# Patient Record
Sex: Male | Born: 1955
Health system: Southern US, Community
[De-identification: ages and names within clinical notes are randomized; demographics above are authoritative.]

## PROBLEM LIST (undated history)

## (undated) DIAGNOSIS — M1990 Unspecified osteoarthritis, unspecified site: Secondary | ICD-10-CM

## (undated) DIAGNOSIS — G709 Myoneural disorder, unspecified: Secondary | ICD-10-CM

## (undated) DIAGNOSIS — J449 Chronic obstructive pulmonary disease, unspecified: Secondary | ICD-10-CM

## (undated) DIAGNOSIS — K5792 Diverticulitis of intestine, part unspecified, without perforation or abscess without bleeding: Secondary | ICD-10-CM

## (undated) DIAGNOSIS — C801 Malignant (primary) neoplasm, unspecified: Secondary | ICD-10-CM

## (undated) DIAGNOSIS — Z87442 Personal history of urinary calculi: Secondary | ICD-10-CM

## (undated) DIAGNOSIS — G629 Polyneuropathy, unspecified: Secondary | ICD-10-CM

## (undated) DIAGNOSIS — J189 Pneumonia, unspecified organism: Secondary | ICD-10-CM

## (undated) DIAGNOSIS — N189 Chronic kidney disease, unspecified: Secondary | ICD-10-CM

## (undated) DIAGNOSIS — Z86010 Personal history of colonic polyps: Secondary | ICD-10-CM

## (undated) DIAGNOSIS — E785 Hyperlipidemia, unspecified: Secondary | ICD-10-CM

## (undated) DIAGNOSIS — I10 Essential (primary) hypertension: Secondary | ICD-10-CM

## (undated) HISTORY — DX: Myoneural disorder, unspecified: G70.9

## (undated) HISTORY — PX: PARTIAL NEPHRECTOMY: SHX414

## (undated) HISTORY — DX: Hyperlipidemia, unspecified: E78.5

## (undated) HISTORY — PX: COLON SURGERY: SHX602

## (undated) HISTORY — PX: COLONOSCOPY: SHX174

## (undated) HISTORY — PX: MOUTH SURGERY: SHX715

## (undated) HISTORY — DX: Personal history of colonic polyps: Z86.010

---

## 2009-11-29 DIAGNOSIS — J189 Pneumonia, unspecified organism: Secondary | ICD-10-CM

## 2009-11-29 HISTORY — DX: Pneumonia, unspecified organism: J18.9

## 2009-11-29 HISTORY — PX: TOOTH EXTRACTION: SUR596

## 2010-07-31 ENCOUNTER — Ambulatory Visit: Payer: Self-pay | Admitting: Internal Medicine

## 2010-07-31 ENCOUNTER — Inpatient Hospital Stay (HOSPITAL_COMMUNITY): Admission: EM | Admit: 2010-07-31 | Discharge: 2010-08-06 | Payer: Self-pay | Admitting: Emergency Medicine

## 2010-08-02 ENCOUNTER — Encounter (INDEPENDENT_AMBULATORY_CARE_PROVIDER_SITE_OTHER): Payer: Self-pay | Admitting: Internal Medicine

## 2010-08-05 ENCOUNTER — Ambulatory Visit: Payer: Self-pay | Admitting: Dentistry

## 2010-08-06 ENCOUNTER — Encounter (INDEPENDENT_AMBULATORY_CARE_PROVIDER_SITE_OTHER): Payer: Self-pay | Admitting: Nurse Practitioner

## 2010-08-10 ENCOUNTER — Telehealth (INDEPENDENT_AMBULATORY_CARE_PROVIDER_SITE_OTHER): Payer: Self-pay | Admitting: Nurse Practitioner

## 2010-08-18 ENCOUNTER — Encounter: Admission: AD | Admit: 2010-08-18 | Discharge: 2010-08-18 | Payer: Self-pay | Admitting: Dentistry

## 2010-08-19 DIAGNOSIS — M1990 Unspecified osteoarthritis, unspecified site: Secondary | ICD-10-CM | POA: Insufficient documentation

## 2010-08-19 DIAGNOSIS — D649 Anemia, unspecified: Secondary | ICD-10-CM | POA: Insufficient documentation

## 2010-08-19 DIAGNOSIS — J852 Abscess of lung without pneumonia: Secondary | ICD-10-CM | POA: Insufficient documentation

## 2010-08-19 DIAGNOSIS — F17200 Nicotine dependence, unspecified, uncomplicated: Secondary | ICD-10-CM | POA: Insufficient documentation

## 2010-08-19 DIAGNOSIS — E8809 Other disorders of plasma-protein metabolism, not elsewhere classified: Secondary | ICD-10-CM | POA: Insufficient documentation

## 2010-08-20 ENCOUNTER — Ambulatory Visit: Payer: Self-pay | Admitting: Internal Medicine

## 2010-08-20 DIAGNOSIS — Z9189 Other specified personal risk factors, not elsewhere classified: Secondary | ICD-10-CM | POA: Insufficient documentation

## 2010-08-20 DIAGNOSIS — R609 Edema, unspecified: Secondary | ICD-10-CM | POA: Insufficient documentation

## 2010-08-21 LAB — CONVERTED CEMR LAB
BUN: 11 mg/dL (ref 6–23)
Basophils Absolute: 0.1 10*3/uL (ref 0.0–0.1)
Basophils Relative: 1.1 % (ref 0.0–3.0)
CO2: 28 meq/L (ref 19–32)
Calcium: 9.2 mg/dL (ref 8.4–10.5)
Chloride: 102 meq/L (ref 96–112)
Creatinine, Ser: 1 mg/dL (ref 0.4–1.5)
Eosinophils Absolute: 0.4 10*3/uL (ref 0.0–0.7)
Eosinophils Relative: 3.5 % (ref 0.0–5.0)
GFR calc non Af Amer: 82.08 mL/min (ref >60)
Glucose, Bld: 84 mg/dL (ref 70–99)
HCT: 37.6 % — ABNORMAL LOW (ref 39.0–52.0)
Hemoglobin: 13.1 g/dL (ref 13.0–17.0)
Lymphocytes Relative: 40.6 % (ref 12.0–46.0)
Lymphs Abs: 4.9 10*3/uL — ABNORMAL HIGH (ref 0.7–4.0)
MCHC: 34.8 g/dL (ref 30.0–36.0)
MCV: 90.3 fL (ref 78.0–100.0)
Monocytes Absolute: 0.9 10*3/uL (ref 0.1–1.0)
Monocytes Relative: 7.2 % (ref 3.0–12.0)
Neutro Abs: 5.8 10*3/uL (ref 1.4–7.7)
Neutrophils Relative %: 47.6 % (ref 43.0–77.0)
Platelets: 547 10*3/uL — ABNORMAL HIGH (ref 150.0–400.0)
Potassium: 5.4 meq/L — ABNORMAL HIGH (ref 3.5–5.1)
RBC: 4.16 M/uL — ABNORMAL LOW (ref 4.22–5.81)
RDW: 16.4 % — ABNORMAL HIGH (ref 11.5–14.6)
Sodium: 139 meq/L (ref 135–145)
WBC: 12.2 10*3/uL — ABNORMAL HIGH (ref 4.5–10.5)

## 2010-08-28 ENCOUNTER — Ambulatory Visit (HOSPITAL_COMMUNITY): Admission: RE | Admit: 2010-08-28 | Discharge: 2010-08-28 | Payer: Self-pay | Admitting: Dentistry

## 2010-09-07 ENCOUNTER — Ambulatory Visit: Payer: Self-pay | Admitting: Internal Medicine

## 2010-10-07 ENCOUNTER — Ambulatory Visit: Payer: Self-pay | Admitting: Dentistry

## 2010-10-20 ENCOUNTER — Ambulatory Visit: Payer: Self-pay | Admitting: Internal Medicine

## 2010-10-20 DIAGNOSIS — I10 Essential (primary) hypertension: Secondary | ICD-10-CM | POA: Insufficient documentation

## 2010-12-29 NOTE — Assessment & Plan Note (Signed)
Summary: Pulmonary/ f/u R lung pna, placed in tickle for repeat at 3 mo   Primary Provider/Referring Provider:  n/a  CC:  Edema- resolved.  History of Present Illness: 54 yowm active smoker admit 07/31/10 with pna ? necrotizing.   Admit 9/2 - 9/8 for  pna sup segment RLL and pos basal  attributed to asp with bad teeth rx with 14 days of augmentin with w/u for edema neg for dvt, nl tsh and bnp but low albumin  August 20, 2010  1st pulmonary office eval Pt c/o bilateral leg edema with redness, minimallyproductive cough now white mucus. Stop aleve Elevate leg higher than heart while sleeping and if not better call for repeat venous doppler on left (call [facility] at [phone])  September 07, 2010 2 wk follow up.  Pt states he is 75-80% better and swelling has resolved. Occ nonprod cough but relates this it recent dental sugery.    October 20, 2010 ov still smoking but no cough or sob or leg swelling. has been told has borderline hbp, uses lots of salt in diet. no cp/ ha  Preventive Screening-Counseling & Management  Alcohol-Tobacco     Smoking Status: current     Smoking Cessation Counseling: yes  Current Medications (verified): 1)  Mucinex 600 Mg Xr12h-Tab (Guaifenesin) .Marland Kitchen.. 1-2 Every 12 Hours As Needed For Cough  Allergies (verified): No Known Drug Allergies  Past History:  Past Medical History: OSTEOARTHRITIS (ICD-715.90) HYPOALBUMINEMIA (ICD-273.8) ANEMIA (ICD-285.9) NICOTINE ADDICTION (ICD-305.1) NECROTIZING PNEUMONIA (ICD-513.0)      - Admit 07/31/10 rx augmentin x 14 days  >> better September 07, 2010       >> October 20, 2010 better but still abn < recheck in 3 months (tickle file) Hospitalized with Encephalitis in third grade Leg swelling     - neg bilateral venous dopplers 08/02/10      Dr. Jolee Ewing, DDS  Vital Signs:  Patient profile:   55 year old male Weight:      296 pounds O2 Sat:      98 % on Room air Temp:     98.1 degrees F oral Pulse rate:   89  / minute BP sitting:   148 / 96  (left arm)  Vitals Entered By: Vernie Murders (October 20, 2010 9:32 AM)  O2 Flow:  Room air  Physical Exam  Additional Exam:  amb wm nad wt  297   August 20, 2010 > 296 October 20, 2010  HEENT mild turbinate edema.  Oropharynx now edentulous,  no thrush or excess pnd or cobblestoning.  No JVD or cervical adenopathy. Mild accessory muscle hypertrophy. Trachea midline, nl thryroid. Chest was hyperinflated by percussion with diminished breath sounds and moderate increased exp time without wheeze. Hoover sign positive at mid inspiration. Regular rate and rhythm without murmur gallop or rub or increase P2 , no edema.   Abd: no hsm, nl excursion. Ext warm without cyanosis or clubbing.     CXR  Procedure date:  10/20/2010  Findings:      Improved right basilar airspace disease.  Impression & Recommendations:  Problem # 1:  NECROTIZING PNEUMONIA (ICD-513.0)  Marked serial improvement, needs f/u in 3 months (placed in tickle file)  Problem # 2:  HYPERTENSION, BENIGN (ICD-401.1)  borderline, admits salt excess.  discussed  Orders: Est. Patient Level II (16109)  Other Orders: T-2 View CXR (71020TC)  Patient Instructions: 1)  Avoid all salt and cigarettes 2)  We will you if further follow  up here is needed 3)  Consider medical followup at [facility]

## 2010-12-29 NOTE — Letter (Signed)
Summary: Discharge Summary  Discharge Summary   Imported By: Arta Bruce 10/19/2010 14:48:04  _____________________________________________________________________  External Attachment:    Type:   Image     Comment:   External Document

## 2010-12-29 NOTE — Assessment & Plan Note (Signed)
Summary: Pulmonary/ post hosp f/u   Visit Type:  Hospital Follow-up Primary Provider/Referring Provider:  n/a  CC:  Pt c/o bilateral leg edema with redness, productive cough, and .  History of Present Illness: 55 yowm active smoker admit 07/31/10 with pna ? necrotizing.   Admit 9/2 - 9/8 for  pna sup segment RLL and pos basal  attributed to asp with bad teeth rx with 14 days of augmentin with w/u for edema neg for dvt, nl tsh and bnp but low albumin  August 20, 2010  1st pulmonary office eval Pt c/o bilateral leg edema with redness, minimallyproductive cough now white mucus.  Pt denies any significant sore throat, dysphagia, itching, sneezing,  nasal congestion or excess secretions,  fever, chills, sweats, unintended wt loss, pleuritic or exertional cp, hempoptysis, change in activity tolerance  orthopnea pnd or leg swelling    Preventive Screening-Counseling & Management  Alcohol-Tobacco     Alcohol drinks/day: 0     Smoking Status: current     Packs/Day: <0.25     Year Started: 1978  Current Medications (verified): 1)  Nicoderm Cq 21 Mg/24hr Pt24 (Nicotine) .... As Directed 2)  Ferrous Sulfate 325 (65 Fe) Mg Tabs (Ferrous Sulfate) .... Take 1 Tablet By Mouth Once A Day 3)  Mucinex 600 Mg Xr12h-Tab (Guaifenesin) .... ****not Taking******take 2 Tabs By Mouth Two Times A Day 4)  Hydrocodone-Acetaminophen 5-325 Mg Tabs (Hydrocodone-Acetaminophen) .... ******not Taking********1 Tab By Mouth Every 4 To 6 Hours As Needed For Pain 5)  Aleve 220 Mg Tabs (Naproxen Sodium) .... As Directed 6)  Tussionex Pennkinetic Er 10-8 Mg/47ml Lqcr (Hydrocod Polst-Chlorphen Polst) .... *********not Taking********5 Cc Every 12 Hours As Needed For Cough 7)  Ventolin Hfa 108 (90 Base) Mcg/act  Aers (Albuterol Sulfate) .... ********not Taking******1-2 Puffs Every 4-6 Hours As Needed  Allergies (verified): No Known Drug Allergies  Past History:  Past Medical History: OSTEOARTHRITIS  (ICD-715.90) HYPOALBUMINEMIA (ICD-273.8) ANEMIA (ICD-285.9) NICOTINE ADDICTION (ICD-305.1) NECROTIZING PNEUMONIA (ICD-513.0)      - Admit 07/31/10 rx augmentin x 14 days  Hospitalized with Encephalitis in third grade Leg swelling     - neg bilateral venous dopplers 08/02/10      Dr. Jolee Ewing, DDS  Past Surgical History: Oral (dental) surgery WISDOM TEETH EXTRACTION, HX OF (ICD-V15.9)  Social History: Smoking Status:  current Packs/Day:  <0.25 Alcohol drinks/day:  0  Vital Signs:  Patient profile:   55 year old male Height:      80.5 inches Weight:      297.25 pounds BMI:     32.37 O2 Sat:      98 % on Room air Pulse rate:   97 / minute BP sitting:   128 / 80  (left arm) Cuff size:   large  Vitals Entered By: Zackery Barefoot CMA (August 20, 2010 2:44 PM)  O2 Flow:  Room air CC: Pt c/o bilateral leg edema with redness, productive cough,  Comments Medications reviewed with patient Verified contact number and pharmacy with patient Zackery Barefoot [facility]  August 20, 2010 2:44 PM    Physical Exam  Additional Exam:  amb wm nad wt  297   August 20, 2010 HEENT mild turbinate edema.  Oropharynx no thrush or excess pnd or cobblestoning.  No JVD or cervical adenopathy. Mild accessory muscle hypertrophy. Trachea midline, nl thryroid. Chest was hyperinflated by percussion with diminished breath sounds and moderate increased exp time without wheeze. Hoover sign positive at mid inspiration. Regular rate and rhythm without  murmur gallop or rub or increase P2  trace edema right, 1-2+ on left.   Abd: no hsm, nl excursion. Ext warm without cyanosis or clubbing.        Sodium                    139 mEq/L                   135-145   Potassium            [H]  5.4 mEq/L                   3.5-5.1   Chloride                  102 mEq/L                   96-112   Carbon Dioxide            28 mEq/L                    19-32   Glucose                   84 mg/dL                     19-14   BUN                       11 mg/dL                    7-82   Creatinine                1.0 mg/dL                   9.5-6.2   Calcium                   9.2 mg/dL                   1.3-08.6   GFR                       82.08 mL/min                >60  Tests: (2) CBC Platelet w/Diff (CBCD)   White Cell Count     [H]  12.2 K/uL                   4.5-10.5   Red Cell Count       [L]  4.16 Mil/uL                 4.22-5.81   Hemoglobin                13.1 g/dL                   57.8-46.9   Hematocrit           [L]  37.6 %                      39.0-52.0   MCV                       90.3 fl  78.0-100.0   MCHC                      34.8 g/dL                   16.1-09.6   RDW                  [H]  16.4 %                      11.5-14.6   Platelet Count       [H]  547.0 K/uL                  150.0-400.0   Neutrophil %              47.6 %                      43.0-77.0   Lymphocyte %              40.6 %                      12.0-46.0   Monocyte %                7.2 %                       3.0-12.0   Eosinophils%              3.5 %                       0.0-5.0   Basophils %               1.1 %                       0.0-3.0   Neutrophill Absolute      5.8 K/uL                    1.4-7.7   Lymphocyte Absolute  [H]  4.9 K/uL                    0.7-4.0   Monocyte Absolute         0.9 K/uL                    0.1-1.0  Eosinophils, Absolute                             0.4 K/uL                    0.0-0.7   Basophils Absolute        0.1 K/uL                    0.0-0.1  CXR  Procedure date:  08/04/2010  Findings:        Improving right lower lobe pulmonary opacities compatible with   previously diagnosed pneumonia, and new right pleural effusion.   Please note radiographic resolution of pneumonia may take 4-6   weeks.   Impression & Recommendations:  Problem # 1:  NECROTIZING PNEUMONIA (ICD-513.0) Gradually improving, needs f/u cxr   Problem # 2:  NICOTINE ADDICTION  (ICD-305.1)  His updated medication list for this problem includes:  Nicoderm Cq 21 Mg/24hr Pt24 (Nicotine) .Marland Kitchen... As directed   I emphasized that although we never turn away smokers from the pulmonary clinic, we do ask that they understand that the recommendations that were made won't work nearly as well in the presence of continued cigarette exposure and we may reach a point where we can't help the patient if he/she can't quit smoking.    Orders: Est. Patient Level III (16010)  Problem # 3:  EDEMA (ICD-782.3) w/u reviewed, prob related to low alb and use of nsaids.  no evidence dvt or renal failure or chf.  See instructions for specific recommendations   Medications Added to Medication List This Visit: 1)  Nicoderm Cq 21 Mg/24hr Pt24 (Nicotine) .... As directed 2)  Ferrous Sulfate 325 (65 Fe) Mg Tabs (Ferrous sulfate) .... Take 1 tablet by mouth once a day 3)  Mucinex 600 Mg Xr12h-tab (Guaifenesin) .Marland Kitchen.. 1-2 every 12 hours as needed for cough 4)  Mucinex 600 Mg Xr12h-tab (Guaifenesin) .... ****not taking******take 2 tabs by mouth two times a day 5)  Hydrocodone-acetaminophen 5-325 Mg Tabs (Hydrocodone-acetaminophen) .... One every 4 hours as needed 6)  Hydrocodone-acetaminophen 5-325 Mg Tabs (Hydrocodone-acetaminophen) .... ******not taking********1 tab by mouth every 4 to 6 hours as needed for pain 7)  Aleve 220 Mg Tabs (Naproxen sodium) .... As directed 8)  Tussionex Pennkinetic Er 10-8 Mg/84ml Lqcr (Hydrocod polst-chlorphen polst) .... *********not taking********5 cc every 12 hours as needed for cough 9)  Ventolin Hfa 108 (90 Base) Mcg/act Aers (Albuterol sulfate) .... ********not taking******1-2 puffs every 4-6 hours as needed  Other Orders: Misc. Referral (Misc. Ref) TLB-BMP (Basic Metabolic Panel-BMET) (80048-METABOL) TLB-CBC Platelet - w/Differential (85025-CBCD)  Patient Instructions: 1)  Stop aleve 2)  Elevate leg higher than heart while sleeping and if not better call for  repeat venous doppler on left (call [facility] at [phone]) 3)  Please schedule a follow-up appointment in 2 weeks, sooner if needed  4)  YOU ARE CLEARED FOR ORAL SURGERY Prescriptions: HYDROCODONE-ACETAMINOPHEN 5-325 MG TABS (HYDROCODONE-ACETAMINOPHEN) one every 4 hours as needed  #30 x 0   Entered and Authorized by:   Nyoka Cowden MD   Signed by:   Nyoka Cowden MD on 08/20/2010   Method used:   Print then Give to Patient   RxID:   [id number]    Not Administered:    Influenza Vaccine not given due to: declined
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
## 2010-12-29 NOTE — Assessment & Plan Note (Signed)
Summary: Pulmonary/ f/u necrotizing pna, improved, in tickle file for 6 w   Primary Provider/Referring Provider:  n/a  CC:  2 wk follow up.  Pt states he is 75-80% better and swelling has resolved. Occ nonprod cough but relates this it recent dental sugery.  Denies SOB, wheezing, and chst tightness.  Marland Kitchen  History of Present Illness: 24 yowm active smoker admit 07/31/10 with pna ? necrotizing.   Admit 9/2 - 9/8 for  pna sup segment RLL and pos basal  attributed to asp with bad teeth rx with 14 days of augmentin with w/u for edema neg for dvt, nl tsh and bnp but low albumin  August 20, 2010  1st pulmonary office eval Pt c/o bilateral leg edema with redness, minimallyproductive cough now white mucus. Stop aleve Elevate leg higher than heart while sleeping and if not better call for repeat venous doppler on left (call Libby at [phone])  September 07, 2010 2 wk follow up.  Pt states he is 75-80% better and swelling has resolved. Occ nonprod cough but relates this it recent dental sugery.  Denies SOB, wheezing, chst tightness.   Not limited by breathing.   Preventive Screening-Counseling & Management  Alcohol-Tobacco     Smoking Status: current     Smoking Cessation Counseling: yes  Current Medications (verified): 1)  Nicoderm Cq 21 Mg/24hr Pt24 (Nicotine) .... As Directed 2)  Ferrous Sulfate 325 (65 Fe) Mg Tabs (Ferrous Sulfate) .... Take 1 Tablet By Mouth Once A Day 3)  Mucinex 600 Mg Xr12h-Tab (Guaifenesin) .Marland Kitchen.. 1-2 Every 12 Hours As Needed For Cough  Allergies (verified): No Known Drug Allergies  Past History:  Past Medical History: OSTEOARTHRITIS (ICD-715.90) HYPOALBUMINEMIA (ICD-273.8) ANEMIA (ICD-285.9) NICOTINE ADDICTION (ICD-305.1) NECROTIZING PNEUMONIA (ICD-513.0)      - Admit 07/31/10 rx augmentin x 14 days  >> better September 07, 2010 but needs 6 week f/u (tickle) Hospitalized with Encephalitis in third grade Leg swelling     - neg bilateral venous dopplers 08/02/10       Dr. Jolee Ewing, DDS  Vital Signs:  Patient profile:   55 year old male Height:      80.5 inches Weight:      292.38 pounds BMI:     31.84 O2 Sat:      99 % on Room air Temp:     98.1 degrees F oral Pulse rate:   72 / minute BP sitting:   112 / 84  (left arm) Cuff size:   large  Vitals Entered By: Gweneth Dimitri RN (September 07, 2010 11:29 AM)  O2 Flow:  Room air CC: 2 wk follow up.  Pt states he is 75-80% better and swelling has resolved. Occ nonprod cough but relates this it recent dental sugery.  Denies SOB, wheezing, chst tightness.   Comments Medications reviewed with patient Daytime contact number verified with patient. Gweneth Dimitri RN  September 07, 2010 11:29 AM    Physical Exam  Additional Exam:  amb wm nad wt  297   August 20, 2010 > 292 HEENT mild turbinate edema.  Oropharynx now edentulous,  no thrush or excess pnd or cobblestoning.  No JVD or cervical adenopathy. Mild accessory muscle hypertrophy. Trachea midline, nl thryroid. Chest was hyperinflated by percussion with diminished breath sounds and moderate increased exp time without wheeze. Hoover sign positive at mid inspiration. Regular rate and rhythm without murmur gallop or rub or increase P2  trace edema right, 1-2+ on left.   Abd: no  hsm, nl excursion. Ext warm without cyanosis or clubbing.     CXR  Procedure date:  09/07/2010  Findings:      Comparison: 08/04/2010 and earlier   Findings: Some interval improvement, but incomplete resolution of right lower lobe airspace densities. Continued follow-up warranted. Left lung remains clear.  No significant pleural fluid.   Heart and vascularity remain normal.   IMPRESSION: Right lower lobe opacities shows some interval improvement, incomplete resolution.  Continued follow-up needed.  Impression & Recommendations:  Problem # 1:  NECROTIZING PNEUMONIA (ICD-513.0) Marked serial improvement, needs f/u in 6 weeks (placed in tickle  file)  Other Orders: T-2 View CXR (71020TC) Est. Patient Level II (16109)  Patient Instructions: 1)  we'll call you the results of your chest xray

## 2011-01-19 ENCOUNTER — Telehealth (INDEPENDENT_AMBULATORY_CARE_PROVIDER_SITE_OTHER): Payer: Self-pay | Admitting: *Deleted

## 2011-01-21 ENCOUNTER — Ambulatory Visit: Payer: Self-pay | Admitting: Internal Medicine

## 2011-01-22 ENCOUNTER — Telehealth: Payer: Self-pay | Admitting: Internal Medicine

## 2011-01-26 NOTE — Progress Notes (Signed)
Summary: 3 month rov with cxr sched  ---- Converted from flag ---- ---- 10/20/2010 6:57 PM, Nyoka Cowden MD wrote: needs f/u ov with cxr ------------------------------  Spoke with pt and notified needs appt with cxr.  Pt okay with this so sched appt for 01/21/11 at 1:45 pm [name]  January 19, 2011 10:53 AM

## 2011-01-26 NOTE — Progress Notes (Signed)
Summary: nos appt  Phone Note Call from Patient   Caller: [email or web address]  Call For: wert Summary of Call: Rsc nos from 2/23 to 3/14. Initial call taken by: Darletta Moll,  January 22, 2011 9:03 AM

## 2011-02-10 ENCOUNTER — Ambulatory Visit: Payer: Self-pay | Admitting: Internal Medicine

## 2011-02-11 ENCOUNTER — Telehealth: Payer: Self-pay | Admitting: Internal Medicine

## 2011-02-11 LAB — CBC
HCT: 28.1 % — ABNORMAL LOW (ref 39.0–52.0)
HCT: 29.9 % — ABNORMAL LOW (ref 39.0–52.0)
HCT: 30.3 % — ABNORMAL LOW (ref 39.0–52.0)
HCT: 31.8 % — ABNORMAL LOW (ref 39.0–52.0)
HCT: 31.9 % — ABNORMAL LOW (ref 39.0–52.0)
HCT: 32.1 % — ABNORMAL LOW (ref 39.0–52.0)
HCT: 33.2 % — ABNORMAL LOW (ref 39.0–52.0)
Hemoglobin: 10.2 g/dL — ABNORMAL LOW (ref 13.0–17.0)
Hemoglobin: 10.5 g/dL — ABNORMAL LOW (ref 13.0–17.0)
Hemoglobin: 10.8 g/dL — ABNORMAL LOW (ref 13.0–17.0)
Hemoglobin: 10.9 g/dL — ABNORMAL LOW (ref 13.0–17.0)
Hemoglobin: 10.9 g/dL — ABNORMAL LOW (ref 13.0–17.0)
Hemoglobin: 11.6 g/dL — ABNORMAL LOW (ref 13.0–17.0)
Hemoglobin: 9.6 g/dL — ABNORMAL LOW (ref 13.0–17.0)
MCH: 30.1 pg (ref 26.0–34.0)
MCH: 30.3 pg (ref 26.0–34.0)
MCH: 30.3 pg (ref 26.0–34.0)
MCH: 30.5 pg (ref 26.0–34.0)
MCH: 30.5 pg (ref 26.0–34.0)
MCH: 30.6 pg (ref 26.0–34.0)
MCH: 30.7 pg (ref 26.0–34.0)
MCHC: 33.8 g/dL (ref 30.0–36.0)
MCHC: 33.9 g/dL (ref 30.0–36.0)
MCHC: 34 g/dL (ref 30.0–36.0)
MCHC: 34.2 g/dL (ref 30.0–36.0)
MCHC: 34.2 g/dL (ref 30.0–36.0)
MCHC: 34.6 g/dL (ref 30.0–36.0)
MCHC: 34.9 g/dL (ref 30.0–36.0)
MCV: 87.6 fL (ref 78.0–100.0)
MCV: 88 fL (ref 78.0–100.0)
MCV: 88.9 fL (ref 78.0–100.0)
MCV: 89 fL (ref 78.0–100.0)
MCV: 89.3 fL (ref 78.0–100.0)
MCV: 89.4 fL (ref 78.0–100.0)
MCV: 89.7 fL (ref 78.0–100.0)
Platelets: 531 10*3/uL — ABNORMAL HIGH (ref 150–400)
Platelets: 549 10*3/uL — ABNORMAL HIGH (ref 150–400)
Platelets: 580 10*3/uL — ABNORMAL HIGH (ref 150–400)
Platelets: 593 10*3/uL — ABNORMAL HIGH (ref 150–400)
Platelets: 607 10*3/uL — ABNORMAL HIGH (ref 150–400)
Platelets: 642 10*3/uL — ABNORMAL HIGH (ref 150–400)
Platelets: 648 10*3/uL — ABNORMAL HIGH (ref 150–400)
RBC: 3.15 MIL/uL — ABNORMAL LOW (ref 4.22–5.81)
RBC: 3.33 MIL/uL — ABNORMAL LOW (ref 4.22–5.81)
RBC: 3.45 MIL/uL — ABNORMAL LOW (ref 4.22–5.81)
RBC: 3.57 MIL/uL — ABNORMAL LOW (ref 4.22–5.81)
RBC: 3.57 MIL/uL — ABNORMAL LOW (ref 4.22–5.81)
RBC: 3.59 MIL/uL — ABNORMAL LOW (ref 4.22–5.81)
RBC: 3.77 MIL/uL — ABNORMAL LOW (ref 4.22–5.81)
RDW: 14 % (ref 11.5–15.5)
RDW: 14.1 % (ref 11.5–15.5)
RDW: 14.3 % (ref 11.5–15.5)
RDW: 14.3 % (ref 11.5–15.5)
RDW: 14.5 % (ref 11.5–15.5)
RDW: 14.5 % (ref 11.5–15.5)
RDW: 14.7 % (ref 11.5–15.5)
WBC: 10.1 10*3/uL (ref 4.0–10.5)
WBC: 12.5 10*3/uL — ABNORMAL HIGH (ref 4.0–10.5)
WBC: 12.8 10*3/uL — ABNORMAL HIGH (ref 4.0–10.5)
WBC: 14.3 10*3/uL — ABNORMAL HIGH (ref 4.0–10.5)
WBC: 15.9 10*3/uL — ABNORMAL HIGH (ref 4.0–10.5)
WBC: 17 10*3/uL — ABNORMAL HIGH (ref 4.0–10.5)
WBC: 19.7 10*3/uL — ABNORMAL HIGH (ref 4.0–10.5)

## 2011-02-11 LAB — BASIC METABOLIC PANEL
BUN: 4 mg/dL — ABNORMAL LOW (ref 6–23)
BUN: 6 mg/dL (ref 6–23)
BUN: 6 mg/dL (ref 6–23)
BUN: 7 mg/dL (ref 6–23)
BUN: 8 mg/dL (ref 6–23)
CO2: 29 mEq/L (ref 19–32)
CO2: 29 mEq/L (ref 19–32)
CO2: 30 mEq/L (ref 19–32)
CO2: 30 mEq/L (ref 19–32)
CO2: 30 mEq/L (ref 19–32)
Calcium: 7.8 mg/dL — ABNORMAL LOW (ref 8.4–10.5)
Calcium: 7.9 mg/dL — ABNORMAL LOW (ref 8.4–10.5)
Calcium: 8.1 mg/dL — ABNORMAL LOW (ref 8.4–10.5)
Calcium: 8.2 mg/dL — ABNORMAL LOW (ref 8.4–10.5)
Calcium: 8.2 mg/dL — ABNORMAL LOW (ref 8.4–10.5)
Chloride: 100 mEq/L (ref 96–112)
Chloride: 103 mEq/L (ref 96–112)
Chloride: 103 mEq/L (ref 96–112)
Chloride: 91 mEq/L — ABNORMAL LOW (ref 96–112)
Chloride: 94 mEq/L — ABNORMAL LOW (ref 96–112)
Creatinine, Ser: 0.73 mg/dL (ref 0.4–1.5)
Creatinine, Ser: 0.76 mg/dL (ref 0.4–1.5)
Creatinine, Ser: 0.79 mg/dL (ref 0.4–1.5)
Creatinine, Ser: 0.8 mg/dL (ref 0.4–1.5)
Creatinine, Ser: 0.81 mg/dL (ref 0.4–1.5)
GFR calc Af Amer: >60 mL/min (ref >60)
GFR calc Af Amer: >60 mL/min (ref >60)
GFR calc Af Amer: >60 mL/min (ref >60)
GFR calc Af Amer: >60 mL/min (ref >60)
GFR calc Af Amer: >60 mL/min (ref >60)
GFR calc non Af Amer: >60 mL/min (ref >60)
GFR calc non Af Amer: >60 mL/min (ref >60)
GFR calc non Af Amer: >60 mL/min (ref >60)
GFR calc non Af Amer: >60 mL/min (ref >60)
GFR calc non Af Amer: >60 mL/min (ref >60)
Glucose, Bld: 107 mg/dL — ABNORMAL HIGH (ref 70–99)
Glucose, Bld: 110 mg/dL — ABNORMAL HIGH (ref 70–99)
Glucose, Bld: 114 mg/dL — ABNORMAL HIGH (ref 70–99)
Glucose, Bld: 117 mg/dL — ABNORMAL HIGH (ref 70–99)
Glucose, Bld: 96 mg/dL (ref 70–99)
Potassium: 3.2 mEq/L — ABNORMAL LOW (ref 3.5–5.1)
Potassium: 3.3 mEq/L — ABNORMAL LOW (ref 3.5–5.1)
Potassium: 3.5 mEq/L (ref 3.5–5.1)
Potassium: 3.6 mEq/L (ref 3.5–5.1)
Potassium: 3.6 mEq/L (ref 3.5–5.1)
Sodium: 130 mEq/L — ABNORMAL LOW (ref 135–145)
Sodium: 132 mEq/L — ABNORMAL LOW (ref 135–145)
Sodium: 137 mEq/L (ref 135–145)
Sodium: 137 mEq/L (ref 135–145)
Sodium: 140 mEq/L (ref 135–145)

## 2011-02-11 LAB — DIFFERENTIAL
Basophils Absolute: 0.1 10*3/uL (ref 0.0–0.1)
Basophils Absolute: 0.1 10*3/uL (ref 0.0–0.1)
Basophils Absolute: 0.2 10*3/uL — ABNORMAL HIGH (ref 0.0–0.1)
Basophils Absolute: 0.2 10*3/uL — ABNORMAL HIGH (ref 0.0–0.1)
Basophils Relative: 1 % (ref 0–1)
Basophils Relative: 1 % (ref 0–1)
Basophils Relative: 1 % (ref 0–1)
Basophils Relative: 2 % — ABNORMAL HIGH (ref 0–1)
Eosinophils Absolute: 0.1 10*3/uL (ref 0.0–0.7)
Eosinophils Absolute: 0.2 10*3/uL (ref 0.0–0.7)
Eosinophils Absolute: 0.3 10*3/uL (ref 0.0–0.7)
Eosinophils Absolute: 0.4 10*3/uL (ref 0.0–0.7)
Eosinophils Relative: 0 % (ref 0–5)
Eosinophils Relative: 1 % (ref 0–5)
Eosinophils Relative: 2 % (ref 0–5)
Eosinophils Relative: 3 % (ref 0–5)
Lymphocytes Relative: 17 % (ref 12–46)
Lymphocytes Relative: 17 % (ref 12–46)
Lymphocytes Relative: 20 % (ref 12–46)
Lymphocytes Relative: 23 % (ref 12–46)
Lymphs Abs: 2.2 10*3/uL (ref 0.7–4.0)
Lymphs Abs: 2.4 10*3/uL (ref 0.7–4.0)
Lymphs Abs: 3.2 10*3/uL (ref 0.7–4.0)
Lymphs Abs: 4.6 10*3/uL — ABNORMAL HIGH (ref 0.7–4.0)
Monocytes Absolute: 0.6 10*3/uL (ref 0.1–1.0)
Monocytes Absolute: 0.7 10*3/uL (ref 0.1–1.0)
Monocytes Absolute: 0.9 10*3/uL (ref 0.1–1.0)
Monocytes Absolute: 1.2 10*3/uL — ABNORMAL HIGH (ref 0.1–1.0)
Monocytes Relative: 5 % (ref 3–12)
Monocytes Relative: 5 % (ref 3–12)
Monocytes Relative: 6 % (ref 3–12)
Monocytes Relative: 6 % (ref 3–12)
Neutro Abs: 10.6 10*3/uL — ABNORMAL HIGH (ref 1.7–7.7)
Neutro Abs: 11.3 10*3/uL — ABNORMAL HIGH (ref 1.7–7.7)
Neutro Abs: 13.7 10*3/uL — ABNORMAL HIGH (ref 1.7–7.7)
Neutro Abs: 9.6 10*3/uL — ABNORMAL HIGH (ref 1.7–7.7)
Neutrophils Relative %: 70 % (ref 43–77)
Neutrophils Relative %: 71 % (ref 43–77)
Neutrophils Relative %: 74 % (ref 43–77)
Neutrophils Relative %: 75 % (ref 43–77)

## 2011-02-11 LAB — CULTURE, RESPIRATORY: Culture: NORMAL

## 2011-02-11 LAB — COMPREHENSIVE METABOLIC PANEL
ALT: 32 U/L (ref 0–53)
AST: 40 U/L — ABNORMAL HIGH (ref 0–37)
Albumin: 1.7 g/dL — ABNORMAL LOW (ref 3.5–5.2)
Alkaline Phosphatase: 169 U/L — ABNORMAL HIGH (ref 39–117)
BUN: 6 mg/dL (ref 6–23)
CO2: 30 mEq/L (ref 19–32)
Calcium: 8 mg/dL — ABNORMAL LOW (ref 8.4–10.5)
Chloride: 100 mEq/L (ref 96–112)
Creatinine, Ser: 0.62 mg/dL (ref 0.4–1.5)
GFR calc Af Amer: >60 mL/min (ref >60)
GFR calc non Af Amer: >60 mL/min (ref >60)
Glucose, Bld: 110 mg/dL — ABNORMAL HIGH (ref 70–99)
Potassium: 3.7 mEq/L (ref 3.5–5.1)
Sodium: 136 mEq/L (ref 135–145)
Total Bilirubin: 0.6 mg/dL (ref 0.3–1.2)
Total Protein: 6.8 g/dL (ref 6.0–8.3)

## 2011-02-11 LAB — IRON AND TIBC
Iron: 25 ug/dL — ABNORMAL LOW (ref 42–135)
Saturation Ratios: 21 % (ref 20–55)
TIBC: 120 ug/dL — ABNORMAL LOW (ref 215–435)
UIBC: 95 ug/dL

## 2011-02-11 LAB — EXPECTORATED SPUTUM ASSESSMENT W REFEX TO RESP CULTURE

## 2011-02-11 LAB — AFB CULTURE WITH SMEAR (NOT AT ARMC)
Acid Fast Smear: NONE SEEN
Acid Fast Smear: NONE SEEN

## 2011-02-11 LAB — LEGIONELLA ANTIGEN, URINE: Legionella Antigen, Urine: NEGATIVE

## 2011-02-11 LAB — BRAIN NATRIURETIC PEPTIDE: Pro B Natriuretic peptide (BNP): 44.5 pg/mL (ref 0.0–100.0)

## 2011-02-11 LAB — RETICULOCYTES
RBC.: 3.52 MIL/uL — ABNORMAL LOW (ref 4.22–5.81)
Retic Count, Absolute: 45.8 10*3/uL (ref 19.0–186.0)
Retic Ct Pct: 1.3 % (ref 0.4–3.1)

## 2011-02-11 LAB — D-DIMER, QUANTITATIVE: D-Dimer, Quant: 2.54 ug/mL-FEU — ABNORMAL HIGH (ref 0.00–0.48)

## 2011-02-11 LAB — URINALYSIS, ROUTINE W REFLEX MICROSCOPIC
Glucose, UA: NEGATIVE mg/dL
Hgb urine dipstick: NEGATIVE
Ketones, ur: NEGATIVE mg/dL
Nitrite: NEGATIVE
Protein, ur: NEGATIVE mg/dL
Specific Gravity, Urine: 1.019 (ref 1.005–1.030)
Urobilinogen, UA: 1 mg/dL (ref 0.0–1.0)
pH: 5.5 (ref 5.0–8.0)

## 2011-02-11 LAB — URINE CULTURE
Colony Count: NO GROWTH
Culture  Setup Time: 201109030123
Culture: NO GROWTH

## 2011-02-11 LAB — EXPECTORATED SPUTUM ASSESSMENT W GRAM STAIN, RFLX TO RESP C

## 2011-02-11 LAB — CULTURE, RESPIRATORY W GRAM STAIN
Culture: NORMAL
Culture: NORMAL

## 2011-02-11 LAB — POCT CARDIAC MARKERS
CKMB, poc: <1 ng/mL — ABNORMAL LOW (ref 1.0–8.0)
Myoglobin, poc: 111 ng/mL (ref 12–200)
Troponin i, poc: <0.05 ng/mL (ref 0.00–0.09)

## 2011-02-11 LAB — VANCOMYCIN, TROUGH: Vancomycin Tr: 24.8 ug/mL — ABNORMAL HIGH (ref 10.0–20.0)

## 2011-02-11 LAB — TSH: TSH: 1.161 u[IU]/mL (ref 0.350–4.500)

## 2011-02-11 LAB — FOLATE: Folate: 3.2 ng/mL — ABNORMAL LOW

## 2011-02-11 LAB — HIV ANTIBODY (ROUTINE TESTING W REFLEX): HIV: NONREACTIVE

## 2011-02-11 LAB — FERRITIN: Ferritin: 870 ng/mL — ABNORMAL HIGH (ref 22–322)

## 2011-02-11 LAB — PREALBUMIN: Prealbumin: 4.3 mg/dL — ABNORMAL LOW (ref 18.0–45.0)

## 2011-02-11 LAB — VITAMIN B12: Vitamin B-12: 518 pg/mL (ref 211–911)

## 2011-02-11 LAB — MAGNESIUM: Magnesium: 2.2 mg/dL (ref 1.5–2.5)

## 2011-02-11 LAB — LACTIC ACID, PLASMA: Lactic Acid, Venous: 2 mmol/L (ref 0.5–2.2)

## 2011-02-11 LAB — HEMOCCULT GUIAC POC 1CARD (OFFICE): Fecal Occult Bld: NEGATIVE

## 2011-02-11 LAB — STREP PNEUMONIAE URINARY ANTIGEN: Strep Pneumo Urinary Antigen: NEGATIVE

## 2011-02-16 NOTE — Progress Notes (Signed)
Summary: nos appt  Phone Note Call from Patient   Caller: [email or web address]  Call For: wert Summary of Call: ATC pt to rsc nos from 3/14 no vm. Initial call taken by: Darletta Moll,  February 11, 2011 10:34 AM

## 2012-07-04 ENCOUNTER — Inpatient Hospital Stay (HOSPITAL_COMMUNITY)
Admission: EM | Admit: 2012-07-04 | Discharge: 2012-07-09 | DRG: 603 | Disposition: A | Discharge status: Home / Self Care | Payer: MEDICAID | Attending: Internal Medicine | Admitting: Internal Medicine

## 2012-07-04 ENCOUNTER — Inpatient Hospital Stay (HOSPITAL_COMMUNITY): Payer: Self-pay

## 2012-07-04 ENCOUNTER — Encounter (HOSPITAL_COMMUNITY): Payer: Self-pay | Admitting: *Deleted

## 2012-07-04 DIAGNOSIS — F17200 Nicotine dependence, unspecified, uncomplicated: Secondary | ICD-10-CM | POA: Diagnosis present

## 2012-07-04 DIAGNOSIS — L0390 Cellulitis, unspecified: Secondary | ICD-10-CM | POA: Diagnosis present

## 2012-07-04 DIAGNOSIS — L02419 Cutaneous abscess of limb, unspecified: Principal | ICD-10-CM | POA: Diagnosis present

## 2012-07-04 DIAGNOSIS — R739 Hyperglycemia, unspecified: Secondary | ICD-10-CM

## 2012-07-04 DIAGNOSIS — L0291 Cutaneous abscess, unspecified: Secondary | ICD-10-CM

## 2012-07-04 DIAGNOSIS — G589 Mononeuropathy, unspecified: Secondary | ICD-10-CM

## 2012-07-04 DIAGNOSIS — G629 Polyneuropathy, unspecified: Secondary | ICD-10-CM | POA: Diagnosis present

## 2012-07-04 DIAGNOSIS — L97509 Non-pressure chronic ulcer of other part of unspecified foot with unspecified severity: Secondary | ICD-10-CM | POA: Diagnosis present

## 2012-07-04 DIAGNOSIS — I10 Essential (primary) hypertension: Secondary | ICD-10-CM | POA: Diagnosis present

## 2012-07-04 DIAGNOSIS — R7309 Other abnormal glucose: Secondary | ICD-10-CM | POA: Diagnosis present

## 2012-07-04 DIAGNOSIS — J852 Abscess of lung without pneumonia: Secondary | ICD-10-CM

## 2012-07-04 DIAGNOSIS — Z9189 Other specified personal risk factors, not elsewhere classified: Secondary | ICD-10-CM

## 2012-07-04 DIAGNOSIS — E8809 Other disorders of plasma-protein metabolism, not elsewhere classified: Secondary | ICD-10-CM

## 2012-07-04 DIAGNOSIS — B351 Tinea unguium: Secondary | ICD-10-CM | POA: Diagnosis present

## 2012-07-04 DIAGNOSIS — G5790 Unspecified mononeuropathy of unspecified lower limb: Secondary | ICD-10-CM | POA: Diagnosis present

## 2012-07-04 DIAGNOSIS — M1990 Unspecified osteoarthritis, unspecified site: Secondary | ICD-10-CM

## 2012-07-04 DIAGNOSIS — D649 Anemia, unspecified: Secondary | ICD-10-CM

## 2012-07-04 DIAGNOSIS — B070 Plantar wart: Secondary | ICD-10-CM | POA: Diagnosis present

## 2012-07-04 DIAGNOSIS — Z23 Encounter for immunization: Secondary | ICD-10-CM

## 2012-07-04 DIAGNOSIS — L539 Erythematous condition, unspecified: Secondary | ICD-10-CM | POA: Diagnosis present

## 2012-07-04 DIAGNOSIS — R609 Edema, unspecified: Secondary | ICD-10-CM | POA: Diagnosis present

## 2012-07-04 HISTORY — DX: Polyneuropathy, unspecified: G62.9

## 2012-07-04 HISTORY — DX: Essential (primary) hypertension: I10

## 2012-07-04 HISTORY — DX: Pneumonia, unspecified organism: J18.9

## 2012-07-04 LAB — CBC WITH DIFFERENTIAL/PLATELET
Basophils Absolute: 0 10*3/uL (ref 0.0–0.1)
Basophils Relative: 0 % (ref 0–1)
Eosinophils Absolute: 0 10*3/uL (ref 0.0–0.7)
Eosinophils Relative: 0 % (ref 0–5)
HCT: 47.8 % (ref 39.0–52.0)
Hemoglobin: 16.6 g/dL (ref 13.0–17.0)
Lymphocytes Relative: 21 % (ref 12–46)
Lymphs Abs: 1.9 10*3/uL (ref 0.7–4.0)
MCH: 34.5 pg — ABNORMAL HIGH (ref 26.0–34.0)
MCHC: 34.7 g/dL (ref 30.0–36.0)
MCV: 99.4 fL (ref 78.0–100.0)
Monocytes Absolute: 1.1 10*3/uL — ABNORMAL HIGH (ref 0.1–1.0)
Monocytes Relative: 12 % (ref 3–12)
Neutro Abs: 5.8 10*3/uL (ref 1.7–7.7)
Neutrophils Relative %: 66 % (ref 43–77)
Platelets: 257 10*3/uL (ref 150–400)
RBC: 4.81 MIL/uL (ref 4.22–5.81)
RDW: 13.6 % (ref 11.5–15.5)
WBC: 8.7 10*3/uL (ref 4.0–10.5)

## 2012-07-04 LAB — HEPATIC FUNCTION PANEL
ALT: 24 U/L (ref 0–53)
AST: 28 U/L (ref 0–37)
Albumin: 3 g/dL — ABNORMAL LOW (ref 3.5–5.2)
Alkaline Phosphatase: 122 U/L — ABNORMAL HIGH (ref 39–117)
Bilirubin, Direct: 0.1 mg/dL (ref 0.0–0.3)
Indirect Bilirubin: 0.2 mg/dL — ABNORMAL LOW (ref 0.3–0.9)
Total Bilirubin: 0.3 mg/dL (ref 0.3–1.2)
Total Protein: 7.6 g/dL (ref 6.0–8.3)

## 2012-07-04 LAB — BASIC METABOLIC PANEL
BUN: 9 mg/dL (ref 6–23)
CO2: 26 mEq/L (ref 19–32)
Calcium: 9.2 mg/dL (ref 8.4–10.5)
Chloride: 98 mEq/L (ref 96–112)
Creatinine, Ser: 0.93 mg/dL (ref 0.50–1.35)
GFR calc Af Amer: >90 mL/min (ref >90)
GFR calc non Af Amer: >90 mL/min (ref >90)
Glucose, Bld: 110 mg/dL — ABNORMAL HIGH (ref 70–99)
Potassium: 3.6 mEq/L (ref 3.5–5.1)
Sodium: 134 mEq/L — ABNORMAL LOW (ref 135–145)

## 2012-07-04 LAB — PHOSPHORUS: Phosphorus: 3.6 mg/dL (ref 2.3–4.6)

## 2012-07-04 LAB — SEDIMENTATION RATE: Sed Rate: 80 mm/hr — ABNORMAL HIGH (ref 0–16)

## 2012-07-04 LAB — MAGNESIUM: Magnesium: 2.4 mg/dL (ref 1.5–2.5)

## 2012-07-04 MED ORDER — NICOTINE 14 MG/24HR TD PT24
14.0000 mg | MEDICATED_PATCH | Freq: Every day | TRANSDERMAL | Status: DC
  Administered 2012-07-04 – 2012-07-09 (×6): 14 mg via TRANSDERMAL
  Filled 2012-07-04 (×6): qty 1

## 2012-07-04 MED ORDER — SODIUM CHLORIDE 0.9 % IV SOLN: INTRAVENOUS | Status: DC

## 2012-07-04 MED ORDER — ACETAMINOPHEN 650 MG RE SUPP: 650.0000 mg | Freq: Four times a day (QID) | RECTAL | Status: DC | PRN

## 2012-07-04 MED ORDER — ONDANSETRON HCL 4 MG PO TABS: 4.0000 mg | ORAL_TABLET | Freq: Four times a day (QID) | ORAL | Status: DC | PRN

## 2012-07-04 MED ORDER — ACETAMINOPHEN 325 MG PO TABS: 650.0000 mg | ORAL_TABLET | Freq: Four times a day (QID) | ORAL | Status: DC | PRN

## 2012-07-04 MED ORDER — ZOLPIDEM TARTRATE 5 MG PO TABS
5.0000 mg | ORAL_TABLET | Freq: Every evening | ORAL | Status: AC | PRN
  Administered 2012-07-04 – 2012-07-05 (×2): 5 mg via ORAL
  Filled 2012-07-04 (×3): qty 1

## 2012-07-04 MED ORDER — ENOXAPARIN SODIUM 40 MG/0.4ML ~~LOC~~ SOLN
40.0000 mg | SUBCUTANEOUS | Status: DC
  Administered 2012-07-04 – 2012-07-07 (×4): 40 mg via SUBCUTANEOUS
  Filled 2012-07-04 (×5): qty 0.4

## 2012-07-04 MED ORDER — HYDROMORPHONE HCL PF 1 MG/ML IJ SOLN
1.0000 mg | INTRAMUSCULAR | Status: DC | PRN
  Administered 2012-07-04 – 2012-07-09 (×20): 1 mg via INTRAVENOUS
  Filled 2012-07-04 (×20): qty 1

## 2012-07-04 MED ORDER — PNEUMOCOCCAL VAC POLYVALENT 25 MCG/0.5ML IJ INJ
0.5000 mL | INJECTION | INTRAMUSCULAR | Status: AC
  Administered 2012-07-05: 0.5 mL via INTRAMUSCULAR
  Filled 2012-07-04: qty 0.5

## 2012-07-04 MED ORDER — HYDROMORPHONE HCL PF 1 MG/ML IJ SOLN
1.0000 mg | INTRAMUSCULAR | Status: DC | PRN
  Administered 2012-07-04: 1 mg via INTRAVENOUS
  Filled 2012-07-04: qty 1

## 2012-07-04 MED ORDER — SODIUM CHLORIDE 0.9 % IV SOLN
INTRAVENOUS | Status: DC
  Administered 2012-07-04: 12:00:00 via INTRAVENOUS

## 2012-07-04 MED ORDER — VANCOMYCIN HCL IN DEXTROSE 1-5 GM/200ML-% IV SOLN
1000.0000 mg | Freq: Once | INTRAVENOUS | Status: AC
  Administered 2012-07-04: 1000 mg via INTRAVENOUS
  Filled 2012-07-04: qty 200

## 2012-07-04 MED ORDER — OXYCODONE HCL 5 MG PO TABS
5.0000 mg | ORAL_TABLET | ORAL | Status: DC | PRN
  Administered 2012-07-05 – 2012-07-07 (×5): 5 mg via ORAL
  Filled 2012-07-04 (×5): qty 1

## 2012-07-04 MED ORDER — SODIUM CHLORIDE 0.9 % IV SOLN
2500.0000 mg | Freq: Once | INTRAVENOUS | Status: DC
  Filled 2012-07-04: qty 2500

## 2012-07-04 MED ORDER — ZOLPIDEM TARTRATE 5 MG PO TABS: 5.0000 mg | ORAL_TABLET | Freq: Every evening | ORAL | Status: DC | PRN

## 2012-07-04 MED ORDER — MORPHINE SULFATE 4 MG/ML IJ SOLN
4.0000 mg | Freq: Once | INTRAMUSCULAR | Status: AC
  Administered 2012-07-04: 4 mg via INTRAVENOUS
  Filled 2012-07-04: qty 1

## 2012-07-04 MED ORDER — VANCOMYCIN HCL 1000 MG IV SOLR
1250.0000 mg | Freq: Two times a day (BID) | INTRAVENOUS | Status: DC
  Administered 2012-07-05 – 2012-07-09 (×10): 1250 mg via INTRAVENOUS
  Filled 2012-07-04 (×10): qty 1250

## 2012-07-04 MED ORDER — SODIUM CHLORIDE 0.9 % IV SOLN
INTRAVENOUS | Status: AC
  Administered 2012-07-04: 18:00:00 via INTRAVENOUS

## 2012-07-04 MED ORDER — VANCOMYCIN HCL 1000 MG IV SOLR
1500.0000 mg | Freq: Once | INTRAVENOUS | Status: AC
  Administered 2012-07-04: 1500 mg via INTRAVENOUS
  Filled 2012-07-04: qty 1500

## 2012-07-04 MED ORDER — ONDANSETRON HCL 4 MG/2ML IJ SOLN: 4.0000 mg | Freq: Four times a day (QID) | INTRAMUSCULAR | Status: DC | PRN

## 2012-07-04 NOTE — ED Notes (Signed)
B/L side of large toes, right with black, discolored growth-left toe bandaged, patient states "worse compared to right toe"-patient states they bleed with ambulation-has seen podiatrists in past but has not followed up with their recomendations

## 2012-07-04 NOTE — Progress Notes (Addendum)
ANTIBIOTIC CONSULT NOTE - INITIAL  Pharmacy Consult for Vancomycin Indication: Cellulitis  No Known Allergies  Patient Measurements: Height: 6\' 8"  (203.2 cm) Weight: 280 lb (127.007 kg) IBW/kg (Calculated) : 96    Vital Signs: Temp: 98.6 F (37 C) (08/06 1508) Temp src: Oral (08/06 1508) BP: 120/90 mmHg (08/06 1508) Pulse Rate: 98  (08/06 1508) Intake/Output from previous day:   Intake/Output from this shift:    Labs:  Basename 07/04/12 1200  WBC 8.7  HGB 16.6  PLT 257  LABCREA --  CREATININE 0.93   Estimated Creatinine Clearance: 137.6 ml/min (by C-G formula based on Cr of 0.93). No results found for this basename: VANCOTROUGH:2,VANCOPEAK:2,VANCORANDOM:2,GENTTROUGH:2,GENTPEAK:2,GENTRANDOM:2,TOBRATROUGH:2,TOBRAPEAK:2,TOBRARND:2,AMIKACINPEAK:2,AMIKACINTROU:2,AMIKACIN:2, in the last 72 hours   Microbiology: No results found for this or any previous visit (from the past 720 hour(s)).  Medical History: Past Medical History  Diagnosis Date  . Hypertension   . Pneumonia 2011  . Neuropathy     in both feet    Medications:  Scheduled:    . enoxaparin (LOVENOX) injection  40 mg Subcutaneous Q24H  .  morphine injection  4 mg Intravenous Once  . nicotine  14 mg Transdermal Daily  . pneumococcal 23 valent vaccine  0.5 mL Intramuscular Tomorrow-1000  . vancomycin  1,000 mg Intravenous Once  . DISCONTD: sodium chloride   Intravenous STAT   Infusions:    . sodium chloride    . DISCONTD: sodium chloride 75 mL/hr at 07/04/12 1356   Assessment:  56 year old male lower extremity redness, swelling and pain  Received Vancomycin 1000mg  IV x 1 in ED @ 12:44 today  IV Vancomycin to continue for cellulitis  Goal of Therapy:  Vancomycin trough level 10-15 mcg/ml  Plan:   Vancomycin 1500 mg x 1 for total initial dose of 2500mg  (15 mg/kg) then continue with Vancomycin 1250mg  IV q12h  Follow renal function, culture data  Check vancomycin trough levels as  appropriate  Keerthana Vanrossum, Joselyn Glassman, PharmD 07/04/2012,3:54 PM

## 2012-07-04 NOTE — ED Notes (Signed)
hospitalist at bedside

## 2012-07-04 NOTE — ED Notes (Signed)
Report to [place]-transfer to 1321

## 2012-07-04 NOTE — Progress Notes (Signed)
UR done. 

## 2012-07-04 NOTE — ED Provider Notes (Signed)
History     CSN: [id number]  Arrival date & time 07/04/12  1034   First MD Initiated Contact with Patient 07/04/12 1112      Chief Complaint  Patient presents with  . Cellulitis    (Consider location/radiation/quality/duration/timing/severity/associated sxs/prior treatment) The history is provided by the patient.   patient here with left lower extremity erythema x48 hours as progressively worse up his left thigh. Patient with history of cellulitis and this is similar. Denies any fever, vomiting. Does note increased pain with walking. Denies any new trauma.  Past Medical History  Diagnosis Date  . Hypertension     Past Surgical History  Procedure Date  . Tooth extraction     No family history on file.  History  Substance Use Topics  . Smoking status: Current Everyday Smoker -- 0.5 packs/day  . Smokeless tobacco: Not on file  . Alcohol Use: Yes     ocassionally      Review of Systems  All other systems reviewed and are negative.    Allergies  Review of patient's allergies indicates no known allergies.  Home Medications  No current outpatient prescriptions on file.  BP 123/93  Pulse 102  Temp 98.3 F (36.8 C) (Oral)  Resp 18  Wt 280 lb (127.007 kg)  SpO2 99%  Physical Exam  Nursing note and vitals reviewed. Constitutional: He is oriented to person, place, and time. He appears well-developed and well-nourished.  Non-toxic appearance. No distress.  HENT:  Head: Normocephalic and atraumatic.  Eyes: Conjunctivae, EOM and lids are normal. Pupils are equal, round, and reactive to light.  Neck: Normal range of motion. Neck supple. No tracheal deviation present. No mass present.  Cardiovascular: Regular rhythm and normal heart sounds.  Tachycardia present.  Exam reveals no gallop.   No murmur heard. Pulmonary/Chest: Effort normal and breath sounds normal. No stridor. No respiratory distress. He has no decreased breath sounds. He has no wheezes. He has no  rhonchi. He has no rales.  Abdominal: Soft. Normal appearance and bowel sounds are normal. He exhibits no distension. There is no tenderness. There is no rebound and no CVA tenderness.  Musculoskeletal: Normal range of motion. He exhibits no edema and no tenderness.       Left le edema and erythema with lymphangitis  verroucous lesions to bilateral great toes  Neurological: He is alert and oriented to person, place, and time. He has normal strength. No cranial nerve deficit or sensory deficit. GCS eye subscore is 4. GCS verbal subscore is 5. GCS motor subscore is 6.  Skin: Skin is warm and dry. No abrasion and no rash noted.  Psychiatric: He has a normal mood and affect. His speech is normal and behavior is normal.    ED Course  Procedures (including critical care time)   Labs Reviewed  CBC WITH DIFFERENTIAL  BASIC METABOLIC PANEL   No results found.   No diagnosis found.    MDM  Pt started on abx, will be admitted to triad        Toy Baker, MD 07/04/12 1313

## 2012-07-04 NOTE — H&P (Signed)
[facility] History and Physical  [name] ZOX:[id number] DOB: [birth year] DOA: 07/04/2012  Referring physician: Dr. Freida Busman PCP: No primary provider on file.   Chief Complaint: fever, LLE swelling, redness and pain  HPI:  56y/o male with PMH od tobacco abuse, neuropathy in both foot and necrotizing PNA (in 2011); came to the hospital complaining of 2-3 days worsening pain, swelling and erythema on his LLE. Patient reports associated fever at home. He denies any injury or insect bites; but on exam had bilateral chronic open wound on his big toes and onychomycosis. He denies any CP, SOB, nausea, vomiting or any other complaints. Due to rapidly progression of his erythema/swelling and untreated open foot wounds; [facility] has been called to admit patient for further evaluation and treatment.  Review of Systems:  Negative except as mentioned on HPI.  Past Medical History  Diagnosis Date  . Hypertension   . Pneumonia 2011  . Neuropathy     in both feet   Past Surgical History  Procedure Date  . Tooth extraction 2011    multiple teeth extracted   Social History:  reports that he has been smoking.  He has never used smokeless tobacco. He reports that he drinks alcohol. He reports that he does not use illicit drugs. Came from home able to perform ADL's w/o assistance.  No Known Allergies  Family history: Father with diabetes; otherwise no other family hx of significance.    Prior to Admission medications   Not on File   Physical Exam: Filed Vitals:   07/04/12 1054 07/04/12 1508  BP: 123/93 120/90  Pulse: 102 98  Temp: 98.3 F (36.8 C) 98.6 F (37 C)  TempSrc: Oral Oral  Resp: 18 18  Height:  6\' 8"  (2.032 m)  Weight: 127.007 kg (280 lb) 127.007 kg (280 lb)  SpO2: 99% 99%     General:  NAD  Eyes: PERLL, EOMI, no icterus.  ENT: No ears or nostrils discharges; throat w/o erythema or exudates.  Neck: supple, no thyromegaly  Cardiovascular: S1 and S2; no rubs or  gallops; mild tachycardia  Respiratory: CTA bilaterally  Abdomen: Soft, NT, ND, positive BS  Skin: cellulitis changes on his LLE, open wounds on bilateral toes.  Extremities: swelling, erythema and tenderness on LLE due to cellulitis; also with bilateral big toes chronic wounds (dry skin/induration; mild tenderness to palpation and bleeding when applying pressure on them on ambulation)  Psychiatric: Stable, no SI  Neurologic: AAOX3; no focal deficit; CN intact  Labs on Admission:  Basic Metabolic Panel:  Lab 07/04/12 8119  NA 134*  K 3.6  CL 98  CO2 26  GLUCOSE 110*  BUN 9  CREATININE 0.93  CALCIUM 9.2  MG --  PHOS --   CBC:  Lab 07/04/12 1200  WBC 8.7  NEUTROABS 5.8  HGB 16.6  HCT 47.8  MCV 99.4  PLT 257    Assessment/Plan 1-Cellulitis: most likely associated with foot open wound. Will start tx with vancomycin, leg elevation, IVF's, cold compresses and PRN analgesia. Will check x-ray of both foot.  2-NICOTINE ADDICTION: counseling provided. Started on nicoderm  3-HYPERTENSION, BENIGN: on and off according to patient. Currently has not been using any antihypertensive drug. BP stable. Will follow and determine if he needs any treatment.  4-Neuropathy: will check B12 and TSH. Could be associated with chronic smoking (especially with appearance of chronic bilat big toes wounds and decrease pulses). Will order ABI and arterial doppler.  5-Hyperglycemia: will check A1C.   Code  Status: Full Family Communication: No family at bedside Disposition Plan: home when medically ready.  Time spent: >30 minutes  Olivianna Higley [facility] Pager [phone]  If 7PM-7AM, please contact night-coverage [email or web address] Password [email or web address] 07/04/2012, 3:52 PM

## 2012-07-04 NOTE — Progress Notes (Signed)
[name], is a 56 y.o. male,   MRN: [id number]  -  DOB - 1956/02/14  Outpatient Primary MD for the patient is No primary provider on file.  in for    Chief Complaint  Patient presents with  . Cellulitis     Blood pressure 123/93, pulse 102, temperature 98.3 F (36.8 C), temperature source Oral, resp. rate 18, weight 127.007 kg (280 lb), SpO2 99.00%.  Principal Problem:  *Cellulitis Active Problems:  NICOTINE ADDICTION  HYPERTENSION, BENIGN  56 yo hx HTN, necrotizing pna 2012, anemia, LEE presents to ED with cc LLE redness/pain/swelling. States erythema started 3 days ago at left LE just above ankle and has worsened by spreading up leg to mid shin with some faint red streaks toward thigh. Pt reports some fever at home but not sure what temp exactly. Has denied chills/n/vomiting. One episode of loose stool. Pain with ambulation and palpation. Also with bilateral great toe wounds. Left open, without drainage/odor. Right with dark skin/growth. PPP bilaterally.   Work up in Ed VSS, WC 8.7, supported with IV fluids and pain medicine. Vancomycin started. Pt hemodynamically stable, non-toxic. Admit to med surg.

## 2012-07-04 NOTE — ED Notes (Signed)
Pt states "I've had this going on with my leg since 2004 or 2005, was seeing a doctor in [facility] but moved up here in 2011, not working and have no insurance"; pt presents with redness to LLE

## 2012-07-05 DIAGNOSIS — L97909 Non-pressure chronic ulcer of unspecified part of unspecified lower leg with unspecified severity: Secondary | ICD-10-CM

## 2012-07-05 LAB — BASIC METABOLIC PANEL
BUN: 9 mg/dL (ref 6–23)
CO2: 28 mEq/L (ref 19–32)
Calcium: 8.4 mg/dL (ref 8.4–10.5)
Chloride: 101 mEq/L (ref 96–112)
Creatinine, Ser: 0.96 mg/dL (ref 0.50–1.35)
GFR calc Af Amer: >90 mL/min (ref >90)
GFR calc non Af Amer: >90 mL/min (ref >90)
Glucose, Bld: 107 mg/dL — ABNORMAL HIGH (ref 70–99)
Potassium: 4.6 mEq/L (ref 3.5–5.1)
Sodium: 134 mEq/L — ABNORMAL LOW (ref 135–145)

## 2012-07-05 LAB — CBC
HCT: 43.3 % (ref 39.0–52.0)
Hemoglobin: 14.5 g/dL (ref 13.0–17.0)
MCH: 33.7 pg (ref 26.0–34.0)
MCHC: 33.5 g/dL (ref 30.0–36.0)
MCV: 100.7 fL — ABNORMAL HIGH (ref 78.0–100.0)
Platelets: 235 10*3/uL (ref 150–400)
RBC: 4.3 MIL/uL (ref 4.22–5.81)
RDW: 13.7 % (ref 11.5–15.5)
WBC: 7 10*3/uL (ref 4.0–10.5)

## 2012-07-05 LAB — VITAMIN B12: Vitamin B-12: 386 pg/mL (ref 211–911)

## 2012-07-05 LAB — HEMOGLOBIN A1C
Hgb A1c MFr Bld: 5.4 % (ref <5.7)
Mean Plasma Glucose: 108 mg/dL (ref <117)

## 2012-07-05 LAB — TSH: TSH: 1.318 u[IU]/mL (ref 0.350–4.500)

## 2012-07-05 NOTE — Progress Notes (Signed)
VASCULAR LAB PRELIMINARY  ARTERIAL  ABI completed:    RIGHT    LEFT    PRESSURE WAVEFORM  PRESSURE WAVEFORM  BRACHIAL 114 Triphasic BRACHIAL 122 Triphasic  DP 139 Triphasic DP 129 Dampened monophasic  AT   AT    PT 139 Biphasic PT 123 Monophasic  PER   PER    GREAT TOE  NA GREAT TOE  NA    RIGHT LEFT  ABI 1.14 1.06   The right ABI=1.14 and the left ABI=1.06, which is within normal limits bilaterally. The left dorsalis pedis and posterior tibial arteries demonstrate abnormal waveforms, however this may be due to decreased penetration secondary to swelling.   07/05/2012 1:40 PM Gertie Fey, RDMS, RDCS

## 2012-07-05 NOTE — Progress Notes (Signed)
[facility] PROGRESS NOTE  [name] HQI:[id number] DOB: [birth year] DOA: 07/04/2012 PCP: No primary provider on file.  Assessment/Plan: Principal Problem:  *Cellulitis Active Problems:  NICOTINE ADDICTION  HYPERTENSION, BENIGN  Neuropathy  Hyperglycemia  1-Cellulitis/great toe ulcers:  -foot x-rays neg for osteo, pt afebrile -continue vancomycin, leg elevation, IVF's, cold compresses and PRN analgesia.  -consult wound care 2-NICOTINE ADDICTION: counseling provided.  continue nicoderm  3-HYPERTENSION, BENIGN: on and off according to patient. Currently has not been using any antihypertensive drug. BP stable. Will follow and determine if he needs any treatment.  4-Neuropathy:  B12 and TSH wnl. Could be associated with chronic smoking (especially with appearance of chronic bilat big toes wounds and decrease pulses).  -await ABI and arterial doppler.  5-Hyperglycemia: possibly stress hyperglycemia, AIC wnl at 5.4 and BG this am 8/7 - 107  Code Status: full Disposition Plan: to home when medically stable    Brief narrative: Pt IS 56y/o male with PMH od tobacco abuse, neuropathy in both feet admitted with a 2-3 day h/o worsening pain, swelling and erythema on his LLE. Patient reports associated fever at home.  On exam in ED he was found to have bilateral chronic open wound on his big toes and onychomycosis- pt repots he has had these ulcers on nad off over a 75yr period.   Consultants:  Wound care - pending  Procedures:  none  Antibiotics:  vanc beginning 8/6  HPI/Subjective: States better today- erythema and swelling a little less. States pain mostly when he tries to bear weight  Objective: Filed Vitals:   07/04/12 1508 07/04/12 2258 07/05/12 0700 07/05/12 1407  BP: 120/90 95/63 108/72 107/74  Pulse: 98 86 76 71  Temp: 98.6 F (37 C) 98.8 F (37.1 C) 97.9 F (36.6 C) 97.4 F (36.3 C)  TempSrc: Oral Oral Oral Oral  Resp: 18 20 20 18   Height: 6\' 8"  (2.032 m)      Weight: 127.007 kg (280 lb)     SpO2: 99% 95% 94% 98%    Intake/Output Summary (Last 24 hours) at 07/05/12 1430 Last data filed at 07/04/12 2259  Gross per 24 hour  Intake    480 ml  Output      0 ml  Net    480 ml    Exam:   General: older WM, in NAD  Cardiovascular: RRR, nl S1, S2  Respiratory: clear, no wheezes  Abdomen: soft, +BS, NT/ND  EXT: LLE  From mid leg with +2edema erythema and tenderness. Great toes bil. With fungating appearing ulcers with overlying dry hyperkeratotic skin.                  Data Reviewed: Basic Metabolic Panel:  Lab 07/05/12 1324 07/04/12 1631 07/04/12 1200  NA 134* -- 134*  K 4.6 -- 3.6  CL 101 -- 98  CO2 28 -- 26  GLUCOSE 107* -- 110*  BUN 9 -- 9  CREATININE 0.96 -- 0.93  CALCIUM 8.4 -- 9.2  MG -- 2.4 --  PHOS -- 3.6 --   Liver Function Tests:  Lab 07/04/12 1631  AST 28  ALT 24  ALKPHOS 122*  BILITOT 0.3  PROT 7.6  ALBUMIN 3.0*   No results found for this basename: LIPASE:5,AMYLASE:5 in the last 168 hours No results found for this basename: AMMONIA:5 in the last 168 hours CBC:  Lab 07/05/12 0400 07/04/12 1200  WBC 7.0 8.7  NEUTROABS -- 5.8  HGB 14.5 16.6  HCT 43.3 47.8  MCV  100.7* 99.4  PLT 235 257   Cardiac Enzymes: No results found for this basename: CKTOTAL:5,CKMB:5,CKMBINDEX:5,TROPONINI:5 in the last 168 hours BNP (last 3 results) No results found for this basename: PROBNP:3 in the last 8760 hours CBG: No results found for this basename: GLUCAP:5 in the last 168 hours  No results found for this or any previous visit (from the past 240 hour(s)).   Studies: Dg Foot 2 Views Left  07/04/2012  *RADIOLOGY REPORT*  Clinical Data: Nonhealing wound of the left great toe.  LEFT FOOT - 2 VIEW  Comparison: None.  Findings: There is no visible osteomyelitis of the left great toe. Mild degenerative changes of the first metatarsal phalangeal joint. Old [place]erg infraction of the head of the second metatarsal with  secondary degenerative changes.  Minimal spurring at the dorsal aspect of the intertarsal and tarsometatarsal joints.  IMPRESSION: No evidence of osteomyelitis of the left great toe.  Original Report Authenticated By: Gwynn Burly, M.D.   Dg Foot 2 Views Right  07/04/2012  *RADIOLOGY REPORT*  Clinical Data: Nonhealing wound on the soft tissues of the right great toe.  RIGHT FOOT - 2 VIEW  Comparison: None.  Findings: There is erosion of the dorsal aspect of the tuft of the distal phalangeal bone of the great toe seen only on the lateral view.  The bones of the great toe are otherwise normal.  Minimal degenerative changes of the head of the first metatarsal.  Slight degenerative changes are present in the tarsal joints. Small plantar calcaneal spur.  IMPRESSION: Findings consistent with focal osteomyelitis of the dorsal aspect of the tuft of the distal phalangeal bone of the great toe.  Original Report Authenticated By: Gwynn Burly, M.D.    Scheduled Meds:   . enoxaparin (LOVENOX) injection  40 mg Subcutaneous Q24H  . nicotine  14 mg Transdermal Daily  . pneumococcal 23 valent vaccine  0.5 mL Intramuscular Tomorrow-1000  . vancomycin  1,250 mg Intravenous Q12H  . vancomycin  1,500 mg Intravenous Once  . DISCONTD: sodium chloride   Intravenous STAT  . DISCONTD: vancomycin  2,500 mg Intravenous Once   Continuous Infusions:   . sodium chloride 75 mL/hr at 07/04/12 1730  . DISCONTD: sodium chloride 75 mL/hr at 07/04/12 1356    Principal Problem:  *Cellulitis Active Problems:  NICOTINE ADDICTION  HYPERTENSION, BENIGN  Neuropathy  Hyperglycemia    Time spent:    [facility] C  [facility] Pager [phone]. If 8PM-8AM, please contact night-coverage at [email or web address], password [facility] 07/05/2012, 2:30 PM  LOS: 1 day

## 2012-07-05 NOTE — Progress Notes (Signed)
Nutrition Brief Note  Patient identified on the Nutrition Risk Report for problems chewing/swallowing foods and/or liquids.   Body mass index is 30.76 kg/(m^2). Pt meets criteria for class I obesity based on current BMI.   Met with pt who reports his teeth have never fit well but this does not affect his intake and that he was eating well PTA, at least 2 meals/day, without any changes in weight. Current diet order is regular, patient is consuming approximately 100% of meals at this time. Labs and medications reviewed.   Encouraged adequate protein intake to help with wound healing. No nutrition interventions warranted at this time. If nutrition issues arise, please re-consult RD.  Dietitian# [phone]

## 2012-07-05 NOTE — Consult Note (Signed)
WOC consult Note Reason for Consult:Chronic, non-healing ulcerations with callus overgrowth at bilateral medial great toes Wound type: neuropathis Pressure Ulcer POA: No Measurement:right great toe:  3.5cm x 2cm - unable to determine depth due to the presence of callus.                        Left great toe: 2cm x 1.5cm x .5cm Wound ZOX:WRUE visible is pink, but majority of area is obscured by white/grey callus Drainage (amount, consistency, odor) None. Patient states that areas bled this am. Periwound: intact Dressing procedure/placement/frequency:I will initiate saline dressings, twice daily.  MD may wish to consider referral to Podiatry or Ortho (inpatient or outpatient) for debriding of callused areas and further recommendations for topical care. I will not follow.  Please re-consult if needed. Thanks, Ladona Mow, MSN, RN, [facility], CWOCN [phone])

## 2012-07-05 NOTE — Progress Notes (Signed)
Referral rec'd for CSW to assist with disability application- I spoke with Financial Counselor who states they are following to see if he may be eligible for disability and/or Medicaid.  Provided patient with # for follow up as well as with address for local Social Security office if he wishes to f/u there. No CSW needs identified- will sign off- will ask RNCM to f/u for RX assistance as he states he will have no means to get filled.   Reece Levy, MSW, [facility] [phone]

## 2012-07-06 MED ORDER — SENNA 8.6 MG PO TABS
1.0000 | ORAL_TABLET | Freq: Every day | ORAL | Status: DC
  Administered 2012-07-06 – 2012-07-09 (×4): 8.6 mg via ORAL
  Filled 2012-07-06 (×4): qty 1

## 2012-07-06 MED ORDER — ZOLPIDEM TARTRATE 5 MG PO TABS
5.0000 mg | ORAL_TABLET | Freq: Once | ORAL | Status: AC
  Administered 2012-07-06: 5 mg via ORAL

## 2012-07-06 MED ORDER — BISACODYL 5 MG PO TBEC: 10.0000 mg | DELAYED_RELEASE_TABLET | Freq: Every day | ORAL | Status: DC | PRN

## 2012-07-06 NOTE — Consult Note (Signed)
Reason for Consult:Bilateral great toe ulcerations Referring Physician:Dr. Darrious Youman [name] an 56 y.o. male.  HPI: [name] [name] admitted for left leg cellulitis a few days ago.  [name] has a 10 year history of bilateral great toe medial verrucas growths that are about 1.5 x 3 cm in diameter.  They have periodically been shaved down by local podiatrist and podiatrist in [place], they've never had any.  All and drainage although occasionally they will bleed.  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  On the left side and the mass never had any swelling or erythema nor did the left foot and the cellulitis of the left leg [name] probably not related.  Plain x-rays To the left toe shows no evidence of osteomyelitis, to the right great toe in the tuft.  There [name] a small cystic area that could represent osteomyelitis versus a degenerative cyst.  Past Medical History  Diagnosis Date  . Hypertension   . Pneumonia 2011  . Neuropathy     in both feet    Past Surgical History  Procedure Date  . Tooth extraction 2011    multiple teeth extracted    History reviewed. No pertinent family history.  Social History:  reports that [name] has been smoking.  [name] has never used smokeless tobacco. [name] reports that [name] drinks alcohol. [name] reports that [name] does not use illicit drugs.  Allergies: No Known Allergies  Medications: I have reviewed the [name]'s current medications.  Results for orders placed during the hospital encounter of 07/04/12 (from the past 48 hour(s))  HEPATIC FUNCTION PANEL     Status: Abnormal   Collection Time   07/04/12  4:31 PM      Component Value Range Comment   Total Protein 7.6  6.0 - 8.3 g/dL    Albumin 3.0 (*) 3.5 - 5.2 g/dL    AST 28  0 - 37 U/L    ALT 24  0 - 53 U/L    Alkaline Phosphatase 122 (*) 39 - 117 U/L    Total Bilirubin 0.3  0.3 - 1.2 mg/dL    Bilirubin, Direct 0.1  0.0 - 0.3 mg/dL    Indirect Bilirubin 0.2 (*) 0.3 -  0.9 mg/dL   MAGNESIUM     Status: Normal   Collection Time   07/04/12  4:31 PM      Component Value Range Comment   Magnesium 2.4  1.5 - 2.5 mg/dL   PHOSPHORUS     Status: Normal   Collection Time   07/04/12  4:31 PM      Component Value Range Comment   Phosphorus 3.6  2.3 - 4.6 mg/dL   TSH     Status: Normal   Collection Time   07/04/12  4:31 PM      Component Value Range Comment   TSH 1.318  0.350 - 4.500 uIU/mL   HEMOGLOBIN A1C     Status: Normal   Collection Time   07/04/12  4:31 PM      Component Value Range Comment   Hemoglobin A1C 5.4  <5.7 %    Mean Plasma Glucose 108  <117 mg/dL   VITAMIN O53     Status: Normal   Collection Time   07/04/12  4:33 PM      Component Value Range Comment   Vitamin B-12 386  211 - 911 pg/mL   BASIC METABOLIC PANEL     Status: Abnormal  Collection Time   07/05/12  4:00 AM      Component Value Range Comment   Sodium 134 (*) 135 - 145 mEq/L    Potassium 4.6  3.5 - 5.1 mEq/L    Chloride 101  96 - 112 mEq/L    CO2 28  19 - 32 mEq/L    Glucose, Bld 107 (*) 70 - 99 mg/dL    BUN 9  6 - 23 mg/dL    Creatinine, Ser 1.19  0.50 - 1.35 mg/dL    Calcium 8.4  8.4 - 14.7 mg/dL    GFR calc non Af Amer >90  >90 mL/min    GFR calc Af Amer >90  >90 mL/min   CBC     Status: Abnormal   Collection Time   07/05/12  4:00 AM      Component Value Range Comment   WBC 7.0  4.0 - 10.5 K/uL    RBC 4.30  4.22 - 5.81 MIL/uL    Hemoglobin 14.5  13.0 - 17.0 g/dL    HCT 82.9  56.2 - 13.0 %    MCV 100.7 (*) 78.0 - 100.0 fL    MCH 33.7  26.0 - 34.0 pg    MCHC 33.5  30.0 - 36.0 g/dL    RDW 86.5  78.4 - 69.6 %    Platelets 235  150 - 400 K/uL     Dg Foot 2 Views Left  07/04/2012  *RADIOLOGY REPORT*  Clinical Data: Nonhealing wound of the left great toe.  LEFT FOOT - 2 VIEW  Comparison: None.  Findings: There [name] no visible osteomyelitis of the left great toe. Mild degenerative changes of the first metatarsal phalangeal joint. Old Freiberg infraction of the head of the second  metatarsal with secondary degenerative changes.  Minimal spurring at the dorsal aspect of the intertarsal and tarsometatarsal joints.  IMPRESSION: No evidence of osteomyelitis of the left great toe.  Original Report Authenticated By: Gwynn Burly, M.D.   Dg Foot 2 Views Right  07/04/2012  *RADIOLOGY REPORT*  Clinical Data: Nonhealing wound on the soft tissues of the right great toe.  RIGHT FOOT - 2 VIEW  Comparison: None.  Findings: There [name] erosion of the dorsal aspect of the tuft of the distal phalangeal bone of the great toe seen only on the lateral view.  The bones of the great toe are otherwise normal.  Minimal degenerative changes of the head of the first metatarsal.  Slight degenerative changes are present in the tarsal joints. Small plantar calcaneal spur.  IMPRESSION: Findings consistent with focal osteomyelitis of the dorsal aspect of the tuft of the distal phalangeal bone of the great toe.  Original Report Authenticated By: Gwynn Burly, M.D.    ROS: Except for outlined in the history of present illness, noncontributory Blood pressure 115/75, pulse 74, temperature 97.5 F (36.4 C), temperature source Oral, resp. rate 18, height 6\' 8"  (2.032 m), weight 127.007 kg (280 lb), SpO2 97.00%. Physical Exam:Both great toes have medial verruca [name] masses, 2 x 3 cm along the distal phalanx.  These are most consistent with plantar warts, there [name] no drainage, and except for the masses themselves no erythema, no increased warmth, no ascending cellulitis from the toes.Both great toenails have impressive fungal infections as well.  Assessment/Plan:Bilateral large plantar warts to the medial aspect of the distal phalanx of the great toes for the last 8-10 years.  I do not believe there [name] any active infection or osteomyelitis in  either great toe at this time.  Plan: The [name] should continue to receive IV antibiotics to clear up the cellulitis in his left leg, I assume [name]'ll be on by mouth  antibiotics for a while after that.  After the cellulitis has cleared up [name] should come see me in my office and we will discuss surgical treatment of the warts that involved 30 much the whole medial aspect of the distal phalanx of both toes.  I suspect the most reliable and straightforward way of treating this would be a DIP joint amputations to both toes, which also haven't compressive fungal infections to the nailbeds.  This should provide definitive treatment, and get him back to work as soon as possible.  Gustave Lindeman J 07/06/2012, 2:59 PM

## 2012-07-06 NOTE — Progress Notes (Signed)
[facility] PROGRESS NOTE  [name] ZOX:[id number] DOB: 1956/01/03 DOA: 07/04/2012 PCP: No primary provider on file.  Assessment/Plan: Principal Problem:  *Cellulitis Active Problems:  NICOTINE ADDICTION  HYPERTENSION, BENIGN  Neuropathy  Hyperglycemia  1-Cellulitis/great toe ulcers:  -foot x-rays neg for osteo, pt afebrile and remains hemodynamically stable -continue vancomycin, leg elevation, IVF's, cold compresses and PRN analgesia.  -will consult ortho as recommended per wound care -continue current abx 2-NICOTINE ADDICTION: counseling provided on admission.  continue nicoderm  3-HYPERTENSION, BENIGN: on and off according to patient. Currently has not been using any antihypertensive drug. BP remaining controlled so far on meds 4-Neuropathy:  B12 and TSH wnl. Could be associated with chronic smoking (especially with appearance of chronic bilat big toes wounds and decrease pulses).  -await ABI and arterial doppler.  5-Hyperglycemia: possibly stress hyperglycemia, AIC wnl at 5.4 and no further hyperglycemia  Code Status: full Disposition Plan: to home when medically stable    Brief narrative: Pt IS 56y/o male with PMH od tobacco abuse, neuropathy in both feet admitted with a 2-3 day h/o worsening pain, swelling and erythema on his LLE. Patient reports associated fever at home.  On exam in ED he was found to have bilateral chronic open wound on his big toes and onychomycosis- pt repots he has had these ulcers on nad off over a 27yr period.   Consultants:  Wound care - pending  Procedures:  none  Antibiotics:  vanc beginning 8/6  HPI/Subjective: denies any new c/o Objective: Filed Vitals:   07/05/12 1407 07/05/12 2135 07/05/12 2200 07/06/12 0505  BP: 107/74  127/90 116/72  Pulse: 71 72 81 84  Temp: 97.4 F (36.3 C)  98.1 F (36.7 C) 97.8 F (36.6 C)  TempSrc: Oral  Oral Oral  Resp: 18  20 20   Height:      Weight:      SpO2: 98%  98% 95%     Intake/Output Summary (Last 24 hours) at 07/06/12 8119 Last data filed at 07/06/12 1478  Gross per 24 hour  Intake   1133 ml  Output      0 ml  Net   1133 ml    Exam:   General: older WM, in NAD  Cardiovascular: RRR, nl S1, S2  Respiratory: clear, no wheezes  Abdomen: soft, +BS, NT/ND  EXT: LLE  From mid leg with +2edema erythema and tenderness. Great toes bil. With fungating appearing ulcers with overlying dry hyperkeratotic skin.                  Data Reviewed: Basic Metabolic Panel:  Lab 07/05/12 2956 07/04/12 1631 07/04/12 1200  NA 134* -- 134*  K 4.6 -- 3.6  CL 101 -- 98  CO2 28 -- 26  GLUCOSE 107* -- 110*  BUN 9 -- 9  CREATININE 0.96 -- 0.93  CALCIUM 8.4 -- 9.2  MG -- 2.4 --  PHOS -- 3.6 --   Liver Function Tests:  Lab 07/04/12 1631  AST 28  ALT 24  ALKPHOS 122*  BILITOT 0.3  PROT 7.6  ALBUMIN 3.0*   No results found for this basename: LIPASE:5,AMYLASE:5 in the last 168 hours No results found for this basename: AMMONIA:5 in the last 168 hours CBC:  Lab 07/05/12 0400 07/04/12 1200  WBC 7.0 8.7  NEUTROABS -- 5.8  HGB 14.5 16.6  HCT 43.3 47.8  MCV 100.7* 99.4  PLT 235 257   Cardiac Enzymes: No results found for this basename: CKTOTAL:5,CKMB:5,CKMBINDEX:5,TROPONINI:5 in the last  168 hours BNP (last 3 results) No results found for this basename: PROBNP:3 in the last 8760 hours CBG: No results found for this basename: GLUCAP:5 in the last 168 hours  No results found for this or any previous visit (from the past 240 hour(s)).   Studies: Dg Foot 2 Views Left  07/04/2012  *RADIOLOGY REPORT*  Clinical Data: Nonhealing wound of the left great toe.  LEFT FOOT - 2 VIEW  Comparison: None.  Findings: There is no visible osteomyelitis of the left great toe. Mild degenerative changes of the first metatarsal phalangeal joint. 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  Minimal spurring at the  dorsal aspect of the intertarsal and tarsometatarsal joints.  IMPRESSION: No evidence of osteomyelitis of the left great toe.  Original Report Authenticated By: Gwynn Burly, M.D.   Dg Foot 2 Views Right  07/04/2012  *RADIOLOGY REPORT*  Clinical Data: Nonhealing wound on the soft tissues of the right great toe.  RIGHT FOOT - 2 VIEW  Comparison: None.  Findings: There is erosion of the dorsal aspect of the tuft of the distal phalangeal bone of the great toe seen only on the lateral view.  The bones of the great toe are otherwise normal.  Minimal degenerative changes of the head of the first metatarsal.  Slight degenerative changes are present in the tarsal joints. Small plantar calcaneal spur.  IMPRESSION: Findings consistent with focal osteomyelitis of the dorsal aspect of the tuft of the distal phalangeal bone of the great toe.  Original Report Authenticated By: Gwynn Burly, M.D.    Scheduled Meds:    . enoxaparin (LOVENOX) injection  40 mg Subcutaneous Q24H  . nicotine  14 mg Transdermal Daily  . pneumococcal 23 valent vaccine  0.5 mL Intramuscular Tomorrow-1000  . vancomycin  1,250 mg Intravenous Q12H   Continuous Infusions:   Principal Problem:  *Cellulitis Active Problems:  NICOTINE ADDICTION  HYPERTENSION, BENIGN  Neuropathy  Hyperglycemia    Time spent:    Kela Millin  [facility] Pager [phone]. If 8PM-8AM, please contact night-coverage at [email or web address], password [facility] 07/06/2012, 9:52 AM  LOS: 2 days

## 2012-07-07 LAB — CBC
HCT: 42.7 % (ref 39.0–52.0)
Hemoglobin: 14.5 g/dL (ref 13.0–17.0)
MCH: 34.1 pg — ABNORMAL HIGH (ref 26.0–34.0)
MCHC: 34 g/dL (ref 30.0–36.0)
MCV: 100.5 fL — ABNORMAL HIGH (ref 78.0–100.0)
Platelets: 283 10*3/uL (ref 150–400)
RBC: 4.25 MIL/uL (ref 4.22–5.81)
RDW: 13.6 % (ref 11.5–15.5)
WBC: 7 10*3/uL (ref 4.0–10.5)

## 2012-07-07 LAB — BASIC METABOLIC PANEL
BUN: 6 mg/dL (ref 6–23)
CO2: 27 mEq/L (ref 19–32)
Calcium: 8.5 mg/dL (ref 8.4–10.5)
Chloride: 102 mEq/L (ref 96–112)
Creatinine, Ser: 0.78 mg/dL (ref 0.50–1.35)
GFR calc Af Amer: >90 mL/min (ref >90)
GFR calc non Af Amer: >90 mL/min (ref >90)
Glucose, Bld: 96 mg/dL (ref 70–99)
Potassium: 4.1 mEq/L (ref 3.5–5.1)
Sodium: 138 mEq/L (ref 135–145)

## 2012-07-07 LAB — VANCOMYCIN, TROUGH: Vancomycin Tr: 11 ug/mL (ref 10.0–20.0)

## 2012-07-07 MED ORDER — ZOLPIDEM TARTRATE 5 MG PO TABS
5.0000 mg | ORAL_TABLET | Freq: Every evening | ORAL | Status: DC | PRN
  Administered 2012-07-07 – 2012-07-08 (×2): 5 mg via ORAL
  Filled 2012-07-07 (×2): qty 1

## 2012-07-07 MED ORDER — PIPERACILLIN-TAZOBACTAM 3.375 G IVPB
3.3750 g | Freq: Three times a day (TID) | INTRAVENOUS | Status: DC
  Administered 2012-07-07 – 2012-07-09 (×7): 3.375 g via INTRAVENOUS
  Filled 2012-07-07 (×8): qty 50

## 2012-07-07 MED ORDER — HYDROCODONE-ACETAMINOPHEN 5-325 MG PO TABS
1.0000 | ORAL_TABLET | ORAL | Status: DC | PRN
  Administered 2012-07-07 – 2012-07-08 (×5): 2 via ORAL
  Filled 2012-07-07 (×4): qty 2
  Filled 2012-07-07: qty 1
  Filled 2012-07-07: qty 2
  Filled 2012-07-07: qty 1

## 2012-07-07 NOTE — Care Management Note (Signed)
    Page 1 of 1   07/07/2012     1:00:07 PM   CARE MANAGEMENT NOTE 07/07/2012  Patient:  Korb,Eh   Account Number:  [id number]  Date Initiated:  07/07/2012  Documentation initiated by:  Lonzo Saulter  Subjective/Objective Assessment:   56 yo male admitted 07/04/12 with cellulitis     Action/Plan:   D/C when medically stable.   Anticipated DC Date:  07/10/2012   Anticipated DC Plan:    In-house referral  Clinical Social Worker  Artist      DC Planning Services  CM consult  Medication Assistance              Status of service:  In process, will continue to follow    Per UR Regulation:  Reviewed for med. necessity/level of care/duration of stay  Comments:  07/07/12, Kathi Der RNC-MNN, BSN, [phone],   CM met with pt after receiving referral from CSW.  Pt states he has not spoken with anyone yet concerning financial assistance/applying for Medicaid.  Discussed medication assistance with pt.  Pt is eligible for medication assistance per pharmacy-last used in 2011.  Pt aware we cannot assist with pain medeication.  Pt unsure nwhen he will be discharged and what discharge plan will be. Discussed $4 medications from [facility] and other local drug stores. Pt states [facility] uses [facility] on [address] because this is the closest to him.  CM called Melissa Montane and Morrie Sheldon at [facility],  both financial counselors, about following up with pt concerning financial assistance.  CSW will continue to follow for discharge needs and plans.

## 2012-07-07 NOTE — Progress Notes (Addendum)
ANTIBIOTIC CONSULT NOTE - FOLLOW UP  Pharmacy Consult for Vancomycin, Zosyn (added 8/9) Indication: Cellulitis  No Known Allergies  Patient Measurements: Height: 6\' 8"  (203.2 cm) Weight: 280 lb (127.007 kg) IBW/kg (Calculated) : 96   Vital Signs: Temp: 97.8 F (36.6 C) (08/09 0606) Temp src: Oral (08/09 0606) BP: 120/78 mmHg (08/09 0606) Pulse Rate: 70  (08/09 0606) Intake/Output from previous day: 08/08 0701 - 08/09 0700 In: 360 [P.O.:360] Out: -  Intake/Output from this shift: Total I/O In: 360 [P.O.:360] Out: -   Labs:  Basename 07/07/12 0350 07/05/12 0400 07/04/12 1200  WBC 7.0 7.0 8.7  HGB 14.5 14.5 16.6  PLT 283 235 257  LABCREA -- -- --  CREATININE 0.78 0.96 0.93   Estimated Creatinine Clearance: 160 ml/min (by C-G formula based on Cr of 0.78).  Basename 07/07/12 0350  VANCOTROUGH 11.0  VANCOPEAK --  VANCORANDOM --  GENTTROUGH --  GENTPEAK --  GENTRANDOM --  TOBRATROUGH --  TOBRAPEAK --  TOBRARND --  AMIKACINPEAK --  AMIKACINTROU --  AMIKACIN --     Microbiology: No results found for this or any previous visit (from the past 720 hour(s)).  Anti-infectives     Start     Dose/Rate Route Frequency Ordered Stop   07/05/12 0500   vancomycin (VANCOCIN) 1,250 mg in sodium chloride 0.9 % 250 mL IVPB        1,250 mg 166.7 mL/hr over 90 Minutes Intravenous Every 12 hours 07/04/12 1604     07/04/12 1700   vancomycin (VANCOCIN) 2,500 mg in sodium chloride 0.9 % 500 mL IVPB  Status:  Discontinued        2,500 mg 250 mL/hr over 120 Minutes Intravenous  Once 07/04/12 1604 07/04/12 1606   07/04/12 1630   vancomycin (VANCOCIN) 1,500 mg in sodium chloride 0.9 % 500 mL IVPB        1,500 mg 250 mL/hr over 120 Minutes Intravenous  Once 07/04/12 1611 07/04/12 1930   07/04/12 1145   vancomycin (VANCOCIN) IVPB 1000 mg/200 mL premix        1,000 mg 200 mL/hr over 60 Minutes Intravenous  Once 07/04/12 1128 07/04/12 1344          Assessment:  56 yo male  with cellulitis of LLE, chronic bilateral big toe wounds.  L great toe negative for osteomyelitis, R great toe with possible osteo, ortho note; MD doubts osteo and recommends amputation at a later date.  No diagnosis of DM, HgbA1C is 5.4 %-not diagnostic of DM, but history of neuropathy in lower extremities.  Vanc trough 11.0 on Vancomycin 1250mg  IV q12, anticipate some accumulation if continued for lengthy therapy.  Goal of Therapy:  Vancomycin trough level 10-15 mcg/ml  Plan:   Continue Vanc 1250mg  q12  Could increase dose if higher trough desired.  Otho Bellows PharmD Pager [phone] 07/07/2012,6:28 AM   --------------------------------------------------------------------------------------------- A/P: to add Zosyn per pharmacy dosing per Md orders. Per stable current renal function, will start Zosyn 3.375g IV q8 (extended interval infusion)   Hessie Knows, PharmD, BCPS Pager [phone] 07/07/2012 11:07 AM

## 2012-07-07 NOTE — Progress Notes (Signed)
[facility] PROGRESS NOTE  [name] AOZ:[id number] DOB: 08-01-1956 DOA: 07/04/2012 PCP: No primary provider on file.  Assessment/Plan: Principal Problem:  *Cellulitis Active Problems:  NICOTINE ADDICTION  HYPERTENSION, BENIGN  Neuropathy  Hyperglycemia  1-Cellulitis/great toe ulcers:  -foot x-rays neg for osteo, pt afebrile and remains hemodynamically stable -continue vancomycin, leg elevation, IVF's, cold compresses and PRN analgesia.  -Orthopedics consulted on 8/8, patient seen by Dr. Turner Daniels and per his evaluation-findings consistent with warts recommending outpatient followup at his office after some cellulitis clears up for definitive surgical management. -Patient still with increased pain and no significant improvement in cellulitis today-will add Zosyn, change to Vicodin to improve pain control and follow. -Continue local wound care 2-NICOTINE ADDICTION: counseling provided on admission.  continue nicoderm  3-HYPERTENSION, BENIGN: on and off according to patient. Currently has not been using any antihypertensive drug. BP remaining controlled so far on meds 4-Neuropathy:  B12 and TSH wnl. Could be associated with chronic smoking (especially with appearance of chronic bilat big toes wounds and decrease pulses).  -await ABI and arterial doppler.  5-Hyperglycemia: possibly stress hyperglycemia, AIC wnl at 5.4 and no further hyperglycemia  Code Status: full Disposition Plan: to home when medically stable    Brief narrative: Pt IS 56y/o male with PMH od tobacco abuse, neuropathy in both feet admitted with a 2-3 day h/o worsening pain, swelling and erythema on his LLE. Patient reports associated fever at home.  On exam in ED he was found to have bilateral chronic open wound on his big toes and onychomycosis- pt repots he has had these ulcers on nad off over a 85yr period.   Consultants:  Wound care - pending  Procedures:  none  Antibiotics:  vanc beginning  8/6  HPI/Subjective: Still with complaints of increased pain in left leg, states swelling and redness about the same. Objective: Filed Vitals:   07/06/12 0505 07/06/12 1458 07/06/12 2042 07/07/12 0606  BP: 116/72 115/75 123/83 120/78  Pulse: 84 74 71 70  Temp: 97.8 F (36.6 C) 97.5 F (36.4 C) 97.7 F (36.5 C) 97.8 F (36.6 C)  TempSrc: Oral Oral Oral Oral  Resp: 20 18 18 17   Height:      Weight:      SpO2: 95% 97% 94% 96%    Intake/Output Summary (Last 24 hours) at 07/07/12 9629 Last data filed at 07/07/12 0606  Gross per 24 hour  Intake    360 ml  Output      0 ml  Net    360 ml    Exam:   General: older WM, in NAD  Cardiovascular: RRR, nl S1, S2  Respiratory: clear, no wheezes  Abdomen: soft, +BS, NT/ND  EXT: LLE  From mid leg with +2edema erythema and tenderness. Great toes bil. With fungating appearing ulcers with overlying dry hyperkeratotic skin.                  Data Reviewed: Basic Metabolic Panel:  Lab 07/07/12 5284 07/05/12 0400 07/04/12 1631 07/04/12 1200  NA 138 134* -- 134*  K 4.1 4.6 -- 3.6  CL 102 101 -- 98  CO2 27 28 -- 26  GLUCOSE 96 107* -- 110*  BUN 6 9 -- 9  CREATININE 0.78 0.96 -- 0.93  CALCIUM 8.5 8.4 -- 9.2  MG -- -- 2.4 --  PHOS -- -- 3.6 --   Liver Function Tests:  Lab 07/04/12 1631  AST 28  ALT 24  ALKPHOS 122*  BILITOT 0.3  PROT 7.6  ALBUMIN 3.0*   No results found for this basename: LIPASE:5,AMYLASE:5 in the last 168 hours No results found for this basename: AMMONIA:5 in the last 168 hours CBC:  Lab 07/07/12 0350 07/05/12 0400 07/04/12 1200  WBC 7.0 7.0 8.7  NEUTROABS -- -- 5.8  HGB 14.5 14.5 16.6  HCT 42.7 43.3 47.8  MCV 100.5* 100.7* 99.4  PLT 283 235 257   Cardiac Enzymes: No results found for this basename: CKTOTAL:5,CKMB:5,CKMBINDEX:5,TROPONINI:5 in the last 168 hours BNP (last 3 results) No results found for this basename: PROBNP:3 in the last 8760 hours CBG: No results found for this basename:  GLUCAP:5 in the last 168 hours  No results found for this or any previous visit (from the past 240 hour(s)).   Studies: Dg Foot 2 Views Left  07/04/2012  *RADIOLOGY REPORT*  Clinical Data: Nonhealing wound of the left great toe.  LEFT FOOT - 2 VIEW  Comparison: None.  Findings: There is no visible osteomyelitis of the left great toe. Mild degenerative changes of the first metatarsal phalangeal joint. 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  Minimal spurring at the dorsal aspect of the intertarsal and tarsometatarsal joints.  IMPRESSION: No evidence of osteomyelitis of the left great toe.  Original Report Authenticated By: Gwynn Burly, M.D.   Dg Foot 2 Views Right  07/04/2012  *RADIOLOGY REPORT*  Clinical Data: Nonhealing wound on the soft tissues of the right great toe.  RIGHT FOOT - 2 VIEW  Comparison: None.  Findings: There is erosion of the dorsal aspect of the tuft of the distal phalangeal bone of the great toe seen only on the lateral view.  The bones of the great toe are otherwise normal.  Minimal degenerative changes of the head of the first metatarsal.  Slight degenerative changes are present in the tarsal joints. Small plantar calcaneal spur.  IMPRESSION: Findings consistent with focal osteomyelitis of the dorsal aspect of the tuft of the distal phalangeal bone of the great toe.  Original Report Authenticated By: Gwynn Burly, M.D.    Scheduled Meds:    . enoxaparin (LOVENOX) injection  40 mg Subcutaneous Q24H  . nicotine  14 mg Transdermal Daily  . senna  1 tablet Oral Daily  . vancomycin  1,250 mg Intravenous Q12H  . zolpidem  5 mg Oral Once   Continuous Infusions:   Principal Problem:  *Cellulitis Active Problems:  NICOTINE ADDICTION  HYPERTENSION, BENIGN  Neuropathy  Hyperglycemia    Time spent:    Kela Millin  [facility] Pager [phone]. If 8PM-8AM, please contact night-coverage  at [email or web address], password [facility] 07/07/2012, 9:17 AM  LOS: 3 days

## 2012-07-08 DIAGNOSIS — B070 Plantar wart: Secondary | ICD-10-CM

## 2012-07-08 MED ORDER — ENOXAPARIN SODIUM 60 MG/0.6ML ~~LOC~~ SOLN
60.0000 mg | SUBCUTANEOUS | Status: DC
  Administered 2012-07-08: 60 mg via SUBCUTANEOUS
  Filled 2012-07-08 (×2): qty 0.6

## 2012-07-08 MED ORDER — SODIUM CHLORIDE 0.9 % IV SOLN
INTRAVENOUS | Status: DC | PRN
  Administered 2012-07-08 (×2): 250 mL via INTRAVENOUS

## 2012-07-08 NOTE — Progress Notes (Signed)
[facility] PROGRESS NOTE  Griffon Herberg ZOX:[id number] DOB: 04-Jan-1956 DOA: 07/04/2012 PCP: No primary provider on file.  Assessment/Plan: Principal Problem:  *Cellulitis Active Problems:  NICOTINE ADDICTION  HYPERTENSION, BENIGN  Neuropathy  Hyperglycemia  1-Cellulitis/great toe ulcers:  -foot x-rays neg for osteo, pt afebrile and remains hemodynamically stable -continue vancomycin, leg elevation, IVF's, cold compresses and PRN analgesia.  -Orthopedics consulted on 8/8, patient seen by Dr. Turner Daniels and per his evaluation-findings consistent with warts recommending outpatient followup at his office after some cellulitis clears up for definitive surgical management. -Better pain control, continue pain management, continue vancomycin and Zosyn-improving clinically. -if Continues to improve plan discharge in a.m. on doxycycline. -Continue local wound care 2-NICOTINE ADDICTION: counseling provided on admission.  continue nicoderm  3-HYPERTENSION, BENIGN: on and off according to patient. Currently has not been using any antihypertensive drug. BP remaining controlled so far on meds 4-Neuropathy:  B12 and TSH wnl. Could be associated with chronic smoking (especially with appearance of chronic bilat big toes wounds and decrease pulses).  -await ABI and arterial doppler.  5-Hyperglycemia: possibly stress hyperglycemia, AIC wnl at 5.4 and no further hyperglycemia  Code Status: full Disposition Plan: to home when medically stable    Brief narrative: Pt IS 56y/o male with PMH od tobacco abuse, neuropathy in both feet admitted with a 2-3 day h/o worsening pain, swelling and erythema on his LLE. Patient reports associated fever at home.  On exam in ED he was found to have bilateral chronic open wound on his big toes and onychomycosis- pt repots he has had these ulcers on nad off over a 97yr period.   Consultants:  Wound care - pending  Procedures:  none  Antibiotics:  vanc beginning  8/6, zosyn beginning 8/9  HPI/Subjective: States left leg pain better controlled also decreased swelling. Objective:   Filed Vitals:   07/07/12 2020 07/07/12 2146 07/08/12 0534 07/08/12 1446  BP:  146/93 125/80 125/83  Pulse: 68 70 68 64  Temp:  97.6 F (36.4 C) 97.2 F (36.2 C) 98 F (36.7 C)  TempSrc:  Oral Oral Oral  Resp:  16 16 18   Height:      Weight:      SpO2:  97% 94% 99%    Intake/Output Summary (Last 24 hours) at 07/08/12 1744 Last data filed at 07/08/12 1328  Gross per 24 hour  Intake 2452.68 ml  Output      0 ml  Net 2452.68 ml    Exam:   General: older WM, in NAD  Cardiovascular: RRR, nl S1, S2  Respiratory: clear, no wheezes  Abdomen: soft, +BS, NT/ND  EXT: LLE  From mid leg with +2edema erythema and tenderness. Great toes bil. With fungating appearing ulcers with overlying dry hyperkeratotic skin.                  Data Reviewed: Basic Metabolic Panel:  Lab 07/07/12 8119 07/05/12 0400 07/04/12 1631 07/04/12 1200  NA 138 134* -- 134*  K 4.1 4.6 -- 3.6  CL 102 101 -- 98  CO2 27 28 -- 26  GLUCOSE 96 107* -- 110*  BUN 6 9 -- 9  CREATININE 0.78 0.96 -- 0.93  CALCIUM 8.5 8.4 -- 9.2  MG -- -- 2.4 --  PHOS -- -- 3.6 --   Liver Function Tests:  Lab 07/04/12 1631  AST 28  ALT 24  ALKPHOS 122*  BILITOT 0.3  PROT 7.6  ALBUMIN 3.0*   No results found for this basename:  LIPASE:5,AMYLASE:5 in the last 168 hours No results found for this basename: AMMONIA:5 in the last 168 hours CBC:  Lab 07/07/12 0350 07/05/12 0400 07/04/12 1200  WBC 7.0 7.0 8.7  NEUTROABS -- -- 5.8  HGB 14.5 14.5 16.6  HCT 42.7 43.3 47.8  MCV 100.5* 100.7* 99.4  PLT 283 235 257   Cardiac Enzymes: No results found for this basename: CKTOTAL:5,CKMB:5,CKMBINDEX:5,TROPONINI:5 in the last 168 hours BNP (last 3 results) No results found for this basename: PROBNP:3 in the last 8760 hours CBG: No results found for this basename: GLUCAP:5 in the last 168 hours  No  results found for this or any previous visit (from the past 240 hour(s)).   Studies: Dg Foot 2 Views Left  07/04/2012  *RADIOLOGY REPORT*  Clinical Data: Nonhealing wound of the left great toe.  LEFT FOOT - 2 VIEW  Comparison: None.  Findings: There is no visible osteomyelitis of the left great toe. Mild degenerative changes of the first metatarsal phalangeal joint. 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  Minimal spurring at the dorsal aspect of the intertarsal and tarsometatarsal joints.  IMPRESSION: No evidence of osteomyelitis of the left great toe.  Original Report Authenticated By: Gwynn Burly, M.D.   Dg Foot 2 Views Right  07/04/2012  *RADIOLOGY REPORT*  Clinical Data: Nonhealing wound on the soft tissues of the right great toe.  RIGHT FOOT - 2 VIEW  Comparison: None.  Findings: There is erosion of the dorsal aspect of the tuft of the distal phalangeal bone of the great toe seen only on the lateral view.  The bones of the great toe are otherwise normal.  Minimal degenerative changes of the head of the first metatarsal.  Slight degenerative changes are present in the tarsal joints. Small plantar calcaneal spur.  IMPRESSION: Findings consistent with focal osteomyelitis of the dorsal aspect of the tuft of the distal phalangeal bone of the great toe.  Original Report Authenticated By: Gwynn Burly, M.D.    Scheduled Meds:    . enoxaparin (LOVENOX) injection  60 mg Subcutaneous Q24H  . nicotine  14 mg Transdermal Daily  . piperacillin-tazobactam (ZOSYN)  IV  3.375 g Intravenous Q8H  . senna  1 tablet Oral Daily  . vancomycin  1,250 mg Intravenous Q12H  . DISCONTD: enoxaparin (LOVENOX) injection  40 mg Subcutaneous Q24H   Continuous Infusions:    . sodium chloride 250 mL (07/08/12 0513)    Principal Problem:  *Cellulitis Active Problems:  NICOTINE ADDICTION  HYPERTENSION, BENIGN  Neuropathy  Hyperglycemia    Time  spent:    Kela Millin  [facility] Pager [phone]. If 8PM-8AM, please contact night-coverage at [email or web address], password [facility] 07/08/2012, 5:44 PM  LOS: 4 days

## 2012-07-08 NOTE — Progress Notes (Signed)
Pharmacy - Lovenox for VTE ppx  Lovenox dose = 40mg  sq q24h, pharmacy may adjust CBC ok, no bleeding reported Scr wnl, CrCl > 150ml/min Wt 127kg, BMI 31  Plan: Will adjust dose for obesity  0.5mg /kg q24h = 60mg  sq q24h  Gwen Her PharmD  [phone] 07/08/2012 1:42 PM

## 2012-07-09 DIAGNOSIS — B070 Plantar wart: Secondary | ICD-10-CM | POA: Diagnosis present

## 2012-07-09 LAB — GLUCOSE, CAPILLARY: Glucose-Capillary: 98 mg/dL (ref 70–99)

## 2012-07-09 LAB — BASIC METABOLIC PANEL
BUN: 8 mg/dL (ref 6–23)
CO2: 30 mEq/L (ref 19–32)
Calcium: 8.6 mg/dL (ref 8.4–10.5)
Chloride: 103 mEq/L (ref 96–112)
Creatinine, Ser: 0.98 mg/dL (ref 0.50–1.35)
GFR calc Af Amer: >90 mL/min (ref >90)
GFR calc non Af Amer: >90 mL/min (ref >90)
Glucose, Bld: 102 mg/dL — ABNORMAL HIGH (ref 70–99)
Potassium: 4.2 mEq/L (ref 3.5–5.1)
Sodium: 140 mEq/L (ref 135–145)

## 2012-07-09 MED ORDER — HYDROMORPHONE HCL 2 MG PO TABS: 2.0000 mg | ORAL_TABLET | ORAL | Status: AC | PRN

## 2012-07-09 MED ORDER — DOXYCYCLINE HYCLATE 100 MG PO TABS: 100.0000 mg | ORAL_TABLET | Freq: Two times a day (BID) | ORAL | Status: AC

## 2012-07-09 MED ORDER — HYDROMORPHONE HCL 2 MG PO TABS
2.0000 mg | ORAL_TABLET | ORAL | Status: DC | PRN
  Administered 2012-07-09: 4 mg via ORAL
  Administered 2012-07-09: 2 mg via ORAL
  Filled 2012-07-09: qty 1
  Filled 2012-07-09: qty 2

## 2012-07-09 MED ORDER — FUROSEMIDE 20 MG PO TABS
30.0000 mg | ORAL_TABLET | Freq: Once | ORAL | Status: AC
  Administered 2012-07-09: 30 mg via ORAL
  Filled 2012-07-09: qty 1.5

## 2012-07-09 MED ORDER — SENNA 8.6 MG PO TABS: 1.0000 | ORAL_TABLET | Freq: Every day | ORAL | Status: DC

## 2012-07-09 NOTE — Progress Notes (Signed)
CM spoke with patient concerning dc planning. Per patient currently unemployed due to health condition. Patient states wanting to apply for SSI/Disabilily. Patient interviewed by financial counselor during hospitalization, provided with address for SSI Dept. Cm provided pt with information concerning DSS for medical insurance/emergency assistance. Per pt choice [facility] to provide [facility] services. [facility] notified of new referral, CM notified [facility] patient's needs for indigent assistance. Patient informed process may take a week before services are provided pending indigent approval. Patient to self administer daily dressing changes until [facility] services approved. Patient demonstrated dressing change to CM during interview. [facility] [address]. Patient provided with information on Jovita Kussmaul for PCP follow-up.   Leonie Green [phone]

## 2012-07-09 NOTE — Discharge Summary (Signed)
Physician Discharge Summary  [name] AVW:[id number] DOB: September 08, 1956 DOA: 07/04/2012  PCP: No primary provider on file.  Admit date: 07/04/2012 Discharge date: 07/09/2012  Recommendations for Outpatient Follow-up:  Follow-up Information    Please follow up. (PCP(info given per case management), call for appt upon discharge)       Follow up with Nestor Lewandowsky, MD. (is 2-3weeks, call for appt upon discharge)    Contact information:   [facility] [address] [place] [address] [phone]           Discharge Diagnoses:  Principal Problem:  *Cellulitis Active Problems:  NICOTINE ADDICTION  HYPERTENSION, BENIGN  Neuropathy  Hyperglycemia   Discharge Condition: Improved/stable  Diet recommendation: Regular  Filed Weights   07/04/12 1054 07/04/12 1508  Weight: 127.007 kg (280 lb) 127.007 kg (280 lb)    History of present illness:  Pt IS 56y/o male with PMH od tobacco abuse, neuropathy in both feet admitted with a 2-3 day h/o worsening pain, swelling and erythema on his LLE. Patient reports associated fever at home. On exam in ED he was found to have bilateral chronic open wound on his big toes and onychomycosis- pt repots he has had these ulcers on nad off over a 13yr period.   Hospital Course by problem list:  -Cellulitis/great toe ulcers:  -Upon admission he was started on empiric antibiotics with vancomycin for cellulitis. - foot x-rays were done and were negative for osteo, pt remained afebrile and remains hemodynamically stable  -ABI was done and came back within normal limits. -Erythema edema and pain were slow to improve and so Zosyn was added to his antibiotic regimen and his pain medications adjusted. -wound care was consulted regarding the ulcerated hyperkeratotic lesions on both great toes and his outpatient, recommended wet-to-dry dressing changes as well as orthopedic consultation.  -Orthopedics consulted on 8/8,  patient seen by Dr. Turner Daniels and per his evaluation-findings consistent with warts recommending outpatient followup at his office after some cellulitis clears up for definitive surgical management.  -His symptoms are clinically improved at this time, his manager to assist with discharge medications and also to set up for home health RN.  -He was also given in for for outpatient followup with PCP per case manager. -He'll be discharged on oral antibiotics with doxycycline and is to follow up outpatient. -Continue local wound care  2-NICOTINE ADDICTION: counseling provided on admission for patient to quit tobacco..  3-HYPERTENSION, BENIGN: on and off according to patient. Currently has not been using any antihypertensive drug. BP remaining controlled so far on no meds. He is to follow up outpatient with PCP for further monitoring and treatment as appropriate  4-Neuropathy: B12 and TSH wnl. ABI was done and came back within normal limits. -Is to follow up outpatient with PCP 5-Hyperglycemia: possibly stress hyperglycemia, AIC wnl at 5.4 and no further hyperglycemia  6-Plantar warts, Bilateral great toes:-Outpatient followup with Dr. Turner Daniels as above.     Procedures: Lower Extremity Arterial Evaluation/ABI  Summary: The right ABI=1.14, the left ABI=1.06, which is within normal limits bilaterally.     Consultations:  Orthopedics-Dr. Turner Daniels  wound care  Discharge Exam: Filed Vitals:   07/09/12 0515  BP: 125/79  Pulse: 68  Temp: 97.5 F (36.4 C)  Resp: 18   Filed Vitals:   07/08/12 0534 07/08/12 1446 07/08/12 2026 07/09/12 0515  BP: 125/80 125/83 110/72 125/79  Pulse: 68 64 70 68  Temp: 97.2 F (36.2 C) 98 F (36.7 C)  97.5 F (36.4 C) 97.5 F (36.4 C)  TempSrc: Oral Oral Oral Oral  Resp: 16 18 18 18   Height:      Weight:      SpO2: 94% 99% 97% 96%    Exam:  General: older WM, in NAD  Cardiovascular: RRR, nl S1, S2  Respiratory: clear, no wheezes  Abdomen: soft, +BS, NT/ND    EXT: LLE From mid leg with +2edema erythema and tenderness. Great toes bil. With fungating appearing ulcers with overlying dry hyperkeratotic skin.  Discharge Instructions  Discharge Orders    Future Orders Please Complete By Expires   Diet general      Increase activity slowly        Medication List  As of 07/09/2012 12:08 PM   TAKE these medications         doxycycline 100 MG tablet   Commonly known as: VIBRA-TABS   Take 1 tablet (100 mg total) by mouth 2 (two) times daily.      HYDROmorphone 2 MG tablet   Commonly known as: DILAUDID   Take 1-2 tablets (2-4 mg total) by mouth every 4 (four) hours as needed.      senna 8.6 MG Tabs   Commonly known as: SENOKOT   Take 1 tablet (8.6 mg total) by mouth daily.           Follow-up Information    Please follow up. (PCP(info given per case management), call for appt upon discharge)       Follow up with Nestor Lewandowsky, MD. (is 2-3weeks, call for appt upon discharge)    Contact information:   [facility] [address] [place] [address] [phone]           The results of significant diagnostics from this hospitalization (including imaging, microbiology, ancillary and laboratory) are listed below for reference.    Significant Diagnostic Studies: Dg Foot 2 Views Left  07/04/2012  *RADIOLOGY REPORT*  Clinical Data: Nonhealing wound of the left great toe.  LEFT FOOT - 2 VIEW  Comparison: None.  Findings: There is no visible osteomyelitis of the left great toe. Mild degenerative changes of the first metatarsal phalangeal joint. 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  Minimal spurring at the dorsal aspect of the intertarsal and tarsometatarsal joints.  IMPRESSION: No evidence of osteomyelitis of the left great toe.  Original Report Authenticated By: Gwynn Burly, M.D.   Dg Foot 2 Views Right  07/04/2012  *RADIOLOGY REPORT*   Clinical Data: Nonhealing wound on the soft tissues of the right great toe.  RIGHT FOOT - 2 VIEW  Comparison: None.  Findings: There is erosion of the dorsal aspect of the tuft of the distal phalangeal bone of the great toe seen only on the lateral view.  The bones of the great toe are otherwise normal.  Minimal degenerative changes of the head of the first metatarsal.  Slight degenerative changes are present in the tarsal joints. Small plantar calcaneal spur.  IMPRESSION: Findings consistent with focal osteomyelitis of the dorsal aspect of the tuft of the distal phalangeal bone of the great toe.  Original Report Authenticated By: Gwynn Burly, M.D.    Microbiology: No results found for this or any previous visit (from the past 240 hour(s)).   Labs: Basic Metabolic Panel:  Lab 07/09/12 8295 07/07/12 0350 07/05/12 0400 07/04/12 1631 07/04/12 1200  NA 140 138 134* -- 134*  K 4.2  4.1 4.6 -- 3.6  CL 103 102 101 -- 98  CO2 30 27 28  -- 26  GLUCOSE 102* 96 107* -- 110*  BUN 8 6 9  -- 9  CREATININE 0.98 0.78 0.96 -- 0.93  CALCIUM 8.6 8.5 8.4 -- 9.2  MG -- -- -- 2.4 --  PHOS -- -- -- 3.6 --   Liver Function Tests:  Lab 07/04/12 1631  AST 28  ALT 24  ALKPHOS 122*  BILITOT 0.3  PROT 7.6  ALBUMIN 3.0*   No results found for this basename: LIPASE:5,AMYLASE:5 in the last 168 hours No results found for this basename: AMMONIA:5 in the last 168 hours CBC:  Lab 07/07/12 0350 07/05/12 0400 07/04/12 1200  WBC 7.0 7.0 8.7  NEUTROABS -- -- 5.8  HGB 14.5 14.5 16.6  HCT 42.7 43.3 47.8  MCV 100.5* 100.7* 99.4  PLT 283 235 257   Cardiac Enzymes: No results found for this basename: CKTOTAL:5,CKMB:5,CKMBINDEX:5,TROPONINI:5 in the last 168 hours BNP: BNP (last 3 results) No results found for this basename: PROBNP:3 in the last 8760 hours CBG:  Lab 07/09/12 0727  GLUCAP 98    Time coordinating discharge: >14minutes  Signed:  Sawyer Mentzer C  [facility] 07/09/2012, 12:08  PM

## 2013-05-30 ENCOUNTER — Encounter (HOSPITAL_COMMUNITY): Payer: Self-pay | Admitting: Emergency Medicine

## 2013-05-30 ENCOUNTER — Emergency Department (HOSPITAL_COMMUNITY): Payer: Self-pay

## 2013-05-30 ENCOUNTER — Emergency Department (HOSPITAL_COMMUNITY)
Admission: EM | Admit: 2013-05-30 | Discharge: 2013-05-30 | Disposition: A | Discharge status: Home / Self Care | Payer: Self-pay | Attending: Emergency Medicine | Admitting: Emergency Medicine

## 2013-05-30 DIAGNOSIS — X58XXXA Exposure to other specified factors, initial encounter: Secondary | ICD-10-CM | POA: Insufficient documentation

## 2013-05-30 DIAGNOSIS — I10 Essential (primary) hypertension: Secondary | ICD-10-CM | POA: Insufficient documentation

## 2013-05-30 DIAGNOSIS — M869 Osteomyelitis, unspecified: Secondary | ICD-10-CM

## 2013-05-30 DIAGNOSIS — L539 Erythematous condition, unspecified: Secondary | ICD-10-CM | POA: Insufficient documentation

## 2013-05-30 DIAGNOSIS — L97519 Non-pressure chronic ulcer of other part of right foot with unspecified severity: Secondary | ICD-10-CM

## 2013-05-30 DIAGNOSIS — R209 Unspecified disturbances of skin sensation: Secondary | ICD-10-CM | POA: Insufficient documentation

## 2013-05-30 DIAGNOSIS — IMO0001 Reserved for inherently not codable concepts without codable children: Secondary | ICD-10-CM | POA: Insufficient documentation

## 2013-05-30 DIAGNOSIS — Y929 Unspecified place or not applicable: Secondary | ICD-10-CM | POA: Insufficient documentation

## 2013-05-30 DIAGNOSIS — Y9383 Activity, rough housing and horseplay: Secondary | ICD-10-CM | POA: Insufficient documentation

## 2013-05-30 DIAGNOSIS — Z8701 Personal history of pneumonia (recurrent): Secondary | ICD-10-CM | POA: Insufficient documentation

## 2013-05-30 DIAGNOSIS — F17200 Nicotine dependence, unspecified, uncomplicated: Secondary | ICD-10-CM | POA: Insufficient documentation

## 2013-05-30 DIAGNOSIS — G589 Mononeuropathy, unspecified: Secondary | ICD-10-CM | POA: Insufficient documentation

## 2013-05-30 LAB — POCT I-STAT, CHEM 8
BUN: 15 mg/dL (ref 6–23)
Calcium, Ion: 1.06 mmol/L — ABNORMAL LOW (ref 1.12–1.23)
Chloride: 103 mEq/L (ref 96–112)
Creatinine, Ser: 1.2 mg/dL (ref 0.50–1.35)
Glucose, Bld: 101 mg/dL — ABNORMAL HIGH (ref 70–99)
HCT: 52 % (ref 39.0–52.0)
Hemoglobin: 17.7 g/dL — ABNORMAL HIGH (ref 13.0–17.0)
Potassium: 4.6 mEq/L (ref 3.5–5.1)
Sodium: 139 mEq/L (ref 135–145)
TCO2: 24 mmol/L (ref 0–100)

## 2013-05-30 LAB — CBC WITH DIFFERENTIAL/PLATELET
Basophils Absolute: 0 10*3/uL (ref 0.0–0.1)
Basophils Relative: 0 % (ref 0–1)
Eosinophils Absolute: 0.1 10*3/uL (ref 0.0–0.7)
Eosinophils Relative: 1 % (ref 0–5)
HCT: 49.3 % (ref 39.0–52.0)
Hemoglobin: 17 g/dL (ref 13.0–17.0)
Lymphocytes Relative: 30 % (ref 12–46)
Lymphs Abs: 2.3 10*3/uL (ref 0.7–4.0)
MCH: 31.2 pg (ref 26.0–34.0)
MCHC: 34.5 g/dL (ref 30.0–36.0)
MCV: 90.5 fL (ref 78.0–100.0)
Monocytes Absolute: 0.5 10*3/uL (ref 0.1–1.0)
Monocytes Relative: 7 % (ref 3–12)
Neutro Abs: 4.8 10*3/uL (ref 1.7–7.7)
Neutrophils Relative %: 62 % (ref 43–77)
Platelets: 276 10*3/uL (ref 150–400)
RBC: 5.45 MIL/uL (ref 4.22–5.81)
RDW: 13.5 % (ref 11.5–15.5)
WBC: 7.8 10*3/uL (ref 4.0–10.5)

## 2013-05-30 MED ORDER — CIPROFLOXACIN HCL 500 MG PO TABS: 500.0000 mg | ORAL_TABLET | Freq: Two times a day (BID) | ORAL | Status: DC

## 2013-05-30 MED ORDER — CLINDAMYCIN HCL 300 MG PO CAPS
300.0000 mg | ORAL_CAPSULE | Freq: Once | ORAL | Status: AC
  Administered 2013-05-30: 300 mg via ORAL
  Filled 2013-05-30: qty 1

## 2013-05-30 MED ORDER — CIPROFLOXACIN HCL 500 MG PO TABS
500.0000 mg | ORAL_TABLET | Freq: Once | ORAL | Status: AC
  Administered 2013-05-30: 500 mg via ORAL
  Filled 2013-05-30: qty 1

## 2013-05-30 MED ORDER — CLINDAMYCIN HCL 150 MG PO CAPS: 150.0000 mg | ORAL_CAPSULE | Freq: Four times a day (QID) | ORAL | Status: DC

## 2013-05-30 MED ORDER — HYDROCODONE-ACETAMINOPHEN 5-325 MG PO TABS
1.0000 | ORAL_TABLET | Freq: Once | ORAL | Status: AC
  Administered 2013-05-30: 1 via ORAL
  Filled 2013-05-30: qty 1

## 2013-05-30 MED ORDER — OXYCODONE-ACETAMINOPHEN 5-325 MG PO TABS
1.0000 | ORAL_TABLET | Freq: Once | ORAL | Status: AC
  Administered 2013-05-30: 1 via ORAL
  Filled 2013-05-30: qty 1

## 2013-05-30 MED ORDER — OXYCODONE-ACETAMINOPHEN 5-325 MG PO TABS: 2.0000 | ORAL_TABLET | ORAL | Status: DC | PRN

## 2013-05-30 NOTE — ED Provider Notes (Signed)
History    CSN: [id number] Arrival date & time 05/30/13  1229  First MD Initiated Contact with Patient 05/30/13 1250     Chief Complaint  Patient presents with  . sores on foot    (Consider location/radiation/quality/duration/timing/severity/associated sxs/prior Treatment) HPI  57 year old male with history of neuropathy in both feet presents for evaluations of open sores on his feet. Patient states he has had recurrent sores on both of his great toes ongoing since 2005. States sores as gotten worse and in the past 2-3 days with increasing pain. Describe pain as a throbbing achy sensation, nonradiating, worsening with walking and improves when he elevates his leg. Pain is currently 7/10. Does have history of nephropathy but denies history of diabetes. Denies fever, chills, the onset of numbness or weakness. Denies any recent injury. Has been treated his toes with water and Epsom salts and also soap which has helped in the past but not currently.  Patient denies any specific trauma. He denies any joint pain or rash.  Past Medical History  Diagnosis Date  . Hypertension   . Pneumonia 2011  . Neuropathy     in both feet   Past Surgical History  Procedure Laterality Date  . Tooth extraction  2011    multiple teeth extracted   No family history on file. History  Substance Use Topics  . Smoking status: Current Every Day Smoker -- 0.50 packs/day for 30 years  . Smokeless tobacco: Never Used  . Alcohol Use: Yes     Comment: rarely    Review of Systems  Constitutional: Negative for fever.  Musculoskeletal: Positive for myalgias.  Skin: Negative for rash.  Neurological: Positive for numbness.    Allergies  Review of patient's allergies indicates no known allergies.  Home Medications   Current Outpatient Rx  Name  Route  Sig  Dispense  Refill  . senna (SENOKOT) 8.6 MG TABS   Oral   Take 1 tablet (8.6 mg total) by mouth daily.   30 each   0    BP 176/96  Pulse 106   Temp(Src) 98.8 F (37.1 C) (Oral)  Resp 18  Ht 6\' 8"  (2.032 m)  Wt 280 lb (127.007 kg)  BMI 30.76 kg/m2  SpO2 96% Physical Exam  Nursing note and vitals reviewed. Constitutional: He appears well-developed and well-nourished. No distress.  HENT:  Head: Atraumatic.  Eyes: Conjunctivae are normal.  Neck: Neck supple.  Musculoskeletal: He exhibits tenderness (great toes:  a quarter size necrotic lesion noted to medial aspect of both great toes, ttp, no exudates, no fb.  mild erythema noted to dorsum of R great toe.  ).  Neurological: He is alert.  Subject paresthesia to tip of toes extending to mid dorsum of both feet.  Skin: Skin is warm.  Psychiatric: He has a normal mood and affect.    ED Course  Procedures (including critical care time)  Pt with obvious necrotic tissue to medial aspect of both great toes along with subjective paresthesia.  However good DP and DT pulses to both leg.  Will obtain xray to r/o osteo.  Pain medication given.  Will also start pt out with clinda/cipro.    3:08 PM X-ray shows soft tissue defects involving both great toes. Plain film findings suspicious of osteomyelitis involving the distal dorsal tuft of the right great toe. Patient is afebrile, normal WBC and labs otherwise reassuring. Normal CBG. Will consult ortho for further management  3:54 PM  Plan to treat with  clinda/cipro combo for "diabetic foot ulcer".  Care discussed with attending.  I have also consulted with orthopedist Dr. Ranell Patrick, who recommend pt to f/u in his office at 10am tomorrow for further management.  Will d/c with clinda/cipro and pain medication.  Return precaution discussed.      Labs Reviewed  POCT I-STAT, CHEM 8 - Abnormal; Notable for the following:    Glucose, Bld 101 (*)    Calcium, Ion 1.06 (*)    Hemoglobin 17.7 (*)    All other components within normal limits  CBC WITH DIFFERENTIAL   Dg Toe Great Left  05/30/2013   *RADIOLOGY REPORT*  Clinical Data: Bilateral great  toe pain, swelling and open wound.  RIGHT GREAT TOE,LEFT GREAT TOE  Comparison: 07/04/2012.  Findings: There are soft tissue defects involving both great toes with air in the soft tissues likely related to the open wound.  The lateral film of the right foot demonstrates a defect involving the dorsal cortex which is worrisome for osteomyelitis.  No obvious destructive changes involving the left great toe.  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  There is also cortical thickening involving the proximal phalanx of the second toe which could be related to prior trauma.  IMPRESSION:  1.  Soft tissue defects involving both great toes. 2.  Plain film findings suspicious for osteomyelitis involving the distal dorsal tuft of the right great toe.   Original Report Authenticated By: Rudie Meyer, M.D.   Dg Toe Great Right  05/30/2013   *RADIOLOGY REPORT*  Clinical Data: Bilateral great toe pain, swelling and open wound.  RIGHT GREAT TOE,LEFT GREAT TOE  Comparison: 07/04/2012.  Findings: There are soft tissue defects involving both great toes with air in the soft tissues likely related to the open wound.  The lateral film of the right foot demonstrates a defect involving the dorsal cortex which is worrisome for osteomyelitis.  No obvious destructive changes involving the left great toe.  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  There is also cortical thickening involving the proximal phalanx of the second toe which could be related to prior trauma.  IMPRESSION:  1.  Soft tissue defects involving both great toes. 2.  Plain film findings suspicious for osteomyelitis involving the distal dorsal tuft of the right great toe.   Original Report Authenticated By: Rudie Meyer, M.D.   1. Bilateral great toes ulcers   2. Osteomyelitis of toe of right foot     MDM  BP  176/96  Pulse 106  Temp(Src) 98.8 F (37.1 C) (Oral)  Resp 18  Ht 6\' 8"  (2.032 m)  Wt 280 lb (127.007 kg)  BMI 30.76 kg/m2  SpO2 96%  I have reviewed nursing notes and vital signs. I personally reviewed the imaging tests through PACS system  I reviewed available ER/hospitalization records thought the EMR   Fayrene Helper, [place] 05/30/13 1558

## 2013-05-30 NOTE — ED Notes (Signed)
Per patient, has had sores on both feet on and off since 2005-has be evaluated in past-states sores are open and draining(one sore on each great toe)

## 2013-05-30 NOTE — ED Provider Notes (Signed)
Medical screening examination/treatment/procedure(s) were performed by non-physician practitioner and as supervising physician I was immediately available for consultation/collaboration.  Ethelda Chick, MD 05/30/13 [phone]

## 2013-05-30 NOTE — Progress Notes (Signed)
[facility] has seen patient and provided him with a oc application. °

## 2013-05-31 ENCOUNTER — Encounter (HOSPITAL_COMMUNITY): Payer: Self-pay | Admitting: Pharmacy Technician

## 2013-06-04 ENCOUNTER — Encounter (HOSPITAL_COMMUNITY): Payer: Self-pay | Admitting: *Deleted

## 2013-06-04 NOTE — Progress Notes (Signed)
Pt denies SOB, chest pain, and being under the care of a cardiologist. According to pt, "I had a stress test in [place] around 2007. " Pt denies ever having any other cardiac studies done ( echo or cardiac cath). Stop taking Aspirin and herbal medications. Do not take any NSAIDs ie: Ibuprofen, Advil, Naproxen or any medication containing Aspirin.

## 2013-06-05 ENCOUNTER — Encounter (HOSPITAL_COMMUNITY): Payer: Self-pay | Admitting: *Deleted

## 2013-06-05 ENCOUNTER — Ambulatory Visit (HOSPITAL_COMMUNITY): Payer: Self-pay | Admitting: Anesthesiology

## 2013-06-05 ENCOUNTER — Ambulatory Visit (HOSPITAL_COMMUNITY): Payer: Self-pay

## 2013-06-05 ENCOUNTER — Ambulatory Visit (HOSPITAL_COMMUNITY)
Admission: RE | Admit: 2013-06-05 | Discharge: 2013-06-06 | Disposition: A | Discharge status: Home / Self Care | Payer: MEDICAID | Source: Ambulatory Visit | Attending: Orthopedic Surgery | Admitting: Orthopedic Surgery

## 2013-06-05 ENCOUNTER — Encounter (HOSPITAL_COMMUNITY)
Admission: RE | Disposition: A | Discharge status: Home / Self Care | Payer: Self-pay | Source: Ambulatory Visit | Attending: Orthopedic Surgery

## 2013-06-05 ENCOUNTER — Encounter (HOSPITAL_COMMUNITY): Payer: Self-pay | Admitting: Anesthesiology

## 2013-06-05 DIAGNOSIS — G609 Hereditary and idiopathic neuropathy, unspecified: Secondary | ICD-10-CM | POA: Insufficient documentation

## 2013-06-05 DIAGNOSIS — M869 Osteomyelitis, unspecified: Secondary | ICD-10-CM | POA: Insufficient documentation

## 2013-06-05 DIAGNOSIS — M9080 Osteopathy in diseases classified elsewhere, unspecified site: Secondary | ICD-10-CM | POA: Insufficient documentation

## 2013-06-05 DIAGNOSIS — L97509 Non-pressure chronic ulcer of other part of unspecified foot with unspecified severity: Secondary | ICD-10-CM | POA: Insufficient documentation

## 2013-06-05 DIAGNOSIS — I10 Essential (primary) hypertension: Secondary | ICD-10-CM | POA: Insufficient documentation

## 2013-06-05 DIAGNOSIS — L84 Corns and callosities: Secondary | ICD-10-CM | POA: Insufficient documentation

## 2013-06-05 DIAGNOSIS — G629 Polyneuropathy, unspecified: Secondary | ICD-10-CM

## 2013-06-05 DIAGNOSIS — E1169 Type 2 diabetes mellitus with other specified complication: Secondary | ICD-10-CM | POA: Insufficient documentation

## 2013-06-05 HISTORY — PX: TOE AMPUTATION: SHX809

## 2013-06-05 HISTORY — PX: AMPUTATION: SHX166

## 2013-06-05 SURGERY — AMPUTATION, FOOT, RAY
Anesthesia: General | Site: Toe | Laterality: Bilateral | Wound class: Dirty or Infected

## 2013-06-05 MED ORDER — SODIUM CHLORIDE 0.9 % IV SOLN
INTRAVENOUS | Status: DC
  Administered 2013-06-05 – 2013-06-06 (×2): via INTRAVENOUS

## 2013-06-05 MED ORDER — MIDAZOLAM HCL 5 MG/5ML IJ SOLN
INTRAMUSCULAR | Status: DC | PRN
  Administered 2013-06-05: 2 mg via INTRAVENOUS

## 2013-06-05 MED ORDER — DOUBLE ANTIBIOTIC 500-10000 UNIT/GM EX OINT
TOPICAL_OINTMENT | CUTANEOUS | Status: AC
  Filled 2013-06-05: qty 1

## 2013-06-05 MED ORDER — 0.9 % SODIUM CHLORIDE (POUR BTL) OPTIME
TOPICAL | Status: DC | PRN
  Administered 2013-06-05: 1000 mL

## 2013-06-05 MED ORDER — VITAMIN B-12 100 MCG PO TABS
50.0000 ug | ORAL_TABLET | Freq: Every morning | ORAL | Status: DC
  Administered 2013-06-06: 50 ug via ORAL
  Filled 2013-06-05: qty 1

## 2013-06-05 MED ORDER — HYDROCODONE-ACETAMINOPHEN 5-325 MG PO TABS
1.0000 | ORAL_TABLET | ORAL | Status: DC | PRN
  Administered 2013-06-05 – 2013-06-06 (×4): 2 via ORAL
  Filled 2013-06-05 (×4): qty 2

## 2013-06-05 MED ORDER — FENTANYL CITRATE 0.05 MG/ML IJ SOLN
INTRAMUSCULAR | Status: DC | PRN
  Administered 2013-06-05 (×3): 50 ug via INTRAVENOUS

## 2013-06-05 MED ORDER — BACITRACIN ZINC 500 UNIT/GM EX OINT
TOPICAL_OINTMENT | CUTANEOUS | Status: DC | PRN
  Administered 2013-06-05: 2 via TOPICAL

## 2013-06-05 MED ORDER — PROPOFOL INFUSION 10 MG/ML OPTIME
INTRAVENOUS | Status: DC | PRN
  Administered 2013-06-05: 100 ug/kg/min via INTRAVENOUS

## 2013-06-05 MED ORDER — ADULT MULTIVITAMIN W/MINERALS CH
1.0000 | ORAL_TABLET | Freq: Every morning | ORAL | Status: DC
  Administered 2013-06-06: 1 via ORAL
  Filled 2013-06-05: qty 1

## 2013-06-05 MED ORDER — LACTATED RINGERS IV SOLN
INTRAVENOUS | Status: DC
  Administered 2013-06-05: 15:00:00 via INTRAVENOUS

## 2013-06-05 MED ORDER — ONDANSETRON HCL 4 MG/2ML IJ SOLN: 4.0000 mg | Freq: Once | INTRAMUSCULAR | Status: DC | PRN

## 2013-06-05 MED ORDER — CEFAZOLIN SODIUM-DEXTROSE 2-3 GM-% IV SOLR
2.0000 g | Freq: Four times a day (QID) | INTRAVENOUS | Status: AC
  Administered 2013-06-05 – 2013-06-06 (×3): 2 g via INTRAVENOUS
  Filled 2013-06-05 (×3): qty 50

## 2013-06-05 MED ORDER — LACTATED RINGERS IV SOLN
INTRAVENOUS | Status: DC | PRN
  Administered 2013-06-05: 15:00:00 via INTRAVENOUS

## 2013-06-05 MED ORDER — CEFAZOLIN SODIUM-DEXTROSE 2-3 GM-% IV SOLR
INTRAVENOUS | Status: AC
  Administered 2013-06-05: 2 g via INTRAVENOUS
  Filled 2013-06-05: qty 50

## 2013-06-05 MED ORDER — ONDANSETRON HCL 4 MG PO TABS: 4.0000 mg | ORAL_TABLET | Freq: Four times a day (QID) | ORAL | Status: DC | PRN

## 2013-06-05 MED ORDER — ONDANSETRON HCL 4 MG/2ML IJ SOLN: 4.0000 mg | Freq: Four times a day (QID) | INTRAMUSCULAR | Status: DC | PRN

## 2013-06-05 MED ORDER — HYDROMORPHONE HCL PF 1 MG/ML IJ SOLN: 0.2500 mg | INTRAMUSCULAR | Status: DC | PRN

## 2013-06-05 SURGICAL SUPPLY — 39 items
BANDAGE CONFORM 3  STR LF (GAUZE/BANDAGES/DRESSINGS) ×2 IMPLANT
BLADE LONG MED 31X9 (MISCELLANEOUS) ×2 IMPLANT
BLADE SURG 10 STRL SS (BLADE) ×4 IMPLANT
BNDG COHESIVE 4X5 TAN STRL (GAUZE/BANDAGES/DRESSINGS) ×2 IMPLANT
BNDG COHESIVE 6X5 TAN STRL LF (GAUZE/BANDAGES/DRESSINGS) ×2 IMPLANT
BNDG ESMARK 4X9 LF (GAUZE/BANDAGES/DRESSINGS) ×2 IMPLANT
CHLORAPREP W/TINT 26ML (MISCELLANEOUS) ×2 IMPLANT
CLOTH BEACON ORANGE TIMEOUT ST (SAFETY) ×2 IMPLANT
CUFF TOURNIQUET SINGLE 34IN LL (TOURNIQUET CUFF) IMPLANT
CUFF TOURNIQUET SINGLE 44IN (TOURNIQUET CUFF) IMPLANT
DRAPE U-SHAPE 47X51 STRL (DRAPES) ×4 IMPLANT
DRSG ADAPTIC 3X8 NADH LF (GAUZE/BANDAGES/DRESSINGS) ×8 IMPLANT
DRSG PAD ABDOMINAL 8X10 ST (GAUZE/BANDAGES/DRESSINGS) ×2 IMPLANT
ELECT REM PT RETURN 9FT ADLT (ELECTROSURGICAL) ×2
ELECTRODE REM PT RTRN 9FT ADLT (ELECTROSURGICAL) ×1 IMPLANT
GLOVE BIO SURGEON STRL SZ8 (GLOVE) ×4 IMPLANT
GLOVE BIOGEL PI IND STRL 8 (GLOVE) ×1 IMPLANT
GLOVE BIOGEL PI INDICATOR 8 (GLOVE) ×1
GOWN PREVENTION PLUS XLARGE (GOWN DISPOSABLE) ×2 IMPLANT
GOWN STRL NON-REIN LRG LVL3 (GOWN DISPOSABLE) ×2 IMPLANT
KIT BASIN OR (CUSTOM PROCEDURE TRAY) ×2 IMPLANT
KIT ROOM TURNOVER OR (KITS) ×2 IMPLANT
MANIFOLD NEPTUNE II (INSTRUMENTS) IMPLANT
NS IRRIG 1000ML POUR BTL (IV SOLUTION) ×2 IMPLANT
PACK ORTHO EXTREMITY (CUSTOM PROCEDURE TRAY) ×2 IMPLANT
PAD ARMBOARD 7.5X6 YLW CONV (MISCELLANEOUS) ×4 IMPLANT
PAD CAST 4YDX4 CTTN HI CHSV (CAST SUPPLIES) ×2 IMPLANT
PADDING CAST COTTON 4X4 STRL (CAST SUPPLIES) ×2
SPONGE GAUZE 4X4 12PLY (GAUZE/BANDAGES/DRESSINGS) ×4 IMPLANT
SPONGE LAP 18X18 X RAY DECT (DISPOSABLE) ×2 IMPLANT
STAPLER VISISTAT 35W (STAPLE) IMPLANT
STOCKINETTE IMPERVIOUS LG (DRAPES) ×4 IMPLANT
SUCTION FRAZIER TIP 10 FR DISP (SUCTIONS) ×2 IMPLANT
SUT ETHILON 2 0 PSLX (SUTURE) ×4 IMPLANT
TOWEL OR 17X24 6PK STRL BLUE (TOWEL DISPOSABLE) ×2 IMPLANT
TOWEL OR 17X26 10 PK STRL BLUE (TOWEL DISPOSABLE) ×2 IMPLANT
TUBE CONNECTING 12X1/4 (SUCTIONS) ×2 IMPLANT
UNDERPAD 30X30 INCONTINENT (UNDERPADS AND DIAPERS) ×2 IMPLANT
WATER STERILE IRR 1000ML POUR (IV SOLUTION) ×2 IMPLANT

## 2013-06-05 NOTE — Progress Notes (Signed)
Orthopedic Tech Progress Note Patient Details:  [name] 1956-04-05 [id number] Patient all ready has own shoes Patient ID: [name], male   DOB: September 12, 1956, 57 y.o.   MRN: [id number]   [name] 06/05/2013, 6:28 PM

## 2013-06-05 NOTE — Preoperative (Signed)
Beta Blockers   Reason not to administer Beta Blockers:Not Applicable 

## 2013-06-05 NOTE — H&P (Signed)
[name] is an 57 y.o. male.   Chief Complaint: bilat hallux ulcers HPI: 57 y/o male with right hallux osteomyelitis and left hallux ulcer both neuropathic in origin.  Past Medical History  Diagnosis Date  . Pneumonia 2011  . Neuropathy     in both feet  . Hypertension     has previously been on medication but then taken off    Past Surgical History  Procedure Laterality Date  . Tooth extraction  2011    multiple teeth extracted    Family History  Problem Relation Age of Onset  . Heart disease Mother   . Cancer Father   . Diabetes Father   . Cancer Sister    Social History:  reports that he quit smoking about 5 weeks ago. He has never used smokeless tobacco. He reports that  drinks alcohol. He reports that he does not use illicit drugs.  Allergies: No Known Allergies  Medications Prior to Admission  Medication Sig Dispense Refill  . ciprofloxacin (CIPRO) 500 MG tablet Take 500 mg by mouth 2 (two) times daily. For 10 days; Start date 05/30/13      . clindamycin (CLEOCIN) 150 MG capsule Take 1 capsule (150 mg total) by mouth every 6 (six) hours.  28 capsule  0  . ibuprofen (ADVIL,MOTRIN) 200 MG tablet Take 400 mg by mouth every 6 (six) hours as needed for pain.      Marland Kitchen oxyCODONE-acetaminophen (PERCOCET/ROXICET) 5-325 MG per tablet Take 2 tablets by mouth every 4 (four) hours as needed for pain.  15 tablet  0  . vitamin B-12 (CYANOCOBALAMIN) 100 MCG tablet Take 50 mcg by mouth every morning.      . Multiple Vitamin (MULTIVITAMIN WITH MINERALS) TABS Take 1 tablet by mouth every morning.        No results found for this or any previous visit (from the past 48 hour(s)). Dg Chest 2 View  09-Jun-2013   *RADIOLOGY REPORT*  Clinical Data: History of hypertension, ex-smoker, to amputate right great toe.  CHEST - 2 VIEW  Comparison: 10/20/2010  Findings: Cardiomediastinal silhouette is stable.  Mild hyperinflation.  No acute infiltrate or pleural effusion.  No pulmonary edema.  Stable  mild right perihilar scarring.  IMPRESSION: No active disease.  No significant change.   Original Report Authenticated By: Natasha Mead, M.D.    ROS  No recent f/c/n/v/wt loss  Blood pressure 152/99, pulse 74, temperature 98 F (36.7 C), temperature source Oral, resp. rate 18, height 6\' 8"  (2.032 m), weight 124.739 kg (275 lb), SpO2 98.00%. Physical Exam  wn wd male in nad.  A and O x 4.  Mood and affec t normal.  EOMi.  Rsp unlabored.  Bilat hallux with ulcers.  Skin intact otherwise.  1+ dp pulses.  Diminished sens to LT in forefeet.  5/5 strength in PF and DF of ankles.  Assessment/Plan bilat hallux ulcers - to OR for R hallux amputation and left hallux I and D.  The risks and benefits of the alternative treatment options have been discussed in detail.  The patient wishes to proceed with surgery and specifically understands risks of bleeding, infection, nerve damage, blood clots, need for additional surgery, amputation and death.   Toni Arthurs 06-09-2013, 4:28 PM

## 2013-06-05 NOTE — Progress Notes (Signed)
Per Dr. Katrinka Blazing do not repeat labs ok to use labs drawn on 05/30/13

## 2013-06-05 NOTE — Brief Op Note (Signed)
06/05/2013  5:24 PM  PATIENT:  [name]  57 y.o. male  PRE-OPERATIVE DIAGNOSIS:  RIGHT GREAT TOE OSTEOMYTILIS/LEFT GREAT TOE ULCER  POST-OPERATIVE DIAGNOSIS:  RIGHT GREAT TOE OSTEOMYTILIS/LEFT GREAT TOE ULCER  Procedure(s): 1.  Right hallux amputation through the proximal phalanx 2.  Irrigation and debridement of left hallux deep to the fascia  SURGEON:  Toni Arthurs, MD  ASSISTANT: n/a  ANESTHESIA:   Regional, MAC  EBL:  minimal   TOURNIQUET:   Total Tourniquet Time Documented: Calf (Right) - 19 minutes Total: Calf (Right) - 19 minutes   COMPLICATIONS:  None apparent  DISPOSITION:  Extubated, awake and stable to recovery.  DICTATION ID:  [id number]

## 2013-06-05 NOTE — Anesthesia Postprocedure Evaluation (Signed)
Anesthesia Post Note  Patient: [name]  Procedure(s) Performed: Procedure(s) (LRB): RIGHT GREAT TOE AMPUTATION/LEFT GREAT TOE DEBRIDEMENT (Bilateral)  Anesthesia type: MAC  Patient location: PACU  Post pain: Pain level controlled and Adequate analgesia  Post assessment: Post-op Vital signs reviewed, Patient's Cardiovascular Status Stable and Respiratory Function Stable  Last Vitals:  Filed Vitals:   06/05/13 1745  BP: 114/97  Pulse:   Temp: 36.6 C  Resp:     Post vital signs: Reviewed and stable  Level of consciousness: awake, alert  and oriented  Complications: No apparent anesthesia complications

## 2013-06-05 NOTE — Transfer of Care (Signed)
Immediate Anesthesia Transfer of Care Note  Patient: [name]  Procedure(s) Performed: Procedure(s): RIGHT GREAT TOE AMPUTATION/LEFT GREAT TOE DEBRIDEMENT (Bilateral)  Patient Location: PACU  Anesthesia Type:MAC  Level of Consciousness: awake, alert  and oriented  Airway & Oxygen Therapy: Patient Spontanous Breathing  Post-op Assessment: Report given to PACU RN  Post vital signs: stable  Complications: No apparent anesthesia complications

## 2013-06-05 NOTE — Anesthesia Procedure Notes (Addendum)
Anesthesia Regional Block:  Ankle block  Pre-Anesthetic Checklist: ,, timeout performed, Correct Patient, Correct Site, Correct Laterality, Correct Procedure, Correct Position, site marked, Risks and benefits discussed,  Surgical consent,  Pre-op evaluation,  At surgeon's request and post-op pain management  Laterality: Left and Right  Prep: Maximum Sterile Barrier Precautions used, chloraprep and alcohol swabs       Needles:  Injection technique: Single-shot      Needle Gauge: 25 and 25 G  Needle insertion depth: 2 cm   Additional Needles: Ankle block Narrative:  Start time: 06/05/2013 3:35 PM End time: 06/05/2013 3:44 PM Injection made incrementally with aspirations every 5 mL.  Performed by: Personally  Anesthesiologist: Maren Beach MD  Additional Notes: Pt accepts bilateral Ankle Blocks and risks. Ant/Post Tibial nerves R/L ankles. Total 25cc 0.5% Marcaine and 25cc 2% Lidocaine w/o difficulty or discomfort. GES   Procedure Name: MAC Date/Time: 06/05/2013 4:25 PM Performed by: Ellin Goodie Pre-anesthesia Checklist: Patient identified, Emergency Drugs available, Suction available, Patient being monitored and Timeout performed Patient Re-evaluated:Patient Re-evaluated prior to inductionOxygen Delivery Method: Simple face mask Preoxygenation: Pre-oxygenation with 100% oxygen Intubation Type: IV induction Placement Confirmation: positive ETCO2 Dental Injury: Teeth and Oropharynx as per pre-operative assessment

## 2013-06-05 NOTE — Progress Notes (Signed)
Patient reports living alone with no support systems. Notified Frann Rider, RN Case Manager for discharge planning and to follow up regarding discharge needs.

## 2013-06-05 NOTE — Anesthesia Preprocedure Evaluation (Addendum)
Anesthesia Evaluation  Patient identified by MRN, date of birth, ID band Patient awake    Reviewed: Allergy & Precautions, H&P , NPO status , Patient's Chart, lab work & pertinent test results  Airway Mallampati: II TM Distance: >3 FB Neck ROM: Full    Dental  (+) Edentulous Upper, Edentulous Lower and Dental Advisory Given   Pulmonary former smoker,  1 1/2 ppd x 20 years; quit 1 month ago         Cardiovascular hypertension, Rhythm:Regular Rate:Normal     Neuro/Psych    GI/Hepatic negative GI ROS, Neg liver ROS,   Endo/Other  negative endocrine ROS  Renal/GU negative Renal ROS     Musculoskeletal   Abdominal   Peds  Hematology negative hematology ROS (+)   Anesthesia Other Findings   Reproductive/Obstetrics negative OB ROS                           Anesthesia Physical Anesthesia Plan  ASA: II  Anesthesia Plan: MAC and Regional   Post-op Pain Management:    Induction: Intravenous  Airway Management Planned: Mask  Additional Equipment:   Intra-op Plan:   Post-operative Plan:   Informed Consent: I have reviewed the patients History and Physical, chart, labs and discussed the procedure including the risks, benefits and alternatives for the proposed anesthesia with the patient or authorized representative who has indicated his/her understanding and acceptance.     Plan Discussed with: CRNA, Anesthesiologist and Surgeon  Anesthesia Plan Comments:         Anesthesia Quick Evaluation

## 2013-06-06 ENCOUNTER — Encounter (HOSPITAL_COMMUNITY): Payer: Self-pay | Admitting: General Practice

## 2013-06-06 NOTE — Op Note (Signed)
NAME:  [name], [name] NO.:  [id number]  MEDICAL RECORD NO.:  [id number]  LOCATION:  [id number]                        FACILITY:  [facility]  PHYSICIAN:  Toni Arthurs, MD        DATE OF BIRTH:  [birth year]  DATE OF PROCEDURE:  06/05/2013 DATE OF DISCHARGE:                              OPERATIVE REPORT   PREOPERATIVE DIAGNOSES: 1. Right hallux osteomyelitis. 2. Left hallux neuropathic ulcer.  POSTOPERATIVE DIAGNOSES: 1. Right hallux osteomyelitis. 2. Left hallux neuropathic ulcer.  PROCEDURES: 1. Right hallux amputation through the proximal phalanx. 2. Irrigation and debridement of the left hallux deep to the fascia.  SURGEON:  Toni Arthurs, MD  ANESTHESIA:  Regional, MAC.  ESTIMATED BLOOD LOSS:  Minimal.  TOURNIQUET TIME:  19 minutes on the right with an ankle Esmarch.  COMPLICATIONS:  None apparent.  DISPOSITION:  Extubated, awake and stable to the recovery room.  INDICATIONS FOR PROCEDURE:  The patient is a 57 year old male with past medical history significant for idiopathic peripheral neuropathy.  He is also a smoker.  He has a history of bilateral hallux ulcers that have been problematic from time to time.  Most recently, the right hallux ulcer has got infected.  The patient was seen in the office.  Evaluation there revealed a deep hallux ulcer on the right that probed to the level of the bone.  On the left, the ulcer is smaller and less severe.  He presents now for operative treatment of these conditions.  He understands the risks and benefits, the alternative treatment options, and elects surgical treatment.  He specifically understands risks of bleeding, infection, nerve damage, blood clots, need for additional surgery, revision amputation, and death.  PROCEDURE IN DETAIL:  After preoperative consent was obtained, the correct operative site was identified.  The patient was brought to the operating room and placed supine on the operating table.  IV  sedation was administered.  Regional anesthesia had previously been administered. A surgical time-out was taken.  Right lower extremity was addressed first.  The foot was exsanguinated and the tourniquet was wrapped around the ankle.  The patient's ulcer was quite large and extended over the entire plantar medial aspect of the hallux distal to the midportion of the proximal phalanx.  Hallux ulcer easily probed to the level of the bone which was abnormal in texture.  Decision was made to proceed with amputation through the proximal phalanx.  The surgical incision was marked on the skin at the demarcation point between healthy skin and the ulcer.  Sharp dissection was carried down through this site and carried down to the level of bone circumferentially.  Subperiosteal dissection was carried along the proximal phalanx.  An oscillating saw was used to transect the proximal phalanx towards the base.  The toe was then passed off the field.  Wound was irrigated copiously.  All the remaining tissue appeared healthy and viable.  Neurovascular bundles were cauterized. The surgical incision was closed with horizontal mattress sutures of 2-0 nylon.  Sterile dressings were applied followed by compression wrap and the tourniquet was released at 19 minutes.  Attention was then turned to the left lower extremity.  The patient's ulcer  was identified with abundant hypertrophic callus around the periphery of the wound.  The central area was about a cm deep and about 2 cm long, but only a few mm wide.  A #10 blade was used to circumferentially debride the callus to the level of the surrounding soft tissue.  The wound was debrided of all necrotic material deep to the fascia circumferentially with a #10 blade.  The central portion of the wound was debrided with a curette and rongeur of all fibrinous exudate.  Healthy viable bleeding tissue was remaining.  The wound was then irrigated copiously.  Sterile  dressings were applied followed by compression wrap.  The patient was then awakened from anesthesia and transported to the recovery room in stable condition.  FOLLOWUP PLAN:  The patient will be observed overnight for 24 hours of IV antibiotics.  He will be weightbearing as tolerated in a postop shoe. He will follow up with me in the office in 2 weeks for suture removal and a wound check.     Toni Arthurs, MD     JH/MEDQ  D:  06/05/2013  T:  06/06/2013  Job:  [id number]

## 2013-06-06 NOTE — Discharge Summary (Signed)
Physician Discharge Summary  Patient ID: [name] MRN: [id number] DOB/AGE: 04-Jan-1956 57 y.o.  Admit date: 06/05/2013 Discharge date: 06/06/2013  Admission Diagnoses:  Peripheral neuropathy with right hallux osteomyelitis and left hallux ulcer, h/o smoking  Discharge Diagnoses:  Same s/p right hallux amputation and left hallux I and D.  Discharged Condition: stable  Hospital Course: Pt was admitted to the hospital and taken to the oR where he underwent R hallux amputation and left hallux I and D.  He tolerated these procedures well and was sent to 5N in stable condition.  Postoperatively his course was remarkable for a course of IV abx for 3 doses post op.  Consults: None  Significant Diagnostic Studies: none  Treatments: surgery: as above  Discharge Exam: Blood pressure 102/63, pulse 63, temperature 97.9 F (36.6 C), temperature source Oral, resp. rate 16, height 6\' 8"  (2.032 m), weight 124.739 kg (275 lb), SpO2 93.00%. Wn wd male in nad.  A and O x 4.  Mood anda ffect.  EOMI.  Resp unlabored.  R foot dressing with some dried blood.  L dressed andd ry.  Disposition: 01-Home or Self Care  Discharge Orders   Future Orders Complete By Expires     Call MD / Call 911  As directed     Comments:      If you experience chest pain or shortness of breath, CALL 911 and be transported to the hospital emergency room.  If you develope a fever above 101 F, pus (white drainage) or increased drainage or redness at the wound, or calf pain, call your surgeon's office.    Call MD / Call 911  As directed     Comments:      If you experience chest pain or shortness of breath, CALL 911 and be transported to the hospital emergency room.  If you develope a fever above 101 F, pus (white drainage) or increased drainage or redness at the wound, or calf pain, call your surgeon's office.    Constipation Prevention  As directed     Comments:      Drink plenty of fluids.  Prune juice may be helpful.  You  may use a stool softener, such as Colace (over the counter) 100 mg twice a day.  Use MiraLax (over the counter) for constipation as needed.    Constipation Prevention  As directed     Comments:      Drink plenty of fluids.  Prune juice may be helpful.  You may use a stool softener, such as Colace (over the counter) 100 mg twice a day.  Use MiraLax (over the counter) for constipation as needed.    Diet - low sodium heart healthy  As directed     Diet - low sodium heart healthy  As directed     Increase activity slowly as tolerated  As directed     Increase activity slowly as tolerated  As directed         Medication List    STOP taking these medications       ciprofloxacin 500 MG tablet  Commonly known as:  CIPRO     clindamycin 150 MG capsule  Commonly known as:  CLEOCIN     multivitamin with minerals Tabs      TAKE these medications       ibuprofen 200 MG tablet  Commonly known as:  ADVIL,MOTRIN  Take 400 mg by mouth every 6 (six) hours as needed for pain.  oxyCODONE-acetaminophen 5-325 MG per tablet  Commonly known as:  PERCOCET/ROXICET  Take 2 tablets by mouth every 4 (four) hours as needed for pain.     vitamin B-12 100 MCG tablet  Commonly known as:  CYANOCOBALAMIN  Take 50 mcg by mouth every morning.           Follow-up Information   Follow up with Divina Neale, Jonny Ruiz, MD. Schedule an appointment as soon as possible for a visit in 2 weeks.   Contact information:   [address] [place] [address] [phone]       Signed: Toni Arthurs 06/06/2013, 1:22 PM

## 2013-06-06 NOTE — Anesthesia Postprocedure Evaluation (Signed)
  Anesthesia Post-op Note  Patient: [name]  Procedure(s) Performed: Procedure(s): RIGHT GREAT TOE AMPUTATION/LEFT GREAT TOE DEBRIDEMENT (Bilateral)  Patient Location: PACU  Anesthesia Type:MAC and MAC combined with regional for post-op pain  Level of Consciousness: awake, alert  and oriented  Airway and Oxygen Therapy: Patient Spontanous Breathing  Post-op Pain: none  Post-op Assessment: Post-op Vital signs reviewed, Patient's Cardiovascular Status Stable, Respiratory Function Stable and Pain level controlled  Post-op Vital Signs: stable  Complications: No apparent anesthesia complications

## 2013-06-06 NOTE — Progress Notes (Signed)
Patient discharged in stable condition via wheelchair to home. Discharge instructions were given and explained.

## 2013-06-07 ENCOUNTER — Encounter (HOSPITAL_COMMUNITY): Payer: Self-pay | Admitting: Orthopedic Surgery

## 2014-02-21 ENCOUNTER — Encounter (HOSPITAL_COMMUNITY): Payer: Self-pay | Admitting: Emergency Medicine

## 2014-02-21 ENCOUNTER — Emergency Department (INDEPENDENT_AMBULATORY_CARE_PROVIDER_SITE_OTHER)
Admission: EM | Admit: 2014-02-21 | Discharge: 2014-02-21 | Disposition: A | Discharge status: Home / Self Care | Payer: Medicaid Other | Source: Home / Self Care | Attending: Family Medicine | Admitting: Family Medicine

## 2014-02-21 DIAGNOSIS — B070 Plantar wart: Secondary | ICD-10-CM

## 2014-02-21 MED ORDER — TRAMADOL HCL 50 MG PO TABS: 50.0000 mg | ORAL_TABLET | Freq: Four times a day (QID) | ORAL | Status: DC | PRN

## 2014-02-21 MED ORDER — DOXYCYCLINE HYCLATE 100 MG PO CAPS: 100.0000 mg | ORAL_CAPSULE | Freq: Two times a day (BID) | ORAL | Status: DC

## 2014-02-21 NOTE — ED Provider Notes (Signed)
[name] is a 58 y.o. male who presents to Urgent Care today for toe growth. Patient has a history of plantar wart overgrowth of his toes on his feet bilaterally. This was previously treated with periodic scraping by a podiatrist in [place]. He has been several years since his last visit. He notes that the warty growths have worsened recently and become somewhat painful. He would like a referral to podiatry if possible. The pain is moderate and worse with activity. He has difficulty walking and uses postoperative shoes to alleviate his discomfort.   Past Medical History  Diagnosis Date  . Pneumonia 2011  . Neuropathy     in both feet  . Hypertension     has previously been on medication but then taken off   History  Substance Use Topics  . Smoking status: Former Smoker -- 0.50 packs/day for 30 years    Quit date: 04/29/2013  . Smokeless tobacco: Never Used  . Alcohol Use: Yes     Comment: rarely   ROS as above Medications: No current facility-administered medications for this encounter.   Current Outpatient Prescriptions  Medication Sig Dispense Refill  . doxycycline (VIBRAMYCIN) 100 MG capsule Take 1 capsule (100 mg total) by mouth 2 (two) times daily.  14 capsule  0  . traMADol (ULTRAM) 50 MG tablet Take 1 tablet (50 mg total) by mouth every 6 (six) hours as needed.  15 tablet  0  . vitamin B-12 (CYANOCOBALAMIN) 100 MCG tablet Take 50 mcg by mouth every morning.        Exam:  BP 155/88  Pulse 94  Temp(Src) 98.5 F (36.9 C) (Oral)  Resp 18  SpO2 98% Gen: Well NAD Feet bilaterally: right great toe amputation.  Warty growth covering the medial surface of the left great toe and the entire dorsal plantar surface of the right second toe. Mild erythema and mildly tender. Capillary refill is intact in the feet bilaterally.   Assessment and Plan: 58 y.o. male with suspect plantar warts. Refer to podiatry. Doxycycline and tramadol for possible cellulitis and pain.    Discussed warning signs or symptoms. Please see discharge instructions. Patient expresses understanding.    Gregor Hams, MD 02/21/14 [phone]

## 2014-02-21 NOTE — ED Notes (Signed)
C/o bilateral foot pain  States he has ulcers on the right foot second toe and left foot big toe Patient has had right foot big toe amputated  States he has appt with family practice for PCP care and will ask for referral

## 2014-02-21 NOTE — Discharge Instructions (Signed)
Thank you for coming in today. Take doxycyline twice daily for infection.  Follow up as directed.  Call [facility] if you need to change your appointment.  Use tramadol for pain.  Follow up with the [facility] ASAP.   Plantar Wart Warts are benign (noncancerous) growths of the outer skin layer. They can occur at any time in life but are most common during childhood and the teen years. Warts can occur on many skin surfaces of the body. When they occur on the underside (sole) of your foot they are called plantar warts. They often emerge in groups with several small warts encircling a larger growth. CAUSES  Human papillomavirus (HPV) is the cause of plantar warts. HPV attacks a break in the skin of the foot. Walking barefoot can lead to exposure to the wart virus. Plantar warts tend to develop over areas of pressure such as the heel and ball of the foot. Plantar warts often grow into the deeper layers of skin. They may spread to other areas of the sole but cannot spread to other areas of the body. SYMPTOMS  You may also notice a growth on the undersurface of your foot. The wart may grow directly into the sole of the foot, or rise above the surface of the skin on the sole of the foot, or both. They are most often flat from pressure. Warts generally do not cause itching but may cause pain in the area of the wart when you put weight on your foot. DIAGNOSIS  Diagnosis is made by physical examination. This means your caregiver discovers it while examining your foot.  TREATMENT  There are many ways to treat plantar warts. However, warts are very tough. Sometimes it is difficult to treat them so that they go away completely and do not grow back. Any treatment must be done regularly to work. If left untreated, most plantar warts will eventually disappear over a period of one to two years. Treatments you can do at home include:  Putting duct tape over the top of the wart (occlusion), has  been found to be effective over several months. The duct tape should be removed each night and reapplied until the wart has disappeared.  Placing over-the-counter medications on top of the wart to help kill the wart virus and remove the wart tissue (salicylic acid, cantharidin, and dichloroacetic acid ) are useful. These are called keratolytic agents. These medications make the skin soft and gradually layers will shed away. Theses compounds are usually placed on the wart each night and then covered with a band-aid. They are also available in pre-medicated band-aid form. Avoid surrounding skin when applying these liquids as these medications can burn healthy skin. The treatment may take several months of nightly use to be effective.  Cryotherapy to freeze the wart has recently become available over-the-counter for children 4 years and older. This system makes use of a soft narrow applicator connected to a bottle of compressed cold liquid that is applied directly to the wart. This medication can burn health skin and should be used with caution.  As with all over-the-counter medications, read the directions carefully before use. Treatments generally done in your caregiver's office include:  Some aggressive treatments may cause discomfort, discoloration and scaring of the surrounding skin. The risks and benefits of treatment should be discussed with your caregiver.  Freezing the wart with liquid nitrogen (cryotherapy, see above).  Burning the wart with use of very high heat (cautery).  Injecting medication  into the wart.  Surgically removing or laser treatment of the wart.  Your caregiver may refer you to a dermatologist for difficult to treat, large sized or large numbers of warts. HOME CARE INSTRUCTIONS   Soak the affected area in warm water. Dry the area completely when you are done. Remove the top layer of softened skin, then apply the chosen topical medication and reapply a bandage.  Remove  the bandage daily and file excess wart tissue (pumice stone works well for this purpose). Repeat the entire process daily or every other day for weeks until the plantar wart disappears.  Several brands of salicylic acid pads are available as over-the-counter remedies.  Pain can be relieved by wearing a doughnut bandage. This is a bandage with a hole in it. The bandage is put on with the hole over the wart. This helps take the pressure off the wart and gives pain relief. To help prevent plantar warts:  Wear shoes and socks and change them daily.  Keep feet clean and dry.  Check your feet and your children's feet regularly.  Avoid direct contact with warts on other people.  Have growths, or changes on your skin checked by your caregiver. Document Released: 02/05/2004 Document Revised: 02/07/2012 Document Reviewed: 07/16/2009 [facility] Patient Information 2014 [employer].

## 2014-03-07 ENCOUNTER — Encounter: Payer: Self-pay | Admitting: Podiatry

## 2014-03-07 ENCOUNTER — Emergency Department (HOSPITAL_COMMUNITY)
Admission: EM | Admit: 2014-03-07 | Discharge: 2014-03-07 | Disposition: A | Discharge status: Home / Self Care | Payer: Medicaid Other | Attending: Emergency Medicine | Admitting: Emergency Medicine

## 2014-03-07 ENCOUNTER — Ambulatory Visit (INDEPENDENT_AMBULATORY_CARE_PROVIDER_SITE_OTHER): Payer: Medicaid Other

## 2014-03-07 ENCOUNTER — Encounter (HOSPITAL_COMMUNITY): Payer: Self-pay | Admitting: Emergency Medicine

## 2014-03-07 ENCOUNTER — Ambulatory Visit (INDEPENDENT_AMBULATORY_CARE_PROVIDER_SITE_OTHER): Payer: Medicaid Other | Admitting: Podiatry

## 2014-03-07 VITALS — BP 161/97 | HR 82 | Resp 18 | Ht 79.5 in | Wt 284.0 lb

## 2014-03-07 DIAGNOSIS — B351 Tinea unguium: Secondary | ICD-10-CM

## 2014-03-07 DIAGNOSIS — M79672 Pain in left foot: Secondary | ICD-10-CM

## 2014-03-07 DIAGNOSIS — F17200 Nicotine dependence, unspecified, uncomplicated: Secondary | ICD-10-CM | POA: Insufficient documentation

## 2014-03-07 DIAGNOSIS — M79671 Pain in right foot: Secondary | ICD-10-CM

## 2014-03-07 DIAGNOSIS — Z792 Long term (current) use of antibiotics: Secondary | ICD-10-CM | POA: Insufficient documentation

## 2014-03-07 DIAGNOSIS — Z8619 Personal history of other infectious and parasitic diseases: Secondary | ICD-10-CM | POA: Insufficient documentation

## 2014-03-07 DIAGNOSIS — M79609 Pain in unspecified limb: Secondary | ICD-10-CM

## 2014-03-07 DIAGNOSIS — L97509 Non-pressure chronic ulcer of other part of unspecified foot with unspecified severity: Secondary | ICD-10-CM

## 2014-03-07 DIAGNOSIS — M25579 Pain in unspecified ankle and joints of unspecified foot: Secondary | ICD-10-CM | POA: Insufficient documentation

## 2014-03-07 DIAGNOSIS — G8929 Other chronic pain: Secondary | ICD-10-CM | POA: Insufficient documentation

## 2014-03-07 DIAGNOSIS — Z8701 Personal history of pneumonia (recurrent): Secondary | ICD-10-CM | POA: Insufficient documentation

## 2014-03-07 DIAGNOSIS — M86179 Other acute osteomyelitis, unspecified ankle and foot: Secondary | ICD-10-CM

## 2014-03-07 DIAGNOSIS — Z8669 Personal history of other diseases of the nervous system and sense organs: Secondary | ICD-10-CM | POA: Insufficient documentation

## 2014-03-07 DIAGNOSIS — I10 Essential (primary) hypertension: Secondary | ICD-10-CM | POA: Insufficient documentation

## 2014-03-07 DIAGNOSIS — Q828 Other specified congenital malformations of skin: Secondary | ICD-10-CM

## 2014-03-07 MED ORDER — OXYCODONE-ACETAMINOPHEN 5-325 MG PO TABS: 1.0000 | ORAL_TABLET | Freq: Four times a day (QID) | ORAL | Status: DC | PRN

## 2014-03-07 MED ORDER — OXYCODONE-ACETAMINOPHEN 5-325 MG PO TABS
1.0000 | ORAL_TABLET | Freq: Once | ORAL | Status: AC
  Administered 2014-03-07: 1 via ORAL
  Filled 2014-03-07: qty 1

## 2014-03-07 NOTE — ED Provider Notes (Signed)
CSN: [id number]     Arrival date & time 03/07/14  1427 History  This chart was scribed for non-physician practitioner working with Blanchard Kelch, MD by Stacy Gardner, ED scribe. This patient was seen in room [facility] and the patient's care was started at 2:37 PM.   First MD Initiated Contact with Patient 03/07/14 1432     No chief complaint on file.    (Consider location/radiation/quality/duration/timing/severity/associated sxs/prior Treatment) Patient is a 58 y.o. male presenting with lower extremity pain. The history is provided by the patient and medical records. No language interpreter was used.  Foot Pain   HPI Comments: [name] is a 58 y.o. male who presents to the Emergency Department complaining of chronic bilateral foot pain that is getting worse. The pain onset five years ago.  Pt visited Urgent Care today for a growth to his toe. He has a hx of plantar warts and complains the warts are more painful. Pt is unable to stand due to pain to his feet. He states the pain is "killing him". He was told to come to the ED if the pain gets worse. Pt took Tramadol which did not make the pain better.  Pt had a x-ray performed today that showed signs of infection. Nothing seems to his symptoms better. 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 He denies DM. Pt states they are unsure of the dx of his problem but he speculates that it is due to being bitten by a brown recluse spider. He does not have a current PCP. He would like to be seen by Pain Management and requests a referral to a Podiatrist. He was sent by the ED to [facility] and had amputation. Past Medical History  Diagnosis Date  . Pneumonia 2011  . Neuropathy     in both feet  . Hypertension     has previously been on medication but then taken off   Past Surgical History  Procedure Laterality Date  . Tooth extraction  2011    multiple teeth extracted  . Toe amputation Right 06/05/2013     RIGHT GREAT TOE PARTIAL AMPUTATION    . Amputation Bilateral 06/05/2013    Procedure: RIGHT GREAT TOE AMPUTATION/LEFT GREAT TOE DEBRIDEMENT;  Surgeon: Wylene Simmer, MD;  Location: [facility];  Service: Orthopedics;  Laterality: Bilateral;   Family History  Problem Relation Age of Onset  . Heart disease Mother   . Cancer Father   . Diabetes Father   . Cancer Sister    History  Substance Use Topics  . Smoking status: Current Some Day Smoker -- 0.50 packs/day for 30 years    Last Attempt to Quit: 04/29/2013  . Smokeless tobacco: Never Used     Comment: occasional smoker   . Alcohol Use: Yes     Comment: rarely    Review of Systems  Musculoskeletal: Positive for arthralgias, gait problem and myalgias.  All other systems reviewed and are negative.     Allergies  Review of patient's allergies indicates no known allergies.  Home Medications   Current Outpatient Rx  Name  Route  Sig  Dispense  Refill  . doxycycline (VIBRAMYCIN) 100 MG capsule   Oral   Take 1 capsule (100 mg total) by mouth 2 (two) times daily.   14 capsule   0   . traMADol (ULTRAM) 50 MG tablet   Oral   Take 1 tablet (50 mg total) by mouth every 6 (six)  hours as needed.   15 tablet   0   . vitamin B-12 (CYANOCOBALAMIN) 100 MCG tablet   Oral   Take 50 mcg by mouth every morning.          BP 138/90  Pulse 86  Temp(Src) 98.5 F (36.9 C) (Oral)  Resp 18  SpO2 97% Physical Exam  Nursing note and vitals reviewed. Constitutional: He is oriented to person, place, and time. He appears well-developed and well-nourished. No distress.  HENT:  Head: Normocephalic and atraumatic.  Eyes: Pupils are equal, round, and reactive to light.  Neck: Normal range of motion. Neck supple.  Pulmonary/Chest: Effort normal.  Musculoskeletal:       Right foot: He exhibits tenderness. He exhibits no bony tenderness, no swelling and normal capillary refill.       Left foot: He exhibits tenderness. He exhibits no swelling  and normal capillary refill.  Neurological: He is alert and oriented to person, place, and time.  Skin: Skin is warm and dry. No erythema.    ED Course  Procedures (including critical care time) DIAGNOSTIC STUDIES: Oxygen Saturation is 97% on room air, normal by my interpretation.    COORDINATION OF CARE:  2:42 PM Discussed course of care with pt which includes medication. Pt understands and agrees. I advised the patient will need follow with primary care Dr. for further evaluation and care.  The patient has had long-standing pain with his feet.  He was actually seen today by [facility] for continued care of his feet.  He states that he is having pain, and they couldn't give him any medications.  He was referred here for pain control.  Patient does not have any signs of infection at this time.  He had x-rays done earlier today on his feet.  Patient would like referral to pain management clinic  I personally performed the services described in this documentation, which was scribed in my presence. The recorded information has been reviewed and is accurate.   Brent General, PA-C 03/07/14 1459

## 2014-03-07 NOTE — ED Notes (Signed)
Pt reports persistent bilateral foot pain. Referred by [facility]. Requesting pain medication. PA at bedside

## 2014-03-07 NOTE — ED Provider Notes (Signed)
Medical screening examination/treatment/procedure(s) were performed by non-physician practitioner and as supervising physician I was immediately available for consultation/collaboration.   EKG Interpretation None        Blanchard Kelch, MD 03/07/14 1501

## 2014-03-07 NOTE — Progress Notes (Signed)
   Subjective:    Patient ID: [name], male    DOB: 11/19/1956, 58 y.o.   MRN: [id number]  HPI Comments: Having a problem with my toes, its been going on since 2005. Right #2 toe callused, and the left great toe callused lesions. Lost the right great toe July 2014 due to infection that got into the bone. Been soaking in epsom salt , neosporin , ped egg.  Have neuropothy in both feet.   Toe Pain       Review of Systems  HENT:       Sinuses   All other systems reviewed and are negative.      Objective:   Physical Exam: I have reviewed his past history medications allergies surgeries social history and review of systems. Pulses are palpable bilateral lower extremity neurologic sensorium is decreased. Deep tendon reflexes are in non-elicitable muscle strength is normal dorsiflexors plantar flexors inverters everters all intrinsic musculature is intact orthopedic evaluation demonstrates amputation hallux right previous osteomyelitis. Hammertoe deformities 2 through 4 of the right foot and one through 4 of the left foot. Hypertrophic hallux left secondary to chronic irritation and ulceration. Cutaneous evaluation demonstrates supple well hydrated cutis he has nail dystrophy and distal clavus to the second digit of the right foot with rigid hammertoe deformity. Hallux left demonstrates severe hypertrophic skin to the plantar and plantar medial aspect of the hallux left. Upon debridement I'm able to visualize and ulceration the does not appear to be clinically infected at this time. Nails are thick yellow dystrophic and clinically mycotic.        Assessment & Plan:  Assessment: Ulceration hallux left. Pain in limb secondary to onychomycosis.  Plan: Debridement all reactive hyperkeratosis. Discussed the need for possible amputation hallux left. And debrided all mycotic nails. Discussed conservative therapies as far as soaking in antibiotic therapy for the hallux left. I will followup with  him in 2 weeks

## 2014-03-07 NOTE — Discharge Instructions (Signed)
Followup with your primary care Dr. as scheduled.  Return here as needed.  There is a pain management clinic provided in this paper work

## 2014-03-12 ENCOUNTER — Encounter: Payer: Self-pay | Admitting: Family Medicine

## 2014-03-12 ENCOUNTER — Ambulatory Visit (INDEPENDENT_AMBULATORY_CARE_PROVIDER_SITE_OTHER): Payer: Medicaid Other | Admitting: Family Medicine

## 2014-03-12 ENCOUNTER — Ambulatory Visit: Payer: Medicaid Other | Admitting: Family Medicine

## 2014-03-12 VITALS — BP 149/92 | HR 74 | Temp 98.4°F | Ht 79.5 in | Wt 301.7 lb

## 2014-03-12 DIAGNOSIS — G589 Mononeuropathy, unspecified: Secondary | ICD-10-CM

## 2014-03-12 DIAGNOSIS — G629 Polyneuropathy, unspecified: Secondary | ICD-10-CM

## 2014-03-12 DIAGNOSIS — I10 Essential (primary) hypertension: Secondary | ICD-10-CM

## 2014-03-12 DIAGNOSIS — G609 Hereditary and idiopathic neuropathy, unspecified: Secondary | ICD-10-CM

## 2014-03-12 DIAGNOSIS — D179 Benign lipomatous neoplasm, unspecified: Secondary | ICD-10-CM

## 2014-03-12 LAB — COMPREHENSIVE METABOLIC PANEL
ALT: 21 U/L (ref 0–53)
AST: 38 U/L — ABNORMAL HIGH (ref 0–37)
Albumin: 4 g/dL (ref 3.5–5.2)
Alkaline Phosphatase: 101 U/L (ref 39–117)
BUN: 11 mg/dL (ref 6–23)
CO2: 29 mEq/L (ref 19–32)
Calcium: 9.2 mg/dL (ref 8.4–10.5)
Chloride: 101 mEq/L (ref 96–112)
Creat: 1.01 mg/dL (ref 0.50–1.35)
Glucose, Bld: 81 mg/dL (ref 70–99)
Potassium: 4.1 mEq/L (ref 3.5–5.3)
Sodium: 139 mEq/L (ref 135–145)
Total Bilirubin: 0.4 mg/dL (ref 0.2–1.2)
Total Protein: 7.5 g/dL (ref 6.0–8.3)

## 2014-03-12 LAB — LIPID PANEL
Cholesterol: 181 mg/dL (ref 0–200)
HDL: 55 mg/dL (ref >39)
LDL Cholesterol: 115 mg/dL — ABNORMAL HIGH (ref 0–99)
Total CHOL/HDL Ratio: 3.3 Ratio
Triglycerides: 55 mg/dL (ref <150)
VLDL: 11 mg/dL (ref 0–40)

## 2014-03-12 LAB — POCT GLYCOSYLATED HEMOGLOBIN (HGB A1C): Hemoglobin A1C: 5.3

## 2014-03-12 NOTE — Patient Instructions (Signed)
Thank you for coming in,   Please make an appointment with Dr. Valentina Lucks to have your ankle/brachial index measured. This will determine if you have good blood flow to your feet.   We will labs drawn today and we will call you with the results.   Please start taking an 81 mg aspirin daily.   Follow up with me after you have the appointment with Dr. Valentina Lucks.    Please feel free to call with any questions or concerns at any time, at [phone]. --Dr. Raeford Razor

## 2014-03-13 ENCOUNTER — Encounter: Payer: Self-pay | Admitting: Family Medicine

## 2014-03-13 ENCOUNTER — Ambulatory Visit: Payer: Medicaid Other | Admitting: Podiatry

## 2014-03-13 DIAGNOSIS — D179 Benign lipomatous neoplasm, unspecified: Secondary | ICD-10-CM | POA: Insufficient documentation

## 2014-03-13 LAB — TSH: TSH: 1.686 u[IU]/mL (ref 0.350–4.500)

## 2014-03-13 NOTE — Assessment & Plan Note (Addendum)
Patient's mildly elevated. He was previously treated but not currently. Will hold off on starting medications until multiple blood pressures can be taken.  - lipid panel  - Cmp  - TSH: normal  - will have him follow up in one week

## 2014-03-13 NOTE — Progress Notes (Signed)
   Subjective:    Patient ID: [name], male    DOB: 1956/11/04, 58 y.o.   MRN: [id number]  HPI [name] is here to establish care.  Patient has a history of foot ulcers. He started having pain in his feet in 2004.  He was bite by a brown recluse spider and has had pain since then.  He recently moved here from [place] where he was being followed. His doesn't remember his previous providers name. He has had multiple ED visits and is established with an podiatrist and orthopedist [facility]). He has his right hallux amputated due to osteomyelitis. He has numbness and pain in his feet. He has pain and numbness to his mid tibia bilaterally. His pain is worse upon walking. His pain is improved when he rests. He is scheduled to follow up with his podiatrist for May 4.   HTN: he has previously been treated with anti-hypertensive medications in the past. He is not currently treated. The medication was stopped because he was having a reaction.  He currently doesn't monitor he blood pressure.  He denies any chest pain. He has had shortness of breath, swelling, lightheadedness upon standing. His blood pressure usually runs in the 140/90's.   He would like to be tested for COPD.  He was told that he has COPD.  He is a regular smoker. He smoked 1 1/2 - 2 PPD for 20 years. He currently smokes 2-4 packs per week. He denies any sputum production.   Patient has a lump is in left upper chest.  It has been there for roughly a year. There is no skin changes about it. It has not gotten any bigger and is not painful.   Current Outpatient Prescriptions on File Prior to Visit  Medication Sig Dispense Refill  . oxyCODONE-acetaminophen (PERCOCET/ROXICET) 5-325 MG per tablet Take 1 tablet by mouth every 6 (six) hours as needed for severe pain.  15 tablet  0  . traMADol (ULTRAM) 50 MG tablet Take 1 tablet (50 mg total) by mouth every 6 (six) hours as needed.  15 tablet  0  . vitamin B-12 (CYANOCOBALAMIN) 100 MCG  tablet Take 50 mcg by mouth every morning.       No current facility-administered medications on file prior to visit.    SHx: patient smokes 2-4 packs per week. He only drinks alcohol occasionally   Health Maintenance: he has never had a colonoscopy. His father passed from pancreatic cancer and his sister passed away from breast cancer.   Review of Systems See HPI     Objective:   Physical Exam BP 149/92  Pulse 74  Temp(Src) 98.4 F (36.9 C) (Oral)  Ht 6' 7.5" (2.019 m)  Wt 301 lb 11.2 oz (136.85 kg)  BMI 33.57 kg/m2 Gen: NAD, alert, cooperative with exam, well-appearing, obese, Caucasian male HEENT: NCAT, PERRL, clear conjunctiva, supple neck CV: RRR, good S1/S2, no murmur, no edema Resp: CTABL, no wheezes, non-labored Abd: SNTND, BS present, no guarding or organomegaly Skin: no rashes, normal turgor, 4 x 4 cm circular, soft,   Neuro: no gross deficits.  Psych: good insight, alert and oriented MSK: Moves all extremities freely, Right hallux is amputated, +1 TP b/l, cold lower extremities       Assessment & Plan:

## 2014-03-13 NOTE — Assessment & Plan Note (Addendum)
Patient not taking any medications. He states that he has had previous EMG's. Unsure of what his pain is caused from. Lower extremities are cool and +1 PT pulses.  - will refer to Dr. Valentina Lucks for ABI's  - Hgb A1c is normal  - may need to start gabapentin if ABI's are no indicative of PAD.

## 2014-03-13 NOTE — Assessment & Plan Note (Signed)
4 x 4 cm circular soft mass on his right chest. No overlying skin changes.  - will continue to monitor - discussed with Dr. Gwendlyn Deutscher.

## 2014-03-15 ENCOUNTER — Ambulatory Visit (INDEPENDENT_AMBULATORY_CARE_PROVIDER_SITE_OTHER): Payer: Medicaid Other | Admitting: Pharmacist

## 2014-03-15 ENCOUNTER — Encounter: Payer: Self-pay | Admitting: Pharmacist

## 2014-03-15 VITALS — BP 136/94 | HR 77 | Ht 79.5 in | Wt 305.9 lb

## 2014-03-15 DIAGNOSIS — F17200 Nicotine dependence, unspecified, uncomplicated: Secondary | ICD-10-CM

## 2014-03-15 DIAGNOSIS — G629 Polyneuropathy, unspecified: Secondary | ICD-10-CM

## 2014-03-15 DIAGNOSIS — G589 Mononeuropathy, unspecified: Secondary | ICD-10-CM

## 2014-03-15 MED ORDER — VARENICLINE TARTRATE 0.5 MG X 11 & 1 MG X 42 PO MISC: ORAL | Status: DC

## 2014-03-15 NOTE — Patient Instructions (Addendum)
Your results today showed normal blood flow in your legs and feet. We have sent a prescription for Chantix to your pharmacy. Please come back for your appointment with Dr. Raeford Razor next Friday, and come back to see Dr. Valentina Lucks in about a month.

## 2014-03-15 NOTE — Progress Notes (Signed)
Patient ID: [name], male   DOB: May 01, 1956, 58 y.o.   MRN: [id number] Reviewed: Agree with Dr. Graylin Shiver documentation and management.

## 2014-03-15 NOTE — Assessment & Plan Note (Signed)
History of moderate Nicotine Dependence of 40 years duration in a patient who is good candidate for success b/c of motivation, previous quit duration of 1 week with bupropion, and decrease from 1.5-2 ppd to less than 1 pack per week (4 cigs per day).  Initiated varenicline tx starting month pack. Patient counseled on purpose, proper use, and potential adverse effects, including GI upset, and potential change in mood. F/U Rx Clinic Visit in one month for Chantix evaluation and new prescription. Total time in face-to-face counseling 45 minutes.  Patient seen with Toribio Harbour, PharmD Candidate,  Silas Sacramento,  PharmD Candidate.

## 2014-03-15 NOTE — Progress Notes (Signed)
S:    Patient arrives in good spirits.  She presents to the clinic for PADABI evaluation and assistance with tobacco cessation. Reports intermittent pain with walking.  Pain is described as a mix of numbness, tingling, and stabbing pain.  Reports pain starting while at rest or standing still. Pain is localized to the lower legs, mostly in his feet.  Age when started using tobacco on a daily basis ~21 (reports about 40 years of smoking history, didn't start smoking daily until a few years later). Number of Cigarettes per day 4-5. Longest time ever been tobacco free 1 week. What Medications (NRT, bupropion, varenicline) used in past includes bupropion.  Rates CONFIDENCE of quitting tobacco on 1-10 scale of 10. Triggers to use tobacco include irritability after not having a cigarette in a while.   O:  Lower extremity Physical Exam includes . Diminished pulses in the right foot, thickened brittle nails bilaterally, current scabbing on the left big toe. Right big toe amputation site from 05/2013 healed.   ABI overall = 1.01 Right Arm 148 mmHg    Left Arm 148 mmHg Right ankle posterior tibial 152 mmHg     dorsalis pedis 144 mmHg Left ankle posterior tibial 132 mmHg    dorsalis pedis 150 mmHg   A/P: Normal ABI and low likelihood of PAD based on ABI of 1.01 in a patient with symptoms of numbness and tingling in his feet. Results reviewed and written information provided.   F/U Clinic Visit with Dr. Raeford Razor next Friday.  History of moderate Nicotine Dependence of 40 years duration in a patient who is good candidate for success b/c of motivation, previous quit duration of 1 week with bupropion, and decrease from 1.5-2 ppd to less than 1 pack per week (4 cigs per day).  Initiated varenicline tx starting month pack. Patient counseled on purpose, proper use, and potential adverse effects, including GI upset, and potential change in mood. F/U Rx Clinic Visit in one month for Chantix evaluation and new  prescription. Total time in face-to-face counseling 45 minutes.  Patient seen with Toribio Harbour, PharmD Candidate,  Silas Sacramento,  PharmD Candidate.

## 2014-03-15 NOTE — Assessment & Plan Note (Signed)
Normal ABI and low likelihood of PAD based on ABI of 1.01 in a patient with symptoms of numbness and tingling in his feet. Results reviewed and written information provided.   F/U Clinic Visit with Dr. Raeford Razor next Friday.

## 2014-03-22 ENCOUNTER — Ambulatory Visit: Payer: Medicaid Other | Admitting: Family Medicine

## 2014-04-04 ENCOUNTER — Ambulatory Visit: Payer: Medicaid Other | Admitting: Podiatry

## 2014-04-09 ENCOUNTER — Encounter: Payer: Self-pay | Admitting: Podiatry

## 2014-04-09 ENCOUNTER — Ambulatory Visit (INDEPENDENT_AMBULATORY_CARE_PROVIDER_SITE_OTHER): Payer: Medicaid Other | Admitting: Podiatry

## 2014-04-09 VITALS — BP 114/84 | HR 77 | Resp 12

## 2014-04-09 DIAGNOSIS — L97509 Non-pressure chronic ulcer of other part of unspecified foot with unspecified severity: Secondary | ICD-10-CM

## 2014-04-09 NOTE — Progress Notes (Signed)
Ulceration to the left hallux appears to be healing quite nicely is still sore his as.  Objective: Vital signs are stable he is alert and oriented x3. It no longer demonstrate erythema and edema and most of the hyperkeratotic lesion is of normal thickness. He still marginal laceration are split in the skin and [place] heel yet but it does not demonstrate any signs of infection at this point.  Assessment: Superficial ulceration with 40 type tissue and hyperkeratosis of the left hallux.  Plan: Discussed etiology pathology conservative versus surgical therapies. If this is not going to heal in the next few weeks then we will consider biopsy at that time. Currently his to continue all conservative therapies I imagine that he continues to do this he will be healed by next visit.

## 2014-04-23 ENCOUNTER — Telehealth: Payer: Self-pay | Admitting: *Deleted

## 2014-04-23 NOTE — Telephone Encounter (Signed)
LVM for patient to call back. ?

## 2014-04-23 NOTE — Telephone Encounter (Signed)
Message copied by Johny Shears on Tue Apr 23, 2014 10:29 AM ------      Message from: Rosemarie Ax      Created: Tue Apr 23, 2014 10:08 AM       Please call patient and let [name] know that he should schedule a f/u to talk about his elevated lipid panel. Thank you. ------

## 2014-05-21 ENCOUNTER — Ambulatory Visit: Payer: Medicaid Other | Admitting: Podiatry

## 2014-05-28 ENCOUNTER — Ambulatory Visit (INDEPENDENT_AMBULATORY_CARE_PROVIDER_SITE_OTHER): Payer: Medicaid Other | Admitting: Podiatry

## 2014-05-28 ENCOUNTER — Encounter: Payer: Self-pay | Admitting: Podiatry

## 2014-05-28 DIAGNOSIS — M778 Other enthesopathies, not elsewhere classified: Secondary | ICD-10-CM

## 2014-05-28 DIAGNOSIS — M779 Enthesopathy, unspecified: Secondary | ICD-10-CM

## 2014-05-28 DIAGNOSIS — M7660 Achilles tendinitis, unspecified leg: Secondary | ICD-10-CM

## 2014-05-28 DIAGNOSIS — L97521 Non-pressure chronic ulcer of other part of left foot limited to breakdown of skin: Secondary | ICD-10-CM

## 2014-05-28 DIAGNOSIS — L97509 Non-pressure chronic ulcer of other part of unspecified foot with unspecified severity: Secondary | ICD-10-CM

## 2014-05-28 NOTE — Progress Notes (Signed)
He presents today for followup of ulceration to the plantar medial aspect of the hallux left this appears to be healing much better he says. He's also having pain to the first metatarsophalangeal joint the radiates up into his foot.  Objective: Vital signs are stable he is alert and oriented x3 pulses are palpable left foot. He has pain on palpation first metatarsophalangeal joint with pain on in range of motion of the joint. The reactive hyperkeratotic lesion which appears to be a warty ulcerative lesion to the plantar medial aspect of the hallux does demonstrate well-healing at this point.  Assessment: Well-healing ulceration first metatarsophalangeal joint of the left foot. Capsulitis with neuropathy first metatarsophalangeal joint of the left foot.  Plan: Debridement all reactive hyperkeratosis today placed padding and dressing. Injected to the first metatarsophalangeal joint Kenalog and local anesthetic.

## 2014-07-09 ENCOUNTER — Ambulatory Visit: Payer: Medicaid Other | Admitting: Podiatry

## 2014-07-16 ENCOUNTER — Ambulatory Visit: Payer: Medicaid Other | Admitting: Podiatry

## 2015-01-31 ENCOUNTER — Encounter (HOSPITAL_COMMUNITY): Payer: Self-pay

## 2015-01-31 ENCOUNTER — Emergency Department (HOSPITAL_COMMUNITY)
Admission: EM | Admit: 2015-01-31 | Discharge: 2015-01-31 | Disposition: A | Discharge status: Home / Self Care | Payer: Medicare Other | Attending: Emergency Medicine | Admitting: Emergency Medicine

## 2015-01-31 ENCOUNTER — Emergency Department (HOSPITAL_COMMUNITY): Payer: Medicare Other

## 2015-01-31 DIAGNOSIS — Z8701 Personal history of pneumonia (recurrent): Secondary | ICD-10-CM | POA: Diagnosis not present

## 2015-01-31 DIAGNOSIS — Z720 Tobacco use: Secondary | ICD-10-CM | POA: Insufficient documentation

## 2015-01-31 DIAGNOSIS — G629 Polyneuropathy, unspecified: Secondary | ICD-10-CM | POA: Diagnosis not present

## 2015-01-31 DIAGNOSIS — Z79899 Other long term (current) drug therapy: Secondary | ICD-10-CM | POA: Insufficient documentation

## 2015-01-31 DIAGNOSIS — I10 Essential (primary) hypertension: Secondary | ICD-10-CM | POA: Insufficient documentation

## 2015-01-31 DIAGNOSIS — Z8669 Personal history of other diseases of the nervous system and sense organs: Secondary | ICD-10-CM | POA: Diagnosis not present

## 2015-01-31 DIAGNOSIS — M79672 Pain in left foot: Secondary | ICD-10-CM | POA: Diagnosis present

## 2015-01-31 DIAGNOSIS — L97529 Non-pressure chronic ulcer of other part of left foot with unspecified severity: Secondary | ICD-10-CM

## 2015-01-31 LAB — CBC WITH DIFFERENTIAL/PLATELET
Basophils Absolute: 0 10*3/uL (ref 0.0–0.1)
Basophils Relative: 1 % (ref 0–1)
Eosinophils Absolute: 0.3 10*3/uL (ref 0.0–0.7)
Eosinophils Relative: 5 % (ref 0–5)
HCT: 45.6 % (ref 39.0–52.0)
Hemoglobin: 15.4 g/dL (ref 13.0–17.0)
Lymphocytes Relative: 44 % (ref 12–46)
Lymphs Abs: 2.8 10*3/uL (ref 0.7–4.0)
MCH: 32.8 pg (ref 26.0–34.0)
MCHC: 33.8 g/dL (ref 30.0–36.0)
MCV: 97 fL (ref 78.0–100.0)
Monocytes Absolute: 0.6 10*3/uL (ref 0.1–1.0)
Monocytes Relative: 9 % (ref 3–12)
Neutro Abs: 2.7 10*3/uL (ref 1.7–7.7)
Neutrophils Relative %: 43 % (ref 43–77)
Platelets: 247 10*3/uL (ref 150–400)
RBC: 4.7 MIL/uL (ref 4.22–5.81)
RDW: 13.8 % (ref 11.5–15.5)
WBC: 6.4 10*3/uL (ref 4.0–10.5)

## 2015-01-31 LAB — BASIC METABOLIC PANEL
Anion gap: 6 (ref 5–15)
BUN: 17 mg/dL (ref 6–23)
CO2: 22 mmol/L (ref 19–32)
Calcium: 8.8 mg/dL (ref 8.4–10.5)
Chloride: 110 mmol/L (ref 96–112)
Creatinine, Ser: 0.89 mg/dL (ref 0.50–1.35)
GFR calc Af Amer: >90 mL/min (ref >90)
GFR calc non Af Amer: >90 mL/min (ref >90)
Glucose, Bld: 109 mg/dL — ABNORMAL HIGH (ref 70–99)
Potassium: 4.2 mmol/L (ref 3.5–5.1)
Sodium: 138 mmol/L (ref 135–145)

## 2015-01-31 LAB — C-REACTIVE PROTEIN: CRP: 0.9 mg/dL — ABNORMAL HIGH (ref <0.60)

## 2015-01-31 LAB — SEDIMENTATION RATE: Sed Rate: 23 mm/hr — ABNORMAL HIGH (ref 0–16)

## 2015-01-31 MED ORDER — OXYCODONE-ACETAMINOPHEN 5-325 MG PO TABS: 1.0000 | ORAL_TABLET | ORAL | Status: DC | PRN

## 2015-01-31 NOTE — ED Provider Notes (Signed)
CSN: [id number]     Arrival date & time 01/31/15  1006 History   First MD Initiated Contact with Patient 01/31/15 1012     Chief Complaint  Patient presents with  . Foot Pain   [name] is a 59 y.o. male with a history of foot neuropathy, hypertension, right great toe amputation, and left great toe ulcer who presents to the ED complaining of worsening pain in his left great toe. Patient reports he has had an ulcer on his left great toe for more than 10 years and this is worsened in the past 2 weeks. The patient reports he is previously seen podiatry for similar issues and has not been able to recently. Patient wants he lost his right great toe due to a similar ulceration lead to osteomyelitis. Patient currently rates his pain a 10 out of 10 in his left great toe. He reports having intermittent discharge from his left great toe. The patient has taken Tylenol and soaked in Epsom salts without relief. Patient has not been able to see podiatry recently. The patient denies fevers, or recent illness. He denies trauma or injury to his toe.   (Consider location/radiation/quality/duration/timing/severity/associated sxs/prior Treatment) HPI  Past Medical History  Diagnosis Date  . Pneumonia 2011  . Neuropathy     in both feet  . Hypertension     has previously been on medication but then taken off   Past Surgical History  Procedure Laterality Date  . Tooth extraction  2011    multiple teeth extracted  . Toe amputation Right 06/05/2013    RIGHT GREAT TOE PARTIAL AMPUTATION    . Amputation Bilateral 06/05/2013    Procedure: RIGHT GREAT TOE AMPUTATION/LEFT GREAT TOE DEBRIDEMENT;  Surgeon: Wylene Simmer, MD;  Location: [facility];  Service: Orthopedics;  Laterality: Bilateral;   Family History  Problem Relation Age of Onset  . Heart disease Mother   . Cancer Father   . Diabetes Father   . Cancer Sister    History  Substance Use Topics  . Smoking status: Current Some Day Smoker -- 0.20 packs/day for  40 years    Types: Cigarettes    Start date: 11/29/1973  . Smokeless tobacco: Never Used     Comment: occasional smoker - ~1 pack/week. smoked ~1.5ppd until 5 yr ago  . Alcohol Use: No    Review of Systems  Constitutional: Negative for fever.  Musculoskeletal:       Left great toe pain   Skin: Positive for rash and wound.  Neurological: Positive for numbness (left great toe numbness).      Allergies  Review of patient's allergies indicates no known allergies.  Home Medications   Prior to Admission medications   Medication Sig Start Date End Date Taking? Authorizing Provider  acetaminophen (TYLENOL) 500 MG tablet Take 1,500 mg by mouth every 6 (six) hours as needed for moderate pain.   Yes Historical Provider, MD  nicotine (NICODERM CQ - DOSED IN MG/24 HOURS) 21 mg/24hr patch Place 21 mg onto the skin daily.   Yes Historical Provider, MD  oxyCODONE-acetaminophen (PERCOCET/ROXICET) 5-325 MG per tablet Take 1-2 tablets by mouth every 4 (four) hours as needed for severe pain. 01/31/15   Verda Cumins Carsyn Boster, PA-C  varenicline (CHANTIX STARTING MONTH PAK) 0.5 MG X 11 & 1 MG X 42 tablet Take one 0.5 mg tab PO once daily for 3 days, then increase to one 0.5 mg tab BID for 4 days, then increase to one 1 mg tablet  BID. 03/15/14   Zigmund Gottron, MD   BP 155/93 mmHg  Pulse 82  Temp(Src) 97.7 F (36.5 C) (Oral)  Resp 18  SpO2 100% Physical Exam  Constitutional: He appears well-developed and well-nourished. No distress.  HENT:  Head: Normocephalic and atraumatic.  Eyes: Right eye exhibits no discharge. Left eye exhibits no discharge.  Cardiovascular:  Bilateral radial, posterior tibialis and dorsalis pedis pulses are intact.   Pulmonary/Chest: Effort normal. No respiratory distress.  Musculoskeletal:  Full ROM of left great toe. Mildly tender to palpation.  Right great toe previously amputated.   Neurological: He is alert. Coordination normal.  Skin: Skin is warm and dry. He  is not diaphoretic. There is erythema.  Ulceration of his great left toe with overlying erythema. No discharge noted.   Psychiatric: He has a normal mood and affect. His behavior is normal.  Nursing note and vitals reviewed.   ED Course  Procedures (including critical care time) Labs Review Labs Reviewed  BASIC METABOLIC PANEL - Abnormal; Notable for the following:    Glucose, Bld 109 (*)    All other components within normal limits  SEDIMENTATION RATE - Abnormal; Notable for the following:    Sed Rate 23 (*)    All other components within normal limits  C-REACTIVE PROTEIN - Abnormal; Notable for the following:    CRP 0.9 (*)    All other components within normal limits  CBC WITH DIFFERENTIAL/PLATELET    Imaging Review Dg Foot Complete Left  01/31/2015   CLINICAL DATA:  Peripheral neuropathy. Ulceration of the left great toe, worsening recently.  EXAM: LEFT FOOT - COMPLETE 3+ VIEW  COMPARISON:  03/07/2014, 05/30/2013, 07/04/2012.  FINDINGS: Soft tissue ulceration/deformity remains evident along the medial aspect of the great toe at the level of the inter phalangeal joint. A bony excrescence previously seen along the medial margin of the distal phalanx may be eroded. This could be subtle or early evidence of osteomyelitis in this location. No advanced osteomyelitis. There is degenerative disease of the inter phalangeal joint itself. 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  IMPRESSION: Soft tissue ulceration/ deformity medial to the inter phalangeal joint of the great toe. Early erosive appearance of a bony excrescence along the medial aspect of the distal phalanx, compared to previous exams, that could indicate early osteomyelitis.   Electronically Signed   By: Nelson Chimes M.D.   On: 01/31/2015 11:22     EKG Interpretation None      Filed Vitals:   01/31/15 1016 01/31/15 1332  BP: 180/94 155/93  Pulse: 91 82  Temp: 97.7 F (36.5  C)   TempSrc: Oral   Resp: 22 18  SpO2: 100% 100%     MDM   Meds given in ED:  Medications - No data to display  Discharge Medication List as of 01/31/2015  2:11 PM    START taking these medications   Details  oxyCODONE-acetaminophen (PERCOCET/ROXICET) 5-325 MG per tablet Take 1-2 tablets by mouth every 4 (four) hours as needed for severe pain., Starting 01/31/2015, Until Discontinued, Print        Final diagnoses:  Ulcer of great toe, left, with unspecified severity    This is a 59 y.o. male with a history of foot neuropathy, hypertension, right great toe amputation, and left great toe ulcer who presents to the ED complaining of worsening pain in his left great toe. Patient reports he has had an ulcer on  his left great toe for more than 10 years and this is worsened in the past 2 weeks. The patient is afebrile and non-toxic appearing. He has an ulceration to his left great toe with overlying erythema. No discharged noted. No signs of cellulitis.  X-ray obtained which indicated possible early osteomyelitis. His  BMP and CBC are within normal limits. He does not have an elevated white count. Sed rate is 23. CRP is pending and lab reports it could take a day to return.  Spoke with orthopedic surgeon Dr. Fredonia Highland who would like the patient to follow up as an outpatient next week with Dr. Sharol Given. Wound care provided with wet to dry dressing. Education provided on wound care to patient. I advised him to follow up with Dr Sharol Given next week. Prescription given for percocet. Narcotic pain medication precautions provided. I advised the patient to follow-up with their primary care provider this week. I advised the patient to return to the emergency department with new or worsening symptoms or new concerns. The patient verbalized understanding and agreement with plan.    This patient was discussed with Dr. Colin Rhein who agrees with assessment and plan.     Hanley Hays, PA-C 01/31/15  1849  Debby Freiberg, MD 02/01/15 [phone]

## 2015-01-31 NOTE — Discharge Instructions (Signed)
Please follow up with Dr. Sharol Given next week. See follow up instructions for further.  Skin Ulcer A skin ulcer is an open sore that can be shallow or deep. Skin ulcers sometimes become infected and are difficult to treat. It may be 1 month or longer before real healing progress is made. CAUSES   Injury.  Problems with the veins or arteries.  Diabetes.  Insect bites.  Bedsores.  Inflammatory conditions. SYMPTOMS   Pain, redness, swelling, and tenderness around the ulcer.  Fever.  Bleeding from the ulcer.  Yellow or clear fluid coming from the ulcer. DIAGNOSIS  There are many types of skin ulcers. Any open sores will be examined. Certain tests will be done to determine the kind of ulcer you have. The right treatment depends on the type of ulcer you have. TREATMENT  Treatment is a long-term challenge. It may include:  Wearing an elastic wrap, compression stockings, or gel cast over the ulcer area.  Taking antibiotic medicines or putting antibiotic creams on the affected area if there is an infection. HOME CARE INSTRUCTIONS  Put on your bandages (dressings), wraps, or casts over the ulcer as directed by your caregiver.  Change all dressings as directed by your caregiver.  Take all medicines as directed by your caregiver.  Keep the affected area clean and dry.  Avoid injuries to the affected area.  Eat a well-balanced, healthy diet that includes plenty of fruit and vegetables.  If you smoke, consider quitting or decreasing the amount of cigarettes you smoke.  Once the ulcer heals, get regular exercise as directed by your caregiver.  Work with your caregiver to make sure your blood pressure, cholesterol, and diabetes are well-controlled.  Keep your skin moisturized. Dry skin can crack and lead to skin ulcers. SEEK IMMEDIATE MEDICAL CARE IF:   Your pain gets worse.  You have swelling, redness, or fluids around the ulcer.  You have chills.  You have a fever. MAKE  SURE YOU:   Understand these instructions.  Will watch your condition.  Will get help right away if you are not doing well or get worse. Document Released: 12/23/2004 Document Revised: 02/07/2012 Document Reviewed: 07/02/2011 [facility] Patient Information 2015 [place], [place]. This information is not intended to replace advice given to you by your health care provider. Make sure you discuss any questions you have with your health care provider.

## 2015-01-31 NOTE — ED Notes (Signed)
Patient states he has has had pain and redness in the past 2 weeks to the left great toe and left 2nd toe. Patient states he has ben soaking the left foot in epsom salt and taking Tylenol. Patient states the Tylenol does not relieve his pain.

## 2015-02-10 DIAGNOSIS — L97521 Non-pressure chronic ulcer of other part of left foot limited to breakdown of skin: Secondary | ICD-10-CM | POA: Diagnosis not present

## 2015-02-19 DIAGNOSIS — Z89411 Acquired absence of right great toe: Secondary | ICD-10-CM | POA: Diagnosis not present

## 2015-02-19 DIAGNOSIS — R3989 Other symptoms and signs involving the genitourinary system: Secondary | ICD-10-CM | POA: Diagnosis not present

## 2015-02-19 DIAGNOSIS — R062 Wheezing: Secondary | ICD-10-CM | POA: Diagnosis not present

## 2015-02-19 DIAGNOSIS — G629 Polyneuropathy, unspecified: Secondary | ICD-10-CM | POA: Diagnosis not present

## 2015-02-19 DIAGNOSIS — R739 Hyperglycemia, unspecified: Secondary | ICD-10-CM | POA: Diagnosis not present

## 2015-02-19 DIAGNOSIS — Z6838 Body mass index (BMI) 38.0-38.9, adult: Secondary | ICD-10-CM | POA: Diagnosis not present

## 2015-02-19 DIAGNOSIS — M545 Low back pain: Secondary | ICD-10-CM | POA: Diagnosis not present

## 2015-02-19 DIAGNOSIS — L97529 Non-pressure chronic ulcer of other part of left foot with unspecified severity: Secondary | ICD-10-CM | POA: Diagnosis not present

## 2015-02-19 DIAGNOSIS — Z1389 Encounter for screening for other disorder: Secondary | ICD-10-CM | POA: Diagnosis not present

## 2015-02-19 DIAGNOSIS — F17200 Nicotine dependence, unspecified, uncomplicated: Secondary | ICD-10-CM | POA: Diagnosis not present

## 2015-02-19 DIAGNOSIS — Z87448 Personal history of other diseases of urinary system: Secondary | ICD-10-CM | POA: Diagnosis not present

## 2015-03-03 DIAGNOSIS — M25562 Pain in left knee: Secondary | ICD-10-CM | POA: Diagnosis not present

## 2015-03-03 DIAGNOSIS — L97521 Non-pressure chronic ulcer of other part of left foot limited to breakdown of skin: Secondary | ICD-10-CM | POA: Diagnosis not present

## 2015-03-24 DIAGNOSIS — M25562 Pain in left knee: Secondary | ICD-10-CM | POA: Diagnosis not present

## 2015-03-24 DIAGNOSIS — M1712 Unilateral primary osteoarthritis, left knee: Secondary | ICD-10-CM | POA: Diagnosis not present

## 2015-03-25 DIAGNOSIS — E785 Hyperlipidemia, unspecified: Secondary | ICD-10-CM | POA: Diagnosis not present

## 2015-03-25 DIAGNOSIS — R739 Hyperglycemia, unspecified: Secondary | ICD-10-CM | POA: Diagnosis not present

## 2015-03-25 DIAGNOSIS — Z125 Encounter for screening for malignant neoplasm of prostate: Secondary | ICD-10-CM | POA: Diagnosis not present

## 2015-03-31 ENCOUNTER — Encounter: Payer: Self-pay | Admitting: Internal Medicine

## 2015-03-31 DIAGNOSIS — E785 Hyperlipidemia, unspecified: Secondary | ICD-10-CM | POA: Diagnosis not present

## 2015-03-31 DIAGNOSIS — Z23 Encounter for immunization: Secondary | ICD-10-CM | POA: Diagnosis not present

## 2015-03-31 DIAGNOSIS — G629 Polyneuropathy, unspecified: Secondary | ICD-10-CM | POA: Diagnosis not present

## 2015-03-31 DIAGNOSIS — B351 Tinea unguium: Secondary | ICD-10-CM | POA: Diagnosis not present

## 2015-03-31 DIAGNOSIS — H9311 Tinnitus, right ear: Secondary | ICD-10-CM | POA: Diagnosis not present

## 2015-03-31 DIAGNOSIS — Z6838 Body mass index (BMI) 38.0-38.9, adult: Secondary | ICD-10-CM | POA: Diagnosis not present

## 2015-03-31 DIAGNOSIS — N390 Urinary tract infection, site not specified: Secondary | ICD-10-CM | POA: Diagnosis not present

## 2015-03-31 DIAGNOSIS — Z79899 Other long term (current) drug therapy: Secondary | ICD-10-CM | POA: Diagnosis not present

## 2015-03-31 DIAGNOSIS — R809 Proteinuria, unspecified: Secondary | ICD-10-CM | POA: Diagnosis not present

## 2015-03-31 DIAGNOSIS — F17200 Nicotine dependence, unspecified, uncomplicated: Secondary | ICD-10-CM | POA: Diagnosis not present

## 2015-03-31 DIAGNOSIS — R8290 Unspecified abnormal findings in urine: Secondary | ICD-10-CM | POA: Diagnosis not present

## 2015-04-01 ENCOUNTER — Other Ambulatory Visit: Payer: Self-pay | Admitting: Internal Medicine

## 2015-04-01 DIAGNOSIS — F17200 Nicotine dependence, unspecified, uncomplicated: Secondary | ICD-10-CM

## 2015-04-01 DIAGNOSIS — Z1212 Encounter for screening for malignant neoplasm of rectum: Secondary | ICD-10-CM | POA: Diagnosis not present

## 2015-04-04 ENCOUNTER — Ambulatory Visit (INDEPENDENT_AMBULATORY_CARE_PROVIDER_SITE_OTHER): Payer: Medicare Other

## 2015-04-04 DIAGNOSIS — B351 Tinea unguium: Secondary | ICD-10-CM

## 2015-04-04 DIAGNOSIS — M79673 Pain in unspecified foot: Secondary | ICD-10-CM | POA: Diagnosis not present

## 2015-04-04 DIAGNOSIS — R52 Pain, unspecified: Secondary | ICD-10-CM

## 2015-04-04 NOTE — Progress Notes (Signed)
   Subjective:    Patient ID: [name], male    DOB: [birth year], 59 y.o.   MRN: [id number]  HPI    Review of Systems     Objective:   Physical Exam        Assessment & Plan:

## 2015-04-04 NOTE — Progress Notes (Signed)
HPI Presents today chief complaint of painful elongated toenails.  Objective: Pulses are palpable bilateral nails are thick, yellow dystrophic onychomycosis and painful palpation.   Assessment: Onychomycosis with pain in limb.  Plan: Treatment of nails in thickness and length as covered service secondary to pain. HPI Presents today chief complaint of painful elongated toenails.  Objective: Pulses are palpable bilateral nails are thick, yellow dystrophic onychomycosis and painful palpation.   Assessment: Onychomycosis with pain in limb.  Plan: Treatment of nails in thickness and length as covered service secondary to pain.

## 2015-04-09 ENCOUNTER — Ambulatory Visit
Admission: RE | Admit: 2015-04-09 | Discharge: 2015-04-09 | Disposition: A | Discharge status: Home / Self Care | Payer: Medicare Other | Source: Ambulatory Visit | Attending: Internal Medicine | Admitting: Internal Medicine

## 2015-04-09 DIAGNOSIS — Z87891 Personal history of nicotine dependence: Secondary | ICD-10-CM | POA: Diagnosis not present

## 2015-04-09 DIAGNOSIS — Z122 Encounter for screening for malignant neoplasm of respiratory organs: Secondary | ICD-10-CM | POA: Diagnosis not present

## 2015-04-09 DIAGNOSIS — F17200 Nicotine dependence, unspecified, uncomplicated: Secondary | ICD-10-CM

## 2015-04-18 ENCOUNTER — Ambulatory Visit (INDEPENDENT_AMBULATORY_CARE_PROVIDER_SITE_OTHER): Payer: Medicare Other

## 2015-04-18 VITALS — BP 123/74 | HR 78 | Resp 16

## 2015-04-18 DIAGNOSIS — Q828 Other specified congenital malformations of skin: Secondary | ICD-10-CM

## 2015-04-18 DIAGNOSIS — B351 Tinea unguium: Secondary | ICD-10-CM | POA: Diagnosis not present

## 2015-04-18 DIAGNOSIS — M79676 Pain in unspecified toe(s): Secondary | ICD-10-CM

## 2015-04-18 NOTE — Progress Notes (Signed)
   Subjective:    Patient ID: [name], male    DOB: 1956-04-27, 59 y.o.   MRN: [id number]  HPI Patient is being seen by Dr Prudence Davidson today.   Review of Systems  All other systems reviewed and are negative. Complaint:  Visit Type: Patient returns to my office for continued preventative foot care services. Complaint: Patient states" my nails have grown long and thick and become painful to walk and wear shoes" Patient has been diagnosed with DM with no complications. He presents for preventative foot care services. No changes to ROS  Podiatric Exam: Vascular: dorsalis pedis and posterior tibial pulses are palpable bilateral. Capillary return is immediate. Temperature gradient is WNL. Skin turgor WNL  Sensorium: Normal Semmes Weinstein monofilament test. Normal tactile sensation bilaterally. Nail Exam: Pt has thick disfigured discolored nails with subungual debris noted bilateral entire nail hallux through fifth toenails Ulcer Exam: There is no evidence of ulcer or pre-ulcerative changes or infection. Orthopedic Exam: Muscle tone and strength are WNL. No limitations in general ROM. No crepitus or effusions noted. Foot type and digits show no abnormalities. Bony prominences are unremarkable. Skin: Porokeratosis L Hallux . No infection or ulcers  Diagnosis:  Tinea unguium, Pain in right toe, pain in left toes, peripheral neuropathy B/L , DJD IPJ, healing hyperkeratotic callus  Treatment & Plan Procedures and Treatment: Consent by patient was obtained for treatment procedures. The patient understood the discussion of treatment and procedures well. All questions were answered thoroughly reviewed. Debridement of mycotic and hypertrophic toenails, 1 through 5 bilateral and clearing of subungual debris. No ulceration, no infection noted.  Return Visit-Office Procedure: Patient instructed to return to the office for a follow up visit 3 months for continued evaluation and treatment.     Objective:     Physical Exam        Assessment & Plan:

## 2015-05-08 ENCOUNTER — Ambulatory Visit (INDEPENDENT_AMBULATORY_CARE_PROVIDER_SITE_OTHER): Payer: Medicare Other | Admitting: Podiatry

## 2015-05-08 ENCOUNTER — Encounter: Payer: Self-pay | Admitting: Podiatry

## 2015-05-08 VITALS — BP 132/85 | HR 79 | Temp 98.0°F | Resp 14

## 2015-05-08 DIAGNOSIS — L89891 Pressure ulcer of other site, stage 1: Secondary | ICD-10-CM | POA: Diagnosis not present

## 2015-05-08 DIAGNOSIS — L97521 Non-pressure chronic ulcer of other part of left foot limited to breakdown of skin: Secondary | ICD-10-CM

## 2015-05-08 NOTE — Progress Notes (Signed)
Patient ID: [name], male   DOB: 06-10-1956, 59 y.o.   MRN: [id number]    This patient returns to the office  With continued breakdown and drainage coming from left big toe.   This patient is neuropathic and the ulcer is only occasionly soaked.  Patient says he believes the toe is doing well.      Objective: Review of past medical history, medications, social history and allergies were performed.  Vascular: Dorsalis pedis and posterior tibial pulses were palpable B/L, capillary refill was  WNL B/L, temperature gradient was WNL B/L   Skin:  Ulcer noed left hallux.  The ulcer measures 11mm x 15 mm. Scant amount of drainage. No infection or odor noted.  There is significant necrotic tissue.  Nails: appear healthy with no signs of mycosis or infections  Sensory:  Absent Thornell Mule monifilament    Orthopedic: Orthopedic evaluation demonstrates all joints distal t ankle have full ROM without crepitus, muscle power WNL B/L  Dx  Ulcer left hallux.  Debridement of necrotic tissue to bleeding. Neosporin DSD.  Patient does not appear and concerned about the ulcer.  I am concerned about the ulcer breaking down.  I decided to try surgical shoe to change his gait and allow the ulcer to heal.  Home instructions.  RTC in 2 weeks.

## 2015-05-20 ENCOUNTER — Ambulatory Visit: Payer: Medicare Other | Admitting: Podiatry

## 2015-05-26 ENCOUNTER — Ambulatory Visit (AMBULATORY_SURGERY_CENTER): Payer: Self-pay | Admitting: *Deleted

## 2015-05-26 VITALS — Ht 79.0 in | Wt 348.0 lb

## 2015-05-26 DIAGNOSIS — Z1211 Encounter for screening for malignant neoplasm of colon: Secondary | ICD-10-CM

## 2015-05-26 NOTE — Progress Notes (Signed)
No egg or soy allergy No issues with past sedation No diet pills No home 02 use emmi to e mail Pt's weight is 348 lb with clothes and shoes. He uses a cane and was noted to be a little short of breath in PV. He states he has peripheral neuropathy and it makes it hard for him to get around at times and the heat bothers him as well he states.

## 2015-06-05 ENCOUNTER — Ambulatory Visit (INDEPENDENT_AMBULATORY_CARE_PROVIDER_SITE_OTHER): Payer: Medicare Other | Admitting: Podiatry

## 2015-06-05 ENCOUNTER — Encounter: Payer: Self-pay | Admitting: Podiatry

## 2015-06-05 VITALS — BP 141/91 | HR 96 | Resp 18

## 2015-06-05 DIAGNOSIS — L89891 Pressure ulcer of other site, stage 1: Secondary | ICD-10-CM | POA: Diagnosis not present

## 2015-06-05 DIAGNOSIS — G629 Polyneuropathy, unspecified: Secondary | ICD-10-CM

## 2015-06-05 DIAGNOSIS — L97521 Non-pressure chronic ulcer of other part of left foot limited to breakdown of skin: Secondary | ICD-10-CM

## 2015-06-05 NOTE — Progress Notes (Signed)
Subjective:     Patient ID: [name], male   DOB: 1956-05-21, 59 y.o.   MRN: [id number]  HPIThis patient returns for continued evaluatiuon of ulcer left big toe.  She presented to the office months ago and was treated with debridement and dressing.  His last visit drainage persisted and after debridement of ulcer , he was dispensed surgical shoes which he has worn faithfully.  He now presents for evaluation saying all drainage has stopped.  The ulcer has callus top.   Review of Systems     Objective:   Physical Exam Objective: Review of past medical history, medications, social history and allergies were performed.  Vascular: Dorsalis pedis and posterior tibial pulses were palpable B/L, capillary refill was  WNL B/L, temperature gradient was WNL B/L   Skin:  No signs of symptoms of infection .  Evaluation of ulcer reveals ulcer has closed with hyperkeratotic roof.  No drainage or malodor noted. Hyperkeratotic roof noted.  Nails: appear healthy with no signs of mycosis or infections  Sensory: Semmes Weinstein monifilament Absent   Orthopedic: Orthopedic evaluation demonstrates all joints distal t ankle have full ROM without crepitus, muscle power WNL B/L     Assessment:     Diabetic ulcer.       Plan:     ROV.  Debridement of ulcer revealing closed base of ulcer.  Continue oin shoe.  RTC 3 weeks.

## 2015-06-09 ENCOUNTER — Encounter: Payer: Self-pay | Admitting: Internal Medicine

## 2015-06-09 ENCOUNTER — Ambulatory Visit (AMBULATORY_SURGERY_CENTER): Payer: Medicare Other | Admitting: Internal Medicine

## 2015-06-09 VITALS — BP 130/77 | HR 80 | Temp 97.1°F | Resp 16 | Ht 79.0 in | Wt 348.0 lb

## 2015-06-09 DIAGNOSIS — D122 Benign neoplasm of ascending colon: Secondary | ICD-10-CM

## 2015-06-09 DIAGNOSIS — I10 Essential (primary) hypertension: Secondary | ICD-10-CM | POA: Diagnosis not present

## 2015-06-09 DIAGNOSIS — K635 Polyp of colon: Secondary | ICD-10-CM | POA: Diagnosis not present

## 2015-06-09 DIAGNOSIS — Z1211 Encounter for screening for malignant neoplasm of colon: Secondary | ICD-10-CM

## 2015-06-09 DIAGNOSIS — Z860101 Personal history of adenomatous and serrated colon polyps: Secondary | ICD-10-CM | POA: Insufficient documentation

## 2015-06-09 DIAGNOSIS — Z6839 Body mass index (BMI) 39.0-39.9, adult: Secondary | ICD-10-CM | POA: Diagnosis not present

## 2015-06-09 DIAGNOSIS — D124 Benign neoplasm of descending colon: Secondary | ICD-10-CM | POA: Diagnosis not present

## 2015-06-09 DIAGNOSIS — Z86010 Personal history of colonic polyps: Secondary | ICD-10-CM

## 2015-06-09 DIAGNOSIS — D120 Benign neoplasm of cecum: Secondary | ICD-10-CM

## 2015-06-09 HISTORY — DX: Personal history of colonic polyps: Z86.010

## 2015-06-09 HISTORY — DX: Personal history of adenomatous and serrated colon polyps: Z86.0101

## 2015-06-09 MED ORDER — SODIUM CHLORIDE 0.9 % IV SOLN: 500.0000 mL | INTRAVENOUS | Status: DC

## 2015-06-09 NOTE — Op Note (Signed)
[place]  [address] [place], [id number]   COLONOSCOPY PROCEDURE REPORT  PATIENT: Cory, [name]  MR#: [id number] BIRTHDATE: 26-Jan-1956 , 58  yrs. old GENDER: male ENDOSCOPIST: [name], MD, [facility] PROCEDURE DATE:  06/09/2015 PROCEDURE:   Colonoscopy, screening and Colonoscopy with snare polypectomy First Screening Colonoscopy - Avg.  risk and is 50 yrs.  old or older Yes.  Prior Negative Screening - Now for repeat screening. N/A  History of Adenoma - Now for follow-up colonoscopy & has been > or = to 3 yrs.  N/A  Polyps removed today? Yes ASA CLASS:   Class II INDICATIONS:Screening for colonic neoplasia and Colorectal Neoplasm Risk Assessment for this procedure is average risk. MEDICATIONS: Propofol 300 mg IV  DESCRIPTION OF PROCEDURE:   After the risks benefits and alternatives of the procedure were thoroughly explained, informed consent was obtained.  The digital rectal exam revealed no abnormalities of the rectum, revealed no prostatic nodules, and revealed the prostate was not enlarged.   The LB [id number] endoscope was introduced through the anus and advanced to the cecum, which was identified by both the appendix and ileocecal valve. No adverse events experienced.   The quality of the prep was (MiraLax was used) good.  The instrument was then slowly withdrawn as the colon was fully examined. Estimated blood loss is zero unless otherwise noted in this procedure report.  COLON FINDINGS: Four sessile polyps ranging from 2 to 86mm in size were found at the cecum, in the ascending colon, and descending colon.  Polypectomies were performed with cold forceps (ascending x 1), with a cold snare (cecum x 2) and using snare cautery (descending x 1).  The resection was complete, the polyp tissue was completely retrieved and sent to histology.   There was moderate diverticulosis noted in the sigmoid colon.   The examination was otherwise normal.      The time to cecum = 2.1 Withdrawal time = 14.6   The scope was withdrawn and the procedure completed. COMPLICATIONS: There were no immediate complications.  ENDOSCOPIC IMPRESSION: 1.   Four sessile polyps ranging from 2 to 92mm in size were found at the cecum, in the ascending colon, and descending colon; polypectomies were performed with cold forceps, with a cold snare and using snare cautery 2.   Moderate diverticulosis was noted in the sigmoid colon 3.   The examination was otherwise normal - good prep  RECOMMENDATIONS: 1.  Hold Aspirin and all other NSAIDS for 2 weeks. 2.  Timing of repeat colonoscopy will be determined by pathology findings.  eSigned:  [name], MD, [facility] 06/09/2015 11:38 AM   cc: [name], MD and The Patient

## 2015-06-09 NOTE — Progress Notes (Signed)
  [facility] Post-op Note  Patient: [name]  Procedure(s) Performed: colonoscopy  Patient Location: [facility] - Recovery Area  Anesthesia Type: Deep Sedation/Propofol  Level of Consciousness: awake, oriented and patient cooperative  Airway and Oxygen Therapy: Patient Spontanous Breathing  Post-op Pain: none  Post-op Assessment:  Post-op Vital signs reviewed, Patient's Cardiovascular Status Stable, Respiratory Function Stable, Patent Airway, No signs of Nausea or vomiting and Pain level controlled  Post-op Vital Signs: Reviewed and stable  Complications: No apparent anesthesia complications  [name] E 11:37 AM

## 2015-06-09 NOTE — Progress Notes (Signed)
Called to room to assist during endoscopic procedure.  Patient ID and intended procedure confirmed with present staff. Received instructions for my participation in the procedure from the performing physician.  

## 2015-06-09 NOTE — Patient Instructions (Addendum)
I found and removed 4 polyps - all look benign.  You also have a condition called diverticulosis - common and not usually a problem. Please read the handout provided.  I will let you know pathology results and when to have another routine colonoscopy by mail.  I appreciate the opportunity to care for you. [name], MD, FACG  HOLD ASPIRIN, ASPIRIN PRODUCTS AND ALL NSAIDS (MOTRIN, IBUPROFEN, ADVIL, NAPROSYN, ALEVE ETC) FOR TWO WEEKS.(June 23, 2015)YOU HAD AN ENDOSCOPIC PROCEDURE TODAY AT [place]:   Refer to the procedure report that was given to you for any specific questions about what was found during the examination.  If the procedure report does not answer your questions, please call your gastroenterologist to clarify.  If you requested that your care partner not be given the details of your procedure findings, then the procedure report has been included in a sealed envelope for you to review at your convenience later.  YOU SHOULD EXPECT: Some feelings of bloating in the abdomen. Passage of more gas than usual.  Walking can help get rid of the air that was put into your GI tract during the procedure and reduce the bloating. If you had a lower endoscopy (such as a colonoscopy or flexible sigmoidoscopy) you may notice spotting of blood in your stool or on the toilet paper. If you underwent a bowel prep for your procedure, you may not have a normal bowel movement for a few days.  Please Note:  You might notice some irritation and congestion in your nose or some drainage.  This is from the oxygen used during your procedure.  There is no need for concern and it should clear up in a day or so.  SYMPTOMS TO REPORT IMMEDIATELY:   Following lower endoscopy (colonoscopy or flexible sigmoidoscopy):  Excessive amounts of blood in the stool  Significant tenderness or worsening of abdominal pains  Swelling of the abdomen that is new, acute  Fever of 100F or  higher   For urgent or emergent issues, a gastroenterologist can be reached at any hour by calling [phone].   DIET: Your first meal following the procedure should be a small meal and then it is ok to progress to your normal diet. Heavy or fried foods are harder to digest and may make you feel nauseous or bloated.  Likewise, meals heavy in dairy and vegetables can increase bloating.  Drink plenty of fluids but you should avoid alcoholic beverages for 24 hours.  ACTIVITY:  You should plan to take it easy for the rest of today and you should NOT DRIVE or use heavy machinery until tomorrow (because of the sedation medicines used during the test).    FOLLOW UP: Our staff will call the number listed on your records the next business day following your procedure to check on you and address any questions or concerns that you may have regarding the information given to you following your procedure. If we do not reach you, we will leave a message.  However, if you are feeling well and you are not experiencing any problems, there is no need to return our call.  We will assume that you have returned to your regular daily activities without incident.  If any biopsies were taken you will be contacted by phone or by letter within the next 1-3 weeks.  Please call us at [phone] if you have not heard about the biopsies in 3 weeks.    SIGNATURES/CONFIDENTIALITY: You  and/or your care partner have signed paperwork which will be entered into your electronic medical record.  These signatures attest to the fact that that the information above on your After Visit Summary has been reviewed and is understood.  Full responsibility of the confidentiality of this discharge information lies with you and/or your care-partner.

## 2015-06-10 ENCOUNTER — Telehealth: Payer: Self-pay

## 2015-06-10 NOTE — Telephone Encounter (Signed)
  Follow up Call-  Call back number 06/09/2015  Post procedure Call Back phone  # [phone]  Permission to leave phone message Yes     Patient questions:  Do you have a fever, pain , or abdominal swelling? No. Pain Score  0 *  Have you tolerated food without any problems? Yes.    Have you been able to return to your normal activities? Yes.    Do you have any questions about your discharge instructions: Diet   No. Medications  No. Follow up visit  No.  Do you have questions or concerns about your Care? No.  Actions: * If pain score is 4 or above: No action needed, pain <4.

## 2015-06-16 ENCOUNTER — Encounter: Payer: Self-pay | Admitting: Internal Medicine

## 2015-06-16 DIAGNOSIS — Z86010 Personal history of colonic polyps: Secondary | ICD-10-CM

## 2015-06-16 NOTE — Progress Notes (Signed)
Quick Note:  3 adenomas and a TV adenoma max 10 mm - repeat colonoscopy 2019 ______

## 2015-07-03 ENCOUNTER — Ambulatory Visit: Payer: Medicare Other | Admitting: Podiatry

## 2015-07-04 ENCOUNTER — Encounter: Payer: Self-pay | Admitting: Podiatry

## 2015-07-04 ENCOUNTER — Ambulatory Visit (INDEPENDENT_AMBULATORY_CARE_PROVIDER_SITE_OTHER): Payer: Medicare Other | Admitting: Podiatry

## 2015-07-04 ENCOUNTER — Other Ambulatory Visit: Payer: Self-pay | Admitting: Acute Care

## 2015-07-04 ENCOUNTER — Telehealth: Payer: Self-pay | Admitting: Acute Care

## 2015-07-04 VITALS — BP 173/109 | HR 88 | Resp 18

## 2015-07-04 DIAGNOSIS — F17210 Nicotine dependence, cigarettes, uncomplicated: Secondary | ICD-10-CM

## 2015-07-04 DIAGNOSIS — M799 Soft tissue disorder, unspecified: Secondary | ICD-10-CM | POA: Diagnosis not present

## 2015-07-04 DIAGNOSIS — L84 Corns and callosities: Secondary | ICD-10-CM

## 2015-07-04 DIAGNOSIS — L97521 Non-pressure chronic ulcer of other part of left foot limited to breakdown of skin: Secondary | ICD-10-CM

## 2015-07-04 DIAGNOSIS — M7989 Other specified soft tissue disorders: Secondary | ICD-10-CM

## 2015-07-04 DIAGNOSIS — B070 Plantar wart: Secondary | ICD-10-CM | POA: Diagnosis not present

## 2015-07-04 NOTE — Patient Instructions (Signed)
Keep bandage on for the next couple of days. After you can remove it and apply antibiotic ointment and a band-aid. Monitor for any signs/symptoms of infection. Call the office immediately if any occur or go directly to the emergency room. Call with any questions/concerns.

## 2015-07-04 NOTE — Telephone Encounter (Signed)
I have called and scheduled [name] for his follow up CT scan on 07/17/15 at 10 am at the [facility] at [address].He verbalized time and location of the scan and has my contact information in the event he has any questions before then.I told him I would call results as soon as I have them.He verbalized understanding.

## 2015-07-07 ENCOUNTER — Encounter: Payer: Self-pay | Admitting: Podiatry

## 2015-07-07 NOTE — Progress Notes (Signed)
Patient ID: [name], male   DOB: 1956/11/10, 59 y.o.   MRN: [id number]  Subjective: 59 year old male presents the office they for continued care and follow-up evaluation of an ulceration is left hallux. He states that since last appointment does not believe that the skin is felt that as much as what it was compared to last appointment. Discontinue the surgical shoe. He denies any surrounding redness, red streaks or any drainage/purulence. He denies any systemic complaints as fevers, chills, nausea, vomiting. Denies any calf pain, chest pain, shortness of breath. No other complaints at this time in no acute changes since last appointment.  Objective: AAO x 3, NAD DP/PT pulses palpable, CRT less than 3 seconds Protective sensation decreased with Simms Weinstein monofilament On the medial aspect the left hallux there is a large cauliflower-like lesion. Upon debridement underlying skin is intact there is no ulceration identified. There is no surrounding erythema, ascending cellulitis, fluctuance, crepitus, drainage/purulence, malodor. No other open lesions or pre-ulcerative lesions identified bilaterally. No areas of tenderness to bilateral lower chemise. No overlying edema, erythema, increase in warmth. No pain with calf compression, swelling, warmth, erythema.  Assessment: 59 year old male with recurrent lesion left medial hallux  Plan: -Treatment options discussed including all alternatives, risks, and complications -Due to continuation of the ulcer/lesion which is apparently been ongoing since 2005 without much change and he recommended biopsy at this time. I discussed risks and indications of the biopsy for which the patient understands and consents. -Under sterile conditions a total of 1 mL mixture of 2% lidocaine plain and 2.5% Marcaine was infiltrated along the dorsal portion of the hallux underneath the area of biopsy. Once anesthetized the skin was then prepped in sterile fashion. A 4  mm punch biopsy was then obtained to remove a portion of tissue from the periphery of the lesion. A scissor was utilized to cut the deep portion. The biopsy was placed in formalin and sent to pathology for evaluation. Areas could proceed irrigated. A single 4-0 nylon suture was then placed. Antibiotic ointment was placed over the area followed by dry sterile dressing. Post procedure instructions were discussed the patient. Monitor for any clinical signs or symptoms of infection and directed to call the office immediately should any occur or go to the ER. The remainder the lesion was also debrided to reveal the underlying skin intact. -Follow-up 1 week for suture removal or sooner if any problems arise. In the meantime, encouraged to call the office with any questions, concerns, change in symptoms.   Celesta Gentile, DPM

## 2015-07-11 ENCOUNTER — Ambulatory Visit (INDEPENDENT_AMBULATORY_CARE_PROVIDER_SITE_OTHER): Payer: Medicare Other | Admitting: Podiatry

## 2015-07-11 ENCOUNTER — Encounter: Payer: Self-pay | Admitting: Podiatry

## 2015-07-11 VITALS — BP 125/74 | HR 79 | Resp 12

## 2015-07-11 DIAGNOSIS — B079 Viral wart, unspecified: Secondary | ICD-10-CM

## 2015-07-11 DIAGNOSIS — B078 Other viral warts: Secondary | ICD-10-CM

## 2015-07-11 MED ORDER — FLUOROURACIL 5 % EX CREA: TOPICAL_CREAM | Freq: Two times a day (BID) | CUTANEOUS | Status: DC

## 2015-07-14 ENCOUNTER — Encounter: Payer: Self-pay | Admitting: Podiatry

## 2015-07-14 NOTE — Progress Notes (Signed)
Patient ID: [name], male   DOB: 1956/08/04, 59 y.o.   MRN: [id number]  Subjective: 59 year old male presents the office today for 1 week follow-up evaluation of biopsy to left medial hallux skin lesion. He states that overall he is doing well he's had no pain. He denies any redness or drainage from around the biopsy site or along the lesion. He continues with a surgical shoe. Denies any drainage or purulence. He denies any systemic complaints as fevers, chills, nausea, vomiting. Denies any calf pain, chest pain, shortness of breath. No other complaints at this time in no acute changes since last appointment.  Objective: AAO x 3, NAD DP/PT pulses palpable, CRT less than 3 seconds Protective sensation decreased with Simms Weinstein monofilament On the medial aspect the left hallux there is a large cauliflower-like lesion. A single sutures intact along the biopsy site. The biopsy site appears to be coapted and healed at this time. There is no swelling erythema, ascending saline disc, fluctuance, crepitus, malodor, drainage/purulence. Upon debridement of the lesion there is pinpoint bleeding and there is no underlying ulceration and the skin is intact. There is no drainage or purulence. There is no malodor. No other open lesions or pre-ulcerative lesions identified bilaterally. No areas of tenderness to bilateral lower chemise. No overlying edema, erythema, increase in warmth. No pain with calf compression, swelling, warmth, erythema.  Assessment: 59 year old male with recurrent lesion left medial hallux; verruca.  Plan: -Treatment options discussed including all alternatives, risks, and complications -Biopsy results were discussed with the patient which revealed verruca. -Lesion was debrided without complications. -Prescribed Efudex cream. Discussed applications instructions as well as side effects. Call the office with any concerns. -Follow-up 3 weeks or sooner if any problems arise. In the  meantime, encouraged to call the office with any questions, concerns, change in symptoms.   Celesta Gentile, DPM

## 2015-07-15 ENCOUNTER — Other Ambulatory Visit: Payer: Self-pay | Admitting: Internal Medicine

## 2015-07-15 DIAGNOSIS — R918 Other nonspecific abnormal finding of lung field: Secondary | ICD-10-CM

## 2015-07-16 ENCOUNTER — Ambulatory Visit
Admission: RE | Admit: 2015-07-16 | Discharge: 2015-07-16 | Disposition: A | Discharge status: Home / Self Care | Payer: Medicare Other | Source: Ambulatory Visit | Attending: Internal Medicine | Admitting: Internal Medicine

## 2015-07-16 DIAGNOSIS — R911 Solitary pulmonary nodule: Secondary | ICD-10-CM | POA: Diagnosis not present

## 2015-07-16 DIAGNOSIS — R918 Other nonspecific abnormal finding of lung field: Secondary | ICD-10-CM

## 2015-07-16 DIAGNOSIS — I2510 Atherosclerotic heart disease of native coronary artery without angina pectoris: Secondary | ICD-10-CM | POA: Diagnosis not present

## 2015-07-17 ENCOUNTER — Telehealth: Payer: Self-pay | Admitting: *Deleted

## 2015-07-17 ENCOUNTER — Inpatient Hospital Stay: Admission: RE | Admit: 2015-07-17 | Payer: Medicare Other | Source: Ambulatory Visit

## 2015-07-17 NOTE — Telephone Encounter (Addendum)
I informed pt the results of the 07/04/2015 as plantar wart, pt states Dr. Jacqualyn Posey informed him of the results at the last visit and ordered a expensive medication over $100.00, that he hasn't used yet.  Pt states he would like an alternative medication.  Dr. Jacqualyn Posey states can do series of debridements and application of salinocaine in office.  I informed pt and he agreed, transferred pt to schedulers.

## 2015-07-18 NOTE — Telephone Encounter (Signed)
He can come in and we can do salinocaine in the office

## 2015-07-21 ENCOUNTER — Encounter: Payer: Self-pay | Admitting: Podiatry

## 2015-07-21 ENCOUNTER — Ambulatory Visit (INDEPENDENT_AMBULATORY_CARE_PROVIDER_SITE_OTHER): Payer: Medicare Other | Admitting: Podiatry

## 2015-07-21 VITALS — BP 158/99 | HR 92 | Resp 18

## 2015-07-21 DIAGNOSIS — B070 Plantar wart: Secondary | ICD-10-CM

## 2015-07-21 DIAGNOSIS — B079 Viral wart, unspecified: Secondary | ICD-10-CM

## 2015-07-21 DIAGNOSIS — B078 Other viral warts: Secondary | ICD-10-CM

## 2015-07-22 ENCOUNTER — Encounter: Payer: Self-pay | Admitting: Podiatry

## 2015-07-22 NOTE — Progress Notes (Signed)
  Subjective: 59 year old male presents the office today for treatment of left hallux verruca. I did prescribe Efudex cream however due to cost is able to get this. He presents today for treatment of the wart. He denies any pain to the area and denies any surrounding redness or drainage. No acute changes since last appointment and no other complaints at this time.  Objective: AAO 3, NAD Neurovascular status unchanged On the medial aspect of the left hallux there is an annular type appearing cauliflower lesion measuring 3 x 2 cm. Upon debridement the underlying skin is intact and there is pinpoint bleeding and evidence of verruca. There is no surrounding erythema, ascending cellulitis, fluctuance, crepitus, malodor, drainage. No other open lesions or pre-ulcerative lesions. No other areas of tenderness to bilateral lower extremities. There is no pain with calf compression, swelling, warmth, erythema.  Assessment: 59 year old male left hallux verruca  Plan: -Treatment options discussed including all alternatives, risks, and complications -Lesion was sharply debrided without complications. The area was cleaned. A pad was placed on the area and salinocaine was applied followed by a bandage. Post procedure care discussed the patient. Monitor for any clinical signs or symptoms of infection and directed to call the office immediately should any occur or go to the ER. -Follow-up 2 weeks for repeat treatment or sooner if any problems arise. In the meantime, encouraged to call the office with any questions, concerns, change in symptoms.   Celesta Gentile, DPM

## 2015-08-08 ENCOUNTER — Ambulatory Visit (INDEPENDENT_AMBULATORY_CARE_PROVIDER_SITE_OTHER): Payer: Medicare Other | Admitting: Podiatry

## 2015-08-08 ENCOUNTER — Encounter: Payer: Self-pay | Admitting: Podiatry

## 2015-08-08 VITALS — BP 138/91 | HR 85 | Resp 18

## 2015-08-08 DIAGNOSIS — B079 Viral wart, unspecified: Secondary | ICD-10-CM

## 2015-08-08 DIAGNOSIS — B078 Other viral warts: Secondary | ICD-10-CM

## 2015-08-08 DIAGNOSIS — B070 Plantar wart: Secondary | ICD-10-CM

## 2015-08-08 DIAGNOSIS — M79676 Pain in unspecified toe(s): Secondary | ICD-10-CM

## 2015-08-08 MED ORDER — TRAMADOL HCL 50 MG PO TABS: 50.0000 mg | ORAL_TABLET | Freq: Three times a day (TID) | ORAL | Status: DC | PRN

## 2015-08-08 MED ORDER — HYDROCODONE-ACETAMINOPHEN 5-325 MG PO TABS: 1.0000 | ORAL_TABLET | Freq: Four times a day (QID) | ORAL | Status: DC | PRN

## 2015-08-11 ENCOUNTER — Encounter: Payer: Self-pay | Admitting: Podiatry

## 2015-08-11 NOTE — Progress Notes (Signed)
  Subjective: 59 year old male presents the office today for continued treatment of left hallux verruca. Hit no problems after the last treatment of salinocaine. He does have the areas become somewhat painful. Denies any redness or drainage or any red streaks. No acute changes since last appointment and no other complaints at this time.  Objective: AAO 3, NAD Neurovascular status unchanged On the medial aspect of the left hallux there is an annular type appearing cauliflower lesion measuring 3 x 2 cm, although does not appear to be as much hyperkeratotic tissue overlying the area as previous. Upon debridement the underlying skin is intact and there is pinpoint bleeding and evidence of verruca. There is no surrounding erythema, ascending cellulitis, fluctuance, crepitus, malodor, drainage. No other open lesions or pre-ulcerative lesions. No other areas of tenderness to bilateral lower extremities. There is no pain with calf compression, swelling, warmth, erythema.  Assessment: 59 year old male left hallux verruca  Plan: -Treatment options discussed including all alternatives, risks, and complications -Lesion was sharply debrided without complications. The area was cleaned. A pad was placed on the area and salinocaine was applied followed by a bandage. Post procedure care discussed the patient. Monitor for any clinical signs or symptoms of infection and directed to call the office immediately should any occur or go to the ER. -Follow-up 2 weeks for repeat treatment or sooner if any problems arise. In the meantime, encouraged to call the office with any questions, concerns, change in symptoms.   Celesta Gentile, DPM

## 2015-08-15 ENCOUNTER — Ambulatory Visit (INDEPENDENT_AMBULATORY_CARE_PROVIDER_SITE_OTHER): Payer: Medicare Other | Admitting: Podiatry

## 2015-08-15 ENCOUNTER — Encounter: Payer: Self-pay | Admitting: Podiatry

## 2015-08-15 VITALS — BP 142/87 | HR 92 | Resp 14

## 2015-08-15 DIAGNOSIS — B079 Viral wart, unspecified: Secondary | ICD-10-CM

## 2015-08-15 DIAGNOSIS — M79676 Pain in unspecified toe(s): Secondary | ICD-10-CM

## 2015-08-15 DIAGNOSIS — B078 Other viral warts: Secondary | ICD-10-CM

## 2015-08-18 ENCOUNTER — Encounter: Payer: Self-pay | Admitting: Podiatry

## 2015-08-18 NOTE — Progress Notes (Signed)
  Subjective: 59 year old male presents the office today for continued treatment of left hallux verruca. He has had problems after the last treatment of salinocaine. He states the pain has improved since last appointment.. Denies any redness or drainage or any red streaks. No acute changes since last appointment and no other complaints at this time.  Objective: AAO 3, NAD Neurovascular status unchanged On the medial aspect of the left hallux there is an annular type appearing cauliflower lesion. There lesion is not as hypertrophic as previous and there does appear to have some good skin now around the lesion and it is slightly smaller than last appointment. Upon debridement the underlying skin is intact and there is pinpoint bleeding and evidence of verruca. There is no surrounding erythema, ascending cellulitis, fluctuance, crepitus, malodor, drainage. No other open lesions or pre-ulcerative lesions. No other areas of tenderness to bilateral lower extremities. There is no pain with calf compression, swelling, warmth, erythema.  Assessment: 59 year old male left hallux verruca  Plan: -Treatment options discussed including all alternatives, risks, and complications -Lesion was sharply debrided without complications. The area was cleaned. A pad was placed on the area and salinocaine was applied followed by a bandage. Post procedure care discussed the patient. Monitor for any clinical signs or symptoms of infection and directed to call the office immediately should any occur or go to the ER. -Discussed possible laser treatment should the lesion not resolve -Follow-up 2 weeks for repeat treatment or sooner if any problems arise. In the meantime, encouraged to call the office with any questions, concerns, change in symptoms.   Celesta Gentile, DPM

## 2015-08-19 ENCOUNTER — Encounter: Payer: Self-pay | Admitting: Podiatry

## 2015-08-25 ENCOUNTER — Encounter: Payer: Self-pay | Admitting: Podiatry

## 2015-08-25 ENCOUNTER — Ambulatory Visit (INDEPENDENT_AMBULATORY_CARE_PROVIDER_SITE_OTHER): Payer: Medicare Other | Admitting: Podiatry

## 2015-08-25 DIAGNOSIS — B070 Plantar wart: Secondary | ICD-10-CM

## 2015-08-25 DIAGNOSIS — B079 Viral wart, unspecified: Secondary | ICD-10-CM

## 2015-08-25 DIAGNOSIS — B078 Other viral warts: Secondary | ICD-10-CM

## 2015-08-25 MED ORDER — HYDROCODONE-ACETAMINOPHEN 5-325 MG PO TABS: 1.0000 | ORAL_TABLET | Freq: Four times a day (QID) | ORAL | Status: DC | PRN

## 2015-08-25 NOTE — Patient Instructions (Signed)
Monitor for any clinical signs or symptoms of infection and directed to call the office immediately should any occur or go to the ER.  

## 2015-08-25 NOTE — Progress Notes (Signed)
  Subjective: 59 year old male presents the office today for continued treatment of left hallux verruca. He states the area becomes somewhat painful particular pressure. He states that the Vicodin as prescribed last appointment was dropped in the sink which is full of water and has had no pain medication. He denies any swelling redness around the lesion and denies any drainage. No other complaints at this time in no acute changes. He denies any systemic complaints such as fevers, chills, nausea, vomiting. No calf pain, chest pain, shortness of breath.  Objective: AAO 3, NAD Neurovascular status unchanged On the medial aspect of the left hallux there is an annular type appearing cauliflower lesion. The lesion does not appear to be as hypertrophic as it has previously. Also appears to be somewhat smaller and there is more good tissue and the lesion compared to what it has been previously. There is mild tailors palpation around the area. Is no swelling erythema, ascending cellulitis, drainage/purulence. No other lesions identified bilaterally. There is no pain with calf compression, swelling, warmth, erythema.  Assessment: 59 year old male left hallux verruca  Plan: -Treatment options discussed including all alternatives, risks, and complications -Lesion was sharply debrided without complications. The area was cleaned. A pad was placed on the area and salinocaine was applied followed by a bandage. Post procedure care discussed the patient. Monitor for any clinical signs or symptoms of infection and directed to call the office immediately should any occur or go to the ER. -Discussed possible laser treatment should the lesion not resolve -Follow-up 2 weeks for repeat treatment or sooner if any problems arise. In the meantime, encouraged to call the office with any questions, concerns, change in symptoms.   Celesta Gentile, DPM

## 2015-09-01 ENCOUNTER — Ambulatory Visit: Payer: Medicare Other | Admitting: Podiatry

## 2015-09-08 ENCOUNTER — Ambulatory Visit (INDEPENDENT_AMBULATORY_CARE_PROVIDER_SITE_OTHER): Payer: Medicare Other | Admitting: Podiatry

## 2015-09-08 ENCOUNTER — Encounter: Payer: Self-pay | Admitting: Podiatry

## 2015-09-08 VITALS — BP 164/105 | HR 83 | Resp 18

## 2015-09-08 DIAGNOSIS — B078 Other viral warts: Secondary | ICD-10-CM

## 2015-09-08 DIAGNOSIS — L97521 Non-pressure chronic ulcer of other part of left foot limited to breakdown of skin: Secondary | ICD-10-CM

## 2015-09-08 DIAGNOSIS — B079 Viral wart, unspecified: Secondary | ICD-10-CM

## 2015-09-09 ENCOUNTER — Encounter: Payer: Self-pay | Admitting: Podiatry

## 2015-09-09 NOTE — Progress Notes (Signed)
Patient ID: Festus Pursel, male   DOB: 1956-06-19, 59 y.o.   MRN: [id number]  Subjective: 59 year old male presents the office in about evaluation of left hallux verruca. He states the area has become somewhat painful he has had some bloody drainage but denies any pus. Denies any swelling redness or red streaks. He's been applying Neosporin over the area and a bandage. Denies any systemic complaints as fevers, chills, nausea, vomiting. They calf pain, chest pain, SOB.   Objective: AAO 3, NAD Neurovascular status unchanged On the medial aspect of the hallux there continues to be hyperkeratotic cauliflower-type lesion. Lesion does not appear to be as figured it was previously. There does appear to be some dried blood underneath the hyperkeratotic tissue. Upon debridement underlying skin is intact around the periphery however on central aspect there is a superficial wound present. Small little bloody drainage was expressed. There is no purulence. There is no surrounding erythema, ascending cellulitis, fluctuance, crepitus, malodor, purulence. No other open lesions or pre-ulcerative lesions. There is no pain with calf compression, swelling, warmth, erythema.  Assessment: 59 year old male left hallux verruca/ulceration  Plan: -Treatment options discussed including all alternatives, risks, and complications -Lesion sharply debrided without consultation sterile reveals small underlying ulceration. The area was cleaned. Antibiotic ointment was placed followed by dry sterile dressing. Continue daily dressing changes. We will hold off on any salinocaine or treatment for a wart at this time.  -Rx Vicodin -Monitor for any clinical signs or symptoms of infection and directed to call the office immediately should any occur or go to the ER. -Follow-up in 2 weeks or sooner if any problems arise. In the meantime, encouraged to call the office with any questions, concerns, change in symptoms.   Celesta Gentile,  DPM

## 2015-09-23 ENCOUNTER — Encounter: Payer: Self-pay | Admitting: Podiatry

## 2015-09-23 ENCOUNTER — Ambulatory Visit (INDEPENDENT_AMBULATORY_CARE_PROVIDER_SITE_OTHER): Payer: Medicare Other | Admitting: Podiatry

## 2015-09-23 VITALS — BP 161/101 | HR 102 | Resp 18

## 2015-09-23 DIAGNOSIS — B070 Plantar wart: Secondary | ICD-10-CM

## 2015-09-23 DIAGNOSIS — B079 Viral wart, unspecified: Secondary | ICD-10-CM

## 2015-09-23 DIAGNOSIS — B078 Other viral warts: Secondary | ICD-10-CM

## 2015-09-23 MED ORDER — HYDROCODONE-ACETAMINOPHEN 5-325 MG PO TABS: 1.0000 | ORAL_TABLET | Freq: Four times a day (QID) | ORAL | Status: DC | PRN

## 2015-09-24 ENCOUNTER — Encounter: Payer: Self-pay | Admitting: Podiatry

## 2015-09-24 NOTE — Progress Notes (Signed)
Patient ID: [name], male   DOB: 16-Jan-1956, 59 y.o.   MRN: [id number]  Subjective: 59 year old male presents the office in about evaluation of left hallux verruca. He states the area continues to become painful as he is on his feet quite a bit as it rubs inside of his shoe. He denies any surrounding redness or red streaks. He's been continuing to soaking his foot in Epson salt soaks cover with Neosporin and a bandage. He has a states occasional phantom pain to his right big toe was previously amputated. He also has neuropathy for which he has increased his gabapentin which seems to help. He has no other complaints at this time in no acute changes. He denies any systemic complaints including fevers, chills, nausea, vomiting. No calf pain, chest pain, shortness of breath.  Objective: AAO 3, NAD Neurovascular status unchanged On the medial aspect of the hallux there continues to be hyperkeratotic cauliflower-type lesion,  Although does not appear to be as hypertrophic as previous.  The lesion measures proximal P2 0.4 x 2 cm. Upon debridement there is pinpoint bleeding and evidence of verruca. There is no underlying ulceration , drainage or other signs of infection. There is mild tenderness directly overlying this area prior to debridement. There are no other open lesions or pre-ulcerative lesions. Previous amputation of the right hallux. No pain with calf compression, swelling, warmth, erythema.  Assessment: 59 year old male left hallux verruca  Plan: -Treatment options discussed including all alternatives, risks, and complications -Lesion sharply debrided without  Complication. The area was cleaned. A pad was placed around the lesion followed by salinocaine and a bandage.  Procedure instructions were discussed the patient.Monitor for any clinical signs or symptoms of infection and directed to call the office immediately should any occur or go to the ER. Return to the surgical shoe. Vicodin  refilled  -Follow-up in 3 weeks or sooner if any problems arise. In the meantime, encouraged to call the office with any questions, concerns, change in symptoms.   Celesta Gentile, DPM

## 2015-10-06 DIAGNOSIS — E784 Other hyperlipidemia: Secondary | ICD-10-CM | POA: Diagnosis not present

## 2015-10-06 DIAGNOSIS — I2510 Atherosclerotic heart disease of native coronary artery without angina pectoris: Secondary | ICD-10-CM | POA: Diagnosis not present

## 2015-10-06 DIAGNOSIS — Z23 Encounter for immunization: Secondary | ICD-10-CM | POA: Diagnosis not present

## 2015-10-06 DIAGNOSIS — F17200 Nicotine dependence, unspecified, uncomplicated: Secondary | ICD-10-CM | POA: Diagnosis not present

## 2015-10-06 DIAGNOSIS — Z6841 Body Mass Index (BMI) 40.0 and over, adult: Secondary | ICD-10-CM | POA: Diagnosis not present

## 2015-10-06 DIAGNOSIS — G629 Polyneuropathy, unspecified: Secondary | ICD-10-CM | POA: Diagnosis not present

## 2015-10-06 DIAGNOSIS — N390 Urinary tract infection, site not specified: Secondary | ICD-10-CM | POA: Diagnosis not present

## 2015-10-06 DIAGNOSIS — R809 Proteinuria, unspecified: Secondary | ICD-10-CM | POA: Diagnosis not present

## 2015-10-06 DIAGNOSIS — R062 Wheezing: Secondary | ICD-10-CM | POA: Diagnosis not present

## 2015-10-14 ENCOUNTER — Encounter: Payer: Self-pay | Admitting: Podiatry

## 2015-10-14 ENCOUNTER — Ambulatory Visit (INDEPENDENT_AMBULATORY_CARE_PROVIDER_SITE_OTHER): Payer: Medicare Other | Admitting: Podiatry

## 2015-10-14 DIAGNOSIS — B079 Viral wart, unspecified: Secondary | ICD-10-CM | POA: Diagnosis not present

## 2015-10-14 DIAGNOSIS — B078 Other viral warts: Secondary | ICD-10-CM

## 2015-10-14 MED ORDER — HYDROCODONE-ACETAMINOPHEN 5-325 MG PO TABS: 1.0000 | ORAL_TABLET | Freq: Four times a day (QID) | ORAL | Status: DC | PRN

## 2015-10-14 NOTE — Progress Notes (Signed)
Subjective:     Patient ID: [name], male   DOB: December 27, 1955, 59 y.o.   MRN: [id number]  HPI this patient returns to the office for an evaluation of a large verrucous lesion on his left hallux. He states the area continues to be painful as he walks. He states that there is no infection or drainage from the site. He has been soaking his foot at home and then bandaging his toe. This patient states there is continued pain noted from the skin lesion on his left big toe. This patient states a biopsy was performed which came back as a wart. This patient was initially seen by Dr. Jacqualyn Posey on the date and Dr. Jacqualyn Posey was required to go to the hospital for emergency treatment. Therefore, Dr. Prudence Davidson provided the treatment for this patient on this date.     Review of Systems     Objective:   Physical Exam GENERAL APPEARANCE: Alert, conversant. Appropriately groomed. No acute distress.  VASCULAR: Pedal pulses palpable at  [facility] and PT bilateral.  Capillary refill time is immediate to all digits,  Normal temperature gradient.  NEUROLOGIC: sensation is absent to 5.07 monofilament at 5/5 sites bilateral.  Light touch is intact bilateral, Muscle strength normal.  MUSCULOSKELETAL: acceptable muscle strength, tone and stability bilateral.  Intrinsic muscluature intact bilateral.  Rectus appearance of foot and digits noted bilateral.   DERMATOLOGIC: skin color, texture, and turgor are within normal limits.  , no interdigital maceration noted.  No open lesions present.  There is a large cauliflower lesion on plantar aspect of left hallux. verrucous lesion is covered with hyperkeratotic tissue. No evidence of redness or swelling or infection.  .  Nails  Thick disfigured discolored nails both feet.      Assessment:     Benign skin lesion   Verruca left hallux.     Plan:     ROV.   The verrucous tissue was debrided .   The lesion was bandaged with neosporin/DSD.  Prior to leaving for treatment, Dr. Jacqualyn Posey said  he would prescribe pain medicine. Told patient there would be no problem returning to the cam walker if he felt comfortable wearing the walker. He was told to follow up with Dr. Jacqualyn Posey in 2 weeks.  Gardiner Barefoot DPM

## 2015-10-15 ENCOUNTER — Telehealth: Payer: Self-pay | Admitting: *Deleted

## 2015-10-15 NOTE — Telephone Encounter (Signed)
I spoke with pt and he said he went to [facility] for lab and evaluation after pt - doctor stick 10/14/2015.

## 2015-10-28 ENCOUNTER — Encounter: Payer: Self-pay | Admitting: Podiatry

## 2015-10-28 ENCOUNTER — Ambulatory Visit (INDEPENDENT_AMBULATORY_CARE_PROVIDER_SITE_OTHER): Payer: Medicare Other | Admitting: Podiatry

## 2015-10-28 VITALS — BP 157/90 | HR 91 | Resp 12

## 2015-10-28 DIAGNOSIS — B079 Viral wart, unspecified: Secondary | ICD-10-CM | POA: Diagnosis not present

## 2015-10-28 DIAGNOSIS — B078 Other viral warts: Secondary | ICD-10-CM

## 2015-10-28 DIAGNOSIS — L97511 Non-pressure chronic ulcer of other part of right foot limited to breakdown of skin: Secondary | ICD-10-CM

## 2015-10-28 MED ORDER — HYDROCODONE-ACETAMINOPHEN 5-325 MG PO TABS: 1.0000 | ORAL_TABLET | Freq: Four times a day (QID) | ORAL | Status: DC | PRN

## 2015-10-29 ENCOUNTER — Encounter: Payer: Self-pay | Admitting: Podiatry

## 2015-10-29 NOTE — Progress Notes (Signed)
Patient ID: [name], male   DOB: Jun 21, 1956, 59 y.o.   MRN: [id number]  Subjective: 59 year old male presents the office in about evaluation of left hallux verruca. He states the area is getting better. It does continue to be sore and he does take pain medication at night. He does have a new sore present today on his right 5th toe. He believes that he had a callus form and started to break down the skin. Denies any surrounding redness or drainage from any of the sites. Denies any swelling to the foot. He denies any systemic complaints including fevers, chills, nausea, vomiting. No calf pain, chest pain, shortness of breath.  Objective: AAO 3, NAD Neurovascular status unchanged On the medial aspect of the hallux there continues to be hyperkeratotic cauliflower-type lesion,  Although significantly not as hypertrophic. The lesion does appear to be somewhat smaller. Upon debridement there is pinpoint bleeding and evidence of verruca. There is no underlying ulceration , drainage or other signs of infection. There is mild tenderness directly overlying this area prior to debridement.  On the dorsal lateral aspect of the right fifth is a hyperkeratotic lesion. Upon debridement is a small, superficial, granular wound base measuring 0.4 x 0.4 cm and superficial. There is adductovarus the toe. There is no swelling or edema, erythema, eighth knee cellulitis, fluxions, crepitus, malodor. No signs of infection. There are no other open lesions or pre-ulcerative lesions. Previous amputation of the right hallux. No pain with calf compression, swelling, warmth, erythema.  Assessment: 59 year old male left hallux verruca; right fifth toe ulceration  Plan: -Treatment options discussed including all alternatives, risks, and complications -Lesion sharply debrided without complication. The area was cleaned. A pad was placed around the lesion followed by salinocaine and a bandage.  Procedure instructions were  discussed the patient. -Hyperkeratotic lesion right fifth toe sharply debrided without complication/going to reveal underlying ulceration. The area was cleaned. Antibiotic ointment was placed followed by dry sterile dressing. Continue daily dressing changes.  -Vicodin refilled -Monitor for any clinical signs or symptoms of infection and directed to call the office immediately should any occur or go to the ER. -Follow-up in 3 weeks or sooner if any problems arise. In the meantime, encouraged to call the office with any questions, concerns, change in symptoms.   Celesta Gentile, DPM

## 2015-11-11 ENCOUNTER — Encounter: Payer: Self-pay | Admitting: Podiatry

## 2015-11-11 ENCOUNTER — Ambulatory Visit (INDEPENDENT_AMBULATORY_CARE_PROVIDER_SITE_OTHER): Payer: Medicare Other | Admitting: Podiatry

## 2015-11-11 VITALS — BP 133/89 | HR 85 | Resp 18

## 2015-11-11 DIAGNOSIS — B079 Viral wart, unspecified: Secondary | ICD-10-CM

## 2015-11-11 DIAGNOSIS — B078 Other viral warts: Secondary | ICD-10-CM

## 2015-11-11 DIAGNOSIS — L84 Corns and callosities: Secondary | ICD-10-CM | POA: Diagnosis not present

## 2015-11-11 MED ORDER — HYDROCODONE-ACETAMINOPHEN 5-325 MG PO TABS: 1.0000 | ORAL_TABLET | Freq: Four times a day (QID) | ORAL | Status: DC | PRN

## 2015-11-13 ENCOUNTER — Encounter: Payer: Self-pay | Admitting: Podiatry

## 2015-11-13 NOTE — Progress Notes (Signed)
Patient ID: [name], male   DOB: Aug 21, 1956, 59 y.o.   MRN: [id number]  Subjective: 59 year old male presents the office in about evaluation of left hallux verruca. He believes of the area becomes somewhat bigger compared last appointment. Denies any surrounding redness or red streaks. No drainage or pus. Condition of pain overlying the area for which he takes Vicodin at night. Denies any swelling to the foot. He denies any systemic complaints including fevers, chills, nausea, vomiting. No calf pain, chest pain, shortness of breath.  Objective: AAO 3, NAD Neurovascular status unchanged On the medial aspect of the hallux there continues to be hyperkeratotic cauliflower-type lesion. The lesion doesn't be somewhat bigger compared to last appointment however this is from some epidermal lysis. After debridement the areas about the same size which currently measures 3 x 2.5 cm. After debridement there was no underlying ulceration, drainage or other signs of infection. There was some dried blood underneath the callus. There was pinpoint bleeding or evidence of verruca. The lesion. No other areas of tenderness to bilateral lower extremities. Previous amputation of the right hallux. No pain with calf compression, swelling, warmth, erythema.  Assessment: 59 year old male left hallux verruca  Plan: -Treatment options discussed including all alternatives, risks, and complications -Lesion sharply debrided without complication however some bleeding occurred. The area was cleaned. Antibiotic ointment and a bandage was placed. Continue with Epsom salts soaks as well as dressing changes daily. -Currently no wound on the right fifth toe have her monitor for any recurrence. -Vicodin refilled -Monitor for any clinical signs or symptoms of infection and directed to call the office immediately should any occur or go to the ER. -Follow-up in 3 weeks or sooner if any problems arise. In the meantime, encouraged to  call the office with any questions, concerns, change in symptoms.   Celesta Gentile, DPM

## 2015-12-02 ENCOUNTER — Encounter: Payer: Self-pay | Admitting: Podiatry

## 2015-12-02 ENCOUNTER — Telehealth: Payer: Self-pay | Admitting: *Deleted

## 2015-12-02 ENCOUNTER — Ambulatory Visit (INDEPENDENT_AMBULATORY_CARE_PROVIDER_SITE_OTHER): Payer: Medicare Other | Admitting: Podiatry

## 2015-12-02 VITALS — BP 152/87 | HR 98 | Resp 18

## 2015-12-02 DIAGNOSIS — B079 Viral wart, unspecified: Secondary | ICD-10-CM

## 2015-12-02 DIAGNOSIS — B078 Other viral warts: Secondary | ICD-10-CM

## 2015-12-02 DIAGNOSIS — B070 Plantar wart: Secondary | ICD-10-CM

## 2015-12-02 MED ORDER — HYDROCODONE-ACETAMINOPHEN 5-325 MG PO TABS: 1.0000 | ORAL_TABLET | Freq: Four times a day (QID) | ORAL | Status: DC | PRN

## 2015-12-02 NOTE — Telephone Encounter (Signed)
Dr. Paulla Dolly requested Hydrocodone to be ordered.

## 2015-12-02 NOTE — Progress Notes (Signed)
Patient ID: [name], male   DOB: Aug 17, 1956, 60 y.o.   MRN: [id number]  Subjective: 60 year old male presents the office in about evaluation of left hallux verruca. He believes that the area has gotten smaller although discontinued become painful protective pressure. Denies any redness or drainage. No pus. No malodor. He does take Vicodin at night mostly but does take it during the day if needed. This does help the pain. Denies any swelling to the foot. He denies any systemic complaints including fevers, chills, nausea, vomiting. No calf pain, chest pain, shortness of breath.  Objective: AAO 3, NAD Neurovascular status unchanged On the medial aspect of the hallux there continues to be hyperkeratotic cauliflower-type lesion. Lesion does not appear to be hypertrophic as compared to previous. Upon debridement there was pinpoint bleeding evidence of verruca. Ascending edema, erythema, drainage or pus. No signs of infection. No other areas of tenderness to bilateral lower extremities. Previous amputation of the right hallux. No pain with calf compression, swelling, warmth, erythema.  Assessment: 60 year old male left hallux verruca  Plan: -Treatment options discussed including all alternatives, risks, and complications -Lesion sharply debrided without complication however some minor pinpoint bleeding occurred due to the verruca. The area was cleaned. Pad was placed on the area followed by salicylic acid. Post procedure injections were discussed the patient. -His insurance is changing he will try the Efudex cream.  -Refilled vicodin -Monitor for any clinical signs or symptoms of infection and directed to call the office immediately should any occur or go to the ER. -Follow-up in 3 weeks or sooner if any problems arise. In the meantime, encouraged to call the office with any questions, concerns, change in symptoms.   Celesta Gentile, DPM

## 2015-12-18 ENCOUNTER — Telehealth: Payer: Self-pay | Admitting: Podiatry

## 2015-12-18 MED ORDER — HYDROCODONE-ACETAMINOPHEN 5-325 MG PO TABS: 1.0000 | ORAL_TABLET | Freq: Four times a day (QID) | ORAL | Status: DC | PRN

## 2015-12-18 NOTE — Telephone Encounter (Signed)
Pt called and was not sure if he left a message on the right voicemail. He needs a refill on his pain medication.

## 2015-12-18 NOTE — Telephone Encounter (Signed)
Pt states he still has pain in the foot where the wart is and clear drainage.  I told pt to continue the medication cream prescribed by Dr. Jacqualyn Posey as directed and he could put an antibiotic cream surrounding the area to sooth, and to keep next weeks appt.  Dr. Paulla Dolly refilled the Hydrocodone 5/325mg  as previously.  Pt is to pick up in the [place] office.

## 2015-12-23 ENCOUNTER — Ambulatory Visit (INDEPENDENT_AMBULATORY_CARE_PROVIDER_SITE_OTHER): Payer: Medicare Other | Admitting: Podiatry

## 2015-12-23 ENCOUNTER — Encounter: Payer: Self-pay | Admitting: Podiatry

## 2015-12-23 VITALS — BP 61/43 | HR 79 | Resp 16

## 2015-12-23 DIAGNOSIS — L84 Corns and callosities: Secondary | ICD-10-CM

## 2015-12-23 DIAGNOSIS — B079 Viral wart, unspecified: Secondary | ICD-10-CM

## 2015-12-23 DIAGNOSIS — B078 Other viral warts: Secondary | ICD-10-CM

## 2015-12-23 MED ORDER — CEPHALEXIN 500 MG PO CAPS: 500.0000 mg | ORAL_CAPSULE | Freq: Three times a day (TID) | ORAL | Status: DC

## 2015-12-23 MED ORDER — HYDROCODONE-ACETAMINOPHEN 5-325 MG PO TABS: 1.0000 | ORAL_TABLET | Freq: Four times a day (QID) | ORAL | Status: DC | PRN

## 2015-12-28 ENCOUNTER — Encounter: Payer: Self-pay | Admitting: Podiatry

## 2015-12-28 DIAGNOSIS — B079 Viral wart, unspecified: Secondary | ICD-10-CM | POA: Insufficient documentation

## 2015-12-28 DIAGNOSIS — L84 Corns and callosities: Secondary | ICD-10-CM | POA: Insufficient documentation

## 2015-12-28 DIAGNOSIS — B078 Other viral warts: Secondary | ICD-10-CM | POA: Insufficient documentation

## 2015-12-28 NOTE — Progress Notes (Signed)
Patient ID: [name], male   DOB: 1956-01-01, 60 y.o.   MRN: [id number]  Subjective: 60 year old male presents the office in about evaluation of left hallux verruca. He believes that since starting the Efudex Cream the lesion has decreased in size. He has had no side effects of the area. He had some clear drainage previously to that resolves. The area doesn't do become painful with weightbearing or pressure. Denies any redness or red streaks. No other signs of infection. . Denies any swelling to the foot. He denies any systemic complaints including fevers, chills, nausea, vomiting. No calf pain, chest pain, shortness of breath.  Objective: AAO 3, NAD Neurovascular status unchanged On the medial aspect of the hallux there continues to be hyperkeratotic cauliflower-type lesion. The overall circumference of the wound does appear to be decreased. Surrounding the area is skin discoloration/scar tissue from where the lesion was previously. There is no surrounding erythema, ascending cellulitis, fluctuance, crepitus, malodor, drainage or purulence.No signs of infection. No other areas of tenderness to bilateral lower extremities. Previous amputation of the right hallux. No other open lesions or pre-ulcerative lesions.  No pain with calf compression, swelling, warmth, erythema.  Assessment: 60 year old male left hallux verruca  Plan: -Treatment options discussed including all alternatives, risks, and complications -Lesion sharply debrided without complication. Continue Efudex cream.  -Offloading pads -Refilled vicodin -Monitor for any clinical signs or symptoms of infection and directed to call the office immediately should any occur or go to the ER. -Follow-up in 3 weeks or sooner if any problems arise. In the meantime, encouraged to call the office with any questions, concerns, change in symptoms.   Celesta Gentile, DPM

## 2016-01-06 ENCOUNTER — Encounter: Payer: Self-pay | Admitting: Podiatry

## 2016-01-06 ENCOUNTER — Ambulatory Visit (INDEPENDENT_AMBULATORY_CARE_PROVIDER_SITE_OTHER): Payer: Medicare Other | Admitting: Podiatry

## 2016-01-06 DIAGNOSIS — B079 Viral wart, unspecified: Secondary | ICD-10-CM

## 2016-01-06 DIAGNOSIS — B078 Other viral warts: Secondary | ICD-10-CM

## 2016-01-06 DIAGNOSIS — L84 Corns and callosities: Secondary | ICD-10-CM

## 2016-01-06 MED ORDER — HYDROCODONE-ACETAMINOPHEN 5-325 MG PO TABS: 1.0000 | ORAL_TABLET | Freq: Four times a day (QID) | ORAL | Status: DC | PRN

## 2016-01-08 NOTE — Progress Notes (Signed)
Patient ID: [name], male   DOB: [birth year], 60 y.o.   MRN: [id number]  Subjective: 60 year old male presents the office in about evaluation of left hallux verruca.  He continues to that he feels that he is making improvements in the lesion ever since starting the at the Efudex cream. Denies any redness or drainage. Alvester Chou does still become painful at times which is relieved by Vicodin.. No signs of infection. Denies any swelling to the foot. He denies any systemic complaints including fevers, chills, nausea, vomiting. No calf pain, chest pain, shortness of breath.  Objective: AAO 3, NAD Neurovascular status unchanged On the medial aspect of the hallux there continues to be hyperkeratotic cauliflower-type lesion. The overall circumference of the wound does appear to be decreased.  Protective measures 2.5 x 2 cm.Surrounding the area is skin discoloration/scar tissue from where the lesion was previously. There is no surrounding erythema, ascending cellulitis, fluctuance, crepitus, malodor, drainage or purulence. No signs of infection.  hyperkeratotic lesion on the right fifth toe. Upon debridement no underlying ulceration, drainage or other signs of infection. No other areas of tenderness to bilateral lower extremities. Previous amputation of the right hallux. No other open lesions or pre-ulcerative lesions.  No pain with calf compression, swelling, warmth, erythema.  Assessment: 60 year old male left hallux verruca;  Pre-ulcerative lesion right fifth toe  Plan: -Treatment options discussed including all alternatives, risks, and complications -Lesion sharply debrided without complication. Continue Efudex cream.  -Right fifth toe lesion was debrided without complications or bleeding. -Offloading pads -Pain medication as needed.  -Monitor for any clinical signs or symptoms of infection and directed to call the office immediately should any occur or go to the ER. -Follow-up in 3 weeks or  sooner if any problems arise. In the meantime, encouraged to call the office with any questions, concerns, change in symptoms.   Celesta Gentile, DPM

## 2016-01-27 ENCOUNTER — Encounter: Payer: Self-pay | Admitting: Podiatry

## 2016-01-27 ENCOUNTER — Ambulatory Visit (INDEPENDENT_AMBULATORY_CARE_PROVIDER_SITE_OTHER): Payer: Medicare Other | Admitting: Podiatry

## 2016-01-27 DIAGNOSIS — L89891 Pressure ulcer of other site, stage 1: Secondary | ICD-10-CM | POA: Diagnosis not present

## 2016-01-27 DIAGNOSIS — L97521 Non-pressure chronic ulcer of other part of left foot limited to breakdown of skin: Secondary | ICD-10-CM

## 2016-01-27 DIAGNOSIS — L84 Corns and callosities: Secondary | ICD-10-CM

## 2016-01-27 MED ORDER — CURASOL WOUND DRESSING EX GEL: 1.0000 "application " | Freq: Every day | CUTANEOUS | Status: DC

## 2016-01-27 MED ORDER — HYDROCODONE-ACETAMINOPHEN 5-325 MG PO TABS: 1.0000 | ORAL_TABLET | Freq: Four times a day (QID) | ORAL | Status: DC | PRN

## 2016-01-28 NOTE — Progress Notes (Signed)
Patient ID: [name], male   DOB: 1956/06/23, 60 y.o.   MRN: [id number]  Subjective: 60 year old male presents the office in about evaluation of left hallux verruca.  He continues to that he feels that he is making improvements in the lesion ever since starting the at the Efudex cream but it is still painful to walk and put pressure on the area.No surrounding redness or drainage. No red streaks. No other complaints at this time and no acute changes.  Objective: AAO 3, NAD Neurovascular status unchanged On the medial aspect of the hallux there continues to be hyperkeratotic cauliflower-type lesion. The overall circumference of the wound does appear to be decreased.  Lesion measures 2.5 x 2 cm. Surrounding the area is skin discoloration/scar tissue from where the lesion was previously. Upon grandmother does appear to be underlying ulceration today which does not probe, undermining or tunneling. There is no fluctuance or crepitus. No clinical signs of infection at this time. There is no surrounding erythema, ascending cellulitis, fluctuance, crepitus, malodor, drainage or purulence.  No other areas of tenderness to bilateral lower extremities. Previous amputation of the right hallux. No other open lesions or pre-ulcerative lesions.  No pain with calf compression, swelling, warmth, erythema.  Assessment: 60 year old male left hallux verruca  Plan: -Treatment options discussed including all alternatives, risks, and complications -Lesion sharply debrided without complication. Will switch to gel wound dressing for now.  -Offloading pads -Pain medication as needed.  -Vicodin prn  -Monitor for any clinical signs or symptoms of infection and directed to call the office immediately should any occur or go to the ER. -Follow-up in 3 weeks or sooner if any problems arise. In the meantime, encouraged to call the office with any questions, concerns, change in symptoms.   Celesta Gentile, DPM

## 2016-02-16 ENCOUNTER — Ambulatory Visit (INDEPENDENT_AMBULATORY_CARE_PROVIDER_SITE_OTHER): Payer: Medicare Other | Admitting: Podiatry

## 2016-02-16 ENCOUNTER — Ambulatory Visit (INDEPENDENT_AMBULATORY_CARE_PROVIDER_SITE_OTHER): Payer: Medicare Other

## 2016-02-16 ENCOUNTER — Encounter: Payer: Self-pay | Admitting: Podiatry

## 2016-02-16 VITALS — BP 174/96 | HR 86 | Resp 18

## 2016-02-16 DIAGNOSIS — L97521 Non-pressure chronic ulcer of other part of left foot limited to breakdown of skin: Secondary | ICD-10-CM

## 2016-02-16 DIAGNOSIS — M79672 Pain in left foot: Secondary | ICD-10-CM | POA: Diagnosis not present

## 2016-02-16 DIAGNOSIS — L89891 Pressure ulcer of other site, stage 1: Secondary | ICD-10-CM

## 2016-02-16 MED ORDER — HYDROCODONE-ACETAMINOPHEN 5-325 MG PO TABS: 1.0000 | ORAL_TABLET | Freq: Four times a day (QID) | ORAL | Status: DC | PRN

## 2016-02-17 ENCOUNTER — Encounter: Payer: Self-pay | Admitting: Podiatry

## 2016-02-17 ENCOUNTER — Other Ambulatory Visit: Payer: Self-pay | Admitting: Acute Care

## 2016-02-17 DIAGNOSIS — F17210 Nicotine dependence, cigarettes, uncomplicated: Secondary | ICD-10-CM

## 2016-02-17 NOTE — Progress Notes (Signed)
Patient ID: [name], male   DOB: [birth year], 60 y.o.   MRN: [id number]  Subjective: 60 year old male presents the office in about evaluation of left hallux verruca/ulcer. I ordered a wound gel but he did not get it apparently. He has continued with the wart cream. He does continue to get pain to the area. No surrounding redness or drainage. No red streaks. No other complaints at this time and no acute changes.  Objective: AAO 3, NAD Neurovascular status unchanged On the medial aspect of the hallux there continues to be hyperkeratotic cauliflower-type lesion. Overall does not appear to be as large in circumference or his legs and what was previously although does continue. Upon debridement there is a central wound with some macerated tissue around the edges. There is no probing, undermining or tunneling. There is no surrounding erythema, ascending cellulitis, fluctuance, crepitus, malodor, drainage or pus. No other open lesions or pre-ulcerative lesions. Previous amputation of the right hallux. No other open lesions or pre-ulcerative lesions.  No pain with calf compression, swelling, warmth, erythema.  Assessment: 60 year old male left hallux ulceration  Plan: -Treatment options discussed including all alternatives, risks, and complications -X-rays were obtained and reviewed with the patient. There is chronic changes the distal phalanx however this does not appear to be acute. -Lesion sharply debrided without complication. Reordered wound gel for now. Area continues to be very thickened hyperkeratotic around the edges. We'll try the wound gel now. Continue daily dressing changes. -Offloading pads -Pain medication as needed.  -Vicodin prn  -Monitor for any clinical signs or symptoms of infection and directed to call the office immediately should any occur or go to the ER. -Follow-up in 3 weeks or sooner if any problems arise. In the meantime, encouraged to call the office with any  questions, concerns, change in symptoms.   Celesta Gentile, DPM

## 2016-02-24 ENCOUNTER — Telehealth: Payer: Self-pay | Admitting: *Deleted

## 2016-02-24 NOTE — Telephone Encounter (Addendum)
Pt states his medications have not arrived at his house.  02/25/2016-DrJacqualyn Posey ordered Curasol wound gel for left hallux ulcer 2.5cm x 2.0 cm x 0.2cm, low exudate. Faxed.  Informed pt of the order being place with [facility] today.

## 2016-02-24 NOTE — Telephone Encounter (Signed)
It was the wound gel dressing. I have completed this paper twice for him. Not sure where it has gone.

## 2016-02-26 ENCOUNTER — Telehealth: Payer: Self-pay | Admitting: *Deleted

## 2016-02-26 NOTE — Telephone Encounter (Signed)
[name] - [facility] states Curasol wound gel is no longer available, can they substitute Saf-gel.  I told her that would be fine and she said she will get it out to the pt asap.

## 2016-03-08 ENCOUNTER — Ambulatory Visit (INDEPENDENT_AMBULATORY_CARE_PROVIDER_SITE_OTHER): Payer: Medicare Other

## 2016-03-08 ENCOUNTER — Ambulatory Visit (INDEPENDENT_AMBULATORY_CARE_PROVIDER_SITE_OTHER): Payer: Medicare Other | Admitting: Podiatry

## 2016-03-08 ENCOUNTER — Encounter: Payer: Self-pay | Admitting: Podiatry

## 2016-03-08 VITALS — BP 166/105 | HR 86 | Resp 18

## 2016-03-08 DIAGNOSIS — L89891 Pressure ulcer of other site, stage 1: Secondary | ICD-10-CM | POA: Diagnosis not present

## 2016-03-08 DIAGNOSIS — L97509 Non-pressure chronic ulcer of other part of unspecified foot with unspecified severity: Secondary | ICD-10-CM | POA: Insufficient documentation

## 2016-03-08 DIAGNOSIS — L97521 Non-pressure chronic ulcer of other part of left foot limited to breakdown of skin: Secondary | ICD-10-CM

## 2016-03-08 DIAGNOSIS — R52 Pain, unspecified: Secondary | ICD-10-CM

## 2016-03-08 MED ORDER — HYDROCODONE-ACETAMINOPHEN 5-325 MG PO TABS: 1.0000 | ORAL_TABLET | Freq: Four times a day (QID) | ORAL | Status: DC | PRN

## 2016-03-08 NOTE — Progress Notes (Signed)
Patient ID: [name], male   DOB: Jan 10, 1956, 60 y.o.   MRN: [id number]  Subjective: 60 year old male presents the office in about evaluation of left hallux verruca/ulcer. He states that overall the area does appear to look better than it has. See. He's been using the wound gel which seems to help. He distal states the area throbs mostly night for pain. Denies any redness or red streaks. No other complaints at this time no acute changes since last appointment. Denies any systemic complaints such as fevers, chills, nausea, vomiting. No calf pain, chest pain, shortness of breath.  Objective: AAO 3, NAD Neurovascular status unchanged On the medial aspect of the hallux there continues to be hyperkeratotic cauliflower-type lesion. Upon appointment there is underlying ulceration measuring approximately 1.2 x 1 x .4. There is no probing to bone, undermining or tunneling. There is no swelling erythema, ascending cellulitis, fluctuance, crepitus, malodor, drainage or pus. No other open lesions or pre-ulcerative lesions identified this time. Previously amputation right hallux. No other open lesions or pre-ulcerative lesions.  No pain with calf compression, swelling, warmth, erythema.  Assessment: 60 year old male left hallux ulceration  Plan: -Treatment options discussed including all alternatives, risks, and complications -X-rays were obtained and reviewed with the patient. There is chronic changes the distal phalanx however this does not appear to be acute. No definitive evidence of cortical changes. -Lesion was debrided without complications to granular wound base. Continue with wound gel for now. Offloading pads/surgical shoe. -Monitor for any clinical signs or symptoms of infection and directed to call the office immediately should any occur or go to the ER. -Follow-up in 3 weeks or sooner if any problems arise. In the meantime, encouraged to call the office with any questions, concerns, change  in symptoms.   Celesta Gentile, DPM

## 2016-03-29 ENCOUNTER — Encounter: Payer: Self-pay | Admitting: Podiatry

## 2016-03-29 ENCOUNTER — Ambulatory Visit (INDEPENDENT_AMBULATORY_CARE_PROVIDER_SITE_OTHER): Payer: Medicare Other | Admitting: Podiatry

## 2016-03-29 VITALS — BP 152/94 | HR 88 | Resp 18

## 2016-03-29 DIAGNOSIS — G629 Polyneuropathy, unspecified: Secondary | ICD-10-CM

## 2016-03-29 DIAGNOSIS — B351 Tinea unguium: Secondary | ICD-10-CM | POA: Diagnosis not present

## 2016-03-29 DIAGNOSIS — L03032 Cellulitis of left toe: Secondary | ICD-10-CM

## 2016-03-29 DIAGNOSIS — L97521 Non-pressure chronic ulcer of other part of left foot limited to breakdown of skin: Secondary | ICD-10-CM

## 2016-03-29 DIAGNOSIS — M79672 Pain in left foot: Secondary | ICD-10-CM

## 2016-03-29 DIAGNOSIS — L84 Corns and callosities: Secondary | ICD-10-CM

## 2016-03-29 DIAGNOSIS — L89891 Pressure ulcer of other site, stage 1: Secondary | ICD-10-CM | POA: Diagnosis not present

## 2016-03-29 MED ORDER — PREGABALIN 150 MG PO CAPS: 150.0000 mg | ORAL_CAPSULE | Freq: Two times a day (BID) | ORAL | Status: DC

## 2016-03-29 MED ORDER — HYDROCODONE-ACETAMINOPHEN 5-325 MG PO TABS: 1.0000 | ORAL_TABLET | Freq: Four times a day (QID) | ORAL | Status: DC | PRN

## 2016-03-29 MED ORDER — CEPHALEXIN 500 MG PO CAPS: 500.0000 mg | ORAL_CAPSULE | Freq: Three times a day (TID) | ORAL | Status: DC

## 2016-03-29 NOTE — Progress Notes (Signed)
Patient ID: [name], male   DOB: 1956/03/03, 60 y.o.   MRN: [id number]  Subjective: 60 year old male presents the office in about evaluation of left hallux verruca/ulcer. The areas very painful still. He states the pain medicine does help. He has also tenderness blister form a second toe on the left foot as well as small amount of redness. No drainage or pus. No red streaks. He also states that his nails on the left side are thickened elongated and he cannot trim them himself. States that he has continue with gabapentin however this is not helping elected discuss further options. Denies any systemic complaints such as fevers, chills, nausea, vomiting. No calf pain, chest pain, shortness of breath.  Objective: AAO 3, NAD Neurovascular status unchanged On the medial aspect of the hallux there continues to be hyperkeratotic cauliflower-type lesion. Upon agreement there is no significant underlying ulceration. There is no drainage or pus. Small amount of bleeding occurred. No significant redness or red streaks. On the dorsal aspect left second toe small superficial drained appearing bulla 2. Mild erythema around the dorsal aspect of the left second toe. No before meals cellulitis. There is mild edema to the toe. No before meals synovitis. No fluctuance or crepitus. Nails on the left foot are hypertrophic, dystrophic, brittle, discolored, elongated 5. He did not wish to get shoes and socks on the right side. No other open lesions or pre-ulcerative lesions identified this time. Previously amputation right hallux. No other open lesions or pre-ulcerative lesions.  No pain with calf compression, swelling, warmth, erythema.  Assessment: 60 year old male left hallux ulceration, left second toe cellulitis, left foot symptomatic onychomycosis, neuropathy  Plan: -Treatment options discussed including all alternatives, risks, and complications -Wound/verruca left hallux debrided without, occasions or  bleeding. Appears to be somewhat improved however does remain. Refill pain medicine his request. -Will start Lyrica due to neuropathy as her gabapentin is helping. Discussed with him how to transition the Lyrica. Monitor for side effects and I discussed these with him. -Nails left foot debrided without complications or bleeding. -Keflex her left second toe cellulitis. -Monitor for any clinical signs or symptoms of infection and directed to call the office immediately should any occur or go to the ER. -Follow-up in 2 weeks or sooner if any problems arise. In the meantime, encouraged to call the office with any questions, concerns, change in symptoms.   Celesta Gentile, DPM

## 2016-04-12 ENCOUNTER — Encounter: Payer: Self-pay | Admitting: Podiatry

## 2016-04-12 ENCOUNTER — Ambulatory Visit (INDEPENDENT_AMBULATORY_CARE_PROVIDER_SITE_OTHER): Payer: Medicare Other | Admitting: Podiatry

## 2016-04-12 VITALS — BP 178/97 | HR 90 | Resp 18

## 2016-04-12 DIAGNOSIS — L97521 Non-pressure chronic ulcer of other part of left foot limited to breakdown of skin: Secondary | ICD-10-CM

## 2016-04-12 DIAGNOSIS — L89891 Pressure ulcer of other site, stage 1: Secondary | ICD-10-CM | POA: Diagnosis not present

## 2016-04-12 MED ORDER — HYDROCODONE-ACETAMINOPHEN 5-325 MG PO TABS: 1.0000 | ORAL_TABLET | Freq: Four times a day (QID) | ORAL | Status: DC | PRN

## 2016-04-12 NOTE — Progress Notes (Signed)
Patient ID: [name], male   DOB: [birth year], 60 y.o.   MRN: [id number]  Subjective: 60 year old male presents the office in about evaluation of left hallux verruca/ulcer. He also states that since that in the Lyrica his pain is significant improvement in the neuropathy may combines with hydrocodeine he has no pain to his feet. He has been using the wound gel along the lesions left big toe although does remain painful as it grows. He reports no drainage, redness, swelling. Denies any systemic complaints such as fevers, chills, nausea, vomiting. No calf pain, chest pain, shortness of breath.  Objective: AAO 3, NAD Neurovascular status unchanged On the medial aspect of the hallux there continues to be hyperkeratotic cauliflower-type lesion. Upon agreement there is  an underlying granular ulceration measuring 1 x 0.3 cm. Periwound is hyperkeratotic. There is no probing, undermining or tunneling. There is no drainage or pus. No swelling or redness to the toe. No ascending cellulitis. No flexors or crepitus. No malodor. There are 2 scabs the dorsal aspect of the second toe on the left foot. No swelling redness or drainage. No other open lesions or pre-ulcerative lesions identified this time. Previously amputation right hallux. No other open lesions or pre-ulcerative lesions.  No pain with calf compression, swelling, warmth, erythema.  Assessment: 60 year old male left hallux ulceration, left second toe resolving wounds with resolved cellulitis   Plan: -Treatment options discussed including all alternatives, risks, and complications -Wound/lesion left hallux debrided. Continue wound gel dressing changes daily for now. Monitor for signs or symptoms of infection. Continue to monitor left second toe. It did worsen to call the office. Office Follow-up on her own. -Continue Lyrica. Refilled hydrocodone. Discussed with him that if he needs long-term likelihood of pain management. -Out of a second  opinion on the wound. I'll have him see Dr. Cannon Kettle next appointment and I will be more than happy to see him after that.  Celesta Gentile, DPM

## 2016-04-19 ENCOUNTER — Ambulatory Visit
Admission: RE | Admit: 2016-04-19 | Discharge: 2016-04-19 | Disposition: A | Discharge status: Home / Self Care | Payer: Medicare Other | Source: Ambulatory Visit | Attending: Acute Care | Admitting: Acute Care

## 2016-04-19 ENCOUNTER — Ambulatory Visit: Payer: Medicare Other

## 2016-04-19 DIAGNOSIS — F17210 Nicotine dependence, cigarettes, uncomplicated: Secondary | ICD-10-CM

## 2016-04-29 ENCOUNTER — Encounter: Payer: Self-pay | Admitting: *Deleted

## 2016-05-03 ENCOUNTER — Ambulatory Visit (INDEPENDENT_AMBULATORY_CARE_PROVIDER_SITE_OTHER): Payer: Medicare Other | Admitting: Sports Medicine

## 2016-05-03 ENCOUNTER — Encounter: Payer: Self-pay | Admitting: Sports Medicine

## 2016-05-03 DIAGNOSIS — G629 Polyneuropathy, unspecified: Secondary | ICD-10-CM

## 2016-05-03 DIAGNOSIS — L89891 Pressure ulcer of other site, stage 1: Secondary | ICD-10-CM

## 2016-05-03 DIAGNOSIS — Z89411 Acquired absence of right great toe: Secondary | ICD-10-CM

## 2016-05-03 DIAGNOSIS — L97521 Non-pressure chronic ulcer of other part of left foot limited to breakdown of skin: Secondary | ICD-10-CM

## 2016-05-03 DIAGNOSIS — B079 Viral wart, unspecified: Secondary | ICD-10-CM

## 2016-05-03 DIAGNOSIS — M79672 Pain in left foot: Secondary | ICD-10-CM

## 2016-05-03 MED ORDER — HYDROCODONE-ACETAMINOPHEN 5-325 MG PO TABS: 1.0000 | ORAL_TABLET | Freq: Four times a day (QID) | ORAL | Status: DC | PRN

## 2016-05-03 NOTE — Patient Instructions (Signed)
Pre-Operative Instructions  Congratulations, you have decided to take an important step to improving your quality of life.  You can be assured that the doctors of [facility] will be with you every step of the way.  1. Plan to be at the surgery center/hospital at least 1 (one) hour prior to your scheduled time unless otherwise directed by the surgical center/hospital staff.  You must have a responsible adult accompany you, remain during the surgery and drive you home.  Make sure you have directions to the surgical center/hospital and know how to get there on time. 2. For hospital based surgery you will need to obtain a history and physical form from your family physician within 1 month prior to the date of surgery- we will give you a form for you primary physician.  3. We make every effort to accommodate the date you request for surgery.  There are however, times where surgery dates or times have to be moved.  We will contact you as soon as possible if a change in schedule is required.   4. No Aspirin/Ibuprofen for one week before surgery.  If you are on aspirin, any non-steroidal anti-inflammatory medications (Mobic, Aleve, Ibuprofen) you should stop taking it 7 days prior to your surgery.  You make take Tylenol  For pain prior to surgery.  5. Medications- If you are taking daily heart and blood pressure medications, seizure, reflux, allergy, asthma, anxiety, pain or diabetes medications, make sure the surgery center/hospital is aware before the day of surgery so they may notify you which medications to take or avoid the day of surgery. 6. No food or drink after midnight the night before surgery unless directed otherwise by surgical center/hospital staff. 7. No alcoholic beverages 24 hours prior to surgery.  No smoking 24 hours prior to or 24 hours after surgery. 8. Wear loose pants or shorts- loose enough to fit over bandages, boots, and casts. 9. No slip on shoes, sneakers are best. 10. Bring  your boot with you to the surgery center/hospital.  Also bring crutches or a walker if your physician has prescribed it for you.  If you do not have this equipment, it will be provided for you after surgery. 11. If you have not been contracted by the surgery center/hospital by the day before your surgery, call to confirm the date and time of your surgery. 12. Leave-time from work may vary depending on the type of surgery you have.  Appropriate arrangements should be made prior to surgery with your employer. 13. Prescriptions will be provided immediately following surgery by your doctor.  Have these filled as soon as possible after surgery and take the medication as directed. 14. Remove nail polish on the operative foot. 15. Wash the night before surgery.  The night before surgery wash the foot and leg well with the antibacterial soap provided and water paying special attention to beneath the toenails and in between the toes.  Rinse thoroughly with water and dry well with a towel.  Perform this wash unless told not to do so by your physician.  Enclosed: 1 Ice pack (please put in freezer the night before surgery)   1 Hibiclens skin cleaner   Pre-op Instructions  If you have any questions regarding the instructions, do not hesitate to call our office.  [place]: [address] [place], [place] [address] [phone]  [place]: [address] [place], [place] [address] [phone]  [place]: [address]  [place], [place] [address] [phone]   Dr.   Norman Regal DPM, Dr. Matthew Wagoner DPM, Dr. M. Todd Hyatt DPM, Dr. Zaccai Chavarin DPM 

## 2016-05-03 NOTE — Progress Notes (Signed)
Patient ID: [name], male   DOB: Dec 29, 1955, 60 y.o.   MRN: [id number] Subjective: Grabiel [name] is a 60 y.o. male patient seen in office for evaluation of wart and ulceration of the Left great toe. Patient denies a history of diabetes. Admits neuropathy to level of knees. Report problem with site since 2004 with re-opening for >1 year. Previous history of osteomyelitis on right with amputation.  Patient is changing the dressing using wound gel at left hallux at home. Denies nausea/fever/vomiting/chills/night sweats/shortness of breath/pain. Patient has no other pedal complaints at this time.  Patient Active Problem List   Diagnosis Date Noted  . Toe ulcer ([place]) 03/08/2016  . Verruca 12/28/2015  . Pre-ulcerative calluses 12/28/2015  . Hx of adenomatous colonic polyps 06/09/2015  . Lipoma 03/13/2014  . Plantar warts 07/09/2012  . Cellulitis 07/04/2012  . Neuropathy ([place]) 07/04/2012  . Hyperglycemia 07/04/2012  . HYPERTENSION, BENIGN 10/20/2010  . EDEMA 08/20/2010  . WISDOM TEETH EXTRACTION, HX OF 08/20/2010  . HYPOALBUMINEMIA 08/19/2010  . ANEMIA 08/19/2010  . NICOTINE ADDICTION 08/19/2010  . NECROTIZING PNEUMONIA 08/19/2010  . OSTEOARTHRITIS 08/19/2010   Current Outpatient Prescriptions on File Prior to Visit  Medication Sig Dispense Refill  . buPROPion (WELLBUTRIN SR) 150 MG 12 hr tablet Take 150 mg by mouth 2 (two) times daily.    . cephALEXin (KEFLEX) 500 MG capsule Take 1 capsule (500 mg total) by mouth 3 (three) times daily. 30 capsule 2  . cephALEXin (KEFLEX) 500 MG capsule Take 1 capsule (500 mg total) by mouth 3 (three) times daily. 30 capsule 2  . CHANTIX CONTINUING MONTH PAK 1 MG tablet TAKE AS PER PACKAGE DIRECTIONS  1  . CHANTIX STARTING MONTH PAK 0.5 MG X 11 & 1 MG X 42 tablet TAKE AS PRESCRIBED  0  . fluorouracil (EFUDEX) 5 % cream Apply topically 2 (two) times daily. 40 g 0  . gabapentin (NEURONTIN) 300 MG capsule Take 300 mg by mouth 3 (three) times daily.    .  Multiple Vitamin (MULTIVITAMIN) tablet Take 1 tablet by mouth daily.    . pregabalin (LYRICA) 150 MG capsule Take 1 capsule (150 mg total) by mouth 2 (two) times daily. 60 capsule 1  . PROAIR HFA 108 (90 Base) MCG/ACT inhaler     . SPIRIVA HANDIHALER 18 MCG inhalation capsule INHALE 1 CAPSULE VIA HANDIHALER ONCE DAILY AT THE SAME TIME EVERY DAY  5  . Wound Dressings (CURASOL WOUND DRESSING) GEL Apply 1 application topically daily. 30 g 0   No current facility-administered medications on file prior to visit.   No Known Allergies  No results found for this or any previous visit (from the past 2160 hour(s)).   Past Surgical History  Procedure Laterality Date  . Tooth extraction  2011    multiple teeth extracted  . Toe amputation Right 06/05/2013    RIGHT GREAT TOE PARTIAL AMPUTATION    . Amputation Bilateral 06/05/2013    Procedure: RIGHT GREAT TOE AMPUTATION/LEFT GREAT TOE DEBRIDEMENT;  Surgeon: Wylene Simmer, MD;  Location: [facility];  Service: Orthopedics;  Laterality: Bilateral;  . Mouth surgery     Family History  Problem Relation Age of Onset  . Heart disease Mother   . Cancer Father   . Diabetes Father   . Pancreatic cancer Father   . Cancer Sister   . Colon cancer Neg Hx   . Rectal cancer Neg Hx   . Stomach cancer Neg Hx   . Colon polyps Neg Hx  Objective: There were no vitals filed for this visit.  General: Patient is awake, alert, oriented x 3 and in no acute distress.  Dermatology: Skin is warm and dry bilateral with a  Dense cauliflower lesion with a central partial thickness ulceration present Left plantar medial hallux. Ulceration measures 2 cm x 1 cm x 0.4 cm. There is a  keratotic border with a granular base. The ulceration does not probe to bone. There is no malodor, no active drainage, no erythema, no edema. No acute signs of infection.   Vascular: Dorsalis Pedis pulse = 1/4 Bilateral,  Posterior Tibial pulse = 1/4 Bilateral,  Capillary Fill Time < 5  seconds  Neurologic: Protective sensation absent to the level of knees using  the 5.07/10g [employer].  Musculosketal: No Pain with palpation to ulcerated area. Subjective pain with ambulation to ulcer site left hallux. No pain with compression to calves bilateral. Right hallux amputation status   Assessment and Plan:  Problem List Items Addressed This Visit      Nervous and Auditory   Neuropathy ([place])     Musculoskeletal and Integument   Toe ulcer ([place]) - Primary    Other Visit Diagnoses    Viral warts        Left foot pain        Status post amputation of great toe, right ([place])          -Examined patient and discussed the progression of the wound and treatment alternatives. -Previous Xrays reviewed -Excisionally dedbrided ulceration to healthy bleeding borders using a sterile chisel blade. -Applied topical antibiotic cream and dry sterile dressing and instructed patient to continue with daily dressings at home consisting of antibiotic cream/wound gel and bandaid/dry sterile dressing. -Advised patient to go to the ER or return to office if the wound worsens or if constitutional symptoms are present. -Rx Norco to take as needed for pain -Patient opt for surgical management. Consent obtained for Excision of lesion, laser destruction, with bone culture and possible closure. Pre and Post op course explained. Risks, benefits, alternatives explained. No guarantees given or implied. Surgical booking slip submitted and provided patient with Surgical packet and info for [facility]. Patient will need medical clearance. -Continue with surgical shoe to use post op -Patient to return to office after surgery for follow up care and evaluation or sooner if problems arise.  [name], DPM

## 2016-05-10 ENCOUNTER — Encounter: Payer: Self-pay | Admitting: *Deleted

## 2016-05-10 NOTE — Progress Notes (Signed)
Per Dr. Jacqualyn Posey, clinicals were faxed to Dr. Scot Dock.  Medical clearance was requested.  Is circulation adequate for healing?  Patient will be scheduled for Amputation Toe MPJ Joint 2nd left.

## 2016-05-17 ENCOUNTER — Encounter: Payer: Self-pay | Admitting: Sports Medicine

## 2016-05-17 DIAGNOSIS — L89891 Pressure ulcer of other site, stage 1: Secondary | ICD-10-CM | POA: Diagnosis not present

## 2016-05-17 DIAGNOSIS — D492 Neoplasm of unspecified behavior of bone, soft tissue, and skin: Secondary | ICD-10-CM

## 2016-05-17 DIAGNOSIS — B079 Viral wart, unspecified: Secondary | ICD-10-CM | POA: Diagnosis not present

## 2016-05-18 ENCOUNTER — Telehealth: Payer: Self-pay | Admitting: *Deleted

## 2016-05-18 ENCOUNTER — Telehealth: Payer: Self-pay | Admitting: Sports Medicine

## 2016-05-18 NOTE — Telephone Encounter (Addendum)
Post op courtesy call-Pt states it bled yesterday, but not so much today, but Dr. Cannon Kettle that was not unusual.  I told pt to mark the area and call if it was bigger than a 50cent piece.  Pt states he's doing okay on 1/2 pill for the pain medication doesn't make him too groggy.  I told pt not to be up on or dangling the surgery foot more than 15 mins/hour, keep the dressing clean and dry and call with any concerns.  Pt states understanding. 06/23/2016- Pt presents to office states the codes used on his [facility] will not go through for his insurance to cover his shoes. 06/29/2016-Letter of medical necessity, pt insurance information and clinical faxed to [facility]. 07/05/2016-Called Lyrica 161m refill +1 to [facility]. 08/26/2016-[employer] Medicare Complete [employer] - KJuliann Pulsein Benefits and Eligibility, call Reference #[id number], states if Provider (Furniture conservator/restorer is In-Network then if coveraged, is 80%/20% for A550)Shoes, A5513 Inserts, but is subject to Medicare Guidelines and if not covered by Medicare or denied, then Medical Necessity clinicals and Dr. SCannon Kettleletter would be sent to:[facility] [address] [place] [id number] Pt's out-of-pocket is $6700.00, with only $724.20 met. [facility] is responsible for cost of shoes and inserts = $300.00, or shoes = $165.00, which is paid prior to sending for approval. Pt will also need current [employer] rx, and clinicals from Dr. SCannon Kettle 08/27/2016-I spoke with pt and informed that ADonegalwould only accept medical necessity letters after claim had been processed, and [employer] would only begin claim process once pt had paid for the shoes and inserts. I told pt after the process was completed it would more than likely be a reimbursement transaction. Pt states he is unable to pay $300.00 at this time. I told pt I would inform Dr. SCannon Kettleand see if there was a more affordable OTC extra depth shoe. Informed pt of Dr. SLeeanne Riorecommendations, and he stated  he would talk to her at the next visit.

## 2016-05-18 NOTE — Telephone Encounter (Signed)
Post op check phone call made to patient. Patient is doing well. Had a little bit of bleeding through the dressing of which has stopped and he put a new sock on. Patient states that otherwise has no other symptoms. Advised patient to keep dressing, clean, dry, and intact. Re-inforce as needed Patient to follow up as scheduled for post op care -Dr. Cannon Kettle

## 2016-05-24 ENCOUNTER — Ambulatory Visit (INDEPENDENT_AMBULATORY_CARE_PROVIDER_SITE_OTHER): Payer: Medicare Other

## 2016-05-24 ENCOUNTER — Ambulatory Visit (INDEPENDENT_AMBULATORY_CARE_PROVIDER_SITE_OTHER): Payer: Medicare Other | Admitting: Sports Medicine

## 2016-05-24 ENCOUNTER — Encounter: Payer: Self-pay | Admitting: Sports Medicine

## 2016-05-24 DIAGNOSIS — Z9889 Other specified postprocedural states: Secondary | ICD-10-CM

## 2016-05-24 DIAGNOSIS — M79672 Pain in left foot: Secondary | ICD-10-CM | POA: Diagnosis not present

## 2016-05-24 MED ORDER — HYDROCODONE-ACETAMINOPHEN 10-325 MG PO TABS: 1.0000 | ORAL_TABLET | Freq: Four times a day (QID) | ORAL | Status: DC | PRN

## 2016-05-24 NOTE — Progress Notes (Signed)
Patient ID: [name], male   DOB: 1956/09/21, 60 y.o.   MRN: [id number] Subjective: [name] is a 60 y.o. male patient seen today in office for POV #1 (DOS 05-10-16), S/P Excision of wart with laser destruction, bone biopsy, and partial closure, Patient denies current pain at surgical site; states that when he does a lot of walking can hurt, denies calf pain, denies headache, chest pain, shortness of breath, nausea, vomiting, fever, or chills.  No other issues noted.   Patient Active Problem List   Diagnosis Date Noted  . Toe ulcer ([place]) 03/08/2016  . Verruca 12/28/2015  . Pre-ulcerative calluses 12/28/2015  . Hx of adenomatous colonic polyps 06/09/2015  . Lipoma 03/13/2014  . Plantar warts 07/09/2012  . Cellulitis 07/04/2012  . Neuropathy ([place]) 07/04/2012  . Hyperglycemia 07/04/2012  . HYPERTENSION, BENIGN 10/20/2010  . EDEMA 08/20/2010  . WISDOM TEETH EXTRACTION, HX OF 08/20/2010  . HYPOALBUMINEMIA 08/19/2010  . ANEMIA 08/19/2010  . NICOTINE ADDICTION 08/19/2010  . NECROTIZING PNEUMONIA 08/19/2010  . OSTEOARTHRITIS 08/19/2010    Current Outpatient Prescriptions on File Prior to Visit  Medication Sig Dispense Refill  . buPROPion (WELLBUTRIN SR) 150 MG 12 hr tablet Take 150 mg by mouth 2 (two) times daily.    . cephALEXin (KEFLEX) 500 MG capsule Take 1 capsule (500 mg total) by mouth 3 (three) times daily. 30 capsule 2  . cephALEXin (KEFLEX) 500 MG capsule Take 1 capsule (500 mg total) by mouth 3 (three) times daily. 30 capsule 2  . CHANTIX CONTINUING MONTH PAK 1 MG tablet TAKE AS PER PACKAGE DIRECTIONS  1  . CHANTIX STARTING MONTH PAK 0.5 MG X 11 & 1 MG X 42 tablet TAKE AS PRESCRIBED  0  . fluorouracil (EFUDEX) 5 % cream Apply topically 2 (two) times daily. 40 g 0  . gabapentin (NEURONTIN) 300 MG capsule Take 300 mg by mouth 3 (three) times daily.    . Multiple Vitamin (MULTIVITAMIN) tablet Take 1 tablet by mouth daily.    . pregabalin (LYRICA) 150 MG capsule Take 1 capsule  (150 mg total) by mouth 2 (two) times daily. 60 capsule 1  . PROAIR HFA 108 (90 Base) MCG/ACT inhaler     . SPIRIVA HANDIHALER 18 MCG inhalation capsule INHALE 1 CAPSULE VIA HANDIHALER ONCE DAILY AT THE SAME TIME EVERY DAY  5  . Wound Dressings (CURASOL WOUND DRESSING) GEL Apply 1 application topically daily. 30 g 0   No current facility-administered medications on file prior to visit.    No Known Allergies  Objective: There were no vitals filed for this visit.  General: No acute distress, AAOx3  Left foot: Retention Sutures at hallux intact at surgical site with 4x2cm ulceration with granular tissue present, mild maceration plantar hallux, mild swelling, no erythema, no warmth, no drainage, no acute signs of infection noted, Capillary fill time <3 seconds in all digits,  No pain or crepitation with range of motion left foot.  No pain with calf compression.   Post Op Xray, Left foot: No acute concerns. Soft tissue swelling within normal limits for post op status.   Assessment and Plan:  Problem List Items Addressed This Visit    None    Visit Diagnoses    Left foot pain    -  Primary    Relevant Medications    HYDROcodone-acetaminophen (NORCO) 10-325 MG tablet    Other Relevant Orders    DG Foot Complete Left    Status post left foot surgery            -  Patient seen and evaluated -Applied betadine and dry sterile dressing to surgical site left hallux secured with coban wrap and stockinet  -Advised patient to do the same at home 1-2 times until next office visit and to monitory for signs of infection, If occurs to return to office or go to ER immediately  -Advised patient to continue with post-op shoe on left foot   -Advised patient to limit activity to necessity  -Advised patient to elevate as necessary  -Refilled Norco to take as needed for pain -Will plan for possible retention suture removal at next office visit. In the meantime, patient to call office if any issues or  problems arise.   [name], DPM

## 2016-06-07 ENCOUNTER — Ambulatory Visit (INDEPENDENT_AMBULATORY_CARE_PROVIDER_SITE_OTHER): Payer: Medicare Other | Admitting: Sports Medicine

## 2016-06-07 ENCOUNTER — Encounter: Payer: Self-pay | Admitting: Sports Medicine

## 2016-06-07 DIAGNOSIS — L97521 Non-pressure chronic ulcer of other part of left foot limited to breakdown of skin: Secondary | ICD-10-CM

## 2016-06-07 DIAGNOSIS — G629 Polyneuropathy, unspecified: Secondary | ICD-10-CM

## 2016-06-07 DIAGNOSIS — Z9889 Other specified postprocedural states: Secondary | ICD-10-CM

## 2016-06-07 DIAGNOSIS — M79672 Pain in left foot: Secondary | ICD-10-CM

## 2016-06-07 MED ORDER — HYDROCODONE-ACETAMINOPHEN 10-325 MG PO TABS: 1.0000 | ORAL_TABLET | Freq: Four times a day (QID) | ORAL | Status: DC | PRN

## 2016-06-07 NOTE — Progress Notes (Signed)
Patient ID: [name], male   DOB: January 18, 1956, 60 y.o.   MRN: [id number] Subjective: [name] is a 60 y.o. male patient seen today in office for POV #2 (DOS 05-10-16), S/P Excision of wart with laser destruction, bone biopsy, and partial closure, Patient denies current pain at surgical site; states that pain is better but still hurts sometimes when he is on his foot a lot; denies calf pain, denies headache, chest pain, shortness of breath, nausea, vomiting, fever, or chills.  No other issues noted.   Patient Active Problem List   Diagnosis Date Noted  . Toe ulcer ([place]) 03/08/2016  . Verruca 12/28/2015  . Pre-ulcerative calluses 12/28/2015  . Hx of adenomatous colonic polyps 06/09/2015  . Lipoma 03/13/2014  . Plantar warts 07/09/2012  . Cellulitis 07/04/2012  . Neuropathy ([place]) 07/04/2012  . Hyperglycemia 07/04/2012  . HYPERTENSION, BENIGN 10/20/2010  . EDEMA 08/20/2010  . WISDOM TEETH EXTRACTION, HX OF 08/20/2010  . HYPOALBUMINEMIA 08/19/2010  . ANEMIA 08/19/2010  . NICOTINE ADDICTION 08/19/2010  . NECROTIZING PNEUMONIA 08/19/2010  . OSTEOARTHRITIS 08/19/2010    Current Outpatient Prescriptions on File Prior to Visit  Medication Sig Dispense Refill  . buPROPion (WELLBUTRIN SR) 150 MG 12 hr tablet Take 150 mg by mouth 2 (two) times daily.    . cephALEXin (KEFLEX) 500 MG capsule Take 1 capsule (500 mg total) by mouth 3 (three) times daily. 30 capsule 2  . cephALEXin (KEFLEX) 500 MG capsule Take 1 capsule (500 mg total) by mouth 3 (three) times daily. 30 capsule 2  . CHANTIX CONTINUING MONTH PAK 1 MG tablet TAKE AS PER PACKAGE DIRECTIONS  1  . CHANTIX STARTING MONTH PAK 0.5 MG X 11 & 1 MG X 42 tablet TAKE AS PRESCRIBED  0  . fluorouracil (EFUDEX) 5 % cream Apply topically 2 (two) times daily. 40 g 0  . gabapentin (NEURONTIN) 300 MG capsule Take 300 mg by mouth 3 (three) times daily.    . Multiple Vitamin (MULTIVITAMIN) tablet Take 1 tablet by mouth daily.    . pregabalin  (LYRICA) 150 MG capsule Take 1 capsule (150 mg total) by mouth 2 (two) times daily. 60 capsule 1  . PROAIR HFA 108 (90 Base) MCG/ACT inhaler     . SPIRIVA HANDIHALER 18 MCG inhalation capsule INHALE 1 CAPSULE VIA HANDIHALER ONCE DAILY AT THE SAME TIME EVERY DAY  5  . Wound Dressings (CURASOL WOUND DRESSING) GEL Apply 1 application topically daily. 30 g 0   No current facility-administered medications on file prior to visit.    No Known Allergies  Objective: There were no vitals filed for this visit.  General: No acute distress, AAOx3  Left foot: Retention Sutures at hallux intact at surgical site with 3.7x2cm ulceration with granular tissue present, improved maceration plantar hallux, mild swelling, no erythema, no warmth, no drainage, no acute signs of infection noted, Capillary fill time <3 seconds in all digits,  No pain or crepitation with range of motion left foot.  No pain with calf compression.    Assessment and Plan:  Problem List Items Addressed This Visit      Nervous and Auditory   Neuropathy ([place])     Musculoskeletal and Integument   Toe ulcer ([place])    Other Visit Diagnoses    Left foot pain    -  Primary    Relevant Medications    HYDROcodone-acetaminophen (NORCO) 10-325 MG tablet    Status post left foot surgery            -  Patient seen and evaluated -Retention sutures removed, Applied antibiotic cream to wound bed an d betadine around the wound and dry sterile dressing to surgical site left hallux secured with coban wrap and stockinet  -Advised patient to do the same at home 1-2 times until next office visit and to monitory for signs of infection, If occurs to return to office or go to ER immediately  -Advised patient to continue with post-op shoe on left foot   -Advised patient to limit activity to necessity  -Advised patient to elevate as necessary  -Refilled Norco to take as needed for pain -Rx given for extra depth shoes and custom inserts with [employer]; to  provide letter of necessity as requested by insurance company  -Return in 2 weeks for follow care. In the meantime, patient to call office if any issues or problems arise.   [name], DPM

## 2016-06-22 ENCOUNTER — Ambulatory Visit (INDEPENDENT_AMBULATORY_CARE_PROVIDER_SITE_OTHER): Payer: Medicare Other

## 2016-06-22 ENCOUNTER — Ambulatory Visit (INDEPENDENT_AMBULATORY_CARE_PROVIDER_SITE_OTHER): Payer: Medicare Other | Admitting: Sports Medicine

## 2016-06-22 ENCOUNTER — Encounter: Payer: Self-pay | Admitting: Sports Medicine

## 2016-06-22 DIAGNOSIS — B351 Tinea unguium: Secondary | ICD-10-CM | POA: Diagnosis not present

## 2016-06-22 DIAGNOSIS — L97521 Non-pressure chronic ulcer of other part of left foot limited to breakdown of skin: Secondary | ICD-10-CM

## 2016-06-22 DIAGNOSIS — L03032 Cellulitis of left toe: Secondary | ICD-10-CM | POA: Diagnosis not present

## 2016-06-22 DIAGNOSIS — M79672 Pain in left foot: Secondary | ICD-10-CM

## 2016-06-22 DIAGNOSIS — B079 Viral wart, unspecified: Secondary | ICD-10-CM | POA: Diagnosis not present

## 2016-06-22 DIAGNOSIS — Z89411 Acquired absence of right great toe: Secondary | ICD-10-CM

## 2016-06-22 DIAGNOSIS — L84 Corns and callosities: Secondary | ICD-10-CM

## 2016-06-22 DIAGNOSIS — Z9889 Other specified postprocedural states: Secondary | ICD-10-CM

## 2016-06-22 MED ORDER — MUPIROCIN CALCIUM 2 % EX CREA: 1.0000 "application " | TOPICAL_CREAM | Freq: Two times a day (BID) | CUTANEOUS | 0 refills | Status: DC

## 2016-06-22 MED ORDER — HYDROCODONE-ACETAMINOPHEN 10-325 MG PO TABS
1.0000 | ORAL_TABLET | Freq: Four times a day (QID) | ORAL | 0 refills | Status: DC | PRN
Start: 2016-06-22 — End: 2016-07-06

## 2016-06-22 NOTE — Progress Notes (Addendum)
Patient ID: [name], male   DOB: [birth year], 60 y.o.   MRN: [id number] Subjective: [name] is a 60 y.o. male patient seen today in office for POV #3 (DOS 05-10-16), S/P Excision of wart with laser destruction, bone biopsy, and partial closure, Patient denies current pain at surgical site; states that pain is better but still hurts sometimes when he is on his foot a lot which is unchanged from prior; denies calf pain, denies headache, chest pain, shortness of breath, nausea, vomiting, fever, or chills.  No other issues noted.   Patient Active Problem List   Diagnosis Date Noted  . Toe ulcer ([place]) 03/08/2016  . Verruca 12/28/2015  . Pre-ulcerative calluses 12/28/2015  . Hx of adenomatous colonic polyps 06/09/2015  . Lipoma 03/13/2014  . Plantar warts 07/09/2012  . Cellulitis 07/04/2012  . Neuropathy ([place]) 07/04/2012  . Hyperglycemia 07/04/2012  . HYPERTENSION, BENIGN 10/20/2010  . EDEMA 08/20/2010  . WISDOM TEETH EXTRACTION, HX OF 08/20/2010  . HYPOALBUMINEMIA 08/19/2010  . ANEMIA 08/19/2010  . NICOTINE ADDICTION 08/19/2010  . NECROTIZING PNEUMONIA 08/19/2010  . OSTEOARTHRITIS 08/19/2010    Current Outpatient Prescriptions on File Prior to Visit  Medication Sig Dispense Refill  . buPROPion (WELLBUTRIN SR) 150 MG 12 hr tablet Take 150 mg by mouth 2 (two) times daily.    . cephALEXin (KEFLEX) 500 MG capsule Take 1 capsule (500 mg total) by mouth 3 (three) times daily. 30 capsule 2  . cephALEXin (KEFLEX) 500 MG capsule Take 1 capsule (500 mg total) by mouth 3 (three) times daily. 30 capsule 2  . CHANTIX CONTINUING MONTH PAK 1 MG tablet TAKE AS PER PACKAGE DIRECTIONS  1  . CHANTIX STARTING MONTH PAK 0.5 MG X 11 & 1 MG X 42 tablet TAKE AS PRESCRIBED  0  . fluorouracil (EFUDEX) 5 % cream Apply topically 2 (two) times daily. 40 g 0  . gabapentin (NEURONTIN) 300 MG capsule Take 300 mg by mouth 3 (three) times daily.    . Multiple Vitamin (MULTIVITAMIN) tablet Take 1 tablet by mouth  daily.    . pregabalin (LYRICA) 150 MG capsule Take 1 capsule (150 mg total) by mouth 2 (two) times daily. 60 capsule 1  . PROAIR HFA 108 (90 Base) MCG/ACT inhaler     . SPIRIVA HANDIHALER 18 MCG inhalation capsule INHALE 1 CAPSULE VIA HANDIHALER ONCE DAILY AT THE SAME TIME EVERY DAY  5  . Wound Dressings (CURASOL WOUND DRESSING) GEL Apply 1 application topically daily. 30 g 0   No current facility-administered medications on file prior to visit.     No Known Allergies  Objective: There were no vitals filed for this visit.  General: No acute distress, AAOx3  Left foot: Ulceration at hallux surgical site measures 3.5 x 1cm (Last measurement 3.7x2cm) with granular tissue present, resolved maceration plantar hallux, mild swelling, no erythema, no warmth, no drainage, no acute signs of infection noted, Capillary fill time <3 seconds in all digits,  No pain or crepitation with range of motion left foot.  No pain with calf compression.    Xrays Left foot: No acute changes from prior.  Assessment and Plan:  Problem List Items Addressed This Visit      Musculoskeletal and Integument   Toe ulcer ([facility])   Relevant Medications   mupirocin cream (BACTROBAN) 2 %   Other Relevant Orders   DG Foot Complete Left    Other Visit Diagnoses    Left foot pain    -  Primary  Relevant Medications   HYDROcodone-acetaminophen (NORCO) 10-325 MG tablet   Other Relevant Orders   DG Foot Complete Left   Status post left foot surgery       Relevant Orders   DG Foot Complete Left   Status post amputation of great toe, right ([facility]CC)       Relevant Orders   DG Foot Complete Left   Dermatophytosis of nail       Relevant Medications   mupirocin cream (BACTROBAN) 2 %       -Patient seen and evaluated -Applied antibiotic cream to wound bed and dry sterile dressing to surgical site left hallux secured with coban wrap and stockinet  -Advised patient to do the same at home using Bactroban which was Rx, 1-2  times until next office visit and to monitory for signs of infection, If occurs to return to office or go to ER immediately  -Advised patient to continue with post-op shoe on left foot  -Advised patient to limit activity to necessity  -Advised patient to elevate as necessary  -Refilled Norco to take as needed for pain -Cont with request for extra depth shoes -Temp handicap for 6 months given  -Return in 2 weeks for follow care. In the meantime, patient to call office if any issues or problems arise.   [name], DPM

## 2016-06-23 NOTE — Telephone Encounter (Signed)
He is not diabetic but has neuropathy with has ulcerations with foot deformity. I told the patient that we will have to do a letter of necessity for the shoes. So this will have to be done for him and that's why its not covered. -Dr. Cannon Kettle

## 2016-06-24 ENCOUNTER — Encounter: Payer: Self-pay | Admitting: *Deleted

## 2016-07-04 ENCOUNTER — Other Ambulatory Visit: Payer: Self-pay | Admitting: Podiatry

## 2016-07-06 ENCOUNTER — Ambulatory Visit (INDEPENDENT_AMBULATORY_CARE_PROVIDER_SITE_OTHER): Payer: Medicare Other | Admitting: Sports Medicine

## 2016-07-06 ENCOUNTER — Encounter: Payer: Self-pay | Admitting: Sports Medicine

## 2016-07-06 DIAGNOSIS — L97521 Non-pressure chronic ulcer of other part of left foot limited to breakdown of skin: Secondary | ICD-10-CM

## 2016-07-06 DIAGNOSIS — M79672 Pain in left foot: Secondary | ICD-10-CM | POA: Diagnosis not present

## 2016-07-06 DIAGNOSIS — Z9889 Other specified postprocedural states: Secondary | ICD-10-CM

## 2016-07-06 DIAGNOSIS — G629 Polyneuropathy, unspecified: Secondary | ICD-10-CM

## 2016-07-06 MED ORDER — HYDROCODONE-ACETAMINOPHEN 10-325 MG PO TABS: 1.0000 | ORAL_TABLET | Freq: Four times a day (QID) | ORAL | 0 refills | Status: DC | PRN

## 2016-07-06 MED ORDER — PREGABALIN 150 MG PO CAPS: 150.0000 mg | ORAL_CAPSULE | Freq: Two times a day (BID) | ORAL | 2 refills | Status: DC

## 2016-07-06 NOTE — Progress Notes (Signed)
Patient ID: [name], male   DOB: [birth year], 60 y.o.   MRN: [id number] Subjective: [name] is a 60 y.o. male patient seen today in office for POV #4 (DOS 05-10-16), S/P Excision of wart with laser destruction, bone biopsy, and partial closure, Patient admits pain at surgical site; states that he ran out of pain meds and lyrica on Sunday; denies calf pain, denies headache, chest pain, shortness of breath, nausea, vomiting, fever, or chills.  No other issues noted.   Patient Active Problem List   Diagnosis Date Noted  . Toe ulcer ([place]) 03/08/2016  . Verruca 12/28/2015  . Pre-ulcerative calluses 12/28/2015  . Hx of adenomatous colonic polyps 06/09/2015  . Lipoma 03/13/2014  . Plantar warts 07/09/2012  . Cellulitis 07/04/2012  . Neuropathy ([place]) 07/04/2012  . Hyperglycemia 07/04/2012  . HYPERTENSION, BENIGN 10/20/2010  . EDEMA 08/20/2010  . WISDOM TEETH EXTRACTION, HX OF 08/20/2010  . HYPOALBUMINEMIA 08/19/2010  . ANEMIA 08/19/2010  . NICOTINE ADDICTION 08/19/2010  . NECROTIZING PNEUMONIA 08/19/2010  . OSTEOARTHRITIS 08/19/2010    Current Outpatient Prescriptions on File Prior to Visit  Medication Sig Dispense Refill  . atorvastatin (LIPITOR) 10 MG tablet Take 10 mg by mouth at bedtime.  5  . buPROPion (WELLBUTRIN SR) 150 MG 12 hr tablet Take 150 mg by mouth 2 (two) times daily.    . cephALEXin (KEFLEX) 500 MG capsule Take 1 capsule (500 mg total) by mouth 3 (three) times daily. 30 capsule 2  . cephALEXin (KEFLEX) 500 MG capsule Take 1 capsule (500 mg total) by mouth 3 (three) times daily. 30 capsule 2  . CHANTIX CONTINUING MONTH PAK 1 MG tablet TAKE AS PER PACKAGE DIRECTIONS  1  . CHANTIX STARTING MONTH PAK 0.5 MG X 11 & 1 MG X 42 tablet TAKE AS PRESCRIBED  0  . docusate sodium (COLACE) 100 MG capsule TAKE ONE CAPSULE BY MOUTH EVERY 6 HOURS AS NEEDED FOR CONSTIPATION  0  . fluorouracil (EFUDEX) 5 % cream Apply topically 2 (two) times daily. 40 g 0  . gabapentin (NEURONTIN)  300 MG capsule Take 300 mg by mouth 3 (three) times daily.    . Multiple Vitamin (MULTIVITAMIN) tablet Take 1 tablet by mouth daily.    . mupirocin cream (BACTROBAN) 2 % Apply 1 application topically 2 (two) times daily. 15 g 0  . PROAIR HFA 108 (90 Base) MCG/ACT inhaler     . promethazine (PHENERGAN) 25 MG tablet     . SPIRIVA HANDIHALER 18 MCG inhalation capsule INHALE 1 CAPSULE VIA HANDIHALER ONCE DAILY AT THE SAME TIME EVERY DAY  5  . SYMBICORT 160-4.5 MCG/ACT inhaler     . Wound Dressings (CURASOL WOUND DRESSING) GEL Apply 1 application topically daily. 30 g 0   No current facility-administered medications on file prior to visit.     No Known Allergies  Objective: There were no vitals filed for this visit.  General: No acute distress, AAOx3  Left foot: Ulceration at hallux surgical site measures 3 x 1cm (Last measurement 3.5x1cm) with granular tissue present, with partial skin bridge at center if ulceration bed, mild swelling, no erythema, no warmth, no drainage, no acute signs of infection noted, Capillary fill time <3 seconds in all digits,  No pain or crepitation with range of motion left foot. Neuropathy unchanged from prior. No pain with calf compression.    Assessment and Plan:  Problem List Items Addressed This Visit      Nervous and Auditory   Neuropathy ([place])  Relevant Medications   pregabalin (LYRICA) 150 MG capsule     Musculoskeletal and Integument   Toe ulcer ([facility])   Relevant Medications   pregabalin (LYRICA) 150 MG capsule    Other Visit Diagnoses    Left foot pain    -  Primary   Relevant Medications   pregabalin (LYRICA) 150 MG capsule   HYDROcodone-acetaminophen (NORCO) 10-325 MG tablet   Status post left foot surgery       Relevant Medications   pregabalin (LYRICA) 150 MG capsule       -Patient seen and evaluated -Applied Prisma dressing to wound bed and dry sterile dressing to surgical site left hallux secured with coban wrap and stockinet   -Advised patient to do the same at home using Bactroban which was Rx, 1-2 times until next office visit and to monitory for signs of infection, If occurs to return to office or go to ER immediately  -Advised patient to continue with post-op shoe on left foot  -Advised patient to limit activity to necessity  -Advised patient to elevate as necessary  -Refilled Norco to take as needed for pain and refilled Lyrica for neuropathy -Cont with request for extra depth shoes; Advised patient that because he is NOT diabetic we will have to fill out papers for medical necessity to try to get coverage for shoes; patient states that he has papers from insurance company that he will bring by the office -Return in 2 weeks for follow up care. In the meantime, patient to call office if any issues or problems arise.   [name], DPM

## 2016-07-20 ENCOUNTER — Encounter (INDEPENDENT_AMBULATORY_CARE_PROVIDER_SITE_OTHER): Payer: Self-pay

## 2016-07-20 ENCOUNTER — Encounter: Payer: Self-pay | Admitting: Sports Medicine

## 2016-07-20 ENCOUNTER — Ambulatory Visit (INDEPENDENT_AMBULATORY_CARE_PROVIDER_SITE_OTHER): Payer: Medicare Other | Admitting: Sports Medicine

## 2016-07-20 DIAGNOSIS — L97521 Non-pressure chronic ulcer of other part of left foot limited to breakdown of skin: Secondary | ICD-10-CM

## 2016-07-20 DIAGNOSIS — G629 Polyneuropathy, unspecified: Secondary | ICD-10-CM

## 2016-07-20 DIAGNOSIS — M79672 Pain in left foot: Secondary | ICD-10-CM

## 2016-07-20 DIAGNOSIS — Z9889 Other specified postprocedural states: Secondary | ICD-10-CM

## 2016-07-20 DIAGNOSIS — L89891 Pressure ulcer of other site, stage 1: Secondary | ICD-10-CM | POA: Diagnosis not present

## 2016-07-20 MED ORDER — HYDROCODONE-ACETAMINOPHEN 10-325 MG PO TABS: 1.0000 | ORAL_TABLET | Freq: Four times a day (QID) | ORAL | 0 refills | Status: DC | PRN

## 2016-07-20 NOTE — Progress Notes (Signed)
Patient ID: [name], male   DOB: [birth year], 60 y.o.   MRN: [id number] Subjective: [name] is a 60 y.o. male patient seen today in office for POV #5 (DOS 05-10-16), S/P Excision of wart with laser destruction, bone biopsy, and partial closure, Patient admits pain at surgical site;controlled with Norco  and lyrica; denies calf pain, denies headache, chest pain, shortness of breath, nausea, vomiting, fever, or chills.  No other issues noted.   Patient Active Problem List   Diagnosis Date Noted  . Toe ulcer ([place]) 03/08/2016  . Verruca 12/28/2015  . Pre-ulcerative calluses 12/28/2015  . Hx of adenomatous colonic polyps 06/09/2015  . Lipoma 03/13/2014  . Plantar warts 07/09/2012  . Cellulitis 07/04/2012  . Neuropathy ([place]) 07/04/2012  . Hyperglycemia 07/04/2012  . HYPERTENSION, BENIGN 10/20/2010  . EDEMA 08/20/2010  . WISDOM TEETH EXTRACTION, HX OF 08/20/2010  . HYPOALBUMINEMIA 08/19/2010  . ANEMIA 08/19/2010  . NICOTINE ADDICTION 08/19/2010  . NECROTIZING PNEUMONIA 08/19/2010  . OSTEOARTHRITIS 08/19/2010    Current Outpatient Prescriptions on File Prior to Visit  Medication Sig Dispense Refill  . atorvastatin (LIPITOR) 10 MG tablet Take 10 mg by mouth at bedtime.  5  . buPROPion (WELLBUTRIN SR) 150 MG 12 hr tablet Take 150 mg by mouth 2 (two) times daily.    . cephALEXin (KEFLEX) 500 MG capsule Take 1 capsule (500 mg total) by mouth 3 (three) times daily. 30 capsule 2  . cephALEXin (KEFLEX) 500 MG capsule Take 1 capsule (500 mg total) by mouth 3 (three) times daily. 30 capsule 2  . CHANTIX CONTINUING MONTH PAK 1 MG tablet TAKE AS PER PACKAGE DIRECTIONS  1  . CHANTIX STARTING MONTH PAK 0.5 MG X 11 & 1 MG X 42 tablet TAKE AS PRESCRIBED  0  . docusate sodium (COLACE) 100 MG capsule TAKE ONE CAPSULE BY MOUTH EVERY 6 HOURS AS NEEDED FOR CONSTIPATION  0  . fluorouracil (EFUDEX) 5 % cream Apply topically 2 (two) times daily. 40 g 0  . gabapentin (NEURONTIN) 300 MG capsule Take 300 mg  by mouth 3 (three) times daily.    [facility] HYDROcodone-acetaminophen (NORCO) 10-325 MG tablet Take 1 tablet by mouth every 6 (six) hours as needed. 60 tablet 0  . Multiple Vitamin (MULTIVITAMIN) tablet Take 1 tablet by mouth daily.    . mupirocin cream (BACTROBAN) 2 % Apply 1 application topically 2 (two) times daily. 15 g 0  . pregabalin (LYRICA) 150 MG capsule Take 1 capsule (150 mg total) by mouth 2 (two) times daily. 60 capsule 2  . PROAIR HFA 108 (90 Base) MCG/ACT inhaler     . promethazine (PHENERGAN) 25 MG tablet     . SPIRIVA HANDIHALER 18 MCG inhalation capsule INHALE 1 CAPSULE VIA HANDIHALER ONCE DAILY AT THE SAME TIME EVERY DAY  5  . SYMBICORT 160-4.5 MCG/ACT inhaler     . Wound Dressings (CURASOL WOUND DRESSING) GEL Apply 1 application topically daily. 30 g 0   No current facility-administered medications on file prior to visit.     No Known Allergies  Objective: There were no vitals filed for this visit.  General: No acute distress, AAOx3  Left foot: Ulceration at hallux surgical site measures 2 x 1cm (Last measurement 3x1cm) with granular tissue present, with partial skin bridge at center if ulceration bed, mild swelling, no erythema, no warmth, no drainage, no acute signs of infection noted, Capillary fill time <3 seconds in all digits,  No pain or crepitation with range of motion  left foot. Neuropathy unchanged from prior. No pain with calf compression.    Assessment and Plan:  Problem List Items Addressed This Visit      Nervous and Auditory   Neuropathy ([place])     Musculoskeletal and Integument   Toe ulcer ([place])    Other Visit Diagnoses    Status post left foot surgery    -  Primary   Left foot pain       Relevant Medications   HYDROcodone-acetaminophen (NORCO) 10-325 MG tablet     -Patient seen and evaluated -Applied topical antibiotic cream dressing to wound bed and dry sterile dressing to surgical site left hallux secured with coban wrap and stockinet  -Advised  patient to do the same with finishing Prisma at home and then return to using Bactroban which was Rx, 1-2 times until next office visit and to monitor for signs of infection, If occurs to return to office or go to ER immediately  -Advised patient to continue with post-op shoe on left foot  -Advised patient to limit activity to necessity  -Advised patient to elevate as necessary  -Refilled Norco to take as needed for pain  - Continue with Lyrica for neuropathy -Cont with request for extra depth shoes; Advised patient that because he is NOT diabetic we will have to fill out papers for medical necessity to try to get coverage for shoes; patient states that he has papers from insurance company that he will bring by the office; Forgot them and will bring next visit -Return in 2 weeks for follow up care. In the meantime, patient to call office if any issues or problems arise.   [name], DPM

## 2016-08-03 ENCOUNTER — Encounter: Payer: Self-pay | Admitting: Sports Medicine

## 2016-08-03 ENCOUNTER — Ambulatory Visit (INDEPENDENT_AMBULATORY_CARE_PROVIDER_SITE_OTHER): Payer: Medicare Other | Admitting: Sports Medicine

## 2016-08-03 DIAGNOSIS — Z9889 Other specified postprocedural states: Secondary | ICD-10-CM | POA: Diagnosis not present

## 2016-08-03 DIAGNOSIS — G629 Polyneuropathy, unspecified: Secondary | ICD-10-CM

## 2016-08-03 DIAGNOSIS — M79672 Pain in left foot: Secondary | ICD-10-CM

## 2016-08-03 DIAGNOSIS — L97521 Non-pressure chronic ulcer of other part of left foot limited to breakdown of skin: Secondary | ICD-10-CM

## 2016-08-03 MED ORDER — HYDROCODONE-ACETAMINOPHEN 10-325 MG PO TABS: 1.0000 | ORAL_TABLET | Freq: Four times a day (QID) | ORAL | 0 refills | Status: DC | PRN

## 2016-08-03 NOTE — Progress Notes (Signed)
Patient ID: [name], male   DOB: [birth year], 60 y.o.   MRN: [id number] Subjective: [name] is a 60 y.o. male patient seen today in office for POV #6 (DOS 05-10-16), S/P Excision of wart with laser destruction, bone biopsy, and partial closure, Patient  Did not change dressing because he said he "got busy", admits pain at surgical site;controlled with Norco  and lyrica; denies calf pain, denies headache, chest pain, shortness of breath, nausea, vomiting, fever, or chills.  No other issues noted.   Patient Active Problem List   Diagnosis Date Noted  . Toe ulcer ([facility]) 03/08/2016  . Verruca 12/28/2015  . Pre-ulcerative calluses 12/28/2015  . Hx of adenomatous colonic polyps 06/09/2015  . Lipoma 03/13/2014  . Plantar warts 07/09/2012  . Cellulitis 07/04/2012  . Neuropathy ([facility]) 07/04/2012  . Hyperglycemia 07/04/2012  . HYPERTENSION, BENIGN 10/20/2010  . EDEMA 08/20/2010  . WISDOM TEETH EXTRACTION, HX OF 08/20/2010  . HYPOALBUMINEMIA 08/19/2010  . ANEMIA 08/19/2010  . NICOTINE ADDICTION 08/19/2010  . NECROTIZING PNEUMONIA 08/19/2010  . OSTEOARTHRITIS 08/19/2010    Current Outpatient Prescriptions on File Prior to Visit  Medication Sig Dispense Refill  . atorvastatin (LIPITOR) 10 MG tablet Take 10 mg by mouth at bedtime.  5  . buPROPion (WELLBUTRIN SR) 150 MG 12 hr tablet Take 150 mg by mouth 2 (two) times daily.    . cephALEXin (KEFLEX) 500 MG capsule Take 1 capsule (500 mg total) by mouth 3 (three) times daily. 30 capsule 2  . cephALEXin (KEFLEX) 500 MG capsule Take 1 capsule (500 mg total) by mouth 3 (three) times daily. 30 capsule 2  . CHANTIX CONTINUING MONTH PAK 1 MG tablet TAKE AS PER PACKAGE DIRECTIONS  1  . CHANTIX STARTING MONTH PAK 0.5 MG X 11 & 1 MG X 42 tablet TAKE AS PRESCRIBED  0  . docusate sodium (COLACE) 100 MG capsule TAKE ONE CAPSULE BY MOUTH EVERY 6 HOURS AS NEEDED FOR CONSTIPATION  0  . fluorouracil (EFUDEX) 5 % cream Apply topically 2 (two) times daily. 40 g  0  . gabapentin (NEURONTIN) 300 MG capsule Take 300 mg by mouth 3 (three) times daily.    . Multiple Vitamin (MULTIVITAMIN) tablet Take 1 tablet by mouth daily.    . mupirocin cream (BACTROBAN) 2 % Apply 1 application topically 2 (two) times daily. 15 g 0  . pregabalin (LYRICA) 150 MG capsule Take 1 capsule (150 mg total) by mouth 2 (two) times daily. 60 capsule 2  . PROAIR HFA 108 (90 Base) MCG/ACT inhaler     . promethazine (PHENERGAN) 25 MG tablet     . SPIRIVA HANDIHALER 18 MCG inhalation capsule INHALE 1 CAPSULE VIA HANDIHALER ONCE DAILY AT THE SAME TIME EVERY DAY  5  . SYMBICORT 160-4.5 MCG/ACT inhaler     . Wound Dressings (CURASOL WOUND DRESSING) GEL Apply 1 application topically daily. 30 g 0   No current facility-administered medications on file prior to visit.     No Known Allergies  Objective: There were no vitals filed for this visit.  General: No acute distress, AAOx3  Left foot: Ulceration at hallux surgical site measures 1.5 x 1cm (Last measurement 2x1cm) with granular tissue present, with partial skin bridge at center if ulceration bed, mild swelling, no erythema, no warmth, no drainage, no acute signs of infection noted, Capillary fill time <3 seconds in all digits,  No pain or crepitation with range of motion left foot. Neuropathy unchanged from prior. No pain with calf  compression.    Assessment and Plan:  Problem List Items Addressed This Visit      Nervous and Auditory   Neuropathy ([place])     Musculoskeletal and Integument   Toe ulcer ([place])    Other Visit Diagnoses    Status post left foot surgery    -  Primary   Left foot pain       Relevant Medications   HYDROcodone-acetaminophen (NORCO) 10-325 MG tablet     -Patient seen and evaluated -Applied Prisma dressing to wound bed and dry sterile dressing to surgical site left hallux secured with coban wrap and stockinet  -Advised patient to do the same with finishing Prisma at home and then return to using  Bactroban which was Rx previous, 1-2 times until next office visit and to monitor for signs of infection, If occurs to return to office or go to ER immediately  -Advised patient to continue with post-op shoe on left foot  -Advised patient to limit activity to necessity  -Advised patient to elevate as necessary  -Refilled Norco to take as needed for pain  -Continue with Lyrica for neuropathy -Cont with request for extra depth shoes; Advised patient that because he is NOT diabetic we will have to fill out papers for medical necessity to try to get coverage for shoes; patient states that he has papers from insurance company that he will bring by the office; Forgot them and will bring next visit -Return in 3 weeks for follow up care. In the meantime, patient to call office if any issues or problems arise.   [name], DPM

## 2016-08-24 ENCOUNTER — Ambulatory Visit (INDEPENDENT_AMBULATORY_CARE_PROVIDER_SITE_OTHER): Payer: Medicare Other | Admitting: Sports Medicine

## 2016-08-24 ENCOUNTER — Encounter: Payer: Self-pay | Admitting: Sports Medicine

## 2016-08-24 DIAGNOSIS — G629 Polyneuropathy, unspecified: Secondary | ICD-10-CM

## 2016-08-24 DIAGNOSIS — L97521 Non-pressure chronic ulcer of other part of left foot limited to breakdown of skin: Secondary | ICD-10-CM

## 2016-08-24 DIAGNOSIS — L89891 Pressure ulcer of other site, stage 1: Secondary | ICD-10-CM | POA: Diagnosis not present

## 2016-08-24 DIAGNOSIS — M79672 Pain in left foot: Secondary | ICD-10-CM

## 2016-08-24 DIAGNOSIS — Z9889 Other specified postprocedural states: Secondary | ICD-10-CM

## 2016-08-24 MED ORDER — CEPHALEXIN 500 MG PO CAPS: 500.0000 mg | ORAL_CAPSULE | Freq: Three times a day (TID) | ORAL | 2 refills | Status: DC

## 2016-08-24 MED ORDER — HYDROCODONE-ACETAMINOPHEN 10-325 MG PO TABS: 1.0000 | ORAL_TABLET | Freq: Four times a day (QID) | ORAL | 0 refills | Status: DC | PRN

## 2016-08-24 NOTE — Progress Notes (Signed)
Patient ID: [name], male   DOB: 09/23/1956, 60 y.o.   MRN: [id number] Subjective: [name] is a 60 y.o. male patient seen today in office for POV #7 (DOS 05-10-16), S/P Excision of wart with laser destruction, bone biopsy, and partial closure, Patient states changing dressing every other day, admits pain at surgical site especially when there's not enough padding under big toe;controlled with Norco and lyrica; denies calf pain, denies headache, chest pain, shortness of breath, nausea, vomiting, fever, or chills.  No other issues noted.   Patient Active Problem List   Diagnosis Date Noted  . Toe ulcer ([place]) 03/08/2016  . Verruca 12/28/2015  . Pre-ulcerative calluses 12/28/2015  . Hx of adenomatous colonic polyps 06/09/2015  . Lipoma 03/13/2014  . Plantar warts 07/09/2012  . Cellulitis 07/04/2012  . Neuropathy ([place]) 07/04/2012  . Hyperglycemia 07/04/2012  . HYPERTENSION, BENIGN 10/20/2010  . EDEMA 08/20/2010  . WISDOM TEETH EXTRACTION, HX OF 08/20/2010  . HYPOALBUMINEMIA 08/19/2010  . ANEMIA 08/19/2010  . NICOTINE ADDICTION 08/19/2010  . NECROTIZING PNEUMONIA 08/19/2010  . OSTEOARTHRITIS 08/19/2010    Current Outpatient Prescriptions on File Prior to Visit  Medication Sig Dispense Refill  . atorvastatin (LIPITOR) 10 MG tablet Take 10 mg by mouth at bedtime.  5  . buPROPion (WELLBUTRIN SR) 150 MG 12 hr tablet Take 150 mg by mouth 2 (two) times daily.    . CHANTIX CONTINUING MONTH PAK 1 MG tablet TAKE AS PER PACKAGE DIRECTIONS  1  . CHANTIX STARTING MONTH PAK 0.5 MG X 11 & 1 MG X 42 tablet TAKE AS PRESCRIBED  0  . docusate sodium (COLACE) 100 MG capsule TAKE ONE CAPSULE BY MOUTH EVERY 6 HOURS AS NEEDED FOR CONSTIPATION  0  . fluorouracil (EFUDEX) 5 % cream Apply topically 2 (two) times daily. 40 g 0  . gabapentin (NEURONTIN) 300 MG capsule Take 300 mg by mouth 3 (three) times daily.    . Multiple Vitamin (MULTIVITAMIN) tablet Take 1 tablet by mouth daily.    . mupirocin cream  (BACTROBAN) 2 % Apply 1 application topically 2 (two) times daily. 15 g 0  . pregabalin (LYRICA) 150 MG capsule Take 1 capsule (150 mg total) by mouth 2 (two) times daily. 60 capsule 2  . PROAIR HFA 108 (90 Base) MCG/ACT inhaler     . promethazine (PHENERGAN) 25 MG tablet     . SPIRIVA HANDIHALER 18 MCG inhalation capsule INHALE 1 CAPSULE VIA HANDIHALER ONCE DAILY AT THE SAME TIME EVERY DAY  5  . SYMBICORT 160-4.5 MCG/ACT inhaler     . Wound Dressings (CURASOL WOUND DRESSING) GEL Apply 1 application topically daily. 30 g 0   No current facility-administered medications on file prior to visit.     No Known Allergies  Objective: There were no vitals filed for this visit.  General: No acute distress, AAOx3  Left foot: Ulceration at hallux surgical site measures 0.5 x 0.5cm (Last measurement 1.5x1cm) with granular tissue present, with partial skin bridge at center if ulceration bed, mild swelling, no erythema, no warmth, no drainage, no acute signs of infection noted, Capillary fill time <3 seconds in all digits,  No pain or crepitation with range of motion left foot. Neuropathy unchanged from prior. No pain with calf compression.    Assessment and Plan:  Problem List Items Addressed This Visit      Nervous and Auditory   Neuropathy ([place])     Musculoskeletal and Integument   Toe ulcer ([place]) - Primary  Other Visit Diagnoses    Status post left foot surgery       Left foot pain       Relevant Medications   HYDROcodone-acetaminophen (NORCO) 10-325 MG tablet     -Patient seen and evaluated -Applied Prisma dressing to wound bed and dry sterile dressing to surgical site left hallux secured with coban wrap and stockinet  -Advised patient to do the same with finishing Prisma at home -Advised to monitor for signs of infection, If occurs to return to office or go to ER immediately  -Advised patient to continue with post-op shoe on left foot. Refilled Keflex to have in case wound starts to  worsen -Advised patient to limit activity to necessity  -Advised patient to elevate as necessary  -Refilled Norco to take as needed for pain  -Continue with Lyrica for neuropathy -Cont with request for extra depth shoes; Advised patient that because he is NOT diabetic we will have to fill out papers for medical necessity to try to get coverage for shoes; patient states that he has papers from insurance company that were obtained and scanned to chart; office to follow up  -Return in 2-3 weeks for follow up care. In the meantime, patient to call office if any issues or problems arise.   [name], DPM

## 2016-08-26 NOTE — Telephone Encounter (Signed)
Will you call the patient and inform him. A copy of his [facility] Rx is under the media tab (06-07-16) if [facility] needs a new Rx then I can complete another Rx when he comes to office for follow up visit if he wants to proceed with shoes. Thank you so much! Dr. Cannon Kettle

## 2016-08-26 NOTE — Progress Notes (Signed)
DOS 06.19.2017 Left Excision of Wart and Laser Treatment with Closure and Bone Biopsy Hallux

## 2016-08-27 NOTE — Telephone Encounter (Signed)
SAS or [employer] sneakers. These brands usually offer wider or deeper models.  He can purchase from an array of stores like [employer], [employer] ([place]), [employer] store ([place]), [place], [employer], [employer] store sometimes as well.   -Dr Chauncey Cruel

## 2016-09-07 ENCOUNTER — Encounter: Payer: Self-pay | Admitting: Sports Medicine

## 2016-09-07 ENCOUNTER — Ambulatory Visit (INDEPENDENT_AMBULATORY_CARE_PROVIDER_SITE_OTHER): Payer: Medicare Other | Admitting: Sports Medicine

## 2016-09-07 DIAGNOSIS — Z9889 Other specified postprocedural states: Secondary | ICD-10-CM

## 2016-09-07 DIAGNOSIS — G629 Polyneuropathy, unspecified: Secondary | ICD-10-CM | POA: Diagnosis not present

## 2016-09-07 DIAGNOSIS — M79672 Pain in left foot: Secondary | ICD-10-CM | POA: Diagnosis not present

## 2016-09-07 DIAGNOSIS — L97521 Non-pressure chronic ulcer of other part of left foot limited to breakdown of skin: Secondary | ICD-10-CM | POA: Diagnosis not present

## 2016-09-07 MED ORDER — HYDROCODONE-ACETAMINOPHEN 10-325 MG PO TABS: 1.0000 | ORAL_TABLET | Freq: Four times a day (QID) | ORAL | 0 refills | Status: DC | PRN

## 2016-09-07 NOTE — Progress Notes (Signed)
Patient ID: [name], male   DOB: [birth year], 60 y.o.   MRN: [id number] Subjective: [name] is a 60 y.o. male patient seen today in office for POV #8 (DOS 05-10-16), S/P Excision of wart with laser destruction, bone biopsy, and partial closure, Patient states changing dressing every other day using Prisma, admits pain at surgical site especially when there's not enough padding under big toe;controlled with Norco and lyrica; denies calf pain, denies headache, chest pain, shortness of breath, nausea, vomiting, fever, or chills.  No other issues noted.   Patient Active Problem List   Diagnosis Date Noted  . Toe ulcer ([place]) 03/08/2016  . Verruca 12/28/2015  . Pre-ulcerative calluses 12/28/2015  . Hx of adenomatous colonic polyps 06/09/2015  . Lipoma 03/13/2014  . Plantar warts 07/09/2012  . Cellulitis 07/04/2012  . Neuropathy ([place]) 07/04/2012  . Hyperglycemia 07/04/2012  . HYPERTENSION, BENIGN 10/20/2010  . EDEMA 08/20/2010  . WISDOM TEETH EXTRACTION, HX OF 08/20/2010  . HYPOALBUMINEMIA 08/19/2010  . ANEMIA 08/19/2010  . NICOTINE ADDICTION 08/19/2010  . NECROTIZING PNEUMONIA 08/19/2010  . OSTEOARTHRITIS 08/19/2010    Current Outpatient Prescriptions on File Prior to Visit  Medication Sig Dispense Refill  . atorvastatin (LIPITOR) 10 MG tablet Take 10 mg by mouth at bedtime.  5  . buPROPion (WELLBUTRIN SR) 150 MG 12 hr tablet Take 150 mg by mouth 2 (two) times daily.    . cephALEXin (KEFLEX) 500 MG capsule Take 1 capsule (500 mg total) by mouth 3 (three) times daily. 30 capsule 2  . CHANTIX CONTINUING MONTH PAK 1 MG tablet TAKE AS PER PACKAGE DIRECTIONS  1  . CHANTIX STARTING MONTH PAK 0.5 MG X 11 & 1 MG X 42 tablet TAKE AS PRESCRIBED  0  . docusate sodium (COLACE) 100 MG capsule TAKE ONE CAPSULE BY MOUTH EVERY 6 HOURS AS NEEDED FOR CONSTIPATION  0  . docusate sodium (COLACE) 100 MG capsule Take 100 mg by mouth daily as needed for mild constipation.    . fluorouracil (EFUDEX) 5 %  cream Apply topically 2 (two) times daily. 40 g 0  . gabapentin (NEURONTIN) 300 MG capsule Take 300 mg by mouth 3 (three) times daily.    Marland Kitchen HYDROcodone-acetaminophen (NORCO) 10-325 MG tablet Take 1 tablet by mouth every 6 (six) hours as needed. 60 tablet 0  . Multiple Vitamin (MULTIVITAMIN) tablet Take 1 tablet by mouth daily.    . mupirocin cream (BACTROBAN) 2 % Apply 1 application topically 2 (two) times daily. 15 g 0  . pregabalin (LYRICA) 150 MG capsule Take 1 capsule (150 mg total) by mouth 2 (two) times daily. 60 capsule 2  . PROAIR HFA 108 (90 Base) MCG/ACT inhaler     . promethazine (PHENERGAN) 25 MG tablet     . promethazine (PHENERGAN) 25 MG tablet Take 25 mg by mouth every 6 (six) hours as needed for nausea or vomiting.    Marland Kitchen SPIRIVA HANDIHALER 18 MCG inhalation capsule INHALE 1 CAPSULE VIA HANDIHALER ONCE DAILY AT THE SAME TIME EVERY DAY  5  . SYMBICORT 160-4.5 MCG/ACT inhaler     . Wound Dressings (CURASOL WOUND DRESSING) GEL Apply 1 application topically daily. 30 g 0   No current facility-administered medications on file prior to visit.     No Known Allergies  Objective: There were no vitals filed for this visit.  General: No acute distress, AAOx3  Left foot: Ulceration at hallux surgical site measures 0.5 x 0.5cm at distal opening and 0.5x0.3cm at proximal opening (  Last measurement 0.5x0.5cm) with granular tissue present, with partial skin bridge at center if ulceration bed, mild swelling, no erythema, no warmth, no drainage, no acute signs of infection noted, Capillary fill time <3 seconds in all digits,  No pain or crepitation with range of motion left foot. Neuropathy unchanged from prior. No pain with calf compression.    Assessment and Plan:  Problem List Items Addressed This Visit      Nervous and Auditory   Neuropathy ([place])    Other Visit Diagnoses    Status post left foot surgery    -  Primary   Toe ulcer, left, limited to breakdown of skin ([facility])       Left  foot pain       Relevant Medications   HYDROcodone-acetaminophen (NORCO) 10-325 MG tablet     -Patient seen and evaluated -Applied Prisma dressing to wound bed and dry sterile dressing to surgical site left hallux secured with coban wrap and stockinet  -Advised patient to do the same with finishing Prisma at home -Advised to monitor for signs of infection, If occurs to return to office or go to ER immediately  -Advised patient to continue with post-op shoe on left foot -Advised patient to limit activity to necessity  -Advised patient to elevate as necessary  -Refilled Norco to take as needed for pain  -Continue with Lyrica for neuropathy -Awaiting extra depth shoes -Return in 2-3 weeks for follow up care. In the meantime, patient to call office if any issues or problems arise.   [name], DPM

## 2016-09-20 ENCOUNTER — Telehealth: Payer: Self-pay | Admitting: *Deleted

## 2016-09-20 DIAGNOSIS — L97521 Non-pressure chronic ulcer of other part of left foot limited to breakdown of skin: Secondary | ICD-10-CM

## 2016-09-20 DIAGNOSIS — Z9889 Other specified postprocedural states: Secondary | ICD-10-CM

## 2016-09-20 DIAGNOSIS — M79672 Pain in left foot: Secondary | ICD-10-CM

## 2016-09-20 DIAGNOSIS — G629 Polyneuropathy, unspecified: Secondary | ICD-10-CM

## 2016-09-20 MED ORDER — HYDROCODONE-ACETAMINOPHEN 10-325 MG PO TABS: 1.0000 | ORAL_TABLET | Freq: Four times a day (QID) | ORAL | 0 refills | Status: DC | PRN

## 2016-09-20 NOTE — Telephone Encounter (Addendum)
Pt request refill of pain medication. Dr. Marcene Duos refill of Hydrocodone 10/325 as previously. Informed pt the rx would be ready for pick up in the [place] office. 09/23/2016-[facility] sent request for 90 days Lyrica 150mg .  Dr. Berton Lan refill as 90 days +2refills. Returned fax.

## 2016-09-23 MED ORDER — PREGABALIN 150 MG PO CAPS: 150.0000 mg | ORAL_CAPSULE | Freq: Two times a day (BID) | ORAL | 2 refills | Status: DC

## 2016-09-28 ENCOUNTER — Ambulatory Visit (INDEPENDENT_AMBULATORY_CARE_PROVIDER_SITE_OTHER): Payer: Medicare Other | Admitting: Sports Medicine

## 2016-09-28 ENCOUNTER — Encounter: Payer: Self-pay | Admitting: Sports Medicine

## 2016-09-28 DIAGNOSIS — M79672 Pain in left foot: Secondary | ICD-10-CM

## 2016-09-28 DIAGNOSIS — L97521 Non-pressure chronic ulcer of other part of left foot limited to breakdown of skin: Secondary | ICD-10-CM

## 2016-09-28 DIAGNOSIS — Z9889 Other specified postprocedural states: Secondary | ICD-10-CM

## 2016-09-28 DIAGNOSIS — G629 Polyneuropathy, unspecified: Secondary | ICD-10-CM

## 2016-09-28 MED ORDER — HYDROCODONE-ACETAMINOPHEN 10-325 MG PO TABS: 1.0000 | ORAL_TABLET | Freq: Four times a day (QID) | ORAL | 0 refills | Status: DC | PRN

## 2016-09-28 NOTE — Progress Notes (Signed)
Patient ID: [name], male   DOB: August 08, 1956, 60 y.o.   MRN: [id number] Subjective: [name] is a 60 y.o. male patient seen today in office for POV #9 (DOS 05-10-16), S/P Excision of wart with laser destruction, bone biopsy, and partial closure, Patient states changing dressing every other day using Prisma, admits pain at surgical site especially when he does a lot of walking and standing and pain up leg related to neuropathy;controlled with Norco and lyrica; denies calf pain, denies headache, chest pain, shortness of breath, nausea, vomiting, fever, or chills.  No other issues noted.   Patient Active Problem List   Diagnosis Date Noted  . Toe ulcer ([place]) 03/08/2016  . Verruca 12/28/2015  . Pre-ulcerative calluses 12/28/2015  . Hx of adenomatous colonic polyps 06/09/2015  . Lipoma 03/13/2014  . Plantar warts 07/09/2012  . Cellulitis 07/04/2012  . Neuropathy ([place]) 07/04/2012  . Hyperglycemia 07/04/2012  . HYPERTENSION, BENIGN 10/20/2010  . EDEMA 08/20/2010  . WISDOM TEETH EXTRACTION, HX OF 08/20/2010  . HYPOALBUMINEMIA 08/19/2010  . ANEMIA 08/19/2010  . NICOTINE ADDICTION 08/19/2010  . NECROTIZING PNEUMONIA 08/19/2010  . OSTEOARTHRITIS 08/19/2010    Current Outpatient Prescriptions on File Prior to Visit  Medication Sig Dispense Refill  . atorvastatin (LIPITOR) 10 MG tablet Take 10 mg by mouth at bedtime.  5  . buPROPion (WELLBUTRIN SR) 150 MG 12 hr tablet Take 150 mg by mouth 2 (two) times daily.    . cephALEXin (KEFLEX) 500 MG capsule Take 1 capsule (500 mg total) by mouth 3 (three) times daily. 30 capsule 2  . CHANTIX CONTINUING MONTH PAK 1 MG tablet TAKE AS PER PACKAGE DIRECTIONS  1  . CHANTIX STARTING MONTH PAK 0.5 MG X 11 & 1 MG X 42 tablet TAKE AS PRESCRIBED  0  . docusate sodium (COLACE) 100 MG capsule TAKE ONE CAPSULE BY MOUTH EVERY 6 HOURS AS NEEDED FOR CONSTIPATION  0  . docusate sodium (COLACE) 100 MG capsule Take 100 mg by mouth daily as needed for mild  constipation.    . fluorouracil (EFUDEX) 5 % cream Apply topically 2 (two) times daily. 40 g 0  . gabapentin (NEURONTIN) 300 MG capsule Take 300 mg by mouth 3 (three) times daily.    . Multiple Vitamin (MULTIVITAMIN) tablet Take 1 tablet by mouth daily.    . mupirocin cream (BACTROBAN) 2 % Apply 1 application topically 2 (two) times daily. 15 g 0  . pregabalin (LYRICA) 150 MG capsule Take 1 capsule (150 mg total) by mouth 2 (two) times daily. 90 capsule 2  . PROAIR HFA 108 (90 Base) MCG/ACT inhaler     . promethazine (PHENERGAN) 25 MG tablet     . promethazine (PHENERGAN) 25 MG tablet Take 25 mg by mouth every 6 (six) hours as needed for nausea or vomiting.    Marland Kitchen SPIRIVA HANDIHALER 18 MCG inhalation capsule INHALE 1 CAPSULE VIA HANDIHALER ONCE DAILY AT THE SAME TIME EVERY DAY  5  . SYMBICORT 160-4.5 MCG/ACT inhaler     . Wound Dressings (CURASOL WOUND DRESSING) GEL Apply 1 application topically daily. 30 g 0   No current facility-administered medications on file prior to visit.     No Known Allergies  Objective: There were no vitals filed for this visit.  General: No acute distress, AAOx3  Left foot: Ulceration at hallux surgical site measures 0.2 x 0.2cm at distal opening and 0.3x0.2cm at proximal opening (Last measurement <0.5x0.5cm) with granular tissue present, with partial skin bridge at center  if ulceration bed, mild swelling, no erythema, no warmth, no drainage, no acute signs of infection noted, Capillary fill time <3 seconds in all digits,  No pain or crepitation with range of motion left foot. Neuropathy unchanged from prior. No pain with calf compression.    Assessment and Plan:  Problem List Items Addressed This Visit      Nervous and Auditory   Neuropathy ([place])    Other Visit Diagnoses    Left foot pain    -  Primary   Status post left foot surgery       Toe ulcer, left, limited to breakdown of skin ([place])         -Patient seen and evaluated -Applied Prisma dressing to  wound bed and dry sterile dressing to surgical site left hallux secured with coban wrap and stockinet  -Advised patient to do the same with finishing Prisma at home -Advised to monitor for signs of infection, If occurs to return to office or go to ER immediately  -Advised patient to continue with post-op shoe on left foot -Advised patient to limit activity to necessity  -Advised patient to elevate as necessary  -Refilled Norco to take as needed for pain; Pain management consult placed -Continue with Lyrica for neuropathy -Awaiting extra depth shoes -Return in 2-3 weeks for follow up care. In the meantime, patient to call office if any issues or problems arise.   [name], DPM

## 2016-09-29 NOTE — Telephone Encounter (Addendum)
-----   Message from [place], [place] sent at 09/28/2016 12:46 PM EDT ----- Regarding: Pain management Chronic neuropathy pain and is healing post op toe surgery for wound still requiring pain medications. Please see patient to recommend a safe pain control regimen in the setting of neuropathy -Dr. Cannon Kettle. 09/29/2016-Faxed referral, pt demographics and clinicals to [facility].

## 2016-10-12 ENCOUNTER — Encounter: Payer: Self-pay | Admitting: Sports Medicine

## 2016-10-12 ENCOUNTER — Ambulatory Visit (INDEPENDENT_AMBULATORY_CARE_PROVIDER_SITE_OTHER): Payer: Medicare Other | Admitting: Sports Medicine

## 2016-10-12 DIAGNOSIS — L97521 Non-pressure chronic ulcer of other part of left foot limited to breakdown of skin: Secondary | ICD-10-CM | POA: Diagnosis not present

## 2016-10-12 DIAGNOSIS — G629 Polyneuropathy, unspecified: Secondary | ICD-10-CM

## 2016-10-12 DIAGNOSIS — M79672 Pain in left foot: Secondary | ICD-10-CM

## 2016-10-12 DIAGNOSIS — Z9889 Other specified postprocedural states: Secondary | ICD-10-CM

## 2016-10-12 MED ORDER — HYDROCODONE-ACETAMINOPHEN 10-325 MG PO TABS: 1.0000 | ORAL_TABLET | Freq: Four times a day (QID) | ORAL | 0 refills | Status: DC | PRN

## 2016-10-12 NOTE — Progress Notes (Signed)
Patient ID: [name], male   DOB: 1956/05/06, 60 y.o.   MRN: [id number] Subjective: [name] is a 60 y.o. male patient seen today in office for POV #9 (DOS 05-10-16), S/P Excision of wart with laser destruction, bone biopsy, and partial closure, Patient states changing dressing every other day using Prisma, admits pain at surgical site especially when he does a lot of walking and standing and pain up leg related to neuropathy;controlled with Norco and lyrica; denies calf pain, denies headache, chest pain, shortness of breath, nausea, vomiting, fever, or chills.  No other issues noted.   Patient Active Problem List   Diagnosis Date Noted  . Toe ulcer ([place]) 03/08/2016  . Verruca 12/28/2015  . Pre-ulcerative calluses 12/28/2015  . Hx of adenomatous colonic polyps 06/09/2015  . Lipoma 03/13/2014  . Plantar warts 07/09/2012  . Cellulitis 07/04/2012  . Neuropathy ([place]) 07/04/2012  . Hyperglycemia 07/04/2012  . HYPERTENSION, BENIGN 10/20/2010  . EDEMA 08/20/2010  . WISDOM TEETH EXTRACTION, HX OF 08/20/2010  . HYPOALBUMINEMIA 08/19/2010  . ANEMIA 08/19/2010  . NICOTINE ADDICTION 08/19/2010  . NECROTIZING PNEUMONIA 08/19/2010  . OSTEOARTHRITIS 08/19/2010    Current Outpatient Prescriptions on File Prior to Visit  Medication Sig Dispense Refill  . atorvastatin (LIPITOR) 10 MG tablet Take 10 mg by mouth at bedtime.  5  . buPROPion (WELLBUTRIN SR) 150 MG 12 hr tablet Take 150 mg by mouth 2 (two) times daily.    . cephALEXin (KEFLEX) 500 MG capsule Take 1 capsule (500 mg total) by mouth 3 (three) times daily. 30 capsule 2  . CHANTIX CONTINUING MONTH PAK 1 MG tablet TAKE AS PER PACKAGE DIRECTIONS  1  . CHANTIX STARTING MONTH PAK 0.5 MG X 11 & 1 MG X 42 tablet TAKE AS PRESCRIBED  0  . docusate sodium (COLACE) 100 MG capsule TAKE ONE CAPSULE BY MOUTH EVERY 6 HOURS AS NEEDED FOR CONSTIPATION  0  . docusate sodium (COLACE) 100 MG capsule Take 100 mg by mouth daily as needed for mild  constipation.    . fluorouracil (EFUDEX) 5 % cream Apply topically 2 (two) times daily. 40 g 0  . gabapentin (NEURONTIN) 300 MG capsule Take 300 mg by mouth 3 (three) times daily.    . Multiple Vitamin (MULTIVITAMIN) tablet Take 1 tablet by mouth daily.    . mupirocin cream (BACTROBAN) 2 % Apply 1 application topically 2 (two) times daily. 15 g 0  . pregabalin (LYRICA) 150 MG capsule Take 1 capsule (150 mg total) by mouth 2 (two) times daily. 90 capsule 2  . PROAIR HFA 108 (90 Base) MCG/ACT inhaler     . promethazine (PHENERGAN) 25 MG tablet     . promethazine (PHENERGAN) 25 MG tablet Take 25 mg by mouth every 6 (six) hours as needed for nausea or vomiting.    Marland Kitchen SPIRIVA HANDIHALER 18 MCG inhalation capsule INHALE 1 CAPSULE VIA HANDIHALER ONCE DAILY AT THE SAME TIME EVERY DAY  5  . SYMBICORT 160-4.5 MCG/ACT inhaler     . Wound Dressings (CURASOL WOUND DRESSING) GEL Apply 1 application topically daily. 30 g 0   No current facility-administered medications on file prior to visit.     No Known Allergies  Objective: There were no vitals filed for this visit.  General: No acute distress, AAOx3  Left foot: Ulceration at hallux surgical site measures 0.2 x 0.2cm at distal opening and 0.2x0.2cm at proximal opening (Decreasing in size) with granular tissue present, with partial skin bridge at center  if ulceration bed, mild swelling, no erythema, no warmth, no drainage, no acute signs of infection noted, Capillary fill time <3 seconds in all digits,  No pain or crepitation with range of motion left foot. Neuropathy unchanged from prior. No pain with calf compression.    Assessment and Plan:  Problem List Items Addressed This Visit      Nervous and Auditory   Neuropathy ([place])    Other Visit Diagnoses    Left foot pain    -  Primary   Status post left foot surgery       Toe ulcer, left, limited to breakdown of skin ([place])         -Patient seen and evaluated -Applied Prisma dressing to wound  bed and dry sterile dressing to surgical site left hallux secured with coban wrap and stockinet  -Advised patient to do the same with finishing Prisma at home -Advised to monitor for signs of infection, If occurs to return to office or go to ER immediately  -Advised patient to continue with post-op shoe on left foot -Advised patient to limit activity to necessity  -Advised patient to elevate as necessary  -Refilled Norco to take as needed for pain; Pain management consult placed- awaiting recs -Continue with Lyrica for neuropathy -Awaiting extra depth shoes -Return in 2-3 weeks for follow up care. In the meantime, patient to call office if any issues or problems arise.   [name], DPM

## 2016-10-13 ENCOUNTER — Other Ambulatory Visit: Payer: Self-pay | Admitting: Acute Care

## 2016-10-13 DIAGNOSIS — F17210 Nicotine dependence, cigarettes, uncomplicated: Secondary | ICD-10-CM

## 2016-11-02 ENCOUNTER — Ambulatory Visit (INDEPENDENT_AMBULATORY_CARE_PROVIDER_SITE_OTHER): Payer: Medicare Other | Admitting: Sports Medicine

## 2016-11-02 ENCOUNTER — Encounter: Payer: Self-pay | Admitting: Sports Medicine

## 2016-11-02 DIAGNOSIS — Z89411 Acquired absence of right great toe: Secondary | ICD-10-CM

## 2016-11-02 DIAGNOSIS — L97521 Non-pressure chronic ulcer of other part of left foot limited to breakdown of skin: Secondary | ICD-10-CM

## 2016-11-02 DIAGNOSIS — M79672 Pain in left foot: Secondary | ICD-10-CM

## 2016-11-02 DIAGNOSIS — Z9889 Other specified postprocedural states: Secondary | ICD-10-CM

## 2016-11-02 DIAGNOSIS — G629 Polyneuropathy, unspecified: Secondary | ICD-10-CM

## 2016-11-02 MED ORDER — HYDROCODONE-ACETAMINOPHEN 10-325 MG PO TABS: 1.0000 | ORAL_TABLET | Freq: Four times a day (QID) | ORAL | 0 refills | Status: DC | PRN

## 2016-11-02 NOTE — Progress Notes (Signed)
Patient ID: [name], male   DOB: 08-23-1956, 60 y.o.   MRN: [id number] Subjective: [name] is a 60 y.o. male patient seen today in office for POV #10 (DOS 05-10-16), S/P Excision of wart with laser destruction, bone biopsy, and partial closure, Patient states changing dressing every other day using Prisma, admits to increased bloody drainage and pain at surgical site and pain up leg related to neuropathy;controlled with Norco and lyrica; denies calf pain, denies headache, chest pain, shortness of breath, nausea, vomiting, fever, or chills.  No other issues noted.   Patient Active Problem List   Diagnosis Date Noted  . Toe ulcer ([place]) 03/08/2016  . Verruca 12/28/2015  . Pre-ulcerative calluses 12/28/2015  . Hx of adenomatous colonic polyps 06/09/2015  . Lipoma 03/13/2014  . Plantar warts 07/09/2012  . Cellulitis 07/04/2012  . Neuropathy ([place]) 07/04/2012  . Hyperglycemia 07/04/2012  . HYPERTENSION, BENIGN 10/20/2010  . EDEMA 08/20/2010  . WISDOM TEETH EXTRACTION, HX OF 08/20/2010  . HYPOALBUMINEMIA 08/19/2010  . ANEMIA 08/19/2010  . NICOTINE ADDICTION 08/19/2010  . NECROTIZING PNEUMONIA 08/19/2010  . OSTEOARTHRITIS 08/19/2010    Current Outpatient Prescriptions on File Prior to Visit  Medication Sig Dispense Refill  . atorvastatin (LIPITOR) 10 MG tablet Take 10 mg by mouth at bedtime.  5  . buPROPion (WELLBUTRIN SR) 150 MG 12 hr tablet Take 150 mg by mouth 2 (two) times daily.    . cephALEXin (KEFLEX) 500 MG capsule Take 1 capsule (500 mg total) by mouth 3 (three) times daily. 30 capsule 2  . CHANTIX CONTINUING MONTH PAK 1 MG tablet TAKE AS PER PACKAGE DIRECTIONS  1  . CHANTIX STARTING MONTH PAK 0.5 MG X 11 & 1 MG X 42 tablet TAKE AS PRESCRIBED  0  . docusate sodium (COLACE) 100 MG capsule TAKE ONE CAPSULE BY MOUTH EVERY 6 HOURS AS NEEDED FOR CONSTIPATION  0  . docusate sodium (COLACE) 100 MG capsule Take 100 mg by mouth daily as needed for mild constipation.    .  fluorouracil (EFUDEX) 5 % cream Apply topically 2 (two) times daily. 40 g 0  . gabapentin (NEURONTIN) 300 MG capsule Take 300 mg by mouth 3 (three) times daily.    . Multiple Vitamin (MULTIVITAMIN) tablet Take 1 tablet by mouth daily.    . mupirocin cream (BACTROBAN) 2 % Apply 1 application topically 2 (two) times daily. 15 g 0  . pregabalin (LYRICA) 150 MG capsule Take 1 capsule (150 mg total) by mouth 2 (two) times daily. 90 capsule 2  . PROAIR HFA 108 (90 Base) MCG/ACT inhaler     . promethazine (PHENERGAN) 25 MG tablet     . promethazine (PHENERGAN) 25 MG tablet Take 25 mg by mouth every 6 (six) hours as needed for nausea or vomiting.    Marland Kitchen SPIRIVA HANDIHALER 18 MCG inhalation capsule INHALE 1 CAPSULE VIA HANDIHALER ONCE DAILY AT THE SAME TIME EVERY DAY  5  . SYMBICORT 160-4.5 MCG/ACT inhaler     . Wound Dressings (CURASOL WOUND DRESSING) GEL Apply 1 application topically daily. 30 g 0   No current facility-administered medications on file prior to visit.     No Known Allergies  Objective: There were no vitals filed for this visit.  General: No acute distress, AAOx3  Left foot: Ulceration at hallux surgical site measures 0.5 x 0.2cm at distal opening and  proximal opening is healed with small laceration with granular tissue present, mild maceration, decreased swelling, no erythema, no warmth, bloody drainage,  no acute signs of infection noted, Capillary fill time <3 seconds in all digits,  No pain or crepitation with range of motion left foot. Neuropathy unchanged from prior. No pain with calf compression.    Assessment and Plan:  Problem List Items Addressed This Visit      Nervous and Auditory   Neuropathy ([place])    Other Visit Diagnoses    Toe ulcer, left, limited to breakdown of skin ([place])    -  Primary   Status post left foot surgery       Left foot pain       Status post amputation of great toe, right ([place])         -Patient seen and evaluated -Applied Prisma dressing to  wound bed and dry sterile dressing to surgical site left hallux secured with coban wrap and stockinet  -Advised patient to do the same with finishing Prisma at home every day since there was some maceration at today's visit -Advised to monitor for signs of infection, If occurs to return to office or go to ER immediately  -Advised patient to continue with post-op shoe on left foot -Advised patient to limit activity to necessity  -Advised patient to elevate as necessary  -Refilled Norco to take as needed for pain; Pain management consult placed; awaiting recs -Continue with Lyrica for neuropathy -Awaiting extra depth shoes -Return in 2  weeks for follow up care. In the meantime, patient to call office if any issues or problems arise.   [name], DPM

## 2016-11-03 ENCOUNTER — Telehealth: Payer: Self-pay | Admitting: Acute Care

## 2016-11-03 NOTE — Telephone Encounter (Signed)
I called [name] to make sure he had received the results of his LDCT and to confirm that we will order and schedule his annual screening scan 03/2017. There was no answer. I left a message informing him we will schedule and order his annual screening in 03/2017 with our contact information in the event he needed to call with any questions.

## 2016-11-08 ENCOUNTER — Ambulatory Visit (INDEPENDENT_AMBULATORY_CARE_PROVIDER_SITE_OTHER): Payer: Medicare Other | Admitting: Orthopedic Surgery

## 2016-11-08 VITALS — Ht 79.0 in | Wt 348.0 lb

## 2016-11-08 DIAGNOSIS — M545 Low back pain, unspecified: Secondary | ICD-10-CM | POA: Insufficient documentation

## 2016-11-08 DIAGNOSIS — G8929 Other chronic pain: Secondary | ICD-10-CM | POA: Diagnosis not present

## 2016-11-08 MED ORDER — PREDNISONE 10 MG PO TABS: 20.0000 mg | ORAL_TABLET | Freq: Every day | ORAL | 0 refills | Status: DC

## 2016-11-08 NOTE — Progress Notes (Signed)
Office Visit Note   Patient: [name]           Date of Birth: November 02, 1956           MRN: [id number] Visit Date: 11/08/2016              Requested by: [name], MD [place], [place] [address] PCP: [name], MD 2  Assessment & Plan: Visit Diagnoses:  1. Chronic left-sided low back pain without sciatica     Plan: Patient has tried heat and anti-inflammatories without relief still has chronic lower back pain which is better with sitting better with flexion consistent with stenosis. We will start him on a prednisone taper 20 mg a day for 2 weeks followed by 10 mg a day for 2 weeks. Discussed we may need to consider an MRI scan for possible epidural steroid injection.  Follow-Up Instructions: Return in about 4 weeks (around 12/06/2016).   Orders:  No orders of the defined types were placed in this encounter.  No orders of the defined types were placed in this encounter.     Procedures: No procedures performed   Clinical Data: No additional findings.   Subjective: Chief Complaint  Patient presents with  . Lower Back - Pain    Without injury or prior work up    Pt has x rays for review of the T spine and L spine. From 03/2016. He states that he has degenerative disc disease and that he has not had an previous work up for this or treatment. Patient states that it took a very long time to get this referral. He ambulates with a cane for stability.    Review of Systems   Objective: Vital Signs: Ht 6\' 7"  (2.007 m)   Wt (!) 348 lb (157.9 kg)   BMI 39.20 kg/m   Physical Exam on examination patient is alert oriented no adenopathy well-dressed the left thigh, respiratory effort he has an antalgic gait using a cane for ambulation. Examination he has a negative straight leg raise bilaterally with no focal motor weakness in either lower extremity. His radiographs were reviewed which shows osteophytic bone spurs throughout the lumbar spine.  Ortho  Exam  Specialty Comments:  No specialty comments available.  Imaging: No results found.   PMFS History: Patient Active Problem List   Diagnosis Date Noted  . Chronic left-sided low back pain without sciatica 11/08/2016  . Toe ulcer ([place]) 03/08/2016  . Verruca 12/28/2015  . Pre-ulcerative calluses 12/28/2015  . Hx of adenomatous colonic polyps 06/09/2015  . Lipoma 03/13/2014  . Plantar warts 07/09/2012  . Cellulitis 07/04/2012  . Neuropathy ([place]) 07/04/2012  . Hyperglycemia 07/04/2012  . HYPERTENSION, BENIGN 10/20/2010  . EDEMA 08/20/2010  . WISDOM TEETH EXTRACTION, HX OF 08/20/2010  . HYPOALBUMINEMIA 08/19/2010  . ANEMIA 08/19/2010  . NICOTINE ADDICTION 08/19/2010  . NECROTIZING PNEUMONIA 08/19/2010  . OSTEOARTHRITIS 08/19/2010   Past Medical History:  Diagnosis Date  . Hx of adenomatous colonic polyps 06/09/2015  . Hyperlipidemia    no mediciations  . Hypertension    has previously been on medication but then taken off  . Neuromuscular disorder (H[facility])    neuropathy lower legs and feet  . Neuropathy ([place])    in both feet  . Pneumonia 2011    Family History  Problem Relation Age of Onset  . Heart disease Mother   . Cancer Father   . Diabetes Father   . Pancreatic cancer Father   . Cancer  Sister   . Colon cancer Neg Hx   . Rectal cancer Neg Hx   . Stomach cancer Neg Hx   . Colon polyps Neg Hx     Past Surgical History:  Procedure Laterality Date  . AMPUTATION Bilateral 06/05/2013   Procedure: RIGHT GREAT TOE AMPUTATION/LEFT GREAT TOE DEBRIDEMENT;  Surgeon: Wylene Simmer, MD;  Location: [facility];  Service: Orthopedics;  Laterality: Bilateral;  . MOUTH SURGERY    . TOE AMPUTATION Right 06/05/2013   RIGHT GREAT TOE PARTIAL AMPUTATION    . TOOTH EXTRACTION  2011   multiple teeth extracted   Social History   Occupational History  . Not on file.   Social History Main Topics  . Smoking status: Current Some Day Smoker    Packs/day: 0.20    Years: 40.00    Types:  Cigarettes    Start date: 11/29/1973  . Smokeless tobacco: Never Used     Comment: occasional smoker - ~1 pack/week. smoked ~1.5ppd until 5 yr ago  . Alcohol use No  . Drug use: No  . Sexual activity: Not on file

## 2016-11-16 ENCOUNTER — Encounter: Payer: Self-pay | Admitting: Sports Medicine

## 2016-11-16 ENCOUNTER — Ambulatory Visit (INDEPENDENT_AMBULATORY_CARE_PROVIDER_SITE_OTHER): Payer: Medicare Other | Admitting: Sports Medicine

## 2016-11-16 DIAGNOSIS — L97521 Non-pressure chronic ulcer of other part of left foot limited to breakdown of skin: Secondary | ICD-10-CM

## 2016-11-16 DIAGNOSIS — Z9889 Other specified postprocedural states: Secondary | ICD-10-CM

## 2016-11-16 DIAGNOSIS — M79672 Pain in left foot: Secondary | ICD-10-CM

## 2016-11-16 DIAGNOSIS — G629 Polyneuropathy, unspecified: Secondary | ICD-10-CM

## 2016-11-16 MED ORDER — HYDROCODONE-ACETAMINOPHEN 10-325 MG PO TABS: 1.0000 | ORAL_TABLET | Freq: Four times a day (QID) | ORAL | 0 refills | Status: DC | PRN

## 2016-11-16 NOTE — Progress Notes (Signed)
Patient ID: [name], male   DOB: 1956-03-28, 60 y.o.   MRN: [id number] Subjective: [name] is a 60 y.o. male patient seen today in office for POV #11 (DOS 05-10-16), S/P Excision of wart with laser destruction, bone biopsy, and partial closure, Patient states changing dressing every other day using Prisma, admits to bloody drainage and pain at surgical site and pain up leg related to neuropathy;controlled with Norco and lyrica; denies calf pain, denies headache, chest pain, shortness of breath, nausea, vomiting, fever, or chills.  No other issues noted.   Patient Active Problem List   Diagnosis Date Noted  . Chronic left-sided low back pain without sciatica 11/08/2016  . Toe ulcer ([place]) 03/08/2016  . Verruca 12/28/2015  . Pre-ulcerative calluses 12/28/2015  . Hx of adenomatous colonic polyps 06/09/2015  . Lipoma 03/13/2014  . Plantar warts 07/09/2012  . Cellulitis 07/04/2012  . Neuropathy ([place]) 07/04/2012  . Hyperglycemia 07/04/2012  . HYPERTENSION, BENIGN 10/20/2010  . EDEMA 08/20/2010  . WISDOM TEETH EXTRACTION, HX OF 08/20/2010  . HYPOALBUMINEMIA 08/19/2010  . ANEMIA 08/19/2010  . NICOTINE ADDICTION 08/19/2010  . NECROTIZING PNEUMONIA 08/19/2010  . OSTEOARTHRITIS 08/19/2010    Current Outpatient Prescriptions on File Prior to Visit  Medication Sig Dispense Refill  . atorvastatin (LIPITOR) 10 MG tablet Take 10 mg by mouth at bedtime.  5  . buPROPion (WELLBUTRIN SR) 150 MG 12 hr tablet Take 150 mg by mouth 2 (two) times daily.    . cephALEXin (KEFLEX) 500 MG capsule Take 1 capsule (500 mg total) by mouth 3 (three) times daily. (Patient not taking: Reported on 11/08/2016) 30 capsule 2  . CHANTIX CONTINUING MONTH PAK 1 MG tablet TAKE AS PER PACKAGE DIRECTIONS  1  . CHANTIX STARTING MONTH PAK 0.5 MG X 11 & 1 MG X 42 tablet TAKE AS PRESCRIBED  0  . docusate sodium (COLACE) 100 MG capsule TAKE ONE CAPSULE BY MOUTH EVERY 6 HOURS AS NEEDED FOR CONSTIPATION  0  . docusate  sodium (COLACE) 100 MG capsule Take 100 mg by mouth daily as needed for mild constipation.    . fluorouracil (EFUDEX) 5 % cream Apply topically 2 (two) times daily. (Patient not taking: Reported on 11/08/2016) 40 g 0  . gabapentin (NEURONTIN) 300 MG capsule Take 300 mg by mouth 3 (three) times daily.    . Multiple Vitamin (MULTIVITAMIN) tablet Take 1 tablet by mouth daily.    . mupirocin cream (BACTROBAN) 2 % Apply 1 application topically 2 (two) times daily. (Patient not taking: Reported on 11/08/2016) 15 g 0  . predniSONE (DELTASONE) 10 MG tablet Take 2 tablets (20 mg total) by mouth daily with breakfast. Take 20 mg a day for 2 weeks and then take 10 mg a day for 2 weeks. 60 tablet 0  . pregabalin (LYRICA) 150 MG capsule Take 1 capsule (150 mg total) by mouth 2 (two) times daily. 90 capsule 2  . PROAIR HFA 108 (90 Base) MCG/ACT inhaler     . promethazine (PHENERGAN) 25 MG tablet     . promethazine (PHENERGAN) 25 MG tablet Take 25 mg by mouth every 6 (six) hours as needed for nausea or vomiting.    Marland Kitchen SPIRIVA HANDIHALER 18 MCG inhalation capsule INHALE 1 CAPSULE VIA HANDIHALER ONCE DAILY AT THE SAME TIME EVERY DAY  5  . SYMBICORT 160-4.5 MCG/ACT inhaler     . Wound Dressings (CURASOL WOUND DRESSING) GEL Apply 1 application topically daily. (Patient not taking: Reported on 11/08/2016) 30 g 0  No current facility-administered medications on file prior to visit.     No Known Allergies  Objective: There were no vitals filed for this visit.  General: No acute distress, AAOx3  Left foot: Ulceration at hallux surgical site measures 0.5 x 0.5cm at distal opening and  proximal opening is Continued to be healed with small laceration with granular tissue present that is now resolved, no maceration, decreased swelling, no erythema, no warmth, bloody drainage, no acute signs of infection noted, Capillary fill time <3 seconds in all digits,  No pain or crepitation with range of motion left foot. Neuropathy  unchanged from prior. No pain with calf compression.    Assessment and Plan:  Problem List Items Addressed This Visit      Nervous and Auditory   Neuropathy ([place])    Other Visit Diagnoses    Toe ulcer, left, limited to breakdown of skin ([place])    -  Primary   Status post left foot surgery       Left foot pain         -Patient seen and evaluated -Debrided ulceration using tissue nipper and Applied Prisma dressing to wound bed and dry sterile dressing to surgical site left hallux secured with coban wrap and stockinet  -Advised patient to do the same with finishing Prisma at home every day since there was some maceration at today's visit -Advised to monitor for signs of infection, If occurs to return to office or go to ER immediately  -Advised patient to continue with post-op shoe on left foot -Advised patient to limit activity to necessity  -Advised patient to elevate as necessary  -Refilled Norco to take as needed for pain; Pain management consult placed; awaiting recsAs previous -Continue with Lyrica for neuropathy -Awaiting extra depth shoes -Return in 2-3  weeks for follow up care. In the meantime, patient to call office if any issues or problems arise.   [name], DPM

## 2016-11-30 ENCOUNTER — Ambulatory Visit (INDEPENDENT_AMBULATORY_CARE_PROVIDER_SITE_OTHER): Payer: Medicare Other | Admitting: Sports Medicine

## 2016-11-30 ENCOUNTER — Encounter: Payer: Self-pay | Admitting: Sports Medicine

## 2016-11-30 DIAGNOSIS — Z9889 Other specified postprocedural states: Secondary | ICD-10-CM

## 2016-11-30 DIAGNOSIS — L97521 Non-pressure chronic ulcer of other part of left foot limited to breakdown of skin: Secondary | ICD-10-CM | POA: Diagnosis not present

## 2016-11-30 DIAGNOSIS — B070 Plantar wart: Secondary | ICD-10-CM | POA: Diagnosis not present

## 2016-11-30 DIAGNOSIS — G629 Polyneuropathy, unspecified: Secondary | ICD-10-CM

## 2016-11-30 DIAGNOSIS — M79672 Pain in left foot: Secondary | ICD-10-CM

## 2016-11-30 DIAGNOSIS — Z89411 Acquired absence of right great toe: Secondary | ICD-10-CM

## 2016-11-30 MED ORDER — MUPIROCIN CALCIUM 2 % EX CREA: 1.0000 "application " | TOPICAL_CREAM | Freq: Two times a day (BID) | CUTANEOUS | 0 refills | Status: DC

## 2016-11-30 MED ORDER — HYDROCODONE-ACETAMINOPHEN 10-325 MG PO TABS: 1.0000 | ORAL_TABLET | Freq: Four times a day (QID) | ORAL | 0 refills | Status: DC | PRN

## 2016-11-30 NOTE — Progress Notes (Signed)
Patient ID: [name], male   DOB: February 20, 1956, 61 y.o.   MRN: [id number] Subjective: [name] is a 61 y.o. male patient seen today in office for POV #12 (DOS 05-10-16), S/P Excision of wart with laser destruction, bone biopsy, and partial closure, Patient states changing dressing every other day using Prisma, admits to bloody drainage and pain at surgical site and pain up leg and to toe related to neuropathy;controlled with Norco and lyrica; denies calf pain, denies headache, chest pain, shortness of breath, nausea, vomiting, fever, or chills. No other issues noted.   Patient Active Problem List   Diagnosis Date Noted  . Chronic left-sided low back pain without sciatica 11/08/2016  . Toe ulcer ([place]) 03/08/2016  . Verruca 12/28/2015  . Pre-ulcerative calluses 12/28/2015  . Hx of adenomatous colonic polyps 06/09/2015  . Lipoma 03/13/2014  . Plantar warts 07/09/2012  . Cellulitis 07/04/2012  . Neuropathy ([place]) 07/04/2012  . Hyperglycemia 07/04/2012  . HYPERTENSION, BENIGN 10/20/2010  . EDEMA 08/20/2010  . WISDOM TEETH EXTRACTION, HX OF 08/20/2010  . HYPOALBUMINEMIA 08/19/2010  . ANEMIA 08/19/2010  . NICOTINE ADDICTION 08/19/2010  . NECROTIZING PNEUMONIA 08/19/2010  . OSTEOARTHRITIS 08/19/2010    Current Outpatient Prescriptions on File Prior to Visit  Medication Sig Dispense Refill  . atorvastatin (LIPITOR) 10 MG tablet Take 10 mg by mouth at bedtime.  5  . buPROPion (WELLBUTRIN SR) 150 MG 12 hr tablet Take 150 mg by mouth 2 (two) times daily.    . cephALEXin (KEFLEX) 500 MG capsule Take 1 capsule (500 mg total) by mouth 3 (three) times daily. (Patient not taking: Reported on 11/08/2016) 30 capsule 2  . CHANTIX CONTINUING MONTH PAK 1 MG tablet TAKE AS PER PACKAGE DIRECTIONS  1  . CHANTIX STARTING MONTH PAK 0.5 MG X 11 & 1 MG X 42 tablet TAKE AS PRESCRIBED  0  . docusate sodium (COLACE) 100 MG capsule TAKE ONE CAPSULE BY MOUTH EVERY 6 HOURS AS NEEDED FOR CONSTIPATION  0  .  docusate sodium (COLACE) 100 MG capsule Take 100 mg by mouth daily as needed for mild constipation.    . fluorouracil (EFUDEX) 5 % cream Apply topically 2 (two) times daily. (Patient not taking: Reported on 11/08/2016) 40 g 0  . gabapentin (NEURONTIN) 300 MG capsule Take 300 mg by mouth 3 (three) times daily.    . Multiple Vitamin (MULTIVITAMIN) tablet Take 1 tablet by mouth daily.    . predniSONE (DELTASONE) 10 MG tablet Take 2 tablets (20 mg total) by mouth daily with breakfast. Take 20 mg a day for 2 weeks and then take 10 mg a day for 2 weeks. 60 tablet 0  . pregabalin (LYRICA) 150 MG capsule Take 1 capsule (150 mg total) by mouth 2 (two) times daily. 90 capsule 2  . PROAIR HFA 108 (90 Base) MCG/ACT inhaler     . promethazine (PHENERGAN) 25 MG tablet     . promethazine (PHENERGAN) 25 MG tablet Take 25 mg by mouth every 6 (six) hours as needed for nausea or vomiting.    Marland Kitchen SPIRIVA HANDIHALER 18 MCG inhalation capsule INHALE 1 CAPSULE VIA HANDIHALER ONCE DAILY AT THE SAME TIME EVERY DAY  5  . SYMBICORT 160-4.5 MCG/ACT inhaler     . Wound Dressings (CURASOL WOUND DRESSING) GEL Apply 1 application topically daily. (Patient not taking: Reported on 11/08/2016) 30 g 0   No current facility-administered medications on file prior to visit.     No Known Allergies  Objective: There were  no vitals filed for this visit.  General: No acute distress, AAOx3  Left foot: Abrasion to left 2nd toe dorsal PIPJ with no signs of infection. Ulceration at hallux surgical site measures 0.4 x 0.5cm at distal opening with granular tissue present with surrounding keratosis with possible recurrence of wart , no maceration, decreased swelling, no erythema, no warmth, bloody drainage, no acute signs of infection noted, Capillary fill time <3 seconds in all digits,  No pain or crepitation with range of motion left foot. Neuropathy unchanged from prior. No pain with calf compression.    Assessment and Plan:  Problem List  Items Addressed This Visit      Nervous and Auditory   Neuropathy ([place])    Other Visit Diagnoses    Toe ulcer, left, limited to breakdown of skin ([place])    -  Primary   Relevant Medications   mupirocin cream (BACTROBAN) 2 %   Plantar wart       Relevant Medications   mupirocin cream (BACTROBAN) 2 %   Status post left foot surgery       Left foot pain       Status post amputation of great toe, right ([facility])         -Patient seen and evaluated -Debrided ulceration using tissue nipper and Applied Cathrone periwound and Prisma dressing to wound bed and dry sterile dressing to surgical site left hallux secured with coban wrap and stockinet  -Advised patient to do the same with finishing Prisma at home every day since there was some maceration at today's visit -Applied antibiotic cream to left 2nd toe -Advised to monitor for signs of infection, If occurs to return to office or go to ER immediately  -Advised patient to continue with post-op shoe on left foot -Advised patient to limit activity to necessity  -Advised patient to elevate as necessary  -Refilled Norco to take as needed for pain; Pain management consult placed; awaiting recs as previous -Continue with Lyrica for neuropathy -Return in 2-3  weeks for follow up care. In the meantime, patient to call office if any issues or problems arise.   [name], DPM

## 2016-12-02 ENCOUNTER — Telehealth: Payer: Self-pay | Admitting: *Deleted

## 2016-12-02 DIAGNOSIS — G629 Polyneuropathy, unspecified: Secondary | ICD-10-CM

## 2016-12-02 NOTE — Telephone Encounter (Addendum)
-----   Message from [name], [place] sent at 11/30/2016  7:21 PM EST ----- Regarding: Pain Management Consult Hi [name] can we follow up with Pain management consult; Patient has neuropathy and is currently managed with Lyrica and Norco and needs long term management Thanks Dr. Cannon Kettle. 12/02/2016-Faxed required form, clinicals and Demographics to [facility]. 12/14/2016-Dr. Cannon Kettle ordered [facility]ih[facility] for left hallux distal 0.3 x 0.1 x 0.1cm and proximal 0.5 x 0.5 x 0.1cm ulcers L97.521, Z98.890 with no drainage faxed to [facility].

## 2016-12-06 ENCOUNTER — Encounter (INDEPENDENT_AMBULATORY_CARE_PROVIDER_SITE_OTHER): Payer: Self-pay

## 2016-12-06 ENCOUNTER — Ambulatory Visit (INDEPENDENT_AMBULATORY_CARE_PROVIDER_SITE_OTHER): Payer: Medicare Other | Admitting: Family

## 2016-12-06 DIAGNOSIS — G8929 Other chronic pain: Secondary | ICD-10-CM

## 2016-12-06 DIAGNOSIS — M545 Low back pain, unspecified: Secondary | ICD-10-CM

## 2016-12-06 NOTE — Progress Notes (Signed)
Office Visit Note   Patient: [name]           Date of Birth: 02-23-1956           MRN: [id number] Visit Date: 12/06/2016              Requested by: [name], MD [place], [place] [address] PCP: [name], MD  Chief Complaint  Patient presents with  . Spine - Pain    HPI: The patient is a 61 year old gentleman who is seen today in follow-up for low back pain with left-sided radiculopathy. Has completed a prednisone Dosepak. States the first 3 days of taking this he did feel some relief. The pain has now returned. Has some Vicodin 10 -325mg  that he isn't taking for his foot feels this has not really been helping his back very well much.     Assessment & Plan: Visit Diagnoses:  1. Chronic left-sided low back pain without sciatica     Plan: We'll order an MRI of the lumbar spine. He will follow-up pending the results with Dr. Ernestina Patches for [facility].  Follow-Up Instructions: Return for p mri with [facility].   Exam: Patient is alert and oriented. No adenopathy. Well-dressed. Normal affect. Respirations easy.  Back Exam   Tenderness  The patient is experiencing tenderness in the lumbar.  Range of Motion  The patient has normal back ROM.  Muscle Strength  The patient has normal back strength.  Other  Gait: normal        Imaging: No results found.  Orders:  Orders Placed This Encounter  Procedures  . MR Lumbar Spine w/o contrast   No orders of the defined types were placed in this encounter.    Procedures: No procedures performed  Clinical Data: No additional findings.  Subjective: Review of Systems  Objective: Vital Signs: There were no vitals taken for this visit.  Specialty Comments:  No specialty comments available.  PMFS History: Patient Active Problem List   Diagnosis Date Noted  . Chronic left-sided low back pain without sciatica 11/08/2016  . Toe ulcer ([place]) 03/08/2016  . Verruca 12/28/2015  . Pre-ulcerative  calluses 12/28/2015  . Hx of adenomatous colonic polyps 06/09/2015  . Lipoma 03/13/2014  . Plantar warts 07/09/2012  . Cellulitis 07/04/2012  . Neuropathy ([place]) 07/04/2012  . Hyperglycemia 07/04/2012  . HYPERTENSION, BENIGN 10/20/2010  . EDEMA 08/20/2010  . WISDOM TEETH EXTRACTION, HX OF 08/20/2010  . HYPOALBUMINEMIA 08/19/2010  . ANEMIA 08/19/2010  . NICOTINE ADDICTION 08/19/2010  . NECROTIZING PNEUMONIA 08/19/2010  . OSTEOARTHRITIS 08/19/2010   Past Medical History:  Diagnosis Date  . Hx of adenomatous colonic polyps 06/09/2015  . Hyperlipidemia    no mediciations  . Hypertension    has previously been on medication but then taken off  . Neuromuscular disorder (HCC)    neuropathy lower legs and feet  . Neuropathy ([place])    in both feet  . Pneumonia 2011    Family History  Problem Relation Age of Onset  . Heart disease Mother   . Cancer Father   . Diabetes Father   . Pancreatic cancer Father   . Cancer Sister   . Colon cancer Neg Hx   . Rectal cancer Neg Hx   . Stomach cancer Neg Hx   . Colon polyps Neg Hx     Past Surgical History:  Procedure Laterality Date  . AMPUTATION Bilateral 06/05/2013   Procedure: RIGHT GREAT TOE AMPUTATION/LEFT GREAT TOE DEBRIDEMENT;  Surgeon: Jenny Reichmann  Doran Durand, MD;  Location: [facility];  Service: Orthopedics;  Laterality: Bilateral;  . MOUTH SURGERY    . TOE AMPUTATION Right 06/05/2013   RIGHT GREAT TOE PARTIAL AMPUTATION    . TOOTH EXTRACTION  2011   multiple teeth extracted   Social History   Occupational History  . Not on file.   Social History Main Topics  . Smoking status: Current Some Day Smoker    Packs/day: 0.20    Years: 40.00    Types: Cigarettes    Start date: 11/29/1973  . Smokeless tobacco: Never Used     Comment: occasional smoker - ~1 pack/week. smoked ~1.5ppd until 5 yr ago  . Alcohol use No  . Drug use: No  . Sexual activity: Not on file

## 2016-12-08 ENCOUNTER — Telehealth (INDEPENDENT_AMBULATORY_CARE_PROVIDER_SITE_OTHER): Payer: Self-pay | Admitting: *Deleted

## 2016-12-08 NOTE — Telephone Encounter (Signed)
Pt has appt for MRI at [facility] on Jan 17 at 3p, pt is to arrive at 245p to register. Left message on vm to return my call to pt for appt information

## 2016-12-08 NOTE — Telephone Encounter (Signed)
Pt called back and aware of appt 

## 2016-12-10 ENCOUNTER — Telehealth (INDEPENDENT_AMBULATORY_CARE_PROVIDER_SITE_OTHER): Payer: Self-pay | Admitting: Family

## 2016-12-10 NOTE — Telephone Encounter (Signed)
Patient called needing a prescription for pain. Patient said he was told to call when he ran out of his previous Rx (hydrocodone) The number to contact him is [phone]

## 2016-12-10 NOTE — Telephone Encounter (Signed)
Advised no narcotics from our office he is receiving rx from Dr. Cannon Kettle. Advised that Dr. Sharol Given does not feel comfortable writing him additional narcotics.

## 2016-12-10 NOTE — Telephone Encounter (Signed)
Call patient. He is currently receiving narcotics from a another physician and we cannot prescribe additional narcotics.

## 2016-12-10 NOTE — Telephone Encounter (Signed)
Patient is requesting a rx for pain. He receives an rx for Norco 10/325 #30 from Dr. Cannon Kettle routinely and the last refill was 11/30/16 #30. He is sch for an MRI on 12/15/16 for eval of the lumbar pain tht he has been having and possible injections with Dr. Ernestina Patches. Please advise.

## 2016-12-14 ENCOUNTER — Encounter: Payer: Self-pay | Admitting: Sports Medicine

## 2016-12-14 ENCOUNTER — Ambulatory Visit (INDEPENDENT_AMBULATORY_CARE_PROVIDER_SITE_OTHER): Payer: Medicare Other | Admitting: Sports Medicine

## 2016-12-14 DIAGNOSIS — L97521 Non-pressure chronic ulcer of other part of left foot limited to breakdown of skin: Secondary | ICD-10-CM

## 2016-12-14 DIAGNOSIS — B070 Plantar wart: Secondary | ICD-10-CM

## 2016-12-14 DIAGNOSIS — Z9889 Other specified postprocedural states: Secondary | ICD-10-CM

## 2016-12-14 MED ORDER — HYDROCODONE-ACETAMINOPHEN 10-325 MG PO TABS: 1.0000 | ORAL_TABLET | Freq: Four times a day (QID) | ORAL | 0 refills | Status: DC | PRN

## 2016-12-14 MED ORDER — MEDIHONEY WOUND/BURN DRESSING EX GEL: CUTANEOUS | 3 refills | Status: DC

## 2016-12-14 NOTE — Progress Notes (Signed)
Patient ID: [name], male   DOB: [birth year], 61 y.o.   MRN: [id number] Subjective: [name] is a 61 y.o. male patient seen today in office for POV #13 (DOS 05-10-16), S/P Excision of wart with laser destruction, bone biopsy, and partial closure, Patient states changing dressing every other day using Prisma, admits to bloody drainage and pain at surgical site and pain up leg and to toe related to neuropathy;controlled with Norco and lyrica; denies calf pain, denies headache, chest pain, shortness of breath, nausea, vomiting, fever, or chills. No other issues noted.   Patient Active Problem List   Diagnosis Date Noted  . Chronic left-sided low back pain without sciatica 11/08/2016  . Toe ulcer ([place]) 03/08/2016  . Verruca 12/28/2015  . Pre-ulcerative calluses 12/28/2015  . Hx of adenomatous colonic polyps 06/09/2015  . Lipoma 03/13/2014  . Plantar warts 07/09/2012  . Cellulitis 07/04/2012  . Neuropathy ([place]) 07/04/2012  . Hyperglycemia 07/04/2012  . HYPERTENSION, BENIGN 10/20/2010  . EDEMA 08/20/2010  . WISDOM TEETH EXTRACTION, HX OF 08/20/2010  . HYPOALBUMINEMIA 08/19/2010  . ANEMIA 08/19/2010  . NICOTINE ADDICTION 08/19/2010  . NECROTIZING PNEUMONIA 08/19/2010  . OSTEOARTHRITIS 08/19/2010    Current Outpatient Prescriptions on File Prior to Visit  Medication Sig Dispense Refill  . atorvastatin (LIPITOR) 10 MG tablet Take 10 mg by mouth at bedtime.  5  . buPROPion (WELLBUTRIN SR) 150 MG 12 hr tablet Take 150 mg by mouth 2 (two) times daily.    . CHANTIX CONTINUING MONTH PAK 1 MG tablet TAKE AS PER PACKAGE DIRECTIONS  1  . CHANTIX STARTING MONTH PAK 0.5 MG X 11 & 1 MG X 42 tablet TAKE AS PRESCRIBED  0  . docusate sodium (COLACE) 100 MG capsule TAKE ONE CAPSULE BY MOUTH EVERY 6 HOURS AS NEEDED FOR CONSTIPATION  0  . docusate sodium (COLACE) 100 MG capsule Take 100 mg by mouth daily as needed for mild constipation.    . fluorouracil (EFUDEX) 5 % cream Apply topically 2 (two)  times daily. (Patient not taking: Reported on 11/08/2016) 40 g 0  . gabapentin (NEURONTIN) 300 MG capsule Take 300 mg by mouth 3 (three) times daily.    . Multiple Vitamin (MULTIVITAMIN) tablet Take 1 tablet by mouth daily.    . mupirocin cream (BACTROBAN) 2 % Apply 1 application topically 2 (two) times daily. 15 g 0  . predniSONE (DELTASONE) 10 MG tablet Take 2 tablets (20 mg total) by mouth daily with breakfast. Take 20 mg a day for 2 weeks and then take 10 mg a day for 2 weeks. 60 tablet 0  . pregabalin (LYRICA) 150 MG capsule Take 1 capsule (150 mg total) by mouth 2 (two) times daily. 90 capsule 2  . PROAIR HFA 108 (90 Base) MCG/ACT inhaler     . promethazine (PHENERGAN) 25 MG tablet     . promethazine (PHENERGAN) 25 MG tablet Take 25 mg by mouth every 6 (six) hours as needed for nausea or vomiting.    Marland Kitchen SPIRIVA HANDIHALER 18 MCG inhalation capsule INHALE 1 CAPSULE VIA HANDIHALER ONCE DAILY AT THE SAME TIME EVERY DAY  5  . SYMBICORT 160-4.5 MCG/ACT inhaler     . Wound Dressings (CURASOL WOUND DRESSING) GEL Apply 1 application topically daily. (Patient not taking: Reported on 11/08/2016) 30 g 0   No current facility-administered medications on file prior to visit.     No Known Allergies  Objective: There were no vitals filed for this visit.  General: No acute  distress, AAOx3  Left foot: Abrasion to left 2nd toe dorsal PIPJ with no signs of infection. Ulceration at hallux surgical site measures 0.3 x 0.1cm at distal opening and 0.5x0.5cm at proximal opening with granular tissue present with surrounding keratosis with possible recurrence of wart , no maceration, decreased swelling, no erythema, no warmth, bloody drainage, no acute signs of infection noted, Capillary fill time <3 seconds in all digits,  No pain or crepitation with range of motion left foot. Neuropathy unchanged from prior. No pain with calf compression.    Assessment and Plan:  Problem List Items Addressed This Visit       Other   Verruca    Other Visit Diagnoses    Toe ulcer, left, limited to breakdown of skin ([place])    -  Primary   Status post left foot surgery         -Patient seen and evaluated -Debrided ulceration using tissue nipper applied Prisma dressing to wound bed and dry sterile dressing to surgical site left hallux secured with coban wrap and stockinet  -Advised patient to do the same with finishing Prisma at home every day and to order Medihoney for patient to use daily  -Continue with applying antibiotic cream to left 2nd toe -Advised to monitor for signs of infection, If occurs to return to office or go to ER immediately  -Advised patient to continue with post-op shoe on left foot -Advised patient to limit activity to necessity  -Advised patient to elevate as necessary  -Refilled Norco to take as needed for pain; Pain management consult placed; awaiting recs as previous -Continue with Lyrica for neuropathy -Return in 2-3  weeks for follow up care. In the meantime, patient to call office if any issues or problems arise.   [name], DPM

## 2016-12-14 NOTE — Telephone Encounter (Signed)
-----   Message from [name], [place] sent at 12/14/2016 10:01 AM EST ----- Regarding: Medihoney Right toe ulcer x2  0.3x0.1cm distal  0.5x0.5cm proximal

## 2016-12-15 ENCOUNTER — Ambulatory Visit (HOSPITAL_COMMUNITY): Admission: RE | Admit: 2016-12-15 | Payer: Medicare Other | Source: Ambulatory Visit

## 2016-12-28 ENCOUNTER — Ambulatory Visit (INDEPENDENT_AMBULATORY_CARE_PROVIDER_SITE_OTHER): Payer: Medicare Other | Admitting: Sports Medicine

## 2016-12-28 ENCOUNTER — Encounter: Payer: Self-pay | Admitting: Sports Medicine

## 2016-12-28 DIAGNOSIS — B070 Plantar wart: Secondary | ICD-10-CM

## 2016-12-28 DIAGNOSIS — Z9889 Other specified postprocedural states: Secondary | ICD-10-CM

## 2016-12-28 DIAGNOSIS — L97521 Non-pressure chronic ulcer of other part of left foot limited to breakdown of skin: Secondary | ICD-10-CM

## 2016-12-28 MED ORDER — HYDROCODONE-ACETAMINOPHEN 10-325 MG PO TABS: 1.0000 | ORAL_TABLET | Freq: Four times a day (QID) | ORAL | 0 refills | Status: DC | PRN

## 2016-12-28 NOTE — Progress Notes (Signed)
Patient ID: [name], male   DOB: 1956-07-22, 61 y.o.   MRN: [id number] Subjective: [name] is a 61 y.o. male patient seen today in office for POV #14 (DOS 05-10-16), S/P Excision of wart with laser destruction, bone biopsy, and partial closure, Patient states changing dressing every other day using Prisma, admits to bloody drainage and pain at surgical site and pain up leg and to toe related to neuropathy;controlled with Norco and lyrica; states that he has been walking more and that his neuropathy is acting up; denies calf pain, denies headache, chest pain, shortness of breath, nausea, vomiting, fever, or chills. No other issues noted.   Patient Active Problem List   Diagnosis Date Noted  . Chronic left-sided low back pain without sciatica 11/08/2016  . Toe ulcer ([place]) 03/08/2016  . Verruca 12/28/2015  . Pre-ulcerative calluses 12/28/2015  . Hx of adenomatous colonic polyps 06/09/2015  . Lipoma 03/13/2014  . Plantar warts 07/09/2012  . Cellulitis 07/04/2012  . Neuropathy ([place]) 07/04/2012  . Hyperglycemia 07/04/2012  . HYPERTENSION, BENIGN 10/20/2010  . EDEMA 08/20/2010  . WISDOM TEETH EXTRACTION, HX OF 08/20/2010  . HYPOALBUMINEMIA 08/19/2010  . ANEMIA 08/19/2010  . NICOTINE ADDICTION 08/19/2010  . NECROTIZING PNEUMONIA 08/19/2010  . OSTEOARTHRITIS 08/19/2010    Current Outpatient Prescriptions on File Prior to Visit  Medication Sig Dispense Refill  . atorvastatin (LIPITOR) 10 MG tablet Take 10 mg by mouth at bedtime.  5  . buPROPion (WELLBUTRIN SR) 150 MG 12 hr tablet Take 150 mg by mouth 2 (two) times daily.    . CHANTIX CONTINUING MONTH PAK 1 MG tablet TAKE AS PER PACKAGE DIRECTIONS  1  . CHANTIX STARTING MONTH PAK 0.5 MG X 11 & 1 MG X 42 tablet TAKE AS PRESCRIBED  0  . docusate sodium (COLACE) 100 MG capsule TAKE ONE CAPSULE BY MOUTH EVERY 6 HOURS AS NEEDED FOR CONSTIPATION  0  . docusate sodium (COLACE) 100 MG capsule Take 100 mg by mouth daily as needed for mild  constipation.    . fluorouracil (EFUDEX) 5 % cream Apply topically 2 (two) times daily. (Patient not taking: Reported on 11/08/2016) 40 g 0  . gabapentin (NEURONTIN) 300 MG capsule Take 300 mg by mouth 3 (three) times daily.    Marland Kitchen HYDROcodone-acetaminophen (NORCO) 10-325 MG tablet Take 1 tablet by mouth every 6 (six) hours as needed. 30 tablet 0  . Multiple Vitamin (MULTIVITAMIN) tablet Take 1 tablet by mouth daily.    . mupirocin cream (BACTROBAN) 2 % Apply 1 application topically 2 (two) times daily. 15 g 0  . predniSONE (DELTASONE) 10 MG tablet Take 2 tablets (20 mg total) by mouth daily with breakfast. Take 20 mg a day for 2 weeks and then take 10 mg a day for 2 weeks. 60 tablet 0  . pregabalin (LYRICA) 150 MG capsule Take 1 capsule (150 mg total) by mouth 2 (two) times daily. 90 capsule 2  . PROAIR HFA 108 (90 Base) MCG/ACT inhaler     . promethazine (PHENERGAN) 25 MG tablet     . promethazine (PHENERGAN) 25 MG tablet Take 25 mg by mouth every 6 (six) hours as needed for nausea or vomiting.    Marland Kitchen SPIRIVA HANDIHALER 18 MCG inhalation capsule INHALE 1 CAPSULE VIA HANDIHALER ONCE DAILY AT THE SAME TIME EVERY DAY  5  . SYMBICORT 160-4.5 MCG/ACT inhaler     . Wound Dressings (CURASOL WOUND DRESSING) GEL Apply 1 application topically daily. (Patient not taking: Reported on 11/08/2016)  30 g 0  . Wound Dressings (MEDIHONEY WOUND/BURN DRESSING) GEL Apply to affected area daily. 1 Tube 3   No current facility-administered medications on file prior to visit.     No Known Allergies  Objective: There were no vitals filed for this visit.  General: No acute distress, AAOx3  Left foot: Abrasion to left 2nd and 3rd toe dorsal PIPJ with no signs of infection. Ulceration at hallux surgical site measures 0.3 x 0.1cm at distal opening and 0.5x0.3cm at proximal opening with granular tissue present with surrounding keratosis with possible recurrence of wart , no maceration, decreased swelling, no erythema, no  warmth, bloody drainage, no acute signs of infection noted, Capillary fill time <3 seconds in all digits,  No pain or crepitation with range of motion left foot. Neuropathy unchanged from prior. No pain with calf compression.    Assessment and Plan:  Problem List Items Addressed This Visit    None    Visit Diagnoses    Toe ulcer, left, limited to breakdown of skin ([facility])    -  Primary   Plantar wart       Status post left foot surgery         -Patient seen and evaluated -Debrided ulceration using tissue nipper applied Prisma dressing to wound bed and catherone surrounding dry sterile dressing to surgical site left hallux secured with coban wrap and stockinet  -Advised patient to do the same with finishing Prisma at home every day and efudex periwound until medihoney is received  -Continue with applying antibiotic cream to left 2nd toe and 3rd toe -Advised to monitor for signs of infection, If occurs to return to office or go to ER immediately  -Advised patient to continue with post-op shoe on left foot -Advised patient to limit activity to necessity  -Advised patient to elevate as necessary  -Refilled Norco to take as needed for pain one last time until he sees pain management; Pain management consult placed; awaiting recs as previous -Continue with Lyrica for neuropathy -Patient is also to follow up with spine doctor about his back -Return in 2-3  weeks for follow up care. In the meantime, patient to call office if any issues or problems arise.   [name], DPM

## 2017-01-11 ENCOUNTER — Ambulatory Visit (INDEPENDENT_AMBULATORY_CARE_PROVIDER_SITE_OTHER): Payer: Medicare Other | Admitting: Sports Medicine

## 2017-01-11 ENCOUNTER — Encounter: Payer: Self-pay | Admitting: Sports Medicine

## 2017-01-11 DIAGNOSIS — L97521 Non-pressure chronic ulcer of other part of left foot limited to breakdown of skin: Secondary | ICD-10-CM

## 2017-01-11 DIAGNOSIS — M79672 Pain in left foot: Secondary | ICD-10-CM | POA: Diagnosis not present

## 2017-01-11 DIAGNOSIS — Z9889 Other specified postprocedural states: Secondary | ICD-10-CM

## 2017-01-11 DIAGNOSIS — G629 Polyneuropathy, unspecified: Secondary | ICD-10-CM | POA: Diagnosis not present

## 2017-01-11 DIAGNOSIS — B070 Plantar wart: Secondary | ICD-10-CM

## 2017-01-11 NOTE — Progress Notes (Signed)
Patient ID: [name], male   DOB: 1956-03-22, 61 y.o.   MRN: [id number] Subjective: [name] is a 61 y.o. male patient seen today in office for POV #15 (DOS 05-10-16), S/P Excision of wart with laser destruction, bone biopsy, and partial closure, Patient states changing dressing every other day using Prisma and efudex cream surrounding, admits to decreased bloody drainage and decreased pain at surgical site; denies calf pain, denies headache, chest pain, shortness of breath, nausea, vomiting, fever, or chills. No other issues noted.   Patient Active Problem List   Diagnosis Date Noted  . Chronic left-sided low back pain without sciatica 11/08/2016  . Toe ulcer ([place]) 03/08/2016  . Verruca 12/28/2015  . Pre-ulcerative calluses 12/28/2015  . Hx of adenomatous colonic polyps 06/09/2015  . Lipoma 03/13/2014  . Plantar warts 07/09/2012  . Cellulitis 07/04/2012  . Neuropathy ([place]) 07/04/2012  . Hyperglycemia 07/04/2012  . HYPERTENSION, BENIGN 10/20/2010  . EDEMA 08/20/2010  . WISDOM TEETH EXTRACTION, HX OF 08/20/2010  . HYPOALBUMINEMIA 08/19/2010  . ANEMIA 08/19/2010  . NICOTINE ADDICTION 08/19/2010  . NECROTIZING PNEUMONIA 08/19/2010  . OSTEOARTHRITIS 08/19/2010    Current Outpatient Prescriptions on File Prior to Visit  Medication Sig Dispense Refill  . atorvastatin (LIPITOR) 10 MG tablet Take 10 mg by mouth at bedtime.  5  . buPROPion (WELLBUTRIN SR) 150 MG 12 hr tablet Take 150 mg by mouth 2 (two) times daily.    . CHANTIX CONTINUING MONTH PAK 1 MG tablet TAKE AS PER PACKAGE DIRECTIONS  1  . CHANTIX STARTING MONTH PAK 0.5 MG X 11 & 1 MG X 42 tablet TAKE AS PRESCRIBED  0  . docusate sodium (COLACE) 100 MG capsule TAKE ONE CAPSULE BY MOUTH EVERY 6 HOURS AS NEEDED FOR CONSTIPATION  0  . docusate sodium (COLACE) 100 MG capsule Take 100 mg by mouth daily as needed for mild constipation.    . fluorouracil (EFUDEX) 5 % cream Apply topically 2 (two) times daily. (Patient not  taking: Reported on 11/08/2016) 40 g 0  . gabapentin (NEURONTIN) 300 MG capsule Take 300 mg by mouth 3 (three) times daily.    Marland Kitchen HYDROcodone-acetaminophen (NORCO) 10-325 MG tablet Take 1 tablet by mouth every 6 (six) hours as needed. 30 tablet 0  . Multiple Vitamin (MULTIVITAMIN) tablet Take 1 tablet by mouth daily.    . mupirocin cream (BACTROBAN) 2 % Apply 1 application topically 2 (two) times daily. 15 g 0  . predniSONE (DELTASONE) 10 MG tablet Take 2 tablets (20 mg total) by mouth daily with breakfast. Take 20 mg a day for 2 weeks and then take 10 mg a day for 2 weeks. 60 tablet 0  . pregabalin (LYRICA) 150 MG capsule Take 1 capsule (150 mg total) by mouth 2 (two) times daily. 90 capsule 2  . PROAIR HFA 108 (90 Base) MCG/ACT inhaler     . promethazine (PHENERGAN) 25 MG tablet     . promethazine (PHENERGAN) 25 MG tablet Take 25 mg by mouth every 6 (six) hours as needed for nausea or vomiting.    Marland Kitchen SPIRIVA HANDIHALER 18 MCG inhalation capsule INHALE 1 CAPSULE VIA HANDIHALER ONCE DAILY AT THE SAME TIME EVERY DAY  5  . SYMBICORT 160-4.5 MCG/ACT inhaler     . Wound Dressings (CURASOL WOUND DRESSING) GEL Apply 1 application topically daily. (Patient not taking: Reported on 11/08/2016) 30 g 0  . Wound Dressings (MEDIHONEY WOUND/BURN DRESSING) GEL Apply to affected area daily. 1 Tube 3  No current facility-administered medications on file prior to visit.     No Known Allergies  Objective: There were no vitals filed for this visit.  General: No acute distress, AAOx3  Left foot: Abrasion to left 2nd and 3rd toe dorsal PIPJ with no signs of infection. Ulceration at hallux surgical site measures 0.2 x 0.1cm at distal opening and 0.2x0.3cm at proximal opening with granular tissue present with surrounding keratosis with possible mild recurrence of wart , no maceration, decreased swelling, no erythema, no warmth, bloody drainage, no acute signs of infection noted, Capillary fill time <3 seconds in all  digits,  No pain or crepitation with range of motion left foot. Neuropathy unchanged from prior. No pain with calf compression.    Assessment and Plan:  Problem List Items Addressed This Visit      Nervous and Auditory   Neuropathy ([place])     Other   Verruca    Other Visit Diagnoses    Toe ulcer, left, limited to breakdown of skin ([place])    -  Primary   Status post left foot surgery       Left foot pain         -Patient seen and evaluated -Debrided ulceration using tissue nipper applied Prisma dressing to wound bed and catherone surrounding dry sterile dressing to surgical site left hallux secured with coban wrap and stockinet  -Advised patient to do the same with finishing Prisma at home every day and efudex periwound until medihoney is received  -Continue with applying antibiotic cream to left 2nd toe and 3rd toe abrasions -Advised to monitor for signs of infection, If occurs to return to office or go to ER immediately  -Advised patient to continue with post-op shoe on left foot -Advised patient to limit activity to necessity  -Advised patient to elevate as necessary  -Awaiting Pain management consult placed; awaiting recs as previous; reports that he will call for appointment after MRI of back  -Continue with Lyrica for neuropathy -Return in 4  weeks for follow up care. In the meantime, patient to call office if any issues or problems arise.   [name], DPM

## 2017-01-12 ENCOUNTER — Ambulatory Visit (HOSPITAL_COMMUNITY)
Admission: RE | Admit: 2017-01-12 | Discharge: 2017-01-12 | Disposition: A | Discharge status: Home / Self Care | Payer: Medicare Other | Source: Ambulatory Visit | Attending: Family | Admitting: Family

## 2017-01-12 ENCOUNTER — Other Ambulatory Visit (INDEPENDENT_AMBULATORY_CARE_PROVIDER_SITE_OTHER): Payer: Self-pay | Admitting: Family

## 2017-01-12 DIAGNOSIS — M47816 Spondylosis without myelopathy or radiculopathy, lumbar region: Secondary | ICD-10-CM | POA: Insufficient documentation

## 2017-01-12 DIAGNOSIS — G8929 Other chronic pain: Secondary | ICD-10-CM

## 2017-01-12 DIAGNOSIS — M48061 Spinal stenosis, lumbar region without neurogenic claudication: Secondary | ICD-10-CM | POA: Diagnosis not present

## 2017-01-12 DIAGNOSIS — M5416 Radiculopathy, lumbar region: Secondary | ICD-10-CM

## 2017-01-12 DIAGNOSIS — M5136 Other intervertebral disc degeneration, lumbar region: Secondary | ICD-10-CM | POA: Diagnosis not present

## 2017-01-12 DIAGNOSIS — M545 Low back pain, unspecified: Secondary | ICD-10-CM

## 2017-01-12 NOTE — Progress Notes (Signed)
If you didn't already call him you can disregard

## 2017-01-12 NOTE — Progress Notes (Signed)
Will you call and make appt to f/u with duda for mri review, whenever is convenient

## 2017-01-13 NOTE — Progress Notes (Signed)
I received message to disregard calling patient to schedule appointment

## 2017-01-13 NOTE — Progress Notes (Signed)
Ok I did not call him and will disregard message.

## 2017-01-24 ENCOUNTER — Encounter (INDEPENDENT_AMBULATORY_CARE_PROVIDER_SITE_OTHER): Payer: Medicare Other | Admitting: Physical Medicine and Rehabilitation

## 2017-01-25 ENCOUNTER — Encounter (INDEPENDENT_AMBULATORY_CARE_PROVIDER_SITE_OTHER): Payer: Medicare Other | Admitting: Physical Medicine and Rehabilitation

## 2017-02-02 ENCOUNTER — Encounter (INDEPENDENT_AMBULATORY_CARE_PROVIDER_SITE_OTHER): Payer: Self-pay | Admitting: Physical Medicine and Rehabilitation

## 2017-02-02 ENCOUNTER — Ambulatory Visit (INDEPENDENT_AMBULATORY_CARE_PROVIDER_SITE_OTHER): Payer: Medicare Other

## 2017-02-02 ENCOUNTER — Ambulatory Visit (INDEPENDENT_AMBULATORY_CARE_PROVIDER_SITE_OTHER): Payer: Medicare Other | Admitting: Physical Medicine and Rehabilitation

## 2017-02-02 VITALS — BP 155/92 | HR 94

## 2017-02-02 DIAGNOSIS — M48061 Spinal stenosis, lumbar region without neurogenic claudication: Secondary | ICD-10-CM

## 2017-02-02 DIAGNOSIS — M5416 Radiculopathy, lumbar region: Secondary | ICD-10-CM

## 2017-02-02 MED ORDER — LIDOCAINE HCL (PF) 1 % IJ SOLN
0.3300 mL | Freq: Once | INTRAMUSCULAR | Status: AC
  Administered 2017-02-02: 0.3 mL

## 2017-02-02 MED ORDER — BUPIVACAINE HCL 0.25 % IJ SOLN
2.0000 mL | Freq: Once | INTRAMUSCULAR | Status: AC
  Administered 2017-02-02: 2 mL via INTRA_ARTICULAR

## 2017-02-02 NOTE — Patient Instructions (Signed)

## 2017-02-02 NOTE — Progress Notes (Signed)
[name] - 61 y.o. male MRN [id number]  Date of birth: [birth year]  Office Visit Note: Visit Date: 02/02/2017 PCP: [name], MD Referred by: [name], MD  Subjective: Chief Complaint  Patient presents with  . Lower Back - Pain   HPI: [name] is a 61 year old gentleman with history of chronic back pain now with a year of worsening symptoms. Low back pain for over a year.  Pain mainly center and radiates to both sides at times. Worse on left side. Radiates down legs after standing and walking long periods. Denies numbness and tingling. He can only stand for about 5 minutes with increasing pain and to sit down. He does get this radiating pattern but his main complaints are back pain. MRI is reviewed below that does show lateral recess narrowing particularly at L3-44-5 with some foraminal narrowing. He also has a facet arthropathy which could be an issue as well.     ROS Otherwise per HPI.  Assessment & Plan: Visit Diagnoses:  1. Lumbar radiculopathy   2. Spinal stenosis of lumbar region without neurogenic claudication     Plan: Findings:  Plan today is for left L4-5 interlaminar epidural steroid injection.    Meds & Orders:  Meds ordered this encounter  Medications  . lidocaine (PF) (XYLOCAINE) 1 % injection 0.3 mL  . Celestone 6 mg with Marcaine 0.25% 3 mL injection    Orders Placed This Encounter  Procedures  . XR C-ARM NO REPORT  . Epidural Steroid injection    Follow-up: Return in about 3 weeks (around 02/23/2017) for possible facet injection versus repeat.   Procedures: No procedures performed  Lumbar Epidural Steroid Injection - Interlaminar Approach with Fluoroscopic Guidance  Patient: [name]      Date of Birth: Aug 25, 1966 MRN: [id number] PCP: [name], MD      Visit Date: 02/02/2017   Universal Protocol:    Date/Time: 03/07/189:05 AM  Consent Given By: the patient  Position: PRONE  Additional Comments: Vital signs were  monitored before and after the procedure. Patient was prepped and draped in the usual sterile fashion. The correct patient, procedure, and site was verified.   Injection Procedure Details:  Procedure Site One Meds Administered:  Meds ordered this encounter  Medications  . lidocaine (PF) (XYLOCAINE) 1 % injection 0.3 mL  . Celestone 6 mg with Marcaine 0.25% 3 mL injection     Laterality: Left  Location/Site:  L4-L5  Needle size: 20 G  Needle type: Tuohy  Needle Placement: Paramedian epidural  Findings:  -Contrast Used: 1 mL iohexol 180 mg iodine/mL   -Comments: Excellent flow of contrast into the epidural space.  Procedure Details: Using a paramedian approach from the side mentioned above, the region overlying the inferior lamina was localized under fluoroscopic visualization and the soft tissues overlying this structure were infiltrated with 4 ml. of 1% Lidocaine without Epinephrine. The Tuohy needle was inserted into the epidural space using a paramedian approach.   The epidural space was localized using loss of resistance along with lateral and bi-planar fluoroscopic views.  After negative aspirate for air, blood, and CSF, a 2 ml. volume of Isovue-250 was injected into the epidural space and the flow of contrast was observed. Radiographs were obtained for documentation purposes.    The injectate was administered into the level noted above.   Additional Comments:  The patient tolerated the procedure well Dressing: Band-Aid    Post-procedure details: Patient was observed during the procedure. Post-procedure instructions were reviewed.  Patient left the clinic in stable condition.     Clinical History: Lumbar spine MRI 01/12/2017   L3-4: Moderate disc bulge with right greater than left facet and ligamentum flavum hypertrophy. Moderate right and mild left foraminal narrowing. Lateral recess effacement. Mild canal stenosis.  L4-5: Moderate disc bulge with right  greater than left facet and ligamentum flavum hypertrophy. Moderate right and mild left foraminal narrowing. Lateral recess effacement. Mild canal stenosis. Trace facet effusions.  L5-S1: Small disc bulge with posterior marginal osteophytes and bilateral facet hypertrophy. Mild-to-moderate bilateral foraminal narrowing. No significant canal stenosis.  IMPRESSION: 1. No acute osseous abnormality. 2. Lumbar spondylosis with both discogenic and facet degenerative changes most pronounced at the L3-4 and L4-5 levels. 3. At L3-4 and L4-5 there is moderate right and mild left foraminal narrowing, lateral recess effacement, and mild canal stenosis.  He reports that he has been smoking Cigarettes.  He started smoking about 43 years ago. He has a 8.00 pack-year smoking history. He has never used smokeless tobacco. No results for input(s): HGBA1C, LABURIC in the last 8760 hours.  Objective:  VS:  HT:    WT:   BMI:     BP:(!) 155/92  HR:94bpm  TEMP: ( )  RESP:95 % Physical Exam  Musculoskeletal:  Patient ambulates with a cane. He has good distal strength.    Ortho Exam Imaging: Xr C-arm No Report  Result Date: 02/02/2017 Please see Notes or Procedures tab for imaging impression.   Past Medical/Family/Surgical/Social History: Medications & Allergies reviewed per EMR Patient Active Problem List   Diagnosis Date Noted  . Chronic left-sided low back pain without sciatica 11/08/2016  . Toe ulcer ([place]) 03/08/2016  . Verruca 12/28/2015  . Pre-ulcerative calluses 12/28/2015  . Hx of adenomatous colonic polyps 06/09/2015  . Lipoma 03/13/2014  . Plantar warts 07/09/2012  . Cellulitis 07/04/2012  . Neuropathy ([place]) 07/04/2012  . Hyperglycemia 07/04/2012  . HYPERTENSION, BENIGN 10/20/2010  . EDEMA 08/20/2010  . WISDOM TEETH EXTRACTION, HX OF 08/20/2010  . HYPOALBUMINEMIA 08/19/2010  . ANEMIA 08/19/2010  . NICOTINE ADDICTION 08/19/2010  . NECROTIZING PNEUMONIA 08/19/2010  .  OSTEOARTHRITIS 08/19/2010   Past Medical History:  Diagnosis Date  . Hx of adenomatous colonic polyps 06/09/2015  . Hyperlipidemia    no mediciations  . Hypertension    has previously been on medication but then taken off  . Neuromuscular disorder (HCC)    neuropathy lower legs and feet  . Neuropathy ([place])    in both feet  . Pneumonia 2011   Family History  Problem Relation Age of Onset  . Heart disease Mother   . Cancer Father   . Diabetes Father   . Pancreatic cancer Father   . Cancer Sister   . Colon cancer Neg Hx   . Rectal cancer Neg Hx   . Stomach cancer Neg Hx   . Colon polyps Neg Hx    Past Surgical History:  Procedure Laterality Date  . AMPUTATION Bilateral 06/05/2013   Procedure: RIGHT GREAT TOE AMPUTATION/LEFT GREAT TOE DEBRIDEMENT;  Surgeon: Wylene Simmer, MD;  Location: [facility];  Service: Orthopedics;  Laterality: Bilateral;  . MOUTH SURGERY    . TOE AMPUTATION Right 06/05/2013   RIGHT GREAT TOE PARTIAL AMPUTATION    . TOOTH EXTRACTION  2011   multiple teeth extracted   Social History   Occupational History  . Not on file.   Social History Main Topics  . Smoking status: Current Some Day Smoker    Packs/day:  0.20    Years: 40.00    Types: Cigarettes    Start date: 11/29/1973  . Smokeless tobacco: Never Used     Comment: occasional smoker - ~1 pack/week. smoked ~1.5ppd until 5 yr ago  . Alcohol use No  . Drug use: No  . Sexual activity: Not on file

## 2017-02-02 NOTE — Procedures (Signed)
Lumbar Epidural Steroid Injection - Interlaminar Approach with Fluoroscopic Guidance  Patient: [name]      Date of Birth: Jul 17, 1956 MRN: [id number] PCP: [name], MD      Visit Date: 02/02/2017   Universal Protocol:    Date/Time: 03/07/189:05 AM  Consent Given By: the patient  Position: PRONE  Additional Comments: Vital signs were monitored before and after the procedure. Patient was prepped and draped in the usual sterile fashion. The correct patient, procedure, and site was verified.   Injection Procedure Details:  Procedure Site One Meds Administered:  Meds ordered this encounter  Medications  . lidocaine (PF) (XYLOCAINE) 1 % injection 0.3 mL  . Celestone 6 mg with Marcaine 0.25% 3 mL injection     Laterality: Left  Location/Site:  L4-L5  Needle size: 20 G  Needle type: Tuohy  Needle Placement: Paramedian epidural  Findings:  -Contrast Used: 1 mL iohexol 180 mg iodine/mL   -Comments: Excellent flow of contrast into the epidural space.  Procedure Details: Using a paramedian approach from the side mentioned above, the region overlying the inferior lamina was localized under fluoroscopic visualization and the soft tissues overlying this structure were infiltrated with 4 ml. of 1% Lidocaine without Epinephrine. The Tuohy needle was inserted into the epidural space using a paramedian approach.   The epidural space was localized using loss of resistance along with lateral and bi-planar fluoroscopic views.  After negative aspirate for air, blood, and CSF, a 2 ml. volume of Isovue-250 was injected into the epidural space and the flow of contrast was observed. Radiographs were obtained for documentation purposes.    The injectate was administered into the level noted above.   Additional Comments:  The patient tolerated the procedure well Dressing: Band-Aid    Post-procedure details: Patient was observed during the procedure. Post-procedure  instructions were reviewed.  Patient left the clinic in stable condition.

## 2017-02-08 ENCOUNTER — Encounter: Payer: Self-pay | Admitting: Sports Medicine

## 2017-02-08 ENCOUNTER — Ambulatory Visit (INDEPENDENT_AMBULATORY_CARE_PROVIDER_SITE_OTHER): Payer: Medicare Other | Admitting: Sports Medicine

## 2017-02-08 ENCOUNTER — Telehealth: Payer: Self-pay | Admitting: *Deleted

## 2017-02-08 DIAGNOSIS — B070 Plantar wart: Secondary | ICD-10-CM

## 2017-02-08 DIAGNOSIS — G629 Polyneuropathy, unspecified: Secondary | ICD-10-CM

## 2017-02-08 DIAGNOSIS — L97521 Non-pressure chronic ulcer of other part of left foot limited to breakdown of skin: Secondary | ICD-10-CM | POA: Diagnosis not present

## 2017-02-08 DIAGNOSIS — M79672 Pain in left foot: Secondary | ICD-10-CM

## 2017-02-08 DIAGNOSIS — Z9889 Other specified postprocedural states: Secondary | ICD-10-CM

## 2017-02-08 MED ORDER — HYDROCODONE-ACETAMINOPHEN 10-325 MG PO TABS: 1.0000 | ORAL_TABLET | Freq: Four times a day (QID) | ORAL | 0 refills | Status: DC | PRN

## 2017-02-08 MED ORDER — CEPHALEXIN 500 MG PO CAPS: 500.0000 mg | ORAL_CAPSULE | Freq: Four times a day (QID) | ORAL | 0 refills | Status: DC

## 2017-02-08 NOTE — Telephone Encounter (Addendum)
-----   Message from [name], [place] sent at 02/08/2017 12:04 PM EDT ----- Regarding: medihoney Patient states that he never got the [place]. I spoke with [facility] she states pt did not pick up it was sold yesterday, pt can request and they will have it next day. Left message informing pt the medihoney could be ordered by him at [facility] [phone] and they would order for him it would be around $40.00. 03/09/2017-Faxed required referral form, clinicals and demographics to [facility].

## 2017-02-08 NOTE — Progress Notes (Signed)
Patient ID: [name], male   DOB: 11/01/1956, 61 y.o.   MRN: [id number] Subjective: [name] is a 61 y.o. male patient seen today in office for POV #16 (DOS 05-10-16), S/P Excision of wart with laser destruction, bone biopsy, and partial closure, Patient states changing dressing every other day using Prisma and efudex cream surrounding, admits to decreased bloody drainage and decreased pain at surgical site; denies calf pain, denies headache, chest pain, shortness of breath, nausea, vomiting, fever, or chills. No other issues noted.   Reports had injection in back which have not helped.   Patient Active Problem List   Diagnosis Date Noted  . Chronic left-sided low back pain without sciatica 11/08/2016  . Toe ulcer ([place]) 03/08/2016  . Verruca 12/28/2015  . Pre-ulcerative calluses 12/28/2015  . Hx of adenomatous colonic polyps 06/09/2015  . Lipoma 03/13/2014  . Plantar warts 07/09/2012  . Cellulitis 07/04/2012  . Neuropathy ([place]) 07/04/2012  . Hyperglycemia 07/04/2012  . HYPERTENSION, BENIGN 10/20/2010  . EDEMA 08/20/2010  . WISDOM TEETH EXTRACTION, HX OF 08/20/2010  . HYPOALBUMINEMIA 08/19/2010  . ANEMIA 08/19/2010  . NICOTINE ADDICTION 08/19/2010  . NECROTIZING PNEUMONIA 08/19/2010  . OSTEOARTHRITIS 08/19/2010    Current Outpatient Prescriptions on File Prior to Visit  Medication Sig Dispense Refill  . atorvastatin (LIPITOR) 10 MG tablet Take 10 mg by mouth at bedtime.  5  . buPROPion (WELLBUTRIN SR) 150 MG 12 hr tablet Take 150 mg by mouth 2 (two) times daily.    . CHANTIX CONTINUING MONTH PAK 1 MG tablet TAKE AS PER PACKAGE DIRECTIONS  1  . CHANTIX STARTING MONTH PAK 0.5 MG X 11 & 1 MG X 42 tablet TAKE AS PRESCRIBED  0  . docusate sodium (COLACE) 100 MG capsule TAKE ONE CAPSULE BY MOUTH EVERY 6 HOURS AS NEEDED FOR CONSTIPATION  0  . docusate sodium (COLACE) 100 MG capsule Take 100 mg by mouth daily as needed for mild constipation.    . fluorouracil (EFUDEX) 5 %  cream Apply topically 2 (two) times daily. (Patient not taking: Reported on 11/08/2016) 40 g 0  . gabapentin (NEURONTIN) 300 MG capsule Take 300 mg by mouth 3 (three) times daily.    . Multiple Vitamin (MULTIVITAMIN) tablet Take 1 tablet by mouth daily.    . mupirocin cream (BACTROBAN) 2 % Apply 1 application topically 2 (two) times daily. (Patient not taking: Reported on 02/02/2017) 15 g 0  . predniSONE (DELTASONE) 10 MG tablet Take 2 tablets (20 mg total) by mouth daily with breakfast. Take 20 mg a day for 2 weeks and then take 10 mg a day for 2 weeks. (Patient not taking: Reported on 02/02/2017) 60 tablet 0  . pregabalin (LYRICA) 150 MG capsule Take 1 capsule (150 mg total) by mouth 2 (two) times daily. 90 capsule 2  . PROAIR HFA 108 (90 Base) MCG/ACT inhaler     . promethazine (PHENERGAN) 25 MG tablet     . promethazine (PHENERGAN) 25 MG tablet Take 25 mg by mouth every 6 (six) hours as needed for nausea or vomiting.    Marland Kitchen SPIRIVA HANDIHALER 18 MCG inhalation capsule INHALE 1 CAPSULE VIA HANDIHALER ONCE DAILY AT THE SAME TIME EVERY DAY  5  . SYMBICORT 160-4.5 MCG/ACT inhaler     . Wound Dressings (CURASOL WOUND DRESSING) GEL Apply 1 application topically daily. (Patient not taking: Reported on 11/08/2016) 30 g 0  . Wound Dressings (MEDIHONEY WOUND/BURN DRESSING) GEL Apply to affected area daily. (  Patient not taking: Reported on 02/02/2017) 1 Tube 3   No current facility-administered medications on file prior to visit.     No Known Allergies  Objective: There were no vitals filed for this visit.  General: No acute distress, AAOx3  Left foot: Abrasion to left 2nd and 3rd toe dorsal PIPJ with no signs of infection almost completely healed. Ulceration at hallux surgical site measures 0.2 x 0.1cm at distal opening and 0.2x0.5cm at proximal opening with granular tissue present with surrounding keratosis with possible mild recurrence of wart , no maceration, decreased swelling, no erythema, no warmth,  bloody drainage, no acute signs of infection noted, Capillary fill time <3 seconds in all digits,  No pain or crepitation with range of motion left foot. Neuropathy unchanged from prior. No pain with calf compression.    Assessment and Plan:  Problem List Items Addressed This Visit      Nervous and Auditory   Neuropathy ([place])    Other Visit Diagnoses    Foot pain, left    -  Primary   Relevant Medications   HYDROcodone-acetaminophen (NORCO) 10-325 MG tablet   Toe ulcer, left, limited to breakdown of skin (HCC)       Plantar wart       Relevant Medications   cephALEXin (KEFLEX) 500 MG capsule   Status post left foot surgery         -Patient seen and evaluated -Debrided ulceration using tissue nipper applied Prisma dressing to wound bed and catherone surrounding dry sterile dressing to surgical site left hallux secured with coban wrap and stockinet  -Advised patient to do the same with finishing Prisma at home every day and efudex periwound until medihoney is received; repeat request made -Continue with applying antibiotic cream to left 2nd toe and 3rd toe abrasions -Refilled keflex for preventative measures if needed  -Advised to monitor for signs of infection, If occurs to return to office or go to ER immediately  -Advised patient to continue with post-op shoe on left foot -Advised patient to limit activity to necessity  -Advised patient to elevate as necessary  -Awaiting Pain management consult placed; awaiting recs as previous; refilled Norco for last time -Continue with Lyrica for neuropathy -Return in 4  weeks for follow up care. In the meantime, patient to call office if any issues or problems arise.   [name], DPM

## 2017-02-23 ENCOUNTER — Ambulatory Visit (INDEPENDENT_AMBULATORY_CARE_PROVIDER_SITE_OTHER): Payer: Medicare Other

## 2017-02-23 ENCOUNTER — Encounter (INDEPENDENT_AMBULATORY_CARE_PROVIDER_SITE_OTHER): Payer: Self-pay | Admitting: Physical Medicine and Rehabilitation

## 2017-02-23 ENCOUNTER — Ambulatory Visit (INDEPENDENT_AMBULATORY_CARE_PROVIDER_SITE_OTHER): Payer: Medicare Other | Admitting: Physical Medicine and Rehabilitation

## 2017-02-23 VITALS — BP 159/92 | HR 89 | Temp 98.7°F

## 2017-02-23 DIAGNOSIS — M47816 Spondylosis without myelopathy or radiculopathy, lumbar region: Secondary | ICD-10-CM | POA: Diagnosis not present

## 2017-02-23 MED ORDER — METHYLPREDNISOLONE ACETATE 80 MG/ML IJ SUSP: 80.0000 mg | Freq: Once | INTRAMUSCULAR | Status: DC

## 2017-02-23 MED ORDER — LIDOCAINE HCL (PF) 1 % IJ SOLN: 0.3300 mL | Freq: Once | INTRAMUSCULAR | Status: DC

## 2017-02-23 NOTE — Progress Notes (Signed)
[name] - 61 y.o. male MRN [id number]  Date of birth: [birth year]  Office Visit Note: Visit Date: 02/23/2017 PCP: [name], MD Referred by: [name], MD  Subjective: Chief Complaint  Patient presents with  . Lower Back - Pain   HPI: [name] is a pleasant 61 year old gentleman who comes in today after epidural injection. He states that it took a week to feel any difference with last injection and then he had less than 50% relief. Pain across lower back into buttocks. Right=left. Worse with walking and standing. I am going to complete diagnostic facet joint blocks at L4-5 bilaterally. We did talk about this in the last note and evaluation with him. His MRI is reviewed again below with him.    ROS Otherwise per HPI.  Assessment & Plan: Visit Diagnoses:  1. Spondylosis without myelopathy or radiculopathy, lumbar region     Plan: Findings:  Bilateral L4-5 facet joint blocks.    Meds & Orders:  Meds ordered this encounter  Medications  . lidocaine (PF) (XYLOCAINE) 1 % injection 0.3 mL  . methylPREDNISolone acetate (DEPO-MEDROL) injection 80 mg    Orders Placed This Encounter  Procedures  . Facet Injection  . XR C-ARM NO REPORT    Follow-up: Return if symptoms worsen or fail to improve.   Procedures: No procedures performed  Lumbar Facet Joint Intra-Articular Injection(s) with Fluoroscopic Guidance  Patient: [name]      Date of Birth: 1956-05-27 MRN: [id number] PCP: [name], MD      Visit Date: 02/23/2017   Universal Protocol:    Date/Time: 03/30/186:03 AM  Consent Given By: the patient  Position: PRONE   Additional Comments: Vital signs were monitored before and after the procedure. Patient was prepped and draped in the usual sterile fashion. The correct patient, procedure, and site was verified.   Injection Procedure Details:  Procedure Site One Meds Administered:  Meds ordered this encounter  Medications  . lidocaine  (PF) (XYLOCAINE) 1 % injection 0.3 mL  . methylPREDNISolone acetate (DEPO-MEDROL) injection 80 mg     Laterality: Bilateral  Location/Site:  L4-L5  Needle size: 22 guage  Needle type: Spinal  Needle Placement: Articular  Findings:  -Contrast Used: 1 mL iohexol 180 mg iodine/mL   -Comments: Excellent flow of contrast producing a partial arthrogram.  Procedure Details: The fluoroscope beam is vertically oriented in AP, and the inferior recess is visualized beneath the lower pole of the inferior apophyseal process, which represents the target point for needle insertion. When direct visualization is difficult the target point is located at the medial projection of the vertebral pedicle. The region overlying each aforementioned target is locally anesthetized with a 1 to 2 ml. volume of 1% Lidocaine without Epinephrine.   The spinal needle was inserted into each of the above mentioned facet joints using biplanar fluoroscopic guidance. A 0.25 to 0.5 ml. volume of Isovue-250 was injected and a partial facet joint arthrogram was obtained. A single spot film was obtained of the resulting arthrogram.    One to 1.25 ml of the steroid/anesthetic solution was then injected into each of the facet joints noted above.   Additional Comments:  The patient tolerated the procedure well Dressing: Band-Aid    Post-procedure details: Patient was observed during the procedure. Post-procedure instructions were reviewed.  Patient left the clinic in stable condition.     Clinical History: Lumbar spine MRI 01/12/2017   L3-4: Moderate disc bulge with right greater than left facet and ligamentum  flavum hypertrophy. Moderate right and mild left foraminal narrowing. Lateral recess effacement. Mild canal stenosis.  L4-5: Moderate disc bulge with right greater than left facet and ligamentum flavum hypertrophy. Moderate right and mild left foraminal narrowing. Lateral recess effacement. Mild canal  stenosis. Trace facet effusions.  L5-S1: Small disc bulge with posterior marginal osteophytes and bilateral facet hypertrophy. Mild-to-moderate bilateral foraminal narrowing. No significant canal stenosis.  IMPRESSION: 1. No acute osseous abnormality. 2. Lumbar spondylosis with both discogenic and facet degenerative changes most pronounced at the L3-4 and L4-5 levels. 3. At L3-4 and L4-5 there is moderate right and mild left foraminal narrowing, lateral recess effacement, and mild canal stenosis.  He reports that he has been smoking Cigarettes.  He started smoking about 43 years ago. He has a 8.00 pack-year smoking history. He has never used smokeless tobacco. No results for input(s): HGBA1C, LABURIC in the last 8760 hours.  Objective:  VS:  HT:    WT:   BMI:     BP:(!) 159/92  HR:89bpm  TEMP:98.7 F (37.1 C)( )  RESP:93 % Physical Exam  Musculoskeletal:  Concordant low back pain with extension rotation bilaterally. Good distal strength.    Ortho Exam Imaging: No results found.  Past Medical/Family/Surgical/Social History: Medications & Allergies reviewed per EMR Patient Active Problem List   Diagnosis Date Noted  . Chronic left-sided low back pain without sciatica 11/08/2016  . Toe ulcer ([place]) 03/08/2016  . Verruca 12/28/2015  . Pre-ulcerative calluses 12/28/2015  . Hx of adenomatous colonic polyps 06/09/2015  . Lipoma 03/13/2014  . Plantar warts 07/09/2012  . Cellulitis 07/04/2012  . Neuropathy ([place]) 07/04/2012  . Hyperglycemia 07/04/2012  . HYPERTENSION, BENIGN 10/20/2010  . EDEMA 08/20/2010  . WISDOM TEETH EXTRACTION, HX OF 08/20/2010  . HYPOALBUMINEMIA 08/19/2010  . ANEMIA 08/19/2010  . NICOTINE ADDICTION 08/19/2010  . NECROTIZING PNEUMONIA 08/19/2010  . OSTEOARTHRITIS 08/19/2010   Past Medical History:  Diagnosis Date  . Hx of adenomatous colonic polyps 06/09/2015  . Hyperlipidemia    no mediciations  . Hypertension    has previously been on  medication but then taken off  . Neuromuscular disorder (HCC)    neuropathy lower legs and feet  . Neuropathy ([place])    in both feet  . Pneumonia 2011   Family History  Problem Relation Age of Onset  . Heart disease Mother   . Cancer Father   . Diabetes Father   . Pancreatic cancer Father   . Cancer Sister   . Colon cancer Neg Hx   . Rectal cancer Neg Hx   . Stomach cancer Neg Hx   . Colon polyps Neg Hx    Past Surgical History:  Procedure Laterality Date  . AMPUTATION Bilateral 06/05/2013   Procedure: RIGHT GREAT TOE AMPUTATION/LEFT GREAT TOE DEBRIDEMENT;  Surgeon: Wylene Simmer, MD;  Location: [facility];  Service: Orthopedics;  Laterality: Bilateral;  . MOUTH SURGERY    . TOE AMPUTATION Right 06/05/2013   RIGHT GREAT TOE PARTIAL AMPUTATION    . TOOTH EXTRACTION  2011   multiple teeth extracted   Social History   Occupational History  . Not on file.   Social History Main Topics  . Smoking status: Current Some Day Smoker    Packs/day: 0.20    Years: 40.00    Types: Cigarettes    Start date: 11/29/1973  . Smokeless tobacco: Never Used     Comment: occasional smoker - ~1 pack/week. smoked ~1.5ppd until 5 yr ago  . Alcohol use  No  . Drug use: No  . Sexual activity: Not on file

## 2017-02-23 NOTE — Patient Instructions (Signed)

## 2017-02-25 NOTE — Procedures (Signed)
Lumbar Facet Joint Intra-Articular Injection(s) with Fluoroscopic Guidance  Patient: [name]      Date of Birth: August 03, 1956 MRN: [id number] PCP: [name], MD      Visit Date: 02/23/2017   Universal Protocol:    Date/Time: 03/30/186:03 AM  Consent Given By: the patient  Position: PRONE   Additional Comments: Vital signs were monitored before and after the procedure. Patient was prepped and draped in the usual sterile fashion. The correct patient, procedure, and site was verified.   Injection Procedure Details:  Procedure Site One Meds Administered:  Meds ordered this encounter  Medications  . lidocaine (PF) (XYLOCAINE) 1 % injection 0.3 mL  . methylPREDNISolone acetate (DEPO-MEDROL) injection 80 mg     Laterality: Bilateral  Location/Site:  L4-L5  Needle size: 22 guage  Needle type: Spinal  Needle Placement: Articular  Findings:  -Contrast Used: 1 mL iohexol 180 mg iodine/mL   -Comments: Excellent flow of contrast producing a partial arthrogram.  Procedure Details: The fluoroscope beam is vertically oriented in AP, and the inferior recess is visualized beneath the lower pole of the inferior apophyseal process, which represents the target point for needle insertion. When direct visualization is difficult the target point is located at the medial projection of the vertebral pedicle. The region overlying each aforementioned target is locally anesthetized with a 1 to 2 ml. volume of 1% Lidocaine without Epinephrine.   The spinal needle was inserted into each of the above mentioned facet joints using biplanar fluoroscopic guidance. A 0.25 to 0.5 ml. volume of Isovue-250 was injected and a partial facet joint arthrogram was obtained. A single spot film was obtained of the resulting arthrogram.    One to 1.25 ml of the steroid/anesthetic solution was then injected into each of the facet joints noted above.   Additional Comments:  The patient tolerated the  procedure well Dressing: Band-Aid    Post-procedure details: Patient was observed during the procedure. Post-procedure instructions were reviewed.  Patient left the clinic in stable condition.

## 2017-03-08 ENCOUNTER — Ambulatory Visit (INDEPENDENT_AMBULATORY_CARE_PROVIDER_SITE_OTHER): Payer: Medicare Other | Admitting: Sports Medicine

## 2017-03-08 DIAGNOSIS — G629 Polyneuropathy, unspecified: Secondary | ICD-10-CM

## 2017-03-08 DIAGNOSIS — L97521 Non-pressure chronic ulcer of other part of left foot limited to breakdown of skin: Secondary | ICD-10-CM

## 2017-03-08 DIAGNOSIS — Z9889 Other specified postprocedural states: Secondary | ICD-10-CM

## 2017-03-08 DIAGNOSIS — M79672 Pain in left foot: Secondary | ICD-10-CM

## 2017-03-08 DIAGNOSIS — B070 Plantar wart: Secondary | ICD-10-CM

## 2017-03-08 MED ORDER — HYDROCODONE-ACETAMINOPHEN 10-325 MG PO TABS: 1.0000 | ORAL_TABLET | Freq: Four times a day (QID) | ORAL | 0 refills | Status: DC | PRN

## 2017-03-08 NOTE — Progress Notes (Signed)
Patient ID: Unnamed [name], male   DOB: 1956-05-05, 61 y.o.   MRN: [id number] Subjective: [name] is a 61 y.o. male patient seen today in office for POV #17 (DOS 05-10-16), S/P Excision of wart with laser destruction, bone biopsy, and partial closure, Patient states changing dressing every other day using Prisma and efudex cream surrounding, admits to decreased bloody drainage and decreased pain at surgical site; states that his neuropathy pain is bad; tried back doctor who gave injections which is no longer helping; denies calf pain, denies headache, chest pain, shortness of breath, nausea, vomiting, fever, or chills. No other issues noted.   Patient Active Problem List   Diagnosis Date Noted  . Chronic left-sided low back pain without sciatica 11/08/2016  . Toe ulcer ([place]) 03/08/2016  . Verruca 12/28/2015  . Pre-ulcerative calluses 12/28/2015  . Hx of adenomatous colonic polyps 06/09/2015  . Lipoma 03/13/2014  . Plantar warts 07/09/2012  . Cellulitis 07/04/2012  . Neuropathy ([place]) 07/04/2012  . Hyperglycemia 07/04/2012  . HYPERTENSION, BENIGN 10/20/2010  . EDEMA 08/20/2010  . WISDOM TEETH EXTRACTION, HX OF 08/20/2010  . HYPOALBUMINEMIA 08/19/2010  . ANEMIA 08/19/2010  . NICOTINE ADDICTION 08/19/2010  . NECROTIZING PNEUMONIA 08/19/2010  . OSTEOARTHRITIS 08/19/2010    Current Outpatient Prescriptions on File Prior to Visit  Medication Sig Dispense Refill  . atorvastatin (LIPITOR) 10 MG tablet Take 10 mg by mouth at bedtime.  5  . buPROPion (WELLBUTRIN SR) 150 MG 12 hr tablet Take 150 mg by mouth 2 (two) times daily.    . cephALEXin (KEFLEX) 500 MG capsule Take 1 capsule (500 mg total) by mouth 4 (four) times daily. 40 capsule 0  . CHANTIX CONTINUING MONTH PAK 1 MG tablet TAKE AS PER PACKAGE DIRECTIONS  1  . CHANTIX STARTING MONTH PAK 0.5 MG X 11 & 1 MG X 42 tablet TAKE AS PRESCRIBED  0  . docusate sodium (COLACE) 100 MG capsule TAKE ONE CAPSULE BY MOUTH EVERY 6 HOURS AS  NEEDED FOR CONSTIPATION  0  . docusate sodium (COLACE) 100 MG capsule Take 100 mg by mouth daily as needed for mild constipation.    . fluorouracil (EFUDEX) 5 % cream Apply topically 2 (two) times daily. (Patient not taking: Reported on 11/08/2016) 40 g 0  . gabapentin (NEURONTIN) 300 MG capsule Take 300 mg by mouth 3 (three) times daily.    . Multiple Vitamin (MULTIVITAMIN) tablet Take 1 tablet by mouth daily.    . mupirocin cream (BACTROBAN) 2 % Apply 1 application topically 2 (two) times daily. (Patient not taking: Reported on 02/02/2017) 15 g 0  . predniSONE (DELTASONE) 10 MG tablet Take 2 tablets (20 mg total) by mouth daily with breakfast. Take 20 mg a day for 2 weeks and then take 10 mg a day for 2 weeks. (Patient not taking: Reported on 02/02/2017) 60 tablet 0  . pregabalin (LYRICA) 150 MG capsule Take 1 capsule (150 mg total) by mouth 2 (two) times daily. 90 capsule 2  . PROAIR HFA 108 (90 Base) MCG/ACT inhaler     . promethazine (PHENERGAN) 25 MG tablet     . promethazine (PHENERGAN) 25 MG tablet Take 25 mg by mouth every 6 (six) hours as needed for nausea or vomiting.    Marland Kitchen SPIRIVA HANDIHALER 18 MCG inhalation capsule INHALE 1 CAPSULE VIA HANDIHALER ONCE DAILY AT THE SAME TIME EVERY DAY  5  . SYMBICORT 160-4.5 MCG/ACT inhaler     . Wound Dressings (CURASOL WOUND  DRESSING) GEL Apply 1 application topically daily. (Patient not taking: Reported on 11/08/2016) 30 g 0  . Wound Dressings (MEDIHONEY WOUND/BURN DRESSING) GEL Apply to affected area daily. (Patient not taking: Reported on 02/02/2017) 1 Tube 3   Current Facility-Administered Medications on File Prior to Visit  Medication Dose Route Frequency Provider Last Rate Last Dose  . lidocaine (PF) (XYLOCAINE) 1 % injection 0.3 mL  0.3 mL Other Once Magnus Sinning, MD      . methylPREDNISolone acetate (DEPO-MEDROL) injection 80 mg  80 mg Other Once Magnus Sinning, MD        No Known Allergies  Objective: There were no vitals filed for this  visit.  General: No acute distress, AAOx3  Left foot: Abrasion to left 2nd and 3rd toe dorsal PIPJ with no signs of infection almost completely healed. Ulceration at hallux surgical site measures 0.2 x 0.1cm at distal opening and 0.2x0.2cm smaller at proximal opening with granular tissue present with surrounding keratosis with possible mild recurrence of wart , no maceration, decreased swelling, no erythema, no warmth, bloody drainage, no acute signs of infection noted, Capillary fill time <3 seconds in all digits,  No pain or crepitation with range of motion left foot. Neuropathy unchanged from prior. No pain with calf compression.    Assessment and Plan:  Problem List Items Addressed This Visit      Nervous and Auditory   Neuropathy ([place])    Other Visit Diagnoses    Toe ulcer, left, limited to breakdown of skin ([place])    -  Primary   Foot pain, left       Relevant Medications   HYDROcodone-acetaminophen (NORCO) 10-325 MG tablet   Plantar wart       Status post left foot surgery         -Patient seen and evaluated -Debrided ulceration using tissue nipper applied silver nitrate and dry sterile dressing to surgical site left hallux secured with coban wrap and stockinet  -Advised patient to continue with Prisma at home every day and efudex periwound area -Continue with applying antibiotic cream to left 2nd toe and 3rd toe abrasions -Continue Keflex until completed  -Advised to monitor for signs of infection, If occurs to return to office or go to ER immediately  -Advised patient to continue with post-op shoe on left foot -Advised patient to limit activity to necessity  -Advised patient to elevate as necessary  -Reconsult to Pain management placed; awaiting recs as previous; refilled Norco for last time this visit  -Continue with Lyrica for neuropathy -Return in 4  weeks for follow up care. In the meantime, patient to call office if any issues or problems arise.   [name], DPM

## 2017-03-09 NOTE — Telephone Encounter (Signed)
-----   Message from [name], [place] sent at 03/08/2017 11:00 AM EDT ----- Regarding: Reconsult pain management Patient never went. Please resend consult for neuropathy pain Thanks Dr. Chauncey Cruel

## 2017-03-28 ENCOUNTER — Ambulatory Visit (INDEPENDENT_AMBULATORY_CARE_PROVIDER_SITE_OTHER): Payer: Medicare Other

## 2017-03-28 ENCOUNTER — Encounter: Payer: Self-pay | Admitting: Podiatry

## 2017-03-28 ENCOUNTER — Ambulatory Visit (INDEPENDENT_AMBULATORY_CARE_PROVIDER_SITE_OTHER): Payer: Medicare Other | Admitting: Podiatry

## 2017-03-28 VITALS — BP 147/86 | HR 103 | Temp 98.9°F | Resp 18

## 2017-03-28 DIAGNOSIS — L02611 Cutaneous abscess of right foot: Secondary | ICD-10-CM

## 2017-03-28 DIAGNOSIS — L03115 Cellulitis of right lower limb: Secondary | ICD-10-CM | POA: Diagnosis not present

## 2017-03-28 DIAGNOSIS — M79671 Pain in right foot: Secondary | ICD-10-CM

## 2017-03-28 DIAGNOSIS — L97512 Non-pressure chronic ulcer of other part of right foot with fat layer exposed: Secondary | ICD-10-CM

## 2017-03-28 MED ORDER — CLINDAMYCIN HCL 300 MG PO CAPS: 300.0000 mg | ORAL_CAPSULE | Freq: Three times a day (TID) | ORAL | 2 refills | Status: DC

## 2017-03-28 MED ORDER — HYDROCODONE-ACETAMINOPHEN 10-325 MG PO TABS: 1.0000 | ORAL_TABLET | Freq: Four times a day (QID) | ORAL | 0 refills | Status: DC | PRN

## 2017-03-28 MED ORDER — SILVER SULFADIAZINE 1 % EX CREA: 1.0000 "application " | TOPICAL_CREAM | Freq: Every day | CUTANEOUS | 0 refills | Status: DC

## 2017-03-28 NOTE — Patient Instructions (Signed)
Apply a small amount of silvadene (which I sent to your pharmacy) to the wound daily.   Start antibiotics  Get you blood work today if you can

## 2017-03-30 LAB — CBC WITH DIFFERENTIAL/PLATELET
Basophils Absolute: 0.1 10*3/uL (ref 0.0–0.2)
Basos: 1 %
EOS (ABSOLUTE): 0.3 10*3/uL (ref 0.0–0.4)
Eos: 3 %
Hematocrit: 42.1 % (ref 37.5–51.0)
Hemoglobin: 14.1 g/dL (ref 13.0–17.7)
Immature Grans (Abs): 0 10*3/uL (ref 0.0–0.1)
Immature Granulocytes: 0 %
Lymphocytes Absolute: 3.6 10*3/uL — ABNORMAL HIGH (ref 0.7–3.1)
Lymphs: 38 %
MCH: 31.1 pg (ref 26.6–33.0)
MCHC: 33.5 g/dL (ref 31.5–35.7)
MCV: 93 fL (ref 79–97)
Monocytes Absolute: 0.8 10*3/uL (ref 0.1–0.9)
Monocytes: 9 %
Neutrophils Absolute: 4.6 10*3/uL (ref 1.4–7.0)
Neutrophils: 49 %
Platelets: 324 10*3/uL (ref 150–379)
RBC: 4.53 x10E6/uL (ref 4.14–5.80)
RDW: 13.8 % (ref 12.3–15.4)
WBC: 9.4 10*3/uL (ref 3.4–10.8)

## 2017-03-30 LAB — C-REACTIVE PROTEIN: CRP: 55.8 mg/L — ABNORMAL HIGH (ref 0.0–4.9)

## 2017-03-30 NOTE — Progress Notes (Signed)
Subjective: [name] presents to the office today for new concerns of a wound on the right foot which he noticed over the weekend. It states it is draining and describes it is a bloody to clear. Unsure if there is any pus. He states that his foot is swollen and into his ankle. He has noticed an odor to the ulcer. He is not on antibiotics right now.  Denies any systemic complaints such as fevers, chills, nausea, vomiting. No acute changes since last appointment, and no other complaints at this time.   Objective: AAO x3, NAD; non toxic appearing  DP/PT pulses palpable bilaterally, CRT less than 3 seconds Previous amputation of the hallux. On the plantar aspect is thick hyperkeratotic tissue with a large ulceration in the center. There is dried blood under the callus area. It almost appears to be an area of an old blister. After debridement the ulcer measured about 4 x 3 x 0.5 cm. There is no probing to bone, undermining, or tunneling.There is also a blister on the lateral right 5th digit. During debridement there was purulence expressed. There is edema to the foot and ankle. There is faint erythema to the foot and increase in warmth. There is no ascending cellulitis present. No areas of fluctuance or crepitance.  No open lesions or pre-ulcerative lesions.  No pain with calf compression, swelling, warmth, erythema  Assessment: Foot ulceration, abscess fifth toe, cellulitis  Plan: -All treatment options discussed with the patient including all alternatives, risks, complications.  -X-rays were obtained and reviewed with the patient. No soft tissue emphysema present. No definitive evidence of acute of osteomyelitis -Sharply debrihed wound right foot with a scalpel to granular healthy tissue. Also debrided the blister on t purulence is expressed. After debridement no further pus was expressed. Recommended Silvadene dressing changes daily which is prescribed. -Order clindamycin and ciprofloxacin -Bloodwork  ordered -Continue a surgical shoe -If symptoms worsen or not improving in the next 48 hours he is to go to th ER. He verbalized understating.  -Patient encouraged to call the office with any questions, concerns, change in symptoms.   Celesta Gentile, DPM

## 2017-04-05 ENCOUNTER — Ambulatory Visit: Payer: Medicare Other | Admitting: Sports Medicine

## 2017-04-06 ENCOUNTER — Telehealth: Payer: Self-pay | Admitting: *Deleted

## 2017-04-06 ENCOUNTER — Ambulatory Visit (INDEPENDENT_AMBULATORY_CARE_PROVIDER_SITE_OTHER): Payer: Medicare Other | Admitting: Podiatry

## 2017-04-06 DIAGNOSIS — I70235 Atherosclerosis of native arteries of right leg with ulceration of other part of foot: Secondary | ICD-10-CM

## 2017-04-06 DIAGNOSIS — L97521 Non-pressure chronic ulcer of other part of left foot limited to breakdown of skin: Secondary | ICD-10-CM

## 2017-04-06 DIAGNOSIS — G629 Polyneuropathy, unspecified: Secondary | ICD-10-CM

## 2017-04-06 DIAGNOSIS — Z9889 Other specified postprocedural states: Secondary | ICD-10-CM

## 2017-04-06 DIAGNOSIS — E0842 Diabetes mellitus due to underlying condition with diabetic polyneuropathy: Secondary | ICD-10-CM | POA: Diagnosis not present

## 2017-04-06 DIAGNOSIS — L97512 Non-pressure chronic ulcer of other part of right foot with fat layer exposed: Secondary | ICD-10-CM | POA: Diagnosis not present

## 2017-04-06 DIAGNOSIS — R609 Edema, unspecified: Secondary | ICD-10-CM

## 2017-04-06 DIAGNOSIS — R52 Pain, unspecified: Secondary | ICD-10-CM

## 2017-04-06 DIAGNOSIS — R600 Localized edema: Secondary | ICD-10-CM

## 2017-04-06 DIAGNOSIS — M79672 Pain in left foot: Secondary | ICD-10-CM

## 2017-04-06 DIAGNOSIS — M79671 Pain in right foot: Secondary | ICD-10-CM

## 2017-04-06 MED ORDER — PREGABALIN 150 MG PO CAPS: 150.0000 mg | ORAL_CAPSULE | Freq: Two times a day (BID) | ORAL | 2 refills | Status: DC

## 2017-04-06 MED ORDER — HYDROCODONE-ACETAMINOPHEN 10-325 MG PO TABS: 1.0000 | ORAL_TABLET | Freq: Four times a day (QID) | ORAL | 0 refills | Status: DC | PRN

## 2017-04-06 NOTE — Progress Notes (Signed)
   Subjective:  61 year old male with a history of diabetes mellitus presents today for evaluation of multiple ulcerations to the bilateral feet. Patient has been treated and managed in the past both by Dr. Jacqualyn Posey and Dr. Cannon Kettle.    Objective/Physical Exam General: The patient is alert and oriented x3 in no acute distress.  Dermatology:  Wound #1 noted to the right lateral foot measuring approximately 2.5 1.50.2 cm (LxWxD).   Wound #2 noted to the right medial forefoot toe amputation site measuring approximately [phone] cm.  Wound #3 noted to the medial great toe the left foot measuring approximately [phone] cm  To the noted ulceration(s), there is no eschar. There is a moderate amount of slough, fibrin, and necrotic tissue noted. Granulation tissue and wound base is red. There is a minimal amount of serosanguineous drainage noted. There is no exposed bone muscle-tendon ligament or joint. There is no malodor. Periwound integrity is intact. Skin is warm, dry and supple bilateral lower extremities.  Vascular: Palpable pedal pulses bilaterally. No edema or erythema noted. Capillary refill within normal limits.  Neurological: Epicritic and protective threshold absent bilaterally.   Musculoskeletal Exam: There is some joint stiffness noted with loss of range of motion to the lateral foot and ankle joints. History of right great toe amputation.  Assessment: #1 bilateral lower extremity ulcerations secondary to diabetes mellitus #2 diabetes mellitus w/ peripheral neuropathy #3 right lower extremity edema with tightness   Plan of Care:  #1 Patient was evaluated. #2 medically necessary excisional debridement including muscle and deep fascial tissue was performed using a tissue nipper and a chisel blade. Excisional debridement of all the necrotic nonviable tissue down to healthy bleeding viable tissue was performed with post-debridement measurements same as pre-. #3 the wound was  cleansed and dry sterile dressing applied. #4 continue oral antibiotic clindamycin #5 refill prescription for Lyrica  #6 refill prescription for hydrocodone 5/325mg  #7 today were going to place orders for a venous Doppler right lower extremity to rule out a DVT #8 return to clinic in 1 week with Dr. Debbe Bales, DPM [facility]  Dr. Edrick Kins, [address]                                        [place], [place] [address]                Office [phone]  Fax [phone]

## 2017-04-06 NOTE — Telephone Encounter (Addendum)
-----   Message from Edrick Kins, DPM sent at 04/06/2017  1:09 PM EDT ----- Regarding: Venous doppler Marcy Siren can we order a venous doppler for right lower extremity.   Dx: calf pain with compression. Right lower extremity edema with hardening of the soft tissue.   Thanks, Dr. Amalia Hailey. Faxed orders for venous doppler to [facility].04/08/2017-[facility] - [facility] states pt is negative for DVT.04/21/2017-Dr. Jacqualyn Posey ordered MRI right foot r/o osteomyelitis. Orders given to [facility] for pre-cert and faxed to [place]. Santyl sent to [facility] on required form with clinicals and demographics.06/20/2017-Faxed required form to [facility], and referral to [facility] - Pain Management Attn:  Rennie Plowman, both faxed with clinicals and demographics. Pt asked if he was to take both of the antibiotic, and if he could get another temporary handicap sticker. Dr. Jacqualyn Posey states pt is to take the Levaquin only, and may renew the temporary handicap sticker for 6 more months. I informed pt of Dr. Leigh Aurora orders, and that he had been referred to [facility] to continue the last orders for wound dressing given by Dr. Jacqualyn Posey, and that he had been referred to [facility] for pain management, and he could pick up the handicap sticker in the [place] office.06/21/2017-[facility] - [facility] states pt is negative for DVT.

## 2017-04-08 ENCOUNTER — Ambulatory Visit (HOSPITAL_COMMUNITY)
Admission: RE | Admit: 2017-04-08 | Discharge: 2017-04-08 | Disposition: A | Discharge status: Home / Self Care | Payer: Medicare Other | Source: Ambulatory Visit | Attending: Vascular Surgery | Admitting: Vascular Surgery

## 2017-04-08 DIAGNOSIS — R599 Enlarged lymph nodes, unspecified: Secondary | ICD-10-CM | POA: Diagnosis not present

## 2017-04-08 DIAGNOSIS — M79604 Pain in right leg: Secondary | ICD-10-CM | POA: Diagnosis not present

## 2017-04-08 DIAGNOSIS — R52 Pain, unspecified: Secondary | ICD-10-CM | POA: Diagnosis not present

## 2017-04-08 DIAGNOSIS — R609 Edema, unspecified: Secondary | ICD-10-CM | POA: Diagnosis not present

## 2017-04-14 ENCOUNTER — Encounter: Payer: Self-pay | Admitting: Podiatry

## 2017-04-14 ENCOUNTER — Ambulatory Visit (INDEPENDENT_AMBULATORY_CARE_PROVIDER_SITE_OTHER): Payer: Medicare Other | Admitting: Podiatry

## 2017-04-14 DIAGNOSIS — L97511 Non-pressure chronic ulcer of other part of right foot limited to breakdown of skin: Secondary | ICD-10-CM

## 2017-04-14 DIAGNOSIS — L03115 Cellulitis of right lower limb: Secondary | ICD-10-CM

## 2017-04-14 MED ORDER — LEVOFLOXACIN 500 MG PO TABS: 500.0000 mg | ORAL_TABLET | Freq: Every day | ORAL | 0 refills | Status: DC

## 2017-04-14 MED ORDER — HYDROCODONE-ACETAMINOPHEN 10-325 MG PO TABS: 1.0000 | ORAL_TABLET | Freq: Four times a day (QID) | ORAL | 0 refills | Status: DC | PRN

## 2017-04-17 NOTE — Progress Notes (Signed)
Subjective: 61 year old male presents to the office they for follow-up evaluation of a wound to the right foot and leg as well as cellulitis right foot and leg. He also has been treated chronically for wound to left foot. Overall he has been doing better and is continuing with clindamycin piece to get some tenderness along the calf. He had a venous duplex which was negative. He still gets some warmth the leg but overall he feels that the cellulitis of the leg is improving.Denies any systemic complaints such as fevers, chills, nausea, vomiting. No acute changes since last appointment, and no other complaints at this time.   Objective: AAO x3, NAD DP/PT pulses palpable bilaterally, CRT less than 3 seconds Along the medial right forefoot on the amputation site is a wound measuring approximately 2.5 x 2.5 x 0.27 m in his improvement left point appear to the bilateral foot the wound does measure about seems to 0.5 x 1.5 x 0.2 cm. There does continue to be some localized erythema and warmth to the leg however this does appear to be greatly improved compared to when I last saw him. This no areas of fluctuance or crepitus there is no malodor. There is still tenderness palpation the wound however this is also slowly improving. He also has a chronic wound to the left foot along the hallux. This measures about 0.6 x 0.6 x 0.17 m with a granular wound base. Is no fluctuance or crepitus. No malodor. There is no swelling erythema or ascending cellulitis. No open lesions or pre-ulcerative lesions.  No pain with calf compression, swelling, warmth, erythema  Assessment: Right lower extreme ulcerations 2 with resolving cellulitis, chronic left wound left foot  Plan: -All treatment options discussed with the patient including all alternatives, risks, complications.  -Will change his antibiotics. We'll change to Levaquin. This was prescribed him today. -Continue daily dressing changes as he has been. -Sharply debrided  the when sitting without complications to help a granular wound base. -Will recheck blood work. -Discussed with him that if symptoms worsen or already has any flulike illness or fevers or chills he's immediately to the emergency room. -Patient encouraged to call the office with any questions, concerns, change in symptoms.   Celesta Gentile, DPM

## 2017-04-21 ENCOUNTER — Ambulatory Visit (INDEPENDENT_AMBULATORY_CARE_PROVIDER_SITE_OTHER): Payer: Medicare Other | Admitting: Podiatry

## 2017-04-21 ENCOUNTER — Encounter: Payer: Self-pay | Admitting: Podiatry

## 2017-04-21 DIAGNOSIS — L97511 Non-pressure chronic ulcer of other part of right foot limited to breakdown of skin: Secondary | ICD-10-CM

## 2017-04-21 DIAGNOSIS — L03115 Cellulitis of right lower limb: Secondary | ICD-10-CM | POA: Diagnosis not present

## 2017-04-21 DIAGNOSIS — L97521 Non-pressure chronic ulcer of other part of left foot limited to breakdown of skin: Secondary | ICD-10-CM

## 2017-04-21 MED ORDER — COLLAGENASE 250 UNIT/GM EX OINT: 1.0000 "application " | TOPICAL_OINTMENT | Freq: Every day | CUTANEOUS | 3 refills | Status: DC

## 2017-04-22 ENCOUNTER — Ambulatory Visit
Admission: RE | Admit: 2017-04-22 | Discharge: 2017-04-22 | Disposition: A | Discharge status: Home / Self Care | Payer: Medicare Other | Source: Ambulatory Visit | Attending: Acute Care | Admitting: Acute Care

## 2017-04-22 DIAGNOSIS — F17210 Nicotine dependence, cigarettes, uncomplicated: Secondary | ICD-10-CM

## 2017-04-28 ENCOUNTER — Other Ambulatory Visit: Payer: Self-pay | Admitting: Acute Care

## 2017-04-28 DIAGNOSIS — F17210 Nicotine dependence, cigarettes, uncomplicated: Secondary | ICD-10-CM

## 2017-04-28 NOTE — Progress Notes (Signed)
Subjective: 61 year old male presents to the office they for follow-up evaluation of a wound to the right foot and leg as well as cellulitis right foot and leg. He states that he is doing better. He states it is quite a bit of pain in the right foot although left foot is doing better not causes much pain. He states of the fifth toes was causes more discomfort on the right foot. He denies any drainage or pus. He states the swelling has improved somewhat but is still persistent the foot. Still Gets Some Pain along the Calf Area but This Is Improved. Previously had venous to flex which was negative for DVT. No acute changes since last appointment, and no other complaints at this time.   Objective: AAO x3, NAD DP/PT pulses palpable bilaterally, CRT less than 3 seconds Along the medial right forefoot on the amputation site is a wound measuring approximately 2.4 x 2 x 0.1 cm and there is mild improvement compared to last appointment. Wound base is granular. We does continue on the lateral aspect of the fifth digit as well as measure 0.5 x 0.5 x 0.2 cm with a fibrotic wound base. There is tenderness the right fifth toe. Minimal tenderness to the metatarsal 1 week. There is a previous hallux amputation present. There is mild edema to the right forefoot and there is no significant erythema or ascending cellulitis. This no drainage or pus or any areas of fluctuance or crepitus malodor. On the left foot on their the previous wound that hyperkeratotic lesion. Upon debridement a small superficial wound measuring/0.3 x 0.4 x 0.17 m with a granular wound base. No surrounding erythema, ascending cellulitis. No fluctuance or crepitus or malodor.  Assessment: Right lower extremity ulcerations 2 with resolving cellulitis, chronic left wound left foot  Plan: -All treatment options discussed with the patient including all alternatives, risks, complications.  -Sharply debrided the wounds without any complications. Continue  daily dressing changes. Santyl dressing changes daily. This is ordered today. -Recommended MRI of the right foot which is ordered today. -Surgical shoe -Monitor for any clinical signs or symptoms of infection and directed to call the office immediately should any occur or go to the ER.  Celesta Gentile, DPM

## 2017-04-29 ENCOUNTER — Ambulatory Visit (INDEPENDENT_AMBULATORY_CARE_PROVIDER_SITE_OTHER): Payer: Medicare Other | Admitting: Podiatry

## 2017-04-29 ENCOUNTER — Encounter: Payer: Self-pay | Admitting: Podiatry

## 2017-04-29 DIAGNOSIS — M79672 Pain in left foot: Secondary | ICD-10-CM

## 2017-04-29 DIAGNOSIS — Z9889 Other specified postprocedural states: Secondary | ICD-10-CM

## 2017-04-29 DIAGNOSIS — L97521 Non-pressure chronic ulcer of other part of left foot limited to breakdown of skin: Secondary | ICD-10-CM

## 2017-04-29 DIAGNOSIS — G629 Polyneuropathy, unspecified: Secondary | ICD-10-CM | POA: Diagnosis not present

## 2017-04-29 DIAGNOSIS — L97511 Non-pressure chronic ulcer of other part of right foot limited to breakdown of skin: Secondary | ICD-10-CM

## 2017-04-29 MED ORDER — CLINDAMYCIN HCL 300 MG PO CAPS: 300.0000 mg | ORAL_CAPSULE | Freq: Three times a day (TID) | ORAL | 2 refills | Status: DC

## 2017-04-29 MED ORDER — HYDROCODONE-ACETAMINOPHEN 10-325 MG PO TABS: 1.0000 | ORAL_TABLET | Freq: Four times a day (QID) | ORAL | 0 refills | Status: DC | PRN

## 2017-05-04 NOTE — Progress Notes (Signed)
Subjective: 61 year old male presents to the office they for follow-up evaluation of a wound to the right foot and leg as well as cellulitis right foot and leg. He states that he has not heard back about the MRI. He states he gets some sharp pains of the big toe on the left side and the right fifth toe. Denies any drainageor pus. He states that he still has some mild redness however the swelling has improved. No acute changes since last appointment, and no other complaints at this time.  Denies any systemic complaints such as fevers, chills, nausea, vomiting.   Objective: AAO x3, NAD DP/PT pulses palpable bilaterally, CRT less than 3 seconds Along the medial right forefoot on the amputation site is a superficial wound measuring approximately 2.4 x 0.8 x 0.1 with a granular wound base. No surrounding erythema ascending cellulitis. Ere is no fluctuance, crepitance. Wound on the lateral aspect of the fifth digit as well as measure 0.5 x 0.5 x 0.1 cm with a fibrotic wound base. There is tenderness the right fifth toe. There is no surrounding erythema, ascending cellulitis, fluctuance, crepitance.  On the left hallux is significant hyperkeratotic tissue build up. Upon debridement there is a superficial area of breakdown but there is no probing, undermining, tunneling. There is no surroundingerythema or ascending cellulitis. No other open lesions or pre-ulcerative lesions. There is no pain with calf compression, swelling, warmth, erythema.  Assessment: Right lower extremity ulcerations 2, chronic left wound left foot  Plan: -All treatment options discussed with the patient including all alternatives, risks, complications.  -Sharply debrided the wounds without any complications. Continue daily dressing changes with santyl -Awaiting MRI- we gave him the information to schedule this.  -Surgical shoe- continue -Pain medication prn. This is a chronic issue for him and he is awaiting an appointment. He has  called [facility] pain management several times.  -Monitor for any clinical signs or symptoms of infection and directed to call the office immediately should any occur or go to the ER.  Celesta Gentile, DPM

## 2017-05-11 ENCOUNTER — Telehealth: Payer: Self-pay | Admitting: Podiatry

## 2017-05-11 ENCOUNTER — Other Ambulatory Visit: Payer: Medicare Other

## 2017-05-11 ENCOUNTER — Ambulatory Visit
Admission: RE | Admit: 2017-05-11 | Discharge: 2017-05-11 | Disposition: A | Discharge status: Home / Self Care | Payer: Medicare Other | Source: Ambulatory Visit | Attending: Podiatry | Admitting: Podiatry

## 2017-05-11 DIAGNOSIS — Z01812 Encounter for preprocedural laboratory examination: Secondary | ICD-10-CM

## 2017-05-11 MED ORDER — GADOBENATE DIMEGLUMINE 529 MG/ML IV SOLN
20.0000 mL | Freq: Once | INTRAVENOUS | Status: AC | PRN
  Administered 2017-05-11: 20 mL via INTRAVENOUS

## 2017-05-11 NOTE — Telephone Encounter (Signed)
[name] with [facility] called with a call report on patient's MRI of the Right Foot done this morning. Stated to call back to speak with a radiologist at [phone].

## 2017-05-11 NOTE — Telephone Encounter (Signed)
I spoke with Erline Levine and she states she wanted to make sure Dr. Jacqualyn Posey received today's MRI results, there is evidence of abscesses.

## 2017-05-13 ENCOUNTER — Ambulatory Visit (INDEPENDENT_AMBULATORY_CARE_PROVIDER_SITE_OTHER): Payer: Medicare Other | Admitting: Podiatry

## 2017-05-13 ENCOUNTER — Encounter: Payer: Self-pay | Admitting: Podiatry

## 2017-05-13 DIAGNOSIS — L97511 Non-pressure chronic ulcer of other part of right foot limited to breakdown of skin: Secondary | ICD-10-CM

## 2017-05-13 DIAGNOSIS — L0291 Cutaneous abscess, unspecified: Secondary | ICD-10-CM | POA: Diagnosis not present

## 2017-05-13 MED ORDER — HYDROCODONE-ACETAMINOPHEN 10-325 MG PO TABS: 1.0000 | ORAL_TABLET | Freq: Four times a day (QID) | ORAL | 0 refills | Status: DC | PRN

## 2017-05-13 NOTE — Patient Instructions (Signed)

## 2017-05-16 NOTE — Telephone Encounter (Addendum)
-----   Message from Trula Slade, DPM sent at 05/16/2017  7:36 AM EDT ----- I ordered blood work on him a few weeks ago but don't have any results. Can you look to see if it was drawn? Thanks. If not can you call him to get it done before surgery on Wednesday? Thanks. I spoke with pt, he states he gave you a copy of the labs from the 1st part of May, and it is available in [place]. I told pt Dr. Jacqualyn Posey wanted the labs performed prior to his surgery Wednesday.

## 2017-05-16 NOTE — Telephone Encounter (Signed)
Yeah, I do want to get the Sed rate done at least. I would prefer to get it done before surgery so that way it is not falsely elevated.  I did see the other blood work. Thanks.

## 2017-05-16 NOTE — Progress Notes (Signed)
Subjective: [name] presents the office they for follow-up violation of wounds to both of his feet. He states that they continue to become painful. Also states that he gets some occasional pain to other areas of the right foot and he thinks is fairly comfortable infection. He has continue antibiotics. His condyle clindamycin. He denies any drainage or pus coming from the wounds other than small amount of bloody drainage. Denies any systemic complaints such as fevers, chills, nausea, vomiting. No acute changes since last appointment, and no other complaints at this time.   Objective: AAO x3, NAD DP/PT pulses palpable bilaterally, CRT less than 3 seconds Protective sensation decreased with Simms Weinstein monofilament On the right foot submetatarsal one is a hyperkeratotic lesion with central brain and the wound. There is tenderness palpation of this area but there is no area of fluctuance or crepitus. Also ulceration presents the lateral the fifth digit consistent superficial Appears to be improved. Small amount of fibrotic tissue present. Is no surrounding edema, erythema, drainage or pus there is no fluctuance or crepitus. On the left foot along the hallux is a hyperkeratotic lesion with central superficial abrasion type lesion. No drainage or pus or fluctuance or crepitus. To the right foot there does appear to be some mild increase in swelling compared to contralateral extremity. There is no significant erythema or ascending synovitis. No open lesions or pre-ulcerative lesions.  No pain with calf compression, swelling, warmth, erythema  MRI 05/11/2017 IMPRESSION: Several small abscesses near the amputation site of the great toe with a draining/open wound. Associated surrounding cellulitis.  No MR findings to suggest septic arthritis, osteomyelitis or pyomyositis.  These results will be called to the ordering clinician or representative by the Radiologist Assistant, and  communication documented in the PACS or zVision Dashboard.  Assessment: Chronic ulcerations bilateral with abscess right foot  Plan: -All treatment options discussed with the patient including all alternatives, risks, complications.  -At today's appointment he'll the MRI findings with the patient. I lightly debrided the area was unable to appreciate any abscess. Given the MRI findings and persistent wounds I recommended surgical intervention at this point. Recommended wound debridements bilaterally as well as an incision drainage on the right foot. I discussed the surgery as well as the postoperative course. Discussed the risks of surgery with him and he wishes to go ahead and proceed. -Will plan surgery next week. -The incision placement as well as the postoperative course was discussed with the patient. I discussed risks of the surgery which include, but not limited to, infection, bleeding, pain, swelling, need for further surgery, delayed or nonhealing, painful or ugly scar, numbness or sensation changes, over/under correction, recurrence, transfer lesions, further deformity, hardware failure, DVT/PE, loss of toe/foot. Patient understands these risks and wishes to proceed with surgery. The surgical consent was reviewed with the patient all 3 pages were signed. No promises or guarantees were given to the outcome of the procedure. All questions were answered to the best of my ability. Before the surgery the patient was encouraged to call the office if there is any further questions. The surgery will be performed at the [facility] on an outpatient basis.  Celesta Gentile, DPM

## 2017-05-18 ENCOUNTER — Encounter: Payer: Self-pay | Admitting: Podiatry

## 2017-05-18 DIAGNOSIS — L03115 Cellulitis of right lower limb: Secondary | ICD-10-CM | POA: Diagnosis not present

## 2017-05-18 DIAGNOSIS — L03116 Cellulitis of left lower limb: Secondary | ICD-10-CM | POA: Diagnosis not present

## 2017-05-18 LAB — CBC WITH DIFFERENTIAL/PLATELET
Basophils Absolute: 0 10*3/uL (ref 0.0–0.2)
Basos: 0 %
EOS (ABSOLUTE): 0.3 10*3/uL (ref 0.0–0.4)
Eos: 3 %
Hematocrit: 42.7 % (ref 37.5–51.0)
Hemoglobin: 14.2 g/dL (ref 13.0–17.7)
Immature Grans (Abs): 0 10*3/uL (ref 0.0–0.1)
Immature Granulocytes: 0 %
Lymphocytes Absolute: 3.9 10*3/uL — ABNORMAL HIGH (ref 0.7–3.1)
Lymphs: 40 %
MCH: 29.3 pg (ref 26.6–33.0)
MCHC: 33.3 g/dL (ref 31.5–35.7)
MCV: 88 fL (ref 79–97)
Monocytes Absolute: 0.8 10*3/uL (ref 0.1–0.9)
Monocytes: 9 %
Neutrophils Absolute: 4.7 10*3/uL (ref 1.4–7.0)
Neutrophils: 48 %
Platelets: 317 10*3/uL (ref 150–379)
RBC: 4.85 x10E6/uL (ref 4.14–5.80)
RDW: 14.1 % (ref 12.3–15.4)
WBC: 9.6 10*3/uL (ref 3.4–10.8)

## 2017-05-18 LAB — C-REACTIVE PROTEIN: CRP: 37.4 mg/L — ABNORMAL HIGH (ref 0.0–4.9)

## 2017-05-18 LAB — BASIC METABOLIC PANEL
BUN/Creatinine Ratio: 12 (ref 9–20)
BUN: 13 mg/dL (ref 6–24)
CO2: 21 mmol/L (ref 20–29)
Calcium: 9 mg/dL (ref 8.7–10.2)
Chloride: 102 mmol/L (ref 96–106)
Creatinine, Ser: 1.05 mg/dL (ref 0.76–1.27)
GFR calc Af Amer: 89 mL/min/{1.73_m2} (ref >59)
GFR calc non Af Amer: 77 mL/min/{1.73_m2} (ref >59)
Glucose: 126 mg/dL — ABNORMAL HIGH (ref 65–99)
Potassium: 4.7 mmol/L (ref 3.5–5.2)
Sodium: 141 mmol/L (ref 134–144)

## 2017-05-18 LAB — SEDIMENTATION RATE: Sed Rate: 30 mm/hr (ref 0–30)

## 2017-05-20 ENCOUNTER — Ambulatory Visit (INDEPENDENT_AMBULATORY_CARE_PROVIDER_SITE_OTHER): Payer: Medicare Other | Admitting: Podiatry

## 2017-05-20 ENCOUNTER — Encounter: Payer: Self-pay | Admitting: Podiatry

## 2017-05-20 DIAGNOSIS — L97512 Non-pressure chronic ulcer of other part of right foot with fat layer exposed: Secondary | ICD-10-CM

## 2017-05-20 DIAGNOSIS — L0291 Cutaneous abscess, unspecified: Secondary | ICD-10-CM

## 2017-05-20 DIAGNOSIS — L97511 Non-pressure chronic ulcer of other part of right foot limited to breakdown of skin: Secondary | ICD-10-CM

## 2017-05-20 MED ORDER — HYDROCODONE-ACETAMINOPHEN 10-325 MG PO TABS: 1.0000 | ORAL_TABLET | Freq: Four times a day (QID) | ORAL | 0 refills | Status: DC | PRN

## 2017-05-22 NOTE — Progress Notes (Signed)
Subjective: [name] is a 61 y.o. is seen today in office s/p bilateral foot wound debridements and right foot I&D preformed on 05/18/2017. They state their pain is controlled but he states he does not like the percocet and is asking for the vicodn. He is awaiting pain management referral. He states that he has called them several of times but not able to get in. In regards to the surgery he has kept his foot elevated and remained in the surgical shoes. Denies any systemic complaints such as fevers, chills, nausea, vomiting. No calf pain, chest pain, shortness of breath.   Objective: General: No acute distress, AAOx3  DP/PT pulses palpable 2/4, CRT < 3 sec to all digits.  Protective sensation decreased On the left foot there is a small ulceration along the medial hallux measuring approximately 0.5 x 0.2 x 0.2 cm. There is no probing to bone, undermining or tunneling. There is no swelling erythema, ascending cellulitis. No clinical signs of infection. On the right foot there is a small ulceration on the lateral aspect measuring 0.3 x 0.2 x 0.1 cm. There is no surrounding erythema or  ascending cellulitis.  On the right foot submetatarsal 1 the wound continues measuring 2 x 1.5 cm. There is a area that does probe about 3cm. Serosanguineous drainage was expressed but no pus. There is no swelling erythema, ascending synovitis. This no fluctuance, crepitus, malodor. There is tenderness in this area. No other areas of tenderness to bilateral lower extremities.  No other open lesions or pre-ulcerative lesions.  No pain with calf compression, swelling, warmth, erythema.   Assessment and Plan:  Status post bilateral foot debridement, right I&D , doing well  -Treatment options discussed including all alternatives, risks, and complications -Dressing was changed bilaterally. Discussed with him on the right side daily packing to the submetatarsal ulceration. We showed him how to change the bandage and he  states that he continue this at home. If he is not able he is been a call the office and I'll see him on Monday and we will set up home health care where he would do it himself. -Elevation -Continue surgical shoes.  -Refilled vicodin- will follow up with pain management referral.  -Monitor for any clinical signs or symptoms of infection and DVT/PE and directed to call the office immediately should any occur or go to the ER. -Follow-up in 1 week or sooner if any problems arise. In the meantime, encouraged to call the office with any questions, concerns, change in symptoms.   Celesta Gentile, DPM

## 2017-05-23 ENCOUNTER — Telehealth: Payer: Self-pay | Admitting: *Deleted

## 2017-05-23 DIAGNOSIS — R609 Edema, unspecified: Secondary | ICD-10-CM

## 2017-05-23 DIAGNOSIS — R52 Pain, unspecified: Secondary | ICD-10-CM

## 2017-05-23 NOTE — Telephone Encounter (Addendum)
-----   Message from Trula Slade, DPM sent at 05/22/2017  7:05 PM EDT ----- We had put in for a pain management consult for him awhile ago but he has not been able to get in. Is there anyway you can follow-up on this? Thank you. 05/23/2017-Refaxed referral and current clinicals and demographics to [facility].

## 2017-05-27 ENCOUNTER — Encounter: Payer: Self-pay | Admitting: Podiatry

## 2017-05-27 ENCOUNTER — Ambulatory Visit (INDEPENDENT_AMBULATORY_CARE_PROVIDER_SITE_OTHER): Payer: Self-pay | Admitting: Podiatry

## 2017-05-27 DIAGNOSIS — L97511 Non-pressure chronic ulcer of other part of right foot limited to breakdown of skin: Secondary | ICD-10-CM

## 2017-05-27 DIAGNOSIS — L97512 Non-pressure chronic ulcer of other part of right foot with fat layer exposed: Secondary | ICD-10-CM

## 2017-05-27 MED ORDER — HYDROCODONE-ACETAMINOPHEN 10-325 MG PO TABS: 1.0000 | ORAL_TABLET | Freq: Four times a day (QID) | ORAL | 0 refills | Status: DC | PRN

## 2017-05-27 MED ORDER — CIPROFLOXACIN HCL 500 MG PO TABS: 500.0000 mg | ORAL_TABLET | Freq: Two times a day (BID) | ORAL | 0 refills | Status: DC

## 2017-05-27 NOTE — Telephone Encounter (Addendum)
-----   Message from Trula Slade, DPM sent at 05/27/2017 11:51 AM EDT ----- Please add ciprofloxacin 500mg  BID x 2 weeks. 05/27/2017-Left message informing pt of Dr. Leeanne Rio orders.

## 2017-05-27 NOTE — Progress Notes (Signed)
DOS 06.20.2018 Left and right foot wound debridement (cleaning of the ulcer) with an incision and drainage of the right.

## 2017-05-31 ENCOUNTER — Ambulatory Visit (INDEPENDENT_AMBULATORY_CARE_PROVIDER_SITE_OTHER): Payer: Medicare Other | Admitting: Sports Medicine

## 2017-05-31 VITALS — Temp 98.2°F

## 2017-05-31 DIAGNOSIS — Z9889 Other specified postprocedural states: Secondary | ICD-10-CM

## 2017-05-31 DIAGNOSIS — L97511 Non-pressure chronic ulcer of other part of right foot limited to breakdown of skin: Secondary | ICD-10-CM

## 2017-05-31 DIAGNOSIS — L97521 Non-pressure chronic ulcer of other part of left foot limited to breakdown of skin: Secondary | ICD-10-CM

## 2017-05-31 DIAGNOSIS — L0291 Cutaneous abscess, unspecified: Secondary | ICD-10-CM

## 2017-05-31 MED ORDER — HYDROCODONE-ACETAMINOPHEN 10-325 MG PO TABS: 1.0000 | ORAL_TABLET | Freq: Four times a day (QID) | ORAL | 0 refills | Status: DC | PRN

## 2017-05-31 NOTE — Telephone Encounter (Signed)
Spoke with sherry and she stated that [name] had already called her this am. Lattie Haw

## 2017-05-31 NOTE — Telephone Encounter (Signed)
[name] with [facility] called saying that we still need to add the frequency of dressing change for the 4x4 gauze to the order and get that sent to her. If you have any questions, you can reach her at [phone].

## 2017-05-31 NOTE — Progress Notes (Signed)
Subjective: [name] is a 61 y.o. male patient seen in office for follow up evaluation s/p bilateral wound debridement and I&D on 05-18-17. Patient is packing the right sub met 1 ulceration and applying antibiotic cream to other sites. Reports that he is on PO antibiotics. Denies nausea/fever/vomiting/chills/night sweats/shortness of breath/pain. Patient has no other pedal complaints at this time.  Patient Active Problem List   Diagnosis Date Noted  . Chronic left-sided low back pain without sciatica 11/08/2016  . Toe ulcer ([place]) 03/08/2016  . Verruca 12/28/2015  . Pre-ulcerative calluses 12/28/2015  . Hx of adenomatous colonic polyps 06/09/2015  . Lipoma 03/13/2014  . Plantar warts 07/09/2012  . Cellulitis 07/04/2012  . Neuropathy 07/04/2012  . Hyperglycemia 07/04/2012  . HYPERTENSION, BENIGN 10/20/2010  . EDEMA 08/20/2010  . WISDOM TEETH EXTRACTION, HX OF 08/20/2010  . HYPOALBUMINEMIA 08/19/2010  . ANEMIA 08/19/2010  . NICOTINE ADDICTION 08/19/2010  . NECROTIZING PNEUMONIA 08/19/2010  . OSTEOARTHRITIS 08/19/2010   Current Outpatient Prescriptions on File Prior to Visit  Medication Sig Dispense Refill  . atorvastatin (LIPITOR) 10 MG tablet Take 10 mg by mouth at bedtime.  5  . buPROPion (WELLBUTRIN SR) 150 MG 12 hr tablet Take 150 mg by mouth 2 (two) times daily.    . CHANTIX CONTINUING MONTH PAK 1 MG tablet TAKE AS PER PACKAGE DIRECTIONS  1  . CHANTIX STARTING MONTH PAK 0.5 MG X 11 & 1 MG X 42 tablet TAKE AS PRESCRIBED  0  . ciprofloxacin (CIPRO) 500 MG tablet Take 1 tablet (500 mg total) by mouth 2 (two) times daily. 28 tablet 0  . clindamycin (CLEOCIN) 300 MG capsule Take 1 capsule (300 mg total) by mouth 3 (three) times daily. 30 capsule 2  . collagenase (SANTYL) ointment Apply 1 application topically daily. 15 g 3  . docusate sodium (COLACE) 100 MG capsule TAKE ONE CAPSULE BY MOUTH EVERY 6 HOURS AS NEEDED FOR CONSTIPATION  0  . docusate sodium (COLACE) 100 MG capsule  Take 100 mg by mouth daily as needed for mild constipation.    . fluorouracil (EFUDEX) 5 % cream Apply topically 2 (two) times daily. 40 g 0  . gabapentin (NEURONTIN) 300 MG capsule Take 300 mg by mouth 3 (three) times daily.    Marland Kitchen levofloxacin (LEVAQUIN) 500 MG tablet Take 1 tablet (500 mg total) by mouth daily. 7 tablet 0  . Multiple Vitamin (MULTIVITAMIN) tablet Take 1 tablet by mouth daily.    . mupirocin cream (BACTROBAN) 2 % Apply 1 application topically 2 (two) times daily. 15 g 0  . oxyCODONE-acetaminophen (PERCOCET/ROXICET) 5-325 MG tablet Take 1-2 tablets by mouth every 4 (four) hours as needed for severe pain (Take 1-2 tablets by mouth every four to six hours as needed for pain).    . predniSONE (DELTASONE) 10 MG tablet Take 2 tablets (20 mg total) by mouth daily with breakfast. Take 20 mg a day for 2 weeks and then take 10 mg a day for 2 weeks. 60 tablet 0  . pregabalin (LYRICA) 150 MG capsule Take 1 capsule (150 mg total) by mouth 2 (two) times daily. 90 capsule 2  . PROAIR HFA 108 (90 Base) MCG/ACT inhaler     . silver sulfADIAZINE (SILVADENE) 1 % cream Apply 1 application topically daily. 50 g 0  . SPIRIVA HANDIHALER 18 MCG inhalation capsule INHALE 1 CAPSULE VIA HANDIHALER ONCE DAILY AT THE SAME TIME EVERY DAY  5  . SYMBICORT 160-4.5 MCG/ACT inhaler     . Wound  Dressings (CURASOL WOUND DRESSING) GEL Apply 1 application topically daily. 30 g 0  . Wound Dressings (MEDIHONEY WOUND/BURN DRESSING) GEL Apply to affected area daily. 1 Tube 3   Current Facility-Administered Medications on File Prior to Visit  Medication Dose Route Frequency Provider Last Rate Last Dose  . lidocaine (PF) (XYLOCAINE) 1 % injection 0.3 mL  0.3 mL Other Once Magnus Sinning, MD      . methylPREDNISolone acetate (DEPO-MEDROL) injection 80 mg  80 mg Other Once Magnus Sinning, MD       No Known Allergies  Recent Results (from the past 2160 hour(s))  CBC with Differential     Status: Abnormal   Collection  Time: 03/29/17  8:29 AM  Result Value Ref Range   WBC 9.4 3.4 - 10.8 x10E3/uL   RBC 4.53 4.14 - 5.80 x10E6/uL   Hemoglobin 14.1 13.0 - 17.7 g/dL   Hematocrit 42.1 37.5 - 51.0 %   MCV 93 79 - 97 fL   MCH 31.1 26.6 - 33.0 pg   MCHC 33.5 31.5 - 35.7 g/dL   RDW 13.8 12.3 - 15.4 %   Platelets 324 150 - 379 x10E3/uL   Neutrophils 49 Not Estab. %   Lymphs 38 Not Estab. %   Monocytes 9 Not Estab. %   Eos 3 Not Estab. %   Basos 1 Not Estab. %   Neutrophils Absolute 4.6 1.4 - 7.0 x10E3/uL   Lymphocytes Absolute 3.6 (H) 0.7 - 3.1 x10E3/uL   Monocytes Absolute 0.8 0.1 - 0.9 x10E3/uL   EOS (ABSOLUTE) 0.3 0.0 - 0.4 x10E3/uL   Basophils Absolute 0.1 0.0 - 0.2 x10E3/uL   Immature Granulocytes 0 Not Estab. %   Immature Grans (Abs) 0.0 0.0 - 0.1 x10E3/uL  C-reactive protein     Status: Abnormal   Collection Time: 03/29/17  8:29 AM  Result Value Ref Range   CRP 55.8 (H) 0.0 - 4.9 mg/L  Sedimentation rate     Status: None   Collection Time: 05/17/17  7:31 AM  Result Value Ref Range   Sed Rate 30 0 - 30 mm/hr  C-reactive protein     Status: Abnormal   Collection Time: 05/17/17  7:31 AM  Result Value Ref Range   CRP 37.4 (H) 0.0 - 4.9 mg/L  CBC with Differential/Platelet     Status: Abnormal   Collection Time: 05/17/17  7:31 AM  Result Value Ref Range   WBC 9.6 3.4 - 10.8 x10E3/uL   RBC 4.85 4.14 - 5.80 x10E6/uL   Hemoglobin 14.2 13.0 - 17.7 g/dL   Hematocrit 42.7 37.5 - 51.0 %   MCV 88 79 - 97 fL   MCH 29.3 26.6 - 33.0 pg   MCHC 33.3 31.5 - 35.7 g/dL   RDW 14.1 12.3 - 15.4 %   Platelets 317 150 - 379 x10E3/uL   Neutrophils 48 Not Estab. %   Lymphs 40 Not Estab. %   Monocytes 9 Not Estab. %   Eos 3 Not Estab. %   Basos 0 Not Estab. %   Neutrophils Absolute 4.7 1.4 - 7.0 x10E3/uL   Lymphocytes Absolute 3.9 (H) 0.7 - 3.1 x10E3/uL   Monocytes Absolute 0.8 0.1 - 0.9 x10E3/uL   EOS (ABSOLUTE) 0.3 0.0 - 0.4 x10E3/uL   Basophils Absolute 0.0 0.0 - 0.2 x10E3/uL   Immature Granulocytes 0  Not Estab. %   Immature Grans (Abs) 0.0 0.0 - 0.1 G88P1/SR  Basic metabolic panel     Status: Abnormal   Collection  Time: 05/17/17  7:31 AM  Result Value Ref Range   Glucose 126 (H) 65 - 99 mg/dL   BUN 13 6 - 24 mg/dL   Creatinine, Ser 2.12 0.76 - 1.27 mg/dL   GFR calc non Af Amer 77 >59 mL/min/1.73   GFR calc Af Amer 89 >59 mL/min/1.73   BUN/Creatinine Ratio 12 9 - 20   Sodium 141 134 - 144 mmol/L   Potassium 4.7 3.5 - 5.2 mmol/L   Chloride 102 96 - 106 mmol/L   CO2 21 20 - 29 mmol/L    Comment:               **Please note reference interval change**   Calcium 9.0 8.7 - 10.2 mg/dL    Objective: There were no vitals filed for this visit.  General: Patient is awake, alert, oriented x 3 and in no acute distress.  Dermatology: Skin is warm and dry bilateral with a partial thickness ulceration present  5th toe on right. Ulceration measures 0.5 cm x 0.3 cm x 0.2 cm. There is a  keratotic border with a granular base. The ulcerationdoes not  probe to bone. There is no malodor, no active drainage, no erythema, mild edema. No other acute signs of infection.  Right sub met 1 there is a partial thickness ulceration present  Sub met 1 on right. Ulceration measures 2 cm x 1 cm x 2 cm at the 9 oclock position of the wound where patient has been applying packing with clear drainage. There is a  keratotic border with a granular base. The ulceration does not  probe to bone, probes to soft tissue end range. There is no malodor, no erythema, mild edema. + pain. No other acute signs of infection.  To left plantar hallux there is a partial thickness ulceration present at area of previous wart. Ulceration measures 0.2 cm x 0.1 cm x 0.1 cm. There is a keratotic border with a granular base. The ulceration does not probe to bone. There is no malodor, no active drainage, no erythema, no edema. No other acute signs of infection.   Vascular: Dorsalis Pedis pulse = 2/4 Bilateral,  Posterior Tibial pulse = 1/4  Bilateral,  Capillary Fill Time < 5 seconds  Neurologic: Protective sensation decreased bilateral.  Musculosketal: + Pain to ulcerated areas. S/p Right hallux amputation.   No results for input(s): GRAMSTAIN, LABORGA in the last 8760 hours.  Assessment and Plan:  Problem List Items Addressed This Visit    None    Visit Diagnoses    Toe ulcer, left, limited to breakdown of skin (HCC)    -  Primary   Skin ulcer of right foot, limited to breakdown of skin (HCC)       Relevant Medications   HYDROcodone-acetaminophen (NORCO) 10-325 MG tablet   Abscess       Toe ulcer, right, limited to breakdown of skin (HCC)       S/P bilateral foot surgery         -Examined patient and discussed the progression of the wound and treatment alternatives. - Excisionally dedbrided ulceration to healthy bleeding borders using a sterile chisel blade. -Applied offloading pad, packing right sub met 1 and antibiotic cream to all other ulcerations covered with dry sterile dressing and instructed patient to continue with daily dressings at home consisting of the same -Continue with post op shoes and limited walking and standing  -Continue with PO antibiotics -Refilled Vicodin and advised patient to follow up with Pain  management  - Advised patient to go to the ER or return to office if the wound worsens or if constitutional symptoms are present. -Patient to return to office in 1 week for follow up care and evaluation or sooner if problems arise.  [name], DPM

## 2017-06-06 NOTE — Progress Notes (Signed)
Subjective: [name] is a 61 y.o. is seen today in office s/p bilateral foot wound debridements and right foot I&D preformed on 05/18/2017. He states he has been packing the wound on the right foot for the first couple days after his last appointment he stopped that she cannot get packing in the wound. He states the pain that he was getting on the right side has improved since I last saw him. Denies any drainage or pus and denies any increase in redness or red streaks. The left side is doing well he is more concerned about the aesthetics the left toe at this time. He has the wound does not heal and left foot. Denies any drainage or pus. He has no other concerns today. Denies any systemic complaints such as fevers, chills, nausea, vomiting. No calf pain, chest pain, shortness of breath.   Objective: General: No acute distress, AAOx3  DP/PT pulses palpable 2/4, CRT < 3 sec to all digits.  Protective sensation decreased On the left foot there is a small ulceration along the medial hallux measuring approximately 0.2 x 0.2 x 0.1 cm. There is no probing to bone, undermining or tunneling. There is no swelling erythema, ascending cellulitis. No clinical signs of infection. On the right foot there is a small ulceration on the lateral aspect measuring 0.3 x 0.2 x 0.1 cm. There is no surrounding erythema or  ascending cellulitis.  On the right foot submetatarsal 1 the wound continues measuring 1.5 x 1.5 cm. There is a area that does probe about 2.5cm. Serosanguineous drainage was expressed but no pus. There is no swelling erythema, ascending synovitis. This no fluctuance, crepitus, malodor. There is tenderness in this area. No other areas of tenderness to bilateral lower extremities.  No other open lesions or pre-ulcerative lesions.  No pain with calf compression, swelling, warmth, erythema.   Assessment and Plan:  Status post bilateral foot debridement, right I&D , doing well and pain is improving.    -Treatment options discussed including all alternatives, risks, and complications -Dressing was changed bilaterally. The wound was irrigated on the right foot and discussed with him packing urinate given repacked the wound today he states he can do some self at home and declines home health. -Finish course of antibiotics. -Elevation -Continue surgical shoes.  -Refilled vicodin- He state he has followed up with pain management but they have not returned any of his calls. We will again resend information.  -Monitor for any clinical signs or symptoms of infection and DVT/PE and directed to call the office immediately should any occur or go to the ER. -Follow-up on Tuesday with Dr. Cannon Kettle or sooner if any problems arise. In the meantime, encouraged to call the office with any questions, concerns, change in symptoms.   Celesta Gentile, DPM

## 2017-06-13 ENCOUNTER — Encounter: Payer: Self-pay | Admitting: Podiatry

## 2017-06-13 ENCOUNTER — Ambulatory Visit (INDEPENDENT_AMBULATORY_CARE_PROVIDER_SITE_OTHER): Payer: Self-pay | Admitting: Podiatry

## 2017-06-13 VITALS — Temp 98.3°F

## 2017-06-13 DIAGNOSIS — L97512 Non-pressure chronic ulcer of other part of right foot with fat layer exposed: Secondary | ICD-10-CM

## 2017-06-13 MED ORDER — CLINDAMYCIN HCL 300 MG PO CAPS: 300.0000 mg | ORAL_CAPSULE | Freq: Three times a day (TID) | ORAL | 2 refills | Status: DC

## 2017-06-13 MED ORDER — HYDROCODONE-ACETAMINOPHEN 10-325 MG PO TABS: 1.0000 | ORAL_TABLET | Freq: Four times a day (QID) | ORAL | 0 refills | Status: DC | PRN

## 2017-06-13 MED ORDER — CIPROFLOXACIN HCL 500 MG PO TABS: 500.0000 mg | ORAL_TABLET | Freq: Two times a day (BID) | ORAL | 0 refills | Status: DC

## 2017-06-14 ENCOUNTER — Other Ambulatory Visit: Payer: Self-pay | Admitting: Podiatry

## 2017-06-14 DIAGNOSIS — L97511 Non-pressure chronic ulcer of other part of right foot limited to breakdown of skin: Secondary | ICD-10-CM | POA: Diagnosis not present

## 2017-06-15 LAB — CBC WITH DIFFERENTIAL/PLATELET
Basophils Absolute: 0 10*3/uL (ref 0.0–0.2)
Basos: 0 %
EOS (ABSOLUTE): 0.3 10*3/uL (ref 0.0–0.4)
Eos: 3 %
Hematocrit: 41.1 % (ref 37.5–51.0)
Hemoglobin: 13.7 g/dL (ref 13.0–17.7)
Immature Grans (Abs): 0 10*3/uL (ref 0.0–0.1)
Immature Granulocytes: 0 %
Lymphocytes Absolute: 3.8 10*3/uL — ABNORMAL HIGH (ref 0.7–3.1)
Lymphs: 39 %
MCH: 30.6 pg (ref 26.6–33.0)
MCHC: 33.3 g/dL (ref 31.5–35.7)
MCV: 92 fL (ref 79–97)
Monocytes Absolute: 0.9 10*3/uL (ref 0.1–0.9)
Monocytes: 10 %
Neutrophils Absolute: 4.6 10*3/uL (ref 1.4–7.0)
Neutrophils: 48 %
Platelets: 297 10*3/uL (ref 150–379)
RBC: 4.48 x10E6/uL (ref 4.14–5.80)
RDW: 13.7 % (ref 12.3–15.4)
WBC: 9.7 10*3/uL (ref 3.4–10.8)

## 2017-06-15 LAB — SEDIMENTATION RATE: Sed Rate: 14 mm/hr (ref 0–30)

## 2017-06-15 LAB — C-REACTIVE PROTEIN: CRP: 23.4 mg/L — ABNORMAL HIGH (ref 0.0–4.9)

## 2017-06-16 NOTE — Progress Notes (Signed)
Subjective: Mr. Schiele presents the office they for follow-up evaluation of bilateral foot wounds. He states the left side is doing well however the right side still does continue. The pain of the fifth toes improving he thinks is a bandage. He certainly gets some increasing pain on the wound to the right foot submetatarsal one. States that he gets occasional pain that radiated legs. Denies any red streaking denies any pus. He gets some clear bloody drainage coming from the wound mostly on the right side. Denies any systemic complaints such as fevers, chills, nausea, vomiting. No acute changes since last appointment, and no other complaints at this time.   Objective: AAO x3, NAD DP/PT pulses palpable bilaterally, CRT less than 3 seconds On the right foot some metatarsal 1 is a granular ulceration with a mild hyperkeratotic periwound. The wound does probe approximately 1.5 cm and has a circumference of 2 x 1.5 cm is. Denies any erythema, ascending synovitis. No drainage or pus expressed. No fluctuance or crepitus. The wound to the outside aspect of the right fifth toe appears almost healed. Hyperkeratotic lesion with central ulceration left hallux without any drainage or pus or any signs of infection otherwise. There is no swelling to the legs nerve is no crepitation or fluctuance/crepitus No open lesions or pre-ulcerative lesions.  No pain with calf compression, swelling, warmth, erythema  Assessment: Wound bilaterally  Plan: -All treatment options discussed with the patient including all alternatives, risks, complications.  -Wound was debrided today and irrigated on the right side. Continue visiting with a dry dressing changes for now. -ABIs performed in the office today. Left ABI 1.22 right 1.21 with good waveforms. -Continue clindamycin, Cipro -Continue surgical shoes. -Order ESR, CRP, CBC. -Follow-up 1 week or sooner if needed. -Monitor for any clinical signs or symptoms of infection and  directed to call the office immediately should any occur or go to the ER. -Patient encouraged to call the office with any questions, concerns, change in symptoms.   Celesta Gentile, DPM

## 2017-06-16 NOTE — Telephone Encounter (Addendum)
-----   Message from Trula Slade, DPM sent at 06/15/2017  4:02 PM EDT ----- Labs are improving compared to 1 month ago! 06/16/2017-Informed pt of Dr. Leigh Aurora review of results. Pt states he has pain in the right leg going up his calf, and the left foot is swelling.06/17/2017- Venous duplex Orders faxed to [facility]. Left message informing pt he would be getting a call from [facility] for repeat venous duplex, and a call from our office to schedule to see Dr.Wagoner

## 2017-06-16 NOTE — Telephone Encounter (Signed)
Please schedule a venous duplex and please move up his appointment to when I asked

## 2017-06-17 NOTE — Telephone Encounter (Signed)
Pt has been scheduled to come in on Monday 23 July at 1:15 pm to see Dr. Jacqualyn Posey.

## 2017-06-20 ENCOUNTER — Encounter: Payer: Self-pay | Admitting: Podiatry

## 2017-06-20 ENCOUNTER — Ambulatory Visit (INDEPENDENT_AMBULATORY_CARE_PROVIDER_SITE_OTHER): Payer: Medicare PPO | Admitting: Podiatry

## 2017-06-20 ENCOUNTER — Telehealth: Payer: Self-pay | Admitting: Podiatry

## 2017-06-20 DIAGNOSIS — L97512 Non-pressure chronic ulcer of other part of right foot with fat layer exposed: Secondary | ICD-10-CM

## 2017-06-20 MED ORDER — LEVOFLOXACIN 500 MG PO TABS: 500.0000 mg | ORAL_TABLET | Freq: Every day | ORAL | 0 refills | Status: DC

## 2017-06-20 MED ORDER — HYDROCODONE-ACETAMINOPHEN 10-325 MG PO TABS: 1.0000 | ORAL_TABLET | Freq: Four times a day (QID) | ORAL | 0 refills | Status: DC | PRN

## 2017-06-20 NOTE — Telephone Encounter (Signed)
I informed [facility] that Dr. Jacqualyn Posey was discontinuing the last orders sent to [facility] for pt.

## 2017-06-20 NOTE — Telephone Encounter (Signed)
Abby with [facility] called with a question about order faxed today but dated the 14 th. Requested a call back at [phone].

## 2017-06-20 NOTE — Progress Notes (Signed)
Subjective: Mr. Rivkin presents the office they for follow-up evaluation of bilateral foot wounds. He states that he still gets some drainage coming from the left foot wound on the right submetatarsal 1. He continues to pack the wound with saline wet-to-dry. She is some mild increase in pain to the area. Denies any pus and denies any red streaks or any swelling redness. The pain to the right fifth toe is getting better. Also he is a wound in left great toe which is been chronic and Silvadene better. Denies any drainage or pus from the left foot. Has no other concerns. Denies any systemic complaints such as fevers, chills, nausea, vomiting. No acute changes since last appointment, and no other complaints at this time.   Objective: AAO x3, NAD DP/PT pulses palpable bilaterally, CRT less than 3 seconds On the right foot some metatarsal 1 is a granular ulceration with a mild hyperkeratotic periwound. The wound does probe approximately 1.5 cm and has a circumference of 2 x 1.5 cm is about the same compared to last appointment. There is no surrounding erythema, ascending cellulitis. There is tenderness palpation along the wound. There is mild swelling along the wound. Upon evaluation of wound there is no purulence expressible is on the [place] was drainage. There is no questions or crepitus there is no malodor. Superficial abrasion type wound of the right dorsal lateral fifth digit without any signs of infection. Is no edema, erythema. Mild tenderness. Chronic dry skin present the left hallux as well as hyperkeratotic tissue. Upon debrided superficial wound present on the medial aspect. No drainage or pus. No swelling erythema, ascending cellulitis. No fluctuance, crepitation, malodor. No open lesions or pre-ulcerative lesions.  No pain with calf compression, swelling, warmth, erythema        Assessment: Wound bilaterally  Plan: -All treatment options discussed with the patient including all  alternatives, risks, complications.  -At this point given his increase in pain although mild to the right side will then switch his antibiotics. Will switch to Levaquin. -Sharply debrided the wounds 2 without any competitions or bleeding. Continue with saline wet-to-dry packing on the right foot wound. Silvadene and the left side. -At this point in longevity of the wounds are recommended a wound care referral. A referral was placed today. -Reviewed blood work with him. Labs are improved. -Continue surgical shoe. -Follow-up in 10 days or sooner if needed. -Monitor for any clinical signs or symptoms of infection and directed to call the office immediately should any occur or go to the ER. -Patient encouraged to call the office with any questions, concerns, change in symptoms.   Celesta Gentile, DPM

## 2017-06-21 ENCOUNTER — Ambulatory Visit (HOSPITAL_COMMUNITY)
Admission: RE | Admit: 2017-06-21 | Discharge: 2017-06-21 | Disposition: A | Discharge status: Home / Self Care | Payer: Medicare PPO | Source: Ambulatory Visit | Attending: Vascular Surgery | Admitting: Vascular Surgery

## 2017-06-21 DIAGNOSIS — R52 Pain, unspecified: Secondary | ICD-10-CM

## 2017-06-21 DIAGNOSIS — R609 Edema, unspecified: Secondary | ICD-10-CM | POA: Diagnosis not present

## 2017-06-21 NOTE — Telephone Encounter (Signed)
Thank you :)

## 2017-06-27 ENCOUNTER — Ambulatory Visit: Payer: Medicare PPO | Admitting: Podiatry

## 2017-07-01 ENCOUNTER — Encounter: Payer: Self-pay | Admitting: Podiatry

## 2017-07-01 ENCOUNTER — Telehealth: Payer: Self-pay

## 2017-07-01 ENCOUNTER — Ambulatory Visit (INDEPENDENT_AMBULATORY_CARE_PROVIDER_SITE_OTHER): Payer: Medicare PPO | Admitting: Podiatry

## 2017-07-01 VITALS — BP 137/69 | HR 84 | Temp 98.7°F | Resp 18

## 2017-07-01 DIAGNOSIS — L97521 Non-pressure chronic ulcer of other part of left foot limited to breakdown of skin: Secondary | ICD-10-CM

## 2017-07-01 DIAGNOSIS — L97512 Non-pressure chronic ulcer of other part of right foot with fat layer exposed: Secondary | ICD-10-CM

## 2017-07-01 MED ORDER — HYDROCODONE-ACETAMINOPHEN 10-325 MG PO TABS: 1.0000 | ORAL_TABLET | Freq: Four times a day (QID) | ORAL | 0 refills | Status: DC | PRN

## 2017-07-01 NOTE — Telephone Encounter (Signed)
LVM for Referral coordinator to call us back in regards to setting up an appt for evaluation for pain management.

## 2017-07-04 ENCOUNTER — Telehealth: Payer: Self-pay | Admitting: *Deleted

## 2017-07-04 DIAGNOSIS — M86671 Other chronic osteomyelitis, right ankle and foot: Secondary | ICD-10-CM

## 2017-07-04 NOTE — Telephone Encounter (Addendum)
-----   Message from Trula Slade, DPM sent at 07/01/2017  8:54 AM EDT ----- Regarding: pain management  Can we follow up with pain management to see if they can schedule an appointment He states he has been calling. If not please put a referral in for [facility]. 07/04/2017-Left message informing pt he had been referred to [facility].

## 2017-07-06 ENCOUNTER — Encounter (HOSPITAL_BASED_OUTPATIENT_CLINIC_OR_DEPARTMENT_OTHER): Payer: Medicare PPO | Attending: Internal Medicine

## 2017-07-06 DIAGNOSIS — G603 Idiopathic progressive neuropathy: Secondary | ICD-10-CM | POA: Insufficient documentation

## 2017-07-06 DIAGNOSIS — L97522 Non-pressure chronic ulcer of other part of left foot with fat layer exposed: Secondary | ICD-10-CM | POA: Diagnosis not present

## 2017-07-06 DIAGNOSIS — L97516 Non-pressure chronic ulcer of other part of right foot with bone involvement without evidence of necrosis: Secondary | ICD-10-CM | POA: Diagnosis not present

## 2017-07-06 DIAGNOSIS — I10 Essential (primary) hypertension: Secondary | ICD-10-CM | POA: Insufficient documentation

## 2017-07-06 DIAGNOSIS — L97512 Non-pressure chronic ulcer of other part of right foot with fat layer exposed: Secondary | ICD-10-CM | POA: Insufficient documentation

## 2017-07-06 DIAGNOSIS — I890 Lymphedema, not elsewhere classified: Secondary | ICD-10-CM | POA: Diagnosis not present

## 2017-07-06 DIAGNOSIS — F17210 Nicotine dependence, cigarettes, uncomplicated: Secondary | ICD-10-CM | POA: Insufficient documentation

## 2017-07-06 DIAGNOSIS — J449 Chronic obstructive pulmonary disease, unspecified: Secondary | ICD-10-CM | POA: Diagnosis not present

## 2017-07-06 DIAGNOSIS — L84 Corns and callosities: Secondary | ICD-10-CM | POA: Diagnosis not present

## 2017-07-07 ENCOUNTER — Other Ambulatory Visit (HOSPITAL_COMMUNITY): Payer: Self-pay | Admitting: Internal Medicine

## 2017-07-07 ENCOUNTER — Ambulatory Visit (INDEPENDENT_AMBULATORY_CARE_PROVIDER_SITE_OTHER)
Admission: RE | Admit: 2017-07-07 | Discharge: 2017-07-07 | Disposition: A | Discharge status: Home / Self Care | Payer: Medicare PPO | Source: Ambulatory Visit | Attending: Vascular Surgery | Admitting: Vascular Surgery

## 2017-07-07 ENCOUNTER — Ambulatory Visit (HOSPITAL_COMMUNITY)
Admission: RE | Admit: 2017-07-07 | Discharge: 2017-07-07 | Disposition: A | Discharge status: Home / Self Care | Payer: Medicare PPO | Source: Ambulatory Visit | Attending: Vascular Surgery | Admitting: Vascular Surgery

## 2017-07-07 DIAGNOSIS — F17200 Nicotine dependence, unspecified, uncomplicated: Secondary | ICD-10-CM | POA: Diagnosis not present

## 2017-07-07 DIAGNOSIS — L97509 Non-pressure chronic ulcer of other part of unspecified foot with unspecified severity: Secondary | ICD-10-CM

## 2017-07-07 DIAGNOSIS — Z23 Encounter for immunization: Secondary | ICD-10-CM | POA: Diagnosis not present

## 2017-07-07 DIAGNOSIS — Z6841 Body Mass Index (BMI) 40.0 and over, adult: Secondary | ICD-10-CM | POA: Diagnosis not present

## 2017-07-07 DIAGNOSIS — I70235 Atherosclerosis of native arteries of right leg with ulceration of other part of foot: Secondary | ICD-10-CM | POA: Diagnosis not present

## 2017-07-07 DIAGNOSIS — R600 Localized edema: Secondary | ICD-10-CM | POA: Diagnosis not present

## 2017-07-07 DIAGNOSIS — L97909 Non-pressure chronic ulcer of unspecified part of unspecified lower leg with unspecified severity: Secondary | ICD-10-CM | POA: Diagnosis not present

## 2017-07-07 DIAGNOSIS — L97519 Non-pressure chronic ulcer of other part of right foot with unspecified severity: Secondary | ICD-10-CM

## 2017-07-07 DIAGNOSIS — L97529 Non-pressure chronic ulcer of other part of left foot with unspecified severity: Secondary | ICD-10-CM

## 2017-07-07 DIAGNOSIS — J449 Chronic obstructive pulmonary disease, unspecified: Secondary | ICD-10-CM | POA: Diagnosis not present

## 2017-07-07 DIAGNOSIS — Z1389 Encounter for screening for other disorder: Secondary | ICD-10-CM | POA: Diagnosis not present

## 2017-07-07 DIAGNOSIS — G6289 Other specified polyneuropathies: Secondary | ICD-10-CM | POA: Diagnosis not present

## 2017-07-07 DIAGNOSIS — R7309 Other abnormal glucose: Secondary | ICD-10-CM | POA: Diagnosis not present

## 2017-07-07 LAB — VAS US LOWER EXTREMITY ARTERIAL DUPLEX
Left ant tibial distal sys: 93 cm/s
Left super femoral dist sys PSV: -83 cm/s
Left super femoral mid sys PSV: -79 cm/s
Left super femoral prox sys PSV: 91 cm/s
RIGHT ANT DIST TIBAL SYS PSV: 59 cm/s
RIGHT POST TIB DIST SYS: 125 cm/s
Right super femoral dist sys PSV: -69 cm/s
Right super femoral mid sys PSV: -99 cm/s
Right super femoral prox sys PSV: 118 cm/s
left post tibial dist sys: 73 cm/s

## 2017-07-08 ENCOUNTER — Other Ambulatory Visit (HOSPITAL_COMMUNITY): Payer: Self-pay | Admitting: Internal Medicine

## 2017-07-08 ENCOUNTER — Encounter: Payer: Self-pay | Admitting: Podiatry

## 2017-07-08 ENCOUNTER — Ambulatory Visit (INDEPENDENT_AMBULATORY_CARE_PROVIDER_SITE_OTHER): Payer: Medicare PPO | Admitting: Podiatry

## 2017-07-08 DIAGNOSIS — L97512 Non-pressure chronic ulcer of other part of right foot with fat layer exposed: Secondary | ICD-10-CM

## 2017-07-08 DIAGNOSIS — R600 Localized edema: Secondary | ICD-10-CM

## 2017-07-08 DIAGNOSIS — L97521 Non-pressure chronic ulcer of other part of left foot limited to breakdown of skin: Secondary | ICD-10-CM

## 2017-07-08 MED ORDER — AMOXICILLIN-POT CLAVULANATE 875-125 MG PO TABS: 1.0000 | ORAL_TABLET | Freq: Two times a day (BID) | ORAL | 0 refills | Status: DC

## 2017-07-08 MED ORDER — HYDROCODONE-ACETAMINOPHEN 10-325 MG PO TABS: 1.0000 | ORAL_TABLET | Freq: Four times a day (QID) | ORAL | 0 refills | Status: DC | PRN

## 2017-07-08 NOTE — Telephone Encounter (Addendum)
-----   Message from Trula Slade, DPM sent at 07/08/2017  3:14 PM EDT ----- Can you please order an MRI of the right foot to rule out abscess/osteomyelitis- submet 1 ulcer. Orders faxed to [facility] [employer] and to [facility].

## 2017-07-08 NOTE — Progress Notes (Signed)
Subjective: [name] presents the office they for follow-up evaluation of bilateral foot wounds. He states of the wounds in the lithotomy doing well however the wound to the bottom of the right first MTPJ still painful. He denies any pus coming from. Denies any significant redness or any red streaks around the area. The pain that he was getting to the leg previously has resolved. He still gets the pain of the fifth toe but denies any drainage or pus or any swelling to this area. His continue with daily dressing changes. He states that he is no longer able to get packing into the wound on the right side sebaceous been protective dressing over the top of the foot over the last couple of days. Denies any systemic complaints such as fevers, chills, nausea, vomiting. No acute changes since last appointment, and no other complaints at this time.   Objective: AAO x3, NAD DP/PT pulses palpable bilaterally, CRT less than 3 seconds On the right foot some metatarsal 1 is a granular ulceration with a mild hyperkeratotic periwound. The wound does probe approximately 1.5 cm and has a circumference of 2 x 1.5 cm and it remained about the same. Small amount of clear drainage expressed there is no pus. There is tenderness directly to this area but there is no surrounding erythema, ascending synovitis. There is no fluctuance or crepitus there is no malodor. Right foot lateral fifth digit hyperkeratotic lesion. Upon debridement wound appears to be healed today. No clinical signs of infection present. Thick hyperkeratotic tissue to left hallux. Small superficial abrasion type lesion is present without any probing, undermining or tunneling. There is no swelling erythema, ascending synovitis. Hyperkeratotic tissue the distal aspect of the toe which was debrided today.         Assessment: Chronic wounds bilaterally  Plan: -All treatment options discussed with the patient including all alternatives, risks,  complications.  -I shop and debrided the wounds today without any complications. The wound on the right foot continues to provided pack the wound. Recommend continue with this really. Declined home health care. -At this point-recommended wound care evaluation. He has not been appointment with the wound care center. This is a chronic wound at this point and has not changed much I think he is at a second opinion. -Long-term he is going to need a custom shoe and insert. I would have him come back to see Liliane Channel for this. -Continue surgical shoe for now. -RTC next Thursday -Monitor for any clinical signs or symptoms of infection and directed to call the office immediately should any occur or go to the ER.  Celesta Gentile, DPM

## 2017-07-11 ENCOUNTER — Ambulatory Visit (HOSPITAL_COMMUNITY)
Admission: RE | Admit: 2017-07-11 | Discharge: 2017-07-11 | Disposition: A | Discharge status: Home / Self Care | Payer: Medicare PPO | Source: Ambulatory Visit | Attending: Surgery | Admitting: Surgery

## 2017-07-11 DIAGNOSIS — R600 Localized edema: Secondary | ICD-10-CM | POA: Diagnosis not present

## 2017-07-11 DIAGNOSIS — I872 Venous insufficiency (chronic) (peripheral): Secondary | ICD-10-CM | POA: Insufficient documentation

## 2017-07-12 ENCOUNTER — Telehealth: Payer: Self-pay | Admitting: Podiatry

## 2017-07-12 ENCOUNTER — Other Ambulatory Visit: Payer: Self-pay | Admitting: Internal Medicine

## 2017-07-12 ENCOUNTER — Other Ambulatory Visit: Payer: Self-pay

## 2017-07-12 DIAGNOSIS — R591 Generalized enlarged lymph nodes: Secondary | ICD-10-CM

## 2017-07-12 DIAGNOSIS — R52 Pain, unspecified: Secondary | ICD-10-CM

## 2017-07-12 DIAGNOSIS — R599 Enlarged lymph nodes, unspecified: Secondary | ICD-10-CM

## 2017-07-12 DIAGNOSIS — R609 Edema, unspecified: Secondary | ICD-10-CM

## 2017-07-12 NOTE — Telephone Encounter (Signed)
Pt called stating that his PCP Dr. Ardeth Perfect is wanting an MRI of his left foot done. I was calling to see if Dr. Jacqualyn Posey could change the order for a bilateral MRI. If not, I will call Dr. Ardeth Perfect back and let him know he will have to order the MRI of my left foot.

## 2017-07-12 NOTE — Telephone Encounter (Signed)
Yes, please do bilateral MRI

## 2017-07-12 NOTE — Telephone Encounter (Signed)
Dr Jacqualyn Posey, are you ok with adding an MRI of his left foot, Dr Ardeth Perfect states that he swollen lymph nodes in his groin, or would you like for me to have [facility] do it based on his assessment

## 2017-07-13 DIAGNOSIS — L97516 Non-pressure chronic ulcer of other part of right foot with bone involvement without evidence of necrosis: Secondary | ICD-10-CM | POA: Diagnosis not present

## 2017-07-13 DIAGNOSIS — L97512 Non-pressure chronic ulcer of other part of right foot with fat layer exposed: Secondary | ICD-10-CM | POA: Diagnosis not present

## 2017-07-13 DIAGNOSIS — L84 Corns and callosities: Secondary | ICD-10-CM | POA: Diagnosis not present

## 2017-07-13 DIAGNOSIS — Z716 Tobacco abuse counseling: Secondary | ICD-10-CM | POA: Diagnosis not present

## 2017-07-13 DIAGNOSIS — J449 Chronic obstructive pulmonary disease, unspecified: Secondary | ICD-10-CM | POA: Diagnosis not present

## 2017-07-13 DIAGNOSIS — G603 Idiopathic progressive neuropathy: Secondary | ICD-10-CM | POA: Diagnosis not present

## 2017-07-13 DIAGNOSIS — I890 Lymphedema, not elsewhere classified: Secondary | ICD-10-CM | POA: Diagnosis not present

## 2017-07-13 DIAGNOSIS — I10 Essential (primary) hypertension: Secondary | ICD-10-CM | POA: Diagnosis not present

## 2017-07-13 DIAGNOSIS — F17200 Nicotine dependence, unspecified, uncomplicated: Secondary | ICD-10-CM | POA: Diagnosis not present

## 2017-07-13 DIAGNOSIS — L97522 Non-pressure chronic ulcer of other part of left foot with fat layer exposed: Secondary | ICD-10-CM | POA: Diagnosis not present

## 2017-07-13 DIAGNOSIS — F17210 Nicotine dependence, cigarettes, uncomplicated: Secondary | ICD-10-CM | POA: Diagnosis not present

## 2017-07-13 DIAGNOSIS — Z87891 Personal history of nicotine dependence: Secondary | ICD-10-CM | POA: Diagnosis not present

## 2017-07-13 NOTE — Progress Notes (Signed)
Subjective: Mr. Housholder presents the office for follow-up evaluation of wounds to both of his feet. Patient states he is not concerned about the left big toe has been the same for several years and has not changed other than the wound has been improving and has no pain. His main concern is the wound to the right foot submetatarsal which does continue. He is eager to the wound care center and he states that Dr. Con Memos was able to probe to bone, although I have not been able to. He also states that Dr. Con Memos was able to get pus out of the foot on the right side. He has noticed some increase in swelling to his right foot but denies any increase in redness or warmth. He states he is scheduled for vascular studies coming out. He did have x-rays taken of the right foot which he brought in today. Denies any systemic complaints such as fevers, chills, nausea, vomiting. No acute changes since last appointment, and no other complaints at this time.   Objective: AAO x3, NAD DP/PT pulses palpable bilaterally, CRT less than 3 seconds On the plantar aspect of the right foot continues to be ulceration submetatarsal one with a granular wound base and hyperkeratotic periwound. The wound does track proximally sitting close to bone but I am unable to palpate bone today. Serous and was changes expressed but minimal to identify any purulence. There does be some mild increase in swelling to the right foot there is no erythema or increase in warmth. There is no area fluxions or crepitus and there is no malodor. The keratotic lesion of right fifth toe. Wound appears to be healed. Thick hyperkeratotic tissue continues left hallux and the superficial wound present. There is no drainage or pus expressed. This no areas of fluxions or crepitus. There is no malodor. No open lesions or pre-ulcerative lesions.  No pain with calf compression, swelling, warmth, erythema        Assessment: Chronic ulcerations  bilaterally.  Plan: -All treatment options discussed with the patient including all alternatives, risks, complications.  -At this point the long discussion regarding treatment options. He is frustrated now that he has several physicians involved in his care. I discussed with him the need to have a team approach in order to get these wounds to heal. These have been very difficult and I think that we need to have other opinions in regards to the wounds. -This point is going to re-MRI the right foot toe without any osteomy arise or abscess. Patient agrees to this and this is been ordered today. (His primary care physician to call requested MRI of the left foot as well as in order this.) -Continue with daily dressing changes after developing wound care center. -Scheduled for more vascular studies. -I had previous a biopsy of the left foot this is been quite some time ago. Discussed with him biopsy of the skin to any underlying pathology however he wishes to hold off on that today. In the future we should consider doing this. -Augmentin -Monitor for any clinical signs or symptoms of infection and directed to call the office immediately should any occur or go to the ER. -RTC 1 week  Celesta Gentile, DPM

## 2017-07-15 ENCOUNTER — Ambulatory Visit
Admission: RE | Admit: 2017-07-15 | Discharge: 2017-07-15 | Disposition: A | Discharge status: Home / Self Care | Payer: Medicare PPO | Source: Ambulatory Visit | Attending: Internal Medicine | Admitting: Internal Medicine

## 2017-07-15 ENCOUNTER — Telehealth: Payer: Self-pay | Admitting: *Deleted

## 2017-07-15 DIAGNOSIS — K760 Fatty (change of) liver, not elsewhere classified: Secondary | ICD-10-CM | POA: Diagnosis not present

## 2017-07-15 DIAGNOSIS — N400 Enlarged prostate without lower urinary tract symptoms: Secondary | ICD-10-CM | POA: Diagnosis not present

## 2017-07-15 DIAGNOSIS — R591 Generalized enlarged lymph nodes: Secondary | ICD-10-CM

## 2017-07-15 MED ORDER — IOPAMIDOL (ISOVUE-300) INJECTION 61%
125.0000 mL | Freq: Once | INTRAVENOUS | Status: AC | PRN
  Administered 2017-07-15: 125 mL via INTRAVENOUS

## 2017-07-15 NOTE — Telephone Encounter (Signed)
"  Patient is scheduled for a MRI on 07/27/17.  It needs authorization from [facility]."  I will work on it."

## 2017-07-18 ENCOUNTER — Other Ambulatory Visit: Payer: Self-pay | Admitting: Internal Medicine

## 2017-07-18 DIAGNOSIS — N2889 Other specified disorders of kidney and ureter: Secondary | ICD-10-CM

## 2017-07-19 ENCOUNTER — Encounter: Payer: Self-pay | Admitting: Vascular Surgery

## 2017-07-19 DIAGNOSIS — Z6841 Body Mass Index (BMI) 40.0 and over, adult: Secondary | ICD-10-CM | POA: Diagnosis not present

## 2017-07-19 DIAGNOSIS — K632 Fistula of intestine: Secondary | ICD-10-CM | POA: Diagnosis not present

## 2017-07-19 DIAGNOSIS — N2889 Other specified disorders of kidney and ureter: Secondary | ICD-10-CM | POA: Diagnosis not present

## 2017-07-19 DIAGNOSIS — L97909 Non-pressure chronic ulcer of unspecified part of unspecified lower leg with unspecified severity: Secondary | ICD-10-CM | POA: Diagnosis not present

## 2017-07-19 DIAGNOSIS — N390 Urinary tract infection, site not specified: Secondary | ICD-10-CM | POA: Diagnosis not present

## 2017-07-20 ENCOUNTER — Encounter: Payer: Self-pay | Admitting: Vascular Surgery

## 2017-07-20 ENCOUNTER — Encounter: Payer: Medicare PPO | Admitting: Vascular Surgery

## 2017-07-20 ENCOUNTER — Ambulatory Visit (INDEPENDENT_AMBULATORY_CARE_PROVIDER_SITE_OTHER): Payer: Medicare PPO | Admitting: Vascular Surgery

## 2017-07-20 VITALS — BP 149/82 | HR 73 | Temp 98.2°F | Resp 20 | Ht 79.0 in | Wt 393.0 lb

## 2017-07-20 DIAGNOSIS — L97523 Non-pressure chronic ulcer of other part of left foot with necrosis of muscle: Secondary | ICD-10-CM | POA: Diagnosis not present

## 2017-07-20 DIAGNOSIS — I872 Venous insufficiency (chronic) (peripheral): Secondary | ICD-10-CM

## 2017-07-20 DIAGNOSIS — M5417 Radiculopathy, lumbosacral region: Secondary | ICD-10-CM | POA: Diagnosis not present

## 2017-07-20 DIAGNOSIS — I83893 Varicose veins of bilateral lower extremities with other complications: Secondary | ICD-10-CM | POA: Diagnosis not present

## 2017-07-20 NOTE — Progress Notes (Signed)
Requested by:  [name], [address], [place] [address]  Reason for consultation: B toe wounds    History of Present Illness   [name] is a 61 y.o. (09/30/1956) male longstanding B radicular sx who presents with cc: toe ulcers.  This has a long standing history of neuropathic and radicular sx in both legs: burning in both feet with racing pain up right leg.  Over the last few years, he has had ulcers in this toes on both feet that have required repeated debridements, result in amputations of toes in R foot.  The patient notes chronic swelling in both feet and legs.  He denies any prior history of DVT and is uncertain of any family history of venous disorders.  He denies prior use of compression stockings.  Currently he is under wound care with both the [place] [facility] and also with Podiatry.  This patient's atherosclerotic risks include: smoking, HLD, and hyperglycemia.  Past Medical History:  Diagnosis Date  . Hx of adenomatous colonic polyps 06/09/2015  . Hyperlipidemia    no mediciations  . Hypertension    has previously been on medication but then taken off  . Neuromuscular disorder (HCC)    neuropathy lower legs and feet  . Neuropathy    in both feet  . Pneumonia 2011    Past Surgical History:  Procedure Laterality Date  . AMPUTATION Bilateral 06/05/2013   Procedure: RIGHT GREAT TOE AMPUTATION/LEFT GREAT TOE DEBRIDEMENT;  Surgeon: Wylene Simmer, MD;  Location: [place];  Service: Orthopedics;  Laterality: Bilateral;  . MOUTH SURGERY    . TOE AMPUTATION Right 06/05/2013   RIGHT GREAT TOE PARTIAL AMPUTATION    . TOOTH EXTRACTION  2011   multiple teeth extracted     Social History   Social History  . Marital status: Divorced    Spouse name: N/A  . Number of children: N/A  . Years of education: N/A   Occupational History  . Not on file.   Social History Main Topics  . Smoking status: Current Some Day Smoker    Packs/day: 0.20    Years:  40.00    Types: Cigarettes    Start date: 11/29/1973  . Smokeless tobacco: Never Used     Comment: occasional smoker - ~1 pack/week. smoked ~1.5ppd until 5 yr ago  . Alcohol use No  . Drug use: No  . Sexual activity: Not on file   Other Topics Concern  . Not on file   Social History Narrative  . No narrative on file    Family History  Problem Relation Age of Onset  . Heart disease Mother   . Cancer Father   . Diabetes Father   . Pancreatic cancer Father   . Cancer Sister   . Colon cancer Neg Hx   . Rectal cancer Neg Hx   . Stomach cancer Neg Hx   . Colon polyps Neg Hx     Current Outpatient Prescriptions  Medication Sig Dispense Refill  . amoxicillin-clavulanate (AUGMENTIN) 875-125 MG tablet Take 1 tablet by mouth 2 (two) times daily. 20 tablet 0  . atorvastatin (LIPITOR) 10 MG tablet Take 10 mg by mouth at bedtime.  5  . CHANTIX CONTINUING MONTH PAK 1 MG tablet TAKE AS PER PACKAGE DIRECTIONS  1  . CHANTIX STARTING MONTH PAK 0.5 MG X 11 & 1 MG X 42 tablet TAKE AS PRESCRIBED  0  . ciprofloxacin (CIPRO) 500 MG tablet Take 1 tablet (  500 mg total) by mouth 2 (two) times daily. 20 tablet 0  . clindamycin (CLEOCIN) 300 MG capsule Take 1 capsule (300 mg total) by mouth 3 (three) times daily. 30 capsule 2  . collagenase (SANTYL) ointment Apply 1 application topically daily. 15 g 3  . fluorouracil (EFUDEX) 5 % cream Apply topically 2 (two) times daily. 40 g 0  . HYDROcodone-acetaminophen (NORCO) 10-325 MG tablet Take 1 tablet by mouth every 6 (six) hours as needed. 20 tablet 0  . levofloxacin (LEVAQUIN) 500 MG tablet Take 1 tablet (500 mg total) by mouth daily. 10 tablet 0  . Multiple Vitamin (MULTIVITAMIN) tablet Take 1 tablet by mouth daily.    . mupirocin cream (BACTROBAN) 2 % Apply 1 application topically 2 (two) times daily. 15 g 0  . oxyCODONE-acetaminophen (PERCOCET/ROXICET) 5-325 MG tablet Take 1-2 tablets by mouth every 4 (four) hours as needed for severe pain (Take 1-2  tablets by mouth every four to six hours as needed for pain).    . pregabalin (LYRICA) 150 MG capsule Take 1 capsule (150 mg total) by mouth 2 (two) times daily. 90 capsule 2  . PROAIR HFA 108 (90 Base) MCG/ACT inhaler     . silver sulfADIAZINE (SILVADENE) 1 % cream Apply 1 application topically daily. 50 g 0  . SYMBICORT 160-4.5 MCG/ACT inhaler     . Wound Dressings (CURASOL WOUND DRESSING) GEL Apply 1 application topically daily. 30 g 0  . Wound Dressings (MEDIHONEY WOUND/BURN DRESSING) GEL Apply to affected area daily. 1 Tube 3   Current Facility-Administered Medications  Medication Dose Route Frequency Provider Last Rate Last Dose  . lidocaine (PF) (XYLOCAINE) 1 % injection 0.3 mL  0.3 mL Other Once Magnus Sinning, MD      . methylPREDNISolone acetate (DEPO-MEDROL) injection 80 mg  80 mg Other Once Magnus Sinning, MD        No Known Allergies  REVIEW OF SYSTEMS (negative unless checked):   Cardiac:  []  Chest pain or chest pressure? []  Shortness of breath upon activity? []  Shortness of breath when lying flat? []  Irregular heart rhythm?  Vascular:  [x]  Pain in calf, thigh, or hip brought on by walking? [x]  Pain in feet at night that wakes you up from your sleep? []  Blood clot in your veins? [x]  Leg swelling?  Pulmonary:  []  Oxygen at home? []  Productive cough? []  Wheezing?  Neurologic:  []  Sudden weakness in arms or legs? [x]  Sudden numbness in arms or legs? []  Sudden onset of difficult speaking or slurred speech? []  Temporary loss of vision in one eye? [x]  Problems with dizziness?  Gastrointestinal:  []  Blood in stool? []  Vomited blood?  Genitourinary:  []  Burning when urinating? []  Blood in urine?  Psychiatric:  []  Major depression  Hematologic:  []  Bleeding problems? []  Problems with blood clotting?  Dermatologic:  [x]  Rashes or ulcers?  Constitutional:  []  Fever or chills?  Ear/Nose/Throat:  []  Change in hearing? []  Nose bleeds? []  Sore  throat?  Musculoskeletal:  []  Back pain? []  Joint pain? []  Muscle pain?   For VQI Use Only   PRE-ADM LIVING Home  AMB STATUS Ambulatory  CAD Sx None  PRIOR CHF None  STRESS TEST No    Physical Examination     Vitals:   07/20/17 1102  BP: (!) 149/82  Pulse: 73  Resp: 20  Temp: 98.2 F (36.8 C)  SpO2: 95%  Weight: (!) 393 lb (178.3 kg)  Height: 6\' 7"  (2.007 m)  Body mass index is 44.27 kg/m.  General Alert, O x 3, Obese, NAD  Head [place]/AT,    Ear/Nose/ Throat Hearing grossly intact, nares without erythema or drainage, oropharynx without Erythema or Exudate, Mallampati score: 3,   Eyes PERRLA, EOMI,    Neck Supple, mid-line trachea,    Pulmonary Sym exp, good B air movt, CTA B  Cardiac RRR, Nl S1, S2, no Murmurs, No rubs, No S3,S4  Vascular Vessel Right Left  Radial Palpable Palpable  Brachial Palpable Palpable  Carotid Palpable, No Bruit Palpable, No Bruit  Aorta Not palpable N/A  Femoral Palpable Palpable  Popliteal Not palpable Not palpable  PT Faintly palpable Faintly palpable  DP Palpable Palpable    Gastro- intestinal soft, non-distended, non-tender to palpation, No guarding or rebound, no HSM, no masses, no CVAT B, No palpable prominent aortic pulse,  large pannus  Musculo- skeletal M/S 5/5 throughout  , Extremities without ischemic changes except multiple R toe amputations and L great toe ischemia at proximal phalange with obvious tissue loss, Pitting edema present: B 3+, Varicosities present: B moderate size, Lipodermatosclerosis present: L >> R, R foot bandage in [place], see picture below, architectural changes in R foot suggested of Charcot foot  Neurologic Cranial nerves 2-12 intact , Pain and light touch intact in extremities except for decreased sensation in B feet, Motor exam as listed above  Psychiatric Judgement intact, Mood & affect appropriate for pt's clinical situation  Dermatologic See M/S exam for extremity exam, No rashes otherwise noted    Lymphatic  Palpable lymph nodes: None        Non-invasive Vascular Imaging      ABI (07/07/17)  R:   ABI: 1.05,   PT: mono  DP: bi  TBI:  No great toe  L:   ABI: 0.95,   PT: bi  DP: tri  TBI: not done due to wound  BLE Arterial Duplex (07/07/17)  R: bi-triphasic throughout  L: triphasic throughout  BLE Venous Insufficiency Duplex (07/07/17):   RLE:   no DVT and SVT,   + GSV reflux: 4.4-6.1 mm  + SSV reflux: 4.9-7.3 mm  + deep venous reflux: CFV, SFJ  LLE:  no DVT and SVT,   no GSV reflux,   + SSV reflux: 2.9-4.4 mm  + deep venous reflux: CFV, SFJ   Outside Studies/Documentation   3 pages of outside documents were reviewed including: outpatient wound clinic notes.   Medical Decision Making   [name] is a 61 y.o. male who presents with: minimal BLE PAD, BLE CVI (C4), radiculopathy vs neuropathy BLE   I have suspicions that the residual toes in R foot are not salvageable, along with L great toe.   There is no arterial intervention needed in this patient.    I would incorporate compression in this patient's wound care after any further amputations needed in this patient.  The patent may need venous intervention at some point, but his toe issues will take priority for now.  Based on the patient's vascular studies and examination, I have offered the patient: follow up in [facility] in 3 months.  I discussed in depth with the patient the nature of atherosclerosis, and emphasized the importance of maximal medical management including strict control of blood pressure, blood glucose, and lipid levels, obtaining regular exercise, antiplatelet agents, and cessation of smoking.   The patient is currently on a statin: Lipitor.  The patient is currently not on an anti-platelet. Patient will be started on  ASA 81 mg PO daily  The patient is aware that without maximal medical management the underlying atherosclerotic disease process will  progress, limiting the benefit of any interventions.  Thank you for allowing [name] to participate in this patient's care.   [name], MD, FACS [facility] Office: [phone] Pager: [phone]  07/20/2017, 12:00 PM

## 2017-07-20 NOTE — Telephone Encounter (Signed)
MRIs were authorized.

## 2017-07-21 ENCOUNTER — Ambulatory Visit
Admission: RE | Admit: 2017-07-21 | Discharge: 2017-07-21 | Disposition: A | Discharge status: Home / Self Care | Payer: Medicare PPO | Source: Ambulatory Visit | Attending: Podiatry | Admitting: Podiatry

## 2017-07-21 DIAGNOSIS — L97519 Non-pressure chronic ulcer of other part of right foot with unspecified severity: Secondary | ICD-10-CM | POA: Diagnosis not present

## 2017-07-21 MED ORDER — GADOBENATE DIMEGLUMINE 529 MG/ML IV SOLN
20.0000 mL | Freq: Once | INTRAVENOUS | Status: AC | PRN
  Administered 2017-07-21: 20 mL via INTRAVENOUS

## 2017-07-26 DIAGNOSIS — I10 Essential (primary) hypertension: Secondary | ICD-10-CM | POA: Diagnosis not present

## 2017-07-26 DIAGNOSIS — I890 Lymphedema, not elsewhere classified: Secondary | ICD-10-CM | POA: Diagnosis not present

## 2017-07-26 DIAGNOSIS — G603 Idiopathic progressive neuropathy: Secondary | ICD-10-CM | POA: Diagnosis not present

## 2017-07-26 DIAGNOSIS — L84 Corns and callosities: Secondary | ICD-10-CM | POA: Diagnosis not present

## 2017-07-26 DIAGNOSIS — F17210 Nicotine dependence, cigarettes, uncomplicated: Secondary | ICD-10-CM | POA: Diagnosis not present

## 2017-07-26 DIAGNOSIS — L97522 Non-pressure chronic ulcer of other part of left foot with fat layer exposed: Secondary | ICD-10-CM | POA: Diagnosis not present

## 2017-07-26 DIAGNOSIS — L97516 Non-pressure chronic ulcer of other part of right foot with bone involvement without evidence of necrosis: Secondary | ICD-10-CM | POA: Diagnosis not present

## 2017-07-26 DIAGNOSIS — L97512 Non-pressure chronic ulcer of other part of right foot with fat layer exposed: Secondary | ICD-10-CM | POA: Diagnosis not present

## 2017-07-26 DIAGNOSIS — J449 Chronic obstructive pulmonary disease, unspecified: Secondary | ICD-10-CM | POA: Diagnosis not present

## 2017-07-27 ENCOUNTER — Inpatient Hospital Stay: Admission: RE | Admit: 2017-07-27 | Payer: Medicare PPO | Source: Ambulatory Visit

## 2017-07-27 ENCOUNTER — Ambulatory Visit
Admission: RE | Admit: 2017-07-27 | Discharge: 2017-07-27 | Disposition: A | Discharge status: Home / Self Care | Payer: Medicare PPO | Source: Ambulatory Visit | Attending: Internal Medicine | Admitting: Internal Medicine

## 2017-07-27 DIAGNOSIS — N281 Cyst of kidney, acquired: Secondary | ICD-10-CM | POA: Diagnosis not present

## 2017-07-27 DIAGNOSIS — N2889 Other specified disorders of kidney and ureter: Secondary | ICD-10-CM

## 2017-07-27 MED ORDER — GADOBENATE DIMEGLUMINE 529 MG/ML IV SOLN
20.0000 mL | Freq: Once | INTRAVENOUS | Status: AC | PRN
  Administered 2017-07-27: 20 mL via INTRAVENOUS

## 2017-07-28 ENCOUNTER — Other Ambulatory Visit: Payer: Medicare PPO

## 2017-07-29 NOTE — Telephone Encounter (Addendum)
-----   Message from Trula Slade, DPM sent at 07/22/2017  4:57 PM EDT ----- Very small abscess, no signs of bone infection. That abscess could also be where the wound is open and is being packed.  Please let him know. 07/29/2017-Left message informing [name] of Dr. Leigh Aurora review of results.

## 2017-08-02 ENCOUNTER — Encounter (HOSPITAL_BASED_OUTPATIENT_CLINIC_OR_DEPARTMENT_OTHER): Payer: Medicare PPO | Attending: Surgery

## 2017-08-02 DIAGNOSIS — I10 Essential (primary) hypertension: Secondary | ICD-10-CM | POA: Insufficient documentation

## 2017-08-02 DIAGNOSIS — I87323 Chronic venous hypertension (idiopathic) with inflammation of bilateral lower extremity: Secondary | ICD-10-CM | POA: Insufficient documentation

## 2017-08-02 DIAGNOSIS — L97512 Non-pressure chronic ulcer of other part of right foot with fat layer exposed: Secondary | ICD-10-CM | POA: Insufficient documentation

## 2017-08-02 DIAGNOSIS — I890 Lymphedema, not elsewhere classified: Secondary | ICD-10-CM | POA: Insufficient documentation

## 2017-08-02 DIAGNOSIS — J449 Chronic obstructive pulmonary disease, unspecified: Secondary | ICD-10-CM | POA: Diagnosis not present

## 2017-08-02 DIAGNOSIS — L97516 Non-pressure chronic ulcer of other part of right foot with bone involvement without evidence of necrosis: Secondary | ICD-10-CM | POA: Diagnosis not present

## 2017-08-02 DIAGNOSIS — L84 Corns and callosities: Secondary | ICD-10-CM | POA: Insufficient documentation

## 2017-08-02 DIAGNOSIS — L97522 Non-pressure chronic ulcer of other part of left foot with fat layer exposed: Secondary | ICD-10-CM | POA: Insufficient documentation

## 2017-08-02 DIAGNOSIS — G603 Idiopathic progressive neuropathy: Secondary | ICD-10-CM | POA: Insufficient documentation

## 2017-08-02 DIAGNOSIS — F17210 Nicotine dependence, cigarettes, uncomplicated: Secondary | ICD-10-CM | POA: Diagnosis not present

## 2017-08-08 ENCOUNTER — Ambulatory Visit
Admission: RE | Admit: 2017-08-08 | Discharge: 2017-08-08 | Disposition: A | Discharge status: Home / Self Care | Payer: Medicare PPO | Source: Ambulatory Visit | Attending: Podiatry | Admitting: Podiatry

## 2017-08-08 DIAGNOSIS — S91302A Unspecified open wound, left foot, initial encounter: Secondary | ICD-10-CM | POA: Diagnosis not present

## 2017-08-08 DIAGNOSIS — R52 Pain, unspecified: Secondary | ICD-10-CM

## 2017-08-08 DIAGNOSIS — R599 Enlarged lymph nodes, unspecified: Secondary | ICD-10-CM

## 2017-08-08 DIAGNOSIS — R609 Edema, unspecified: Secondary | ICD-10-CM

## 2017-08-08 MED ORDER — GADOBENATE DIMEGLUMINE 529 MG/ML IV SOLN
20.0000 mL | Freq: Once | INTRAVENOUS | Status: AC | PRN
  Administered 2017-08-08: 20 mL via INTRAVENOUS

## 2017-08-09 DIAGNOSIS — J449 Chronic obstructive pulmonary disease, unspecified: Secondary | ICD-10-CM | POA: Diagnosis not present

## 2017-08-09 DIAGNOSIS — I87323 Chronic venous hypertension (idiopathic) with inflammation of bilateral lower extremity: Secondary | ICD-10-CM | POA: Diagnosis not present

## 2017-08-09 DIAGNOSIS — F17210 Nicotine dependence, cigarettes, uncomplicated: Secondary | ICD-10-CM | POA: Diagnosis not present

## 2017-08-09 DIAGNOSIS — D49511 Neoplasm of unspecified behavior of right kidney: Secondary | ICD-10-CM | POA: Diagnosis not present

## 2017-08-09 DIAGNOSIS — L84 Corns and callosities: Secondary | ICD-10-CM | POA: Diagnosis not present

## 2017-08-09 DIAGNOSIS — L97512 Non-pressure chronic ulcer of other part of right foot with fat layer exposed: Secondary | ICD-10-CM | POA: Diagnosis not present

## 2017-08-09 DIAGNOSIS — G603 Idiopathic progressive neuropathy: Secondary | ICD-10-CM | POA: Diagnosis not present

## 2017-08-09 DIAGNOSIS — I890 Lymphedema, not elsewhere classified: Secondary | ICD-10-CM | POA: Diagnosis not present

## 2017-08-09 DIAGNOSIS — D3001 Benign neoplasm of right kidney: Secondary | ICD-10-CM | POA: Diagnosis not present

## 2017-08-09 DIAGNOSIS — L97522 Non-pressure chronic ulcer of other part of left foot with fat layer exposed: Secondary | ICD-10-CM | POA: Diagnosis not present

## 2017-08-10 ENCOUNTER — Telehealth: Payer: Self-pay | Admitting: *Deleted

## 2017-08-10 NOTE — Telephone Encounter (Addendum)
-----   Message from Trula Slade, DPM sent at 08/09/2017  7:08 AM EDT ----- Negative for osteo on the left foot.08/10/2017-Left message informing pt of the MRI results, and that I left results because I knew it was difficult for him to get to the phone.

## 2017-08-11 ENCOUNTER — Telehealth: Payer: Self-pay | Admitting: *Deleted

## 2017-08-11 MED ORDER — PREGABALIN 150 MG PO CAPS: 150.0000 mg | ORAL_CAPSULE | Freq: Two times a day (BID) | ORAL | 3 refills | Status: DC

## 2017-08-11 NOTE — Telephone Encounter (Signed)
Received refill request for Lyrica 150mg  #90. Dr. Amalia Hailey refilled +3additional. Return faxed to [facility].

## 2017-08-15 DIAGNOSIS — N321 Vesicointestinal fistula: Secondary | ICD-10-CM | POA: Diagnosis not present

## 2017-08-16 ENCOUNTER — Telehealth: Payer: Self-pay

## 2017-08-16 DIAGNOSIS — I890 Lymphedema, not elsewhere classified: Secondary | ICD-10-CM | POA: Diagnosis not present

## 2017-08-16 DIAGNOSIS — J449 Chronic obstructive pulmonary disease, unspecified: Secondary | ICD-10-CM | POA: Diagnosis not present

## 2017-08-16 DIAGNOSIS — I87323 Chronic venous hypertension (idiopathic) with inflammation of bilateral lower extremity: Secondary | ICD-10-CM | POA: Diagnosis not present

## 2017-08-16 DIAGNOSIS — L84 Corns and callosities: Secondary | ICD-10-CM | POA: Diagnosis not present

## 2017-08-16 DIAGNOSIS — L97522 Non-pressure chronic ulcer of other part of left foot with fat layer exposed: Secondary | ICD-10-CM | POA: Diagnosis not present

## 2017-08-16 DIAGNOSIS — F17210 Nicotine dependence, cigarettes, uncomplicated: Secondary | ICD-10-CM | POA: Diagnosis not present

## 2017-08-16 DIAGNOSIS — G603 Idiopathic progressive neuropathy: Secondary | ICD-10-CM | POA: Diagnosis not present

## 2017-08-16 DIAGNOSIS — L97512 Non-pressure chronic ulcer of other part of right foot with fat layer exposed: Secondary | ICD-10-CM | POA: Diagnosis not present

## 2017-08-16 NOTE — Telephone Encounter (Signed)
Patient has been scheduled for 09/12/17 7:30 and pre-visit for 08/30/17 3:30

## 2017-08-16 NOTE — Telephone Encounter (Signed)
-----   Message from [name], MD sent at 08/15/2017  4:42 PM EDT ----- Regarding: RE: preop work up Faythe Ghee I will have my nurse contact him and set up a direct colonoscopy regarding colovesical fistula and history of colon polyps for October. ----- Message ----- From: Leighton Ruff, MD Sent: 6/75/4492   3:47 PM To: [name], MD Subject: RE: preop work up                              I think he'd be up for that.  Oct should be fine.  I don't think he sees Manny til next month anyway.  Elmo Putt ----- Message ----- From: [name], MD Sent: 08/15/2017   3:33 PM To: Leighton Ruff, MD Subject: RE: preop work up                              I think it might be best to go ahead and do a whole colonoscopy now if he is willing   We can call him and set that up without seeing me until colonoscopy - would be in October  Is that ok?  Glendell Docker ----- Message ----- From: Leighton Ruff, MD Sent: 0/08/711   9:21 AM To: [name], MD Subject: preop work up                                  Reed Pandy.  This pt has a new diagnosis of RCC and colovesical fistula by CT scan.  We are planning a combined surgery with Dr Tresa Moore.  It looks like his last scope with you was in 2016 and he is due again in 2019.  I was just wondering if you would like to do a flex sig now to look at the fistula site and repeat colonoscopy next year or if you'd want to go ahead and repeat everything now.  I can also do his flex sig for preop if you are busy.  We will get him an apt to discuss with you.  Thanks  Elmo Putt

## 2017-08-23 ENCOUNTER — Ambulatory Visit (INDEPENDENT_AMBULATORY_CARE_PROVIDER_SITE_OTHER): Payer: Medicare PPO | Admitting: Neurology

## 2017-08-23 ENCOUNTER — Telehealth: Payer: Self-pay | Admitting: Neurology

## 2017-08-23 ENCOUNTER — Encounter: Payer: Self-pay | Admitting: Neurology

## 2017-08-23 VITALS — BP 167/86 | HR 86 | Ht 79.5 in | Wt 394.0 lb

## 2017-08-23 DIAGNOSIS — R200 Anesthesia of skin: Secondary | ICD-10-CM

## 2017-08-23 DIAGNOSIS — E531 Pyridoxine deficiency: Secondary | ICD-10-CM

## 2017-08-23 DIAGNOSIS — E519 Thiamine deficiency, unspecified: Secondary | ICD-10-CM

## 2017-08-23 DIAGNOSIS — R270 Ataxia, unspecified: Secondary | ICD-10-CM | POA: Diagnosis not present

## 2017-08-23 DIAGNOSIS — E538 Deficiency of other specified B group vitamins: Secondary | ICD-10-CM | POA: Diagnosis not present

## 2017-08-23 DIAGNOSIS — G629 Polyneuropathy, unspecified: Secondary | ICD-10-CM | POA: Diagnosis not present

## 2017-08-23 DIAGNOSIS — R7309 Other abnormal glucose: Secondary | ICD-10-CM | POA: Diagnosis not present

## 2017-08-23 NOTE — Progress Notes (Signed)
[facility]    Provider:  Dr Jaynee Eagles Referring Provider: [name], MD Primary Care Physician:  [name], MD  CC:  Pain in feet  HPI:  [name]. is a 61 y.o. male here as a referral from Dr. Ardeth Perfect for peripheral neuropathy.   This has a long standing history of neuropathic and radicular sx in both legs: burning in both feet with racing pain up right leg.  Over the last few years, he has had ulcers in this toes on both feet that have required repeated debridements, result in amputations of toes in R foot. Currently he is under wound care with both the [place] [facility] and also with Podiatry.. He has a past medical history of  COPD, HTN, HLD, morbid obesity, previous amputation toe, multiple ulcerations to the bilateral feet with hx of cellulitis. Patient was referred to pain management. He was bit by a brown recluse spider in 2005 and that is when he noticed neuropathy, the sores on the toes, Started with sores on the toes within 6 months and ongoing since then, he has had amputation of the great right toe, in 2010 and 2011 noticing neuropathy in the feet, progressively worsening. He has numbness, burning, stinging, stabbing, constantly feels like he is walking on tennis balls. No weakness. Balance is impaired. Symptoms above the ankle. Hands are largely unaffected but digit 2 in each hand often gets cold and numb. He went for many years after the start of symptoms without medical, so labs available exceot starting a few years ago.   Reviewed notes, labs and imaging from outside physicians, which showed: Reviewed chart. Patient has chronic low back pain and has received lumbar facet joint intra-articular injections. He is seen by orthopedics with pain across the low back into the buttocks, the right worse than the left, worse with standing. MRI showed spondylosis without myelopathy or radiculopathy in the lumbar region. Patient has had wounds on the foot,  his foot has been swollen into his ankle, and noticed an odor to the ulcer. Previous amputation of the hallux. He has a history of cellulitis in the foot due to foot ulceration, abscess of the fifth toe with cellulitis. Patient has had multiple ulcerations to the bilateral feet in the past as well. Patient was referred to pain management.  Reviewed primary care notes last hemoglobin A1c 8.7, morbid obesity BMI of 44, he is a current every day smoker,  Reviewed labs which include normal ESR, unremarkable CBC, BMP with elevated glucose, LDL 138, TSH normal, hemoglobin A1c in May 5.6 however HgbA1c on referral states at some point was 8.7. Dated 07/08/2017 but patient denies having diabetes? crp elevated at 36.   MRI lumbar spine: Personally reviewed images and agree with the following  IMPRESSION: 1. No acute osseous abnormality. 2. Lumbar spondylosis with both discogenic and facet degenerative changes most pronounced at the L3-4 and L4-5 levels. 3. At L3-4 and L4-5 there is moderate right and mild left foraminal narrowing, lateral recess effacement, and mild canal stenosis.  MR foot 07/2017 reviewed report: Large appearing skin wound on the medial aspect of the great toe without underlying abscess or osteomyelitis.  First MTP osteoarthritis. Marked marrow edema in the medial sesamoid bone is likely related to osteoarthritis but could be due to sesamoiditis.  Review of Systems: Patient complains of symptoms per HPI as well as the following symptoms: neuropathy, imbalance. Pertinent negatives and positives per HPI. All others negative.   Social History  Social History  . Marital status: Divorced    Spouse name: N/A  . Number of children: N/A  . Years of education: N/A   Occupational History  . Not on file.   Social History Main Topics  . Smoking status: Current Some Day Smoker    Packs/day: 0.20    Years: 40.00    Types: Cigarettes    Start date: 11/29/1973  . Smokeless tobacco:  Never Used     Comment: occasional smoker - ~1 pack/week. smoked ~1.5ppd until 5 yr ago  . Alcohol use No  . Drug use: No  . Sexual activity: Not on file   Other Topics Concern  . Not on file   Social History Narrative  . No narrative on file    Family History  Problem Relation Age of Onset  . Heart disease Mother   . Cancer Father   . Diabetes Father   . Pancreatic cancer Father   . Cancer Sister   . Colon cancer Neg Hx   . Rectal cancer Neg Hx   . Stomach cancer Neg Hx   . Colon polyps Neg Hx     Past Medical History:  Diagnosis Date  . Hx of adenomatous colonic polyps 06/09/2015  . Hyperlipidemia    no mediciations  . Hypertension    has previously been on medication but then taken off  . Neuromuscular disorder (HCC)    neuropathy lower legs and feet  . Neuropathy    in both feet  . Pneumonia 2011    Past Surgical History:  Procedure Laterality Date  . AMPUTATION Bilateral 06/05/2013   Procedure: RIGHT GREAT TOE AMPUTATION/LEFT GREAT TOE DEBRIDEMENT;  Surgeon: Wylene Simmer, MD;  Location: [facility];  Service: Orthopedics;  Laterality: Bilateral;  . MOUTH SURGERY    . TOE AMPUTATION Right 06/05/2013   RIGHT GREAT TOE PARTIAL AMPUTATION    . TOOTH EXTRACTION  2011   multiple teeth extracted    Current Outpatient Prescriptions  Medication Sig Dispense Refill  . atorvastatin (LIPITOR) 10 MG tablet Take 10 mg by mouth at bedtime.  5  . CHANTIX CONTINUING MONTH PAK 1 MG tablet TAKE AS PER PACKAGE DIRECTIONS  1  . pregabalin (LYRICA) 150 MG capsule Take 1 capsule (150 mg total) by mouth 2 (two) times daily. 90 capsule 3  . PROAIR HFA 108 (90 Base) MCG/ACT inhaler     . SYMBICORT 160-4.5 MCG/ACT inhaler     . tamsulosin (FLOMAX) 0.4 MG CAPS capsule Take 0.4 mg by mouth daily.     Current Facility-Administered Medications  Medication Dose Route Frequency Provider Last Rate Last Dose  . lidocaine (PF) (XYLOCAINE) 1 % injection 0.3 mL  0.3 mL Other Once Magnus Sinning,  MD      . methylPREDNISolone acetate (DEPO-MEDROL) injection 80 mg  80 mg Other Once Magnus Sinning, MD        Allergies as of 08/23/2017  . (No Known Allergies)    Vitals: BP (!) 167/86   Pulse 86   Ht 6' 7.5" (2.019 m)   Wt (!) 394 lb (178.7 kg)   BMI 43.83 kg/m  Last Weight:  Wt Readings from Last 1 Encounters:  08/23/17 (!) 394 lb (178.7 kg)   Last Height:   Ht Readings from Last 1 Encounters:  08/23/17 6' 7.5" (2.019 m)   Physical exam: Exam: Gen: NAD, conversant, well nourised, morbidly obese, poorly groomed  CV: RRR, no MRG. No Carotid Bruits. +peripheral edema, warm, nontender Eyes: Conjunctivae clear without exudates or hemorrhage  Neuro: Detailed Neurologic Exam  Speech:    Speech is normal; fluent and spontaneous with normal comprehension.  Cognition:    The patient is oriented to person, place, and time;     recent and remote memory intact;     language fluent;     normal attention, concentration,     fund of knowledge Cranial Nerves:    The pupils are equal, round, and reactive to light.Attempted fundoscopic exam could not visualize due to small pupils. . Visual fields are full to finger confrontation. Extraocular movements are intact. Trigeminal sensation is intact and the muscles of mastication are normal. The face is symmetric. The palate elevates in the midline. Hearing intact. Voice is normal. Shoulder shrug is normal. The tongue has normal motion without fasciculations.   Coordination:    No dysmetria  Gait:    Uses a cane, antalgic, wide based likeely to to sensory ataxia as well as extremely large body habitus  Motor Observation:    no involuntary movements noted. Tone:    Normal muscle tone.    Posture:    erect    Strength:    Strength is V/V in the upper and lower limbs.      Sensation: decreased sensation to pinprick to below kness and wrists, absent to vibration to knees, absent proprioception left great toe       Reflex Exam:  DTR's: Absent AJs otherwise trace to 1+      Toes: Amputation of right great toe, other ulcerations of the feet.  The left toe are equiv.    Clonus:    Clonus is absent.       Assessment/Plan:   61 y.o. male here as a referral from Dr. Ardeth Perfect for peripheral neuropathy. He has a past medical history of  spider bite, previous amputation toes, multiple ulcerations to the bilateral feet with hx of cellulitis, [facility] more recently wound clinic. Marland Kitchen Recommend pain management (he has been referred), hemoglobin A1c in May 5.6 however HgbA1c on referral states at some point was 8.7(?), Dated 07/08/2017 but patient denies having diabetes? He has not been taking his aspirin, he says due to forgetfullness.  Severe Neuropathy can be due to atherosclerosis, chronic Venous insufficiency, peripheral artery disease  morbid obesity,  smoking, non compliance with medications. MRI of the lumbar spine doesn't show significant nerve root impingement doubt radiculopathy.   Discussed smoking cessation, weight loss, medication compliance especially with aspirin and close follow-up with primary care for tight control of vascular risk factors  We'll complete neuropathy panel for any other causes of patient's severe length dependent peripheral polyneuropathy.  Orders Placed This Encounter  Procedures  . Hemoglobin A1c  . Methylmalonic acid, serum  . Vitamin B1  . B. burgdorfi Antibody  . Angiotensin converting enzyme  . HIV antibody  . ANA w/Reflex  . Sjogren's syndrome antibods(ssa + ssb)  . Pan-ANCA  . B12 and Folate Panel  . RPR  . Hepatitis C antibody  . Heavy metals, blood  . Vitamin B6  . Vitamin E  . Multiple Myeloma Panel (SPEP&IFE w/QIG)  . Copper, serum   Cc: [name], MD    Sarina Ill, MD  [facility] [address] [place], [place] [address]  Phone [phone] Fax [phone]

## 2017-08-23 NOTE — Telephone Encounter (Signed)
Called the office of Dr Erma Heritage, left voice mail with his CME. Dr Jaynee Eagles would like a copy of the patients history of Hgb A1C. I have in the voice message requested a call back with this information or for them to fax this information to me at my fax.

## 2017-08-24 ENCOUNTER — Encounter: Payer: Self-pay | Admitting: Neurology

## 2017-08-25 ENCOUNTER — Telehealth: Payer: Self-pay | Admitting: Podiatry

## 2017-08-25 NOTE — Telephone Encounter (Signed)
OK to refill

## 2017-08-25 NOTE — Telephone Encounter (Signed)
Spoke with patient, he is currently going to the wound care center weekly for treatment for foot wounds. His LOV was 07/08/17. He is wanting a refill on his Lyrica. Please advise

## 2017-08-25 NOTE — Telephone Encounter (Signed)
I have a prescription for Lyrica from Dr. Jacqualyn Posey that has run out and I need to have it refilled. Let me know if this can be done. I can be reached at [phone]. Thank you.

## 2017-08-26 ENCOUNTER — Other Ambulatory Visit: Payer: Self-pay

## 2017-08-26 LAB — MULTIPLE MYELOMA PANEL, SERUM
Albumin SerPl Elph-Mcnc: 3.1 g/dL (ref 2.9–4.4)
Albumin/Glob SerPl: 0.8 (ref 0.7–1.7)
Alpha 1: 0.2 g/dL (ref 0.0–0.4)
Alpha2 Glob SerPl Elph-Mcnc: 1.1 g/dL — ABNORMAL HIGH (ref 0.4–1.0)
B-Globulin SerPl Elph-Mcnc: 1.3 g/dL (ref 0.7–1.3)
Gamma Glob SerPl Elph-Mcnc: 1.5 g/dL (ref 0.4–1.8)
Globulin, Total: 4.1 g/dL — ABNORMAL HIGH (ref 2.2–3.9)
IgA/Immunoglobulin A, Serum: 364 mg/dL (ref 90–386)
IgG (Immunoglobin G), Serum: 1391 mg/dL (ref 700–1600)
IgM (Immunoglobulin M), Srm: 112 mg/dL (ref 20–172)
Total Protein: 7.2 g/dL (ref 6.0–8.5)

## 2017-08-26 LAB — HIV ANTIBODY (ROUTINE TESTING W REFLEX): HIV Screen 4th Generation wRfx: NONREACTIVE

## 2017-08-26 LAB — SJOGREN'S SYNDROME ANTIBODS(SSA + SSB)
ENA SSA (RO) Ab: <0.2 AI (ref 0.0–0.9)
ENA SSB (LA) Ab: <0.2 AI (ref 0.0–0.9)

## 2017-08-26 LAB — HEAVY METALS, BLOOD
Arsenic: 5 ug/L (ref 2–23)
Lead, Blood: 1 ug/dL (ref 0–4)
Mercury: NOT DETECTED ug/L (ref 0.0–14.9)

## 2017-08-26 LAB — HEMOGLOBIN A1C
Est. average glucose Bld gHb Est-mCnc: 126 mg/dL
Hgb A1c MFr Bld: 6 % — ABNORMAL HIGH (ref 4.8–5.6)

## 2017-08-26 LAB — VITAMIN E
Vitamin E (Alpha Tocopherol): 8.2 mg/L — ABNORMAL LOW (ref 9.0–29.0)
Vitamin E(Gamma Tocopherol): 2.2 mg/L (ref 0.5–4.9)

## 2017-08-26 LAB — VITAMIN B1: Thiamine: 142.4 nmol/L (ref 66.5–200.0)

## 2017-08-26 LAB — METHYLMALONIC ACID, SERUM: Methylmalonic Acid: 540 nmol/L — ABNORMAL HIGH (ref 0–378)

## 2017-08-26 LAB — PAN-ANCA
ANCA Proteinase 3: <3.5 U/mL (ref 0.0–3.5)
Atypical pANCA: <1:20 {titer}
C-ANCA: <1:20 {titer}
Myeloperoxidase Ab: <9 U/mL (ref 0.0–9.0)
P-ANCA: <1:20 {titer}

## 2017-08-26 LAB — B. BURGDORFI ANTIBODIES: Lyme IgG/IgM Ab: <0.91 {ISR} (ref 0.00–0.90)

## 2017-08-26 LAB — ANA W/REFLEX: Anti Nuclear Antibody(ANA): NEGATIVE

## 2017-08-26 LAB — VITAMIN B6: Vitamin B6: 8.6 ug/L (ref 5.3–46.7)

## 2017-08-26 LAB — B12 AND FOLATE PANEL
Folate: <2 ng/mL — ABNORMAL LOW (ref >3.0)
Vitamin B-12: 184 pg/mL — ABNORMAL LOW (ref 232–1245)

## 2017-08-26 LAB — RPR: RPR Ser Ql: NONREACTIVE

## 2017-08-26 LAB — COPPER, SERUM: Copper: 151 ug/dL (ref 72–166)

## 2017-08-26 LAB — HEPATITIS C ANTIBODY: Hep C Virus Ab: <0.1 s/co ratio (ref 0.0–0.9)

## 2017-08-26 LAB — ANGIOTENSIN CONVERTING ENZYME: Angio Convert Enzyme: 60 U/L (ref 14–82)

## 2017-08-26 MED ORDER — PREGABALIN 150 MG PO CAPS: 150.0000 mg | ORAL_CAPSULE | Freq: Two times a day (BID) | ORAL | 3 refills | Status: DC

## 2017-08-30 ENCOUNTER — Ambulatory Visit (AMBULATORY_SURGERY_CENTER): Payer: Self-pay

## 2017-08-30 VITALS — Ht >= 80 in | Wt 389.4 lb

## 2017-08-30 DIAGNOSIS — N321 Vesicointestinal fistula: Secondary | ICD-10-CM

## 2017-08-30 NOTE — Progress Notes (Signed)
Pt came into the office for his PV prior to his colon with Dr Carlean Purl on 09/12/17. Due to a complicated medical history and weight of 389.4 lbs, the pt was scheduled for an office visit with Amy on 08/31/17.  Informed pt of our policy on wgt restrictions and due to his medical hx, he needs to be evaluated in the office.  The pt was upset the  colon on 09/12/17 was cx'd and he would need the colon scheduled at the hospital. Medical records were sent to the 3rd floor for review.

## 2017-08-31 ENCOUNTER — Encounter: Payer: Self-pay | Admitting: Physician Assistant

## 2017-08-31 ENCOUNTER — Ambulatory Visit (INDEPENDENT_AMBULATORY_CARE_PROVIDER_SITE_OTHER): Payer: Medicare PPO | Admitting: Physician Assistant

## 2017-08-31 ENCOUNTER — Encounter (HOSPITAL_BASED_OUTPATIENT_CLINIC_OR_DEPARTMENT_OTHER): Payer: Medicare PPO | Attending: Internal Medicine

## 2017-08-31 ENCOUNTER — Telehealth: Payer: Self-pay | Admitting: Neurology

## 2017-08-31 ENCOUNTER — Other Ambulatory Visit: Payer: Self-pay | Admitting: Neurology

## 2017-08-31 VITALS — BP 158/88 | HR 92 | Ht 79.5 in | Wt 388.0 lb

## 2017-08-31 DIAGNOSIS — Z86010 Personal history of colonic polyps: Secondary | ICD-10-CM | POA: Diagnosis not present

## 2017-08-31 DIAGNOSIS — L97512 Non-pressure chronic ulcer of other part of right foot with fat layer exposed: Secondary | ICD-10-CM | POA: Insufficient documentation

## 2017-08-31 DIAGNOSIS — L97529 Non-pressure chronic ulcer of other part of left foot with unspecified severity: Secondary | ICD-10-CM | POA: Diagnosis not present

## 2017-08-31 DIAGNOSIS — I87323 Chronic venous hypertension (idiopathic) with inflammation of bilateral lower extremity: Secondary | ICD-10-CM | POA: Diagnosis not present

## 2017-08-31 DIAGNOSIS — J449 Chronic obstructive pulmonary disease, unspecified: Secondary | ICD-10-CM | POA: Diagnosis not present

## 2017-08-31 DIAGNOSIS — I10 Essential (primary) hypertension: Secondary | ICD-10-CM | POA: Diagnosis not present

## 2017-08-31 DIAGNOSIS — N321 Vesicointestinal fistula: Secondary | ICD-10-CM

## 2017-08-31 DIAGNOSIS — L97521 Non-pressure chronic ulcer of other part of left foot limited to breakdown of skin: Secondary | ICD-10-CM | POA: Diagnosis not present

## 2017-08-31 DIAGNOSIS — G603 Idiopathic progressive neuropathy: Secondary | ICD-10-CM | POA: Diagnosis not present

## 2017-08-31 MED ORDER — PREGABALIN 150 MG PO CAPS: 150.0000 mg | ORAL_CAPSULE | Freq: Two times a day (BID) | ORAL | 4 refills | Status: DC

## 2017-08-31 NOTE — Telephone Encounter (Signed)
Called patient to discuss lab results. No answer at this time. LVM for pt to return call

## 2017-08-31 NOTE — Telephone Encounter (Signed)
Discussed low b12 and folate with patient. Will call in a prescription for him. This can cause neuropathy. He is also prediabetic which can cause neuropathy. His vitamin E is slightly low 8 mg/L however deficiency is not characteristic until less than 5 mg/L. Other causes for neuropathy in this particular patient include atherosclerosis, chronic Venous insufficiency, peripheral artery disease  morbid obesity,  smoking, non compliance with medications. Follow up with pcp

## 2017-08-31 NOTE — Patient Instructions (Signed)
You have been scheduled for a colonoscopy. Please follow written instructions given to you at your visit today.  Please pick up your prep supplies at the pharmacy within the next 1-3 days. If you use inhalers (even only as needed), please bring them with you on the day of your procedure. Your physician has requested that you go to www.startemmi.com and enter the access code given to you at your visit today. This web site gives a general overview about your procedure. However, you should still follow specific instructions given to you by our office regarding your preparation for the procedure.   If you are age 61 or younger, your body mass index should be between 19-25. Your Body mass index is 43.16 kg/m. If this is out of the aformentioned range listed, please consider follow up with your Primary Care Provider.

## 2017-08-31 NOTE — Progress Notes (Addendum)
Subjective:    Patient ID: Cory Spates., male    DOB: March 03, 1956, 61 y.o.   MRN: [id number]  HPI [name] is a pleasant 61 year old white male, known to Dr. Carlean Purl from prior procedures. He comes in today to discuss preop colonoscopy. Patient had undergone CT of the abdomen and pelvis on 07/15/2017 after he was noted to have some right inguinal lymphadenopathy. CT scan showed a 3.1 x 2.7 x 3.9 cm renal cortical mass in the right kidney demonstrating irregular margins, he also had a separate simple appearing 2.4 cm right renal cyst no hydronephrosis, he had diffuse bladder wall thickening and haziness of the perivesical fat and a focus of gas in the nondependent provider there is a 2 cm in length colovesical fistula identified between the superior bladder in the mid sigmoid colon. There was mild sigmoid diverticulosis no definite large bowel wall thickening or pericolonic fat stranding. Patient had been referred to urology, he has seen Dr. Gloriann Loan, and has also seen Dr. Leighton Ruff in consultation for surgery and it sounds as if surgery is being planned for December, both to remove the renal mass and to repair the colovesical fistula. Colonoscopy has been requested. Last colonoscopy July 2016 with finding of 4 polyps in moderate sigmoid diverticulosis. 3 of the polyps were adenomatous, 1 was a tubular villous adenoma 10 mm and he was planned to follow-up in July 2019.   Interestingly patient has no prior history of diverticulitis, he has had no recent complaints of lower abdominal discomfort. He has noticed cloudy-appearing urine intermittently over the past several months and has had a couple of mild urinary tract infections. No hematuria. He has had intermittent pneumaturia.  Review of Systems Pertinent positive and negative review of systems were noted in the above HPI section.  All other review of systems was otherwise negative.  Outpatient Encounter Prescriptions as of 08/31/2017  Medication  Sig  . atorvastatin (LIPITOR) 10 MG tablet Take 10 mg by mouth at bedtime.  . CHANTIX CONTINUING MONTH PAK 1 MG tablet TAKE AS PER PACKAGE DIRECTIONS  . pregabalin (LYRICA) 150 MG capsule Take 1 capsule (150 mg total) by mouth 2 (two) times daily.  Marland Kitchen PROAIR HFA 108 (90 Base) MCG/ACT inhaler   . SYMBICORT 160-4.5 MCG/ACT inhaler   . tamsulosin (FLOMAX) 0.4 MG CAPS capsule Take 0.4 mg by mouth daily.   Facility-Administered Encounter Medications as of 08/31/2017  Medication  . lidocaine (PF) (XYLOCAINE) 1 % injection 0.3 mL  . methylPREDNISolone acetate (DEPO-MEDROL) injection 80 mg   No Known Allergies Patient Active Problem List   Diagnosis Date Noted  . Chronic venous insufficiency 07/20/2017  . Chronic left-sided low back pain without sciatica 11/08/2016  . Toe ulcer ([place]) 03/08/2016  . Verruca 12/28/2015  . Pre-ulcerative calluses 12/28/2015  . Hx of adenomatous colonic polyps 06/09/2015  . Lipoma 03/13/2014  . Plantar warts 07/09/2012  . Cellulitis 07/04/2012  . Neuropathy 07/04/2012  . Hyperglycemia 07/04/2012  . HYPERTENSION, BENIGN 10/20/2010  . EDEMA 08/20/2010  . WISDOM TEETH EXTRACTION, HX OF 08/20/2010  . HYPOALBUMINEMIA 08/19/2010  . ANEMIA 08/19/2010  . NICOTINE ADDICTION 08/19/2010  . NECROTIZING PNEUMONIA 08/19/2010  . OSTEOARTHRITIS 08/19/2010   Social History   Social History  . Marital status: Divorced    Spouse name: N/A  . Number of children: 1  . Years of education: N/A   Occupational History  . retired    Social History Main Topics  . Smoking status: Current Some Day Smoker  Packs/day: 0.20    Years: 40.00    Types: Cigarettes    Start date: 11/29/1973  . Smokeless tobacco: Never Used     Comment: occasional smoker - ~1 pack/week. smoked ~1.5ppd until 5 yr ago  . Alcohol use No  . Drug use: No  . Sexual activity: Not Currently    Partners: Female   Other Topics Concern  . Not on file   Social History Narrative  . No narrative on  file    [name]'s family history includes Breast cancer in his sister; Diabetes in his father; Esophageal cancer in his mother; Heart disease in his mother; Pancreatic cancer in his father.      Objective:    Vitals:   08/31/17 1101  BP: (!) 158/88  Pulse: 92    Physical Exam  well-developed morbidly obese older white male in no acute distress, pleasant blood pressure 158/88 pulse 92, height 6 foot 7, weight 388, BMI 43.1. HEENT; nontraumatic normocephalic EOMI PERRLA sclera anicteric, Cardiovascular; regular rate and rhythm with S1-S2 no murmur or gallop, Pulmonary; clear bilaterally, Abdomen; morbidly obese, soft nontender nondistended bowel sounds active no palpable mass or hepatosplenomegaly, Rectal ;exam not done, Extremities; patient has boots on both feet, chronic problems, Neuropsych; mood and affect appropriate       Assessment & Plan:   #68 61 year old morbidly obese white male referred for preop colonoscopy after finding of colovesical fistula on recent CT scan which was done because of right inguinal lymphadenopathy. Patient has noted intermittent pneumaturia and cloudy urine. He has not had any documented episodes of diverticulitis in the past. #2 history of adenomatous and tubulovillous adenomatous colon polyps-last colonoscopy July 2016 for 3 year interval follow-up #3 right renal mass-rule out renal cell CA #4 hypertension  Plan; Patient will be scheduled for colonoscopy with Dr. Carlean Purl. Patient has a BMI of 43.1, and weight of 388 pounds, therefore procedure will need to be scheduled at the hospital and has been set for mid October at [facility]. Procedure discussed in detail with patient including risks and benefits and he is agreeable to proceed.   Zerenity Bowron S Ocia Simek PA-C 08/31/2017   Cc: [name], MD  Agree with Ms. Genia Harold assessment and plan. [name], MD, [facility]

## 2017-09-05 ENCOUNTER — Telehealth: Payer: Self-pay | Admitting: *Deleted

## 2017-09-05 NOTE — Telephone Encounter (Signed)
Entered in error

## 2017-09-06 ENCOUNTER — Telehealth: Payer: Self-pay | Admitting: *Deleted

## 2017-09-06 ENCOUNTER — Telehealth: Payer: Self-pay | Admitting: Physician Assistant

## 2017-09-06 ENCOUNTER — Other Ambulatory Visit: Payer: Self-pay | Admitting: *Deleted

## 2017-09-06 MED ORDER — NA SULFATE-K SULFATE-MG SULF 17.5-3.13-1.6 GM/177ML PO SOLN: ORAL | 0 refills | Status: DC

## 2017-09-06 NOTE — Telephone Encounter (Signed)
Sent Suprep prescription to [facility] for the patient today, 09-06-2017.

## 2017-09-06 NOTE — Telephone Encounter (Signed)
Pt states he needs prep for colon sch'd at [facility] on 10.24.18 [facility]. Pt had ov on 10.3.18

## 2017-09-07 DIAGNOSIS — L97512 Non-pressure chronic ulcer of other part of right foot with fat layer exposed: Secondary | ICD-10-CM | POA: Diagnosis not present

## 2017-09-07 DIAGNOSIS — G603 Idiopathic progressive neuropathy: Secondary | ICD-10-CM | POA: Diagnosis not present

## 2017-09-07 DIAGNOSIS — I87323 Chronic venous hypertension (idiopathic) with inflammation of bilateral lower extremity: Secondary | ICD-10-CM | POA: Diagnosis not present

## 2017-09-07 DIAGNOSIS — I10 Essential (primary) hypertension: Secondary | ICD-10-CM | POA: Diagnosis not present

## 2017-09-07 DIAGNOSIS — L97529 Non-pressure chronic ulcer of other part of left foot with unspecified severity: Secondary | ICD-10-CM | POA: Diagnosis not present

## 2017-09-07 DIAGNOSIS — J449 Chronic obstructive pulmonary disease, unspecified: Secondary | ICD-10-CM | POA: Diagnosis not present

## 2017-09-07 DIAGNOSIS — L97521 Non-pressure chronic ulcer of other part of left foot limited to breakdown of skin: Secondary | ICD-10-CM | POA: Diagnosis not present

## 2017-09-12 ENCOUNTER — Encounter: Payer: Medicare PPO | Admitting: Internal Medicine

## 2017-09-14 ENCOUNTER — Encounter (HOSPITAL_COMMUNITY): Payer: Self-pay | Admitting: *Deleted

## 2017-09-14 DIAGNOSIS — I87323 Chronic venous hypertension (idiopathic) with inflammation of bilateral lower extremity: Secondary | ICD-10-CM | POA: Diagnosis not present

## 2017-09-14 DIAGNOSIS — J449 Chronic obstructive pulmonary disease, unspecified: Secondary | ICD-10-CM | POA: Diagnosis not present

## 2017-09-14 DIAGNOSIS — I10 Essential (primary) hypertension: Secondary | ICD-10-CM | POA: Diagnosis not present

## 2017-09-14 DIAGNOSIS — L97512 Non-pressure chronic ulcer of other part of right foot with fat layer exposed: Secondary | ICD-10-CM | POA: Diagnosis not present

## 2017-09-14 DIAGNOSIS — L97529 Non-pressure chronic ulcer of other part of left foot with unspecified severity: Secondary | ICD-10-CM | POA: Diagnosis not present

## 2017-09-16 ENCOUNTER — Encounter (HOSPITAL_COMMUNITY): Payer: Self-pay

## 2017-09-21 ENCOUNTER — Ambulatory Visit (HOSPITAL_COMMUNITY): Payer: Medicare PPO | Admitting: Anesthesiology

## 2017-09-21 ENCOUNTER — Ambulatory Visit (HOSPITAL_COMMUNITY)
Admission: RE | Admit: 2017-09-21 | Discharge: 2017-09-21 | Disposition: A | Discharge status: Home / Self Care | Payer: Medicare PPO | Source: Ambulatory Visit | Attending: Internal Medicine | Admitting: Internal Medicine

## 2017-09-21 ENCOUNTER — Encounter (HOSPITAL_COMMUNITY)
Admission: RE | Disposition: A | Discharge status: Home / Self Care | Payer: Self-pay | Source: Ambulatory Visit | Attending: Internal Medicine

## 2017-09-21 ENCOUNTER — Encounter (HOSPITAL_COMMUNITY): Payer: Self-pay | Admitting: Anesthesiology

## 2017-09-21 DIAGNOSIS — Z79899 Other long term (current) drug therapy: Secondary | ICD-10-CM | POA: Insufficient documentation

## 2017-09-21 DIAGNOSIS — M1990 Unspecified osteoarthritis, unspecified site: Secondary | ICD-10-CM | POA: Diagnosis not present

## 2017-09-21 DIAGNOSIS — N321 Vesicointestinal fistula: Secondary | ICD-10-CM | POA: Insufficient documentation

## 2017-09-21 DIAGNOSIS — F17210 Nicotine dependence, cigarettes, uncomplicated: Secondary | ICD-10-CM | POA: Diagnosis not present

## 2017-09-21 DIAGNOSIS — Z86010 Personal history of colonic polyps: Secondary | ICD-10-CM

## 2017-09-21 DIAGNOSIS — Z7982 Long term (current) use of aspirin: Secondary | ICD-10-CM | POA: Diagnosis not present

## 2017-09-21 DIAGNOSIS — K5730 Diverticulosis of large intestine without perforation or abscess without bleeding: Secondary | ICD-10-CM

## 2017-09-21 DIAGNOSIS — G8929 Other chronic pain: Secondary | ICD-10-CM | POA: Insufficient documentation

## 2017-09-21 DIAGNOSIS — Z6841 Body Mass Index (BMI) 40.0 and over, adult: Secondary | ICD-10-CM | POA: Insufficient documentation

## 2017-09-21 DIAGNOSIS — J449 Chronic obstructive pulmonary disease, unspecified: Secondary | ICD-10-CM | POA: Insufficient documentation

## 2017-09-21 DIAGNOSIS — D120 Benign neoplasm of cecum: Secondary | ICD-10-CM

## 2017-09-21 DIAGNOSIS — N2889 Other specified disorders of kidney and ureter: Secondary | ICD-10-CM | POA: Insufficient documentation

## 2017-09-21 DIAGNOSIS — K632 Fistula of intestine: Secondary | ICD-10-CM | POA: Diagnosis present

## 2017-09-21 DIAGNOSIS — I10 Essential (primary) hypertension: Secondary | ICD-10-CM | POA: Insufficient documentation

## 2017-09-21 DIAGNOSIS — K648 Other hemorrhoids: Secondary | ICD-10-CM | POA: Diagnosis not present

## 2017-09-21 HISTORY — DX: Chronic kidney disease, unspecified: N18.9

## 2017-09-21 HISTORY — DX: Chronic obstructive pulmonary disease, unspecified: J44.9

## 2017-09-21 HISTORY — PX: COLONOSCOPY WITH PROPOFOL: SHX5780

## 2017-09-21 SURGERY — COLONOSCOPY WITH PROPOFOL
Anesthesia: Monitor Anesthesia Care

## 2017-09-21 MED ORDER — SODIUM CHLORIDE 0.9 % IV SOLN: INTRAVENOUS | Status: DC

## 2017-09-21 MED ORDER — PROPOFOL 500 MG/50ML IV EMUL
INTRAVENOUS | Status: DC | PRN
  Administered 2017-09-21: 75 ug/kg/min via INTRAVENOUS

## 2017-09-21 MED ORDER — ONDANSETRON HCL 4 MG/2ML IJ SOLN
INTRAMUSCULAR | Status: DC | PRN
  Administered 2017-09-21: 4 mg via INTRAVENOUS

## 2017-09-21 MED ORDER — PROPOFOL 10 MG/ML IV BOLUS
INTRAVENOUS | Status: AC
  Filled 2017-09-21: qty 40

## 2017-09-21 MED ORDER — LACTATED RINGERS IV SOLN
INTRAVENOUS | Status: DC
  Administered 2017-09-21: 09:00:00 via INTRAVENOUS

## 2017-09-21 SURGICAL SUPPLY — 21 items

## 2017-09-21 NOTE — Interval H&P Note (Signed)
History and Physical Interval Note:  09/21/2017 8:29 AM  [name].  has presented today for surgery, with the diagnosis of History of adenomatous colon polyps, [place],   The various methods of treatment have been discussed with the patient and family. After consideration of risks, benefits and other options for treatment, the patient has consented to  Procedure(s): COLONOSCOPY WITH PROPOFOL (N/A) as a surgical intervention .  The patient's history has been reviewed, patient examined, no change in status, stable for surgery.  I have reviewed the patient's chart and labs.  Questions were answered to the patient's satisfaction.     Silvano Rusk

## 2017-09-21 NOTE — Transfer of Care (Signed)
Immediate Anesthesia Transfer of Care Note  Patient: [name].  Procedure(s) Performed: COLONOSCOPY WITH PROPOFOL (N/A )  Patient Location: PACU  Anesthesia Type:MAC  Level of Consciousness: awake  Airway & Oxygen Therapy: Patient Spontanous Breathing and Patient connected to face mask oxygen  Post-op Assessment: Report given to RN and Post -op Vital signs reviewed and stable  Post vital signs: Reviewed and stable  Last Vitals:  Vitals:   09/21/17 0841  BP: 139/77  Pulse: 75  Resp: 15  Temp: 36.7 C  SpO2: 96%    Last Pain:  Vitals:   09/21/17 0841  TempSrc: Oral         Complications: No apparent anesthesia complications

## 2017-09-21 NOTE — Anesthesia Preprocedure Evaluation (Signed)
Anesthesia Evaluation  Patient identified by MRN, date of birth, ID band Patient awake    Reviewed: Allergy & Precautions, NPO status , Patient's Chart, lab work & pertinent test results  Airway Mallampati: II  TM Distance: >3 FB Neck ROM: Full    Dental no notable dental hx.    Pulmonary COPD, Current Smoker,    Pulmonary exam normal breath sounds clear to auscultation       Cardiovascular hypertension, Pt. on medications Normal cardiovascular exam Rhythm:Regular Rate:Normal     Neuro/Psych negative neurological ROS  negative psych ROS   GI/Hepatic negative GI ROS, Neg liver ROS,   Endo/Other  Morbid obesity  Renal/GU negative Renal ROS  negative genitourinary   Musculoskeletal negative musculoskeletal ROS (+)   Abdominal   Peds negative pediatric ROS (+)  Hematology negative hematology ROS (+)   Anesthesia Other Findings   Reproductive/Obstetrics negative OB ROS                             Anesthesia Physical Anesthesia Plan  ASA: III  Anesthesia Plan: MAC   Post-op Pain Management:    Induction: Intravenous  PONV Risk Score and Plan: 1 and Ondansetron, Dexamethasone and Treatment may vary due to age or medical condition  Airway Management Planned: Simple Face Mask  Additional Equipment:   Intra-op Plan:   Post-operative Plan:   Informed Consent: I have reviewed the patients History and Physical, chart, labs and discussed the procedure including the risks, benefits and alternatives for the proposed anesthesia with the patient or authorized representative who has indicated his/her understanding and acceptance.   Dental advisory given  Plan Discussed with: CRNA  Anesthesia Plan Comments:         Anesthesia Quick Evaluation

## 2017-09-21 NOTE — Anesthesia Postprocedure Evaluation (Signed)
Anesthesia Post Note  Patient: [name].  Procedure(s) Performed: COLONOSCOPY WITH PROPOFOL (N/A )     Patient location during evaluation: Endoscopy Anesthesia Type: MAC Level of consciousness: awake and alert Pain management: pain level controlled Vital Signs Assessment: post-procedure vital signs reviewed and stable Respiratory status: spontaneous breathing, nonlabored ventilation, respiratory function stable and patient connected to nasal cannula oxygen Cardiovascular status: stable and blood pressure returned to baseline Postop Assessment: no apparent nausea or vomiting Anesthetic complications: no    Last Vitals:  Vitals:   09/21/17 0940 09/21/17 0950  BP: 115/72 115/72  Pulse: 81 74  Resp: 16 17  Temp:    SpO2: 100% 98%    Last Pain:  Vitals:   09/21/17 0934  TempSrc: Oral                 Montez Hageman

## 2017-09-21 NOTE — Op Note (Signed)
[facility] Patient Name: [name] Procedure Date: 09/21/2017 MRN: [id number] Attending MD: [name] , MD Date of Birth: Sep 23, 1956 CSN: [id number] Age: 61 Admit Type: Outpatient Procedure:                Colonoscopy Indications:              Colovesical fistula, OTHER Providers:                [name], MD, Cleda Daub, RN, William Dalton, Technician Referring MD:              Medicines:                Propofol per Anesthesia, Monitored Anesthesia Care Complications:            No immediate complications. Estimated Blood Loss:     Estimated blood loss was minimal. Procedure:                Pre-Anesthesia Assessment:                           - Prior to the procedure, a History and Physical                            was performed, and patient medications and                            allergies were reviewed. The patient's tolerance of                            previous anesthesia was also reviewed. The risks                            and benefits of the procedure and the sedation                            options and risks were discussed with the patient.                            All questions were answered, and informed consent                            was obtained. Prior Anticoagulants: The patient has                            taken no previous anticoagulant or antiplatelet                            agents. ASA Grade Assessment: III - A patient with                            severe systemic disease. After reviewing the risks  and benefits, the patient was deemed in                            satisfactory condition to undergo the procedure.                           After obtaining informed consent, the colonoscope                            was passed under direct vision. Throughout the                            procedure, the patient's blood pressure, pulse, and                             oxygen saturations were monitored continuously. The                            EC-[id number] ([id number]) scope was introduced through                            the anus and advanced to the the cecum, identified                            by appendiceal orifice and ileocecal valve. The                            colonoscopy was performed without difficulty. The                            patient tolerated the procedure well. The quality                            of the bowel preparation was good. The bowel                            preparation used was SUPREP. The ileocecal valve                            and the rectum were photographed. Scope In: 9:16:13 AM Scope Out: 9:28:55 AM Scope Withdrawal Time: 0 hours 9 minutes 29 seconds  Total Procedure Duration: 0 hours 12 minutes 42 seconds  Findings:      The perianal and digital rectal examinations were normal. Pertinent       negatives include normal prostate (size, shape, and consistency).      A diminutive polyp was found in the cecum. The polyp was sessile. The       polyp was removed with a cold snare. Resection and retrieval were       complete. Verification of patient identification for the specimen was       done. Estimated blood loss was minimal.      Multiple small and large-mouthed diverticula were found in the sigmoid       colon.      Internal hemorrhoids were found during retroflexion.  The exam was otherwise without abnormality on direct and retroflexion       views. Impression:               - One diminutive polyp in the cecum, removed with a                            cold snare. Resected and retrieved.                           - Moderate diverticulosis in the sigmoid colon.                           - Internal hemorrhoids.                           - The examination was otherwise normal on direct                            and retroflexion views. Moderate Sedation:      N/A- Per Anesthesia  Care Recommendation:           - Patient has a contact number available for                            emergencies. The signs and symptoms of potential                            delayed complications were discussed with the                            patient. Return to normal activities tomorrow.                            Written discharge instructions were provided to the                            patient.                           - Resume previous diet.                           - Continue present medications.                           - Repeat colonoscopy is recommended for                            surveillance. The colonoscopy date will be                            determined after pathology results from today's                            exam become available for review.                           -  No issues seen here that would affect surgery for                            colo-vesical fistula Procedure Code(s):        --- Professional ---                           [phone], Colonoscopy, flexible; with removal of                            tumor(s), polyp(s), or other lesion(s) by snare                            technique Diagnosis Code(s):        --- Professional ---                           D12.0, Benign neoplasm of cecum                           K64.8, Other hemorrhoids                           N32.1, Vesicointestinal fistula                           K57.30, Diverticulosis of large intestine without                            perforation or abscess without bleeding CPT copyright 2016 American Medical Association. All rights reserved. The codes documented in this report are preliminary and upon coder review may  be revised to meet current compliance requirements. [name], MD 09/21/2017 9:47:38 AM This report has been signed electronically. Number of Addenda: 0

## 2017-09-21 NOTE — Discharge Instructions (Signed)
YOU HAD AN ENDOSCOPIC PROCEDURE TODAY: Refer to the procedure report and other information in the discharge instructions given to you for any specific questions about what was found during the examination. If this information does not answer your questions, please call [place] office at [phone] to clarify.  ° °YOU SHOULD EXPECT: Some feelings of bloating in the abdomen. Passage of more gas than usual. Walking can help get rid of the air that was put into your GI tract during the procedure and reduce the bloating. If you had a lower endoscopy (such as a colonoscopy or flexible sigmoidoscopy) you may notice spotting of blood in your stool or on the toilet paper. Some abdominal soreness may be present for a day or two, also. ° °DIET: Your first meal following the procedure should be a light meal and then it is ok to progress to your normal diet. A half-sandwich or bowl of soup is an example of a good first meal. Heavy or fried foods are harder to digest and may make you feel nauseous or bloated. Drink plenty of fluids but you should avoid alcoholic beverages for 24 hours. If you had a esophageal dilation, please see attached instructions for diet.   ° °ACTIVITY: Your care partner should take you home directly after the procedure. You should plan to take it easy, moving slowly for the rest of the day. You can resume normal activity the day after the procedure however YOU SHOULD NOT DRIVE, use power tools, machinery or perform tasks that involve climbing or major physical exertion for 24 hours (because of the sedation medicines used during the test).  ° °SYMPTOMS TO REPORT IMMEDIATELY: °A gastroenterologist can be reached at any hour. Please call [phone]  for any of the following symptoms:  °Following lower endoscopy (colonoscopy, flexible sigmoidoscopy) °Excessive amounts of blood in the stool  °Significant tenderness, worsening of abdominal pains  °Swelling of the abdomen that is new, acute  °Fever of 100° or  higher  ° ° °FOLLOW UP:  °If any biopsies were taken you will be contacted by phone or by letter within the next 1-3 weeks. Call [phone]  if you have not heard about the biopsies in 3 weeks.  °Please also call with any specific questions about appointments or follow up tests. ° ° ° °Monitored Anesthesia Care, Care After °These instructions provide you with information about caring for yourself after your procedure. Your health care provider may also give you more specific instructions. Your treatment has been planned according to current medical practices, but problems sometimes occur. Call your health care provider if you have any problems or questions after your procedure. °What can I expect after the procedure? °After your procedure, it is common to: °· Feel sleepy for several hours. °· Feel clumsy and have poor balance for several hours. °· Feel forgetful about what happened after the procedure. °· Have poor judgment for several hours. °· Feel nauseous or vomit. °· Have a sore throat if you had a breathing tube during the procedure. ° °Follow these instructions at home: °For at least 24 hours after the procedure: ° °· Do not: °? Participate in activities in which you could fall or become injured. °? Drive. °? Use heavy machinery. °? Drink alcohol. °? Take sleeping pills or medicines that cause drowsiness. °? Make important decisions or sign legal documents. °? Take care of children on your own. °· Rest. °Eating and drinking °· Follow the diet that is recommended by your health care provider. °· If   you vomit, drink water, juice, or soup when you can drink without vomiting. °· Make sure you have little or no nausea before eating solid foods. °General instructions °· Have a responsible adult stay with you until you are awake and alert. °· Take over-the-counter and prescription medicines only as told by your health care provider. °· If you smoke, do not smoke without supervision. °· Keep all follow-up visits as  told by your health care provider. This is important. °Contact a health care provider if: °· You keep feeling nauseous or you keep vomiting. °· You feel light-headed. °· You develop a rash. °· You have a fever. °Get help right away if: °· You have trouble breathing. °This information is not intended to replace advice given to you by your health care provider. Make sure you discuss any questions you have with your health care provider. °Document Released: 03/07/2016 Document Revised: 07/07/2016 Document Reviewed: 03/07/2016 °Elsevier Interactive Patient Education © 2018 [employer]. ° ° °

## 2017-09-21 NOTE — H&P (View-Only) (Signed)
Subjective:    Patient ID: [name]., male    DOB: Aug 12, 1956, 61 y.o.   MRN: [id number]  HPI [name] is a pleasant 61 year old white male, known to Dr. Carlean Purl from prior procedures. He comes in today to discuss preop colonoscopy. Patient had undergone CT of the abdomen and pelvis on 07/15/2017 after he was noted to have some right inguinal lymphadenopathy. CT scan showed a 3.1 x 2.7 x 3.9 cm renal cortical mass in the right kidney demonstrating irregular margins, he also had a separate simple appearing 2.4 cm right renal cyst no hydronephrosis, he had diffuse bladder wall thickening and haziness of the perivesical fat and a focus of gas in the nondependent provider there is a 2 cm in length colovesical fistula identified between the superior bladder in the mid sigmoid colon. There was mild sigmoid diverticulosis no definite large bowel wall thickening or pericolonic fat stranding. Patient had been referred to urology, he has seen Dr. Gloriann Loan, and has also seen Dr. Leighton Ruff in consultation for surgery and it sounds as if surgery is being planned for December, both to remove the renal mass and to repair the colovesical fistula. Colonoscopy has been requested. Last colonoscopy July 2016 with finding of 4 polyps in moderate sigmoid diverticulosis. 3 of the polyps were adenomatous, 1 was a tubular villous adenoma 10 mm and he was planned to follow-up in July 2019.   Interestingly patient has no prior history of diverticulitis, he has had no recent complaints of lower abdominal discomfort. He has noticed cloudy-appearing urine intermittently over the past several months and has had a couple of mild urinary tract infections. No hematuria. He has had intermittent pneumaturia.  Review of Systems Pertinent positive and negative review of systems were noted in the above HPI section.  All other review of systems was otherwise negative.  Outpatient Encounter Prescriptions as of 08/31/2017  Medication  Sig  . atorvastatin (LIPITOR) 10 MG tablet Take 10 mg by mouth at bedtime.  . CHANTIX CONTINUING MONTH PAK 1 MG tablet TAKE AS PER PACKAGE DIRECTIONS  . pregabalin (LYRICA) 150 MG capsule Take 1 capsule (150 mg total) by mouth 2 (two) times daily.  Marland Kitchen PROAIR HFA 108 (90 Base) MCG/ACT inhaler   . SYMBICORT 160-4.5 MCG/ACT inhaler   . tamsulosin (FLOMAX) 0.4 MG CAPS capsule Take 0.4 mg by mouth daily.   Facility-Administered Encounter Medications as of 08/31/2017  Medication  . lidocaine (PF) (XYLOCAINE) 1 % injection 0.3 mL  . methylPREDNISolone acetate (DEPO-MEDROL) injection 80 mg   No Known Allergies Patient Active Problem List   Diagnosis Date Noted  . Chronic venous insufficiency 07/20/2017  . Chronic left-sided low back pain without sciatica 11/08/2016  . Toe ulcer ([place]) 03/08/2016  . Verruca 12/28/2015  . Pre-ulcerative calluses 12/28/2015  . Hx of adenomatous colonic polyps 06/09/2015  . Lipoma 03/13/2014  . Plantar warts 07/09/2012  . Cellulitis 07/04/2012  . Neuropathy 07/04/2012  . Hyperglycemia 07/04/2012  . HYPERTENSION, BENIGN 10/20/2010  . EDEMA 08/20/2010  . WISDOM TEETH EXTRACTION, HX OF 08/20/2010  . HYPOALBUMINEMIA 08/19/2010  . ANEMIA 08/19/2010  . NICOTINE ADDICTION 08/19/2010  . NECROTIZING PNEUMONIA 08/19/2010  . OSTEOARTHRITIS 08/19/2010   Social History   Social History  . Marital status: Divorced    Spouse name: N/A  . Number of children: 1  . Years of education: N/A   Occupational History  . retired    Social History Main Topics  . Smoking status: Current Some Day Smoker  Packs/day: 0.20    Years: 40.00    Types: Cigarettes    Start date: 11/29/1973  . Smokeless tobacco: Never Used     Comment: occasional smoker - ~1 pack/week. smoked ~1.5ppd until 5 yr ago  . Alcohol use No  . Drug use: No  . Sexual activity: Not Currently    Partners: Female   Other Topics Concern  . Not on file   Social History Narrative  . No narrative on  file    [name]'s family history includes Breast cancer in his sister; Diabetes in his father; Esophageal cancer in his mother; Heart disease in his mother; Pancreatic cancer in his father.      Objective:    Vitals:   08/31/17 1101  BP: (!) 158/88  Pulse: 92    Physical Exam  well-developed morbidly obese older white male in no acute distress, pleasant blood pressure 158/88 pulse 92, height 6 foot 7, weight 388, BMI 43.1. HEENT; nontraumatic normocephalic EOMI PERRLA sclera anicteric, Cardiovascular; regular rate and rhythm with S1-S2 no murmur or gallop, Pulmonary; clear bilaterally, Abdomen; morbidly obese, soft nontender nondistended bowel sounds active no palpable mass or hepatosplenomegaly, Rectal ;exam not done, Extremities; patient has boots on both feet, chronic problems, Neuropsych; mood and affect appropriate       Assessment & Plan:   #60 61 year old morbidly obese white male referred for preop colonoscopy after finding of colovesical fistula on recent CT scan which was done because of right inguinal lymphadenopathy. Patient has noted intermittent pneumaturia and cloudy urine. He has not had any documented episodes of diverticulitis in the past. #2 history of adenomatous and tubulovillous adenomatous colon polyps-last colonoscopy July 2016 for 3 year interval follow-up #3 right renal mass-rule out renal cell CA #4 hypertension  Plan; Patient will be scheduled for colonoscopy with Dr. Carlean Purl. Patient has a BMI of 43.1, and weight of 388 pounds, therefore procedure will need to be scheduled at the hospital and has been set for mid October at [facility]. Procedure discussed in detail with patient including risks and benefits and he is agreeable to proceed.   Amy S Esterwood PA-C 08/31/2017   Cc: [name], MD

## 2017-09-22 ENCOUNTER — Encounter (HOSPITAL_COMMUNITY): Payer: Self-pay | Admitting: Internal Medicine

## 2017-09-25 ENCOUNTER — Encounter: Payer: Self-pay | Admitting: Internal Medicine

## 2017-09-25 NOTE — Progress Notes (Signed)
Adenoma(s) 5 yr recall 2023 Letter sent by My Chart

## 2017-10-03 DIAGNOSIS — Z89422 Acquired absence of other left toe(s): Secondary | ICD-10-CM | POA: Diagnosis not present

## 2017-10-03 DIAGNOSIS — N400 Enlarged prostate without lower urinary tract symptoms: Secondary | ICD-10-CM | POA: Diagnosis not present

## 2017-10-03 DIAGNOSIS — Z720 Tobacco use: Secondary | ICD-10-CM | POA: Diagnosis not present

## 2017-10-03 DIAGNOSIS — M545 Low back pain: Secondary | ICD-10-CM | POA: Diagnosis not present

## 2017-10-03 DIAGNOSIS — E785 Hyperlipidemia, unspecified: Secondary | ICD-10-CM | POA: Diagnosis not present

## 2017-10-05 ENCOUNTER — Encounter (HOSPITAL_BASED_OUTPATIENT_CLINIC_OR_DEPARTMENT_OTHER): Payer: Medicare PPO | Attending: Surgery

## 2017-10-05 DIAGNOSIS — J449 Chronic obstructive pulmonary disease, unspecified: Secondary | ICD-10-CM | POA: Diagnosis not present

## 2017-10-05 DIAGNOSIS — I10 Essential (primary) hypertension: Secondary | ICD-10-CM | POA: Diagnosis not present

## 2017-10-05 DIAGNOSIS — Z87891 Personal history of nicotine dependence: Secondary | ICD-10-CM | POA: Insufficient documentation

## 2017-10-05 DIAGNOSIS — I87323 Chronic venous hypertension (idiopathic) with inflammation of bilateral lower extremity: Secondary | ICD-10-CM | POA: Diagnosis not present

## 2017-10-05 DIAGNOSIS — G603 Idiopathic progressive neuropathy: Secondary | ICD-10-CM | POA: Diagnosis not present

## 2017-10-05 DIAGNOSIS — L97522 Non-pressure chronic ulcer of other part of left foot with fat layer exposed: Secondary | ICD-10-CM | POA: Diagnosis not present

## 2017-10-05 DIAGNOSIS — Z89411 Acquired absence of right great toe: Secondary | ICD-10-CM | POA: Diagnosis not present

## 2017-10-05 DIAGNOSIS — L97529 Non-pressure chronic ulcer of other part of left foot with unspecified severity: Secondary | ICD-10-CM | POA: Diagnosis not present

## 2017-10-05 DIAGNOSIS — L97512 Non-pressure chronic ulcer of other part of right foot with fat layer exposed: Secondary | ICD-10-CM | POA: Insufficient documentation

## 2017-10-24 ENCOUNTER — Ambulatory Visit: Payer: Medicare PPO | Admitting: Vascular Surgery

## 2017-10-24 ENCOUNTER — Encounter: Payer: Self-pay | Admitting: Vascular Surgery

## 2017-10-24 VITALS — BP 155/90 | HR 92 | Temp 98.7°F | Resp 20 | Ht 79.0 in | Wt 393.0 lb

## 2017-10-24 DIAGNOSIS — L97529 Non-pressure chronic ulcer of other part of left foot with unspecified severity: Secondary | ICD-10-CM

## 2017-10-24 DIAGNOSIS — L97519 Non-pressure chronic ulcer of other part of right foot with unspecified severity: Secondary | ICD-10-CM | POA: Insufficient documentation

## 2017-10-24 NOTE — Progress Notes (Signed)
Subjective:     Patient ID: [name]., male   DOB: 15-Nov-1956, 61 y.o.   MRN: [id number]  HPI This 61 year old male returns for 3 month follow-up regarding his ulcerations in both lower extremities. Patient was seen by Dr. Geryl Councilman 3 months ago and noted to have palpable pulses in the dorsalis pedis of both feet with some venous insufficiency. Patient has no history of DVT thrombophlebitis or bleeding. He has had a chronic trophic ulcer on the left first toe and also on the right first toe which required amputation and now the right fifth toe. He does not have diabetes mellitus but does have chronic neuropathy. He is going to the wound center but only for the past 2-3 months. He goes every 2 weeks and changes his dressings at home every other day. He's had some mild improvement. He does not have significant swelling as the day progresses.  Past Medical History:  Diagnosis Date  . Chronic kidney disease    mass r kidney   . COPD (chronic obstructive pulmonary disease) ([place])   . Hx of adenomatous colonic polyps 06/09/2015  . Hyperlipidemia   . Hypertension    has previously been on medication but then taken off  . Neuromuscular disorder ([facility])    neuropathy lower legs and feet  . Neuropathy    in both feet  . Pneumonia 2011    Social History   Tobacco Use  . Smoking status: Current Some Day Smoker    Packs/day: 0.10    Years: 40.00    Pack years: 4.00    Types: Cigarettes    Start date: 11/29/1973  . Smokeless tobacco: Never Used  . Tobacco comment: occasional smoker - ~1 pack/week. smoked ~1.5ppd until 5 yr ago  Substance Use Topics  . Alcohol use: No    Alcohol/week: 0.0 oz    Family History  Problem Relation Age of Onset  . Heart disease Mother   . Esophageal cancer Mother   . Diabetes Father   . Pancreatic cancer Father   . Breast cancer Sister   . Colon cancer Neg Hx   . Rectal cancer Neg Hx   . Stomach cancer Neg Hx   . Colon polyps Neg Hx     No Known  Allergies   Current Outpatient Medications:  .  aspirin 81 MG chewable tablet, Chew 81 mg by mouth daily., Disp: , Rfl:  .  atorvastatin (LIPITOR) 10 MG tablet, Take 10 mg by mouth daily. , Disp: , Rfl: 5 .  b complex vitamins tablet, Take 1 tablet by mouth daily., Disp: , Rfl:  .  folic acid (FOLVITE) 1 MG tablet, Take 1 mg by mouth daily., Disp: , Rfl:  .  pregabalin (LYRICA) 150 MG capsule, Take 1 capsule (150 mg total) by mouth 2 (two) times daily., Disp: 60 capsule, Rfl: 4 .  PROAIR HFA 108 (90 Base) MCG/ACT inhaler, Inhale 2 puffs into the lungs every 6 (six) hours as needed (for wheezing/shortness of breath.). , Disp: , Rfl:  .  SYMBICORT 160-4.5 MCG/ACT inhaler, Inhale 2 puffs into the lungs 2 (two) times daily. , Disp: , Rfl:  .  tamsulosin (FLOMAX) 0.4 MG CAPS capsule, Take 0.4 mg by mouth daily., Disp: , Rfl:  .  TURMERIC PO, Take 2.88 g by mouth daily., Disp: , Rfl:  .  varenicline (CHANTIX) 1 MG tablet, Take 1 mg by mouth 2 (two) times daily., Disp: , Rfl:   Vitals:   10/24/17 1003  BP: (!) 155/90  Pulse: 92  Resp: 20  Temp: 98.7 F (37.1 C)  TempSrc: Oral  SpO2: 99%  Weight: (!) 393 lb (178.3 kg)  Height: 6\' 7"  (2.007 m)    Body mass index is 44.27 kg/m.         Review of Systems Denies chest pain, dyspnea on exertion, PND, orthopnea.    Objective:   Physical Exam BP (!) 155/90 (BP Location: Left Arm, Patient Position: Sitting, Cuff Size: Normal)   Pulse 92   Temp 98.7 F (37.1 C) (Oral)   Resp 20   Ht 6\' 7"  (2.007 m)   Wt (!) 393 lb (178.3 kg)   SpO2 99%   BMI 44.27 kg/m   Gen. obese male no apparent distress alert and oriented 3 Lungs no rhonchi or wheezing Dressing on right distal foot. Patient has previous right first toe amputation and has chronic ulceration right fifth toe. Left foot has chronic ulceration first toe. No significant swelling distally. Minimal hyperpigmentation. Only one bulging varicosities noted in the left lower leg  pretibial area. 2+ dorsalis pedis pulse palpable bilaterally.  I reviewed the venous reflux exam which was performed 07/11/2017. This does reveal reveal some very minimal enlargement of bilateral great saphenous vein on the right and somewhat larger vein on the left but no consistent reflux throughout the great saphenous veins. Right small saphenous vein is also enlarged with some reflux.     Assessment:     Chronic trophic ulceration left first toe, status post right first toe amputation, chronic ulcer right fifth toe in patient with chronic neuropathy Very mild chronic venous insufficiency bilaterally with mild enlargement bilateral great saphenous veins with some areas of reflux-  I do not think the venous insufficiency in the superficial system is contributing to these ulcers which are not typical venous stasis ulcers but appear more to be trophic ulcers    Plan:     Would recommend continuing wound care center treatment aggressively and right fifth toe amputation may become necessary as well as left first toe amputation No indication for venous intervention at this time

## 2017-10-26 DIAGNOSIS — I87323 Chronic venous hypertension (idiopathic) with inflammation of bilateral lower extremity: Secondary | ICD-10-CM | POA: Diagnosis not present

## 2017-10-26 DIAGNOSIS — L97512 Non-pressure chronic ulcer of other part of right foot with fat layer exposed: Secondary | ICD-10-CM | POA: Diagnosis not present

## 2017-10-26 DIAGNOSIS — L97529 Non-pressure chronic ulcer of other part of left foot with unspecified severity: Secondary | ICD-10-CM | POA: Diagnosis not present

## 2017-10-26 DIAGNOSIS — I10 Essential (primary) hypertension: Secondary | ICD-10-CM | POA: Diagnosis not present

## 2017-10-26 DIAGNOSIS — Z89411 Acquired absence of right great toe: Secondary | ICD-10-CM | POA: Diagnosis not present

## 2017-10-26 DIAGNOSIS — G603 Idiopathic progressive neuropathy: Secondary | ICD-10-CM | POA: Diagnosis not present

## 2017-10-26 DIAGNOSIS — J449 Chronic obstructive pulmonary disease, unspecified: Secondary | ICD-10-CM | POA: Diagnosis not present

## 2017-10-26 DIAGNOSIS — L97522 Non-pressure chronic ulcer of other part of left foot with fat layer exposed: Secondary | ICD-10-CM | POA: Diagnosis not present

## 2017-10-26 DIAGNOSIS — Z87891 Personal history of nicotine dependence: Secondary | ICD-10-CM | POA: Diagnosis not present

## 2017-11-09 ENCOUNTER — Ambulatory Visit: Payer: Medicare PPO | Admitting: Nurse Practitioner

## 2017-11-09 ENCOUNTER — Encounter (HOSPITAL_BASED_OUTPATIENT_CLINIC_OR_DEPARTMENT_OTHER): Payer: Medicare PPO | Attending: Internal Medicine

## 2017-11-09 DIAGNOSIS — F17200 Nicotine dependence, unspecified, uncomplicated: Secondary | ICD-10-CM | POA: Diagnosis not present

## 2017-11-09 DIAGNOSIS — I87323 Chronic venous hypertension (idiopathic) with inflammation of bilateral lower extremity: Secondary | ICD-10-CM | POA: Diagnosis not present

## 2017-11-09 DIAGNOSIS — L89899 Pressure ulcer of other site, unspecified stage: Secondary | ICD-10-CM | POA: Diagnosis not present

## 2017-11-09 DIAGNOSIS — L84 Corns and callosities: Secondary | ICD-10-CM | POA: Diagnosis not present

## 2017-11-09 DIAGNOSIS — L97512 Non-pressure chronic ulcer of other part of right foot with fat layer exposed: Secondary | ICD-10-CM | POA: Diagnosis not present

## 2017-11-09 DIAGNOSIS — G603 Idiopathic progressive neuropathy: Secondary | ICD-10-CM | POA: Insufficient documentation

## 2017-11-09 DIAGNOSIS — L97522 Non-pressure chronic ulcer of other part of left foot with fat layer exposed: Secondary | ICD-10-CM | POA: Diagnosis not present

## 2017-11-09 DIAGNOSIS — I10 Essential (primary) hypertension: Secondary | ICD-10-CM | POA: Insufficient documentation

## 2017-11-09 DIAGNOSIS — Z6841 Body Mass Index (BMI) 40.0 and over, adult: Secondary | ICD-10-CM | POA: Insufficient documentation

## 2017-11-09 DIAGNOSIS — J449 Chronic obstructive pulmonary disease, unspecified: Secondary | ICD-10-CM | POA: Insufficient documentation

## 2017-11-09 DIAGNOSIS — L928 Other granulomatous disorders of the skin and subcutaneous tissue: Secondary | ICD-10-CM | POA: Diagnosis not present

## 2017-11-09 NOTE — Progress Notes (Deleted)
[facility]  PATIENT: [name]. DOB: 08-11-1956   REASON FOR VISIT: *** HISTORY FROM:    HISTORY OF PRESENT ILLNESS:[name]. is a 61 y.o. male here as a referral from Dr. Ardeth Perfect for peripheral neuropathy. This has a long standing history of neuropathic and radicular sx in both legs: burning in both feet with racing pain up right leg. Over the last few years, he has had ulcers in this toes on both feet that have required repeated debridements, result in amputations of toes in R foot. Currently he is under wound care with both the [place] at [facility] and also with Podiatry.. He has a past medical history of  COPD, HTN, HLD, morbid obesity, previous amputation toe, multiple ulcerations to the bilateral feet with hx of cellulitis. Patient was referred to pain management. He was bit by a brown recluse spider in 2005 and that is when he noticed neuropathy, the sores on the toes, Started with sores on the toes within 6 months and ongoing since then, he has had amputation of the great right toe, in 2010 and 2011 noticing neuropathy in the feet, progressively worsening. He has numbness, burning, stinging, stabbing, constantly feels like he is walking on tennis balls. No weakness. Balance is impaired. Symptoms above the ankle. Hands are largely unaffected but digit 2 in each hand often gets cold and numb. He went for many years after the start of symptoms without medical, so labs available exceot starting a few years ago.    REVIEW OF SYSTEMS: Full 14 system review of systems performed and notable only for those listed, all others are neg:  Constitutional: neg  Cardiovascular: neg Ear/Nose/Throat: neg  Skin: neg Eyes: neg Respiratory: neg Gastroitestinal: neg  Hematology/Lymphatic: neg  Endocrine: neg Musculoskeletal:neg Allergy/Immunology: neg Neurological: neg Psychiatric: neg Sleep : neg   ALLERGIES: No Known Allergies  HOME  MEDICATIONS: Outpatient Medications Prior to Visit  Medication Sig Dispense Refill  . aspirin 81 MG chewable tablet Chew 81 mg by mouth daily.    Marland Kitchen atorvastatin (LIPITOR) 10 MG tablet Take 10 mg by mouth daily.   5  . b complex vitamins tablet Take 1 tablet by mouth daily.    . folic acid (FOLVITE) 1 MG tablet Take 1 mg by mouth daily.    . pregabalin (LYRICA) 150 MG capsule Take 1 capsule (150 mg total) by mouth 2 (two) times daily. 60 capsule 4  . PROAIR HFA 108 (90 Base) MCG/ACT inhaler Inhale 2 puffs into the lungs every 6 (six) hours as needed (for wheezing/shortness of breath.).     . SYMBICORT 160-4.5 MCG/ACT inhaler Inhale 2 puffs into the lungs 2 (two) times daily.     . tamsulosin (FLOMAX) 0.4 MG CAPS capsule Take 0.4 mg by mouth daily.    . TURMERIC PO Take 2.88 g by mouth daily.    . varenicline (CHANTIX) 1 MG tablet Take 1 mg by mouth 2 (two) times daily.     No facility-administered medications prior to visit.     PAST MEDICAL HISTORY: Past Medical History:  Diagnosis Date  . Chronic kidney disease    mass r kidney   . COPD (chronic obstructive pulmonary disease) ([place])   . Hx of adenomatous colonic polyps 06/09/2015  . Hyperlipidemia   . Hypertension    has previously been on medication but then taken off  . Neuromuscular disorder ([facility])    neuropathy lower legs and feet  . Neuropathy  in both feet  . Pneumonia 2011    PAST SURGICAL HISTORY: Past Surgical History:  Procedure Laterality Date  . AMPUTATION Bilateral 06/05/2013   Procedure: RIGHT GREAT TOE AMPUTATION/LEFT GREAT TOE DEBRIDEMENT;  Surgeon: Wylene Simmer, MD;  Location: [place];  Service: Orthopedics;  Laterality: Bilateral;  . COLONOSCOPY     x 2 last date 09/21/2017 bladder attached to colon eval  . COLONOSCOPY WITH PROPOFOL N/A 09/21/2017   Procedure: COLONOSCOPY WITH PROPOFOL;  Surgeon: [name], MD;  Location: [facility];  Service: Endoscopy;  Laterality: N/A;  . MOUTH SURGERY    . TOE  AMPUTATION Right 06/05/2013   RIGHT GREAT TOE PARTIAL AMPUTATION    . TOOTH EXTRACTION  2011   multiple teeth extracted    FAMILY HISTORY: Family History  Problem Relation Age of Onset  . Heart disease Mother   . Esophageal cancer Mother   . Diabetes Father   . Pancreatic cancer Father   . Breast cancer Sister   . Colon cancer Neg Hx   . Rectal cancer Neg Hx   . Stomach cancer Neg Hx   . Colon polyps Neg Hx     SOCIAL HISTORY: Social History   Socioeconomic History  . Marital status: Divorced    Spouse name: Not on file  . Number of children: 1  . Years of education: Not on file  . Highest education level: Not on file  Social Needs  . Financial resource strain: Not on file  . Food insecurity - worry: Not on file  . Food insecurity - inability: Not on file  . Transportation needs - medical: Not on file  . Transportation needs - non-medical: Not on file  Occupational History  . Occupation: retired  Tobacco Use  . Smoking status: Current Some Day Smoker    Packs/day: 0.10    Years: 40.00    Pack years: 4.00    Types: Cigarettes    Start date: 11/29/1973  . Smokeless tobacco: Never Used  . Tobacco comment: occasional smoker - ~1 pack/week. smoked ~1.5ppd until 5 yr ago  Substance and Sexual Activity  . Alcohol use: No    Alcohol/week: 0.0 oz  . Drug use: No  . Sexual activity: Not Currently    Partners: Female  Other Topics Concern  . Not on file  Social History Narrative  . Not on file     PHYSICAL EXAM  There were no vitals filed for this visit. There is no height or weight on file to calculate BMI.  Generalized: Well developed, in no acute distress  Head: normocephalic and atraumatic,. Oropharynx benign  Neck: Supple, no carotid bruits  Cardiac: Regular rate rhythm, no murmur  Musculoskeletal: No deformity   Neurological examination   Mentation: Alert oriented to time, place, history taking. Attention span and concentration appropriate. Recent and  remote memory intact.  Follows all commands speech and language fluent.   Cranial nerve II-XII: Fundoscopic exam reveals sharp disc margins.Pupils were equal round reactive to light extraocular movements were full, visual field were full on confrontational test. Facial sensation and strength were normal. hearing was intact to finger rubbing bilaterally. Uvula tongue midline. head turning and shoulder shrug were normal and symmetric.Tongue protrusion into cheek strength was normal. Motor: normal bulk and tone, full strength in the BUE, BLE, fine finger movements normal, no pronator drift. No focal weakness Sensory: normal and symmetric to light touch, pinprick, and  Vibration, proprioception  Coordination: finger-nose-finger, heel-to-shin bilaterally, no dysmetria Reflexes: Brachioradialis  2/2, biceps 2/2, triceps 2/2, patellar 2/2, Achilles 2/2, plantar responses were flexor bilaterally. Gait and Station: Rising up from seated position without assistance, normal stance,  moderate stride, good arm swing, smooth turning, able to perform tiptoe, and heel walking without difficulty. Tandem gait is steady  DIAGNOSTIC DATA (LABS, IMAGING, TESTING) - I reviewed patient records, labs, notes, testing and imaging myself where available.  Lab Results  Component Value Date   WBC 9.7 06/14/2017   HGB 13.7 06/14/2017   HCT 41.1 06/14/2017   MCV 92 06/14/2017   PLT 297 06/14/2017      Component Value Date/Time   NA 141 05/17/2017 0731   K 4.7 05/17/2017 0731   CL 102 05/17/2017 0731   CO2 21 05/17/2017 0731   GLUCOSE 126 (H) 05/17/2017 0731   GLUCOSE 109 (H) 01/31/2015 1156   BUN 13 05/17/2017 0731   CREATININE 1.05 05/17/2017 0731   CREATININE 1.01 03/12/2014 1458   CALCIUM 9.0 05/17/2017 0731   PROT 7.2 08/23/2017 0813   ALBUMIN 4.0 03/12/2014 1458   AST 38 (H) 03/12/2014 1458   ALT 21 03/12/2014 1458   ALKPHOS 101 03/12/2014 1458   BILITOT 0.4 03/12/2014 1458   GFRNONAA 77 05/17/2017 0731    GFRAA 89 05/17/2017 0731   Lab Results  Component Value Date   CHOL 181 03/12/2014   HDL 55 03/12/2014   LDLCALC 115 (H) 03/12/2014   TRIG 55 03/12/2014   CHOLHDL 3.3 03/12/2014   Lab Results  Component Value Date   HGBA1C 6.0 (H) 08/23/2017   Lab Results  Component Value Date   VITAMINB12 184 (L) 08/23/2017   Lab Results  Component Value Date   TSH 1.686 03/12/2014     ASSESSMENT AND PLAN    61 y.o. male here as a referral from Dr. Ardeth Perfect for peripheral neuropathy. He has a past medical history of  spider bite, previous amputation toes, multiple ulcerations to the bilateral feet with hx of cellulitis, [facility] more recently wound clinic. Marland Kitchen Recommend pain management (he has been referred), hemoglobin A1c in May 5.6 however HgbA1c on referral states at some point was 8.7(?), Dated 07/08/2017 but patient denies having diabetes? He has not been taking his aspirin, he says due to forgetfullness.  Severe Neuropathy can be due to atherosclerosis, chronic Venous insufficiency, peripheral artery disease  morbid obesity,  smoking, non compliance with medications. MRI of the lumbar spine doesn't show significant nerve root impingement doubt radiculopathy.   Discussed smoking cessation, weight loss, medication compliance especially with aspirin and close follow-up with primary care for tight control of vascular risk factors  Dennie Bible, [facility], [facility], APRN  [facility] [address] [place], [place] [address] [phone]

## 2017-11-10 DIAGNOSIS — R8271 Bacteriuria: Secondary | ICD-10-CM | POA: Diagnosis not present

## 2017-11-10 DIAGNOSIS — N321 Vesicointestinal fistula: Secondary | ICD-10-CM | POA: Diagnosis not present

## 2017-11-15 NOTE — Progress Notes (Signed)
[facility]  PATIENT: [name]. DOB: Jan 18, 1956   REASON FOR VISIT: Follow-up for neuropathy HISTORY FROM: Patient alone at visit    HISTORY OF PRESENT ILLNESS:  UPDATE 12/19/2018CM Mr. [name], 61 year old morbidly obese male returns for follow-up with peripheral neuropathy.  He claims he has had neuropathy since 2005 over the last several years he is also had repeated debridements of patient's of toes in the right foot.  He continues to go to the wound care center and podiatry.  He has wounds to both lower extremities.  He has numbness burning pain in his lower extremities.  He is currently on Lyrica.  He denies any weakness he denies any falls.  Labs after his last visit low B12 and folate he returns for reevaluation  08/23/17 [name]. is a 61 y.o. male here as a referral from Dr. Ardeth Perfect for peripheral neuropathy. This has a long standing history of neuropathic and radicular sx in both legs: burning in both feet with racing pain up right leg. Over the last few years, he has had ulcers in this toes on both feet that have required repeated debridements, result in amputations of toes in R foot. Currently he is under wound care with both the [place] at [facility] and also with Podiatry.. He has a past medical history of  COPD, HTN, HLD, morbid obesity, previous amputation toe, multiple ulcerations to the bilateral feet with hx of cellulitis. Patient was referred to pain management. He was bit by a brown recluse spider in 2005 and that is when he noticed neuropathy, the sores on the toes, Started with sores on the toes within 6 months and ongoing since then, he has had amputation of the great right toe, in 2010 and 2011 noticing neuropathy in the feet, progressively worsening. He has numbness, burning, stinging, stabbing, constantly feels like he is walking on tennis balls. No weakness. Balance is impaired. Symptoms above the ankle. Hands are largely unaffected  but digit 2 in each hand often gets cold and numb. He went for many years after the start of symptoms without medical, so labs available exceot starting a few years ago.    REVIEW OF SYSTEMS: Full 14 system review of systems performed and notable only for those listed, all others are neg:  Constitutional: neg  Cardiovascular: neg Ear/Nose/Throat: neg  Skin: neg Eyes: Blurred vision Respiratory: Shortness of breath Gastroitestinal: neg  Hematology/Lymphatic: neg  Endocrine: neg Musculoskeletal:neg Allergy/Immunology: neg Neurological: Burning pain lower extremities Psychiatric: neg Sleep : neg   ALLERGIES: No Known Allergies  HOME MEDICATIONS: Outpatient Medications Prior to Visit  Medication Sig Dispense Refill  . aspirin 81 MG chewable tablet Chew 81 mg by mouth daily.    Marland Kitchen atorvastatin (LIPITOR) 10 MG tablet Take 10 mg by mouth daily.   5  . b complex vitamins tablet Take 1 tablet by mouth daily.    . folic acid (FOLVITE) 1 MG tablet Take 1 mg by mouth daily.    . pregabalin (LYRICA) 150 MG capsule Take 1 capsule (150 mg total) by mouth 2 (two) times daily. 60 capsule 4  . PROAIR HFA 108 (90 Base) MCG/ACT inhaler Inhale 2 puffs into the lungs every 6 (six) hours as needed (for wheezing/shortness of breath.).     . SYMBICORT 160-4.5 MCG/ACT inhaler Inhale 2 puffs into the lungs 2 (two) times daily.     . tamsulosin (FLOMAX) 0.4 MG CAPS capsule Take 0.4 mg by mouth daily.    Marland Kitchen  TURMERIC PO Take 2.88 g by mouth daily.    . varenicline (CHANTIX) 1 MG tablet Take 1 mg by mouth 2 (two) times daily.     No facility-administered medications prior to visit.     PAST MEDICAL HISTORY: Past Medical History:  Diagnosis Date  . Chronic kidney disease    mass r kidney   . COPD (chronic obstructive pulmonary disease) ([place])   . Hx of adenomatous colonic polyps 06/09/2015  . Hyperlipidemia   . Hypertension    has previously been on medication but then taken off  . Neuromuscular  disorder ([facility])    neuropathy lower legs and feet  . Neuropathy    in both feet  . Pneumonia 2011    PAST SURGICAL HISTORY: Past Surgical History:  Procedure Laterality Date  . AMPUTATION Bilateral 06/05/2013   Procedure: RIGHT GREAT TOE AMPUTATION/LEFT GREAT TOE DEBRIDEMENT;  Surgeon: Wylene Simmer, MD;  Location: [place];  Service: Orthopedics;  Laterality: Bilateral;  . COLONOSCOPY     x 2 last date 09/21/2017 bladder attached to colon eval  . COLONOSCOPY WITH PROPOFOL N/A 09/21/2017   Procedure: COLONOSCOPY WITH PROPOFOL;  Surgeon: [name], MD;  [facility]: [facility];  Service: Endoscopy;  Laterality: N/A;  . MOUTH SURGERY    . TOE AMPUTATION Right 06/05/2013   RIGHT GREAT TOE PARTIAL AMPUTATION    . TOOTH EXTRACTION  2011   multiple teeth extracted    FAMILY HISTORY: Family History  Problem Relation Age of Onset  . Heart disease Mother   . Esophageal cancer Mother   . Diabetes Father   . Pancreatic cancer Father   . Breast cancer Sister   . Colon cancer Neg Hx   . Rectal cancer Neg Hx   . Stomach cancer Neg Hx   . Colon polyps Neg Hx     SOCIAL HISTORY: Social History   Socioeconomic History  . Marital status: Divorced    Spouse name: Not on file  . Number of children: 1  . Years of education: Not on file  . Highest education level: Not on file  Social Needs  . Financial resource strain: Not on file  . Food insecurity - worry: Not on file  . Food insecurity - inability: Not on file  . Transportation needs - medical: Not on file  . Transportation needs - non-medical: Not on file  Occupational History  . Occupation: retired  Tobacco Use  . Smoking status: Current Some Day Smoker    Packs/day: 0.10    Years: 40.00    Pack years: 4.00    Types: Cigarettes    Start date: 11/29/1973  . Smokeless tobacco: Never Used  . Tobacco comment: occasional smoker - ~1 pack/week. smoked ~1.5ppd until 5 yr ago  Substance and Sexual Activity  . Alcohol use: No     Alcohol/week: 0.0 oz  . Drug use: No  . Sexual activity: Not Currently    Partners: Female  Other Topics Concern  . Not on file  Social History Narrative  . Not on file     PHYSICAL EXAM  Vitals:   11/16/17 1506  BP: (!) 159/95  Pulse: 90  Weight: (!) 386 lb 12.8 oz (175.5 kg)  Height: 6\' 7"  (2.007 m)   Body mass index is 43.57 kg/m.  Generalized: Well developed, morbidly obese in no acute distress  Head: normocephalic and atraumatic,. Oropharynx benign  Neck: Supple,  Musculoskeletal: No deformity  Skin 1+ peripheral edema  Neurological examination  Mentation: Alert oriented to time, place, history taking. Attention span and concentration appropriate. Recent and remote memory intact.  Follows all commands speech and language fluent.   Cranial nerve II-XII: Pupils were equal round reactive to light extraocular movements were full, visual field were full on confrontational test. Facial sensation and strength were normal. hearing was intact to finger rubbing bilaterally. Uvula tongue midline. head turning and shoulder shrug were normal and symmetric.Tongue protrusion into cheek strength was normal. Motor: normal bulk and tone, full strength in the BUE, BLE,  Sensory: Decreased sensation to pinprick to below knees, absent vibratory to knees absent proprioception of left great toe  Coordination: finger-nose-finger, heel-to-shin bilaterally, no dysmetria Reflexes: 1+ upper lower and symmetric except absent ankle jerks, plantar responses were flexor bilaterally. Gait and Station: Rising up from seated position without assistance, wide-based stance likely due to both sensory ataxia and  large body habitus ambulates with single-point cane  DIAGNOSTIC DATA (LABS, IMAGING, TESTING) - I reviewed patient records, labs, notes, testing and imaging myself where available.  Lab Results  Component Value Date   WBC 9.7 06/14/2017   HGB 13.7 06/14/2017   HCT 41.1 06/14/2017   MCV 92  06/14/2017   PLT 297 06/14/2017      Component Value Date/Time   NA 141 05/17/2017 0731   K 4.7 05/17/2017 0731   CL 102 05/17/2017 0731   CO2 21 05/17/2017 0731   GLUCOSE 126 (H) 05/17/2017 0731   GLUCOSE 109 (H) 01/31/2015 1156   BUN 13 05/17/2017 0731   CREATININE 1.05 05/17/2017 0731   CREATININE 1.01 03/12/2014 1458   CALCIUM 9.0 05/17/2017 0731   PROT 7.2 08/23/2017 0813   ALBUMIN 4.0 03/12/2014 1458   AST 38 (H) 03/12/2014 1458   ALT 21 03/12/2014 1458   ALKPHOS 101 03/12/2014 1458   BILITOT 0.4 03/12/2014 1458   GFRNONAA 77 05/17/2017 0731   GFRAA 89 05/17/2017 0731   Lab Results  Component Value Date   CHOL 181 03/12/2014   HDL 55 03/12/2014   LDLCALC 115 (H) 03/12/2014   TRIG 55 03/12/2014   CHOLHDL 3.3 03/12/2014   Lab Results  Component Value Date   HGBA1C 6.0 (H) 08/23/2017   Lab Results  Component Value Date   VITAMINB12 184 (L) 08/23/2017   Lab Results  Component Value Date   TSH 1.686 03/12/2014     ASSESSMENT AND PLAN    61 y.o. male here  for peripheral neuropathy. He has a past medical history of  spider bite, previous amputation toes, multiple ulcerations to the bilateral feet with hx of cellulitis, [facility] more recently wound clinic. .  hemoglobin A1c in May 5.6 however HgbA1c on referral states at some point was 8.7(?), Dated 07/08/2017 but patient denies having diabetes?  Low vitamin B12 and folate.  On supplements. Severe Neuropathy  in this particular patient include atherosclerosis, chronic Venous insufficiency, peripheral artery disease morbid obesity, smoking, non compliance with medications  PLAN: Increase Lyrica to 150 mg 3 times daily Stop smoking altogether Continue low-dose aspirin and Lipitor Continue folate and vitamin complex B Use cane at all times for safe ambulation Follow-up in 6 months with Dr. Jaynee Eagles Given information on neuropathic pain I spent 25 minutes in total face to face time with the  patient more than 50% of which was spent counseling and coordination of care, reviewing test results reviewing medications and discussing and reviewing the diagnosis of neuropathic pain and further treatment options.  Patient  made aware he may have to go to a pain clinic at some point to control his pain, Dennie Bible, [facility], [facility], APRN  [name] [address] [place], [place] [address] [phone]

## 2017-11-16 ENCOUNTER — Encounter: Payer: Self-pay | Admitting: Nurse Practitioner

## 2017-11-16 ENCOUNTER — Ambulatory Visit: Payer: Medicare PPO | Admitting: Nurse Practitioner

## 2017-11-16 VITALS — BP 159/95 | HR 90 | Ht 79.0 in | Wt 386.8 lb

## 2017-11-16 DIAGNOSIS — F17200 Nicotine dependence, unspecified, uncomplicated: Secondary | ICD-10-CM | POA: Diagnosis not present

## 2017-11-16 DIAGNOSIS — I872 Venous insufficiency (chronic) (peripheral): Secondary | ICD-10-CM

## 2017-11-16 DIAGNOSIS — G629 Polyneuropathy, unspecified: Secondary | ICD-10-CM

## 2017-11-16 MED ORDER — PREGABALIN 150 MG PO CAPS: 150.0000 mg | ORAL_CAPSULE | Freq: Three times a day (TID) | ORAL | 5 refills | Status: AC

## 2017-11-16 NOTE — Patient Instructions (Addendum)
PLAN: Increase Lyrica to 150 mg 3 times daily Stop smoking altogether Continue low-dose aspirin and Lipitor Follow-up in 6 months with Dr. Jaynee Eagles  Neuropathic Pain Neuropathic pain is pain caused by damage to the nerves that are responsible for certain sensations in your body (sensory nerves). The pain can be caused by damage to:  The sensory nerves that send signals to your spinal cord and brain (peripheral nervous system).  The sensory nerves in your brain or spinal cord (central nervous system).  Neuropathic pain can make you more sensitive to pain. What would be a minor sensation for most people may feel very painful if you have neuropathic pain. This is usually a long-term condition that can be difficult to treat. The type of pain can differ from person to person. It may start suddenly (acute), or it may develop slowly and last for a long time (chronic). Neuropathic pain may come and go as damaged nerves heal or may stay at the same level for years. It often causes emotional distress, loss of sleep, and a lower quality of life. What are the causes? The most common cause of damage to a sensory nerve is diabetes. Many other diseases and conditions can also cause neuropathic pain. Causes of neuropathic pain can be classified as:  Toxic. Many drugs and chemicals can cause toxic damage. The most common cause of toxic neuropathic pain is damage from drug treatment for cancer (chemotherapy).  Metabolic. This type of pain can happen when a disease causes imbalances that damage nerves. Diabetes is the most common of these diseases. Vitamin B deficiency caused by long-term alcohol abuse is another common cause.  Traumatic. Any injury that cuts, crushes, or stretches a nerve can cause damage and pain. A common example is feeling pain after losing an arm or leg (phantom limb pain).  Compression-related. If a sensory nerve gets trapped or compressed for a long period of time, the blood supply to the  nerve can be cut off.  Vascular. Many blood vessel diseases can cause neuropathic pain by decreasing blood supply and oxygen to nerves.  Autoimmune. This type of pain results from diseases in which the body's defense system mistakenly attacks sensory nerves. Examples of autoimmune diseases that can cause neuropathic pain include lupus and multiple sclerosis.  Infectious. Many types of viral infections can damage sensory nerves and cause pain. Shingles infection is a common cause of this type of pain.  Inherited. Neuropathic pain can be a symptom of many diseases that are passed down through families (genetic).  What are the signs or symptoms? The main symptom is pain. Neuropathic pain is often described as:  Burning.  Shock-like.  Stinging.  Hot or cold.  Itching.  How is this diagnosed? No single test can diagnose neuropathic pain. Your health care provider will do a physical exam and ask you about your pain. You may use a pain scale to describe how bad your pain is. You may also have tests to see if you have a high sensitivity to pain and to help find the cause and location of any sensory nerve damage. These tests may include:  Imaging studies, such as: ? X-rays. ? CT scan. ? MRI.  Nerve conduction studies to test how well nerve signals travel through your sensory nerves (electrodiagnostic testing).  Stimulating your sensory nerves through electrodes on your skin and measuring the response in your spinal cord and brain (somatosensory evoked potentials).  How is this treated? Treatment for neuropathic pain may change over time.  You may need to try different treatment options or a combination of treatments. Some options include:  Over-the-counter pain relievers.  Prescription medicines. Some medicines used to treat other conditions may also help neuropathic pain. These include medicines to: ? Control seizures (anticonvulsants). ? Relieve depression  (antidepressants).  Prescription-strength pain relievers (narcotics). These are usually used when other pain relievers do not help.  Transcutaneous nerve stimulation (TENS). This uses electrical currents to block painful nerve signals. The treatment is painless.  Topical and local anesthetics. These are medicines that numb the nerves. They can be injected as a nerve block or applied to the skin.  Alternative treatments, such as: ? Acupuncture. ? Meditation. ? Massage. ? Physical therapy. ? Pain management programs. ? Counseling.  Follow these instructions at home:  Learn as much as you can about your condition.  Take medicines only as directed by your health care provider.  Work closely with all your health care providers to find what works best for you.  Have a good support system at home.  Consider joining a chronic pain support group. Contact a health care provider if:  Your pain treatments are not helping.  You are having side effects from your medicines.  You are struggling with fatigue, mood changes, depression, or anxiety. This information is not intended to replace advice given to you by your health care provider. Make sure you discuss any questions you have with your health care provider. Document Released: 08/12/2004 Document Revised: 06/04/2016 Document Reviewed: 04/25/2014 Elsevier Interactive Patient Education  [employer].

## 2017-11-19 NOTE — Progress Notes (Signed)
Bethany, I don't think patient need to follow up with neurology any further. He can be managed by pcp, we really don't have any further workup for him and I'm sure he appreciates less doctor visits. Let patient know and cancel appointment if he is in agreement.

## 2017-11-19 NOTE — Progress Notes (Signed)
Personally  participated in, made any corrections needed, and agree with history, physical, neuro exam,assessment and plan as stated above.    Rashidah [place], MD [facility] 

## 2017-11-23 DIAGNOSIS — L97522 Non-pressure chronic ulcer of other part of left foot with fat layer exposed: Secondary | ICD-10-CM | POA: Diagnosis not present

## 2017-11-23 DIAGNOSIS — F17200 Nicotine dependence, unspecified, uncomplicated: Secondary | ICD-10-CM | POA: Diagnosis not present

## 2017-11-23 DIAGNOSIS — G603 Idiopathic progressive neuropathy: Secondary | ICD-10-CM | POA: Diagnosis not present

## 2017-11-23 DIAGNOSIS — J449 Chronic obstructive pulmonary disease, unspecified: Secondary | ICD-10-CM | POA: Diagnosis not present

## 2017-11-23 DIAGNOSIS — I87323 Chronic venous hypertension (idiopathic) with inflammation of bilateral lower extremity: Secondary | ICD-10-CM | POA: Diagnosis not present

## 2017-11-23 DIAGNOSIS — L84 Corns and callosities: Secondary | ICD-10-CM | POA: Diagnosis not present

## 2017-11-23 DIAGNOSIS — L97512 Non-pressure chronic ulcer of other part of right foot with fat layer exposed: Secondary | ICD-10-CM | POA: Diagnosis not present

## 2017-11-23 DIAGNOSIS — L89899 Pressure ulcer of other site, unspecified stage: Secondary | ICD-10-CM | POA: Diagnosis not present

## 2017-11-23 DIAGNOSIS — I10 Essential (primary) hypertension: Secondary | ICD-10-CM | POA: Diagnosis not present

## 2017-12-05 ENCOUNTER — Ambulatory Visit: Payer: Self-pay | Admitting: General Surgery

## 2017-12-07 ENCOUNTER — Telehealth: Payer: Self-pay | Admitting: *Deleted

## 2017-12-07 ENCOUNTER — Encounter (HOSPITAL_BASED_OUTPATIENT_CLINIC_OR_DEPARTMENT_OTHER): Payer: Medicare PPO | Attending: Internal Medicine

## 2017-12-07 ENCOUNTER — Other Ambulatory Visit: Payer: Self-pay | Admitting: Urology

## 2017-12-07 DIAGNOSIS — I87323 Chronic venous hypertension (idiopathic) with inflammation of bilateral lower extremity: Secondary | ICD-10-CM | POA: Insufficient documentation

## 2017-12-07 DIAGNOSIS — F17200 Nicotine dependence, unspecified, uncomplicated: Secondary | ICD-10-CM | POA: Insufficient documentation

## 2017-12-07 DIAGNOSIS — L97512 Non-pressure chronic ulcer of other part of right foot with fat layer exposed: Secondary | ICD-10-CM | POA: Diagnosis not present

## 2017-12-07 DIAGNOSIS — I10 Essential (primary) hypertension: Secondary | ICD-10-CM | POA: Diagnosis not present

## 2017-12-07 DIAGNOSIS — Z6841 Body Mass Index (BMI) 40.0 and over, adult: Secondary | ICD-10-CM | POA: Diagnosis not present

## 2017-12-07 DIAGNOSIS — Z89412 Acquired absence of left great toe: Secondary | ICD-10-CM | POA: Insufficient documentation

## 2017-12-07 DIAGNOSIS — L97522 Non-pressure chronic ulcer of other part of left foot with fat layer exposed: Secondary | ICD-10-CM | POA: Insufficient documentation

## 2017-12-07 DIAGNOSIS — J449 Chronic obstructive pulmonary disease, unspecified: Secondary | ICD-10-CM | POA: Insufficient documentation

## 2017-12-07 DIAGNOSIS — L84 Corns and callosities: Secondary | ICD-10-CM | POA: Insufficient documentation

## 2017-12-07 DIAGNOSIS — G603 Idiopathic progressive neuropathy: Secondary | ICD-10-CM | POA: Diagnosis not present

## 2017-12-07 NOTE — Telephone Encounter (Addendum)
Called and spoke with the patient. We discussed that Dr. Jaynee Eagles does not feel that pt requires any further workup from neurology and that he can be managed by his PCP. We can cancel his appointment or he could keep it if he chooses. He agreed and canceled his appt for June. He will call his PCP for further management but will call us if he any further questions. He was appreciative of the call.   ----- Message from Melvenia Beam, MD sent at 11/19/2017  2:22 PM EST -----  Romelle Starcher, I don't think patient need to follow up with neurology any further. He can be managed by pcp, we really don't have any further workup for him and I'm sure he appreciates less doctor visits. Let patient know and cancel appointment if he is in agreement.   ----- Message ----- From: Dennie Bible, NP Sent: 11/16/2017   4:28 PM To: Melvenia Beam, MD

## 2017-12-22 NOTE — Patient Instructions (Addendum)
[name].  12/22/2017   Your procedure is scheduled on: 12-30-17  Report to [facility] Main  Entrance              Report to admitting at     1000AM   Call this number if you have problems the morning of surgery [phone]    Remember: Do not eat food or drink liquids :After Midnight.     Take these medicines the morning of surgery with A SIP OF WATER: FLOMAX, INHALERS AND BRING, LYRICA, LIPITOR                                You may not have any metal on your body including hair pins and              piercings  Do not wear jewelry, make-up, lotions, powders or perfumes, deodorant                      Men may shave face and neck.   Do not bring valuables to the hospital. [place].  Contacts, dentures or bridgework may not be worn into surgery.  Leave suitcase in the car. After surgery it may be brought to your room.                Please read over the following fact sheets you were given: _____________________________________________________________________          [facility] - Preparing for Surgery Before surgery, you can play an important role.  Because skin is not sterile, your skin needs to be as free of germs as possible.  You can reduce the number of germs on your skin by washing with CHG (chlorahexidine gluconate) soap before surgery.  CHG is an antiseptic cleaner which kills germs and bonds with the skin to continue killing germs even after washing. Please DO NOT use if you have an allergy to CHG or antibacterial soaps.  If your skin becomes reddened/irritated stop using the CHG and inform your nurse when you arrive at Short Stay. Do not shave (including legs and underarms) for at least 48 hours prior to the first CHG shower.  You may shave your face/neck. Please follow these instructions carefully:  1.  Shower with CHG Soap the night before surgery and the  morning of Surgery.  2.  If  you choose to wash your hair, wash your hair first as usual with your  normal  shampoo.  3.  After you shampoo, rinse your hair and body thoroughly to remove the  shampoo.                           4.  Use CHG as you would any other liquid soap.  You can apply chg directly  to the skin and wash                       Gently with a scrungie or clean washcloth.  5.  Apply the CHG Soap to your body ONLY FROM THE NECK DOWN.   Do not use on face/ open  Wound or open sores. Avoid contact with eyes, ears mouth and genitals (private parts).                       Wash face,  Genitals (private parts) with your normal soap.             6.  Wash thoroughly, paying special attention to the area where your surgery  will be performed.  7.  Thoroughly rinse your body with warm water from the neck down.  8.  DO NOT shower/wash with your normal soap after using and rinsing off  the CHG Soap.                9.  Pat yourself dry with a clean towel.            10.  Wear clean pajamas.            11.  Place clean sheets on your bed the night of your first shower and do not  sleep with pets. Day of Surgery : Do not apply any lotions/deodorants the morning of surgery.  Please wear clean clothes to the hospital/surgery center.  FAILURE TO FOLLOW THESE INSTRUCTIONS MAY RESULT IN THE CANCELLATION OF YOUR SURGERY PATIENT SIGNATURE_________________________________  NURSE SIGNATURE__________________________________  ________________________________________________________________________  WHAT IS A BLOOD TRANSFUSION? Blood Transfusion Information  A transfusion is the replacement of blood or some of its parts. Blood is made up of multiple cells which provide different functions.  Red blood cells carry oxygen and are used for blood loss replacement.  White blood cells fight against infection.  Platelets control bleeding.  Plasma helps clot blood.  Other blood products are available for  specialized needs, such as hemophilia or other clotting disorders. BEFORE THE TRANSFUSION  Who gives blood for transfusions?   Healthy volunteers who are fully evaluated to make sure their blood is safe. This is blood bank blood. Transfusion therapy is the safest it has ever been in the practice of medicine. Before blood is taken from a donor, a complete history is taken to make sure that person has no history of diseases nor engages in risky social behavior (examples are intravenous drug use or sexual activity with multiple partners). The donor's travel history is screened to minimize risk of transmitting infections, such as malaria. The donated blood is tested for signs of infectious diseases, such as HIV and hepatitis. The blood is then tested to be sure it is compatible with you in order to minimize the chance of a transfusion reaction. If you or a relative donates blood, this is often done in anticipation of surgery and is not appropriate for emergency situations. It takes many days to process the donated blood. RISKS AND COMPLICATIONS Although transfusion therapy is very safe and saves many lives, the main dangers of transfusion include:   Getting an infectious disease.  Developing a transfusion reaction. This is an allergic reaction to something in the blood you were given. Every precaution is taken to prevent this. The decision to have a blood transfusion has been considered carefully by your caregiver before blood is given. Blood is not given unless the benefits outweigh the risks. AFTER THE TRANSFUSION  Right after receiving a blood transfusion, you will usually feel much better and more energetic. This is especially true if your red blood cells have gotten low (anemic). The transfusion raises the level of the red blood cells which carry oxygen, and this usually causes an energy increase.  The  nurse administering the transfusion will monitor you carefully for complications. HOME CARE  INSTRUCTIONS  No special instructions are needed after a transfusion. You may find your energy is better. Speak with your caregiver about any limitations on activity for underlying diseases you may have. SEEK MEDICAL CARE IF:   Your condition is not improving after your transfusion.  You develop redness or irritation at the intravenous (IV) site. SEEK IMMEDIATE MEDICAL CARE IF:  Any of the following symptoms occur over the next 12 hours:  Shaking chills.  You have a temperature by mouth above 102 F (38.9 C), not controlled by medicine.  Chest, back, or muscle pain.  People around you feel you are not acting correctly or are confused.  Shortness of breath or difficulty breathing.  Dizziness and fainting.  You get a rash or develop hives.  You have a decrease in urine output.  Your urine turns a dark color or changes to pink, red, or brown. Any of the following symptoms occur over the next 10 days:  You have a temperature by mouth above 102 F (38.9 C), not controlled by medicine.  Shortness of breath.  Weakness after normal activity.  The white part of the eye turns yellow (jaundice).  You have a decrease in the amount of urine or are urinating less often.  Your urine turns a dark color or changes to pink, red, or brown. Document Released: 11/12/2000 Document Revised: 02/07/2012 Document Reviewed: 07/01/2008 ExitCare Patient Information 2014 [place].  _______________________________________________________________________  Incentive Spirometer  An incentive spirometer is a tool that can help keep your lungs clear and active. This tool measures how well you are filling your lungs with each breath. Taking long deep breaths may help reverse or decrease the chance of developing breathing (pulmonary) problems (especially infection) following:  A long period of time when you are unable to move or be active. BEFORE THE PROCEDURE   If the spirometer includes an  indicator to show your best effort, your nurse or respiratory therapist will set it to a desired goal.  If possible, sit up straight or lean slightly forward. Try not to slouch.  Hold the incentive spirometer in an upright position. INSTRUCTIONS FOR USE  1. Sit on the edge of your bed if possible, or sit up as far as you can in bed or on a chair. 2. Hold the incentive spirometer in an upright position. 3. Breathe out normally. 4. Place the mouthpiece in your mouth and seal your lips tightly around it. 5. Breathe in slowly and as deeply as possible, raising the piston or the ball toward the top of the column. 6. Hold your breath for 3-5 seconds or for as long as possible. Allow the piston or ball to fall to the bottom of the column. 7. Remove the mouthpiece from your mouth and breathe out normally. 8. Rest for a few seconds and repeat Steps 1 through 7 at least 10 times every 1-2 hours when you are awake. Take your time and take a few normal breaths between deep breaths. 9. The spirometer may include an indicator to show your best effort. Use the indicator as a goal to work toward during each repetition. 10. After each set of 10 deep breaths, practice coughing to be sure your lungs are clear. If you have an incision (the cut made at the time of surgery), support your incision when coughing by placing a pillow or rolled up towels firmly against it. Once you are able to get  out of bed, walk around indoors and cough well. You may stop using the incentive spirometer when instructed by your caregiver.  RISKS AND COMPLICATIONS  Take your time so you do not get dizzy or light-headed.  If you are in pain, you may need to take or ask for pain medication before doing incentive spirometry. It is harder to take a deep breath if you are having pain. AFTER USE  Rest and breathe slowly and easily.  It can be helpful to keep track of a log of your progress. Your caregiver can provide you with a simple table  to help with this. If you are using the spirometer at home, follow these instructions: [place] IF:   You are having difficultly using the spirometer.  You have trouble using the spirometer as often as instructed.  Your pain medication is not giving enough relief while using the spirometer.  You develop fever of 100.5 F (38.1 C) or higher. SEEK IMMEDIATE MEDICAL CARE IF:   You cough up bloody sputum that had not been present before.  You develop fever of 102 F (38.9 C) or greater.  You develop worsening pain at or near the incision site. MAKE SURE YOU:   Understand these instructions.  Will watch your condition.  Will get help right away if you are not doing well or get worse. Document Released: 03/28/2007 Document Revised: 02/07/2012 Document Reviewed: 05/29/2007 [facility] Patient Information 2014 [place], [place].   ________________________________________________________________________

## 2017-12-26 ENCOUNTER — Other Ambulatory Visit: Payer: Self-pay

## 2017-12-26 ENCOUNTER — Encounter (HOSPITAL_COMMUNITY): Payer: Self-pay

## 2017-12-26 ENCOUNTER — Encounter (HOSPITAL_COMMUNITY)
Admission: RE | Admit: 2017-12-26 | Discharge: 2017-12-26 | Disposition: A | Discharge status: Home / Self Care | Payer: Medicare PPO | Source: Ambulatory Visit | Attending: General Surgery | Admitting: General Surgery

## 2017-12-26 DIAGNOSIS — Z01818 Encounter for other preprocedural examination: Secondary | ICD-10-CM | POA: Insufficient documentation

## 2017-12-26 DIAGNOSIS — N2889 Other specified disorders of kidney and ureter: Secondary | ICD-10-CM | POA: Insufficient documentation

## 2017-12-26 DIAGNOSIS — I452 Bifascicular block: Secondary | ICD-10-CM | POA: Insufficient documentation

## 2017-12-26 DIAGNOSIS — Z01812 Encounter for preprocedural laboratory examination: Secondary | ICD-10-CM | POA: Insufficient documentation

## 2017-12-26 DIAGNOSIS — Z0183 Encounter for blood typing: Secondary | ICD-10-CM | POA: Diagnosis not present

## 2017-12-26 HISTORY — DX: Personal history of urinary calculi: Z87.442

## 2017-12-26 HISTORY — DX: Unspecified osteoarthritis, unspecified site: M19.90

## 2017-12-26 LAB — CBC
HCT: 44.1 % (ref 39.0–52.0)
Hemoglobin: 14.6 g/dL (ref 13.0–17.0)
MCH: 30.8 pg (ref 26.0–34.0)
MCHC: 33.1 g/dL (ref 30.0–36.0)
MCV: 93 fL (ref 78.0–100.0)
Platelets: 244 10*3/uL (ref 150–400)
RBC: 4.74 MIL/uL (ref 4.22–5.81)
RDW: 13.7 % (ref 11.5–15.5)
WBC: 10 10*3/uL (ref 4.0–10.5)

## 2017-12-26 LAB — BASIC METABOLIC PANEL
Anion gap: 7 (ref 5–15)
BUN: 12 mg/dL (ref 6–20)
CO2: 28 mmol/L (ref 22–32)
Calcium: 9 mg/dL (ref 8.9–10.3)
Chloride: 106 mmol/L (ref 101–111)
Creatinine, Ser: 0.94 mg/dL (ref 0.61–1.24)
GFR calc Af Amer: >60 mL/min (ref >60)
GFR calc non Af Amer: >60 mL/min (ref >60)
Glucose, Bld: 112 mg/dL — ABNORMAL HIGH (ref 65–99)
Potassium: 4.6 mmol/L (ref 3.5–5.1)
Sodium: 141 mmol/L (ref 135–145)

## 2017-12-26 LAB — ABO/RH: ABO/RH(D): A POS

## 2017-12-26 NOTE — Progress Notes (Signed)
Final ekg done 12-26-17 in epic

## 2017-12-26 NOTE — Progress Notes (Signed)
EKG taken to anesthesia Dr. Annye Asa. No new orders,

## 2017-12-26 NOTE — Progress Notes (Signed)
CT chest 04-22-17 epic

## 2017-12-27 DIAGNOSIS — R8271 Bacteriuria: Secondary | ICD-10-CM | POA: Diagnosis not present

## 2017-12-27 DIAGNOSIS — N321 Vesicointestinal fistula: Secondary | ICD-10-CM | POA: Diagnosis not present

## 2017-12-27 DIAGNOSIS — C641 Malignant neoplasm of right kidney, except renal pelvis: Secondary | ICD-10-CM | POA: Diagnosis not present

## 2017-12-29 MED ORDER — BUPIVACAINE LIPOSOME 1.3 % IJ SUSP
20.0000 mL | INTRAMUSCULAR | Status: DC
  Filled 2017-12-29: qty 20

## 2017-12-30 ENCOUNTER — Encounter (HOSPITAL_COMMUNITY)
Admission: RE | Disposition: A | Discharge status: Home / Self Care | Payer: Self-pay | Source: Ambulatory Visit | Attending: General Surgery

## 2017-12-30 ENCOUNTER — Inpatient Hospital Stay (HOSPITAL_COMMUNITY)
Admission: RE | Admit: 2017-12-30 | Discharge: 2018-01-02 | DRG: 660 | Disposition: A | Discharge status: Home / Self Care | Payer: Medicare PPO | Source: Ambulatory Visit | Attending: General Surgery | Admitting: General Surgery

## 2017-12-30 ENCOUNTER — Inpatient Hospital Stay (HOSPITAL_COMMUNITY): Payer: Medicare PPO | Admitting: Anesthesiology

## 2017-12-30 ENCOUNTER — Encounter (HOSPITAL_COMMUNITY): Payer: Self-pay

## 2017-12-30 DIAGNOSIS — E785 Hyperlipidemia, unspecified: Secondary | ICD-10-CM | POA: Diagnosis present

## 2017-12-30 DIAGNOSIS — K5730 Diverticulosis of large intestine without perforation or abscess without bleeding: Secondary | ICD-10-CM | POA: Diagnosis not present

## 2017-12-30 DIAGNOSIS — N179 Acute kidney failure, unspecified: Secondary | ICD-10-CM | POA: Diagnosis present

## 2017-12-30 DIAGNOSIS — J449 Chronic obstructive pulmonary disease, unspecified: Secondary | ICD-10-CM | POA: Diagnosis not present

## 2017-12-30 DIAGNOSIS — Z89412 Acquired absence of left great toe: Secondary | ICD-10-CM | POA: Diagnosis not present

## 2017-12-30 DIAGNOSIS — N321 Vesicointestinal fistula: Secondary | ICD-10-CM | POA: Diagnosis present

## 2017-12-30 DIAGNOSIS — Z89411 Acquired absence of right great toe: Secondary | ICD-10-CM

## 2017-12-30 DIAGNOSIS — Z79899 Other long term (current) drug therapy: Secondary | ICD-10-CM | POA: Diagnosis not present

## 2017-12-30 DIAGNOSIS — N281 Cyst of kidney, acquired: Secondary | ICD-10-CM | POA: Diagnosis not present

## 2017-12-30 DIAGNOSIS — C641 Malignant neoplasm of right kidney, except renal pelvis: Secondary | ICD-10-CM | POA: Diagnosis not present

## 2017-12-30 DIAGNOSIS — Z87440 Personal history of urinary (tract) infections: Secondary | ICD-10-CM

## 2017-12-30 DIAGNOSIS — N2889 Other specified disorders of kidney and ureter: Secondary | ICD-10-CM | POA: Diagnosis not present

## 2017-12-30 DIAGNOSIS — K5732 Diverticulitis of large intestine without perforation or abscess without bleeding: Secondary | ICD-10-CM | POA: Diagnosis not present

## 2017-12-30 DIAGNOSIS — F17210 Nicotine dependence, cigarettes, uncomplicated: Secondary | ICD-10-CM | POA: Diagnosis not present

## 2017-12-30 DIAGNOSIS — I10 Essential (primary) hypertension: Secondary | ICD-10-CM | POA: Diagnosis not present

## 2017-12-30 DIAGNOSIS — G629 Polyneuropathy, unspecified: Secondary | ICD-10-CM | POA: Diagnosis not present

## 2017-12-30 DIAGNOSIS — Z6841 Body Mass Index (BMI) 40.0 and over, adult: Secondary | ICD-10-CM | POA: Diagnosis not present

## 2017-12-30 DIAGNOSIS — Z87442 Personal history of urinary calculi: Secondary | ICD-10-CM

## 2017-12-30 HISTORY — PX: ROBOTIC ASSITED PARTIAL NEPHRECTOMY: SHX6087

## 2017-12-30 LAB — TYPE AND SCREEN
ABO/RH(D): A POS
Antibody Screen: NEGATIVE

## 2017-12-30 SURGERY — COLECTOMY, PARTIAL, ROBOT-ASSISTED, LAPAROSCOPIC
Anesthesia: General | Laterality: Right

## 2017-12-30 MED ORDER — LIDOCAINE 2% (20 MG/ML) 5 ML SYRINGE
INTRAMUSCULAR | Status: AC
  Filled 2017-12-30: qty 5

## 2017-12-30 MED ORDER — PREGABALIN 75 MG PO CAPS
150.0000 mg | ORAL_CAPSULE | Freq: Three times a day (TID) | ORAL | Status: DC
  Administered 2017-12-30 – 2018-01-02 (×9): 150 mg via ORAL
  Filled 2017-12-30 (×9): qty 2

## 2017-12-30 MED ORDER — KETAMINE HCL 10 MG/ML IJ SOLN
INTRAMUSCULAR | Status: DC | PRN
  Administered 2017-12-30: 70 mg via INTRAVENOUS
  Administered 2017-12-30: 10 mg via INTRAVENOUS

## 2017-12-30 MED ORDER — KETAMINE HCL 10 MG/ML IJ SOLN
INTRAMUSCULAR | Status: AC
  Filled 2017-12-30: qty 1

## 2017-12-30 MED ORDER — MEPERIDINE HCL 50 MG/ML IJ SOLN: 6.2500 mg | INTRAMUSCULAR | Status: DC | PRN

## 2017-12-30 MED ORDER — DEXTROSE 5 % IV SOLN
2.0000 g | Freq: Two times a day (BID) | INTRAVENOUS | Status: AC
  Administered 2017-12-30: 2 g via INTRAVENOUS
  Filled 2017-12-30: qty 2

## 2017-12-30 MED ORDER — DIPHENHYDRAMINE HCL 12.5 MG/5ML PO ELIX
12.5000 mg | ORAL_SOLUTION | Freq: Four times a day (QID) | ORAL | Status: DC | PRN
Start: 2017-12-30 — End: 2018-01-02

## 2017-12-30 MED ORDER — DEXAMETHASONE SODIUM PHOSPHATE 10 MG/ML IJ SOLN
INTRAMUSCULAR | Status: DC | PRN
  Administered 2017-12-30: 10 mg via INTRAVENOUS

## 2017-12-30 MED ORDER — ROCURONIUM BROMIDE 10 MG/ML (PF) SYRINGE
PREFILLED_SYRINGE | INTRAVENOUS | Status: AC
  Filled 2017-12-30: qty 5

## 2017-12-30 MED ORDER — BUPIVACAINE-EPINEPHRINE (PF) 0.25% -1:200000 IJ SOLN
INTRAMUSCULAR | Status: DC | PRN
  Administered 2017-12-30: 50 mL

## 2017-12-30 MED ORDER — LIDOCAINE 2% (20 MG/ML) 5 ML SYRINGE
INTRAMUSCULAR | Status: DC | PRN
  Administered 2017-12-30: 1.5 mg/kg/h via INTRAVENOUS

## 2017-12-30 MED ORDER — FENTANYL CITRATE (PF) 100 MCG/2ML IJ SOLN
INTRAMUSCULAR | Status: AC
  Filled 2017-12-30: qty 2

## 2017-12-30 MED ORDER — DIPHENHYDRAMINE HCL 50 MG/ML IJ SOLN: 12.5000 mg | Freq: Four times a day (QID) | INTRAMUSCULAR | Status: DC | PRN

## 2017-12-30 MED ORDER — HYDROMORPHONE HCL 1 MG/ML IJ SOLN
0.5000 mg | INTRAMUSCULAR | Status: DC | PRN
  Administered 2017-12-30 – 2017-12-31 (×2): 0.5 mg via INTRAVENOUS
  Filled 2017-12-30 (×2): qty 0.5

## 2017-12-30 MED ORDER — LIDOCAINE 2% (20 MG/ML) 5 ML SYRINGE
INTRAMUSCULAR | Status: DC | PRN
  Administered 2017-12-30: 100 mg via INTRAVENOUS

## 2017-12-30 MED ORDER — ONDANSETRON HCL 4 MG/2ML IJ SOLN
4.0000 mg | Freq: Four times a day (QID) | INTRAMUSCULAR | Status: DC | PRN
  Administered 2017-12-31 – 2018-01-01 (×2): 4 mg via INTRAVENOUS
  Filled 2017-12-30 (×2): qty 2

## 2017-12-30 MED ORDER — LIDOCAINE 2% (20 MG/ML) 5 ML SYRINGE: INTRAMUSCULAR | Status: DC | PRN

## 2017-12-30 MED ORDER — ALVIMOPAN 12 MG PO CAPS
12.0000 mg | ORAL_CAPSULE | ORAL | Status: AC
  Administered 2017-12-30: 12 mg via ORAL
  Filled 2017-12-30: qty 1

## 2017-12-30 MED ORDER — INDOCYANINE GREEN 25 MG IV SOLR
INTRAVENOUS | Status: DC | PRN
  Administered 2017-12-30: 6.25 mg via INTRAVENOUS

## 2017-12-30 MED ORDER — CEFOTETAN DISODIUM-DEXTROSE 2-2.08 GM-%(50ML) IV SOLR
2.0000 g | INTRAVENOUS | Status: AC
  Administered 2017-12-30: 2 g via INTRAVENOUS
  Filled 2017-12-30: qty 50

## 2017-12-30 MED ORDER — HYDROMORPHONE HCL 1 MG/ML IJ SOLN
INTRAMUSCULAR | Status: AC
  Filled 2017-12-30: qty 2

## 2017-12-30 MED ORDER — SUFENTANIL CITRATE 50 MCG/ML IV SOLN
INTRAVENOUS | Status: AC
  Filled 2017-12-30: qty 1

## 2017-12-30 MED ORDER — SODIUM CHLORIDE 0.9 % IJ SOLN
INTRAMUSCULAR | Status: AC
  Filled 2017-12-30: qty 10

## 2017-12-30 MED ORDER — ENOXAPARIN SODIUM 40 MG/0.4ML ~~LOC~~ SOLN
40.0000 mg | SUBCUTANEOUS | Status: DC
  Administered 2017-12-31: 40 mg via SUBCUTANEOUS
  Filled 2017-12-30: qty 0.4

## 2017-12-30 MED ORDER — VARENICLINE TARTRATE 1 MG PO TABS
1.0000 mg | ORAL_TABLET | Freq: Two times a day (BID) | ORAL | Status: DC
  Administered 2017-12-30 – 2018-01-02 (×6): 1 mg via ORAL
  Filled 2017-12-30 (×6): qty 1

## 2017-12-30 MED ORDER — HYDROMORPHONE HCL 1 MG/ML IJ SOLN
0.2500 mg | INTRAMUSCULAR | Status: DC | PRN
  Administered 2017-12-30 (×3): 0.5 mg via INTRAVENOUS

## 2017-12-30 MED ORDER — SUGAMMADEX SODIUM 500 MG/5ML IV SOLN
INTRAVENOUS | Status: AC
  Filled 2017-12-30: qty 5

## 2017-12-30 MED ORDER — LIDOCAINE HCL 2 % IJ SOLN
INTRAMUSCULAR | Status: AC
  Filled 2017-12-30: qty 20

## 2017-12-30 MED ORDER — MOMETASONE FURO-FORMOTEROL FUM 200-5 MCG/ACT IN AERO
2.0000 | INHALATION_SPRAY | Freq: Two times a day (BID) | RESPIRATORY_TRACT | Status: DC
  Filled 2017-12-30: qty 8.8

## 2017-12-30 MED ORDER — ALVIMOPAN 12 MG PO CAPS
12.0000 mg | ORAL_CAPSULE | Freq: Two times a day (BID) | ORAL | Status: DC
  Administered 2017-12-31: 12 mg via ORAL
  Filled 2017-12-30: qty 1

## 2017-12-30 MED ORDER — PROPOFOL 10 MG/ML IV BOLUS
INTRAVENOUS | Status: DC | PRN
  Administered 2017-12-30: 250 mg via INTRAVENOUS

## 2017-12-30 MED ORDER — 0.9 % SODIUM CHLORIDE (POUR BTL) OPTIME
TOPICAL | Status: DC | PRN
  Administered 2017-12-30: 1000 mL

## 2017-12-30 MED ORDER — TRAMADOL HCL 50 MG PO TABS
50.0000 mg | ORAL_TABLET | Freq: Four times a day (QID) | ORAL | Status: DC | PRN
  Administered 2017-12-31 – 2018-01-02 (×3): 50 mg via ORAL
  Filled 2017-12-30 (×3): qty 1

## 2017-12-30 MED ORDER — MIDAZOLAM HCL 2 MG/2ML IJ SOLN
INTRAMUSCULAR | Status: DC | PRN
  Administered 2017-12-30: 2 mg via INTRAVENOUS

## 2017-12-30 MED ORDER — SUGAMMADEX SODIUM 500 MG/5ML IV SOLN
INTRAVENOUS | Status: DC | PRN
  Administered 2017-12-30: 500 mg via INTRAVENOUS

## 2017-12-30 MED ORDER — LACTATED RINGERS IV SOLN
INTRAVENOUS | Status: DC | PRN
  Administered 2017-12-30: 12:00:00 via INTRAVENOUS

## 2017-12-30 MED ORDER — DEXAMETHASONE SODIUM PHOSPHATE 10 MG/ML IJ SOLN
INTRAMUSCULAR | Status: AC
  Filled 2017-12-30: qty 1

## 2017-12-30 MED ORDER — COLLAGENASE 250 UNIT/GM EX OINT
1.0000 "application " | TOPICAL_OINTMENT | Freq: Every day | CUTANEOUS | Status: DC
  Administered 2017-12-30 – 2018-01-02 (×4): 1 via TOPICAL
  Filled 2017-12-30: qty 90

## 2017-12-30 MED ORDER — BUPIVACAINE-EPINEPHRINE 0.25% -1:200000 IJ SOLN
INTRAMUSCULAR | Status: AC
  Filled 2017-12-30: qty 1

## 2017-12-30 MED ORDER — BUPIVACAINE LIPOSOME 1.3 % IJ SUSP
INTRAMUSCULAR | Status: DC | PRN
  Administered 2017-12-30: 20 mL

## 2017-12-30 MED ORDER — LACTATED RINGERS IR SOLN
Status: DC | PRN
  Administered 2017-12-30: 1

## 2017-12-30 MED ORDER — PHENYLEPHRINE 40 MCG/ML (10ML) SYRINGE FOR IV PUSH (FOR BLOOD PRESSURE SUPPORT)
PREFILLED_SYRINGE | INTRAVENOUS | Status: AC
  Filled 2017-12-30: qty 10

## 2017-12-30 MED ORDER — KCL IN DEXTROSE-NACL 20-5-0.45 MEQ/L-%-% IV SOLN
INTRAVENOUS | Status: DC
  Administered 2017-12-30 – 2017-12-31 (×2): via INTRAVENOUS
  Filled 2017-12-30 (×5): qty 1000

## 2017-12-30 MED ORDER — TAMSULOSIN HCL 0.4 MG PO CAPS
0.4000 mg | ORAL_CAPSULE | Freq: Two times a day (BID) | ORAL | Status: DC
  Administered 2017-12-30 – 2018-01-02 (×6): 0.4 mg via ORAL
  Filled 2017-12-30 (×6): qty 1

## 2017-12-30 MED ORDER — FENTANYL CITRATE (PF) 100 MCG/2ML IJ SOLN
INTRAMUSCULAR | Status: DC | PRN
  Administered 2017-12-30 (×2): 50 ug via INTRAVENOUS

## 2017-12-30 MED ORDER — ACETAMINOPHEN 500 MG PO TABS
1000.0000 mg | ORAL_TABLET | Freq: Four times a day (QID) | ORAL | Status: DC
  Administered 2017-12-31 – 2018-01-02 (×10): 1000 mg via ORAL
  Filled 2017-12-30 (×10): qty 2

## 2017-12-30 MED ORDER — ALBUTEROL SULFATE (2.5 MG/3ML) 0.083% IN NEBU: 3.0000 mL | INHALATION_SOLUTION | Freq: Four times a day (QID) | RESPIRATORY_TRACT | Status: DC | PRN

## 2017-12-30 MED ORDER — ROCURONIUM BROMIDE 10 MG/ML (PF) SYRINGE
PREFILLED_SYRINGE | INTRAVENOUS | Status: DC | PRN
  Administered 2017-12-30 (×3): 20 mg via INTRAVENOUS
  Administered 2017-12-30: 100 mg via INTRAVENOUS
  Administered 2017-12-30: 20 mg via INTRAVENOUS
  Administered 2017-12-30: 10 mg via INTRAVENOUS
  Administered 2017-12-30: 30 mg via INTRAVENOUS
  Administered 2017-12-30: 20 mg via INTRAVENOUS
  Administered 2017-12-30 (×3): 30 mg via INTRAVENOUS
  Administered 2017-12-30: 20 mg via INTRAVENOUS

## 2017-12-30 MED ORDER — SACCHAROMYCES BOULARDII 250 MG PO CAPS
250.0000 mg | ORAL_CAPSULE | Freq: Two times a day (BID) | ORAL | Status: DC
  Administered 2017-12-30 – 2018-01-02 (×6): 250 mg via ORAL
  Filled 2017-12-30 (×6): qty 1

## 2017-12-30 MED ORDER — ONDANSETRON HCL 4 MG/2ML IJ SOLN
INTRAMUSCULAR | Status: AC
  Filled 2017-12-30: qty 2

## 2017-12-30 MED ORDER — ONDANSETRON HCL 4 MG/2ML IJ SOLN
INTRAMUSCULAR | Status: DC | PRN
  Administered 2017-12-30: 4 mg via INTRAVENOUS

## 2017-12-30 MED ORDER — FOLIC ACID 1 MG PO TABS
1.0000 mg | ORAL_TABLET | Freq: Every day | ORAL | Status: DC
  Administered 2017-12-31 – 2018-01-02 (×3): 1 mg via ORAL
  Filled 2017-12-30 (×3): qty 1

## 2017-12-30 MED ORDER — SUFENTANIL CITRATE 50 MCG/ML IV SOLN
INTRAVENOUS | Status: DC | PRN
  Administered 2017-12-30 (×2): 10 ug via INTRAVENOUS
  Administered 2017-12-30: 20 ug via INTRAVENOUS
  Administered 2017-12-30 (×4): 10 ug via INTRAVENOUS

## 2017-12-30 MED ORDER — ALUM & MAG HYDROXIDE-SIMETH 200-200-20 MG/5ML PO SUSP
30.0000 mL | Freq: Four times a day (QID) | ORAL | Status: DC | PRN
  Administered 2017-12-31 – 2018-01-02 (×6): 30 mL via ORAL
  Filled 2017-12-30 (×5): qty 30

## 2017-12-30 MED ORDER — LACTATED RINGERS IV SOLN
INTRAVENOUS | Status: DC
  Administered 2017-12-30 (×2): via INTRAVENOUS

## 2017-12-30 MED ORDER — PROPOFOL 10 MG/ML IV BOLUS
INTRAVENOUS | Status: AC
  Filled 2017-12-30: qty 40

## 2017-12-30 MED ORDER — HEMOSTATIC AGENTS (NO CHARGE) OPTIME
TOPICAL | Status: DC | PRN
  Administered 2017-12-30: 1 via TOPICAL

## 2017-12-30 MED ORDER — SUCCINYLCHOLINE CHLORIDE 200 MG/10ML IV SOSY
PREFILLED_SYRINGE | INTRAVENOUS | Status: AC
  Filled 2017-12-30: qty 10

## 2017-12-30 MED ORDER — METOPROLOL TARTRATE 5 MG/5ML IV SOLN
INTRAVENOUS | Status: DC | PRN
  Administered 2017-12-30: 2 mg via INTRAVENOUS

## 2017-12-30 MED ORDER — ACETAMINOPHEN 500 MG PO TABS
1000.0000 mg | ORAL_TABLET | ORAL | Status: AC
  Administered 2017-12-30: 1000 mg via ORAL
  Filled 2017-12-30: qty 2

## 2017-12-30 MED ORDER — PHENYLEPHRINE 40 MCG/ML (10ML) SYRINGE FOR IV PUSH (FOR BLOOD PRESSURE SUPPORT)
PREFILLED_SYRINGE | INTRAVENOUS | Status: DC | PRN
  Administered 2017-12-30: 80 ug via INTRAVENOUS

## 2017-12-30 MED ORDER — PROMETHAZINE HCL 25 MG/ML IJ SOLN: 6.2500 mg | INTRAMUSCULAR | Status: DC | PRN

## 2017-12-30 MED ORDER — ONDANSETRON HCL 4 MG PO TABS: 4.0000 mg | ORAL_TABLET | Freq: Four times a day (QID) | ORAL | Status: DC | PRN

## 2017-12-30 MED ORDER — ATORVASTATIN CALCIUM 10 MG PO TABS
10.0000 mg | ORAL_TABLET | Freq: Every day | ORAL | Status: DC
  Administered 2017-12-31 – 2018-01-02 (×3): 10 mg via ORAL
  Filled 2017-12-30 (×3): qty 1

## 2017-12-30 SURGICAL SUPPLY — 134 items
APPLICATOR SURGIFLO ENDO (HEMOSTASIS) ×3 IMPLANT
BLADE EXTENDED COATED 6.5IN (ELECTRODE) IMPLANT
CANNULA REDUC XI 12-8 STAPL (CANNULA) ×1
CANNULA REDUCER 12-8 DVNC XI (CANNULA) ×2 IMPLANT
CELLS DAT CNTRL 66122 CELL SVR (MISCELLANEOUS) IMPLANT
CHLORAPREP W/TINT 26ML (MISCELLANEOUS) ×3 IMPLANT
CLIP SUT LAPRA TY ABSORB (SUTURE) ×6 IMPLANT
CLIP VESOLOCK LG 6/CT PURPLE (CLIP) ×3 IMPLANT
CLIP VESOLOCK MED 6/CT (CLIP) IMPLANT
CLIP VESOLOCK MED LG 6/CT (CLIP) ×15 IMPLANT
CLIP VESOLOCK XL 6/CT (CLIP) IMPLANT
COVER MAYO STAND STRL (DRAPES) ×6 IMPLANT
COVER SURGICAL LIGHT HANDLE (MISCELLANEOUS) ×6 IMPLANT
COVER TIP SHEARS 8 DVNC (MISCELLANEOUS) ×4 IMPLANT
COVER TIP SHEARS 8MM DA VINCI (MISCELLANEOUS) ×2
DECANTER SPIKE VIAL GLASS SM (MISCELLANEOUS) ×3 IMPLANT
DERMABOND ADVANCED (GAUZE/BANDAGES/DRESSINGS) ×1
DERMABOND ADVANCED .7 DNX12 (GAUZE/BANDAGES/DRESSINGS) ×2 IMPLANT
DRAIN CHANNEL 15F RND FF 3/16 (WOUND CARE) ×3 IMPLANT
DRAIN CHANNEL 19F RND (DRAIN) IMPLANT
DRAPE ARM DVNC X/XI (DISPOSABLE) ×16 IMPLANT
DRAPE COLUMN DVNC XI (DISPOSABLE) ×4 IMPLANT
DRAPE DA VINCI XI ARM (DISPOSABLE) ×8
DRAPE DA VINCI XI COLUMN (DISPOSABLE) ×2
DRAPE INCISE IOBAN 66X45 STRL (DRAPES) ×3 IMPLANT
DRAPE SHEET LG 3/4 BI-LAMINATE (DRAPES) ×3 IMPLANT
DRAPE SURG IRRIG POUCH 19X23 (DRAPES) ×3 IMPLANT
DRSG OPSITE POSTOP 4X10 (GAUZE/BANDAGES/DRESSINGS) IMPLANT
DRSG OPSITE POSTOP 4X6 (GAUZE/BANDAGES/DRESSINGS) IMPLANT
DRSG OPSITE POSTOP 4X8 (GAUZE/BANDAGES/DRESSINGS) IMPLANT
ELECT PENCIL ROCKER SW 15FT (MISCELLANEOUS) ×9 IMPLANT
ELECT REM PT RETURN 15FT ADLT (MISCELLANEOUS) ×6 IMPLANT
ENDOLOOP SUT PDS II  0 18 (SUTURE)
ENDOLOOP SUT PDS II 0 18 (SUTURE) IMPLANT
EVACUATOR SILICONE 100CC (DRAIN) ×3 IMPLANT
FLOSEAL 10ML (HEMOSTASIS) ×3 IMPLANT
GAUZE SPONGE 4X4 12PLY STRL (GAUZE/BANDAGES/DRESSINGS) IMPLANT
GLOVE BIO SURGEON STRL SZ 6.5 (GLOVE) ×36 IMPLANT
GLOVE BIOGEL M STRL SZ7.5 (GLOVE) ×6 IMPLANT
GLOVE BIOGEL PI IND STRL 7.0 (GLOVE) ×6 IMPLANT
GLOVE BIOGEL PI INDICATOR 7.0 (GLOVE) ×3
GOWN STRL REUS W/TWL 2XL LVL3 (GOWN DISPOSABLE) ×18 IMPLANT
GOWN STRL REUS W/TWL LRG LVL3 (GOWN DISPOSABLE) ×6 IMPLANT
GOWN STRL REUS W/TWL XL LVL3 (GOWN DISPOSABLE) ×12 IMPLANT
GRASPER ENDOPATH ANVIL 10MM (MISCELLANEOUS) IMPLANT
GRASPER SUT TROCAR 14GX15 (MISCELLANEOUS) ×3 IMPLANT
HEMOSTAT SURGICEL 4X8 (HEMOSTASIS) ×3 IMPLANT
HOLDER FOLEY CATH W/STRAP (MISCELLANEOUS) ×3 IMPLANT
IRRIG SUCT STRYKERFLOW 2 WTIP (MISCELLANEOUS) ×3
IRRIGATION SUCT STRKRFLW 2 WTP (MISCELLANEOUS) ×2 IMPLANT
IRRIGATOR SUCT 8 DISP DVNC XI (IRRIGATION / IRRIGATOR) ×2 IMPLANT
IRRIGATOR SUCTION 8MM XI DISP (IRRIGATION / IRRIGATOR) ×1
KIT BASIN OR (CUSTOM PROCEDURE TRAY) ×3 IMPLANT
KIT PROCEDURE DA VINCI SI (MISCELLANEOUS) ×1
KIT PROCEDURE DVNC SI (MISCELLANEOUS) ×2 IMPLANT
LOOP VESSEL MAXI BLUE (MISCELLANEOUS) ×3 IMPLANT
MARKER SKIN DUAL TIP RULER LAB (MISCELLANEOUS) ×3 IMPLANT
NEEDLE INSUFFLATION 14GA 120MM (NEEDLE) ×6 IMPLANT
NS IRRIG 1000ML POUR BTL (IV SOLUTION) ×3 IMPLANT
PACK CARDIOVASCULAR III (CUSTOM PROCEDURE TRAY) ×3 IMPLANT
PACK COLON (CUSTOM PROCEDURE TRAY) ×3 IMPLANT
PORT ACCESS TROCAR AIRSEAL 12 (TROCAR) ×2 IMPLANT
PORT ACCESS TROCAR AIRSEAL 5M (TROCAR) ×1
PORT LAP GEL ALEXIS MED 5-9CM (MISCELLANEOUS) IMPLANT
POSITIONER SURGICAL ARM (MISCELLANEOUS) ×6 IMPLANT
POUCH SPECIMEN RETRIEVAL 10MM (ENDOMECHANICALS) ×3 IMPLANT
RELOAD STAPLER WHITE 60MM (STAPLE) IMPLANT
RTRCTR WOUND ALEXIS 18CM MED (MISCELLANEOUS)
SCISSORS METZENBAUM CVD 33 (INSTRUMENTS) ×3 IMPLANT
SEAL CANN UNIV 5-8 DVNC XI (MISCELLANEOUS) ×14 IMPLANT
SEAL XI 5MM-8MM UNIVERSAL (MISCELLANEOUS) ×7
SEALER VESSEL DA VINCI XI (MISCELLANEOUS) ×1
SEALER VESSEL EXT DVNC XI (MISCELLANEOUS) ×2 IMPLANT
SET TRI-LUMEN FLTR TB AIRSEAL (TUBING) ×3 IMPLANT
SLEEVE ADV FIXATION 5X100MM (TROCAR) IMPLANT
SOLUTION ELECTROLUBE (MISCELLANEOUS) ×6 IMPLANT
SPONGE LAP 4X18 X RAY DECT (DISPOSABLE) ×3 IMPLANT
STAPLE ECHEON FLEX 60 POW ENDO (STAPLE) IMPLANT
STAPLER 45 BLU RELOAD XI (STAPLE) IMPLANT
STAPLER 45 BLUE RELOAD XI (STAPLE)
STAPLER 45 GREEN RELOAD XI (STAPLE) ×2
STAPLER 45 GRN RELOAD XI (STAPLE) ×4 IMPLANT
STAPLER CANNULA SEAL DVNC XI (STAPLE) ×2 IMPLANT
STAPLER CANNULA SEAL XI (STAPLE) ×1
STAPLER CIRC CVD 29MM 37CM (STAPLE) ×3 IMPLANT
STAPLER RELOAD WHITE 60MM (STAPLE)
STAPLER SHEATH (SHEATH) ×1
STAPLER SHEATH ENDOWRIST DVNC (SHEATH) ×2 IMPLANT
STAPLER VISISTAT 35W (STAPLE) IMPLANT
SURGIFLO W/THROMBIN 8M KIT (HEMOSTASIS) ×3 IMPLANT
SUT ETHILON 2 0 PS N (SUTURE) ×3 IMPLANT
SUT ETHILON 3 0 PS 1 (SUTURE) ×3 IMPLANT
SUT MNCRL AB 4-0 PS2 18 (SUTURE) ×9 IMPLANT
SUT NOVA NAB GS-21 0 18 T12 DT (SUTURE) ×6 IMPLANT
SUT NOVA NAB GS-21 1 T12 (SUTURE) ×6 IMPLANT
SUT PDS AB 1 CT1 27 (SUTURE) ×6 IMPLANT
SUT PDS AB 1 CTX 36 (SUTURE) IMPLANT
SUT PDS AB 1 TP1 96 (SUTURE) IMPLANT
SUT PROLENE 2 0 KS (SUTURE) ×3 IMPLANT
SUT SILK 2 0 (SUTURE) ×1
SUT SILK 2 0 SH CR/8 (SUTURE) ×3 IMPLANT
SUT SILK 2-0 18XBRD TIE 12 (SUTURE) ×2 IMPLANT
SUT SILK 3 0 (SUTURE) ×1
SUT SILK 3 0 SH CR/8 (SUTURE) ×3 IMPLANT
SUT SILK 3-0 18XBRD TIE 12 (SUTURE) ×2 IMPLANT
SUT V-LOC BARB 180 2/0GR6 GS22 (SUTURE) ×3
SUT VIC AB 0 CT1 27 (SUTURE) ×4
SUT VIC AB 0 CT1 27XBRD ANTBC (SUTURE) ×8 IMPLANT
SUT VIC AB 0 UR5 27 (SUTURE) ×3 IMPLANT
SUT VIC AB 2-0 SH 18 (SUTURE) ×6 IMPLANT
SUT VIC AB 2-0 SH 27 (SUTURE) ×4
SUT VIC AB 2-0 SH 27X BRD (SUTURE) ×8 IMPLANT
SUT VIC AB 3-0 SH 18 (SUTURE) ×3 IMPLANT
SUT VIC AB 4-0 PS2 18 (SUTURE) ×6 IMPLANT
SUT VIC AB 4-0 PS2 27 (SUTURE) ×6 IMPLANT
SUT VICRYL 0 UR6 27IN ABS (SUTURE) ×6 IMPLANT
SUT VLOC BARB 180 ABS3/0GR12 (SUTURE) ×6
SUTURE V-LC BRB 180 2/0GR6GS22 (SUTURE) ×2 IMPLANT
SUTURE VLOC BRB 180 ABS3/0GR12 (SUTURE) ×4 IMPLANT
SYR 10ML ECCENTRIC (SYRINGE) ×3 IMPLANT
SYRINGE IRR TOOMEY STRL 70CC (SYRINGE) ×3 IMPLANT
SYS LAPSCP GELPORT 120MM (MISCELLANEOUS)
SYSTEM LAPSCP GELPORT 120MM (MISCELLANEOUS) IMPLANT
TOWEL OR 17X26 10 PK STRL BLUE (TOWEL DISPOSABLE) ×9 IMPLANT
TOWEL OR NON WOVEN STRL DISP B (DISPOSABLE) ×6 IMPLANT
TRAY FOLEY W/METER SILVER 16FR (SET/KITS/TRAYS/PACK) ×6 IMPLANT
TRAY LAPAROSCOPIC (CUSTOM PROCEDURE TRAY) ×3 IMPLANT
TROCAR ADV FIXATION 5X100MM (TROCAR) ×3 IMPLANT
TROCAR BLADELESS OPT 5 100 (ENDOMECHANICALS) IMPLANT
TROCAR XCEL 12X100 BLDLESS (ENDOMECHANICALS) ×3 IMPLANT
TROCAR XCEL UNIV SLVE 11M 100M (ENDOMECHANICALS) ×3 IMPLANT
TUBING CONNECTING 10 (TUBING) ×6 IMPLANT
TUBING INSUFFLATION 10FT LAP (TUBING) ×3 IMPLANT
WATER STERILE IRR 1000ML POUR (IV SOLUTION) ×6 IMPLANT

## 2017-12-30 NOTE — Brief Op Note (Signed)
12/30/2017  7:09 PM  PATIENT:  [name].  62 y.o. male  PRE-OPERATIVE DIAGNOSIS:  colovesical fistula right renal mass  POST-OPERATIVE DIAGNOSIS:  colovesical fistula right renal mass  PROCEDURE:  Procedure(s): XI ROBOT  PARTIAL COLECTOMY ERAS PATHWAY (N/A) XI ROBOTIC ASSITED RIGHT PARTIAL NEPHRECTOMY (Right)  SURGEON:  Surgeon(s) and Role: Panel 1:    Leighton Ruff, MD - Primary Panel 2:    * Alexis Frock, MD - Primary   ASSISTANTS: none   ANESTHESIA:   local and general  EBL:  150 mL   BLOOD ADMINISTERED:none  DRAINS: JP to bulb   LOCAL MEDICATIONS USED:  MARCAINE     SPECIMEN:  Source of Specimen:  1 - right partial nephrectomy  DISPOSITION OF SPECIMEN:  PATHOLOGY  COUNTS:  YES  TOURNIQUET:  * No tourniquets in log *  DICTATION: .Other Dictation: Dictation Number  [id number]  PLAN OF CARE: Admit to inpatient   PATIENT DISPOSITION:  PACU - hemodynamically stable.   Delay start of Pharmacological VTE agent (>24hrs) due to surgical blood loss or risk of bleeding: yes

## 2017-12-30 NOTE — Anesthesia Postprocedure Evaluation (Signed)
Anesthesia Post Note  Patient: [name].  Procedure(s) Performed: XI [place] (N/A ) XI ROBOTIC ASSITED RIGHT PARTIAL NEPHRECTOMY (Right )     Patient location during evaluation: PACU Anesthesia Type: General Level of consciousness: sedated and patient cooperative Pain management: pain level controlled Vital Signs Assessment: post-procedure vital signs reviewed and stable Respiratory status: spontaneous breathing Cardiovascular status: stable Anesthetic complications: no    Last Vitals:  Vitals:   12/30/17 2110 12/30/17 2210  BP: 134/71 123/66  Pulse: 78 78  Resp: 13 18  Temp: 36.4 C 36.4 C  SpO2: 96% 97%    Last Pain:  Vitals:   12/30/17 2210  TempSrc: Oral  PainSc:                  Nolon Nations

## 2017-12-30 NOTE — H&P (Signed)
Cory Ogas. is an 62 y.o. male.    Chief Complaint: Pre-OP RIGHT Robotic partial Nephrectomy + Colovesical Fistula Repair  HPI:   1 - Solid Right Renal Mass - 3.6cm Rt mid lateral mass by CT and dedicated MRI 2018 on eval inguinal adenopathy that was proven reactive. Mass is 3.6cm, about 50% endophytic, and does not appear to involve hilar fat. 2 artery / 1 vein right renovascular anatomy. Cr 1.05.   2 - Non-Complex Rt Renal Cyst - 2cm parapelvic renal cyst suprior to mass above by CT and MRI 2018. Unlike above mass, this is NOT solid or enhancing. Bos 2 by MRI.   3 - Colovesical Fistula - pneumaturia, recurrent UTI x several 2018. CT confirms CV fistula between mid sigmoid and anterior bladder dome. This appear well away from ureters / trigone in imaging. He has seen Leighton Ruff MD with [facility] who agrees local sigmoid resection warranted. Known h/o diverticulosis and uup to date on prior colonscopies and repeat 08/2018 by Carlean Purl confirms no significant GI cancer (few tubular adenoma only w/o sig dysplasia). Cysto 10/2017 with persistant posterior wall fistula (likely 2-3 openings).   4 - Lower Urinary Tract Symptoms - on tamsulosin at baseline with good control of mild obstructive symptoms.   PMH sig for obesity, diverticulosis, PAD (chronic RLE foot wound managed at wound center, MRI 2018 w/o osteo). NO ischemeic CV disease / blood thinnes. NO prior chest / abd surgery. His PCP is [name] MD.   Today "[name]" is seen to proceed with RIGHT robotic partial nephrectomy + CV fistula repair in combined surgery with Dr. Marcello Moores with general surgery. No interval fevers. He has been on Cipro pre-op according to most recetn CX's.   Past Medical History:  Diagnosis Date  . Arthritis   . Chronic kidney disease    mass r kidney   . COPD (chronic obstructive pulmonary disease) ([place])   . History of kidney stones   . Hx of adenomatous colonic polyps 06/09/2015  .  Hyperlipidemia   . Hypertension    has previously been on medication but then taken off  . Neuromuscular disorder ([facility])    neuropathy lower legs and feet  . Neuropathy    in both feet  . Pneumonia 2011    Past Surgical History:  Procedure Laterality Date  . AMPUTATION Bilateral 06/05/2013   Procedure: RIGHT GREAT TOE AMPUTATION/LEFT GREAT TOE DEBRIDEMENT;  Surgeon: Wylene Simmer, MD;  Location: [facility];  Service: Orthopedics;  Laterality: Bilateral;  . COLON SURGERY     robotic partial colectomy Dr. Marcello Moores 12-30-17  . COLONOSCOPY     x 2 last date 09/21/2017 bladder attached to colon eval  . COLONOSCOPY WITH PROPOFOL N/A 09/21/2017   Procedure: COLONOSCOPY WITH PROPOFOL;  Surgeon: [name], MD;  Location: [facility];  Service: Endoscopy;  Laterality: N/A;  . MOUTH SURGERY    . PARTIAL NEPHRECTOMY     right Dr. Leighton Ruff [birth year]  . TOE AMPUTATION Right 06/05/2013   RIGHT GREAT TOE PARTIAL AMPUTATION    . TOOTH EXTRACTION  2011   multiple teeth extracted    Family History  Problem Relation Age of Onset  . Heart disease Mother   . Esophageal cancer Mother   . Diabetes Father   . Pancreatic cancer Father   . Breast cancer Sister   . Colon cancer Neg Hx   . Rectal cancer Neg Hx   . Stomach cancer Neg Hx   .  Colon polyps Neg Hx    Social History:  reports that he has been smoking cigarettes.  He started smoking about 44 years ago. He has a 2.00 pack-year smoking history. he has never used smokeless tobacco. He reports that he does not drink alcohol or use drugs.  Allergies: No Known Allergies  No medications prior to admission.    No results found for this or any previous visit (from the past 48 hour(s)). No results found.  Review of Systems  Constitutional: Negative.  Negative for chills and fever.  HENT: Negative.   Eyes: Negative.   Respiratory: Negative.   Cardiovascular: Negative.   Gastrointestinal: Negative.   Genitourinary: Negative.   Musculoskeletal:  Negative.   Skin: Negative.   Neurological: Negative.   Endo/Heme/Allergies: Negative.   Psychiatric/Behavioral: Negative.     There were no vitals taken for this visit. Physical Exam  Constitutional: He appears well-developed.  HENT:  Head: Normocephalic.  Eyes: Pupils are equal, round, and reactive to light.  Neck: Normal range of motion.  Cardiovascular: Normal rate.  Respiratory: Effort normal.  GI: Soft.  Genitourinary:  Genitourinary Comments: No CVAT  Musculoskeletal: Normal range of motion.  Neurological: He is alert.  Skin: Skin is warm.  Psychiatric: He has a normal mood and affect.     Assessment/Plan  Proceed as planned with RIGHT robotic partial nephrectomy + CV fistula repair. His CV fistula appears to be at dome / well away from trigone and ureters and I do not feel peri-op stenting required. Risks, benefits, alternatives, expected peri-op course discussed previously and reiterated today.   Alexis Frock, MD 12/30/2017, 6:54 AM

## 2017-12-30 NOTE — Anesthesia Preprocedure Evaluation (Addendum)
Anesthesia Evaluation  Patient identified by MRN, date of birth, ID band Patient awake    Reviewed: Allergy & Precautions, NPO status , Patient's Chart, lab work & pertinent test results  Airway Mallampati: II  TM Distance: >3 FB Neck ROM: Full    Dental no notable dental hx.    Pulmonary pneumonia, COPD, Current Smoker,    Pulmonary exam normal breath sounds clear to auscultation       Cardiovascular hypertension, + Peripheral Vascular Disease  Normal cardiovascular exam Rhythm:Regular Rate:Normal     Neuro/Psych  Neuromuscular disease negative neurological ROS  negative psych ROS   GI/Hepatic negative GI ROS, Neg liver ROS,   Endo/Other  Morbid obesity  Renal/GU Renal diseasenegative Renal ROS     Musculoskeletal negative musculoskeletal ROS (+) Arthritis ,   Abdominal   Peds  Hematology negative hematology ROS (+) anemia ,   Anesthesia Other Findings   Reproductive/Obstetrics negative OB ROS                           Anesthesia Physical Anesthesia Plan  ASA: III  Anesthesia Plan: General   Post-op Pain Management:    Induction: Intravenous  PONV Risk Score and Plan: 3 and Ondansetron, Dexamethasone, Midazolam and Treatment may vary due to age or medical condition  Airway Management Planned: Oral ETT  Additional Equipment:   Intra-op Plan:   Post-operative Plan: Possible Post-op intubation/ventilation  Informed Consent: I have reviewed the patients History and Physical, chart, labs and discussed the procedure including the risks, benefits and alternatives for the proposed anesthesia with the patient or authorized representative who has indicated his/her understanding and acceptance.   Dental advisory given  Plan Discussed with: CRNA  Anesthesia Plan Comments:         Anesthesia Quick Evaluation

## 2017-12-30 NOTE — Anesthesia Procedure Notes (Signed)
Procedure Name: Intubation Date/Time: 12/30/2017 1:01 PM Performed by: Sharlette Dense, CRNA Patient Re-evaluated:Patient Re-evaluated prior to induction Oxygen Delivery Method: Circle system utilized Preoxygenation: Pre-oxygenation with 100% oxygen Induction Type: IV induction Ventilation: Mask ventilation without difficulty and Oral airway inserted - appropriate to patient size Laryngoscope Size: Miller and 3 Grade View: Grade I Tube type: Oral Tube size: 8.0 mm Number of attempts: 1 Airway Equipment and Method: Stylet Placement Confirmation: ETT inserted through vocal cords under direct vision,  positive ETCO2 and breath sounds checked- equal and bilateral Secured at: 22 cm Tube secured with: Tape Dental Injury: Teeth and Oropharynx as per pre-operative assessment

## 2017-12-30 NOTE — Transfer of Care (Signed)
Immediate Anesthesia Transfer of Care Note  Patient: [name].  Procedure(s) Performed: XI [place] (N/A ) XI ROBOTIC ASSITED RIGHT PARTIAL NEPHRECTOMY (Right )  Patient Location: PACU  Anesthesia Type:General  Level of Consciousness: awake, alert , oriented and patient cooperative  Airway & Oxygen Therapy: Patient Spontanous Breathing and Patient connected to face mask oxygen  Post-op Assessment: Report given to RN and Post -op Vital signs reviewed and stable  Post vital signs: Reviewed and stable  Last Vitals:  Vitals:   12/30/17 1029  BP: (!) 148/93  Pulse: 96  Resp: 20  Temp: 37 C  SpO2: 97%    Last Pain:  Vitals:   12/30/17 1050  TempSrc:   PainSc: 3       Patients Stated Pain Goal: 2 (86/57/84 6962)  Complications: No apparent anesthesia complications

## 2017-12-30 NOTE — H&P (Signed)
62yo M who presented to the office with pneumaturia x1 yr. He also states he has had several UTI's during this time. He underwent an [name] that showed inguinal lymphadenopathy, which prompted a CT scan. This showed a right sided renal mass and inguinal lymphadenopathy as well as a possible colovesical fistula. His last colonoscopy was July 2016. He is due again for repeat colonoscopy in 2019. He has been scoped by Dr. Carlean Purl and this was neg. He has also been referred to Dr. Tresa Moore for evaluation of robotic resection of his kidney mass. The patient also has some chronic venous stasis ulcers and neuropathy in his lower extremities. He is followed at the wound clinic for this. He has also seen a vascular surgeon who states that the toes on his right foot are probably not salvageable. This disease appears to be related to venous stasis as well as atherosclerosis of small vessels.   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  Diagnostic Studies History   Colonoscopy  1-5 years ago  Allergies  No Known Drug Allergies 08/15/2017 Allergies Reconciled   Medication History   Tamsulosin HCl (0.4MG  Capsule, Oral) Active. (1 capsule daily) Symbicort (160-4.5MCG/ACT Aerosol, Inhalation) Active. Atorvastatin Calcium (10MG  Tablet, Oral) Active. Chantix (0.5MG  Tablet, Oral) Active. Lyrica (100MG  Capsule, Oral) Active. Medications Reconciled  Social History  Alcohol use  Remotely quit alcohol use. Caffeine use  Coffee, Tea. No drug use  Tobacco use  Current some day smoker.  Family History  Breast Cancer  Sister. Cancer  Father, Mother. Diabetes Mellitus  Father.  Other Problems  Arthritis  Back Pain  Chronic Obstructive Lung Disease  Diverticulosis     Review of Systems General Present- Fatigue and Weight Gain. Not Present- Appetite Loss, Chills, Fever, Night Sweats and Weight Loss. Skin Present-  Non-Healing Wounds and Ulcer. Not Present- Change in Wart/Mole, Dryness, Hives, Jaundice, New Lesions and Rash. HEENT Present- Ringing in the Ears. Not Present- Earache, Hearing Loss, Hoarseness, Nose Bleed, Oral Ulcers, Seasonal Allergies, Sinus Pain, Sore Throat, Visual Disturbances, Wears glasses/contact lenses and Yellow Eyes. Gastrointestinal Present- Gets full quickly at meals. Not Present- Abdominal Pain, Bloating, Bloody Stool, Change in Bowel Habits, Chronic diarrhea, Constipation, Difficulty Swallowing, Excessive gas, Hemorrhoids, Indigestion, Nausea, Rectal Pain and Vomiting. Male Genitourinary Present- Nocturia. Not Present- Blood in Urine, Change in Urinary Stream, Frequency, Impotence, Painful Urination, Urgency and Urine Leakage. Musculoskeletal Present- Back Pain, Joint Pain and Swelling of Extremities. Not Present- Joint Stiffness, Muscle Pain and Muscle Weakness. Neurological Present- Numbness, Tingling and Trouble walking. Not Present- Decreased Memory, Fainting, Headaches, Seizures, Tremor and Weakness. Hematology Present- Easy Bruising. Not Present- Blood Thinners, Excessive bleeding, Gland problems, HIV and Persistent Infections.  BP (!) 148/93   Pulse 96   Temp 98.6 F (37 C) (Oral)   Resp 20   Ht 6\' 8"  (2.032 m)   Wt (!) 174.6 kg (385 lb)   SpO2 97%   BMI 42.29 kg/m      Physical Exam Leighton Ruff MD; 05/28/5283 9:34 AM) General Mental Status-Alert. General Appearance-Not in acute distress. Build & Nutrition-Well nourished. Posture-Normal posture. Gait-Normal.  Head and Neck Head-normocephalic, atraumatic with no lesions or palpable masses. Trachea-midline.  Chest and Lung Exam Chest and lung exam reveals -on auscultation, normal breath sounds, no adventitious sounds and normal vocal resonance.  Cardiovascular Cardiovascular examination reveals -normal heart sounds, regular rate and rhythm with no  murmurs.  Abdomen Inspection Inspection of the abdomen reveals -  No Hernias. Palpation/Percussion Palpation and Percussion of the abdomen reveal - Soft, Non Tender, No Rigidity (guarding), No hepatosplenomegaly and No Palpable abdominal masses.  Neurologic Neurologic evaluation reveals -alert and oriented x 3 with no impairment of recent or remote memory, normal attention span and ability to concentrate, normal sensation and normal coordination.  Musculoskeletal Normal Exam - Bilateral-Upper Extremity Strength Normal and Lower Extremity Strength Normal.    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 His CT scan shows a slight communication between the bladder and the colon. Colonoscopy was completed and showed no other pathology.  He also has a right renal mass and he has seen Dr Tresa Moore, who recommended partial nephrectomy.   I have recommended segmental colectomy.  I think this could be done as a combined procedure with his renal surgery.  The surgery and anatomy were described to the patient as well as the risks of surgery and the possible complications.  These include: Bleeding, deep abdominal infections and possible wound complications such as hernia and infection, damage to adjacent structures, leak of surgical connections, which can lead to other surgeries and possibly an ostomy, possible need for other procedures, such as abscess drains in radiology, possible prolonged hospital stay, possible diarrhea from removal of part of the colon, possible constipation from narcotics, prolonged fatigue/weakness or appetite loss, possible early recurrence of of disease, possible complications of their medical problems such as heart disease or arrhythmias or lung problems, death (less than 1%). I believe the patient understands and wishes to proceed with the  surgery.

## 2017-12-30 NOTE — Op Note (Addendum)
12/30/2017  7:40 PM  PATIENT:  [name].  62 y.o. male  Patient Care Team: [name], MD as PCP - General (Internal Medicine) Gregor Hams, MD as Attending Physician (Emergency Medicine)  PRE-OPERATIVE DIAGNOSIS:  colovesical fistula right renal mass  POST-OPERATIVE DIAGNOSIS:  colovesical fistula right renal mass  PROCEDURE:  Procedure(s): XI ROBOT SIGMOIDECTOMY XI ROBOTIC ASSITED RIGHT PARTIAL NEPHRECTOMY INTRAOPERATIVE ASSESSMENT OF PERFUSION  :  Surgeon(s): Leighton Ruff, MD Alexis Frock, MD  ASSISTANT: Dr Tresa Moore   ANESTHESIA:   local and general  EBL:  221ml  Delay start of Pharmacological VTE agent (>24hrs) due to surgical blood loss or risk of bleeding:  no  DRAINS: (32F) Jackson-Pratt drain(s) with closed bulb suction in the RUQ   SPECIMEN:  Source of Specimen:  R renal lesion, sigmoid colon  DISPOSITION OF SPECIMEN:  PATHOLOGY  COUNTS:  YES  PLAN OF CARE: Admit to inpatient   PATIENT DISPOSITION:  PACU - hemodynamically stable.  INDICATION:    62 y.o. M with colovesical fistula and R renal mass.  I recommended segmental resection and Dr Tresa Moore recommended partial nephrectomy:  The anatomy & physiology of the digestive tract was discussed.  The pathophysiology was discussed.  Natural history risks without surgery was discussed.   I worked to give an overview of the disease and the frequent need to have multispecialty involvement.  I feel the risks of no intervention will lead to serious problems that outweigh the operative risks; therefore, I recommended a partial colectomy to remove the pathology.  Laparoscopic & open techniques were discussed.   Risks such as bleeding, infection, abscess, leak, reoperation, possible ostomy, hernia, heart attack, death, and other risks were discussed.  I noted a good likelihood this will help address the problem.   Goals of post-operative recovery were discussed as well.    The patient expressed  understanding & wished to proceed with surgery.  OR FINDINGS:   Patient had anterior pelvic adhesions and a punctate fistula site to the dome of the bladder.   DESCRIPTION:   Informed consent was confirmed.  The patient underwent general anaesthesia without difficulty.  The patient was positioned appropriately.  VTE prevention in place.  The patient's abdomen was clipped, prepped, & draped in a sterile fashion.  Surgical timeout confirmed our plan.  Dr Tresa Moore proceeded with his portion of the case.  Once he completed this, the patient was placed in lithotomy position and reprepped and draped.  I used the existing trochar site to create pneumoperitoneum.  I then placed additional ports in the RLQ and LUQ.    I laparoscopically reflected the greater omentum and the upper abdomen the small bowel in the upper abdomen. The patient was appropriately positioned and the robot was docked to the patient's left side.  Instruments were placed under direct visualization.    I mobilized the sigmoid colon off of the pelvic sidewall. There were significant anterior pelvic adhesions.   I scored the base of peritoneum of the right side of the mesentery of the left colon from the ligament of Treitz to the peritoneal reflection of the mid rectum.  I elevated the sigmoid mesentery and enetered into the retro-mesenteric plane. We were able to identify the left ureter and gonadal vessels. We kept those posterior within the retroperitoneum and elevated the left colon mesentery off that. I did isolated IMA pedicle but did not ligate it yet.  I continued distally and got into the avascular plane posterior to the mesorectum.  This allowed me to help mobilize the rectum as well by freeing the mesorectum off the sacrum.  I mobilized the peritoneal coverings towards the peritoneal reflection on both the right and left sides of the rectum.  I could see the right and left ureters and stayed away from them.    I skeletonized the  inferior mesenteric artery pedicle.  After confirming the left ureter was out of the way, I went ahead and ligated the inferior mesenteric artery pedicle with bipolar robotic vessel sealer  above its takeoff from the aorta.  We ensured hemostasis. I skeletonized the mesorectum at the junction at the proximal rectum using blunt dissection & bipolar robotic vessel sealer.  I mobilized the left colon in a lateral to medial fashion off the line of Toldt up towards the splenic flexure to ensure good mobilization of the left colon to reach into the pelvis.  I identified the rectosigmoid junction and cleared the fat around this using the vessel sealer.  This was then transected with 2 green stapler loads.  The mesentery was then divided to the level of the normal colon also using the vessel sealer.  I confirmed perfusion by having the anesthesia team inject IV firefly.  Perfusion was seen up to a level ~2 cm proximal to my original transection spot.  I cleared the fat away from that area using the vessel sealer.  I then undocked the robot.  I made a Phannensteil incision and placed an [place]ons wound protector.  The specimen was brought out through the wound protector.  A purse string device was placed on the proximal colon and it was divided.  There was good mucosal bleeding at the edges.  The specimen was sent to pathology.  A prolene pursestring was placed.  A 29 mm EEA anvil was then inserted and the pursestring was tied around this.  It was placed back into the abdomen and the cap was placed on the [place].  An anastomosis was created under lap visualization.  There was no tension.  There was no leak when tested with insufflation.  We tested the bladder with irrigation and no leak was noted.  We then closed the 12 mm port site laparoscopically with a 0 VIcryl suture.  The renal lesion was removed from the body. We then desufflated the abdomen and removed the wound protector and ports.    Clean gowns, gloves,  instruments and drapes were placed.  The extraction site peritoneum was closed using a 0 vicryl.  The subcutaneous tissue was closed with 2-0 Vicryl and the skin and port sites were closed with 4-0 Monocryl.  Sterile dressings were applied.  The patient was awakened from anesthesia and sent to the PACU in stable condition.    An MD assistant was necessary for tissue manipulation, retraction and positioning due to the complexity of the case and hospital policies

## 2017-12-31 ENCOUNTER — Encounter (HOSPITAL_COMMUNITY): Payer: Self-pay | Admitting: General Surgery

## 2017-12-31 LAB — BASIC METABOLIC PANEL
Anion gap: 10 (ref 5–15)
BUN: 16 mg/dL (ref 6–20)
CO2: 24 mmol/L (ref 22–32)
Calcium: 8.5 mg/dL — ABNORMAL LOW (ref 8.9–10.3)
Chloride: 100 mmol/L — ABNORMAL LOW (ref 101–111)
Creatinine, Ser: 1.4 mg/dL — ABNORMAL HIGH (ref 0.61–1.24)
GFR calc Af Amer: >60 mL/min (ref >60)
GFR calc non Af Amer: 53 mL/min — ABNORMAL LOW (ref >60)
Glucose, Bld: 179 mg/dL — ABNORMAL HIGH (ref 65–99)
Potassium: 4.6 mmol/L (ref 3.5–5.1)
Sodium: 134 mmol/L — ABNORMAL LOW (ref 135–145)

## 2017-12-31 LAB — CREATININE, FLUID (PLEURAL, PERITONEAL, JP DRAINAGE): Creat, Fluid: 1.5 mg/dL

## 2017-12-31 LAB — CBC
HCT: 41 % (ref 39.0–52.0)
Hemoglobin: 13.4 g/dL (ref 13.0–17.0)
MCH: 30.3 pg (ref 26.0–34.0)
MCHC: 32.7 g/dL (ref 30.0–36.0)
MCV: 92.8 fL (ref 78.0–100.0)
Platelets: 272 10*3/uL (ref 150–400)
RBC: 4.42 MIL/uL (ref 4.22–5.81)
RDW: 13.5 % (ref 11.5–15.5)
WBC: 12.9 10*3/uL — ABNORMAL HIGH (ref 4.0–10.5)

## 2017-12-31 MED ORDER — ENOXAPARIN SODIUM 80 MG/0.8ML ~~LOC~~ SOLN
80.0000 mg | SUBCUTANEOUS | Status: DC
  Administered 2018-01-01 – 2018-01-02 (×2): 80 mg via SUBCUTANEOUS
  Filled 2017-12-31 (×2): qty 0.8

## 2017-12-31 MED ORDER — SODIUM CHLORIDE 0.9 % IV BOLUS (SEPSIS)
500.0000 mL | Freq: Once | INTRAVENOUS | Status: AC
  Administered 2017-12-31: 500 mL via INTRAVENOUS

## 2017-12-31 MED ORDER — HYDROMORPHONE HCL 1 MG/ML IJ SOLN: 0.5000 mg | INTRAMUSCULAR | Status: DC | PRN

## 2017-12-31 MED ORDER — HYDROMORPHONE HCL 1 MG/ML IJ SOLN
0.5000 mg | INTRAMUSCULAR | Status: DC | PRN
  Administered 2017-12-31 – 2018-01-01 (×5): 1 mg via INTRAVENOUS
  Administered 2018-01-01: 0.5 mg via INTRAVENOUS
  Administered 2018-01-01 – 2018-01-02 (×4): 1 mg via INTRAVENOUS
  Filled 2017-12-31 (×10): qty 1

## 2017-12-31 NOTE — Progress Notes (Signed)
Patient reports about numbness on Right hand , patient was offered to reposition,put heat packs and to do a little bit of movement (exercise) on the affected hand. But refused everything and said "that will not going to work". We will continue to monitor.

## 2017-12-31 NOTE — Progress Notes (Signed)
Subjective: [name] is postop day #1 from a right robotic PN and partial colectomy with take down of colovesical fistula.  He has expected post op pain.  He has no nausea.  The foley was removed this AM but no void yet.  JP output is 72ml for 24hrs.  His Cr is up to 1.4 from 0.94.  Hgb is 13.4.  ROS:  Review of Systems  Constitutional: Negative for chills and fever.  Respiratory: Negative for shortness of breath.   Cardiovascular: Negative for chest pain.  Gastrointestinal: Positive for abdominal pain. Negative for nausea.  Genitourinary: Negative for flank pain.  Neurological: Positive for sensory change (Numbness in the right hand from the elbow down ).    No Known Allergies  Past Medical History:  Diagnosis Date  . Arthritis   . Chronic kidney disease    mass r kidney   . COPD (chronic obstructive pulmonary disease) ([place])   . History of kidney stones   . Hx of adenomatous colonic polyps 06/09/2015  . Hyperlipidemia   . Hypertension    has previously been on medication but then taken off  . Neuromuscular disorder (HCC)    neuropathy lower legs and feet  . Neuropathy    in both feet  . Pneumonia 2011    Past Surgical History:  Procedure Laterality Date  . AMPUTATION Bilateral 06/05/2013   Procedure: RIGHT GREAT TOE AMPUTATION/LEFT GREAT TOE DEBRIDEMENT;  Surgeon: Wylene Simmer, MD;  Location: [facility];  Service: Orthopedics;  Laterality: Bilateral;  . COLON SURGERY     robotic partial colectomy Dr. Marcello Moores 12-30-17  . COLONOSCOPY     x 2 last date 09/21/2017 bladder attached to colon eval  . COLONOSCOPY WITH PROPOFOL N/A 09/21/2017   Procedure: COLONOSCOPY WITH PROPOFOL;  Surgeon: [name], MD;  Location: [facility];  Service: Endoscopy;  Laterality: N/A;  . MOUTH SURGERY    . PARTIAL NEPHRECTOMY     right Dr. Leighton Ruff 01[birth year]77  . ROBOTIC ASSITED PARTIAL NEPHRECTOMY Right 12/30/2017   Procedure: XI ROBOTIC ASSITED RIGHT PARTIAL NEPHRECTOMY;  Surgeon: Alexis Frock, MD;  Location: [facility];  Service: Urology;  Laterality: Right;  . TOE AMPUTATION Right 06/05/2013   RIGHT GREAT TOE PARTIAL AMPUTATION    . TOOTH EXTRACTION  2011   multiple teeth extracted    Social History   Socioeconomic History  . Marital status: Divorced    Spouse name: Not on file  . Number of children: 1  . Years of education: Not on file  . Highest education level: Not on file  Social Needs  . Financial resource strain: Not on file  . Food insecurity - worry: Not on file  . Food insecurity - inability: Not on file  . Transportation needs - medical: Not on file  . Transportation needs - non-medical: Not on file  Occupational History  . Occupation: retired  Tobacco Use  . Smoking status: Current Some Day Smoker    Packs/day: 0.10    Years: 20.00    Pack years: 2.00    Types: Cigarettes    Start date: 11/29/1973  . Smokeless tobacco: Never Used  . Tobacco comment: occasional smoker - ~1 pack/week. smoked ~1.5ppd until 5 yr ago  Substance and Sexual Activity  . Alcohol use: No    Alcohol/week: 0.0 oz  . Drug use: No  . Sexual activity: Not Currently    Partners: Female  Other Topics Concern  . Not on file  Social History Narrative  .  Not on file    Family History  Problem Relation Age of Onset  . Heart disease Mother   . Esophageal cancer Mother   . Diabetes Father   . Pancreatic cancer Father   . Breast cancer Sister   . Colon cancer Neg Hx   . Rectal cancer Neg Hx   . Stomach cancer Neg Hx   . Colon polyps Neg Hx     Anti-infectives: Anti-infectives (From admission, onward)   Start     Dose/Rate Route Frequency Ordered Stop   12/30/17 2330  cefoTEtan (CEFOTAN) 2 g in dextrose 5 % 50 mL IVPB     2 g 100 mL/hr over 30 Minutes Intravenous Every 12 hours 12/30/17 2121 12/30/17 2319   12/30/17 1100  cefoTEtan in Dextrose 5% (CEFOTAN) IVPB 2 g     2 g Intravenous On call to O.R. 12/30/17 1017 12/30/17 1325      Current Facility-Administered  Medications  Medication Dose Route Frequency Provider Last Rate Last Dose  . acetaminophen (TYLENOL) tablet 1,000 mg  1,000 mg Oral A4Z Leighton Ruff, MD   6,606 mg at 12/31/17 0533  . albuterol (PROVENTIL) (2.5 MG/3ML) 0.083% nebulizer solution 3 mL  3 mL Inhalation T0Z PRN Leighton Ruff, MD      . alum & mag hydroxide-simeth (MAALOX/MYLANTA) 200-200-20 MG/5ML suspension 30 mL  30 mL Oral S0F PRN Leighton Ruff, MD   30 mL at 12/31/17 0533  . alvimopan (ENTEREG) capsule 12 mg  12 mg Oral BID Leighton Ruff, MD      . atorvastatin (LIPITOR) tablet 10 mg  10 mg Oral Daily Leighton Ruff, MD      . collagenase Annitta Needs) ointment 1 application  1 application Topical Daily Leighton Ruff, MD   1 application at 09/32/35 2254  . dextrose 5 % and 0.45 % NaCl with KCl 20 mEq/L infusion   Intravenous Continuous Leighton Ruff, MD 75 mL/hr at 12/30/17 2249    . diphenhydrAMINE (BENADRYL) 12.5 MG/5ML elixir 12.5 mg  12.5 mg Oral T7D PRN Leighton Ruff, MD       Or  . diphenhydrAMINE (BENADRYL) injection 12.5 mg  12.5 mg Intravenous U2G PRN Leighton Ruff, MD      . enoxaparin (LOVENOX) injection 40 mg  40 mg Subcutaneous U54Y Leighton Ruff, MD      . folic acid (FOLVITE) tablet 1 mg  1 mg Oral Daily Leighton Ruff, MD      . HYDROmorphone (DILAUDID) 1 MG/ML injection           . HYDROmorphone (DILAUDID) injection 0.5 mg  0.5 mg Intravenous H0W PRN Leighton Ruff, MD   0.5 mg at 12/31/17 0452  . mometasone-formoterol (DULERA) 200-5 MCG/ACT inhaler 2 puff  2 puff Inhalation BID Leighton Ruff, MD      . ondansetron [facility]) tablet 4 mg  4 mg Oral C3J PRN Leighton Ruff, MD       Or  . ondansetron [facility]) injection 4 mg  4 mg Intravenous S2G PRN Leighton Ruff, MD   4 mg at 31/51/76 0008  . pregabalin (LYRICA) capsule 150 mg  150 mg Oral TID Leighton Ruff, MD   160 mg at 12/30/17 2248  . saccharomyces boulardii (FLORASTOR) capsule 250 mg  250 mg Oral BID Leighton Ruff, MD   737 mg at 12/30/17 2254  .  tamsulosin (FLOMAX) capsule 0.4 mg  0.4 mg Oral BID Leighton Ruff, MD   0.4 mg at 12/30/17 2248  . traMADol (ULTRAM) tablet 50 mg  50 mg Oral B5Z PRN Leighton Ruff, MD   50 mg at 12/31/17 0008  . varenicline (CHANTIX) tablet 1 mg  1 mg Oral BID Leighton Ruff, MD   1 mg at 02/58/52 2254     Objective: Vital signs in last 24 hours: Temp:  [97.4 F (36.3 C)-98.6 F (37 C)] 97.6 F (36.4 C) (02/01 2210) Pulse Rate:  [73-96] 78 (02/01 2210) Resp:  [10-20] 18 (02/01 2210) BP: (120-156)/(66-93) 123/66 (02/01 2210) SpO2:  [91 %-99 %] 97 % (02/01 2210) Weight:  [385 lb (174.6 kg)-395 lb 15.1 oz (179.6 kg)] 395 lb 15.1 oz (179.6 kg) (02/01 2135)  Intake/Output from previous day: 02/01 0701 - 02/02 0700 In: 4198.8 [P.O.:360; I.V.:3838.8] Out: 1300 [Urine:1055; Drains:95; Blood:150] Intake/Output this shift: Total I/O In: 2198.8 [P.O.:360; I.V.:1838.8] Out: 720 [Urine:625; Drains:95]   Physical Exam  Lab Results:  Recent Labs    12/31/17 0517  WBC 12.9*  HGB 13.4  HCT 41.0  PLT 272   BMET Recent Labs    12/31/17 0517  NA 134*  K 4.6  CL 100*  CO2 24  GLUCOSE 179*  BUN 16  CREATININE 1.40*  CALCIUM 8.5*   PT/INR No results for input(s): LABPROT, INR in the last 72 hours. ABG No results for input(s): PHART, HCO3 in the last 72 hours.  Invalid input(s): PCO2, PO2  Studies/Results: No results found.   Assessment: S/P right partial nephrectomy.  JP drainage was 79ml over the last 24 hours.   I will sent JP creatinine and if normal the drain can be removed.   Colovesical fistula with no leak post take down.   Foley removed this am.  No void yet.   AKI with bump in Cr to 1.4.   Will need to monitor closely.  Neuropraxia of the right hand.           [name] 12/31/2017 [phone]

## 2017-12-31 NOTE — Progress Notes (Signed)
PT Cancellation Note  Patient Details Name: [name]. MRN: [id number] DOB: 11-25-1956   Cancelled Treatment:    Reason Eval/Treat Not Completed: Patient declined, patient is eating but states that he wants to wait until tomorrow when he has things that he needs( something about inserts for shoes) and has visitors coming. Will check back another time.   Claretha Cooper 12/31/2017, 1:07 PM  Tresa Endo PT [phone]

## 2017-12-31 NOTE — Progress Notes (Addendum)
Patient continues to complain about numbness on right hand  Right radial pulse was assessed and is present as well as proper blood perfusion is noted ,instructed if we could continuously apply the heating packs and still refuses, scheduled Tylenol was given and PRN Tramadol patient states " this medicines doesn't work" and stated that he wants ice bag instead to apply on his right hand. Ice bag was applied to the affected hand. We will continue to monitor.

## 2017-12-31 NOTE — Progress Notes (Signed)
1 Day Post-Op   Subjective/Chief Complaint: Pt complains of being very sore, especially right flank.  He denies n/v, but is having some heartburn.  He is occasionally belching.  He had a small smear of liquid stool that he thought would be gas.    Objective: Vital signs in last 24 hours: Temp:  [97.4 F (36.3 C)-98.4 F (36.9 C)] 98.4 F (36.9 C) (02/02 1017) Pulse Rate:  [73-84] 75 (02/02 1017) Resp:  [10-18] 18 (02/02 1017) BP: (120-156)/(66-80) 128/80 (02/02 1017) SpO2:  [91 %-99 %] 95 % (02/02 1017) Weight:  [179.6 kg (395 lb 15.1 oz)] 179.6 kg (395 lb 15.1 oz) (02/01 2135) Last BM Date: 12/30/17  Intake/Output from previous day: 02/01 0701 - 02/02 0700 In: 4738.8 [P.O.:600; I.V.:4138.8] Out: 1300 [Urine:1055; Drains:95; Blood:150] Intake/Output this shift: Total I/O In: 540 [P.O.:240; I.V.:300] Out: -   General appearance: alert, cooperative and no distress Resp: breathing comfortably GI: soft, ? distended, but difficult to tell based on abdominal size.   Extremities: extremities normal, atraumatic, no cyanosis or edema  Lab Results:  Recent Labs    12/31/17 0517  WBC 12.9*  HGB 13.4  HCT 41.0  PLT 272   BMET Recent Labs    12/31/17 0517  NA 134*  K 4.6  CL 100*  CO2 24  GLUCOSE 179*  BUN 16  CREATININE 1.40*  CALCIUM 8.5*   PT/INR No results for input(s): LABPROT, INR in the last 72 hours. ABG No results for input(s): PHART, HCO3 in the last 72 hours.  Invalid input(s): PCO2, PO2  Studies/Results: No results found.  Anti-infectives: Anti-infectives (From admission, onward)   Start     Dose/Rate Route Frequency Ordered Stop   12/30/17 2330  cefoTEtan (CEFOTAN) 2 g in dextrose 5 % 50 mL IVPB     2 g 100 mL/hr over 30 Minutes Intravenous Every 12 hours 12/30/17 2121 12/30/17 2319   12/30/17 1100  cefoTEtan in Dextrose 5% (CEFOTAN) IVPB 2 g     2 g Intravenous On call to O.R. 12/30/17 1017 12/30/17 1325      Assessment/Plan: s/p  Procedure(s): XI ROBOT  PARTIAL COLECTOMY ERAS PATHWAY (N/A) XI ROBOTIC ASSITED RIGHT PARTIAL NEPHRECTOMY (Right) foley removed per urology  JP being sent for creatinine. Pt with AKI, will follow with small fluid bolus and recheck in AM.   COPD- wants to take symbicort.   Increase lovenox for vte prophylaxis.   HTN- on home meds.   Soft diet in AM.   LOS: 1 day    Stark Klein 12/31/2017

## 2018-01-01 ENCOUNTER — Other Ambulatory Visit: Payer: Self-pay

## 2018-01-01 LAB — BASIC METABOLIC PANEL
Anion gap: 5 (ref 5–15)
BUN: 21 mg/dL — ABNORMAL HIGH (ref 6–20)
CO2: 29 mmol/L (ref 22–32)
Calcium: 8.7 mg/dL — ABNORMAL LOW (ref 8.9–10.3)
Chloride: 105 mmol/L (ref 101–111)
Creatinine, Ser: 1.3 mg/dL — ABNORMAL HIGH (ref 0.61–1.24)
GFR calc Af Amer: >60 mL/min (ref >60)
GFR calc non Af Amer: 58 mL/min — ABNORMAL LOW (ref >60)
Glucose, Bld: 128 mg/dL — ABNORMAL HIGH (ref 65–99)
Potassium: 4.8 mmol/L (ref 3.5–5.1)
Sodium: 139 mmol/L (ref 135–145)

## 2018-01-01 LAB — CBC
HCT: 37.6 % — ABNORMAL LOW (ref 39.0–52.0)
Hemoglobin: 12.4 g/dL — ABNORMAL LOW (ref 13.0–17.0)
MCH: 30.3 pg (ref 26.0–34.0)
MCHC: 33 g/dL (ref 30.0–36.0)
MCV: 91.9 fL (ref 78.0–100.0)
Platelets: 232 10*3/uL (ref 150–400)
RBC: 4.09 MIL/uL — ABNORMAL LOW (ref 4.22–5.81)
RDW: 13.7 % (ref 11.5–15.5)
WBC: 15.1 10*3/uL — ABNORMAL HIGH (ref 4.0–10.5)

## 2018-01-01 NOTE — Progress Notes (Signed)
2 Days Post-Op  Subjective: Mr. Salminen is doing well.  The tamsulosin was increased to BID and he is voiding well.  He is starting to have BM's.  JP creatinine 1.5 with reduced volume. ROS:  ROS  Anti-infectives: Anti-infectives (From admission, onward)   Start     Dose/Rate Route Frequency Ordered Stop   12/30/17 2330  cefoTEtan (CEFOTAN) 2 g in dextrose 5 % 50 mL IVPB     2 g 100 mL/hr over 30 Minutes Intravenous Every 12 hours 12/30/17 2121 12/30/17 2319   12/30/17 1100  cefoTEtan in Dextrose 5% (CEFOTAN) IVPB 2 g     2 g Intravenous On call to O.R. 12/30/17 1017 12/30/17 1325      Current Facility-Administered Medications  Medication Dose Route Frequency Provider Last Rate Last Dose  . acetaminophen (TYLENOL) tablet 1,000 mg  1,000 mg Oral K0U Leighton Ruff, MD   5,427 mg at 01/01/18 0719  . albuterol (PROVENTIL) (2.5 MG/3ML) 0.083% nebulizer solution 3 mL  3 mL Inhalation C6C PRN Leighton Ruff, MD      . alum & mag hydroxide-simeth (MAALOX/MYLANTA) 200-200-20 MG/5ML suspension 30 mL  30 mL Oral B7S PRN Leighton Ruff, MD   30 mL at 12/31/17 2337  . atorvastatin (LIPITOR) tablet 10 mg  10 mg Oral Daily Leighton Ruff, MD   10 mg at 12/31/17 2831  . collagenase (SANTYL) ointment 1 application  1 application Topical Daily Leighton Ruff, MD   1 application at 51/76/16 [phone]  . dextrose 5 % and 0.45 % NaCl with KCl 20 mEq/L infusion   Intravenous Continuous Leighton Ruff, MD 75 mL/hr at 01/01/18 0600    . diphenhydrAMINE (BENADRYL) 12.5 MG/5ML elixir 12.5 mg  12.5 mg Oral T0G PRN Leighton Ruff, MD       Or  . diphenhydrAMINE (BENADRYL) injection 12.5 mg  12.5 mg Intravenous Y6R PRN Leighton Ruff, MD      . enoxaparin (LOVENOX) injection 80 mg  80 mg Subcutaneous Q24H Stark Klein, MD      . folic acid (FOLVITE) tablet 1 mg  1 mg Oral Daily Leighton Ruff, MD   1 mg at 48/54/62 7035  . HYDROmorphone (DILAUDID) injection 0.5-1 mg  0.5-1 mg Intravenous Q2H PRN Stark Klein, MD   0.5  mg at 01/01/18 0521  . mometasone-formoterol (DULERA) 200-5 MCG/ACT inhaler 2 puff  2 puff Inhalation BID Leighton Ruff, MD      . ondansetron [facility]) tablet 4 mg  4 mg Oral K0X PRN Leighton Ruff, MD       Or  . ondansetron [facility]) injection 4 mg  4 mg Intravenous F8H PRN Leighton Ruff, MD   4 mg at 82/99/37 0008  . pregabalin (LYRICA) capsule 150 mg  150 mg Oral TID Leighton Ruff, MD   169 mg at 12/31/17 2112  . saccharomyces boulardii (FLORASTOR) capsule 250 mg  250 mg Oral BID Leighton Ruff, MD   678 mg at 12/31/17 2112  . tamsulosin (FLOMAX) capsule 0.4 mg  0.4 mg Oral BID Leighton Ruff, MD   0.4 mg at 12/31/17 2112  . traMADol (ULTRAM) tablet 50 mg  50 mg Oral L3Y PRN Leighton Ruff, MD   50 mg at 01/01/18 0024  . varenicline (CHANTIX) tablet 1 mg  1 mg Oral BID Leighton Ruff, MD   1 mg at 09/14/50 2112     Objective: Vital signs in last 24 hours: Temp:  [97.9 F (36.6 C)-99.2 F (37.3 C)] 98 F (36.7 C) (02/03 0514)  Pulse Rate:  [75-88] 87 (02/03 0514) Resp:  [16-20] 16 (02/03 0514) BP: (128-150)/(72-91) 142/91 (02/03 0514) SpO2:  [95 %-98 %] 96 % (02/03 0514) Weight:  [178.3 kg (393 lb)] 178.3 kg (393 lb) (02/02 2104)  Intake/Output from previous day: 02/02 0701 - 02/03 0700 In: 2460 [P.O.:1560; I.V.:900] Out: 3110 [Urine:3050; Drains:60] Intake/Output this shift: Total I/O In: 360 [P.O.:360] Out: -    Physical Exam  Lab Results:  Recent Labs    12/31/17 0517 01/01/18 0500  WBC 12.9* 15.1*  HGB 13.4 12.4*  HCT 41.0 37.6*  PLT 272 232   BMET Recent Labs    12/31/17 0517 01/01/18 0500  NA 134* 139  K 4.6 4.8  CL 100* 105  CO2 24 29  GLUCOSE 179* 128*  BUN 16 21*  CREATININE 1.40* 1.30*  CALCIUM 8.5* 8.7*   PT/INR No results for input(s): LABPROT, INR in the last 72 hours. ABG No results for input(s): PHART, HCO3 in the last 72 hours.  Invalid input(s): PCO2, PO2  Studies/Results: No results found.   Assessment and Plan: He is  voiding well on tamsulosin bid.  JP output is reduced and not urine.  Ordered removal.       LOS: 2 days    Irine Seal 01/01/2018 [phone] ID: Seabron Spates., male   DOB: 1956/11/17, 62 y.o.   MRN: [id number]

## 2018-01-01 NOTE — Evaluation (Signed)
Physical Therapy Evaluation- 1 x eval only  Patient Details Name: [name]. MRN: [id number] DOB: [birth year] Today's Date: 01/01/2018   History of Present Illness  pt s/p partial nephrectomy and partial colectomoy, no drains in place at this time, just abdomina incisions. chronic venous stasis ulcers and neuropathy in his lower extremities, with wounds on great toes being treated by the wound center as well.   Clinical Impression  Pt assessed and was able to ambulate the entire unit for 4th floor with no SOB or LOB. No further need for PT at this time. Encouraged pt to walk with staff or family.   Will sign off at this time. Thanks   Follow Up Recommendations No PT follow up    Equipment Recommendations  None recommended by PT    Recommendations for Other Services       Precautions / Restrictions Restrictions Weight Bearing Restrictions: No      Mobility  Bed Mobility Overal bed mobility: Modified Independent                Transfers Overall transfer level: Modified independent Equipment used: Straight cane                Ambulation/Gait Ambulation/Gait assistance: Supervision Ambulation Distance (Feet): 400 Feet(walked enter unit ) Assistive device: Straight cane Gait Pattern/deviations: Step-through pattern     General Gait Details: normal gait pattern just walking on heels a little due to toe wounds , but no LOB , good staeady pace.   Stairs            Wheelchair Mobility    Modified Rankin (Stroke Patients Only)       Balance                                             Pertinent Vitals/Pain Pain Assessment: 0-10 Pain Score: 2  Pain Location: in abdomen and R side  Pain Descriptors / Indicators: Aching Pain Intervention(s): Monitored during session;Ice applied    Home Living Family/patient expects to be discharged to:: Private residence Living Arrangements: Alone Available Help at Discharge:  Family;Friend(s);Available PRN/intermittently Type of Home: House         Home Equipment: [name] - single point      Prior Function Level of Independence: Independent         Comments: pt states he is independent and uses cane to steady at times due to his toes.      Hand Dominance        Extremity/Trunk Assessment        Lower Extremity Assessment Lower Extremity Assessment: Overall WFL for tasks assessed       Communication   Communication: No difficulties  Cognition Arousal/Alertness: Awake/alert Behavior During Therapy: WFL for tasks assessed/performed Overall Cognitive Status: Within Functional Limits for tasks assessed                                        General Comments      Exercises     Assessment/Plan    PT Assessment Patent does not need any further PT services  PT Problem List         PT Treatment Interventions      PT Goals (Current goals can be found in the Care  Plan section)  Acute Rehab PT Goals PT Goal Formulation: All assessment and education complete, DC therapy    Frequency     Barriers to discharge        Co-evaluation               AM-PAC PT "6 Clicks" Daily Activity  Outcome Measure Difficulty turning over in bed (including adjusting bedclothes, sheets and blankets)?: None Difficulty moving from lying on back to sitting on the side of the bed? : None Difficulty sitting down on and standing up from a chair with arms (e.g., wheelchair, bedside commode, etc,.)?: None Help needed moving to and from a bed to chair (including a wheelchair)?: None Help needed walking in hospital room?: None Help needed climbing 3-5 steps with a railing? : None 6 Click Score: 24    End of Session   Activity Tolerance: Patient tolerated treatment well Patient left: in bed Nurse Communication: Mobility status PT Visit Diagnosis: Muscle weakness (generalized) (M62.81)    Time: 7494-4967 PT Time Calculation (min)  (ACUTE ONLY): 22 min   Charges:   PT Evaluation $PT Eval Low Complexity: 1 Low     PT G CodesClide Dales, PT Pager: [phone] 01/01/2018   Talen Poser, [name] 01/01/2018, 5:43 PM

## 2018-01-01 NOTE — Progress Notes (Signed)
Dressing change done to rt and lt amputated great toe wounds. Santyl applied as ordered. Covered areas with non-woven gauze and secured with kerlix.

## 2018-01-01 NOTE — Progress Notes (Signed)
2 Days Post-Op   Subjective/Chief Complaint: Small stool.  Some flatus.  Still some belching/heartburn.  No n/v.  Still needing IV pain meds.  Tolerated soft diet.  Has not ambulated yet.   Right flank pain improved since JP removed.  Objective: Vital signs in last 24 hours: Temp:  [97.9 F (36.6 C)-99.2 F (37.3 C)] 98 F (36.7 C) (02/03 0514) Pulse Rate:  [83-88] 87 (02/03 0514) Resp:  [16-20] 16 (02/03 0514) BP: (133-150)/(72-91) 142/91 (02/03 0514) SpO2:  [95 %-98 %] 96 % (02/03 0514) Weight:  [178.3 kg (393 lb)] 178.3 kg (393 lb) (02/02 2104) Last BM Date: 12/31/17  Intake/Output from previous day: 02/02 0701 - 02/03 0700 In: 2460 [P.O.:1560; I.V.:900] Out: 3110 [Urine:3050; Drains:60] Intake/Output this shift: Total I/O In: 360 [P.O.:360] Out: 15 [Drains:15]  General appearance: alert, cooperative and no distress Resp: breathing comfortably GI: soft, protuberant, but difficult to tell if distended based on abdominal size.   Extremities: extremities normal, atraumatic, no cyanosis or edema  Lab Results:  Recent Labs    12/31/17 0517 01/01/18 0500  WBC 12.9* 15.1*  HGB 13.4 12.4*  HCT 41.0 37.6*  PLT 272 232   BMET Recent Labs    12/31/17 0517 01/01/18 0500  NA 134* 139  K 4.6 4.8  CL 100* 105  CO2 24 29  GLUCOSE 179* 128*  BUN 16 21*  CREATININE 1.40* 1.30*  CALCIUM 8.5* 8.7*   PT/INR No results for input(s): LABPROT, INR in the last 72 hours. ABG No results for input(s): PHART, HCO3 in the last 72 hours.  Invalid input(s): PCO2, PO2  Studies/Results: No results found.  Anti-infectives: Anti-infectives (From admission, onward)   Start     Dose/Rate Route Frequency Ordered Stop   12/30/17 2330  cefoTEtan (CEFOTAN) 2 g in dextrose 5 % 50 mL IVPB     2 g 100 mL/hr over 30 Minutes Intravenous Every 12 hours 12/30/17 2121 12/30/17 2319   12/30/17 1100  cefoTEtan in Dextrose 5% (CEFOTAN) IVPB 2 g     2 g Intravenous On call to O.R. 12/30/17 1017  12/30/17 1325      Assessment/Plan: s/p Procedure(s): XI ROBOT  PARTIAL COLECTOMY ERAS PATHWAY (N/A) XI ROBOTIC ASSITED RIGHT PARTIAL NEPHRECTOMY (Right) foley removed per urology  JP cr 1.5, d/c per urology.   Pt with AKI, creatinine coming back down.     COPD- symbicort ok Increase lovenox for vte prophylaxis.   HTN- on home meds.   Soft diet. Ambulate Saline lock IVF Hopefully home tomorrow   LOS: 2 days    Stark Klein 01/01/2018

## 2018-01-02 ENCOUNTER — Encounter (HOSPITAL_COMMUNITY): Payer: Self-pay | Admitting: General Surgery

## 2018-01-02 LAB — BASIC METABOLIC PANEL
Anion gap: 7 (ref 5–15)
BUN: 18 mg/dL (ref 6–20)
CO2: 27 mmol/L (ref 22–32)
Calcium: 8.3 mg/dL — ABNORMAL LOW (ref 8.9–10.3)
Chloride: 104 mmol/L (ref 101–111)
Creatinine, Ser: 1.29 mg/dL — ABNORMAL HIGH (ref 0.61–1.24)
GFR calc Af Amer: >60 mL/min (ref >60)
GFR calc non Af Amer: 59 mL/min — ABNORMAL LOW (ref >60)
Glucose, Bld: 96 mg/dL (ref 65–99)
Potassium: 4.2 mmol/L (ref 3.5–5.1)
Sodium: 138 mmol/L (ref 135–145)

## 2018-01-02 LAB — CBC
HCT: 38.4 % — ABNORMAL LOW (ref 39.0–52.0)
Hemoglobin: 12.5 g/dL — ABNORMAL LOW (ref 13.0–17.0)
MCH: 30 pg (ref 26.0–34.0)
MCHC: 32.6 g/dL (ref 30.0–36.0)
MCV: 92.3 fL (ref 78.0–100.0)
Platelets: 223 10*3/uL (ref 150–400)
RBC: 4.16 MIL/uL — ABNORMAL LOW (ref 4.22–5.81)
RDW: 13.8 % (ref 11.5–15.5)
WBC: 12.4 10*3/uL — ABNORMAL HIGH (ref 4.0–10.5)

## 2018-01-02 MED ORDER — TRAMADOL HCL 50 MG PO TABS: 50.0000 mg | ORAL_TABLET | Freq: Four times a day (QID) | ORAL | 0 refills | Status: DC | PRN

## 2018-01-02 MED ORDER — TRAMADOL HCL 50 MG PO TABS: 50.0000 mg | ORAL_TABLET | Freq: Four times a day (QID) | ORAL | Status: DC | PRN

## 2018-01-02 NOTE — Progress Notes (Signed)
3 Days Post-Op   Subjective/Chief Complaint:   1 - Solid Right Renal Mass - s/p RIGHT robotic partial nephrectomy 12/30/2017 for 3.6cm Rt mid lateral mass by CT and dedicated MRI 2018 on eval inguinal adenopathy that was proven reactive. Mass is 3.6cm, about 50% endophytic, and does not appear to involve hilar fat. 2 artery / 1 vein right renovascular anatomy. Cr 1.05. Path pending. JP out 2/2 as Cr same as serum.   2 - Colovesical Fistula - s/p colovesical fistula repair / segmental collection and primary anastamosis 12/30/17 in combined gen surg Marcello Moores) and [facility]) repair. Foley out and voiding as per baseline.   Today "[name]" is seen stable. Tollerating reg diet with resuemd bowel fundtion. Still some pain with ambulation as expected. Wants to go home.     Objective: Vital signs in last 24 hours: Temp:  [97.7 F (36.5 C)-98.8 F (37.1 C)] 97.7 F (36.5 C) (02/04 0506) Pulse Rate:  [81-87] 87 (02/04 0506) Resp:  [18] 18 (02/04 0506) BP: (126-155)/(70-82) 126/70 (02/04 0506) SpO2:  [90 %-99 %] 90 % (02/04 0506) Weight:  [175.9 kg (387 lb 12.8 oz)] 175.9 kg (387 lb 12.8 oz) (02/04 0506) Last BM Date: 01/01/18  Intake/Output from previous day: 02/03 0701 - 02/04 0700 In: 840 [P.O.:840] Out: 215 [Urine:200; Drains:15] Intake/Output this shift: No intake/output data recorded.  General appearance: alert, cooperative and appears stated age Eyes: negative Nose: Nares normal. Septum midline. Mucosa normal. No drainage or sinus tenderness. Throat: lips, mucosa, and tongue normal; teeth and gums normal Neck: supple, symmetrical, trachea midline Back: symmetric, no curvature. ROM normal. No CVA tenderness. Resp: non-labored on room air.  Cardio: NL rate GI: soft, non-tender; bowel sounds normal; no masses,  no organomegaly Extremities: extremities normal, atraumatic, no cyanosis or edema Pulses: 2+ and symmetric Skin: Skin color, texture, turgor normal. No rashes or  lesions Lymph nodes: Cervical, supraclavicular, and axillary nodes normal. Neurologic: Grossly normal Incision/Wound: stable large trunacal obesity. Prior surgical sites c/d/i.   Lab Results:  Recent Labs    01/01/18 0500 01/02/18 0420  WBC 15.1* 12.4*  HGB 12.4* 12.5*  HCT 37.6* 38.4*  PLT 232 223   BMET Recent Labs    01/01/18 0500 01/02/18 0420  NA 139 138  K 4.8 4.2  CL 105 104  CO2 29 27  GLUCOSE 128* 96  BUN 21* 18  CREATININE 1.30* 1.29*  CALCIUM 8.7* 8.3*   PT/INR No results for input(s): LABPROT, INR in the last 72 hours. ABG No results for input(s): PHART, HCO3 in the last 72 hours.  Invalid input(s): PCO2, PO2  Studies/Results: No results found.  Anti-infectives: Anti-infectives (From admission, onward)   Start     Dose/Rate Route Frequency Ordered Stop   12/30/17 2330  cefoTEtan (CEFOTAN) 2 g in dextrose 5 % 50 mL IVPB     2 g 100 mL/hr over 30 Minutes Intravenous Every 12 hours 12/30/17 2121 12/30/17 2319   12/30/17 1100  cefoTEtan in Dextrose 5% (CEFOTAN) IVPB 2 g     2 g Intravenous On call to O.R. 12/30/17 1017 12/30/17 1325      Assessment/Plan:  1 - Solid Right Renal Mass - path pending. NO furhter cancer directed care in house.   2 - Colovesical Fistula - doing well with resumed bowel function. THis is fantastic result.  I feel pt meeting all goals for discharge from GU perspective. He has outpatient follow up arrange with [name].  Please call me directly with questions  anytime.   [facility], Carmen Vallecillo 01/02/2018

## 2018-01-02 NOTE — Discharge Summary (Signed)
Physician Discharge Summary  Patient ID: [name]. MRN: [id number] DOB/AGE: Sep 11, 1956 62 y.o.  Admit date: 12/30/2017 Discharge date: 01/02/2018  Admission Diagnoses: Colovesical fistula  Discharge Diagnoses:  Active Problems:   Colovesical fistula   Discharged Condition: good  Hospital Course: Pt admitted after surgery.  Diet was advanced as tolerated.  His chemistry was followed to check renal function.  This was returning to normal by POD 3.  He was ambulating well.  He was felt to be in stable condition for d/c by POD 3.  Consults: None  Significant Diagnostic Studies: labs: cbc, bmet  Treatments: IV hydration, analgesia: acetaminophen and surgery: robotic partial colectomy and nephrectomy  Discharge Exam: Blood pressure 126/70, pulse 87, temperature 97.7 F (36.5 C), temperature source Oral, resp. rate 18, height 6\' 8"  (2.032 m), weight (!) 175.9 kg (387 lb 12.8 oz), SpO2 90 %. General appearance: alert and cooperative GI: normal findings: soft, non-tender Incision/Wound: clean, dry, intact  Disposition: 01-Home or Self Care   Allergies as of 01/02/2018   No Known Allergies     Medication List    TAKE these medications   aspirin 81 MG chewable tablet Chew 81 mg by mouth daily.   atorvastatin 10 MG tablet Commonly known as:  LIPITOR Take 10 mg by mouth daily.   b complex vitamins tablet Take 1 tablet by mouth daily.   collagenase ointment Commonly known as:  SANTYL Apply 1 application topically daily.   folic acid 388 MCG tablet Commonly known as:  FOLVITE Take 800 mcg by mouth daily.   pregabalin 150 MG capsule Commonly known as:  LYRICA Take 1 capsule (150 mg total) by mouth 3 (three) times daily.   PROAIR HFA 108 (90 Base) MCG/ACT inhaler Generic drug:  albuterol Inhale 2 puffs into the lungs every 6 (six) hours as needed (for wheezing/shortness of breath.).   SYMBICORT 160-4.5 MCG/ACT inhaler Generic drug:  budesonide-formoterol Inhale  2 puffs into the lungs 2 (two) times daily.   tamsulosin 0.4 MG Caps capsule Commonly known as:  FLOMAX Take 0.4 mg by mouth 2 (two) times daily.   traMADol 50 MG tablet Commonly known as:  ULTRAM Take 1-2 tablets (50-100 mg total) by mouth every 6 (six) hours as needed (mild pain).   Turmeric 500 MG Caps Take 500 mg by mouth daily.   varenicline 1 MG tablet Commonly known as:  CHANTIX Take 1 mg by mouth 2 (two) times daily.      Follow-up Information    Alexis Frock, MD Follow up on 01/16/2018.   Specialty:  Urology Why:  at 10:45 for MD visit. Dr. Tresa Moore will call you with pathology results when available.  Contact information: [place] [address] [phone]        Leighton Ruff, MD Follow up in 2 week(s).   Specialty:  General Surgery Why:  or as scheduled Contact information: [place] [address] [phone]           Signed: Rosario Adie 8/0/1655, 3:74 AM

## 2018-01-02 NOTE — Op Note (Signed)
NAME:  [name], [name]                    ACCOUNT NO.:  MEDICAL RECORD NO.:  [id number]  LOCATION:                                 FACILITY:  PHYSICIAN:  Alexis Frock, MD     DATE OF BIRTH:  1956/03/15  DATE OF PROCEDURE: 12/30/2017                              OPERATIVE REPORT   DIAGNOSIS:  Right renal mass, colovesical fistula.  PROCEDURES: 1. Robotic-assisted laparoscopic right partial nephrectomy. 2. Intraoperative ultrasound with interpretation.  ESTIMATED BLOOD LOSS:  100 cc.  COMPLICATION:  None.  SPECIMENS:  Right partial nephrectomy.  ASSISTANT:  Leighton Ruff, MD  DRAINS: 1. Jackson-Pratt drain to bulb suction. 2. Foley catheter to straight drain.  FINDINGS: 1. Two artery, one vein right renovascular anatomy as anticipated. 2. Approximately 50% endophytic right lateral renal mass as     anticipated by intraoperative ultrasound.  INDICATIONS:  [name] is a 62 year old gentleman with history of morbid obesity.  He was found to have a colovesical fistula as well as an enhancing right renal mass greater than 3 cm.  He was then evaluated by the General Surgery team for his colovesical fistula.  Felt that segmental colon resection was warranted.  I evaluated from this issue as well and his fistula tract appeared to well away from the ureters and trigone area at the posterior dome, his right renal mass.  We discussed options for this including surveillance protocols versus ablative therapies versus surgical extirpation with and without nephron sparing. Overall, he wished to proceed with combined General Surgery-Urology approach surgery for right robotic partial nephrectomy combined with colovesical fistula repair and segmental colectomy.  Informed consent was obtained and placed in the medical record.  PROCEDURE IN DETAIL:  The patient being [name], was verified. Procedure being right partial nephrectomy was confirmed.  Procedure was carried out.   Time-out was performed.  Intravenous antibiotics were administered.  General endotracheal anesthesia was induced.  The patient was placed into a right side up full flank position, employing 15 degrees of stable flexion, superior arm elevator, axillary roll, sequential compression devices, bottom leg bent, top leg straight.  He was further fashioned to the operating table using a 3-inch tape over foam padding across his supraxiphoid chest and his abdomen.  Exquisite care was taken and positioned to avoid pressure of all bony prominences given the patient's very large size.  Foley catheter had been placed per urethra to straight drain.  Next, a high-flow, low-pressure pneumoperitoneum was obtained using Veress technique in the right lower quadrant having passed the aspiration and drop test.  An 8-mm robotic camera port was then placed and positioned approximately 1-1/2 handbreadths superolateral to the umbilicus.  Laparoscopic examination of the peritoneal cavity revealed no significant adhesions, no visceral injury.  Additional ports were then placed as follows:  Right subcostal 5-mm liver retraction port, right subcostal paramedian robotic port, right far lateral 8-mm robotic port approximately 1 handbreadth superomedial to the anterior superior iliac spine, right paramedian inferior robotic port approximately 2 handbreadths superior to the pubic symphysis and two assistant port sites in the midline 12 mm; one 4 fingerbreadths above the camera port, one 4 fingerbreadths below.  The superior one being AirSeal type.  A self-locking grasper was used to elevate the lower edge of the liver above the anterior surface of the kidney.  Robot was then docked and passed through the electronic checks. Initial attention was directed at development of the left retroperitoneum.  Incision was made lateral to the descending colon from the area of the hepatic flexure towards the area of the internal  ring, that was carefully swept medially.  The lower pole of the kidney area was identified and placed on gentle lateral traction.  The duodenal was encountered and very carefully kocherized medially, such that it lie medial to the lateral edge of the inferior vena cava.  The ureter and gonadal vessels were encountered as was the psoas musculature and dissection proceeded within this triangle superiorly.  The gonadal vessels were allowed to lie medially and dissection proceeded within this plane towards the area of the renal hilum.  Renal hilum consisted of two artery, single vein renovascular anatomy as anticipated.  The arteries were carefully circumferentially mobilized for the vessel loop for en bloc clamping of these during partial nephrectomy.  Attention was directed at identification of the mass.  As expected, the mass was approximately 50% exophytic by visual cues approximately at the level of the mid lateral kidney, the area of the parenchymal interface and tumor was carefully circumscribed.  The border of this was somewhat indistinct.  Therefore, intraoperative ultrasound was performed using drop-in probe.  Intraoperative ultrasound revealed a predominantly solid mass on right lateral kidney approximately 50% exophytic as anticipated.  It appeared to abut, not directly involved the collecting system.  Using ultrasound and visual cues, an area for partial nephrectomy was carefully scored on the area of the renal parenchyma.  A warm ischemia was achieved using two extra-large bulldog clamps on the arteries en bloc alone and partial nephrectomy was performed using cold scissors keeping what appeared to be a normal rim of tissue around the area of tumor.  First-layer renorrhaphy was performed using running 3-0 V-Loc suture, oversewing several small venous sinuses x2.  There were no obvious collecting systems entered to this.  A bolster of Surgicel was applied, and four parenchymal  apposition sutures of 0 Vicryl, sandwiched between Hem-O- Loks and lapper ties were applied, which resulted in excellent parenchymal apposition, after 10 cc of FloSeal was applied.  The bulldog clamps were removed for total warm ischemia time of 17 minutes. Hemostasis appeared excellent.  Gerota's fascia was reapproximated over the area of renorrhaphy using running 3-0 V-Loc.  The partial nephrectomy specimen was placed in an EndoCatch bag for later retrieval. The closed suction drain was brought through the previous lateral most robotic port site near the peritoneal cavity.  All ports except for the inferior most 12-mm assistant port site and the inferior paramedian site were closed at the level of the skin using subcuticular Monocryl followed by Dermabond.  The remaining sites being covered with Ioban. The patient was then repositioned into a supine position for the colovesical fistula repair and segmental colectomy as per separate operative note dictated by Dr. Marcello Moores.  After these procedures performed, the patient was taken to the postanesthesia care unit in stable condition, and there were no immediate perioperative complications.         ______________________________ Alexis Frock, MD    TM/MEDQ  D:  12/30/2017  T:  12/30/2017  Job:  [id number]

## 2018-01-02 NOTE — Discharge Instructions (Signed)

## 2018-01-16 DIAGNOSIS — R3915 Urgency of urination: Secondary | ICD-10-CM | POA: Diagnosis not present

## 2018-01-31 DIAGNOSIS — Z6841 Body Mass Index (BMI) 40.0 and over, adult: Secondary | ICD-10-CM | POA: Diagnosis not present

## 2018-01-31 DIAGNOSIS — K632 Fistula of intestine: Secondary | ICD-10-CM | POA: Diagnosis not present

## 2018-01-31 DIAGNOSIS — Z905 Acquired absence of kidney: Secondary | ICD-10-CM | POA: Diagnosis not present

## 2018-01-31 DIAGNOSIS — Z9049 Acquired absence of other specified parts of digestive tract: Secondary | ICD-10-CM | POA: Diagnosis not present

## 2018-01-31 DIAGNOSIS — N2889 Other specified disorders of kidney and ureter: Secondary | ICD-10-CM | POA: Diagnosis not present

## 2018-04-04 DIAGNOSIS — Z125 Encounter for screening for malignant neoplasm of prostate: Secondary | ICD-10-CM | POA: Diagnosis not present

## 2018-04-04 DIAGNOSIS — R7309 Other abnormal glucose: Secondary | ICD-10-CM | POA: Diagnosis not present

## 2018-04-04 DIAGNOSIS — E7849 Other hyperlipidemia: Secondary | ICD-10-CM | POA: Diagnosis not present

## 2018-04-04 DIAGNOSIS — I10 Essential (primary) hypertension: Secondary | ICD-10-CM | POA: Diagnosis not present

## 2018-04-04 DIAGNOSIS — R82998 Other abnormal findings in urine: Secondary | ICD-10-CM | POA: Diagnosis not present

## 2018-04-11 DIAGNOSIS — R42 Dizziness and giddiness: Secondary | ICD-10-CM | POA: Diagnosis not present

## 2018-04-11 DIAGNOSIS — Z0000 Encounter for general adult medical examination without abnormal findings: Secondary | ICD-10-CM | POA: Diagnosis not present

## 2018-04-11 DIAGNOSIS — G6289 Other specified polyneuropathies: Secondary | ICD-10-CM | POA: Diagnosis not present

## 2018-04-11 DIAGNOSIS — L97909 Non-pressure chronic ulcer of unspecified part of unspecified lower leg with unspecified severity: Secondary | ICD-10-CM | POA: Diagnosis not present

## 2018-04-11 DIAGNOSIS — I2510 Atherosclerotic heart disease of native coronary artery without angina pectoris: Secondary | ICD-10-CM | POA: Diagnosis not present

## 2018-04-11 DIAGNOSIS — J449 Chronic obstructive pulmonary disease, unspecified: Secondary | ICD-10-CM | POA: Diagnosis not present

## 2018-04-11 DIAGNOSIS — Z89411 Acquired absence of right great toe: Secondary | ICD-10-CM | POA: Diagnosis not present

## 2018-04-11 DIAGNOSIS — I10 Essential (primary) hypertension: Secondary | ICD-10-CM | POA: Diagnosis not present

## 2018-04-13 DIAGNOSIS — Z1212 Encounter for screening for malignant neoplasm of rectum: Secondary | ICD-10-CM | POA: Diagnosis not present

## 2018-05-09 ENCOUNTER — Encounter (HOSPITAL_BASED_OUTPATIENT_CLINIC_OR_DEPARTMENT_OTHER): Payer: Medicare PPO | Attending: Internal Medicine

## 2018-05-09 DIAGNOSIS — J449 Chronic obstructive pulmonary disease, unspecified: Secondary | ICD-10-CM | POA: Insufficient documentation

## 2018-05-09 DIAGNOSIS — M2060 Acquired deformities of toe(s), unspecified, unspecified foot: Secondary | ICD-10-CM | POA: Insufficient documentation

## 2018-05-09 DIAGNOSIS — I87333 Chronic venous hypertension (idiopathic) with ulcer and inflammation of bilateral lower extremity: Secondary | ICD-10-CM | POA: Diagnosis not present

## 2018-05-09 DIAGNOSIS — I890 Lymphedema, not elsewhere classified: Secondary | ICD-10-CM | POA: Diagnosis not present

## 2018-05-09 DIAGNOSIS — F17200 Nicotine dependence, unspecified, uncomplicated: Secondary | ICD-10-CM | POA: Diagnosis not present

## 2018-05-09 DIAGNOSIS — Z89411 Acquired absence of right great toe: Secondary | ICD-10-CM | POA: Diagnosis not present

## 2018-05-09 DIAGNOSIS — G629 Polyneuropathy, unspecified: Secondary | ICD-10-CM | POA: Diagnosis not present

## 2018-05-09 DIAGNOSIS — L97522 Non-pressure chronic ulcer of other part of left foot with fat layer exposed: Secondary | ICD-10-CM | POA: Insufficient documentation

## 2018-05-09 DIAGNOSIS — I10 Essential (primary) hypertension: Secondary | ICD-10-CM | POA: Diagnosis not present

## 2018-05-09 DIAGNOSIS — L97512 Non-pressure chronic ulcer of other part of right foot with fat layer exposed: Secondary | ICD-10-CM | POA: Insufficient documentation

## 2018-05-09 DIAGNOSIS — G603 Idiopathic progressive neuropathy: Secondary | ICD-10-CM | POA: Diagnosis not present

## 2018-05-10 DIAGNOSIS — L97529 Non-pressure chronic ulcer of other part of left foot with unspecified severity: Secondary | ICD-10-CM | POA: Diagnosis not present

## 2018-05-11 DIAGNOSIS — G8918 Other acute postprocedural pain: Secondary | ICD-10-CM | POA: Diagnosis not present

## 2018-05-11 DIAGNOSIS — Z89411 Acquired absence of right great toe: Secondary | ICD-10-CM | POA: Diagnosis not present

## 2018-05-11 DIAGNOSIS — L97522 Non-pressure chronic ulcer of other part of left foot with fat layer exposed: Secondary | ICD-10-CM | POA: Diagnosis not present

## 2018-05-11 DIAGNOSIS — Z6841 Body Mass Index (BMI) 40.0 and over, adult: Secondary | ICD-10-CM | POA: Diagnosis not present

## 2018-05-11 DIAGNOSIS — L97512 Non-pressure chronic ulcer of other part of right foot with fat layer exposed: Secondary | ICD-10-CM | POA: Diagnosis not present

## 2018-05-16 ENCOUNTER — Other Ambulatory Visit (HOSPITAL_BASED_OUTPATIENT_CLINIC_OR_DEPARTMENT_OTHER): Payer: Self-pay | Admitting: Internal Medicine

## 2018-05-16 ENCOUNTER — Ambulatory Visit (HOSPITAL_COMMUNITY)
Admission: RE | Admit: 2018-05-16 | Discharge: 2018-05-16 | Disposition: A | Discharge status: Home / Self Care | Payer: Medicare PPO | Source: Ambulatory Visit | Attending: Internal Medicine | Admitting: Internal Medicine

## 2018-05-16 DIAGNOSIS — G629 Polyneuropathy, unspecified: Secondary | ICD-10-CM | POA: Diagnosis not present

## 2018-05-16 DIAGNOSIS — L97512 Non-pressure chronic ulcer of other part of right foot with fat layer exposed: Secondary | ICD-10-CM | POA: Diagnosis not present

## 2018-05-16 DIAGNOSIS — L97522 Non-pressure chronic ulcer of other part of left foot with fat layer exposed: Secondary | ICD-10-CM | POA: Diagnosis not present

## 2018-05-16 DIAGNOSIS — R52 Pain, unspecified: Secondary | ICD-10-CM

## 2018-05-16 DIAGNOSIS — M7989 Other specified soft tissue disorders: Secondary | ICD-10-CM | POA: Insufficient documentation

## 2018-05-16 DIAGNOSIS — X58XXXA Exposure to other specified factors, initial encounter: Secondary | ICD-10-CM | POA: Diagnosis not present

## 2018-05-16 DIAGNOSIS — I10 Essential (primary) hypertension: Secondary | ICD-10-CM | POA: Diagnosis not present

## 2018-05-16 DIAGNOSIS — M2060 Acquired deformities of toe(s), unspecified, unspecified foot: Secondary | ICD-10-CM | POA: Diagnosis not present

## 2018-05-16 DIAGNOSIS — S91102A Unspecified open wound of left great toe without damage to nail, initial encounter: Secondary | ICD-10-CM | POA: Insufficient documentation

## 2018-05-16 DIAGNOSIS — I87333 Chronic venous hypertension (idiopathic) with ulcer and inflammation of bilateral lower extremity: Secondary | ICD-10-CM | POA: Diagnosis not present

## 2018-05-16 DIAGNOSIS — Z89411 Acquired absence of right great toe: Secondary | ICD-10-CM | POA: Insufficient documentation

## 2018-05-16 DIAGNOSIS — G603 Idiopathic progressive neuropathy: Secondary | ICD-10-CM | POA: Diagnosis not present

## 2018-05-16 DIAGNOSIS — S91101A Unspecified open wound of right great toe without damage to nail, initial encounter: Secondary | ICD-10-CM | POA: Diagnosis not present

## 2018-05-16 DIAGNOSIS — F17200 Nicotine dependence, unspecified, uncomplicated: Secondary | ICD-10-CM | POA: Diagnosis not present

## 2018-05-16 DIAGNOSIS — J449 Chronic obstructive pulmonary disease, unspecified: Secondary | ICD-10-CM | POA: Diagnosis not present

## 2018-05-16 DIAGNOSIS — I890 Lymphedema, not elsewhere classified: Secondary | ICD-10-CM | POA: Diagnosis not present

## 2018-05-17 ENCOUNTER — Other Ambulatory Visit: Payer: Self-pay | Admitting: Nurse Practitioner

## 2018-05-18 ENCOUNTER — Inpatient Hospital Stay: Admission: RE | Admit: 2018-05-18 | Payer: Medicare PPO | Source: Ambulatory Visit

## 2018-05-23 ENCOUNTER — Ambulatory Visit: Payer: Medicare PPO | Admitting: Neurology

## 2018-05-30 ENCOUNTER — Encounter (HOSPITAL_BASED_OUTPATIENT_CLINIC_OR_DEPARTMENT_OTHER): Payer: Medicare PPO | Attending: Internal Medicine

## 2018-05-30 DIAGNOSIS — L97522 Non-pressure chronic ulcer of other part of left foot with fat layer exposed: Secondary | ICD-10-CM | POA: Diagnosis not present

## 2018-05-30 DIAGNOSIS — G603 Idiopathic progressive neuropathy: Secondary | ICD-10-CM | POA: Diagnosis not present

## 2018-05-30 DIAGNOSIS — I890 Lymphedema, not elsewhere classified: Secondary | ICD-10-CM | POA: Diagnosis not present

## 2018-05-30 DIAGNOSIS — L84 Corns and callosities: Secondary | ICD-10-CM | POA: Diagnosis not present

## 2018-05-30 DIAGNOSIS — L97512 Non-pressure chronic ulcer of other part of right foot with fat layer exposed: Secondary | ICD-10-CM | POA: Diagnosis not present

## 2018-05-30 DIAGNOSIS — L97822 Non-pressure chronic ulcer of other part of left lower leg with fat layer exposed: Secondary | ICD-10-CM | POA: Insufficient documentation

## 2018-05-30 DIAGNOSIS — L97529 Non-pressure chronic ulcer of other part of left foot with unspecified severity: Secondary | ICD-10-CM | POA: Diagnosis not present

## 2018-05-30 DIAGNOSIS — I87333 Chronic venous hypertension (idiopathic) with ulcer and inflammation of bilateral lower extremity: Secondary | ICD-10-CM | POA: Diagnosis not present

## 2018-05-30 DIAGNOSIS — I10 Essential (primary) hypertension: Secondary | ICD-10-CM | POA: Diagnosis not present

## 2018-05-30 DIAGNOSIS — J449 Chronic obstructive pulmonary disease, unspecified: Secondary | ICD-10-CM | POA: Insufficient documentation

## 2018-05-31 ENCOUNTER — Ambulatory Visit (INDEPENDENT_AMBULATORY_CARE_PROVIDER_SITE_OTHER)
Admission: RE | Admit: 2018-05-31 | Discharge: 2018-05-31 | Disposition: A | Discharge status: Home / Self Care | Payer: Medicare PPO | Source: Ambulatory Visit | Attending: Acute Care | Admitting: Acute Care

## 2018-05-31 DIAGNOSIS — F17210 Nicotine dependence, cigarettes, uncomplicated: Secondary | ICD-10-CM | POA: Diagnosis not present

## 2018-05-31 DIAGNOSIS — Z87891 Personal history of nicotine dependence: Secondary | ICD-10-CM | POA: Diagnosis not present

## 2018-06-06 ENCOUNTER — Other Ambulatory Visit: Payer: Self-pay | Admitting: Acute Care

## 2018-06-06 DIAGNOSIS — Z122 Encounter for screening for malignant neoplasm of respiratory organs: Secondary | ICD-10-CM

## 2018-06-06 DIAGNOSIS — F17210 Nicotine dependence, cigarettes, uncomplicated: Secondary | ICD-10-CM

## 2018-06-13 DIAGNOSIS — I10 Essential (primary) hypertension: Secondary | ICD-10-CM | POA: Diagnosis not present

## 2018-06-13 DIAGNOSIS — I890 Lymphedema, not elsewhere classified: Secondary | ICD-10-CM | POA: Diagnosis not present

## 2018-06-13 DIAGNOSIS — L97512 Non-pressure chronic ulcer of other part of right foot with fat layer exposed: Secondary | ICD-10-CM | POA: Diagnosis not present

## 2018-06-13 DIAGNOSIS — I87333 Chronic venous hypertension (idiopathic) with ulcer and inflammation of bilateral lower extremity: Secondary | ICD-10-CM | POA: Diagnosis not present

## 2018-06-13 DIAGNOSIS — L84 Corns and callosities: Secondary | ICD-10-CM | POA: Diagnosis not present

## 2018-06-13 DIAGNOSIS — J449 Chronic obstructive pulmonary disease, unspecified: Secondary | ICD-10-CM | POA: Diagnosis not present

## 2018-06-13 DIAGNOSIS — L97522 Non-pressure chronic ulcer of other part of left foot with fat layer exposed: Secondary | ICD-10-CM | POA: Diagnosis not present

## 2018-06-13 DIAGNOSIS — L97822 Non-pressure chronic ulcer of other part of left lower leg with fat layer exposed: Secondary | ICD-10-CM | POA: Diagnosis not present

## 2018-06-13 DIAGNOSIS — G629 Polyneuropathy, unspecified: Secondary | ICD-10-CM | POA: Diagnosis not present

## 2018-06-13 DIAGNOSIS — G603 Idiopathic progressive neuropathy: Secondary | ICD-10-CM | POA: Diagnosis not present

## 2018-06-26 DIAGNOSIS — C641 Malignant neoplasm of right kidney, except renal pelvis: Secondary | ICD-10-CM | POA: Diagnosis not present

## 2018-06-26 DIAGNOSIS — Z125 Encounter for screening for malignant neoplasm of prostate: Secondary | ICD-10-CM | POA: Diagnosis not present

## 2018-06-27 DIAGNOSIS — K4090 Unilateral inguinal hernia, without obstruction or gangrene, not specified as recurrent: Secondary | ICD-10-CM | POA: Diagnosis not present

## 2018-06-27 DIAGNOSIS — C641 Malignant neoplasm of right kidney, except renal pelvis: Secondary | ICD-10-CM | POA: Diagnosis not present

## 2018-06-29 ENCOUNTER — Encounter (HOSPITAL_BASED_OUTPATIENT_CLINIC_OR_DEPARTMENT_OTHER): Payer: Medicare PPO | Attending: Internal Medicine

## 2018-06-29 DIAGNOSIS — I87333 Chronic venous hypertension (idiopathic) with ulcer and inflammation of bilateral lower extremity: Secondary | ICD-10-CM | POA: Diagnosis not present

## 2018-06-29 DIAGNOSIS — L97522 Non-pressure chronic ulcer of other part of left foot with fat layer exposed: Secondary | ICD-10-CM | POA: Diagnosis not present

## 2018-06-29 DIAGNOSIS — L97812 Non-pressure chronic ulcer of other part of right lower leg with fat layer exposed: Secondary | ICD-10-CM | POA: Diagnosis not present

## 2018-06-29 DIAGNOSIS — L97512 Non-pressure chronic ulcer of other part of right foot with fat layer exposed: Secondary | ICD-10-CM | POA: Diagnosis not present

## 2018-06-29 DIAGNOSIS — I10 Essential (primary) hypertension: Secondary | ICD-10-CM | POA: Insufficient documentation

## 2018-06-29 DIAGNOSIS — F17200 Nicotine dependence, unspecified, uncomplicated: Secondary | ICD-10-CM | POA: Diagnosis not present

## 2018-06-29 DIAGNOSIS — Z89411 Acquired absence of right great toe: Secondary | ICD-10-CM | POA: Diagnosis not present

## 2018-06-29 DIAGNOSIS — J449 Chronic obstructive pulmonary disease, unspecified: Secondary | ICD-10-CM | POA: Diagnosis not present

## 2018-06-29 DIAGNOSIS — G603 Idiopathic progressive neuropathy: Secondary | ICD-10-CM | POA: Insufficient documentation

## 2018-06-29 DIAGNOSIS — L84 Corns and callosities: Secondary | ICD-10-CM | POA: Diagnosis not present

## 2018-06-29 DIAGNOSIS — I890 Lymphedema, not elsewhere classified: Secondary | ICD-10-CM | POA: Insufficient documentation

## 2018-06-30 DIAGNOSIS — N321 Vesicointestinal fistula: Secondary | ICD-10-CM | POA: Diagnosis not present

## 2018-06-30 DIAGNOSIS — C641 Malignant neoplasm of right kidney, except renal pelvis: Secondary | ICD-10-CM | POA: Diagnosis not present

## 2018-07-04 DIAGNOSIS — G603 Idiopathic progressive neuropathy: Secondary | ICD-10-CM | POA: Diagnosis not present

## 2018-07-04 DIAGNOSIS — L97812 Non-pressure chronic ulcer of other part of right lower leg with fat layer exposed: Secondary | ICD-10-CM | POA: Diagnosis not present

## 2018-07-04 DIAGNOSIS — L97522 Non-pressure chronic ulcer of other part of left foot with fat layer exposed: Secondary | ICD-10-CM | POA: Diagnosis not present

## 2018-07-04 DIAGNOSIS — I890 Lymphedema, not elsewhere classified: Secondary | ICD-10-CM | POA: Diagnosis not present

## 2018-07-04 DIAGNOSIS — L84 Corns and callosities: Secondary | ICD-10-CM | POA: Diagnosis not present

## 2018-07-04 DIAGNOSIS — I87333 Chronic venous hypertension (idiopathic) with ulcer and inflammation of bilateral lower extremity: Secondary | ICD-10-CM | POA: Diagnosis not present

## 2018-07-04 DIAGNOSIS — L97512 Non-pressure chronic ulcer of other part of right foot with fat layer exposed: Secondary | ICD-10-CM | POA: Diagnosis not present

## 2018-07-04 DIAGNOSIS — J449 Chronic obstructive pulmonary disease, unspecified: Secondary | ICD-10-CM | POA: Diagnosis not present

## 2018-07-04 DIAGNOSIS — I10 Essential (primary) hypertension: Secondary | ICD-10-CM | POA: Diagnosis not present

## 2018-07-11 DIAGNOSIS — E1159 Type 2 diabetes mellitus with other circulatory complications: Secondary | ICD-10-CM | POA: Diagnosis not present

## 2018-07-11 DIAGNOSIS — E1140 Type 2 diabetes mellitus with diabetic neuropathy, unspecified: Secondary | ICD-10-CM | POA: Diagnosis not present

## 2018-07-11 DIAGNOSIS — C649 Malignant neoplasm of unspecified kidney, except renal pelvis: Secondary | ICD-10-CM | POA: Diagnosis not present

## 2018-07-11 DIAGNOSIS — G608 Other hereditary and idiopathic neuropathies: Secondary | ICD-10-CM | POA: Diagnosis not present

## 2018-07-11 DIAGNOSIS — L97512 Non-pressure chronic ulcer of other part of right foot with fat layer exposed: Secondary | ICD-10-CM | POA: Diagnosis not present

## 2018-07-11 DIAGNOSIS — K632 Fistula of intestine: Secondary | ICD-10-CM | POA: Diagnosis not present

## 2018-07-11 DIAGNOSIS — E1169 Type 2 diabetes mellitus with other specified complication: Secondary | ICD-10-CM | POA: Diagnosis not present

## 2018-07-11 DIAGNOSIS — I7389 Other specified peripheral vascular diseases: Secondary | ICD-10-CM | POA: Diagnosis not present

## 2018-07-11 DIAGNOSIS — L97522 Non-pressure chronic ulcer of other part of left foot with fat layer exposed: Secondary | ICD-10-CM | POA: Diagnosis not present

## 2018-07-12 DIAGNOSIS — G603 Idiopathic progressive neuropathy: Secondary | ICD-10-CM | POA: Diagnosis not present

## 2018-07-12 DIAGNOSIS — J449 Chronic obstructive pulmonary disease, unspecified: Secondary | ICD-10-CM | POA: Diagnosis not present

## 2018-07-12 DIAGNOSIS — L97512 Non-pressure chronic ulcer of other part of right foot with fat layer exposed: Secondary | ICD-10-CM | POA: Diagnosis not present

## 2018-07-12 DIAGNOSIS — L97812 Non-pressure chronic ulcer of other part of right lower leg with fat layer exposed: Secondary | ICD-10-CM | POA: Diagnosis not present

## 2018-07-12 DIAGNOSIS — I87333 Chronic venous hypertension (idiopathic) with ulcer and inflammation of bilateral lower extremity: Secondary | ICD-10-CM | POA: Diagnosis not present

## 2018-07-12 DIAGNOSIS — L97522 Non-pressure chronic ulcer of other part of left foot with fat layer exposed: Secondary | ICD-10-CM | POA: Diagnosis not present

## 2018-07-12 DIAGNOSIS — I10 Essential (primary) hypertension: Secondary | ICD-10-CM | POA: Diagnosis not present

## 2018-07-12 DIAGNOSIS — I890 Lymphedema, not elsewhere classified: Secondary | ICD-10-CM | POA: Diagnosis not present

## 2018-07-12 DIAGNOSIS — L84 Corns and callosities: Secondary | ICD-10-CM | POA: Diagnosis not present

## 2018-07-19 DIAGNOSIS — I87333 Chronic venous hypertension (idiopathic) with ulcer and inflammation of bilateral lower extremity: Secondary | ICD-10-CM | POA: Diagnosis not present

## 2018-07-19 DIAGNOSIS — I890 Lymphedema, not elsewhere classified: Secondary | ICD-10-CM | POA: Diagnosis not present

## 2018-07-19 DIAGNOSIS — I10 Essential (primary) hypertension: Secondary | ICD-10-CM | POA: Diagnosis not present

## 2018-07-19 DIAGNOSIS — L97812 Non-pressure chronic ulcer of other part of right lower leg with fat layer exposed: Secondary | ICD-10-CM | POA: Diagnosis not present

## 2018-07-19 DIAGNOSIS — G603 Idiopathic progressive neuropathy: Secondary | ICD-10-CM | POA: Diagnosis not present

## 2018-07-19 DIAGNOSIS — J449 Chronic obstructive pulmonary disease, unspecified: Secondary | ICD-10-CM | POA: Diagnosis not present

## 2018-07-19 DIAGNOSIS — L84 Corns and callosities: Secondary | ICD-10-CM | POA: Diagnosis not present

## 2018-07-19 DIAGNOSIS — L97512 Non-pressure chronic ulcer of other part of right foot with fat layer exposed: Secondary | ICD-10-CM | POA: Diagnosis not present

## 2018-07-19 DIAGNOSIS — L97522 Non-pressure chronic ulcer of other part of left foot with fat layer exposed: Secondary | ICD-10-CM | POA: Diagnosis not present

## 2018-08-09 ENCOUNTER — Encounter (HOSPITAL_BASED_OUTPATIENT_CLINIC_OR_DEPARTMENT_OTHER): Payer: Medicare PPO | Attending: Physician Assistant

## 2018-08-09 ENCOUNTER — Other Ambulatory Visit: Payer: Self-pay

## 2018-08-09 DIAGNOSIS — G629 Polyneuropathy, unspecified: Secondary | ICD-10-CM | POA: Diagnosis not present

## 2018-08-09 DIAGNOSIS — I87333 Chronic venous hypertension (idiopathic) with ulcer and inflammation of bilateral lower extremity: Secondary | ICD-10-CM | POA: Insufficient documentation

## 2018-08-09 DIAGNOSIS — L97522 Non-pressure chronic ulcer of other part of left foot with fat layer exposed: Secondary | ICD-10-CM | POA: Insufficient documentation

## 2018-08-09 DIAGNOSIS — L97512 Non-pressure chronic ulcer of other part of right foot with fat layer exposed: Secondary | ICD-10-CM | POA: Diagnosis not present

## 2018-08-09 DIAGNOSIS — I10 Essential (primary) hypertension: Secondary | ICD-10-CM | POA: Insufficient documentation

## 2018-08-09 DIAGNOSIS — Z89411 Acquired absence of right great toe: Secondary | ICD-10-CM | POA: Insufficient documentation

## 2018-08-09 DIAGNOSIS — J449 Chronic obstructive pulmonary disease, unspecified: Secondary | ICD-10-CM | POA: Insufficient documentation

## 2018-08-09 DIAGNOSIS — L859 Epidermal thickening, unspecified: Secondary | ICD-10-CM | POA: Diagnosis not present

## 2018-08-09 DIAGNOSIS — Z85528 Personal history of other malignant neoplasm of kidney: Secondary | ICD-10-CM | POA: Diagnosis not present

## 2018-08-09 DIAGNOSIS — L84 Corns and callosities: Secondary | ICD-10-CM | POA: Insufficient documentation

## 2018-08-09 DIAGNOSIS — I890 Lymphedema, not elsewhere classified: Secondary | ICD-10-CM | POA: Diagnosis not present

## 2018-08-09 DIAGNOSIS — G603 Idiopathic progressive neuropathy: Secondary | ICD-10-CM | POA: Insufficient documentation

## 2018-09-15 DIAGNOSIS — E785 Hyperlipidemia, unspecified: Secondary | ICD-10-CM | POA: Diagnosis not present

## 2018-09-15 DIAGNOSIS — Z6839 Body mass index (BMI) 39.0-39.9, adult: Secondary | ICD-10-CM | POA: Diagnosis not present

## 2018-09-15 DIAGNOSIS — Z89422 Acquired absence of other left toe(s): Secondary | ICD-10-CM | POA: Diagnosis not present

## 2018-09-15 DIAGNOSIS — M545 Low back pain: Secondary | ICD-10-CM | POA: Diagnosis not present

## 2018-09-15 DIAGNOSIS — N400 Enlarged prostate without lower urinary tract symptoms: Secondary | ICD-10-CM | POA: Diagnosis not present

## 2018-11-08 DIAGNOSIS — I10 Essential (primary) hypertension: Secondary | ICD-10-CM | POA: Diagnosis not present

## 2018-11-08 DIAGNOSIS — L97512 Non-pressure chronic ulcer of other part of right foot with fat layer exposed: Secondary | ICD-10-CM | POA: Diagnosis not present

## 2018-11-08 DIAGNOSIS — J449 Chronic obstructive pulmonary disease, unspecified: Secondary | ICD-10-CM | POA: Diagnosis not present

## 2018-11-08 DIAGNOSIS — F17200 Nicotine dependence, unspecified, uncomplicated: Secondary | ICD-10-CM | POA: Diagnosis not present

## 2018-11-08 DIAGNOSIS — Z6841 Body Mass Index (BMI) 40.0 and over, adult: Secondary | ICD-10-CM | POA: Diagnosis not present

## 2018-11-08 DIAGNOSIS — L97522 Non-pressure chronic ulcer of other part of left foot with fat layer exposed: Secondary | ICD-10-CM | POA: Diagnosis not present

## 2018-11-08 DIAGNOSIS — E1159 Type 2 diabetes mellitus with other circulatory complications: Secondary | ICD-10-CM | POA: Diagnosis not present

## 2018-11-08 DIAGNOSIS — E1140 Type 2 diabetes mellitus with diabetic neuropathy, unspecified: Secondary | ICD-10-CM | POA: Diagnosis not present

## 2018-12-07 ENCOUNTER — Encounter (HOSPITAL_BASED_OUTPATIENT_CLINIC_OR_DEPARTMENT_OTHER): Payer: Medicare PPO | Attending: Internal Medicine

## 2018-12-07 DIAGNOSIS — J449 Chronic obstructive pulmonary disease, unspecified: Secondary | ICD-10-CM | POA: Insufficient documentation

## 2018-12-07 DIAGNOSIS — L97512 Non-pressure chronic ulcer of other part of right foot with fat layer exposed: Secondary | ICD-10-CM | POA: Insufficient documentation

## 2018-12-07 DIAGNOSIS — G9009 Other idiopathic peripheral autonomic neuropathy: Secondary | ICD-10-CM | POA: Diagnosis not present

## 2018-12-07 DIAGNOSIS — F17210 Nicotine dependence, cigarettes, uncomplicated: Secondary | ICD-10-CM | POA: Insufficient documentation

## 2018-12-07 DIAGNOSIS — L97522 Non-pressure chronic ulcer of other part of left foot with fat layer exposed: Secondary | ICD-10-CM | POA: Diagnosis not present

## 2018-12-07 DIAGNOSIS — I10 Essential (primary) hypertension: Secondary | ICD-10-CM | POA: Insufficient documentation

## 2018-12-07 DIAGNOSIS — Z89411 Acquired absence of right great toe: Secondary | ICD-10-CM | POA: Insufficient documentation

## 2018-12-08 DIAGNOSIS — L97529 Non-pressure chronic ulcer of other part of left foot with unspecified severity: Secondary | ICD-10-CM | POA: Diagnosis not present

## 2018-12-11 ENCOUNTER — Other Ambulatory Visit (HOSPITAL_COMMUNITY): Payer: Self-pay | Admitting: Internal Medicine

## 2018-12-11 ENCOUNTER — Ambulatory Visit (HOSPITAL_COMMUNITY)
Admission: RE | Admit: 2018-12-11 | Discharge: 2018-12-11 | Disposition: A | Discharge status: Home / Self Care | Payer: Medicare PPO | Source: Ambulatory Visit | Attending: Internal Medicine | Admitting: Internal Medicine

## 2018-12-11 DIAGNOSIS — S81801A Unspecified open wound, right lower leg, initial encounter: Secondary | ICD-10-CM

## 2018-12-11 DIAGNOSIS — S81802A Unspecified open wound, left lower leg, initial encounter: Secondary | ICD-10-CM

## 2018-12-11 DIAGNOSIS — S91102A Unspecified open wound of left great toe without damage to nail, initial encounter: Secondary | ICD-10-CM | POA: Diagnosis not present

## 2018-12-11 DIAGNOSIS — S91301A Unspecified open wound, right foot, initial encounter: Secondary | ICD-10-CM | POA: Diagnosis not present

## 2018-12-15 ENCOUNTER — Other Ambulatory Visit (HOSPITAL_BASED_OUTPATIENT_CLINIC_OR_DEPARTMENT_OTHER): Payer: Self-pay | Admitting: Internal Medicine

## 2018-12-15 DIAGNOSIS — M86372 Chronic multifocal osteomyelitis, left ankle and foot: Secondary | ICD-10-CM | POA: Diagnosis not present

## 2018-12-15 DIAGNOSIS — F17210 Nicotine dependence, cigarettes, uncomplicated: Secondary | ICD-10-CM | POA: Diagnosis not present

## 2018-12-15 DIAGNOSIS — L97522 Non-pressure chronic ulcer of other part of left foot with fat layer exposed: Secondary | ICD-10-CM | POA: Diagnosis not present

## 2018-12-15 DIAGNOSIS — Z89411 Acquired absence of right great toe: Secondary | ICD-10-CM | POA: Diagnosis not present

## 2018-12-15 DIAGNOSIS — B078 Other viral warts: Secondary | ICD-10-CM | POA: Diagnosis not present

## 2018-12-15 DIAGNOSIS — I10 Essential (primary) hypertension: Secondary | ICD-10-CM | POA: Diagnosis not present

## 2018-12-15 DIAGNOSIS — L089 Local infection of the skin and subcutaneous tissue, unspecified: Secondary | ICD-10-CM | POA: Diagnosis not present

## 2018-12-15 DIAGNOSIS — J449 Chronic obstructive pulmonary disease, unspecified: Secondary | ICD-10-CM | POA: Diagnosis not present

## 2018-12-15 DIAGNOSIS — G9009 Other idiopathic peripheral autonomic neuropathy: Secondary | ICD-10-CM | POA: Diagnosis not present

## 2018-12-15 DIAGNOSIS — L97512 Non-pressure chronic ulcer of other part of right foot with fat layer exposed: Secondary | ICD-10-CM | POA: Diagnosis not present

## 2018-12-22 DIAGNOSIS — G9009 Other idiopathic peripheral autonomic neuropathy: Secondary | ICD-10-CM | POA: Diagnosis not present

## 2018-12-22 DIAGNOSIS — F17210 Nicotine dependence, cigarettes, uncomplicated: Secondary | ICD-10-CM | POA: Diagnosis not present

## 2018-12-22 DIAGNOSIS — M86672 Other chronic osteomyelitis, left ankle and foot: Secondary | ICD-10-CM | POA: Diagnosis not present

## 2018-12-22 DIAGNOSIS — B079 Viral wart, unspecified: Secondary | ICD-10-CM | POA: Diagnosis not present

## 2018-12-22 DIAGNOSIS — J449 Chronic obstructive pulmonary disease, unspecified: Secondary | ICD-10-CM | POA: Diagnosis not present

## 2018-12-22 DIAGNOSIS — L97522 Non-pressure chronic ulcer of other part of left foot with fat layer exposed: Secondary | ICD-10-CM | POA: Diagnosis not present

## 2018-12-22 DIAGNOSIS — Z89411 Acquired absence of right great toe: Secondary | ICD-10-CM | POA: Diagnosis not present

## 2018-12-22 DIAGNOSIS — L97512 Non-pressure chronic ulcer of other part of right foot with fat layer exposed: Secondary | ICD-10-CM | POA: Diagnosis not present

## 2018-12-22 DIAGNOSIS — I10 Essential (primary) hypertension: Secondary | ICD-10-CM | POA: Diagnosis not present

## 2019-01-01 ENCOUNTER — Encounter (HOSPITAL_BASED_OUTPATIENT_CLINIC_OR_DEPARTMENT_OTHER): Payer: Medicare PPO | Attending: Internal Medicine

## 2019-01-01 DIAGNOSIS — M86372 Chronic multifocal osteomyelitis, left ankle and foot: Secondary | ICD-10-CM | POA: Insufficient documentation

## 2019-01-01 DIAGNOSIS — G9009 Other idiopathic peripheral autonomic neuropathy: Secondary | ICD-10-CM | POA: Diagnosis not present

## 2019-01-01 DIAGNOSIS — J449 Chronic obstructive pulmonary disease, unspecified: Secondary | ICD-10-CM | POA: Diagnosis not present

## 2019-01-01 DIAGNOSIS — I10 Essential (primary) hypertension: Secondary | ICD-10-CM | POA: Insufficient documentation

## 2019-01-01 DIAGNOSIS — M86672 Other chronic osteomyelitis, left ankle and foot: Secondary | ICD-10-CM | POA: Diagnosis not present

## 2019-01-01 DIAGNOSIS — L97522 Non-pressure chronic ulcer of other part of left foot with fat layer exposed: Secondary | ICD-10-CM | POA: Insufficient documentation

## 2019-01-01 DIAGNOSIS — L97512 Non-pressure chronic ulcer of other part of right foot with fat layer exposed: Secondary | ICD-10-CM | POA: Diagnosis not present

## 2019-01-09 DIAGNOSIS — M79662 Pain in left lower leg: Secondary | ICD-10-CM | POA: Diagnosis not present

## 2019-01-09 DIAGNOSIS — L97512 Non-pressure chronic ulcer of other part of right foot with fat layer exposed: Secondary | ICD-10-CM | POA: Diagnosis not present

## 2019-01-09 DIAGNOSIS — G9009 Other idiopathic peripheral autonomic neuropathy: Secondary | ICD-10-CM | POA: Diagnosis not present

## 2019-01-09 DIAGNOSIS — M86372 Chronic multifocal osteomyelitis, left ankle and foot: Secondary | ICD-10-CM | POA: Diagnosis not present

## 2019-01-09 DIAGNOSIS — L97522 Non-pressure chronic ulcer of other part of left foot with fat layer exposed: Secondary | ICD-10-CM | POA: Diagnosis not present

## 2019-01-09 DIAGNOSIS — S91102A Unspecified open wound of left great toe without damage to nail, initial encounter: Secondary | ICD-10-CM | POA: Diagnosis not present

## 2019-01-09 DIAGNOSIS — M79661 Pain in right lower leg: Secondary | ICD-10-CM | POA: Diagnosis not present

## 2019-01-09 DIAGNOSIS — I10 Essential (primary) hypertension: Secondary | ICD-10-CM | POA: Diagnosis not present

## 2019-01-09 DIAGNOSIS — S91301A Unspecified open wound, right foot, initial encounter: Secondary | ICD-10-CM | POA: Diagnosis not present

## 2019-01-09 DIAGNOSIS — J449 Chronic obstructive pulmonary disease, unspecified: Secondary | ICD-10-CM | POA: Diagnosis not present

## 2019-01-23 DIAGNOSIS — D485 Neoplasm of uncertain behavior of skin: Secondary | ICD-10-CM | POA: Diagnosis not present

## 2019-01-30 ENCOUNTER — Encounter (HOSPITAL_BASED_OUTPATIENT_CLINIC_OR_DEPARTMENT_OTHER): Payer: Medicare PPO | Attending: Internal Medicine

## 2019-02-06 DIAGNOSIS — D485 Neoplasm of uncertain behavior of skin: Secondary | ICD-10-CM | POA: Diagnosis not present

## 2019-02-06 DIAGNOSIS — B079 Viral wart, unspecified: Secondary | ICD-10-CM | POA: Diagnosis not present

## 2019-02-26 DIAGNOSIS — L859 Epidermal thickening, unspecified: Secondary | ICD-10-CM | POA: Diagnosis not present

## 2019-02-26 DIAGNOSIS — L851 Acquired keratosis [keratoderma] palmaris et plantaris: Secondary | ICD-10-CM | POA: Diagnosis not present

## 2019-04-09 ENCOUNTER — Emergency Department (HOSPITAL_COMMUNITY)
Admission: EM | Admit: 2019-04-09 | Discharge: 2019-04-09 | Disposition: A | Discharge status: Home / Self Care | Payer: Medicare PPO | Attending: Emergency Medicine | Admitting: Emergency Medicine

## 2019-04-09 ENCOUNTER — Emergency Department (HOSPITAL_COMMUNITY): Payer: Medicare PPO

## 2019-04-09 ENCOUNTER — Other Ambulatory Visit: Payer: Self-pay

## 2019-04-09 ENCOUNTER — Encounter (HOSPITAL_COMMUNITY): Payer: Self-pay | Admitting: Emergency Medicine

## 2019-04-09 DIAGNOSIS — S8992XA Unspecified injury of left lower leg, initial encounter: Secondary | ICD-10-CM | POA: Diagnosis present

## 2019-04-09 DIAGNOSIS — Y999 Unspecified external cause status: Secondary | ICD-10-CM | POA: Diagnosis not present

## 2019-04-09 DIAGNOSIS — Y9301 Activity, walking, marching and hiking: Secondary | ICD-10-CM | POA: Insufficient documentation

## 2019-04-09 DIAGNOSIS — M25562 Pain in left knee: Secondary | ICD-10-CM | POA: Diagnosis not present

## 2019-04-09 DIAGNOSIS — Z79899 Other long term (current) drug therapy: Secondary | ICD-10-CM | POA: Diagnosis not present

## 2019-04-09 DIAGNOSIS — Y929 Unspecified place or not applicable: Secondary | ICD-10-CM | POA: Insufficient documentation

## 2019-04-09 DIAGNOSIS — I10 Essential (primary) hypertension: Secondary | ICD-10-CM | POA: Diagnosis not present

## 2019-04-09 DIAGNOSIS — S72412A Displaced unspecified condyle fracture of lower end of left femur, initial encounter for closed fracture: Secondary | ICD-10-CM

## 2019-04-09 DIAGNOSIS — S72142A Displaced intertrochanteric fracture of left femur, initial encounter for closed fracture: Secondary | ICD-10-CM | POA: Diagnosis not present

## 2019-04-09 DIAGNOSIS — F17210 Nicotine dependence, cigarettes, uncomplicated: Secondary | ICD-10-CM | POA: Insufficient documentation

## 2019-04-09 DIAGNOSIS — Z7982 Long term (current) use of aspirin: Secondary | ICD-10-CM | POA: Insufficient documentation

## 2019-04-09 DIAGNOSIS — W19XXXA Unspecified fall, initial encounter: Secondary | ICD-10-CM

## 2019-04-09 DIAGNOSIS — X501XXA Overexertion from prolonged static or awkward postures, initial encounter: Secondary | ICD-10-CM | POA: Diagnosis not present

## 2019-04-09 DIAGNOSIS — M25462 Effusion, left knee: Secondary | ICD-10-CM | POA: Diagnosis not present

## 2019-04-09 MED ORDER — OXYCODONE-ACETAMINOPHEN 5-325 MG PO TABS: 1.0000 | ORAL_TABLET | Freq: Four times a day (QID) | ORAL | 0 refills | Status: AC | PRN

## 2019-04-09 MED ORDER — OXYCODONE-ACETAMINOPHEN 5-325 MG PO TABS
2.0000 | ORAL_TABLET | Freq: Once | ORAL | Status: AC
  Administered 2019-04-09: 17:00:00 2 via ORAL
  Filled 2019-04-09: qty 2

## 2019-04-09 MED ORDER — HYDROCODONE-ACETAMINOPHEN 5-325 MG PO TABS: 2.0000 | ORAL_TABLET | ORAL | 0 refills | Status: DC | PRN

## 2019-04-09 MED ORDER — MORPHINE SULFATE (PF) 4 MG/ML IV SOLN
4.0000 mg | Freq: Once | INTRAVENOUS | Status: AC
  Administered 2019-04-09: 15:00:00 4 mg via INTRAVENOUS
  Filled 2019-04-09: qty 1

## 2019-04-09 MED ORDER — MORPHINE SULFATE (PF) 4 MG/ML IV SOLN
4.0000 mg | Freq: Once | INTRAVENOUS | Status: AC
  Administered 2019-04-09: 14:00:00 4 mg via INTRAMUSCULAR
  Filled 2019-04-09: qty 1

## 2019-04-09 NOTE — ED Provider Notes (Signed)
16:00: Assumed care of patient from Rogers PA-C at change of shift pending CT L knee wo contrast.   Please see prior provider note for full H&P.  Briefly patient here with mechanical fall, isolated injury to the left knee, NVI distally.  X-ray with questionable findings, therefore proceed with CT.  Results for orders placed or performed during the hospital encounter of 87/56/43  Basic metabolic panel  Result Value Ref Range   Sodium 134 (L) 135 - 145 mmol/L   Potassium 4.6 3.5 - 5.1 mmol/L   Chloride 100 (L) 101 - 111 mmol/L   CO2 24 22 - 32 mmol/L   Glucose, Bld 179 (H) 65 - 99 mg/dL   BUN 16 6 - 20 mg/dL   Creatinine, Ser 1.40 (H) 0.61 - 1.24 mg/dL   Calcium 8.5 (L) 8.9 - 10.3 mg/dL   GFR calc non Af Amer 53 (L) >60 mL/min   GFR calc Af Amer >60 >60 mL/min   Anion gap 10 5 - 15  CBC  Result Value Ref Range   WBC 12.9 (H) 4.0 - 10.5 K/uL   RBC 4.42 4.22 - 5.81 MIL/uL   Hemoglobin 13.4 13.0 - 17.0 g/dL   HCT 41.0 39.0 - 52.0 %   MCV 92.8 78.0 - 100.0 fL   MCH 30.3 26.0 - 34.0 pg   MCHC 32.7 30.0 - 36.0 g/dL   RDW 13.5 11.5 - 15.5 %   Platelets 272 150 - 400 K/uL  Creatinine, fluid (JP Drainage)  Result Value Ref Range   Creat, Fluid 1.5 mg/dL   Fluid Type-FCRE JP DRAINAGE   Basic metabolic panel  Result Value Ref Range   Sodium 139 135 - 145 mmol/L   Potassium 4.8 3.5 - 5.1 mmol/L   Chloride 105 101 - 111 mmol/L   CO2 29 22 - 32 mmol/L   Glucose, Bld 128 (H) 65 - 99 mg/dL   BUN 21 (H) 6 - 20 mg/dL   Creatinine, Ser 1.30 (H) 0.61 - 1.24 mg/dL   Calcium 8.7 (L) 8.9 - 10.3 mg/dL   GFR calc non Af Amer 58 (L) >60 mL/min   GFR calc Af Amer >60 >60 mL/min   Anion gap 5 5 - 15  CBC  Result Value Ref Range   WBC 15.1 (H) 4.0 - 10.5 K/uL   RBC 4.09 (L) 4.22 - 5.81 MIL/uL   Hemoglobin 12.4 (L) 13.0 - 17.0 g/dL   HCT 37.6 (L) 39.0 - 52.0 %   MCV 91.9 78.0 - 100.0 fL   MCH 30.3 26.0 - 34.0 pg   MCHC 33.0 30.0 - 36.0 g/dL   RDW 13.7 11.5 - 15.5 %   Platelets 232 150  - 400 K/uL  Basic metabolic panel  Result Value Ref Range   Sodium 138 135 - 145 mmol/L   Potassium 4.2 3.5 - 5.1 mmol/L   Chloride 104 101 - 111 mmol/L   CO2 27 22 - 32 mmol/L   Glucose, Bld 96 65 - 99 mg/dL   BUN 18 6 - 20 mg/dL   Creatinine, Ser 1.29 (H) 0.61 - 1.24 mg/dL   Calcium 8.3 (L) 8.9 - 10.3 mg/dL   GFR calc non Af Amer 59 (L) >60 mL/min   GFR calc Af Amer >60 >60 mL/min   Anion gap 7 5 - 15  CBC  Result Value Ref Range   WBC 12.4 (H) 4.0 - 10.5 K/uL   RBC 4.16 (L) 4.22 - 5.81 MIL/uL  Hemoglobin 12.5 (L) 13.0 - 17.0 g/dL   HCT 38.4 (L) 39.0 - 52.0 %   MCV 92.3 78.0 - 100.0 fL   MCH 30.0 26.0 - 34.0 pg   MCHC 32.6 30.0 - 36.0 g/dL   RDW 13.8 11.5 - 15.5 %   Platelets 223 150 - 400 K/uL   Ct Knee Left Wo Contrast  Result Date: 04/09/2019 CLINICAL DATA:  Fall. Possible lateral femoral condyle fracture on x-ray. EXAM: CT OF THE LEFT KNEE WITHOUT CONTRAST TECHNIQUE: Multidetector CT imaging of the left knee was performed according to the standard protocol. Multiplanar CT image reconstructions were also generated. COMPARISON:  Left knee x-rays from same day. FINDINGS: Bones/Joint/Cartilage Acute avulsion fracture of the posterior tibial spine at the PCL attachment. Acute comminuted, minimally displaced, predominantly longitudinal fracture of the lateral femoral condyle/epicondyle. No dislocation. Mild medial compartment joint space narrowing with subchondral sclerosis, cystic change, and small marginal osteophytes. Small bone infarct in the proximal tibial metaphysis. Osteopenia. Moderate hemarthrosis. Small Baker cyst containing a fluid fluid level. Ligaments Suboptimally assessed by CT. Muscles and Tendons Grossly intact. Soft tissues Mild soft tissue swelling along the laterally. No soft tissue mass or fluid collection. IMPRESSION: 1. Acute comminuted minimally displaced fracture of the lateral femoral condyle/epicondyle. 2. Acute avulsion fracture of the posterior tibial spine  at the PCL attachment. 3. Moderate hemarthrosis. 4. Mild medial compartment osteoarthritis. Electronically Signed   By: Titus Dubin M.D.   On: 04/09/2019 16:07   Dg Knee Complete 4 Views Left  Result Date: 04/09/2019 CLINICAL DATA:  [place] today with lateral knee pain. EXAM: LEFT KNEE - COMPLETE 4+ VIEW COMPARISON:  None. FINDINGS: Moderate to large knee joint effusion. Medial compartment osteoarthritis with joint space narrowing and marginal osteophytes. Question of nondisplaced fracture of the lateral aspect of the lateral femoral condyle. Benign sclerotic change of the proximal tibial medullary space. IMPRESSION: Joint effusion. Question nondisplaced fracture of the lateral aspect of the lateral femoral condyle. I am not convinced this is definite. Medial compartment osteoarthritis. Consider CT for confirmation. Electronically Signed   By: Nelson Chimes M.D.   On: 04/09/2019 12:34    CT read as above. 16:53: CONSULT: Discussed with orthopedic surgeon Dr. Wynelle Link- in agreement w/ knee immobilizer and discharge home w/ follow up in clinic.   SPLINT APPLICATION Date/Time: 0:98 PM Authorized by: Kennith Maes Consent: Verbal consent obtained. Risks and benefits: risks, benefits and alternatives were discussed Consent given by: patient Splint applied by: orthopedic technician Location details: LLE Splint type: knee immobilizer Supplies used: knee immobilizer.  Post-procedure: The splinted body part was neurovascularly unchanged following the procedure. Patient tolerance: Patient tolerated the procedure well with no immediate complications.  Plan for discharge home with knee immobilizer and crutches with nonweightbearing status.  Will provide Percocet to help with severe pain. [place] Controlled Substance reporting System queried. Discussed w/ Mitzi Hansen @ [facility] pharmacy to discontinue previously prescribed Norco by prior provider as I did not realize this had already been prescribed,  Norco was discontinued to not have two narcotics prescriptions. I discussed results, treatment plan, need for follow-up, and return precautions with the patient. Provided opportunity for questions, patient confirmed understanding and is in agreement with plan.   Findings and plan of care discussed with supervising physician Dr. Regenia Skeeter who is in agreement.      Amaryllis Dyke, PA-C 04/09/19 1706    Sherwood Gambler, MD 04/12/19 1623

## 2019-04-09 NOTE — Discharge Instructions (Addendum)
Please read and follow all provided instructions.  You have been seen today for a fall your CT scan showed a fracture in your knee.  1. Acute comminuted minimally displaced fracture of the lateral femoral condyle/epicondyle. 2. Acute avulsion fracture of the posterior tibial spine at the PCL attachment. 3. Moderate hemarthrosis. 4. Mild medial compartment osteoarthritis.  We have placed you in a knee immobilizer and given you crutches- remain in the knee immobilizer at all times and do not put weight on the left leg until you have followed up with orthopedics. Call tomorrow for an appointment within the next 1 week.   Home care instructions: -- *PRICE in the first 24-48 hours after injury: Protect (with brace, splint, sling), if given by your provider Rest Ice- Do not apply ice pack directly to your skin, place towel or similar between your skin and ice/ice pack. Apply ice for 20 min, then remove for 40 min while awake Compression- Wear brace, elastic bandage, splint as directed by your provider Elevate affected extremity above the level of your heart when not walking around for the first 24-48 hours   Medications:  -Percocet-this is a narcotic/controlled substance medication that has potential addicting qualities.  We recommend that you take 1-2 tablets every 6 hours as needed for severe pain.  Do not drive or operate heavy machinery when taking this medicine as it can be sedating. Do not drink alcohol or take other sedating medications when taking this medicine for safety reasons.  Keep this out of reach of small children.  Please be aware this medicine has Tylenol in it (325 mg/tab) do not exceed the maximum dose of Tylenol in a day per over the counter recommendations should you decide to supplement with Tylenol over the counter.   We have prescribed you new medication(s) today. Discuss the medications prescribed today with your pharmacist as they can have adverse effects and interactions with  your other medicines including over the counter and prescribed medications. Seek medical evaluation if you start to experience new or abnormal symptoms after taking one of these medicines, seek care immediately if you start to experience difficulty breathing, feeling of your throat closing, facial swelling, or rash as these could be indications of a more serious allergic reaction  Follow-up instructions: Please follow-up with orthopedics as discussed above.    Return instructions:  Please return if your digits or extremity are numb or tingling, appear gray or blue, or you have severe pain (also elevate the extremity and loosen splint or wrap if you were given one) Please return if you have redness or fevers.  Please return to the Emergency Department if you experience worsening symptoms.  Please return if you have any other emergent concerns. Additional Information:  Your vital signs today were: BP (!) 117/105    Pulse 78    Temp 97.8 F (36.6 C) (Oral)    Resp 15    SpO2 92%  If your blood pressure (BP) was elevated above 135/85 this visit, please have this repeated by your doctor within one month.

## 2019-04-09 NOTE — ED Provider Notes (Signed)
[facility] DEPT Provider Note   CSN: [id number] Arrival date & time: 04/09/19  1134    History   Chief Complaint Chief Complaint  Patient presents with  . Fall  . Knee Pain    left    HPI [name]. is a 63 y.o. male.     HPI   Patient is a 63 year old male with a history of arthritis, CKD, COPD, kidney stones, hyperlipidemia, hypertension, neuropathy, who presents to the emergency department today for evaluation of left knee pain.  Patient states that he was walking prior to arrival when he stepped wrong and twisted his knee and fell onto it.  He denies any other injuries.  Denies head trauma or LOC.  Is not on blood thinners.  Notes pain to the lateral aspect of the left knee.  Pain is severe and constant in nature.  Is worse with ambulation and movement.  On evaluation, patient has chronic wounds to the bilateral feet.  He assures me that these wounds are chronic and unchanged from prior.  He does have follow-up with vascular surgery planned.  No fevers.  Past Medical History:  Diagnosis Date  . Arthritis   . Chronic kidney disease    mass r kidney   . COPD (chronic obstructive pulmonary disease) ([place])   . History of kidney stones   . Hx of adenomatous colonic polyps 06/09/2015  . Hyperlipidemia   . Hypertension    has previously been on medication but then taken off  . Neuromuscular disorder (HCC)    neuropathy lower legs and feet  . Neuropathy    in both feet  . Pneumonia 2011    Patient Active Problem List   Diagnosis Date Noted  . Skin ulcers of both feet ([place]) 10/24/2017  . Colovesical fistula   . Benign neoplasm of cecum   . Chronic venous insufficiency 07/20/2017  . Chronic left-sided low back pain without sciatica 11/08/2016  . Toe ulcer ([place]) 03/08/2016  . Verruca 12/28/2015  . Pre-ulcerative calluses 12/28/2015  . History of adenomatous polyp of colon 06/09/2015  . Lipoma 03/13/2014  . Plantar warts  07/09/2012  . Cellulitis 07/04/2012  . Neuropathy 07/04/2012  . Hyperglycemia 07/04/2012  . HYPERTENSION, BENIGN 10/20/2010  . EDEMA 08/20/2010  . WISDOM TEETH EXTRACTION, HX OF 08/20/2010  . HYPOALBUMINEMIA 08/19/2010  . ANEMIA 08/19/2010  . NICOTINE ADDICTION 08/19/2010  . NECROTIZING PNEUMONIA 08/19/2010  . OSTEOARTHRITIS 08/19/2010    Past Surgical History:  Procedure Laterality Date  . AMPUTATION Bilateral 06/05/2013   Procedure: RIGHT GREAT TOE AMPUTATION/LEFT GREAT TOE DEBRIDEMENT;  Surgeon: Wylene Simmer, MD;  Location: [facility];  Service: Orthopedics;  Laterality: Bilateral;  . COLON SURGERY     robotic partial colectomy Dr. Marcello Moores 12-30-17  . COLONOSCOPY     x 2 last date 09/21/2017 bladder attached to colon eval  . COLONOSCOPY WITH PROPOFOL N/A 09/21/2017   Procedure: COLONOSCOPY WITH PROPOFOL;  Surgeon: [name], MD;  Location: [facility];  Service: Endoscopy;  Laterality: N/A;  . MOUTH SURGERY    . PARTIAL NEPHRECTOMY     right Dr. Leighton Ruff 05-02-32  . ROBOTIC ASSITED PARTIAL NEPHRECTOMY Right 12/30/2017   Procedure: XI ROBOTIC ASSITED RIGHT PARTIAL NEPHRECTOMY;  Surgeon: Alexis Frock, MD;  Location: [facility];  Service: Urology;  Laterality: Right;  . TOE AMPUTATION Right 06/05/2013   RIGHT GREAT TOE PARTIAL AMPUTATION    . TOOTH EXTRACTION  2011   multiple teeth extracted  Home Medications    Prior to Admission medications   Medication Sig Start Date End Date Taking? Authorizing Provider  aspirin 81 MG chewable tablet Chew 81 mg by mouth daily.   Yes [provider]  atorvastatin (LIPITOR) 10 MG tablet Take 10 mg by mouth daily.  05/28/16  Yes [provider]  b complex vitamins tablet Take 1 tablet by mouth daily.   Yes [provider]  collagenase (SANTYL) ointment Apply 1 application topically daily.   Yes [provider]  DULoxetine (CYMBALTA) 30 MG capsule Take 30 mg by mouth daily. Takes with 60mg  capsule for  a total of 90mg  01/29/19  Yes [provider]  DULoxetine (CYMBALTA) 60 MG capsule Take 60 mg by mouth daily. Takes with 30mg  capsule for a total of 90mg  02/10/19  Yes [provider]  pregabalin (LYRICA) 150 MG capsule Take 1 capsule (150 mg total) by mouth 3 (three) times daily. 11/16/17  Yes Dennie Bible, NP  PROAIR HFA 108 [phone] Base) MCG/ACT inhaler Inhale 2 puffs into the lungs every 6 (six) hours as needed (for wheezing/shortness of breath.).  01/27/16  Yes [provider]  SYMBICORT 160-4.5 MCG/ACT inhaler Inhale 2 puffs into the lungs 2 (two) times daily.  06/07/16  Yes [provider]  tamsulosin (FLOMAX) 0.4 MG CAPS capsule Take 0.4 mg by mouth 2 (two) times daily.  08/09/17  Yes [provider]  Turmeric 500 MG CAPS Take 500 mg by mouth daily.   Yes [provider]  varenicline (CHANTIX) 1 MG tablet Take 1 mg by mouth 2 (two) times daily.   Yes [provider]  folic acid (FOLVITE) 585 MCG tablet Take 800 mcg by mouth daily.    [provider]  traMADol (ULTRAM) 50 MG tablet Take 1-2 tablets (50-100 mg total) by mouth every 6 (six) hours as needed (mild pain). Patient not taking: Reported on 04/09/2019 01/05/77   Leighton Ruff, MD    Family History Family History  Problem Relation Age of Onset  . Heart disease Mother   . Esophageal cancer Mother   . Diabetes Father   . Pancreatic cancer Father   . Breast cancer Sister   . Colon cancer Neg Hx   . Rectal cancer Neg Hx   . Stomach cancer Neg Hx   . Colon polyps Neg Hx     Social History Social History   Tobacco Use  . Smoking status: Current Some Day Smoker    Packs/day: 0.10    Years: 20.00    Pack years: 2.00    Types: Cigarettes    Start date: 11/29/1973  . Smokeless tobacco: Never Used  . Tobacco comment: occasional smoker - ~1 pack/week. smoked ~1.5ppd until 5 yr ago  Substance Use Topics  . Alcohol use: No    Alcohol/week: 0.0 standard drinks   . Drug use: No     Allergies   Patient has no known allergies.   Review of Systems Review of Systems  Constitutional: Negative for fever.  HENT: Negative for ear pain and sore throat.   Eyes: Negative for visual disturbance.  Respiratory: Negative for cough and shortness of breath.   Cardiovascular: Negative for chest pain.  Gastrointestinal: Negative for abdominal pain, constipation, diarrhea, nausea and vomiting.  Genitourinary: Negative for dysuria and hematuria.  Musculoskeletal: Negative for back pain.       Left knee pain  Skin: Positive for wound.  Neurological: Negative for headaches.  No head trauma or loc  All other systems reviewed and are negative.    Physical Exam Updated Vital Signs BP (!) 117/105   Pulse 80   Temp 97.8 F (36.6 C) (Oral)   Resp 15   SpO2 95%   Physical Exam Vitals signs and nursing note reviewed.  Constitutional:      Appearance: He is well-developed.  HENT:     Head: Normocephalic and atraumatic.  Eyes:     Conjunctiva/sclera: Conjunctivae normal.  Neck:     Musculoskeletal: Neck supple.  Cardiovascular:     Rate and Rhythm: Normal rate and regular rhythm.     Heart sounds: Normal heart sounds. No murmur.  Pulmonary:     Effort: Pulmonary effort is normal. No respiratory distress.     Breath sounds: Normal breath sounds.  Abdominal:     General: Bowel sounds are normal.     Palpations: Abdomen is soft.     Tenderness: There is no abdominal tenderness.  Musculoskeletal:     Comments: TTP to the lateral joint line of the left knee and to the proximal fibula. Swelling noted to the left knee. Mild TTP to the medial aspect of the knee. Chronic open wounds to bilat feet (unchanged per patient). DP pulses at 2+ bilat. Normal sensation.  Skin:    General: Skin is warm and dry.  Neurological:     Mental Status: He is alert.      ED Treatments / Results  Labs (all labs ordered are listed, but only abnormal results are  displayed) Labs Reviewed - No data to display  EKG None  Radiology Dg Knee Complete 4 Views Left  Result Date: 04/09/2019 CLINICAL DATA:  [place] today with lateral knee pain. EXAM: LEFT KNEE - COMPLETE 4+ VIEW COMPARISON:  None. FINDINGS: Moderate to large knee joint effusion. Medial compartment osteoarthritis with joint space narrowing and marginal osteophytes. Question of nondisplaced fracture of the lateral aspect of the lateral femoral condyle. Benign sclerotic change of the proximal tibial medullary space. IMPRESSION: Joint effusion. Question nondisplaced fracture of the lateral aspect of the lateral femoral condyle. I am not convinced this is definite. Medial compartment osteoarthritis. Consider CT for confirmation. Electronically Signed   By: Nelson Chimes M.D.   On: 04/09/2019 12:34    Procedures Procedures (including critical care time)  Medications Ordered in ED Medications  morphine 4 MG/ML injection 4 mg (4 mg Intramuscular Given 04/09/19 1355)  morphine 4 MG/ML injection 4 mg (4 mg Intravenous Given 04/09/19 1515)     Initial Impression / Assessment and Plan / ED Course  I have reviewed the triage vital signs and the nursing notes.  Pertinent labs & imaging results that were available during my care of the patient were reviewed by me and considered in my medical decision making (see chart for details).     Final Clinical Impressions(s) / ED Diagnoses   Final diagnoses:  Fall, initial encounter  Acute pain of left knee   Patient is a 63 year old male who presents to the emergency department today for evaluation of left knee pain.  Patient states that he was walking prior to arrival when he stepped wrong and twisted his knee and fell onto it.  He denies any other injuries.  Denies head trauma or LOC.  Is not on blood thinners.  Notes pain to the lateral aspect of the left knee.  Pain is severe and constant in nature.  Is worse with ambulation and movement.  TTP to  the  lateral joint line of the left knee and to the proximal fibula. Swelling noted to the left knee. Mild TTP to the medial aspect of the knee. Chronic open wounds to bilat feet (unchanged per patient). DP pulses at 2+ bilat. Normal sensation.  Xray of the left knee with joint effusion. Questionable nondisplaced fracture of the lateral aspect of the lateral femoral condyle. Medial compartment osteoarthritis. Consider CT for confirmation.   CT of the left knee pending at shift change  With regards to pts chronic feet wounds, he assures that these are chronic and unchanged from prior. He states that he does have follow-up with vascular surgery planned.  No fevers. Have given him strict instructions to f/u closely with vascular surgery and return if wounds progress.   Dr. Venora Maples personally evaluated the pt with myself and is in agreement with this plan.  Care signed out to [facility], PA-C at shift change with plan to f/u on pending CT imaging and make appropriate disposition decision.  ED Discharge Orders    None       Bishop Dublin 04/09/19 [place], MD 04/10/19 [phone]

## 2019-04-09 NOTE — ED Triage Notes (Signed)
Pt reports about an hour ago twisted his left knee when he fell.

## 2019-04-12 DIAGNOSIS — W19XXXA Unspecified fall, initial encounter: Secondary | ICD-10-CM | POA: Diagnosis not present

## 2019-04-12 DIAGNOSIS — M25562 Pain in left knee: Secondary | ICD-10-CM | POA: Diagnosis not present

## 2019-04-13 DIAGNOSIS — M25562 Pain in left knee: Secondary | ICD-10-CM | POA: Diagnosis not present

## 2019-05-03 DIAGNOSIS — M25362 Other instability, left knee: Secondary | ICD-10-CM | POA: Diagnosis not present

## 2019-05-03 DIAGNOSIS — M25562 Pain in left knee: Secondary | ICD-10-CM | POA: Diagnosis not present

## 2019-05-07 DIAGNOSIS — E1159 Type 2 diabetes mellitus with other circulatory complications: Secondary | ICD-10-CM | POA: Diagnosis not present

## 2019-05-07 DIAGNOSIS — E7849 Other hyperlipidemia: Secondary | ICD-10-CM | POA: Diagnosis not present

## 2019-05-07 DIAGNOSIS — Z125 Encounter for screening for malignant neoplasm of prostate: Secondary | ICD-10-CM | POA: Diagnosis not present

## 2019-05-10 DIAGNOSIS — M25562 Pain in left knee: Secondary | ICD-10-CM | POA: Diagnosis not present

## 2019-05-11 DIAGNOSIS — E7849 Other hyperlipidemia: Secondary | ICD-10-CM | POA: Diagnosis not present

## 2019-05-11 DIAGNOSIS — I2510 Atherosclerotic heart disease of native coronary artery without angina pectoris: Secondary | ICD-10-CM | POA: Diagnosis not present

## 2019-05-11 DIAGNOSIS — R918 Other nonspecific abnormal finding of lung field: Secondary | ICD-10-CM | POA: Diagnosis not present

## 2019-05-11 DIAGNOSIS — Z1331 Encounter for screening for depression: Secondary | ICD-10-CM | POA: Diagnosis not present

## 2019-05-11 DIAGNOSIS — E1159 Type 2 diabetes mellitus with other circulatory complications: Secondary | ICD-10-CM | POA: Diagnosis not present

## 2019-05-11 DIAGNOSIS — F17200 Nicotine dependence, unspecified, uncomplicated: Secondary | ICD-10-CM | POA: Diagnosis not present

## 2019-05-11 DIAGNOSIS — I70235 Atherosclerosis of native arteries of right leg with ulceration of other part of foot: Secondary | ICD-10-CM | POA: Diagnosis not present

## 2019-05-11 DIAGNOSIS — R809 Proteinuria, unspecified: Secondary | ICD-10-CM | POA: Diagnosis not present

## 2019-05-11 DIAGNOSIS — Z0000 Encounter for general adult medical examination without abnormal findings: Secondary | ICD-10-CM | POA: Diagnosis not present

## 2019-05-24 DIAGNOSIS — M25562 Pain in left knee: Secondary | ICD-10-CM | POA: Diagnosis not present

## 2019-06-18 DIAGNOSIS — C641 Malignant neoplasm of right kidney, except renal pelvis: Secondary | ICD-10-CM | POA: Diagnosis not present

## 2019-06-22 DIAGNOSIS — M25562 Pain in left knee: Secondary | ICD-10-CM | POA: Diagnosis not present

## 2019-06-29 DIAGNOSIS — C641 Malignant neoplasm of right kidney, except renal pelvis: Secondary | ICD-10-CM | POA: Diagnosis not present

## 2019-06-29 DIAGNOSIS — N3289 Other specified disorders of bladder: Secondary | ICD-10-CM | POA: Diagnosis not present

## 2019-07-09 DIAGNOSIS — R3915 Urgency of urination: Secondary | ICD-10-CM | POA: Diagnosis not present

## 2019-07-09 DIAGNOSIS — C641 Malignant neoplasm of right kidney, except renal pelvis: Secondary | ICD-10-CM | POA: Diagnosis not present

## 2019-07-09 DIAGNOSIS — N321 Vesicointestinal fistula: Secondary | ICD-10-CM | POA: Diagnosis not present

## 2019-07-09 DIAGNOSIS — N5201 Erectile dysfunction due to arterial insufficiency: Secondary | ICD-10-CM | POA: Diagnosis not present

## 2019-07-20 DIAGNOSIS — M25561 Pain in right knee: Secondary | ICD-10-CM | POA: Diagnosis not present

## 2019-07-27 DIAGNOSIS — Z23 Encounter for immunization: Secondary | ICD-10-CM | POA: Diagnosis not present

## 2019-08-02 ENCOUNTER — Other Ambulatory Visit: Payer: Self-pay

## 2019-08-02 ENCOUNTER — Ambulatory Visit (INDEPENDENT_AMBULATORY_CARE_PROVIDER_SITE_OTHER)
Admission: RE | Admit: 2019-08-02 | Discharge: 2019-08-02 | Disposition: A | Discharge status: Home / Self Care | Payer: Medicare PPO | Source: Ambulatory Visit | Attending: Acute Care | Admitting: Acute Care

## 2019-08-02 DIAGNOSIS — M25562 Pain in left knee: Secondary | ICD-10-CM | POA: Diagnosis not present

## 2019-08-02 DIAGNOSIS — F17210 Nicotine dependence, cigarettes, uncomplicated: Secondary | ICD-10-CM

## 2019-08-02 DIAGNOSIS — Z122 Encounter for screening for malignant neoplasm of respiratory organs: Secondary | ICD-10-CM

## 2019-08-03 ENCOUNTER — Other Ambulatory Visit: Payer: Self-pay | Admitting: *Deleted

## 2019-08-03 DIAGNOSIS — Z87891 Personal history of nicotine dependence: Secondary | ICD-10-CM

## 2019-08-03 DIAGNOSIS — Z122 Encounter for screening for malignant neoplasm of respiratory organs: Secondary | ICD-10-CM

## 2019-08-03 DIAGNOSIS — F17210 Nicotine dependence, cigarettes, uncomplicated: Secondary | ICD-10-CM

## 2019-08-04 IMAGING — MR MR FOOT*L* WO/W CM
7 of 9 series · 29 of 40 positions shown · IV contrast (Multihance 20cc)
Comparison: Plain films left foot 06/22/2016.

CLINICAL DATA: Chronic wound on the left great toe.

EXAM:
MRI OF THE LEFT FOREFOOT WITHOUT AND WITH CONTRAST
TECHNIQUE: Multiplanar, multisequence MR imaging of the left forefoot was
performed both before and after administration of intravenous
contrast.
CONTRAST:  20 ml MULTIHANCE GADOBENATE DIMEGLUMINE 529 MG/ML IV SOLN

[Series 3: ti short axis · coronal · left · 4.0mm · 0.31mm/px · 1 of 30 slices shown]
[im 1/30]
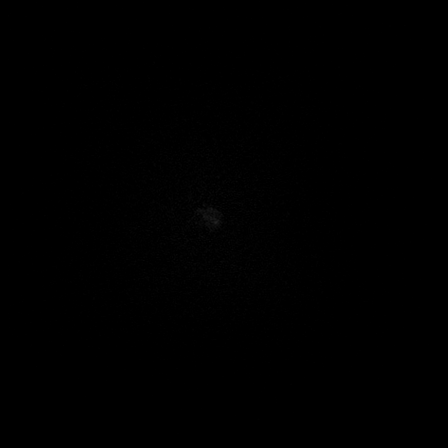

[Series 4: T1 fat-sat · coronal · left · 4.0mm · 0.31mm/px · 5 of 30 slices shown (1 of 3)]
[im 1/30]
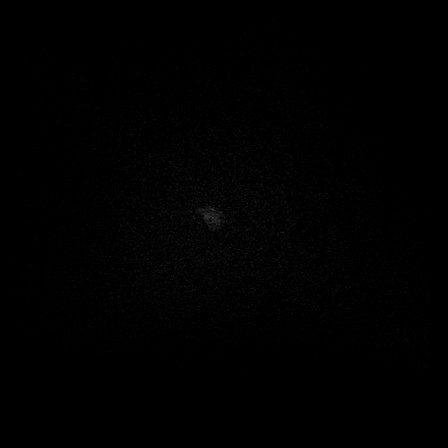
[im 8/30]
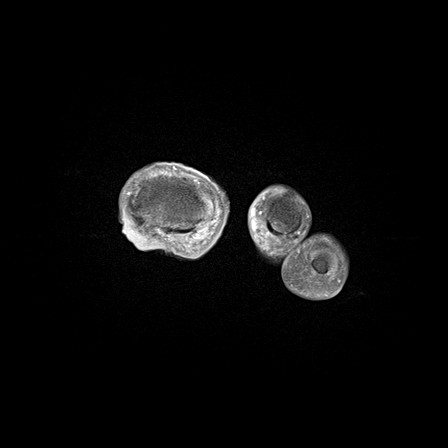
[im 15/30]
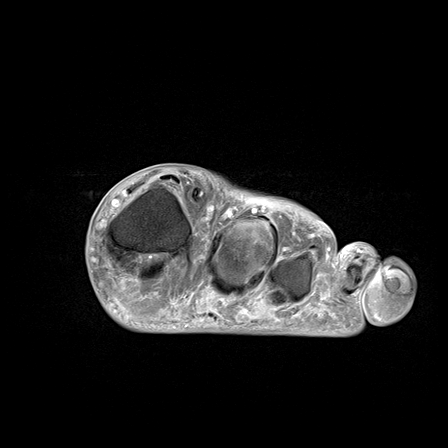
[im 22/30]
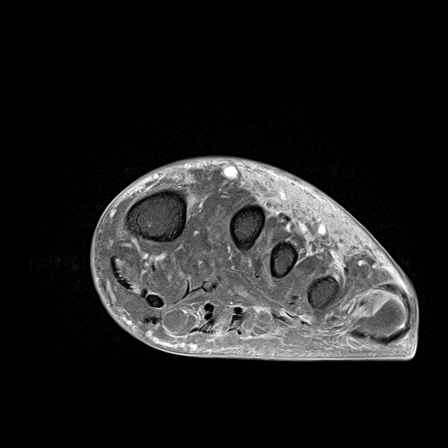
[im 30/30]
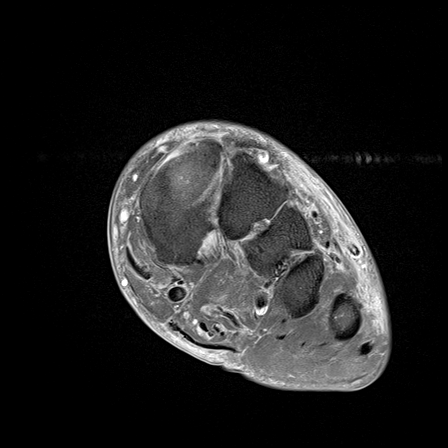

[Series 5: T2 fat-sat · coronal · left · 4.0mm · 0.39mm/px · 5 of 30 slices shown]
[im 1/30]
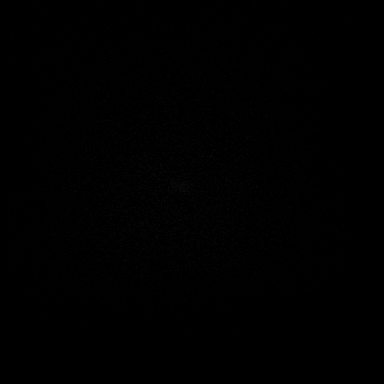
[im 8/30]
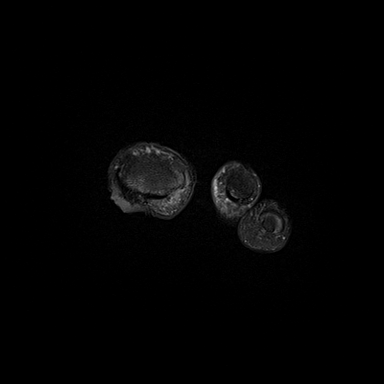
[im 15/30]
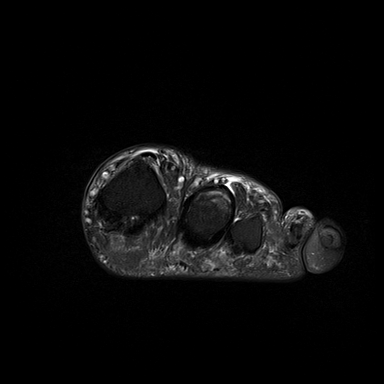
[im 22/30]
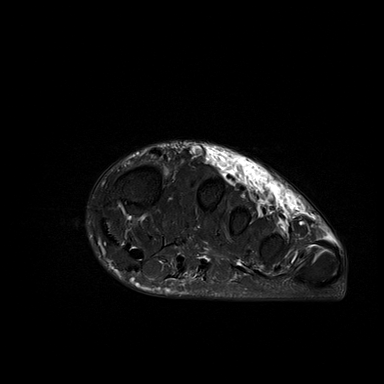
[im 30/30]
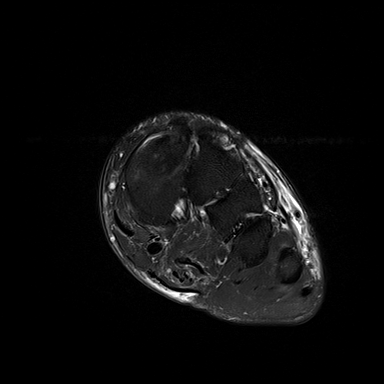

[Series 6: T1 · axial · left · 3.0mm · 0.40mm/px · z∈[-54,+12]mm · 4 of 20 slices shown]
[im 1/20]
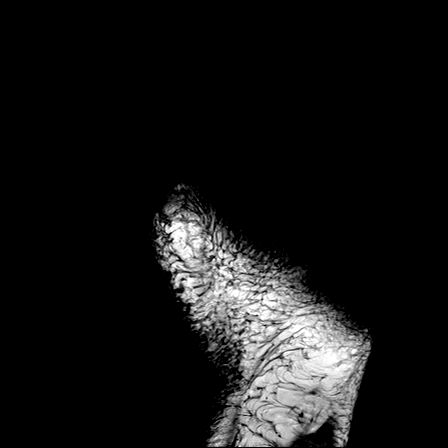
[im 7/20]
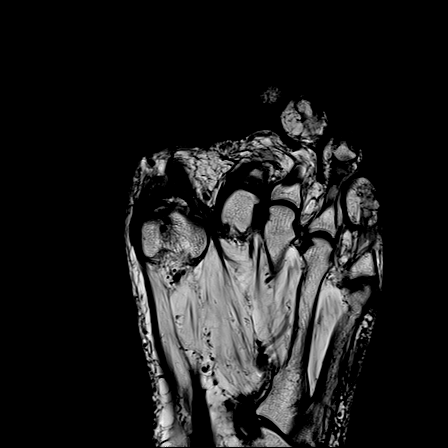
[im 13/20]
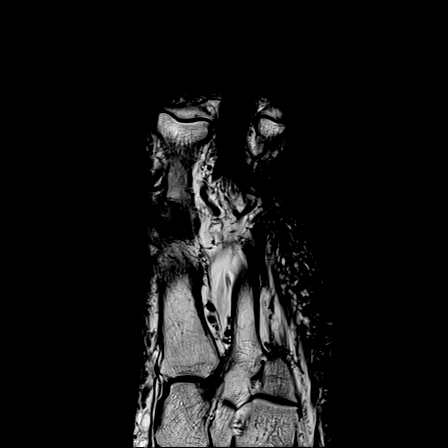
[im 20/20]
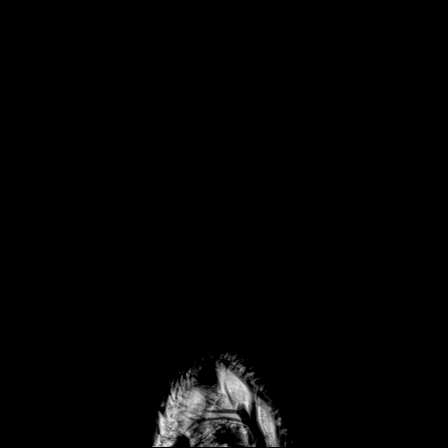

[Series 9: T1 fat-sat · coronal · left · 4.0mm · 0.36mm/px · 6 of 32 slices shown (2 of 3)]
[im 1/32]
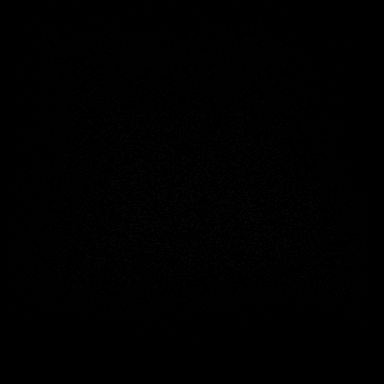
[im 7/32]
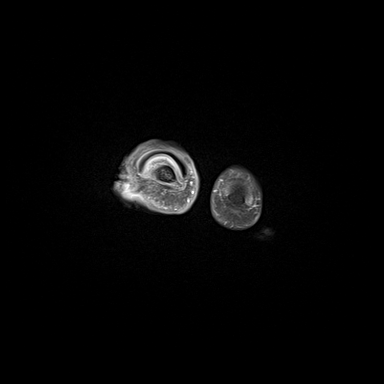
[im 13/32]
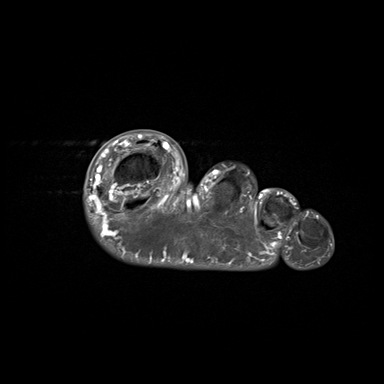
[im 19/32]
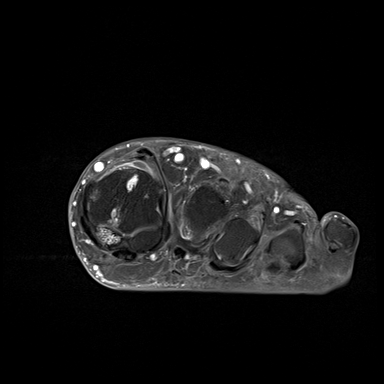
[im 25/32]
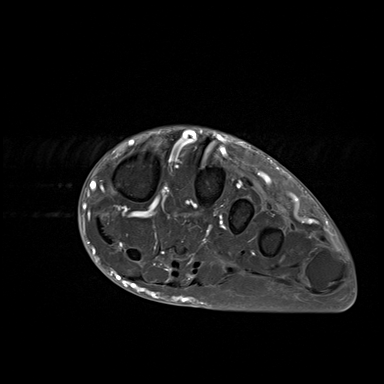
[im 32/32]
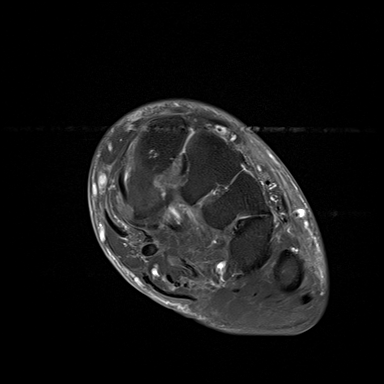

[Series 10: T1 fat-sat · axial · left · 3.0mm · 0.40mm/px · z∈[-54,+12]mm · 4 of 20 slices shown (3 of 3)]
[im 1/20]
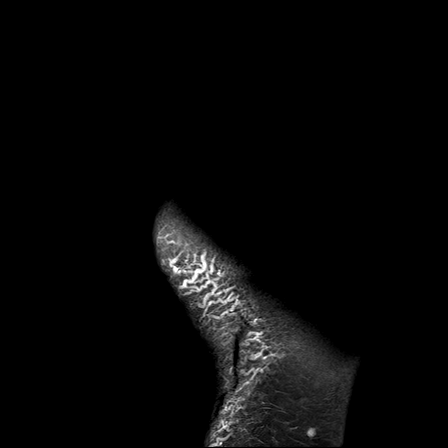
[im 7/20]
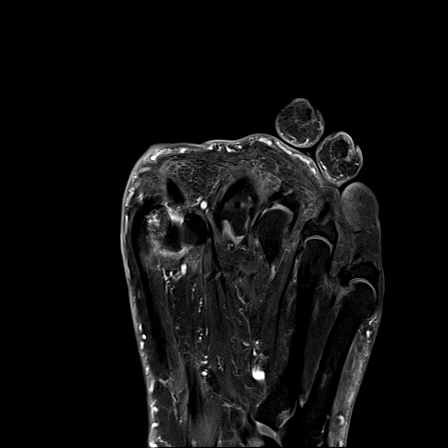
[im 13/20]
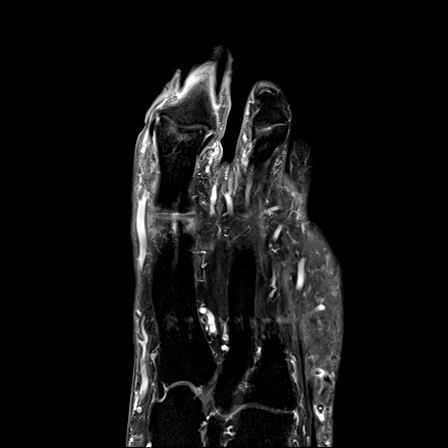
[im 20/20]
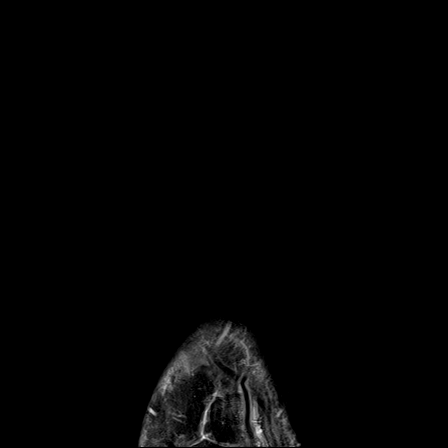

[Series 11: T1 fat-sat post-contrast · sagittal · left · 4.0mm · 0.40mm/px · 4 of 22 slices shown]
[im 1/22]
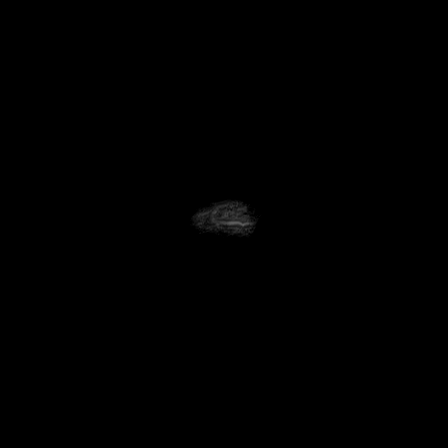
[im 8/22]
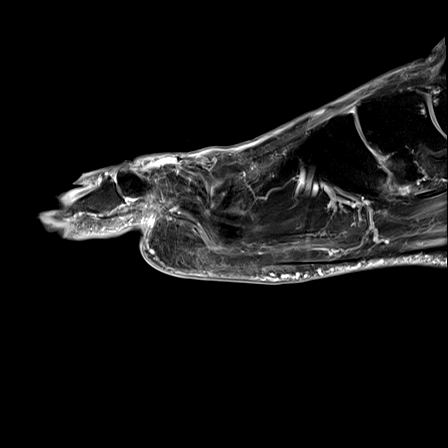
[im 15/22]
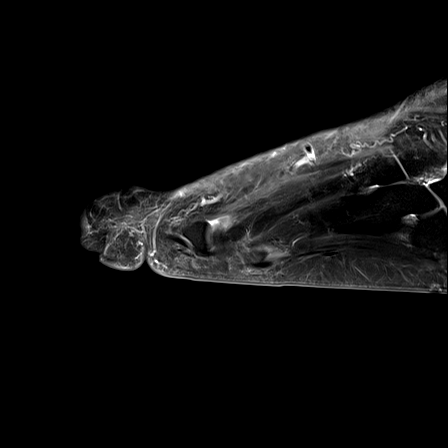
[im 22/22]
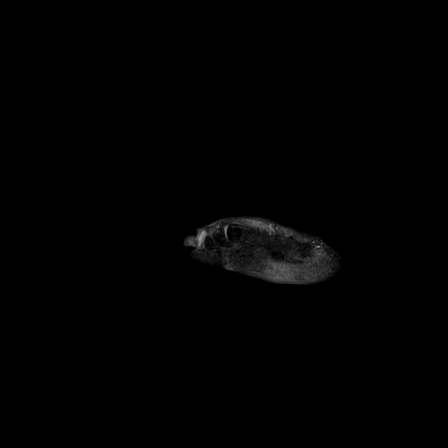

[29 of 40 positions shown; findings below may reference images not displayed]

FINDINGS: Bones/Joint/Cartilage

There is no bone marrow signal abnormality or enhancement to suggest
osteomyelitis. Moderate first MTP osteoarthritis is present with
associated marked marrow edema in the medial sesamoid bone. No
fracture, stress change or focal lesion.

Ligaments

Intact.

Muscles and Tendons

Marked fatty atrophy of the intrinsic musculature the foot is
identified. No intramuscular fluid collection or edema.

Soft tissues

Skin wound is seen along the medial margin of the great toe at the
level of the distal phalanx. No underlying abscess.
IMPRESSION: Large appearing skin wound on the medial aspect of the great toe
without underlying abscess or osteomyelitis.

First MTP osteoarthritis. Marked marrow edema in the medial sesamoid
bone is likely related to osteoarthritis but could be due to
sesamoiditis.

## 2019-08-13 DIAGNOSIS — M25562 Pain in left knee: Secondary | ICD-10-CM | POA: Diagnosis not present

## 2019-08-16 DIAGNOSIS — M25562 Pain in left knee: Secondary | ICD-10-CM | POA: Diagnosis not present

## 2019-08-21 DIAGNOSIS — M25562 Pain in left knee: Secondary | ICD-10-CM | POA: Diagnosis not present

## 2019-08-27 DIAGNOSIS — M25562 Pain in left knee: Secondary | ICD-10-CM | POA: Diagnosis not present

## 2019-08-30 DIAGNOSIS — M25562 Pain in left knee: Secondary | ICD-10-CM | POA: Diagnosis not present

## 2019-09-03 DIAGNOSIS — M25562 Pain in left knee: Secondary | ICD-10-CM | POA: Diagnosis not present

## 2019-09-07 DIAGNOSIS — M25562 Pain in left knee: Secondary | ICD-10-CM | POA: Diagnosis not present

## 2019-09-18 DIAGNOSIS — M25562 Pain in left knee: Secondary | ICD-10-CM | POA: Diagnosis not present

## 2019-10-24 DIAGNOSIS — E1159 Type 2 diabetes mellitus with other circulatory complications: Secondary | ICD-10-CM | POA: Diagnosis not present

## 2019-10-30 DIAGNOSIS — R918 Other nonspecific abnormal finding of lung field: Secondary | ICD-10-CM | POA: Diagnosis not present

## 2019-10-30 DIAGNOSIS — E785 Hyperlipidemia, unspecified: Secondary | ICD-10-CM | POA: Diagnosis not present

## 2019-10-30 DIAGNOSIS — J449 Chronic obstructive pulmonary disease, unspecified: Secondary | ICD-10-CM | POA: Diagnosis not present

## 2019-10-30 DIAGNOSIS — I10 Essential (primary) hypertension: Secondary | ICD-10-CM | POA: Diagnosis not present

## 2019-10-30 DIAGNOSIS — E1159 Type 2 diabetes mellitus with other circulatory complications: Secondary | ICD-10-CM | POA: Diagnosis not present

## 2020-01-09 DIAGNOSIS — L97522 Non-pressure chronic ulcer of other part of left foot with fat layer exposed: Secondary | ICD-10-CM | POA: Diagnosis not present

## 2020-01-09 DIAGNOSIS — L97512 Non-pressure chronic ulcer of other part of right foot with fat layer exposed: Secondary | ICD-10-CM | POA: Diagnosis not present

## 2020-01-09 DIAGNOSIS — L97909 Non-pressure chronic ulcer of unspecified part of unspecified lower leg with unspecified severity: Secondary | ICD-10-CM | POA: Diagnosis not present

## 2020-01-09 DIAGNOSIS — Z89411 Acquired absence of right great toe: Secondary | ICD-10-CM | POA: Diagnosis not present

## 2020-01-11 DIAGNOSIS — Z20822 Contact with and (suspected) exposure to covid-19: Secondary | ICD-10-CM | POA: Diagnosis not present

## 2020-01-11 DIAGNOSIS — Z20828 Contact with and (suspected) exposure to other viral communicable diseases: Secondary | ICD-10-CM | POA: Diagnosis not present

## 2020-01-23 DIAGNOSIS — L97512 Non-pressure chronic ulcer of other part of right foot with fat layer exposed: Secondary | ICD-10-CM | POA: Diagnosis not present

## 2020-01-23 DIAGNOSIS — B079 Viral wart, unspecified: Secondary | ICD-10-CM | POA: Diagnosis not present

## 2020-01-23 DIAGNOSIS — M19071 Primary osteoarthritis, right ankle and foot: Secondary | ICD-10-CM | POA: Diagnosis not present

## 2020-01-23 DIAGNOSIS — L97526 Non-pressure chronic ulcer of other part of left foot with bone involvement without evidence of necrosis: Secondary | ICD-10-CM | POA: Diagnosis not present

## 2020-01-23 DIAGNOSIS — Z89411 Acquired absence of right great toe: Secondary | ICD-10-CM | POA: Diagnosis not present

## 2020-01-23 DIAGNOSIS — L97522 Non-pressure chronic ulcer of other part of left foot with fat layer exposed: Secondary | ICD-10-CM | POA: Diagnosis not present

## 2020-01-23 DIAGNOSIS — F17200 Nicotine dependence, unspecified, uncomplicated: Secondary | ICD-10-CM | POA: Diagnosis not present

## 2020-01-23 DIAGNOSIS — F17210 Nicotine dependence, cigarettes, uncomplicated: Secondary | ICD-10-CM | POA: Diagnosis not present

## 2020-01-23 DIAGNOSIS — Q688 Other specified congenital musculoskeletal deformities: Secondary | ICD-10-CM | POA: Diagnosis not present

## 2020-01-23 DIAGNOSIS — M19072 Primary osteoarthritis, left ankle and foot: Secondary | ICD-10-CM | POA: Diagnosis not present

## 2020-01-23 DIAGNOSIS — S81809D Unspecified open wound, unspecified lower leg, subsequent encounter: Secondary | ICD-10-CM | POA: Diagnosis not present

## 2020-01-23 DIAGNOSIS — M7731 Calcaneal spur, right foot: Secondary | ICD-10-CM | POA: Diagnosis not present

## 2020-01-23 DIAGNOSIS — M85871 Other specified disorders of bone density and structure, right ankle and foot: Secondary | ICD-10-CM | POA: Diagnosis not present

## 2020-01-23 DIAGNOSIS — M85872 Other specified disorders of bone density and structure, left ankle and foot: Secondary | ICD-10-CM | POA: Diagnosis not present

## 2020-01-24 DIAGNOSIS — L89890 Pressure ulcer of other site, unstageable: Secondary | ICD-10-CM | POA: Diagnosis not present

## 2020-01-30 DIAGNOSIS — E11621 Type 2 diabetes mellitus with foot ulcer: Secondary | ICD-10-CM | POA: Diagnosis not present

## 2020-01-30 DIAGNOSIS — L97512 Non-pressure chronic ulcer of other part of right foot with fat layer exposed: Secondary | ICD-10-CM | POA: Diagnosis not present

## 2020-01-30 DIAGNOSIS — L97522 Non-pressure chronic ulcer of other part of left foot with fat layer exposed: Secondary | ICD-10-CM | POA: Diagnosis not present

## 2020-01-30 DIAGNOSIS — F17210 Nicotine dependence, cigarettes, uncomplicated: Secondary | ICD-10-CM | POA: Diagnosis not present

## 2020-01-30 DIAGNOSIS — E1142 Type 2 diabetes mellitus with diabetic polyneuropathy: Secondary | ICD-10-CM | POA: Diagnosis not present

## 2020-02-08 DIAGNOSIS — B079 Viral wart, unspecified: Secondary | ICD-10-CM | POA: Diagnosis not present

## 2020-02-20 DIAGNOSIS — L89890 Pressure ulcer of other site, unstageable: Secondary | ICD-10-CM | POA: Diagnosis not present

## 2020-05-16 DIAGNOSIS — E1159 Type 2 diabetes mellitus with other circulatory complications: Secondary | ICD-10-CM | POA: Diagnosis not present

## 2020-05-16 DIAGNOSIS — E7849 Other hyperlipidemia: Secondary | ICD-10-CM | POA: Diagnosis not present

## 2020-05-16 DIAGNOSIS — Z0000 Encounter for general adult medical examination without abnormal findings: Secondary | ICD-10-CM | POA: Diagnosis not present

## 2020-05-23 DIAGNOSIS — I10 Essential (primary) hypertension: Secondary | ICD-10-CM | POA: Diagnosis not present

## 2020-05-23 DIAGNOSIS — E1169 Type 2 diabetes mellitus with other specified complication: Secondary | ICD-10-CM | POA: Diagnosis not present

## 2020-05-23 DIAGNOSIS — R82998 Other abnormal findings in urine: Secondary | ICD-10-CM | POA: Diagnosis not present

## 2020-05-23 DIAGNOSIS — L0390 Cellulitis, unspecified: Secondary | ICD-10-CM | POA: Diagnosis not present

## 2020-05-23 DIAGNOSIS — Z1331 Encounter for screening for depression: Secondary | ICD-10-CM | POA: Diagnosis not present

## 2020-05-23 DIAGNOSIS — D692 Other nonthrombocytopenic purpura: Secondary | ICD-10-CM | POA: Diagnosis not present

## 2020-05-23 DIAGNOSIS — Z0000 Encounter for general adult medical examination without abnormal findings: Secondary | ICD-10-CM | POA: Diagnosis not present

## 2020-05-23 DIAGNOSIS — J449 Chronic obstructive pulmonary disease, unspecified: Secondary | ICD-10-CM | POA: Diagnosis not present

## 2020-05-23 DIAGNOSIS — L97509 Non-pressure chronic ulcer of other part of unspecified foot with unspecified severity: Secondary | ICD-10-CM | POA: Diagnosis not present

## 2020-05-23 DIAGNOSIS — N401 Enlarged prostate with lower urinary tract symptoms: Secondary | ICD-10-CM | POA: Diagnosis not present

## 2020-05-23 DIAGNOSIS — C649 Malignant neoplasm of unspecified kidney, except renal pelvis: Secondary | ICD-10-CM | POA: Diagnosis not present

## 2020-05-23 DIAGNOSIS — I739 Peripheral vascular disease, unspecified: Secondary | ICD-10-CM | POA: Diagnosis not present

## 2020-05-29 ENCOUNTER — Other Ambulatory Visit: Payer: Self-pay

## 2020-05-29 ENCOUNTER — Ambulatory Visit: Payer: Self-pay

## 2020-05-29 ENCOUNTER — Ambulatory Visit (INDEPENDENT_AMBULATORY_CARE_PROVIDER_SITE_OTHER): Payer: Medicare PPO | Admitting: Orthopedic Surgery

## 2020-05-29 ENCOUNTER — Other Ambulatory Visit (HOSPITAL_COMMUNITY)
Admission: RE | Admit: 2020-05-29 | Discharge: 2020-05-29 | Disposition: A | Discharge status: Home / Self Care | Payer: Medicare PPO | Source: Ambulatory Visit | Attending: Orthopedic Surgery | Admitting: Orthopedic Surgery

## 2020-05-29 ENCOUNTER — Other Ambulatory Visit: Payer: Self-pay | Admitting: Physician Assistant

## 2020-05-29 ENCOUNTER — Encounter (HOSPITAL_COMMUNITY): Payer: Self-pay | Admitting: Orthopedic Surgery

## 2020-05-29 ENCOUNTER — Encounter: Payer: Self-pay | Admitting: Orthopedic Surgery

## 2020-05-29 VITALS — Ht >= 80 in | Wt 387.0 lb

## 2020-05-29 DIAGNOSIS — Z20822 Contact with and (suspected) exposure to covid-19: Secondary | ICD-10-CM | POA: Diagnosis not present

## 2020-05-29 DIAGNOSIS — M79672 Pain in left foot: Secondary | ICD-10-CM | POA: Diagnosis not present

## 2020-05-29 DIAGNOSIS — M86272 Subacute osteomyelitis, left ankle and foot: Secondary | ICD-10-CM

## 2020-05-29 DIAGNOSIS — L02612 Cutaneous abscess of left foot: Secondary | ICD-10-CM | POA: Diagnosis not present

## 2020-05-29 DIAGNOSIS — Z01812 Encounter for preprocedural laboratory examination: Secondary | ICD-10-CM | POA: Diagnosis not present

## 2020-05-29 LAB — SARS CORONAVIRUS 2 (TAT 6-24 HRS): SARS Coronavirus 2: NEGATIVE

## 2020-05-29 MED ORDER — OXYCODONE-ACETAMINOPHEN 5-325 MG PO TABS: 1.0000 | ORAL_TABLET | ORAL | 0 refills | Status: DC | PRN

## 2020-05-29 NOTE — Progress Notes (Signed)
Office Visit Note   Patient: [name].           Date of Birth: December 19, 1955           MRN: [id number] Visit Date: 05/29/2020              Requested by: [name], MD [address] [place],  [place] [address] PCP: [name], MD  Chief Complaint  Patient presents with  . Left Foot - Open Wound, Pain      HPI: Patient is a 64 year old gentleman who is seen for initial evaluation for chronic left foot ulcers.  Patient has been on doxycycline he states he has had swelling redness and drainage and odor from both the great toe and fourth toe.   Assessment & Plan: Visit Diagnoses:  1. Left foot pain   2. Subacute osteomyelitis, left ankle and foot ([place])   3. Cutaneous abscess of left foot     Plan: Patient has chronic osteomyelitis with abscess ulceration and purulent drainage from the great toe and fourth toe.  Discussed that with patient's large size his best option is to proceed with a transmetatarsal amputation.  We will plan for wound VAC postoperatively.  Patient states that he lives alone we will need to keep him overnight for observation.  Discussed the importance of nonweightbearing after surgery.  Patient requests a prescription for Percocet to be called in at this time so he has pain medicine before and after surgery.  Follow-Up Instructions: Return in about 2 weeks (around 06/12/2020).   Ortho Exam  Patient is alert, oriented, no adenopathy, well-dressed, normal affect, normal respiratory effort. Examination patient has a strong dorsalis pedis pulse he has body skin color changes with venous edema in both lower extremities but no venous ulcers.  He has sausage digit swelling of the great toe and fourth toe left foot.  There is massive ulceration to both toes with exposed bone.  Radiograph shows destructive bony changes of the great toe and fourth toe.    Imaging: XR Foot Complete Left  Result Date: 05/29/2020 2 view radiographs of the left foot shows  chronic destructive changes of the left great toe and the left fourth toe secondary to chronic osteomyelitis  No images are attached to the encounter.  Labs: Lab Results  Component Value Date   HGBA1C 6.0 (H) 08/23/2017   HGBA1C 5.3 03/12/2014   HGBA1C 5.4 07/04/2012   ESRSEDRATE 14 06/14/2017   ESRSEDRATE 30 05/17/2017   ESRSEDRATE 23 (H) 01/31/2015   CRP 23.4 (H) 06/14/2017   CRP 37.4 (H) 05/17/2017   CRP 55.8 (H) 03/29/2017   REPTSTATUS 09/17/2010 FINAL 08/03/2010   GRAMSTAIN  08/02/2010    FEW WBC PRESENT, PREDOMINANTLY PMN NO SQUAMOUS EPITHELIAL CELLS SEEN RARE GRAM NEGATIVE RODS   CULT NO ACID FAST BACILLI ISOLATED IN 6 WEEKS 08/03/2010     Lab Results  Component Value Date   ALBUMIN 4.0 03/12/2014   ALBUMIN 3.0 (L) 07/04/2012   ALBUMIN 1.7 (L) 08/02/2010   PREALBUMIN 4.3 (L) 08/02/2010    Lab Results  Component Value Date   MG 2.4 07/04/2012   MG 2.2 08/01/2010   No results found for: VD25OH  Lab Results  Component Value Date   PREALBUMIN 4.3 (L) 08/02/2010   CBC EXTENDED Latest Ref Rng & Units 01/02/2018 01/01/2018 12/31/2017  WBC 4.0 - 10.5 K/uL 12.4(H) 15.1(H) 12.9(H)  RBC 4.22 - 5.81 MIL/uL 4.16(L) 4.09(L) 4.42  HGB 13.0 - 17.0 g/dL 12.5(L) 12.4(L)  13.4  HCT 39 - 52 % 38.4(L) 37.6(L) 41.0  PLT 150 - 400 K/uL 223 232 272  NEUTROABS 1 - 7 x10E3/uL - - -  LYMPHSABS 0 - 3 x10E3/uL - - -     Body mass index is 42.51 kg/m.  Orders:  Orders Placed This Encounter  Procedures  . XR Foot Complete Left   Meds ordered this encounter  Medications  . oxyCODONE-acetaminophen (PERCOCET/ROXICET) 5-325 MG tablet    Sig: Take 1 tablet by mouth every 4 (four) hours as needed for severe pain.    Dispense:  30 tablet    Refill:  0     Procedures: No procedures performed  Clinical Data: No additional findings.  ROS:  All other systems negative, except as noted in the HPI. Review of Systems  Objective: Vital Signs: Ht 6\' 8"  (2.032 m)   Wt (!) 387 lb  (175.5 kg)   BMI 42.51 kg/m   Specialty Comments:  No specialty comments available.  PMFS History: Patient Active Problem List   Diagnosis Date Noted  . Skin ulcers of both feet ([place]) 10/24/2017  . Colovesical fistula   . Benign neoplasm of cecum   . Chronic venous insufficiency 07/20/2017  . Chronic left-sided low back pain without sciatica 11/08/2016  . Toe ulcer ([place]) 03/08/2016  . Verruca 12/28/2015  . Pre-ulcerative calluses 12/28/2015  . History of adenomatous polyp of colon 06/09/2015  . Lipoma 03/13/2014  . Plantar warts 07/09/2012  . Cellulitis 07/04/2012  . Neuropathy 07/04/2012  . Hyperglycemia 07/04/2012  . HYPERTENSION, BENIGN 10/20/2010  . EDEMA 08/20/2010  . WISDOM TEETH EXTRACTION, HX OF 08/20/2010  . HYPOALBUMINEMIA 08/19/2010  . ANEMIA 08/19/2010  . NICOTINE ADDICTION 08/19/2010  . NECROTIZING PNEUMONIA 08/19/2010  . OSTEOARTHRITIS 08/19/2010   Past Medical History:  Diagnosis Date  . Arthritis   . Chronic kidney disease    mass r kidney - partial nephrectomy  . COPD (chronic obstructive pulmonary disease) ([place])   . Diverticulitis   . History of kidney stones   . Hx of adenomatous colonic polyps 06/09/2015  . Hyperlipidemia   . Hypertension    has previously been on medication but then taken off - 05/29/20 pt states he's never been treated for HTN  . Neuromuscular disorder (HCC)    neuropathy lower legs and feet  . Neuropathy    in both feet  . Pneumonia 2011    Family History  Problem Relation Age of Onset  . Heart disease Mother   . Esophageal cancer Mother   . Diabetes Father   . Pancreatic cancer Father   . Breast cancer Sister   . Colon cancer Neg Hx   . Rectal cancer Neg Hx   . Stomach cancer Neg Hx   . Colon polyps Neg Hx     Past Surgical History:  Procedure Laterality Date  . AMPUTATION Bilateral 06/05/2013   Procedure: RIGHT GREAT TOE AMPUTATION/LEFT GREAT TOE DEBRIDEMENT;  Surgeon: Wylene Simmer, MD;  Location: [facility];  Service:  Orthopedics;  Laterality: Bilateral;  . COLON SURGERY     robotic partial colectomy Dr. Marcello Moores 12-30-17  . COLONOSCOPY     x 2 last date 09/21/2017 bladder attached to colon eval  . COLONOSCOPY WITH PROPOFOL N/A 09/21/2017   Procedure: COLONOSCOPY WITH PROPOFOL;  Surgeon: [name], MD;  Location: [facility];  Service: Endoscopy;  Laterality: N/A;  . MOUTH SURGERY    . PARTIAL NEPHRECTOMY     right Dr. Leighton Ruff 06-03-72  .  ROBOTIC ASSITED PARTIAL NEPHRECTOMY Right 12/30/2017   Procedure: XI ROBOTIC ASSITED RIGHT PARTIAL NEPHRECTOMY;  Surgeon: Alexis Frock, MD;  Location[facility];  Service: Urology;  Laterality: Right;  . TOE AMPUTATION Right 06/05/2013   RIGHT GREAT TOE PARTIAL AMPUTATION    . TOOTH EXTRACTION  2011   multiple teeth extracted   Social History   Occupational History  . Occupation: retired  Tobacco Use  . Smoking status: Current Some Day Smoker    Packs/day: 0.10    Years: 20.00    Pack years: 2.00    Types: Cigarettes    Start date: 11/29/1973  . Smokeless tobacco: Never Used  . Tobacco comment: occasional smoker - ~1 pack/week. smoked ~1.5ppd until 5 yr ago  Vaping Use  . Vaping Use: Never used  Substance and Sexual Activity  . Alcohol use: No    Alcohol/week: 0.0 standard drinks  . Drug use: No  . Sexual activity: Not Currently    Partners: Female

## 2020-05-29 NOTE — Progress Notes (Addendum)
Spoke with pt for pre-op call. Pt denies HTN, Cardiac history and Diabetes. Pt is treated for high cholesterol.   Covid test done today, results pending. Pt understands he's to be in quarantine as of now until he comes to the hospital tomorrow. Pt states he's fully vaccinated, 1st dose of the Pfizer vaccine ws 03/13/20 and 2nd dose was 04/22/20.   Pt was seen by his PCP, Dr. Ardeth Perfect last week and states he had blood work done on 05/26/20. I have called Dr. Hoover Brunette office and requested those results.  ERAS protocol ordered. Pt unable to come and pick up an Ensure drink due to his foot infection. Instructed pt not to eat food after midnight, but he may drink clear liquids until 8:00 AM. Pt voiced understanding.

## 2020-05-30 ENCOUNTER — Encounter (HOSPITAL_COMMUNITY): Payer: Self-pay | Admitting: Orthopedic Surgery

## 2020-05-30 ENCOUNTER — Ambulatory Visit (HOSPITAL_COMMUNITY): Payer: Medicare PPO | Admitting: Anesthesiology

## 2020-05-30 ENCOUNTER — Observation Stay (HOSPITAL_COMMUNITY)
Admission: RE | Admit: 2020-05-30 | Discharge: 2020-05-31 | Disposition: A | Discharge status: Home / Self Care | Payer: Medicare PPO | Attending: Orthopedic Surgery | Admitting: Orthopedic Surgery

## 2020-05-30 ENCOUNTER — Encounter (HOSPITAL_COMMUNITY)
Admission: RE | Disposition: A | Discharge status: Home / Self Care | Payer: Self-pay | Source: Home / Self Care | Attending: Orthopedic Surgery

## 2020-05-30 DIAGNOSIS — L97529 Non-pressure chronic ulcer of other part of left foot with unspecified severity: Secondary | ICD-10-CM | POA: Insufficient documentation

## 2020-05-30 DIAGNOSIS — L02612 Cutaneous abscess of left foot: Secondary | ICD-10-CM | POA: Diagnosis not present

## 2020-05-30 DIAGNOSIS — N189 Chronic kidney disease, unspecified: Secondary | ICD-10-CM | POA: Diagnosis not present

## 2020-05-30 DIAGNOSIS — Z9049 Acquired absence of other specified parts of digestive tract: Secondary | ICD-10-CM | POA: Diagnosis not present

## 2020-05-30 DIAGNOSIS — M86672 Other chronic osteomyelitis, left ankle and foot: Secondary | ICD-10-CM | POA: Diagnosis present

## 2020-05-30 DIAGNOSIS — F17210 Nicotine dependence, cigarettes, uncomplicated: Secondary | ICD-10-CM | POA: Insufficient documentation

## 2020-05-30 DIAGNOSIS — G8918 Other acute postprocedural pain: Secondary | ICD-10-CM | POA: Diagnosis not present

## 2020-05-30 DIAGNOSIS — J449 Chronic obstructive pulmonary disease, unspecified: Secondary | ICD-10-CM | POA: Insufficient documentation

## 2020-05-30 DIAGNOSIS — M86272 Subacute osteomyelitis, left ankle and foot: Secondary | ICD-10-CM | POA: Diagnosis not present

## 2020-05-30 DIAGNOSIS — I10 Essential (primary) hypertension: Secondary | ICD-10-CM | POA: Diagnosis not present

## 2020-05-30 DIAGNOSIS — Z89411 Acquired absence of right great toe: Secondary | ICD-10-CM | POA: Insufficient documentation

## 2020-05-30 DIAGNOSIS — I129 Hypertensive chronic kidney disease with stage 1 through stage 4 chronic kidney disease, or unspecified chronic kidney disease: Secondary | ICD-10-CM | POA: Insufficient documentation

## 2020-05-30 DIAGNOSIS — M86172 Other acute osteomyelitis, left ankle and foot: Secondary | ICD-10-CM | POA: Diagnosis not present

## 2020-05-30 DIAGNOSIS — Z905 Acquired absence of kidney: Secondary | ICD-10-CM | POA: Insufficient documentation

## 2020-05-30 DIAGNOSIS — D649 Anemia, unspecified: Secondary | ICD-10-CM | POA: Diagnosis not present

## 2020-05-30 HISTORY — DX: Diverticulitis of intestine, part unspecified, without perforation or abscess without bleeding: K57.92

## 2020-05-30 HISTORY — PX: AMPUTATION: SHX166

## 2020-05-30 HISTORY — PX: APPLICATION OF WOUND VAC: SHX5189

## 2020-05-30 LAB — CBC
HCT: 43.3 % (ref 39.0–52.0)
Hemoglobin: 13.6 g/dL (ref 13.0–17.0)
MCH: 29.1 pg (ref 26.0–34.0)
MCHC: 31.4 g/dL (ref 30.0–36.0)
MCV: 92.7 fL (ref 80.0–100.0)
Platelets: 445 10*3/uL — ABNORMAL HIGH (ref 150–400)
RBC: 4.67 MIL/uL (ref 4.22–5.81)
RDW: 14.8 % (ref 11.5–15.5)
WBC: 9 10*3/uL (ref 4.0–10.5)
nRBC: 0 % (ref 0.0–0.2)

## 2020-05-30 LAB — BASIC METABOLIC PANEL
Anion gap: 11 (ref 5–15)
BUN: 12 mg/dL (ref 8–23)
CO2: 23 mmol/L (ref 22–32)
Calcium: 8.6 mg/dL — ABNORMAL LOW (ref 8.9–10.3)
Chloride: 102 mmol/L (ref 98–111)
Creatinine, Ser: 1.19 mg/dL (ref 0.61–1.24)
GFR calc Af Amer: >60 mL/min (ref >60)
GFR calc non Af Amer: >60 mL/min (ref >60)
Glucose, Bld: 99 mg/dL (ref 70–99)
Potassium: 4.2 mmol/L (ref 3.5–5.1)
Sodium: 136 mmol/L (ref 135–145)

## 2020-05-30 SURGERY — AMPUTATION, FOOT, RAY
Anesthesia: General | Site: Foot | Laterality: Left

## 2020-05-30 MED ORDER — LIDOCAINE 2% (20 MG/ML) 5 ML SYRINGE
INTRAMUSCULAR | Status: DC | PRN
  Administered 2020-05-30: 40 mg via INTRAVENOUS

## 2020-05-30 MED ORDER — OXYCODONE HCL 5 MG PO TABS
5.0000 mg | ORAL_TABLET | ORAL | Status: DC | PRN
  Administered 2020-05-30 (×2): 10 mg via ORAL
  Filled 2020-05-30 (×2): qty 2

## 2020-05-30 MED ORDER — EPHEDRINE 5 MG/ML INJ
INTRAVENOUS | Status: AC
  Filled 2020-05-30: qty 10

## 2020-05-30 MED ORDER — PROPOFOL 10 MG/ML IV BOLUS
INTRAVENOUS | Status: AC
  Filled 2020-05-30: qty 20

## 2020-05-30 MED ORDER — CEFAZOLIN SODIUM-DEXTROSE 2-4 GM/100ML-% IV SOLN
2.0000 g | Freq: Four times a day (QID) | INTRAVENOUS | Status: AC
  Administered 2020-05-30 (×2): 2 g via INTRAVENOUS
  Filled 2020-05-30 (×2): qty 100

## 2020-05-30 MED ORDER — PROPOFOL 10 MG/ML IV BOLUS
INTRAVENOUS | Status: DC | PRN
  Administered 2020-05-30: 200 mg via INTRAVENOUS

## 2020-05-30 MED ORDER — METHOCARBAMOL 500 MG PO TABS: 500.0000 mg | ORAL_TABLET | Freq: Four times a day (QID) | ORAL | Status: DC | PRN

## 2020-05-30 MED ORDER — DOCUSATE SODIUM 100 MG PO CAPS
100.0000 mg | ORAL_CAPSULE | Freq: Two times a day (BID) | ORAL | Status: DC
  Administered 2020-05-30 – 2020-05-31 (×3): 100 mg via ORAL
  Filled 2020-05-30 (×3): qty 1

## 2020-05-30 MED ORDER — PREGABALIN 75 MG PO CAPS
150.0000 mg | ORAL_CAPSULE | Freq: Three times a day (TID) | ORAL | Status: DC
  Administered 2020-05-30 – 2020-05-31 (×3): 150 mg via ORAL
  Filled 2020-05-30 (×3): qty 2

## 2020-05-30 MED ORDER — METOCLOPRAMIDE HCL 5 MG/ML IJ SOLN: 5.0000 mg | Freq: Three times a day (TID) | INTRAMUSCULAR | Status: DC | PRN

## 2020-05-30 MED ORDER — 0.9 % SODIUM CHLORIDE (POUR BTL) OPTIME
TOPICAL | Status: DC | PRN
  Administered 2020-05-30: 1000 mL

## 2020-05-30 MED ORDER — ALBUTEROL SULFATE (2.5 MG/3ML) 0.083% IN NEBU: 3.0000 mL | INHALATION_SOLUTION | Freq: Four times a day (QID) | RESPIRATORY_TRACT | Status: DC | PRN

## 2020-05-30 MED ORDER — MIDAZOLAM HCL 2 MG/2ML IJ SOLN
INTRAMUSCULAR | Status: AC
  Administered 2020-05-30: 1 mg via INTRAVENOUS
  Filled 2020-05-30: qty 2

## 2020-05-30 MED ORDER — DEXAMETHASONE SODIUM PHOSPHATE 10 MG/ML IJ SOLN
INTRAMUSCULAR | Status: DC | PRN
Start: 2020-05-30 — End: 2020-05-30
  Administered 2020-05-30: 10 mg via INTRAVENOUS

## 2020-05-30 MED ORDER — ONDANSETRON HCL 4 MG PO TABS: 4.0000 mg | ORAL_TABLET | Freq: Four times a day (QID) | ORAL | Status: DC | PRN

## 2020-05-30 MED ORDER — MIDAZOLAM HCL 2 MG/2ML IJ SOLN: 1.0000 mg | Freq: Once | INTRAMUSCULAR | Status: AC

## 2020-05-30 MED ORDER — SODIUM CHLORIDE 0.9 % IV SOLN: INTRAVENOUS | Status: DC

## 2020-05-30 MED ORDER — METOCLOPRAMIDE HCL 5 MG PO TABS: 5.0000 mg | ORAL_TABLET | Freq: Three times a day (TID) | ORAL | Status: DC | PRN

## 2020-05-30 MED ORDER — LACTATED RINGERS IV SOLN: INTRAVENOUS | Status: DC

## 2020-05-30 MED ORDER — FENTANYL CITRATE (PF) 100 MCG/2ML IJ SOLN: 50.0000 ug | Freq: Once | INTRAMUSCULAR | Status: AC

## 2020-05-30 MED ORDER — METHOCARBAMOL 1000 MG/10ML IJ SOLN
500.0000 mg | Freq: Four times a day (QID) | INTRAVENOUS | Status: DC | PRN
  Filled 2020-05-30: qty 5

## 2020-05-30 MED ORDER — MOMETASONE FURO-FORMOTEROL FUM 200-5 MCG/ACT IN AERO
2.0000 | INHALATION_SPRAY | Freq: Two times a day (BID) | RESPIRATORY_TRACT | Status: DC
  Administered 2020-05-31: 2 via RESPIRATORY_TRACT
  Filled 2020-05-30: qty 8.8

## 2020-05-30 MED ORDER — ORAL CARE MOUTH RINSE: 15.0000 mL | Freq: Once | OROMUCOSAL | Status: AC

## 2020-05-30 MED ORDER — MIDAZOLAM HCL 2 MG/2ML IJ SOLN
INTRAMUSCULAR | Status: AC
  Filled 2020-05-30: qty 2

## 2020-05-30 MED ORDER — ATORVASTATIN CALCIUM 10 MG PO TABS
10.0000 mg | ORAL_TABLET | Freq: Every day | ORAL | Status: DC
  Administered 2020-05-31: 10 mg via ORAL
  Filled 2020-05-30: qty 1

## 2020-05-30 MED ORDER — CHLORHEXIDINE GLUCONATE 0.12 % MT SOLN
15.0000 mL | Freq: Once | OROMUCOSAL | Status: AC
  Administered 2020-05-30: 15 mL via OROMUCOSAL
  Filled 2020-05-30: qty 15

## 2020-05-30 MED ORDER — ONDANSETRON HCL 4 MG/2ML IJ SOLN
INTRAMUSCULAR | Status: AC
  Filled 2020-05-30: qty 2

## 2020-05-30 MED ORDER — EPHEDRINE SULFATE-NACL 50-0.9 MG/10ML-% IV SOSY
PREFILLED_SYRINGE | INTRAVENOUS | Status: DC | PRN
  Administered 2020-05-30 (×2): 20 mg via INTRAVENOUS
  Administered 2020-05-30: 10 mg via INTRAVENOUS

## 2020-05-30 MED ORDER — FENTANYL CITRATE (PF) 100 MCG/2ML IJ SOLN
INTRAMUSCULAR | Status: AC
  Administered 2020-05-30: 50 ug via INTRAVENOUS
  Filled 2020-05-30: qty 2

## 2020-05-30 MED ORDER — DULOXETINE HCL 60 MG PO CPEP
60.0000 mg | ORAL_CAPSULE | Freq: Every day | ORAL | Status: DC
  Administered 2020-05-31: 60 mg via ORAL
  Filled 2020-05-30: qty 1

## 2020-05-30 MED ORDER — MIDAZOLAM HCL 5 MG/5ML IJ SOLN
INTRAMUSCULAR | Status: DC | PRN
  Administered 2020-05-30: 2 mg via INTRAVENOUS

## 2020-05-30 MED ORDER — ONDANSETRON HCL 4 MG/2ML IJ SOLN: 4.0000 mg | Freq: Four times a day (QID) | INTRAMUSCULAR | Status: DC | PRN

## 2020-05-30 MED ORDER — DEXTROSE 5 % IV SOLN
3.0000 g | INTRAVENOUS | Status: AC
  Administered 2020-05-30: 3 g via INTRAVENOUS
  Filled 2020-05-30: qty 3

## 2020-05-30 MED ORDER — ONDANSETRON HCL 4 MG/2ML IJ SOLN
INTRAMUSCULAR | Status: DC | PRN
  Administered 2020-05-30: 4 mg via INTRAVENOUS

## 2020-05-30 MED ORDER — DEXAMETHASONE SODIUM PHOSPHATE 10 MG/ML IJ SOLN
INTRAMUSCULAR | Status: AC
  Filled 2020-05-30: qty 2

## 2020-05-30 MED ORDER — HYDROMORPHONE HCL 1 MG/ML IJ SOLN: 0.5000 mg | INTRAMUSCULAR | Status: DC | PRN

## 2020-05-30 MED ORDER — BUPIVACAINE-EPINEPHRINE (PF) 0.5% -1:200000 IJ SOLN
INTRAMUSCULAR | Status: DC | PRN
Start: 2020-05-30 — End: 2020-05-30
  Administered 2020-05-30: 30 mL via PERINEURAL

## 2020-05-30 MED ORDER — ACETAMINOPHEN 325 MG PO TABS: 325.0000 mg | ORAL_TABLET | Freq: Four times a day (QID) | ORAL | Status: DC | PRN

## 2020-05-30 MED ORDER — FENTANYL CITRATE (PF) 250 MCG/5ML IJ SOLN
INTRAMUSCULAR | Status: AC
  Filled 2020-05-30: qty 5

## 2020-05-30 MED ORDER — TAMSULOSIN HCL 0.4 MG PO CAPS
0.4000 mg | ORAL_CAPSULE | Freq: Two times a day (BID) | ORAL | Status: DC
  Administered 2020-05-30 – 2020-05-31 (×3): 0.4 mg via ORAL
  Filled 2020-05-30 (×3): qty 1

## 2020-05-30 SURGICAL SUPPLY — 29 items
BLADE SAW SGTL MED 73X18.5 STR (BLADE) IMPLANT
BLADE SURG 21 STRL SS (BLADE) ×2 IMPLANT
BNDG COHESIVE 4X5 TAN STRL (GAUZE/BANDAGES/DRESSINGS) ×2 IMPLANT
BNDG GAUZE ELAST 4 BULKY (GAUZE/BANDAGES/DRESSINGS) ×2 IMPLANT
COVER SURGICAL LIGHT HANDLE (MISCELLANEOUS) ×4 IMPLANT
COVER WAND RF STERILE (DRAPES) ×2 IMPLANT
DRAPE INCISE IOBAN 66X45 STRL (DRAPES) ×2 IMPLANT
DRAPE U-SHAPE 47X51 STRL (DRAPES) ×4 IMPLANT
DRSG ADAPTIC 3X8 NADH LF (GAUZE/BANDAGES/DRESSINGS) ×2 IMPLANT
DRSG PAD ABDOMINAL 8X10 ST (GAUZE/BANDAGES/DRESSINGS) ×4 IMPLANT
DURAPREP 26ML APPLICATOR (WOUND CARE) ×2 IMPLANT
ELECT REM PT RETURN 9FT ADLT (ELECTROSURGICAL) ×2
ELECTRODE REM PT RTRN 9FT ADLT (ELECTROSURGICAL) ×1 IMPLANT
GAUZE SPONGE 4X4 12PLY STRL (GAUZE/BANDAGES/DRESSINGS) ×2 IMPLANT
GLOVE BIOGEL PI IND STRL 9 (GLOVE) ×1 IMPLANT
GLOVE BIOGEL PI INDICATOR 9 (GLOVE) ×1
GLOVE SURG ORTHO 9.0 STRL STRW (GLOVE) ×2 IMPLANT
GOWN STRL REUS W/ TWL XL LVL3 (GOWN DISPOSABLE) ×2 IMPLANT
GOWN STRL REUS W/TWL XL LVL3 (GOWN DISPOSABLE) ×4
KIT BASIN OR (CUSTOM PROCEDURE TRAY) ×2 IMPLANT
KIT TURNOVER KIT B (KITS) ×2 IMPLANT
NS IRRIG 1000ML POUR BTL (IV SOLUTION) ×2 IMPLANT
PACK ORTHO EXTREMITY (CUSTOM PROCEDURE TRAY) ×2 IMPLANT
PAD ARMBOARD 7.5X6 YLW CONV (MISCELLANEOUS) ×4 IMPLANT
STOCKINETTE IMPERVIOUS LG (DRAPES) IMPLANT
SUT ETHILON 2 0 PSLX (SUTURE) ×4 IMPLANT
TOWEL GREEN STERILE (TOWEL DISPOSABLE) ×2 IMPLANT
TUBE CONNECTING 12X1/4 (SUCTIONS) ×2 IMPLANT
YANKAUER SUCT BULB TIP NO VENT (SUCTIONS) ×4 IMPLANT

## 2020-05-30 NOTE — Anesthesia Preprocedure Evaluation (Addendum)
Anesthesia Evaluation  Patient identified by MRN, date of birth, ID band Patient awake    Reviewed: Allergy & Precautions, NPO status , Patient's Chart, lab work & pertinent test results  Airway Mallampati: I  TM Distance: >3 FB Neck ROM: Full    Dental  (+) Edentulous Upper, Edentulous Lower   Pulmonary COPD,  COPD inhaler, Current Smoker,    breath sounds clear to auscultation       Cardiovascular hypertension,  Rhythm:Regular Rate:Normal     Neuro/Psych  Neuromuscular disease negative psych ROS   GI/Hepatic negative GI ROS, Neg liver ROS,   Endo/Other    Renal/GU negative Renal ROS     Musculoskeletal  (+) Arthritis ,   Abdominal Normal abdominal exam  (+)   Peds  Hematology   Anesthesia Other Findings   Reproductive/Obstetrics                            Anesthesia Physical Anesthesia Plan  ASA: III  Anesthesia Plan: General   Post-op Pain Management: GA combined w/ Regional for post-op pain   Induction: Intravenous  PONV Risk Score and Plan: 2 and Ondansetron and Dexamethasone  Airway Management Planned: LMA  Additional Equipment: None  Intra-op Plan:   Post-operative Plan: Extubation in OR  Informed Consent: I have reviewed the patients History and Physical, chart, labs and discussed the procedure including the risks, benefits and alternatives for the proposed anesthesia with the patient or authorized representative who has indicated his/her understanding and acceptance.       Plan Discussed with: CRNA  Anesthesia Plan Comments:        Anesthesia Quick Evaluation

## 2020-05-30 NOTE — Progress Notes (Signed)
Orthopedic Tech Progress Note Patient Details:  [name] 04/10/1956 [id number] Spoke to PA who said patient can use his shoe if he prefers. Patient stated he would like to keep his own shoe because he believes it is better quality.  Ortho Devices Type of Ortho Device: Postop shoe/boot Ortho Device/Splint Location: LLE Ortho Device/Splint Interventions: Ordered   Post Interventions Patient Tolerated: Refused intervention Instructions Provided: Other (comment)   [name] D Arliss Frisina 05/30/2020, 3:36 PM

## 2020-05-30 NOTE — Op Note (Signed)
05/30/2020  12:31 PM  PATIENT:  [name].    PRE-OPERATIVE DIAGNOSIS:  Osteomyelitis Left Foot  POST-OPERATIVE DIAGNOSIS:  Same  PROCEDURE:  LEFT TRANSMETATARSAL AMPUTATION, APPLICATION OF WOUND VAC  SURGEON:  Newt Minion, MD  PHYSICIAN ASSISTANT:None ANESTHESIA:   General  PREOPERATIVE INDICATIONS:  Cory Raygoza. is a  64 y.o. male with a diagnosis of Osteomyelitis Left Foot who failed conservative measures and elected for surgical management.    The risks benefits and alternatives were discussed with the patient preoperatively including but not limited to the risks of infection, bleeding, nerve injury, cardiopulmonary complications, the need for revision surgery, among others, and the patient was willing to proceed.  OPERATIVE IMPLANTS: 13 cm Prevena wound VAC  @ENCIMAGES @  OPERATIVE FINDINGS: Good petechial bleeding with calcified vessels  OPERATIVE PROCEDURE: Patient was brought the operating room after undergoing a popliteal block he then underwent a general anesthetic.  After adequate levels anesthesia were obtained patient's left lower extremity was prepped using DuraPrep draped into a sterile field a timeout was called.  A fishmouth incision was made just proximal to the necrotic skin.  This allowed for a distal transmetatarsal amputation.  Electrocautery was used hemostasis the wound was irrigated with normal saline the tissue was healthy and viable.  The incision was closed using 2-0 nylon a Prevena wound VAC was applied this was covered with a compression wrap from the tibial tubercle to the dressing due to the patient's venous insufficiency.  Patient was extubated taken the PACU in stable condition.   DISCHARGE PLANNING:  Antibiotic duration: 24 hours  Weightbearing: Touchdown weightbearing on the left  Pain medication: Opioid pathway  Dressing care/ Wound VAC: Continue wound VAC at discharge  Ambulatory devices: Walker  Discharge to: Plan for  overnight observation anticipate discharge to home.  Follow-up: In the office 1 week post operative.

## 2020-05-30 NOTE — Anesthesia Postprocedure Evaluation (Signed)
Anesthesia Post Note  Patient: [name].  Procedure(s) Performed: LEFT TRANSMETATARSAL AMPUTATION (Left Foot) APPLICATION OF WOUND VAC (Left Foot)     Patient location during evaluation: PACU Anesthesia Type: General Level of consciousness: awake and alert Pain management: pain level controlled Vital Signs Assessment: post-procedure vital signs reviewed and stable Respiratory status: spontaneous breathing, nonlabored ventilation, respiratory function stable and patient connected to nasal cannula oxygen Cardiovascular status: blood pressure returned to baseline and stable Postop Assessment: no apparent nausea or vomiting Anesthetic complications: no   No complications documented.  Last Vitals:  Vitals:   05/30/20 1338 05/30/20 1355  BP: 138/74 (!) 141/75  Pulse: 65 67  Resp: 19 18  Temp: 36.9 C (!) 36.4 C  SpO2: 94% 95%    Last Pain:  Vitals:   05/30/20 1355  TempSrc: Oral  PainSc: 0-No pain                 Effie Berkshire

## 2020-05-30 NOTE — Anesthesia Procedure Notes (Signed)
Procedure Name: LMA Insertion Date/Time: 05/30/2020 11:52 AM Performed by: Lance Coon, CRNA Pre-anesthesia Checklist: Patient identified, Emergency Drugs available, Suction available, Patient being monitored and Timeout performed Patient Re-evaluated:Patient Re-evaluated prior to induction Oxygen Delivery Method: Circle system utilized Preoxygenation: Pre-oxygenation with 100% oxygen Induction Type: IV induction LMA: LMA inserted LMA Size: 5.0 Number of attempts: 1 Placement Confirmation: breath sounds checked- equal and bilateral and positive ETCO2 Tube secured with: Tape Dental Injury: Teeth and Oropharynx as per pre-operative assessment

## 2020-05-30 NOTE — Evaluation (Signed)
Physical Therapy Evaluation Patient Details Name: [name]. MRN: [id number] DOB: Apr 28, 1956 Today's Date: 05/30/2020   History of Present Illness  Pt is a 64 y/o male s/p L TMA. PMH including but not limited to COPD, HTN and neuropathy.  Clinical Impression  Pt presented supine in bed with HOB elevated, awake and willing to participate in therapy session. Prior to admission, pt reported that he ambulated with use of a cane and was independent with ADLs. Pt lives alone in a single level home with two steps to enter. Pt stated that he could have assistance from family/friends 24/7 if needed upon d/c. At the time of evaluation, pt overall at a supervision to min guard level with bed mobility and transfers. However, despite max cueing and demonstration, pt unable to maintain TDWB'ing L LE with standing and pivot transfers. Therefore, would recommend pt d/c home with w/c for mobility until MD allows him to WB through L LE. Will continue to f/u with pt acutely to progress mobility as tolerated. Pt would continue to benefit from skilled physical therapy services at this time while admitted and after d/c to address the below listed limitations in order to improve overall safety and independence with functional mobility.     Follow Up Recommendations Home health PT;Supervision/Assistance - 24 hour    Equipment Recommendations  Rolling walker with 5" wheels;Wheelchair (measurements PT);Wheelchair cushion (measurements PT) (w/c with elevating leg rests)    Recommendations for Other Services       Precautions / Restrictions Precautions Precautions: Fall Precaution Comments: wound VAC Restrictions Weight Bearing Restrictions: Yes LLE Weight Bearing: Touchdown weight bearing      Mobility  Bed Mobility Overal bed mobility: Needs Assistance Bed Mobility: Supine to Sit     Supine to sit: Supervision     General bed mobility comments: for safety and line management  Transfers Overall  transfer level: Needs assistance Equipment used: Rolling walker (2 wheeled) Transfers: Sit to/from [facility] Sit to Stand: Min guard Stand pivot transfers: Min guard       General transfer comment: cueing for safe hand placement, sequencing and technique; pt unable to maintain TDWB'ing L LE throughout, unable to hop on R LE  Ambulation/Gait             General Gait Details: deferred as pt unable to maintain TDWB'ing L LE with standing  Stairs            Wheelchair Mobility    Modified Rankin (Stroke Patients Only)       Balance Overall balance assessment: Needs assistance Sitting-balance support: No upper extremity supported Sitting balance-Leahy Scale: Good     Standing balance support: Bilateral upper extremity supported;Single extremity supported Standing balance-Leahy Scale: Poor                               Pertinent Vitals/Pain Pain Assessment: No/denies pain    Home Living Family/patient expects to be discharged to:: Private residence Living Arrangements: Alone Available Help at Discharge: Family;Available 24 hours/day;Friend(s) Type of Home: House Home Access: Stairs to enter   [employer] of Steps: 2 Home Layout: One level Home Equipment: Garment/textile technologist - single point      Prior Function Level of Independence: Independent with assistive device(s)         Comments: ambulates with use of a cane     Hand Dominance        Extremity/Trunk Assessment  Upper Extremity Assessment Upper Extremity Assessment: Overall WFL for tasks assessed    Lower Extremity Assessment Lower Extremity Assessment: LLE deficits/detail LLE Deficits / Details: wound VAC in place to distal aspect of foot. No active DF/PF. Pt with difficulty maintaining TDWB'ing with standing and transfers       Communication   Communication: No difficulties  Cognition Arousal/Alertness: Awake/alert Behavior During  Therapy: WFL for tasks assessed/performed Overall Cognitive Status: Within Functional Limits for tasks assessed                                        General Comments      Exercises     Assessment/Plan    PT Assessment Patient needs continued PT services  PT Problem List Decreased strength;Decreased range of motion;Decreased balance;Decreased activity tolerance;Decreased mobility;Decreased coordination;Decreased knowledge of use of DME;Decreased safety awareness;Decreased knowledge of precautions       PT Treatment Interventions DME instruction;Gait training;Stair training;Functional mobility training;Therapeutic activities;Therapeutic exercise;Balance training;Neuromuscular re-education;Patient/family education    PT Goals (Current goals can be found in the Care Plan section)  Acute Rehab PT Goals Patient Stated Goal: "home tomorrow" PT Goal Formulation: With patient Time For Goal Achievement: 06/13/20 Potential to Achieve Goals: Good    Frequency Min 5X/week   Barriers to discharge        Co-evaluation               AM-PAC PT "6 Clicks" Mobility  Outcome Measure Help needed turning from your back to your side while in a flat bed without using bedrails?: None Help needed moving from lying on your back to sitting on the side of a flat bed without using bedrails?: None Help needed moving to and from a bed to a chair (including a wheelchair)?: A Little Help needed standing up from a chair using your arms (e.g., wheelchair or bedside chair)?: A Little Help needed to walk in hospital room?: A Lot Help needed climbing 3-5 steps with a railing? : A Lot 6 Click Score: 18    End of Session Equipment Utilized During Treatment: Gait belt Activity Tolerance: Patient tolerated treatment well Patient left: in chair;with call bell/phone within reach;with chair alarm set Nurse Communication: Mobility status PT Visit Diagnosis: Other abnormalities of gait and  mobility (R26.89)    Time: 5003-7048 PT Time Calculation (min) (ACUTE ONLY): 29 min   Charges:   PT Evaluation $PT Eval Moderate Complexity: 1 Mod PT Treatments $Therapeutic Activity: 8-22 mins        Anastasio Champion, DPT  Acute Rehabilitation Services Pager [phone] Office [place] 05/30/2020, 5:00 PM

## 2020-05-30 NOTE — H&P (Signed)
Gilford Lardizabal. is an 64 y.o. male.   Chief Complaint: Left Foot Osteomyelitis HPI: Patient is a 64 year old gentleman who is seen for initial evaluation for chronic left foot ulcers.  Patient has been on doxycycline he states he has had swelling redness and drainage and odor from both the great toe and fourth toe.  Past Medical History:  Diagnosis Date  . Arthritis   . Chronic kidney disease    mass r kidney - partial nephrectomy  . COPD (chronic obstructive pulmonary disease) ([place])   . Diverticulitis   . History of kidney stones   . Hx of adenomatous colonic polyps 06/09/2015  . Hyperlipidemia   . Hypertension    has previously been on medication but then taken off - 05/29/20 pt states he's never been treated for HTN  . Neuromuscular disorder ([facility])    neuropathy lower legs and feet  . Neuropathy    in both feet  . Pneumonia 2011    Past Surgical History:  Procedure Laterality Date  . AMPUTATION Bilateral 06/05/2013   Procedure: RIGHT GREAT TOE AMPUTATION/LEFT GREAT TOE DEBRIDEMENT;  Surgeon: Wylene Simmer, MD;  Location: [facility];  Service: Orthopedics;  Laterality: Bilateral;  . COLON SURGERY     robotic partial colectomy Dr. Marcello Moores 12-30-17  . COLONOSCOPY     x 2 last date 09/21/2017 bladder attached to colon eval  . COLONOSCOPY WITH PROPOFOL N/A 09/21/2017   Procedure: COLONOSCOPY WITH PROPOFOL;  Surgeon: [name], MD;  Location: [facility];  Service: Endoscopy;  Laterality: N/A;  . MOUTH SURGERY    . PARTIAL NEPHRECTOMY     right Dr. Leighton Ruff 2-0-94  . ROBOTIC ASSITED PARTIAL NEPHRECTOMY Right 12/30/2017   Procedure: XI ROBOTIC ASSITED RIGHT PARTIAL NEPHRECTOMY;  Surgeon: Alexis Frock, MD;  Location: [facility];  Service: Urology;  Laterality: Right;  . TOE AMPUTATION Right 06/05/2013   RIGHT GREAT TOE PARTIAL AMPUTATION    . TOOTH EXTRACTION  2011   multiple teeth extracted    Family History  Problem Relation Age of Onset  . Heart disease Mother   . Esophageal  cancer Mother   . Diabetes Father   . Pancreatic cancer Father   . Breast cancer Sister   . Colon cancer Neg Hx   . Rectal cancer Neg Hx   . Stomach cancer Neg Hx   . Colon polyps Neg Hx    Social History:  reports that he has been smoking cigarettes. He started smoking about 46 years ago. He has a 2.00 pack-year smoking history. He has never used smokeless tobacco. He reports that he does not drink alcohol and does not use drugs.  Allergies: No Known Allergies  No medications prior to admission.    Results for orders placed or performed during the hospital encounter of 05/29/20 (from the past 48 hour(s))  SARS CORONAVIRUS 2 (TAT 6-24 HRS) Nasopharyngeal Nasopharyngeal Swab     Status: None   Collection Time: 05/29/20 11:58 AM   Specimen: Nasopharyngeal Swab  Result Value Ref Range   SARS Coronavirus 2 NEGATIVE NEGATIVE    Comment: (NOTE) SARS-CoV-2 target nucleic acids are NOT DETECTED.  The SARS-CoV-2 RNA is generally detectable in upper and lower respiratory specimens during the acute phase of infection. Negative results do not preclude SARS-CoV-2 infection, do not rule out co-infections with other pathogens, and should not be used as the sole basis for treatment or other patient management decisions. Negative results must be combined with clinical observations, patient history,  and epidemiological information. The expected result is Negative.  Fact Sheet for Patients: SugarRoll.be  Fact Sheet for Healthcare Providers: https://www.woods-mathews.com/  This test is not yet approved or cleared by the [place] FDA and  has been authorized for detection and/or diagnosis of SARS-CoV-2 by FDA under an Emergency Use Authorization (EUA). This EUA will remain  in effect (meaning this test can be used) for the duration of the COVID-19 declaration under Se ction 564(b)(1) of the Act, 21 U.S.C. section 360bbb-3(b)(1), unless the  authorization is terminated or revoked sooner.  Performed at [facility], [place] [address], [place], [place] [address]    XR Foot Complete Left  Result Date: 05/29/2020 2 view radiographs of the left foot shows chronic destructive changes of the left great toe and the left fourth toe secondary to chronic osteomyelitis   Review of Systems  All other systems reviewed and are negative.   There were no vitals taken for this visit. Physical Exam  Patient is alert, oriented, no adenopathy, well-dressed, normal affect, normal respiratory effort. Examination patient has a strong dorsalis pedis pulse he has body skin color changes with venous edema in both lower extremities but no venous ulcers.  He has sausage digit swelling of the great toe and fourth toe left foot.  There is massive ulceration to both toes with exposed bone.  Radiograph shows destructive bony changes of the great toe and fourth toe.  Lungs Clear heart RRR Assessment/Plan Plan: Patient has chronic osteomyelitis with abscess ulceration and purulent drainage from the great toe and fourth toe.  Discussed that with patient's large size his best option is to proceed with a transmetatarsal amputation.  We will plan for wound VAC postoperatively.  Patient states that he lives alone we will need to keep him overnight for observation.  Discussed the importance of nonweightbearing after surgery.  Patient requests a prescription for Percocet to be called in at this time so he has pain medicine before and after surgery.  Bevely Palmer Lakai Moree, PA 05/30/2020, 6:10 AM

## 2020-05-30 NOTE — Anesthesia Procedure Notes (Signed)
Anesthesia Regional Block: Popliteal block   Pre-Anesthetic Checklist: ,, timeout performed, Correct Patient, Correct Site, Correct Laterality, Correct Procedure, Correct Position, site marked, Risks and benefits discussed,  Surgical consent,  Pre-op evaluation,  At surgeon's request and post-op pain management  Laterality: Left  Prep: chloraprep       Needles:  Injection technique: Single-shot  Needle Type: Echogenic Stimulator Needle     Needle Length: 9cm  Needle Gauge: 21     Additional Needles:   Procedures:,,,, ultrasound used (permanent image in chart),,,,  Narrative:  Start time: 05/30/2020 10:20 AM End time: 05/30/2020 10:28 AM Injection made incrementally with aspirations every 5 mL.  Performed by: Personally  Anesthesiologist: Effie Berkshire, MD  Additional Notes: Patient tolerated the procedure well. Local anesthetic introduced in an incremental fashion under minimal resistance after negative aspirations. No paresthesias were elicited. After completion of the procedure, no acute issues were identified and patient continued to be monitored by RN.

## 2020-05-30 NOTE — Transfer of Care (Signed)
Immediate Anesthesia Transfer of Care Note  Patient: [name].  Procedure(s) Performed: LEFT TRANSMETATARSAL AMPUTATION (Left Foot) APPLICATION OF WOUND VAC (Left Foot)  Patient Location: PACU  Anesthesia Type:GA combined with regional for post-op pain  Level of Consciousness: drowsy and patient cooperative  Airway & Oxygen Therapy: Patient Spontanous Breathing  Post-op Assessment: Report given to RN and Post -op Vital signs reviewed and stable  Post vital signs: Reviewed and stable  Last Vitals:  Vitals Value Taken Time  BP    Temp    Pulse 68 05/30/20 1224  Resp    SpO2 97 % 05/30/20 1224  Vitals shown include unvalidated device data.  Last Pain:  Vitals:   05/30/20 0901  PainSc: 6       Patients Stated Pain Goal: 2 (71/24/58 0998)  Complications: No complications documented.

## 2020-05-31 ENCOUNTER — Encounter (HOSPITAL_COMMUNITY): Payer: Self-pay | Admitting: Orthopedic Surgery

## 2020-05-31 DIAGNOSIS — Z89411 Acquired absence of right great toe: Secondary | ICD-10-CM | POA: Diagnosis not present

## 2020-05-31 DIAGNOSIS — L97519 Non-pressure chronic ulcer of other part of right foot with unspecified severity: Secondary | ICD-10-CM | POA: Diagnosis not present

## 2020-05-31 DIAGNOSIS — F17210 Nicotine dependence, cigarettes, uncomplicated: Secondary | ICD-10-CM | POA: Diagnosis not present

## 2020-05-31 DIAGNOSIS — J449 Chronic obstructive pulmonary disease, unspecified: Secondary | ICD-10-CM | POA: Diagnosis not present

## 2020-05-31 DIAGNOSIS — L02612 Cutaneous abscess of left foot: Secondary | ICD-10-CM | POA: Diagnosis not present

## 2020-05-31 DIAGNOSIS — L97529 Non-pressure chronic ulcer of other part of left foot with unspecified severity: Secondary | ICD-10-CM | POA: Diagnosis not present

## 2020-05-31 DIAGNOSIS — M86272 Subacute osteomyelitis, left ankle and foot: Secondary | ICD-10-CM | POA: Diagnosis not present

## 2020-05-31 DIAGNOSIS — Z905 Acquired absence of kidney: Secondary | ICD-10-CM | POA: Diagnosis not present

## 2020-05-31 DIAGNOSIS — N189 Chronic kidney disease, unspecified: Secondary | ICD-10-CM | POA: Diagnosis not present

## 2020-05-31 DIAGNOSIS — I129 Hypertensive chronic kidney disease with stage 1 through stage 4 chronic kidney disease, or unspecified chronic kidney disease: Secondary | ICD-10-CM | POA: Diagnosis not present

## 2020-05-31 NOTE — Evaluation (Addendum)
Occupational Therapy Evaluation Patient Details Name: [name]. MRN: [id number] DOB: 11/01/1956 Today's Date: 05/31/2020    History of Present Illness Pt is a 64 y/o male s/p L TMA. PMH including but not limited to COPD, HTN and neuropathy.   Clinical Impression   PTA pt living alone, functioning at mod I level with use of cane. At time of eval, pt presents with ability to complete transfers at min guard level with RW. Pt adamant on using crutches at home, despite education on RW being safest option. Reviewed w/c use as needed to maintain precautions/days of increased pain. Pt can complete LB BADL at mod I level. Reviewed safe shower transfer, dressing, and IADLs in home environment. Fall risks reviewed as well. No further OT needs identified, OT will sign off. Thank you for this consult.     Follow Up Recommendations  No OT follow up;Supervision - Intermittent    Equipment Recommendations  Wheelchair (measurements OT);Wheelchair cushion (measurements OT)    Recommendations for Other Services       Precautions / Restrictions Precautions Precautions: Fall Precaution Comments: wound VAC Restrictions Weight Bearing Restrictions: Yes LLE Weight Bearing: Non weight bearing      Mobility Bed Mobility               General bed mobility comments: up in chair  Transfers Overall transfer level: Needs assistance Equipment used: Rolling walker (2 wheeled) Transfers: Sit to/from [facility] Sit to Stand: Min guard Stand pivot transfers: Min guard       General transfer comment: min guard for safety and cueing for WB precuations    Balance Overall balance assessment: Needs assistance Sitting-balance support: No upper extremity supported Sitting balance-Leahy Scale: Good     Standing balance support: Bilateral upper extremity supported;Single extremity supported Standing balance-Leahy Scale: Poor                             ADL  either performed or assessed with clinical judgement   ADL Overall ADL's : Modified independent                                       General ADL Comments: Pt presents with ability to complete BADL at mod I level. Educated pt on safe showering, dressing, and IADL with current precautions. Pt has shower chair at home. He is able to don/doff darco shoe independently. Reviewed how to safely engage in cooking and higher level ADLs with current precautions as well     Vision Patient Visual Report: No change from baseline       Perception     Praxis      Pertinent Vitals/Pain Pain Assessment: No/denies pain     Hand Dominance     Extremity/Trunk Assessment Upper Extremity Assessment Upper Extremity Assessment: Overall WFL for tasks assessed   Lower Extremity Assessment Lower Extremity Assessment: Defer to PT evaluation       Communication Communication Communication: No difficulties   Cognition Arousal/Alertness: Awake/alert Behavior During Therapy: WFL for tasks assessed/performed Overall Cognitive Status: Within Functional Limits for tasks assessed                                     General Comments       Exercises  Shoulder Instructions      Home Living Family/patient expects to be discharged to:: Private residence Living Arrangements: Alone Available Help at Discharge: Family;Available 24 hours/day;Friend(s) Type of Home: House Home Access: Stairs to enter [employer] of Steps: 2   Home Layout: One level     Bathroom Shower/Tub: Occupational psychologist: Handicapped height     Home Equipment: Garment/textile technologist - single point          Prior Functioning/Environment Level of Independence: Independent with assistive device(s)        Comments: ambulates with use of a cane at baseline, otherwise mod I with BADLs        OT Problem List: Decreased knowledge of use of DME or AE;Decreased  knowledge of precautions;Impaired balance (sitting and/or standing);Decreased activity tolerance      OT Treatment/Interventions:      OT Goals(Current goals can be found in the care plan section) Acute Rehab OT Goals Patient Stated Goal: home today OT Goal Formulation: All assessment and education complete, DC therapy  OT Frequency:     Barriers to D/C:            Co-evaluation              AM-PAC OT "6 Clicks" Daily Activity     Outcome Measure Help from another person eating meals?: None Help from another person taking care of personal grooming?: None Help from another person toileting, which includes using toliet, bedpan, or urinal?: None Help from another person bathing (including washing, rinsing, drying)?: None Help from another person to put on and taking off regular upper body clothing?: None Help from another person to put on and taking off regular lower body clothing?: None 6 Click Score: 24   End of Session Equipment Utilized During Treatment: Rolling walker Nurse Communication: Mobility status  Activity Tolerance: Patient tolerated treatment well Patient left: in chair;with call bell/phone within reach  OT Visit Diagnosis: Other abnormalities of gait and mobility (R26.89);Unsteadiness on feet (R26.81)                Time: 4497-5300 OT Time Calculation (min): 13 min Charges:  OT General Charges $OT Visit: 1 Visit OT Evaluation $OT Eval Low Complexity: 1 Low  Zenovia Jarred, MSOT, OTR/L [facility] Office Number: [phone] Pager: [phone]  Zenovia Jarred 05/31/2020, 10:56 AM

## 2020-05-31 NOTE — Plan of Care (Signed)

## 2020-05-31 NOTE — Progress Notes (Addendum)
D/C instructions given and verbalized understanding. Provena wound vac connected and education provided. IV d/D'd. Discharged Patient declined w/c and 3 in 1.

## 2020-05-31 NOTE — Progress Notes (Signed)
Physical Therapy Treatment Patient Details Name: [name]. MRN: [id number] DOB: 08/19/1956 Today's Date: 05/31/2020    History of Present Illness Pt is a 64 y/o male s/p L TMA. PMH including but not limited to COPD, HTN and neuropathy.    PT Comments    Pt able to progress with ambulation with RW and post op shoe but will be limited with distance due to inability to keep LLE TDWB. Recommend w/c with elevating leg rests for use in his home as well as community distances. Practiced 5 steps, pt needed min A for safety and reports that he can have a friend meet him at his house when he gets there to assist (he is driving himself home). PT will continue to follow.    Follow Up Recommendations  Home health PT;Supervision/Assistance - 24 hour     Equipment Recommendations  Rolling walker with 5" wheels;Wheelchair (measurements PT);Wheelchair cushion (measurements PT) (w/c with elevating leg rests)    Recommendations for Other Services       Precautions / Restrictions Precautions Precautions: Fall Precaution Comments: wound VAC Restrictions Weight Bearing Restrictions: Yes LLE Weight Bearing: Touchdown weight bearing    Mobility  Bed Mobility Overal bed mobility: Modified Independent Bed Mobility: Supine to Sit     Supine to sit: Modified independent (Device/Increase time)     General bed mobility comments: pt able to come to EOB from flat bed without use of rail, able to sit up into long sitting and slide LE's off bed  Transfers Overall transfer level: Needs assistance Equipment used: Rolling walker (2 wheeled) Transfers: Sit to/from [facility] Sit to Stand: Supervision Stand pivot transfers: Supervision       General transfer comment: supervision for safety and cueing for WB precautions. Donned post op shoe before standing to encourage any WB to be through heel  Ambulation/Gait Ambulation/Gait assistance: Supervision Gait Distance (Feet): 50  Feet Assistive device: Rolling walker (2 wheeled) Gait Pattern/deviations: Step-to pattern Gait velocity: decreased Gait velocity interpretation: <1.8 ft/sec, indicate of risk for recurrent falls General Gait Details: practiced distance since pt will need to do this to get into home but pt not effectively keeping TDWB. Did keep wt in heel with post op shoe.    Stairs Stairs: Yes Stairs assistance: Min assist Stair Management: One rail Left;Step to pattern;Forwards Number of Stairs: 5 General stair comments: used rail but pt will be reaching for door. Recommend that pt have a friend meet him at his house upon d/c to help get up stairs and carry belongings in. Pt successful with stairs. Instructed that once he has enetered home, he needs to sit down and elevate LE before doing anything else.    Wheelchair Mobility    Modified Rankin (Stroke Patients Only)       Balance Overall balance assessment: Needs assistance Sitting-balance support: No upper extremity supported Sitting balance-Leahy Scale: Good     Standing balance support: Bilateral upper extremity supported;Single extremity supported Standing balance-Leahy Scale: Poor Standing balance comment: reliant on UE support for balance and to keep WB status in standing                            Cognition Arousal/Alertness: Awake/alert Behavior During Therapy: WFL for tasks assessed/performed Overall Cognitive Status: Within Functional Limits for tasks assessed  Exercises General Exercises - Lower Extremity Ankle Circles/Pumps: AROM;Both;10 reps;Seated Long Arc Quad: AROM;Left;10 reps;Seated    General Comments        Pertinent Vitals/Pain Pain Assessment: No/denies pain    Home Living Family/patient expects to be discharged to:: Private residence Living Arrangements: Alone Available Help at Discharge: Family;Available 24 hours/day;Friend(s) Type of  Home: House Home Access: Stairs to enter   Home Layout: One level Home Equipment: Garment/textile technologist - single point      Prior Function Level of Independence: Independent with assistive device(s)      Comments: ambulates with use of a cane at baseline, otherwise mod I with BADLs   PT Goals (current goals can now be found in the care plan section) Acute Rehab PT Goals Patient Stated Goal: home today PT Goal Formulation: With patient Time For Goal Achievement: 06/13/20 Potential to Achieve Goals: Good Progress towards PT goals: Progressing toward goals    Frequency    Min 5X/week      PT Plan Current plan remains appropriate    Co-evaluation              AM-PAC PT "6 Clicks" Mobility   Outcome Measure  Help needed turning from your back to your side while in a flat bed without using bedrails?: None Help needed moving from lying on your back to sitting on the side of a flat bed without using bedrails?: None Help needed moving to and from a bed to a chair (including a wheelchair)?: A Little Help needed standing up from a chair using your arms (e.g., wheelchair or bedside chair)?: A Little Help needed to walk in hospital room?: A Little Help needed climbing 3-5 steps with a railing? : A Little 6 Click Score: 20    End of Session Equipment Utilized During Treatment: Gait belt Activity Tolerance: Patient tolerated treatment well Patient left: in chair;with call bell/phone within reach;with chair alarm set Nurse Communication: Mobility status PT Visit Diagnosis: Other abnormalities of gait and mobility (R26.89)     Time: 0822-0902 PT Time Calculation (min) (ACUTE ONLY): 40 min  Charges:  $Gait Training: 23-37 mins $Self Care/Home Management: 8-22                     Leighton Roach, [place]  Pager [phone] Office [place] 05/31/2020, 11:21 AM

## 2020-05-31 NOTE — Plan of Care (Signed)

## 2020-05-31 NOTE — Discharge Summary (Signed)
Discharge Diagnoses:  Active Problems:   Subacute osteomyelitis, left ankle and foot (HCC)   Abscess of left foot   Surgeries: Procedure(s): LEFT TRANSMETATARSAL AMPUTATION APPLICATION OF WOUND VAC on 05/30/2020    Consultants:   Discharged Condition: Improved  Hospital Course: [name]. is an 64 y.o. male who was admitted 05/30/2020 with a chief complaint of osteomyelitis left foot, with a final diagnosis of Osteomyelitis Left Foot.  Patient was brought to the operating room on 05/30/2020 and underwent Procedure(s): LEFT TRANSMETATARSAL AMPUTATION APPLICATION OF WOUND VAC.    Patient was given perioperative antibiotics:  Anti-infectives (From admission, onward)   Start     Dose/Rate Route Frequency Ordered Stop   05/30/20 1245  ceFAZolin (ANCEF) IVPB 2g/100 mL premix        2 g 200 mL/hr over 30 Minutes Intravenous Every 6 hours 05/30/20 1239 05/31/20 0259   05/30/20 0845  ceFAZolin (ANCEF) 3 g in dextrose 5 % 50 mL IVPB        3 g 100 mL/hr over 30 Minutes Intravenous On call to O.R. 05/30/20 0831 05/30/20 1156    .  Patient was given sequential compression devices, early ambulation, and aspirin for DVT prophylaxis.  Recent vital signs:  Patient Vitals for the past 24 hrs:  BP Temp Temp src Pulse Resp SpO2 Height Weight  05/31/20 0344 127/76 97.7 F (36.5 C) -- 76 15 92 % -- --  05/31/20 0024 126/65 98.3 F (36.8 C) Oral 71 17 94 % -- --  05/31/20 0022 136/66 98.3 F (36.8 C) Oral 77 17 90 % -- --  05/30/20 1939 127/62 97.7 F (36.5 C) Oral 73 16 94 % -- --  05/30/20 1355 (!) 141/75 (!) 97.5 F (36.4 C) Oral 67 18 95 % -- --  05/30/20 1353 -- -- -- -- -- -- -- (!) 167.5 kg  05/30/20 1338 138/74 98.4 F (36.9 C) -- 65 19 94 % -- --  05/30/20 1308 126/68 -- -- 67 18 95 % -- --  05/30/20 1238 117/61 -- -- 69 13 95 % -- --  05/30/20 1223 (!) 125/59 97.6 F (36.4 C) -- 69 18 97 % -- --  05/30/20 1029 112/76 -- -- 77 17 98 % -- --  05/30/20 1024 120/68 -- -- 71  (!) 9 98 % -- --  05/30/20 0901 104/88 98.2 F (36.8 C) -- 72 17 97 % 6\' 8"  (2.032 m) (!) 175.5 kg  .  Recent laboratory studies: XR Foot Complete Left  Result Date: 05/29/2020 2 view radiographs of the left foot shows chronic destructive changes of the left great toe and the left fourth toe secondary to chronic osteomyelitis   Discharge Medications:   Allergies as of 05/31/2020   No Known Allergies     Medication List    TAKE these medications   Align 4 MG Caps Take 4 mg by mouth daily.   ALIGN PREBIOTIC-PROBIOTIC PO Take 1 capsule by mouth daily.   atorvastatin 10 MG tablet Commonly known as: LIPITOR Take 10 mg by mouth daily.   docusate sodium 50 MG capsule Commonly known as: COLACE Take 50 mg by mouth daily as needed for mild constipation.   doxycycline 100 MG tablet Commonly known as: VIBRA-TABS Take 100 mg by mouth 2 (two) times daily. Started 05/23/20   DULoxetine 60 MG capsule Commonly known as: CYMBALTA Take 60 mg by mouth daily.   oxyCODONE-acetaminophen 5-325 MG tablet Commonly known as: PERCOCET/ROXICET Take 1 tablet by  mouth every 4 (four) hours as needed for severe pain. What changed: reasons to take this   pregabalin 150 MG capsule Commonly known as: Lyrica Take 1 capsule (150 mg total) by mouth 3 (three) times daily. What changed:   how much to take  when to take this   ProAir HFA 108 (90 Base) MCG/ACT inhaler Generic drug: albuterol Inhale 2 puffs into the lungs every 6 (six) hours as needed (for wheezing/shortness of breath.).   Symbicort 160-4.5 MCG/ACT inhaler Generic drug: budesonide-formoterol Inhale 2 puffs into the lungs 2 (two) times daily.   tamsulosin 0.4 MG Caps capsule Commonly known as: FLOMAX Take 0.4 mg by mouth 2 (two) times daily.   varenicline 1 MG tablet Commonly known as: CHANTIX Take 1 mg by mouth 2 (two) times daily.   Voltaren 1 % Gel Generic drug: diclofenac Sodium Apply 1 application topically 4 (four) times  daily as needed (pain.).            Discharge Care Instructions  (From admission, onward)         Start     Ordered   05/31/20 0000  Touch down weight bearing       Question Answer Comment  Laterality left   Extremity Lower      05/31/20 0832          Diagnostic Studies: XR Foot Complete Left  Result Date: 05/29/2020 2 view radiographs of the left foot shows chronic destructive changes of the left great toe and the left fourth toe secondary to chronic osteomyelitis   Patient benefited maximally from their hospital stay and there were no complications.     Disposition: Discharge disposition: 01-Home or Self Care      Discharge Instructions    Call MD / Call 911   Complete by: As directed    If you experience chest pain or shortness of breath, CALL 911 and be transported to the hospital emergency room.  If you develope a fever above 101 F, pus (white drainage) or increased drainage or redness at the wound, or calf pain, call your surgeon's office.   Call MD / Call 911   Complete by: As directed    If you experience chest pain or shortness of breath, CALL 911 and be transported to the hospital emergency room.  If you develope a fever above 101 F, pus (white drainage) or increased drainage or redness at the wound, or calf pain, call your surgeon's office.   Constipation Prevention   Complete by: As directed    Drink plenty of fluids.  Prune juice may be helpful.  You may use a stool softener, such as Colace (over the counter) 100 mg twice a day.  Use MiraLax (over the counter) for constipation as needed.   Constipation Prevention   Complete by: As directed    Drink plenty of fluids.  Prune juice may be helpful.  You may use a stool softener, such as Colace (over the counter) 100 mg twice a day.  Use MiraLax (over the counter) for constipation as needed.   Diet - low sodium heart healthy   Complete by: As directed    Diet - low sodium heart healthy   Complete by: As  directed    Discharge instructions   Complete by: As directed    Elevate left foot keep dressing dry Touchdown weightbearing   Increase activity slowly as tolerated   Complete by: As directed    Increase activity slowly as tolerated  Complete by: As directed    Negative Pressure Wound Therapy - Incisional   Complete by: As directed    Show patient how to attach prevena vac   Touch down weight bearing   Complete by: As directed    Laterality: left   Extremity: Lower      Follow-up Information    Persons, Bevely Palmer, PA In 1 week.   Specialty: Orthopedic Surgery Contact information: [address] [place] [address] [phone]                Signed: Newt Minion 05/31/2020, 8:32 AM

## 2020-05-31 NOTE — Care Management (Cosign Needed)
    Durable Medical Equipment  (From admission, onward)         Start     Ordered   05/31/20 0836  For home use only DME Walker rolling  Once       Question Answer Comment  Walker: With 5 Inch Wheels   Patient needs a walker to treat with the following condition S/P transmetatarsal amputation of foot ([place])      05/31/20 1517   05/31/20 0836  For home use only DME 3 n 1  Once        05/31/20 0836   05/31/20 0835  For home use only DME lightweight manual wheelchair with seat cushion  Once       Comments: Patient suffers from transmetatarsal amputation which impairs their ability to perform daily activities like ADLs in the home.  A walker will not resolve  issue with performing activities of daily living. A wheelchair will allow patient to safely perform daily activities. Patient is not able to propel themselves in the home using a standard weight wheelchair due to weakness. Patient can self propel in the lightweight wheelchair. Length of need lifetime. Accessories: elevating leg rests (ELRs), wheel locks, extensions, back cushion, amputee leg rest and anti-tippers.   05/31/20 6160

## 2020-05-31 NOTE — Progress Notes (Signed)
Patient ID: Cory Spates., male   DOB: 02/04/1956, 64 y.o.   MRN: [id number] Plan for discharge today with the portable wound VAC pump.  Touchdown weightbearing on the left with home health physical therapy and DME.  Patient states he has no pain at this time.  25 cc in the wound VAC canister.

## 2020-05-31 NOTE — TOC Transition Note (Signed)
Transition of Care [facility]) - CM/SW Discharge Note   Patient Details  Name: [name]. MRN: [id number] Date of Birth: December 30, 1955  Transition of Care [facility]) CM/SW Contact:  Claudie Leach, RN 05/31/2020, 11:21 AM   Clinical Narrative:    Patient to d/c home with [facility] & DME 3n1, RW, and WC.  Patient agreeable to all.    DME will be delivered to room prior to d/c.  Cindie with [facility] accepted referral for [facility] PT and HHA.    Final next level of care: [place] Barriers to Discharge: No Barriers Identified   Patient Goals and CMS Choice Patient states their goals for this hospitalization and ongoing recovery are:: to get home CMS Medicare.gov Compare Post Acute Care list provided to:: Patient Choice offered to / list presented to : Patient   Discharge Plan and Services                DME Arranged: 3-N-1, Lightweight manual wheelchair with seat cushion, Walker rolling DME Agency: [employer] Date DME Agency Contacted: 05/31/20 Time DME Agency Contacted: 0900 Representative spoke with at DME Agency: Bertrum Sol HH Arranged: PT, Nurse's Aide [place] Agency: [place] Date [place]: 05/31/20 Time [place]: 0930 Representative spoke with at [place]: Cindie

## 2020-05-31 NOTE — Progress Notes (Signed)
Patient suffers from left transmetatarsal amputation which impairs their ability to perform daily activities like ambulation and ADL's in the home.  A walker alone will not resolve the issues with performing activities of daily living. A wheelchair will allow patient to safely perform daily activities.  The patient can self propel in the home or has a caregiver who can provide assistance.       Wyona Almas, PT, DPT Acute Rehabilitation Services Pager [phone] Office [phone]

## 2020-05-31 NOTE — Progress Notes (Signed)
Additional PT  Note   Patient suffers from L transmet amputation which impairs their ability to perform daily activities like preparing a meal and being able to ambulate room to room keeping LLE TDWB in the home.  A walker alone will not resolve the issues with performing activities of daily living. A lightweight wheelchair with elevating leg rests will allow patient to safely perform daily activities.  The patient cannot self propel a standard w/c in the home due to UE weakness but he will be able to self propel a lightweight wheelchair.     Leighton Roach, [place]  Pager [phone] Office [phone]

## 2020-05-31 NOTE — Plan of Care (Signed)
  Problem: Education: Goal: Knowledge of General Education information will improve Description: Including pain rating scale, medication(s)/side effects and non-pharmacologic comfort measures 05/31/2020 0854 by Melina Schools, RN Outcome: Adequate for Discharge 05/31/2020 0728 by Melina Schools, RN Outcome: Progressing   Problem: Health Behavior/Discharge Planning: Goal: Ability to manage health-related needs will improve 05/31/2020 0854 by Melina Schools, RN Outcome: Adequate for Discharge 05/31/2020 0728 by Melina Schools, RN Outcome: Progressing   Problem: Clinical Measurements: Goal: Ability to maintain clinical measurements within normal limits will improve Outcome: Adequate for Discharge Goal: Will remain free from infection Outcome: Adequate for Discharge Goal: Diagnostic test results will improve Outcome: Adequate for Discharge Goal: Respiratory complications will improve Outcome: Adequate for Discharge Goal: Cardiovascular complication will be avoided Outcome: Adequate for Discharge   Problem: Activity: Goal: Risk for activity intolerance will decrease Outcome: Adequate for Discharge   Problem: Nutrition: Goal: Adequate nutrition will be maintained Outcome: Adequate for Discharge   Problem: Coping: Goal: Level of anxiety will decrease Outcome: Adequate for Discharge   Problem: Elimination: Goal: Will not experience complications related to bowel motility Outcome: Adequate for Discharge Goal: Will not experience complications related to urinary retention Outcome: Adequate for Discharge   Problem: Pain Managment: Goal: General experience of comfort will improve 05/31/2020 0854 by Melina Schools, RN Outcome: Adequate for Discharge 05/31/2020 0728 by Melina Schools, RN Outcome: Progressing   Problem: Safety: Goal: Ability to remain free from injury will improve 05/31/2020 0854 by Melina Schools, RN Outcome: Adequate for Discharge 05/31/2020  0728 by Melina Schools, RN Outcome: Progressing   Problem: Skin Integrity: Goal: Risk for impaired skin integrity will decrease Outcome: Adequate for Discharge

## 2020-06-03 ENCOUNTER — Other Ambulatory Visit: Payer: Self-pay | Admitting: Physician Assistant

## 2020-06-03 ENCOUNTER — Telehealth: Payer: Self-pay | Admitting: Orthopedic Surgery

## 2020-06-03 ENCOUNTER — Telehealth: Payer: Self-pay | Admitting: Radiology

## 2020-06-03 DIAGNOSIS — L97526 Non-pressure chronic ulcer of other part of left foot with bone involvement without evidence of necrosis: Secondary | ICD-10-CM | POA: Diagnosis not present

## 2020-06-03 DIAGNOSIS — J449 Chronic obstructive pulmonary disease, unspecified: Secondary | ICD-10-CM | POA: Diagnosis not present

## 2020-06-03 DIAGNOSIS — N189 Chronic kidney disease, unspecified: Secondary | ICD-10-CM | POA: Diagnosis not present

## 2020-06-03 DIAGNOSIS — Z4781 Encounter for orthopedic aftercare following surgical amputation: Secondary | ICD-10-CM | POA: Diagnosis not present

## 2020-06-03 DIAGNOSIS — Z4801 Encounter for change or removal of surgical wound dressing: Secondary | ICD-10-CM | POA: Diagnosis not present

## 2020-06-03 DIAGNOSIS — I129 Hypertensive chronic kidney disease with stage 1 through stage 4 chronic kidney disease, or unspecified chronic kidney disease: Secondary | ICD-10-CM | POA: Diagnosis not present

## 2020-06-03 DIAGNOSIS — G629 Polyneuropathy, unspecified: Secondary | ICD-10-CM | POA: Diagnosis not present

## 2020-06-03 DIAGNOSIS — M86672 Other chronic osteomyelitis, left ankle and foot: Secondary | ICD-10-CM | POA: Diagnosis not present

## 2020-06-03 DIAGNOSIS — K5792 Diverticulitis of intestine, part unspecified, without perforation or abscess without bleeding: Secondary | ICD-10-CM | POA: Diagnosis not present

## 2020-06-03 MED ORDER — OXYCODONE-ACETAMINOPHEN 5-325 MG PO TABS: 1.0000 | ORAL_TABLET | ORAL | 0 refills | Status: DC | PRN

## 2020-06-03 NOTE — Telephone Encounter (Signed)
Please advise, thank you.

## 2020-06-03 NOTE — Telephone Encounter (Signed)
Patient called.   He said he was told to call if he felt he needed pain medication so he is now requesting it.   Call back: [phone]

## 2020-06-03 NOTE — Telephone Encounter (Signed)
Verdene Lennert - [place] Case Manager, RN called stated she called to check in on patient after his surgery (05/30/20 left transmet amputation). States patient turned off his wound vac due to continuous beeping. Patient states no home health nurse coming to home. Patient is scheduled to come in on Friday 7/9.

## 2020-06-03 NOTE — Telephone Encounter (Signed)
Refill called in. 

## 2020-06-03 NOTE — Telephone Encounter (Signed)
Attempted to contact patient. 1st call busy 2nd call left voicemail - to call office and make sooner appointment, tomorrow if possible.

## 2020-06-04 ENCOUNTER — Ambulatory Visit (INDEPENDENT_AMBULATORY_CARE_PROVIDER_SITE_OTHER): Payer: Medicare PPO | Admitting: Family

## 2020-06-04 ENCOUNTER — Encounter: Payer: Self-pay | Admitting: Family

## 2020-06-04 ENCOUNTER — Other Ambulatory Visit: Payer: Self-pay

## 2020-06-04 ENCOUNTER — Telehealth: Payer: Self-pay | Admitting: Orthopedic Surgery

## 2020-06-04 VITALS — Ht >= 80 in | Wt 369.0 lb

## 2020-06-04 DIAGNOSIS — Z89432 Acquired absence of left foot: Secondary | ICD-10-CM

## 2020-06-04 NOTE — Progress Notes (Signed)
Post-Op Visit Note   Patient: [name].           Date of Birth: [birth year]           MRN: [id number] Visit Date: 06/04/2020 PCP: [name], MD  Chief Complaint:  Chief Complaint  Patient presents with  . Left Foot - Routine Post Op    05/30/20 left transmet amputation     HPI:  HPI The patient is a 64 year old gentleman seen today status post left transmetatarsal amputation on July 2 unfortunately his wound VAC did stop working yesterday.  He states that he has been weightbearing with a walker but he is more concerned about following with a walker would like to transition to a cane.  He is currently in a postop shoe doing is minimal weightbearing as he can around the home.  Ortho Exam Incision well approximated sutures there is surrounding maceration some odor from all the moisture.  There is no erythema no gaping no sign of infection  Visit Diagnoses: No diagnosis found.  Plan: Begin daily Dial soap cleansing.  Dry dressing changes elevate.  Discussed importance of nonweightbearing.  Follow-Up Instructions: No follow-ups on file.   Imaging: No results found.  Orders:  No orders of the defined types were placed in this encounter.  No orders of the defined types were placed in this encounter.    PMFS History: Patient Active Problem List   Diagnosis Date Noted  . Abscess of left foot 05/30/2020  . Subacute osteomyelitis, left ankle and foot ([place])   . Skin ulcers of both feet ([place]) 10/24/2017  . Colovesical fistula   . Benign neoplasm of cecum   . Chronic venous insufficiency 07/20/2017  . Chronic left-sided low back pain without sciatica 11/08/2016  . Toe ulcer ([place]) 03/08/2016  . Verruca 12/28/2015  . Pre-ulcerative calluses 12/28/2015  . History of adenomatous polyp of colon 06/09/2015  . Lipoma 03/13/2014  . Plantar warts 07/09/2012  . Cellulitis 07/04/2012  . Neuropathy 07/04/2012  . Hyperglycemia 07/04/2012  . HYPERTENSION, BENIGN  10/20/2010  . EDEMA 08/20/2010  . WISDOM TEETH EXTRACTION, HX OF 08/20/2010  . HYPOALBUMINEMIA 08/19/2010  . ANEMIA 08/19/2010  . NICOTINE ADDICTION 08/19/2010  . NECROTIZING PNEUMONIA 08/19/2010  . OSTEOARTHRITIS 08/19/2010   Past Medical History:  Diagnosis Date  . Arthritis   . Chronic kidney disease    mass r kidney - partial nephrectomy  . COPD (chronic obstructive pulmonary disease) ([place])   . Diverticulitis   . History of kidney stones   . Hx of adenomatous colonic polyps 06/09/2015  . Hyperlipidemia   . Hypertension    has previously been on medication but then taken off - 05/29/20 pt states he's never been treated for HTN  . Neuromuscular disorder (HCC)    neuropathy lower legs and feet  . Neuropathy    in both feet  . Pneumonia 2011    Family History  Problem Relation Age of Onset  . Heart disease Mother   . Esophageal cancer Mother   . Diabetes Father   . Pancreatic cancer Father   . Breast cancer Sister   . Colon cancer Neg Hx   . Rectal cancer Neg Hx   . Stomach cancer Neg Hx   . Colon polyps Neg Hx     Past Surgical History:  Procedure Laterality Date  . AMPUTATION Bilateral 06/05/2013   Procedure: RIGHT GREAT TOE AMPUTATION/LEFT GREAT TOE DEBRIDEMENT;  Surgeon: Wylene Simmer, MD;  Location:  [place] [facility];  Service: Orthopedics;  Laterality: Bilateral;  . AMPUTATION Left 05/30/2020   Procedure: LEFT TRANSMETATARSAL AMPUTATION;  Surgeon: Newt Minion, MD;  Location: [place];  Service: Orthopedics;  Laterality: Left;  . APPLICATION OF WOUND VAC Left 05/30/2020   Procedure: APPLICATION OF WOUND VAC;  Surgeon: Newt Minion, MD;  Location: [place];  Service: Orthopedics;  Laterality: Left;  . COLON SURGERY     robotic partial colectomy Dr. Marcello Moores 12-30-17  . COLONOSCOPY     x 2 last date 09/21/2017 bladder attached to colon eval  . COLONOSCOPY WITH PROPOFOL N/A 09/21/2017   Procedure: COLONOSCOPY WITH PROPOFOL;  Surgeon: [name], MD;  Location: [facility];  Service:  Endoscopy;  Laterality: N/A;  . MOUTH SURGERY    . PARTIAL NEPHRECTOMY     right Dr. Leighton Ruff 05-02-83  . ROBOTIC ASSITED PARTIAL NEPHRECTOMY Right 12/30/2017   Procedure: XI ROBOTIC ASSITED RIGHT PARTIAL NEPHRECTOMY;  Surgeon: Alexis Frock, MD;  Location: [facility];  Service: Urology;  Laterality: Right;  . TOE AMPUTATION Right 06/05/2013   RIGHT GREAT TOE PARTIAL AMPUTATION    . TOOTH EXTRACTION  2011   multiple teeth extracted   Social History   Occupational History  . Occupation: retired  Tobacco Use  . Smoking status: Current Some Day Smoker    Packs/day: 0.10    Years: 20.00    Pack years: 2.00    Types: Cigarettes    Start date: 11/29/1973  . Smokeless tobacco: Never Used  . Tobacco comment: occasional smoker - ~1 pack/week. smoked ~1.5ppd until 5 yr ago  Vaping Use  . Vaping Use: Never used  Substance and Sexual Activity  . Alcohol use: No    Alcohol/week: 0.0 standard drinks  . Drug use: No  . Sexual activity: Not Currently    Partners: Female

## 2020-06-04 NOTE — Telephone Encounter (Signed)
Pt is s/p a left transmet amputation on 05/30/20 pt has an appt today will discuss weight bearing status and update orders at that time

## 2020-06-04 NOTE — Telephone Encounter (Signed)
I called and lm on vm to advise that the pt was in the office today and that he had been advised that he should not be wtb and he states that this is not possible that he needs to take care of himself and needs to be wtb at some point to prepare his meals and use the restroom. Pt does not want to use the walker as he was encouraged he wants to use the cane. Advised that he should wtb through heel and that he should limit the amount of time he is on his foot. To call with any questions.

## 2020-06-04 NOTE — Telephone Encounter (Signed)
Received call from Erich Montane (PT) with [facility] needing verbal orders for HHPT 2 Wk 2 and 1 Wk 5. Roselie Awkward also need clarification on weight bearing status. Roselie Awkward said patient was using his cane and he instructed patient to use his walker. Roselie Awkward also verbal order to add skilled nursing. The number to contact Roselie Awkward is [phone]

## 2020-06-05 DIAGNOSIS — G629 Polyneuropathy, unspecified: Secondary | ICD-10-CM | POA: Diagnosis not present

## 2020-06-05 DIAGNOSIS — M86672 Other chronic osteomyelitis, left ankle and foot: Secondary | ICD-10-CM | POA: Diagnosis not present

## 2020-06-05 DIAGNOSIS — K5792 Diverticulitis of intestine, part unspecified, without perforation or abscess without bleeding: Secondary | ICD-10-CM | POA: Diagnosis not present

## 2020-06-05 DIAGNOSIS — I129 Hypertensive chronic kidney disease with stage 1 through stage 4 chronic kidney disease, or unspecified chronic kidney disease: Secondary | ICD-10-CM | POA: Diagnosis not present

## 2020-06-05 DIAGNOSIS — N189 Chronic kidney disease, unspecified: Secondary | ICD-10-CM | POA: Diagnosis not present

## 2020-06-05 DIAGNOSIS — J449 Chronic obstructive pulmonary disease, unspecified: Secondary | ICD-10-CM | POA: Diagnosis not present

## 2020-06-05 DIAGNOSIS — Z4801 Encounter for change or removal of surgical wound dressing: Secondary | ICD-10-CM | POA: Diagnosis not present

## 2020-06-05 DIAGNOSIS — L97526 Non-pressure chronic ulcer of other part of left foot with bone involvement without evidence of necrosis: Secondary | ICD-10-CM | POA: Diagnosis not present

## 2020-06-05 DIAGNOSIS — Z4781 Encounter for orthopedic aftercare following surgical amputation: Secondary | ICD-10-CM | POA: Diagnosis not present

## 2020-06-06 ENCOUNTER — Ambulatory Visit: Payer: Medicare PPO | Admitting: Family

## 2020-06-07 DIAGNOSIS — Z4801 Encounter for change or removal of surgical wound dressing: Secondary | ICD-10-CM | POA: Diagnosis not present

## 2020-06-07 DIAGNOSIS — N189 Chronic kidney disease, unspecified: Secondary | ICD-10-CM | POA: Diagnosis not present

## 2020-06-07 DIAGNOSIS — L97526 Non-pressure chronic ulcer of other part of left foot with bone involvement without evidence of necrosis: Secondary | ICD-10-CM | POA: Diagnosis not present

## 2020-06-07 DIAGNOSIS — K5792 Diverticulitis of intestine, part unspecified, without perforation or abscess without bleeding: Secondary | ICD-10-CM | POA: Diagnosis not present

## 2020-06-07 DIAGNOSIS — I129 Hypertensive chronic kidney disease with stage 1 through stage 4 chronic kidney disease, or unspecified chronic kidney disease: Secondary | ICD-10-CM | POA: Diagnosis not present

## 2020-06-07 DIAGNOSIS — G629 Polyneuropathy, unspecified: Secondary | ICD-10-CM | POA: Diagnosis not present

## 2020-06-07 DIAGNOSIS — J449 Chronic obstructive pulmonary disease, unspecified: Secondary | ICD-10-CM | POA: Diagnosis not present

## 2020-06-07 DIAGNOSIS — M86672 Other chronic osteomyelitis, left ankle and foot: Secondary | ICD-10-CM | POA: Diagnosis not present

## 2020-06-07 DIAGNOSIS — Z4781 Encounter for orthopedic aftercare following surgical amputation: Secondary | ICD-10-CM | POA: Diagnosis not present

## 2020-06-10 ENCOUNTER — Other Ambulatory Visit: Payer: Self-pay | Admitting: Physician Assistant

## 2020-06-10 ENCOUNTER — Ambulatory Visit (INDEPENDENT_AMBULATORY_CARE_PROVIDER_SITE_OTHER): Payer: Medicare PPO | Admitting: Physician Assistant

## 2020-06-10 ENCOUNTER — Other Ambulatory Visit: Payer: Self-pay

## 2020-06-10 ENCOUNTER — Encounter: Payer: Self-pay | Admitting: Physician Assistant

## 2020-06-10 VITALS — Ht >= 80 in | Wt 369.0 lb

## 2020-06-10 DIAGNOSIS — Z4781 Encounter for orthopedic aftercare following surgical amputation: Secondary | ICD-10-CM | POA: Diagnosis not present

## 2020-06-10 DIAGNOSIS — M869 Osteomyelitis, unspecified: Secondary | ICD-10-CM | POA: Diagnosis not present

## 2020-06-10 DIAGNOSIS — Z4801 Encounter for change or removal of surgical wound dressing: Secondary | ICD-10-CM | POA: Diagnosis not present

## 2020-06-10 DIAGNOSIS — E1159 Type 2 diabetes mellitus with other circulatory complications: Secondary | ICD-10-CM | POA: Diagnosis not present

## 2020-06-10 DIAGNOSIS — K5792 Diverticulitis of intestine, part unspecified, without perforation or abscess without bleeding: Secondary | ICD-10-CM | POA: Diagnosis not present

## 2020-06-10 DIAGNOSIS — L0390 Cellulitis, unspecified: Secondary | ICD-10-CM | POA: Diagnosis not present

## 2020-06-10 DIAGNOSIS — G609 Hereditary and idiopathic neuropathy, unspecified: Secondary | ICD-10-CM | POA: Diagnosis not present

## 2020-06-10 DIAGNOSIS — L97526 Non-pressure chronic ulcer of other part of left foot with bone involvement without evidence of necrosis: Secondary | ICD-10-CM | POA: Diagnosis not present

## 2020-06-10 DIAGNOSIS — M86272 Subacute osteomyelitis, left ankle and foot: Secondary | ICD-10-CM

## 2020-06-10 DIAGNOSIS — I129 Hypertensive chronic kidney disease with stage 1 through stage 4 chronic kidney disease, or unspecified chronic kidney disease: Secondary | ICD-10-CM | POA: Diagnosis not present

## 2020-06-10 DIAGNOSIS — Z89429 Acquired absence of other toe(s), unspecified side: Secondary | ICD-10-CM | POA: Diagnosis not present

## 2020-06-10 DIAGNOSIS — N189 Chronic kidney disease, unspecified: Secondary | ICD-10-CM | POA: Diagnosis not present

## 2020-06-10 DIAGNOSIS — M86672 Other chronic osteomyelitis, left ankle and foot: Secondary | ICD-10-CM | POA: Diagnosis not present

## 2020-06-10 DIAGNOSIS — J449 Chronic obstructive pulmonary disease, unspecified: Secondary | ICD-10-CM | POA: Diagnosis not present

## 2020-06-10 DIAGNOSIS — G629 Polyneuropathy, unspecified: Secondary | ICD-10-CM | POA: Diagnosis not present

## 2020-06-10 MED ORDER — SULFAMETHOXAZOLE-TRIMETHOPRIM 800-160 MG PO TABS: 1.0000 | ORAL_TABLET | Freq: Two times a day (BID) | ORAL | 0 refills | Status: DC

## 2020-06-10 MED ORDER — OXYCODONE-ACETAMINOPHEN 5-325 MG PO TABS: 1.0000 | ORAL_TABLET | ORAL | 0 refills | Status: DC | PRN

## 2020-06-10 NOTE — Progress Notes (Signed)
Office Visit Note   Patient: [name].           Date of Birth: 1955-12-02           MRN: [id number] Visit Date: 06/10/2020              Requested by: [name], MD [address] [place],  [place] [address] PCP: [name], MD  Chief Complaint  Patient presents with  . Left Foot - Routine Post Op    05/30/20 left transmet amputation         HPI: This is a pleasant 64 year old gentleman who is 11 days status post left transmetatarsal amputation.  He comes in today because his nurse practitioner was concerned that he had increased drainage and a foul odor over his amputation stump.  Also seemed red to her.  He feels that the swelling has improved.  He was on doxycycline which she completed last week he is smoking some per his description he is ambulating with a cane  Assessment & Plan: Visit Diagnoses: No diagnosis found.  Plan: He is going to try and elevate more emphasized the importance of staying off of his foot is much as possible.  We will call in a prescription for Septra DS for him.  Will do daily dry dressing changes and cleanse with antibiotic soap and water.  Follow-up in 1 week  Follow-Up Instructions: No follow-ups on file.   Ortho Exam  Patient is alert, oriented, no adenopathy, well-dressed, normal affect, normal respiratory effort. Examination of his left transmetatarsal amputation stump sutures are in place.  He does have some minimal dehiscence.  No necrotic wound edges some bloody serous drainage.  He does have erythema extending up onto the dorsum of his foot.  Moderate soft tissue swelling dorsalis pedis pulses easily palpable he has no ascending cellulitis but he does have some swelling extending up into his leg compartments are soft and compressible  Imaging: No results found. No images are attached to the encounter.  Labs: Lab Results  Component Value Date   HGBA1C 6.0 (H) 08/23/2017   HGBA1C 5.3 03/12/2014   HGBA1C 5.4  07/04/2012   ESRSEDRATE 14 06/14/2017   ESRSEDRATE 30 05/17/2017   ESRSEDRATE 23 (H) 01/31/2015   CRP 23.4 (H) 06/14/2017   CRP 37.4 (H) 05/17/2017   CRP 55.8 (H) 03/29/2017   REPTSTATUS 09/17/2010 FINAL 08/03/2010   GRAMSTAIN  08/02/2010    FEW WBC PRESENT, PREDOMINANTLY PMN NO SQUAMOUS EPITHELIAL CELLS SEEN RARE GRAM NEGATIVE RODS   CULT NO ACID FAST BACILLI ISOLATED IN 6 WEEKS 08/03/2010     Lab Results  Component Value Date   ALBUMIN 4.0 03/12/2014   ALBUMIN 3.0 (L) 07/04/2012   ALBUMIN 1.7 (L) 08/02/2010   PREALBUMIN 4.3 (L) 08/02/2010    Lab Results  Component Value Date   MG 2.4 07/04/2012   MG 2.2 08/01/2010   No results found for: VD25OH  Lab Results  Component Value Date   PREALBUMIN 4.3 (L) 08/02/2010   CBC EXTENDED Latest Ref Rng & Units 05/30/2020 01/02/2018 01/01/2018  WBC 4.0 - 10.5 K/uL 9.0 12.4(H) 15.1(H)  RBC 4.22 - 5.81 MIL/uL 4.67 4.16(L) 4.09(L)  HGB 13.0 - 17.0 g/dL 13.6 12.5(L) 12.4(L)  HCT 39 - 52 % 43.3 38.4(L) 37.6(L)  PLT 150 - 400 K/uL 445(H) 223 232  NEUTROABS 1 - 7 x10E3/uL - - -  LYMPHSABS 0 - 3 x10E3/uL - - -     Body mass index is  40.54 kg/m.  Orders:  No orders of the defined types were placed in this encounter.  No orders of the defined types were placed in this encounter.    Procedures: No procedures performed  Clinical Data: No additional findings.  ROS:  All other systems negative, except as noted in the HPI. Review of Systems  Objective: Vital Signs: Ht 6\' 8"  (2.032 m)   Wt (!) 369 lb (167.4 kg)   BMI 40.54 kg/m   Specialty Comments:  No specialty comments available.  PMFS History: Patient Active Problem List   Diagnosis Date Noted  . Abscess of left foot 05/30/2020  . Subacute osteomyelitis, left ankle and foot ([place])   . Skin ulcers of both feet ([place]) 10/24/2017  . Colovesical fistula   . Benign neoplasm of cecum   . Chronic venous insufficiency 07/20/2017  . Chronic left-sided low back pain without  sciatica 11/08/2016  . Toe ulcer ([place]) 03/08/2016  . Verruca 12/28/2015  . Pre-ulcerative calluses 12/28/2015  . History of adenomatous polyp of colon 06/09/2015  . Lipoma 03/13/2014  . Plantar warts 07/09/2012  . Cellulitis 07/04/2012  . Neuropathy 07/04/2012  . Hyperglycemia 07/04/2012  . HYPERTENSION, BENIGN 10/20/2010  . EDEMA 08/20/2010  . WISDOM TEETH EXTRACTION, HX OF 08/20/2010  . HYPOALBUMINEMIA 08/19/2010  . ANEMIA 08/19/2010  . NICOTINE ADDICTION 08/19/2010  . NECROTIZING PNEUMONIA 08/19/2010  . OSTEOARTHRITIS 08/19/2010   Past Medical History:  Diagnosis Date  . Arthritis   . Chronic kidney disease    mass r kidney - partial nephrectomy  . COPD (chronic obstructive pulmonary disease) ([place])   . Diverticulitis   . History of kidney stones   . Hx of adenomatous colonic polyps 06/09/2015  . Hyperlipidemia   . Hypertension    has previously been on medication but then taken off - 05/29/20 pt states he's never been treated for HTN  . Neuromuscular disorder (HCC)    neuropathy lower legs and feet  . Neuropathy    in both feet  . Pneumonia 2011    Family History  Problem Relation Age of Onset  . Heart disease Mother   . Esophageal cancer Mother   . Diabetes Father   . Pancreatic cancer Father   . Breast cancer Sister   . Colon cancer Neg Hx   . Rectal cancer Neg Hx   . Stomach cancer Neg Hx   . Colon polyps Neg Hx     Past Surgical History:  Procedure Laterality Date  . AMPUTATION Bilateral 06/05/2013   Procedure: RIGHT GREAT TOE AMPUTATION/LEFT GREAT TOE DEBRIDEMENT;  Surgeon: Wylene Simmer, MD;  Location: [facility];  Service: Orthopedics;  Laterality: Bilateral;  . AMPUTATION Left 05/30/2020   Procedure: LEFT TRANSMETATARSAL AMPUTATION;  Surgeon: Newt Minion, MD;  Location: [facility];  Service: Orthopedics;  Laterality: Left;  . APPLICATION OF WOUND VAC Left 05/30/2020   Procedure: APPLICATION OF WOUND VAC;  Surgeon: Newt Minion, MD;  Location: [facility];  Service:  Orthopedics;  Laterality: Left;  . COLON SURGERY     robotic partial colectomy Dr. Marcello Moores 12-30-17  . COLONOSCOPY     x 2 last date 09/21/2017 bladder attached to colon eval  . COLONOSCOPY WITH PROPOFOL N/A 09/21/2017   Procedure: COLONOSCOPY WITH PROPOFOL;  Surgeon: [name], MD;  [facility];  Service: Endoscopy;  Laterality: N/A;  . MOUTH SURGERY    . PARTIAL NEPHRECTOMY     right Dr. Leighton Ruff 01-02-25  . ROBOTIC ASSITED PARTIAL NEPHRECTOMY  Right 12/30/2017   Procedure: XI ROBOTIC ASSITED RIGHT PARTIAL NEPHRECTOMY;  Surgeon: Alexis Frock, MD;  [facility];  Service: Urology;  Laterality: Right;  . TOE AMPUTATION Right 06/05/2013   RIGHT GREAT TOE PARTIAL AMPUTATION    . TOOTH EXTRACTION  2011   multiple teeth extracted   Social History   Occupational History  . Occupation: retired  Tobacco Use  . Smoking status: Current Some Day Smoker    Packs/day: 0.10    Years: 20.00    Pack years: 2.00    Types: Cigarettes    Start date: 11/29/1973  . Smokeless tobacco: Never Used  . Tobacco comment: occasional smoker - ~1 pack/week. smoked ~1.5ppd until 5 yr ago  Vaping Use  . Vaping Use: Never used  Substance and Sexual Activity  . Alcohol use: No    Alcohol/week: 0.0 standard drinks  . Drug use: No  . Sexual activity: Not Currently    Partners: Female

## 2020-06-12 ENCOUNTER — Ambulatory Visit: Payer: Medicare PPO | Admitting: Orthopedic Surgery

## 2020-06-12 DIAGNOSIS — K5792 Diverticulitis of intestine, part unspecified, without perforation or abscess without bleeding: Secondary | ICD-10-CM | POA: Diagnosis not present

## 2020-06-12 DIAGNOSIS — Z4801 Encounter for change or removal of surgical wound dressing: Secondary | ICD-10-CM | POA: Diagnosis not present

## 2020-06-12 DIAGNOSIS — G629 Polyneuropathy, unspecified: Secondary | ICD-10-CM | POA: Diagnosis not present

## 2020-06-12 DIAGNOSIS — Z4781 Encounter for orthopedic aftercare following surgical amputation: Secondary | ICD-10-CM | POA: Diagnosis not present

## 2020-06-12 DIAGNOSIS — M86672 Other chronic osteomyelitis, left ankle and foot: Secondary | ICD-10-CM | POA: Diagnosis not present

## 2020-06-12 DIAGNOSIS — I129 Hypertensive chronic kidney disease with stage 1 through stage 4 chronic kidney disease, or unspecified chronic kidney disease: Secondary | ICD-10-CM | POA: Diagnosis not present

## 2020-06-12 DIAGNOSIS — J449 Chronic obstructive pulmonary disease, unspecified: Secondary | ICD-10-CM | POA: Diagnosis not present

## 2020-06-12 DIAGNOSIS — L97526 Non-pressure chronic ulcer of other part of left foot with bone involvement without evidence of necrosis: Secondary | ICD-10-CM | POA: Diagnosis not present

## 2020-06-12 DIAGNOSIS — N189 Chronic kidney disease, unspecified: Secondary | ICD-10-CM | POA: Diagnosis not present

## 2020-06-13 DIAGNOSIS — Z4781 Encounter for orthopedic aftercare following surgical amputation: Secondary | ICD-10-CM | POA: Diagnosis not present

## 2020-06-13 DIAGNOSIS — G629 Polyneuropathy, unspecified: Secondary | ICD-10-CM | POA: Diagnosis not present

## 2020-06-13 DIAGNOSIS — L97526 Non-pressure chronic ulcer of other part of left foot with bone involvement without evidence of necrosis: Secondary | ICD-10-CM | POA: Diagnosis not present

## 2020-06-13 DIAGNOSIS — Z4801 Encounter for change or removal of surgical wound dressing: Secondary | ICD-10-CM | POA: Diagnosis not present

## 2020-06-13 DIAGNOSIS — I129 Hypertensive chronic kidney disease with stage 1 through stage 4 chronic kidney disease, or unspecified chronic kidney disease: Secondary | ICD-10-CM | POA: Diagnosis not present

## 2020-06-13 DIAGNOSIS — J449 Chronic obstructive pulmonary disease, unspecified: Secondary | ICD-10-CM | POA: Diagnosis not present

## 2020-06-13 DIAGNOSIS — M86672 Other chronic osteomyelitis, left ankle and foot: Secondary | ICD-10-CM | POA: Diagnosis not present

## 2020-06-13 DIAGNOSIS — K5792 Diverticulitis of intestine, part unspecified, without perforation or abscess without bleeding: Secondary | ICD-10-CM | POA: Diagnosis not present

## 2020-06-13 DIAGNOSIS — N189 Chronic kidney disease, unspecified: Secondary | ICD-10-CM | POA: Diagnosis not present

## 2020-06-16 ENCOUNTER — Encounter: Payer: Self-pay | Admitting: Orthopedic Surgery

## 2020-06-16 ENCOUNTER — Other Ambulatory Visit: Payer: Self-pay

## 2020-06-16 ENCOUNTER — Ambulatory Visit (INDEPENDENT_AMBULATORY_CARE_PROVIDER_SITE_OTHER): Payer: Medicare PPO | Admitting: Physician Assistant

## 2020-06-16 ENCOUNTER — Telehealth: Payer: Self-pay

## 2020-06-16 VITALS — Ht >= 80 in | Wt 369.0 lb

## 2020-06-16 DIAGNOSIS — N189 Chronic kidney disease, unspecified: Secondary | ICD-10-CM | POA: Diagnosis not present

## 2020-06-16 DIAGNOSIS — M86672 Other chronic osteomyelitis, left ankle and foot: Secondary | ICD-10-CM | POA: Diagnosis not present

## 2020-06-16 DIAGNOSIS — J449 Chronic obstructive pulmonary disease, unspecified: Secondary | ICD-10-CM | POA: Diagnosis not present

## 2020-06-16 DIAGNOSIS — Z4801 Encounter for change or removal of surgical wound dressing: Secondary | ICD-10-CM | POA: Diagnosis not present

## 2020-06-16 DIAGNOSIS — Z4781 Encounter for orthopedic aftercare following surgical amputation: Secondary | ICD-10-CM | POA: Diagnosis not present

## 2020-06-16 DIAGNOSIS — I129 Hypertensive chronic kidney disease with stage 1 through stage 4 chronic kidney disease, or unspecified chronic kidney disease: Secondary | ICD-10-CM | POA: Diagnosis not present

## 2020-06-16 DIAGNOSIS — L97526 Non-pressure chronic ulcer of other part of left foot with bone involvement without evidence of necrosis: Secondary | ICD-10-CM | POA: Diagnosis not present

## 2020-06-16 DIAGNOSIS — G629 Polyneuropathy, unspecified: Secondary | ICD-10-CM | POA: Diagnosis not present

## 2020-06-16 DIAGNOSIS — M86272 Subacute osteomyelitis, left ankle and foot: Secondary | ICD-10-CM

## 2020-06-16 DIAGNOSIS — K5792 Diverticulitis of intestine, part unspecified, without perforation or abscess without bleeding: Secondary | ICD-10-CM | POA: Diagnosis not present

## 2020-06-16 MED ORDER — PENTOXIFYLLINE ER 400 MG PO TBCR: 400.0000 mg | EXTENDED_RELEASE_TABLET | Freq: Three times a day (TID) | ORAL | 3 refills | Status: DC

## 2020-06-16 MED ORDER — NITROGLYCERIN 0.2 MG/HR TD PT24
0.2000 mg | MEDICATED_PATCH | Freq: Every day | TRANSDERMAL | 4 refills | Status: DC
Start: 2020-06-16 — End: 2020-10-09

## 2020-06-16 MED ORDER — OXYCODONE-ACETAMINOPHEN 5-325 MG PO TABS: 1.0000 | ORAL_TABLET | ORAL | 0 refills | Status: DC | PRN

## 2020-06-16 NOTE — Telephone Encounter (Signed)
Called and sw HHN to advise that the pt was seen in the office today and has been started on trental tid, nitro patch and refilled his pain medication. Advised to stop smoking and follow up in one week. Will call with any other questions.

## 2020-06-16 NOTE — Telephone Encounter (Signed)
Tried calling patient concerning his left foot, but no answer and no VM.  Per [facility], patient's left foot has opened up more and the sutures are not holding it together, and left foot has a mild odor.  Patient does have an appointment scheduled for Friday, 06/20/2020.  Would you like for patient to be seen sooner?  Please advise.  Thank you.  CB# [id number].

## 2020-06-16 NOTE — Telephone Encounter (Signed)
I called and sw the pt and he will come in today at 2:30 will call the [facility] after the appt to give instructions

## 2020-06-16 NOTE — Progress Notes (Signed)
Office Visit Note   Patient: [name].           Date of Birth: August 14, 1956           MRN: [id number] Visit Date: 06/16/2020              Requested by: [name], MD [address] [place],  [place] [address] PCP: [name], MD  Chief Complaint  Patient presents with  . Left Foot - Routine Post Op    7/2/221 left transmet amputation       HPI: Lelon Frohlich is a 64 year old gentleman who is 3 weeks status post left transmetatarsal amputation.  His home health nurse called because she was concerned that there was some wound dehiscence and a bit of an odor.  Patient denies any increasing pain.  He does say the swelling is better when he has it elevated and earlier in the day.  He is currently taking Bactrim.  Assessment & Plan: Visit Diagnoses: No diagnosis found.  Plan: I have given him a prescription for nitroglycerin And instructed him how to apply this.  Also gave him a prescription for Trental.  Gave him a refill of his pain medication.  Once again discussed the importance of tobacco cessation in relationship to healing  Ortho Exam  Patient is alert, oriented, no adenopathy, well-dressed, normal affect, normal respiratory effort. Patient has a mild to moderate soft tissue swelling but no areas of necrosis no areas of fluctuance.  He does have about a 1 cm wound dehiscence throughout the wound.  I probed along the suture line this does not probe deep in any area.  I could not appreciate any foul odor.  He has a bounding dorsalis pedis pulse no evidence of infection  Imaging: No results found. No images are attached to the encounter.  Labs: Lab Results  Component Value Date   HGBA1C 6.0 (H) 08/23/2017   HGBA1C 5.3 03/12/2014   HGBA1C 5.4 07/04/2012   ESRSEDRATE 14 06/14/2017   ESRSEDRATE 30 05/17/2017   ESRSEDRATE 23 (H) 01/31/2015   CRP 23.4 (H) 06/14/2017   CRP 37.4 (H) 05/17/2017   CRP 55.8 (H) 03/29/2017   REPTSTATUS 09/17/2010 FINAL 08/03/2010    GRAMSTAIN  08/02/2010    FEW WBC PRESENT, PREDOMINANTLY PMN NO SQUAMOUS EPITHELIAL CELLS SEEN RARE GRAM NEGATIVE RODS   CULT NO ACID FAST BACILLI ISOLATED IN 6 WEEKS 08/03/2010     Lab Results  Component Value Date   ALBUMIN 4.0 03/12/2014   ALBUMIN 3.0 (L) 07/04/2012   ALBUMIN 1.7 (L) 08/02/2010   PREALBUMIN 4.3 (L) 08/02/2010    Lab Results  Component Value Date   MG 2.4 07/04/2012   MG 2.2 08/01/2010   No results found for: VD25OH  Lab Results  Component Value Date   PREALBUMIN 4.3 (L) 08/02/2010   CBC EXTENDED Latest Ref Rng & Units 05/30/2020 01/02/2018 01/01/2018  WBC 4.0 - 10.5 K/uL 9.0 12.4(H) 15.1(H)  RBC 4.22 - 5.81 MIL/uL 4.67 4.16(L) 4.09(L)  HGB 13.0 - 17.0 g/dL 13.6 12.5(L) 12.4(L)  HCT 39 - 52 % 43.3 38.4(L) 37.6(L)  PLT 150 - 400 K/uL 445(H) 223 232  NEUTROABS 1 - 7 x10E3/uL - - -  LYMPHSABS 0 - 3 x10E3/uL - - -     Body mass index is 40.54 kg/m.  Orders:  No orders of the defined types were placed in this encounter.  Meds ordered this encounter  Medications  . pentoxifylline (TRENTAL) 400 MG CR tablet  Sig: Take 1 tablet (400 mg total) by mouth 3 (three) times daily with meals.    Dispense:  90 tablet    Refill:  3  . nitroGLYCERIN (NITRODUR - DOSED IN MG/24 HR) 0.2 mg/hr patch    Sig: Place 1 patch (0.2 mg total) onto the skin daily.    Dispense:  30 patch    Refill:  4  . oxyCODONE-acetaminophen (PERCOCET/ROXICET) 5-325 MG tablet    Sig: Take 1 tablet by mouth every 4 (four) hours as needed for severe pain.    Dispense:  30 tablet    Refill:  0     Procedures: No procedures performed  Clinical Data: No additional findings.  ROS:  All other systems negative, except as noted in the HPI. Review of Systems  Objective: Vital Signs: Ht 6\' 8"  (2.032 m)   Wt (!) 369 lb (167.4 kg)   BMI 40.54 kg/m   Specialty Comments:  No specialty comments available.  PMFS History: Patient Active Problem List   Diagnosis Date Noted  . Abscess  of left foot 05/30/2020  . Subacute osteomyelitis, left ankle and foot ([place])   . Skin ulcers of both feet ([place]) 10/24/2017  . Colovesical fistula   . Benign neoplasm of cecum   . Chronic venous insufficiency 07/20/2017  . Chronic left-sided low back pain without sciatica 11/08/2016  . Toe ulcer ([place]) 03/08/2016  . Verruca 12/28/2015  . Pre-ulcerative calluses 12/28/2015  . History of adenomatous polyp of colon 06/09/2015  . Lipoma 03/13/2014  . Plantar warts 07/09/2012  . Cellulitis 07/04/2012  . Neuropathy 07/04/2012  . Hyperglycemia 07/04/2012  . HYPERTENSION, BENIGN 10/20/2010  . EDEMA 08/20/2010  . WISDOM TEETH EXTRACTION, HX OF 08/20/2010  . HYPOALBUMINEMIA 08/19/2010  . ANEMIA 08/19/2010  . NICOTINE ADDICTION 08/19/2010  . NECROTIZING PNEUMONIA 08/19/2010  . OSTEOARTHRITIS 08/19/2010   Past Medical History:  Diagnosis Date  . Arthritis   . Chronic kidney disease    mass r kidney - partial nephrectomy  . COPD (chronic obstructive pulmonary disease) ([place])   . Diverticulitis   . History of kidney stones   . Hx of adenomatous colonic polyps 06/09/2015  . Hyperlipidemia   . Hypertension    has previously been on medication but then taken off - 05/29/20 pt states he's never been treated for HTN  . Neuromuscular disorder (HCC)    neuropathy lower legs and feet  . Neuropathy    in both feet  . Pneumonia 2011    Family History  Problem Relation Age of Onset  . Heart disease Mother   . Esophageal cancer Mother   . Diabetes Father   . Pancreatic cancer Father   . Breast cancer Sister   . Colon cancer Neg Hx   . Rectal cancer Neg Hx   . Stomach cancer Neg Hx   . Colon polyps Neg Hx     Past Surgical History:  Procedure Laterality Date  . AMPUTATION Bilateral 06/05/2013   Procedure: RIGHT GREAT TOE AMPUTATION/LEFT GREAT TOE DEBRIDEMENT;  Surgeon: Wylene Simmer, MD;  Location: [facility];  Service: Orthopedics;  Laterality: Bilateral;  . AMPUTATION Left 05/30/2020   Procedure:  LEFT TRANSMETATARSAL AMPUTATION;  Surgeon: Newt Minion, MD;  Location: [facility];  Service: Orthopedics;  Laterality: Left;  . APPLICATION OF WOUND VAC Left 05/30/2020   Procedure: APPLICATION OF WOUND VAC;  Surgeon: Newt Minion, MD;  Location: [facility];  Service: Orthopedics;  Laterality: Left;  . COLON SURGERY  robotic partial colectomy Dr. Marcello Moores 12-30-17  . COLONOSCOPY     x 2 last date 09/21/2017 bladder attached to colon eval  . COLONOSCOPY WITH PROPOFOL N/A 09/21/2017   Procedure: COLONOSCOPY WITH PROPOFOL;  Surgeon: [name], MD;  [facility];  Service: Endoscopy;  Laterality: N/A;  . MOUTH SURGERY    . PARTIAL NEPHRECTOMY     right Dr. Leighton Ruff 03-03-02  . ROBOTIC ASSITED PARTIAL NEPHRECTOMY Right 12/30/2017   Procedure: XI ROBOTIC ASSITED RIGHT PARTIAL NEPHRECTOMY;  Surgeon: Alexis Frock, MD;  [facility];  Service: Urology;  Laterality: Right;  . TOE AMPUTATION Right 06/05/2013   RIGHT GREAT TOE PARTIAL AMPUTATION    . TOOTH EXTRACTION  2011   multiple teeth extracted   Social History   Occupational History  . Occupation: retired  Tobacco Use  . Smoking status: Current Some Day Smoker    Packs/day: 0.10    Years: 20.00    Pack years: 2.00    Types: Cigarettes    Start date: 11/29/1973  . Smokeless tobacco: Never Used  . Tobacco comment: occasional smoker - ~1 pack/week. smoked ~1.5ppd until 5 yr ago  Vaping Use  . Vaping Use: Never used  Substance and Sexual Activity  . Alcohol use: No    Alcohol/week: 0.0 standard drinks  . Drug use: No  . Sexual activity: Not Currently    Partners: Female

## 2020-06-17 ENCOUNTER — Encounter (HOSPITAL_BASED_OUTPATIENT_CLINIC_OR_DEPARTMENT_OTHER): Payer: Medicare PPO | Attending: Internal Medicine | Admitting: Internal Medicine

## 2020-06-17 DIAGNOSIS — L97512 Non-pressure chronic ulcer of other part of right foot with fat layer exposed: Secondary | ICD-10-CM | POA: Diagnosis not present

## 2020-06-17 DIAGNOSIS — I10 Essential (primary) hypertension: Secondary | ICD-10-CM | POA: Insufficient documentation

## 2020-06-17 DIAGNOSIS — Z6838 Body mass index (BMI) 38.0-38.9, adult: Secondary | ICD-10-CM | POA: Insufficient documentation

## 2020-06-17 DIAGNOSIS — G9009 Other idiopathic peripheral autonomic neuropathy: Secondary | ICD-10-CM | POA: Diagnosis not present

## 2020-06-17 DIAGNOSIS — Z89411 Acquired absence of right great toe: Secondary | ICD-10-CM | POA: Insufficient documentation

## 2020-06-17 DIAGNOSIS — M1990 Unspecified osteoarthritis, unspecified site: Secondary | ICD-10-CM | POA: Diagnosis not present

## 2020-06-17 DIAGNOSIS — Z87891 Personal history of nicotine dependence: Secondary | ICD-10-CM | POA: Diagnosis not present

## 2020-06-17 DIAGNOSIS — G629 Polyneuropathy, unspecified: Secondary | ICD-10-CM | POA: Insufficient documentation

## 2020-06-17 DIAGNOSIS — J449 Chronic obstructive pulmonary disease, unspecified: Secondary | ICD-10-CM | POA: Insufficient documentation

## 2020-06-17 DIAGNOSIS — L97518 Non-pressure chronic ulcer of other part of right foot with other specified severity: Secondary | ICD-10-CM | POA: Diagnosis not present

## 2020-06-17 NOTE — Progress Notes (Signed)
SPENSER, HARREN ([id number]) Visit Report for 06/17/2020 Abuse/Suicide Risk Screen Details Patient Name: Date of Service: [name], [name] 06/17/2020 10:30 A M Medical Record Number: [id number] Patient Account Number: [id number] Date of Birth/Sex: Treating RN: 01-25-1956 (64 y.o. [name] Primary Care Jadiel Schmieder: [name] Other Clinician: Referring Favor Hackler: Treating Saree Krogh/Extender: [name] in Treatment: 0 Abuse/Suicide Risk Screen Items Answer ABUSE RISK SCREEN: Has anyone close to you tried to hurt or harm you recentlyo No Do you feel uncomfortable with anyone in your familyo No Has anyone forced you do things that you didnt want to doo No Electronic Signature(s) Signed: 06/17/2020 6:10:08 PM By: [name] RN, BSN Entered By: [name] on 06/17/2020 10:51:42 -------------------------------------------------------------------------------- Activities of Daily Living Details Patient Name: Date of Service: [name], [name] 06/17/2020 10:30 A M Medical Record Number: [id number] Patient Account Number: [id number] Date of Birth/Sex: Treating RN: 1956/04/02 (64 y.o. [name] Primary Care Kori Colin: [name] Other Clinician: Referring Charnelle Bergeman: Treating Micki Cassel/Extender: [name] in Treatment: 0 Activities of Daily Living Items Answer Activities of Daily Living (Please select one for each item) Drive Automobile Completely Able T Medications ake Completely Able Use T elephone Completely Able Care for Appearance Completely Able Use T oilet Completely Able Bath / Shower Completely Able Dress Self Completely Able Feed Self Completely Able Walk Completely Able Get In / Out Bed Completely Able Housework Completely Able Prepare Meals Completely Able Handle Money Completely Able Shop for Self Completely Able Electronic Signature(s) Signed: 06/17/2020 6:10:08 PM By: [name] RN, BSN Entered By: [name] on 06/17/2020 10:52:03 -------------------------------------------------------------------------------- Education Screening Details Patient Name: Date of Service: [name], [name] 06/17/2020 10:30 A M Medical Record Number: [id number] Patient Account Number: [id number] Date of Birth/Sex: Treating RN: [birth year] (64 y.o. [name] Primary Care Druscilla Petsch: [name] Other Clinician: Referring Nerine Pulse: Treating Kade Rickels/Extender: [name] in Treatment: 0 Primary Learner Assessed: Patient Learning Preferences/Education Level/Primary Language Learning Preference: Explanation, Demonstration, Printed Material Highest Education Level: College or Above Preferred Language: English Cognitive Barrier Language Barrier: No Translator Needed: No Memory Deficit: No Emotional Barrier: No Cultural/Religious Beliefs Affecting Medical Care: No Physical Barrier Impaired Vision: Yes Glasses Impaired Hearing: No Decreased Hand dexterity: No Knowledge/Comprehension Knowledge Level: High Comprehension Level: High Ability to understand written instructions: High Ability to understand verbal instructions: High Motivation Anxiety Level: Calm Cooperation: Cooperative Education Importance: Acknowledges Need Interest in Health Problems: Asks Questions Perception: Coherent Willingness to Engage in Self-Management High Activities: Readiness to Engage in Self-Management High Activities: Electronic Signature(s) Signed: 06/17/2020 6:10:08 PM By: [name] RN, BSN Entered By: [name] on 06/17/2020 10:52:42 -------------------------------------------------------------------------------- Fall Risk Assessment Details Patient Name: Date of Service: [name], [name] 06/17/2020 10:30 A M Medical Record Number: [id number] Patient Account Number: [id number] Date of Birth/Sex: Treating RN: [birth year] (64  y.o. [name] Primary Care Mohmmad Saleeby: [name] Other Clinician: Referring Cleatis Fandrich: Treating Montay Vanvoorhis/Extender: [name] in Treatment: 0 Fall Risk Assessment Items Have you had 2 or more falls in the last 12 monthso 0 No Have you had any fall that resulted in injury in the last 12 monthso 0 Yes FALLS RISK SCREEN History of falling - immediate or within 3 months 0 No Secondary diagnosis (Do you have 2 or more medical diagnoseso) 0 No Ambulatory aid None/bed rest/wheelchair/nurse 0 No Crutches/cane/walker 15 Yes Furniture 0 No Intravenous therapy Access/Saline/Heparin  Lock 0 No Gait/Transferring Normal/ bed rest/ wheelchair 0 Yes Weak (short steps with or without shuffle, stooped but able to lift head while walking, may seek 0 No support from furniture) Impaired (short steps with shuffle, may have difficulty arising from chair, head down, impaired 0 No balance) Mental Status Oriented to own ability 0 Yes Electronic Signature(s) Signed: 06/17/2020 6:10:08 PM By: [name] RN, BSN Entered By: [name] on 06/17/2020 10:53:36 -------------------------------------------------------------------------------- Foot Assessment Details Patient Name: Date of Service: [name], [name] 06/17/2020 10:30 A M Medical Record Number: [id number] Patient Account Number: [id number] Date of Birth/Sex: Treating RN: 08/17/1956 (64 y.o. [name] Primary Care Davie Claud: [name] Other Clinician: Referring Roylene Heaton: Treating Jermone Geister/Extender: [name] in Treatment: 0 Foot Assessment Items Site Locations + = Sensation present, - = Sensation absent, C = Callus, U = Ulcer R = Redness, W = Warmth, M = Maceration, PU = Pre-ulcerative lesion F = Fissure, S = Swelling, D = Dryness Assessment Right: Left: Other Deformity: No No Prior Foot Ulcer: No No Prior Amputation: Yes Yes Charcot Joint: No  No Ambulatory Status: Ambulatory With Help Assistance Device: Cane Gait: Steady Electronic Signature(s) Signed: 06/17/2020 6:10:08 PM By: [name] RN, BSN Entered By: [name] on 06/17/2020 10:54:44 -------------------------------------------------------------------------------- Nutrition Risk Screening Details Patient Name: Date of Service: [name], [name] 06/17/2020 10:30 A M Medical Record Number: [id number] Patient Account Number: [id number] Date of Birth/Sex: Treating RN: August 31, 1956 (64 y.o. [name] Primary Care Davarious Tumbleson: [name] Other Clinician: Referring Haelie Clapp: Treating Molleigh Huot/Extender: [name] in Treatment: 0 Height (in): 80 Weight (lbs): 350 Body Mass Index (BMI): 38.4 Nutrition Risk Screening Items Score Screening NUTRITION RISK SCREEN: I have an illness or condition that made me change the kind and/or amount of food I eat 0 No I eat fewer than two meals per day 3 Yes I eat few fruits and vegetables, or milk products 0 No I have three or more drinks of beer, liquor or wine almost every day 0 No I have tooth or mouth problems that make it hard for me to eat 0 No I don't always have enough money to buy the food I need 0 No I eat alone most of the time 1 Yes I take three or more different prescribed or over-the-counter drugs a day 1 Yes Without wanting to, I have lost or gained 10 pounds in the last six months 0 No I am not always physically able to shop, cook and/or feed myself 0 No Nutrition Protocols Good Risk Protocol Provide education on elevated blood Moderate Risk Protocol 0 sugars and impact on wound healing, as applicable High Risk Proctocol Risk Level: Moderate Risk Score: 5 Electronic Signature(s) Signed: 06/17/2020 6:10:08 PM By: [name] RN, BSN Entered By: [name] on 06/17/2020 10:54:02

## 2020-06-18 DIAGNOSIS — G629 Polyneuropathy, unspecified: Secondary | ICD-10-CM | POA: Diagnosis not present

## 2020-06-18 DIAGNOSIS — Z4801 Encounter for change or removal of surgical wound dressing: Secondary | ICD-10-CM | POA: Diagnosis not present

## 2020-06-18 DIAGNOSIS — Z4781 Encounter for orthopedic aftercare following surgical amputation: Secondary | ICD-10-CM | POA: Diagnosis not present

## 2020-06-18 DIAGNOSIS — M86672 Other chronic osteomyelitis, left ankle and foot: Secondary | ICD-10-CM | POA: Diagnosis not present

## 2020-06-18 DIAGNOSIS — K5792 Diverticulitis of intestine, part unspecified, without perforation or abscess without bleeding: Secondary | ICD-10-CM | POA: Diagnosis not present

## 2020-06-18 DIAGNOSIS — J449 Chronic obstructive pulmonary disease, unspecified: Secondary | ICD-10-CM | POA: Diagnosis not present

## 2020-06-18 DIAGNOSIS — N189 Chronic kidney disease, unspecified: Secondary | ICD-10-CM | POA: Diagnosis not present

## 2020-06-18 DIAGNOSIS — L97526 Non-pressure chronic ulcer of other part of left foot with bone involvement without evidence of necrosis: Secondary | ICD-10-CM | POA: Diagnosis not present

## 2020-06-18 DIAGNOSIS — I129 Hypertensive chronic kidney disease with stage 1 through stage 4 chronic kidney disease, or unspecified chronic kidney disease: Secondary | ICD-10-CM | POA: Diagnosis not present

## 2020-06-20 ENCOUNTER — Ambulatory Visit: Payer: Medicare PPO | Admitting: Physician Assistant

## 2020-06-20 NOTE — Progress Notes (Signed)
[name], [name] ([id number]) Visit Report for 06/17/2020 Allergy List Details Patient Name: Date of Service: [name], [name] 06/17/2020 10:30 [name] Medical Record Number: [id number] Patient Account Number: [id number] Date of Birth/Sex: Treating RN: [name] (64 y.o. [name] Primary Care [name]: [name] Other Clinician: Referring [name]: Treating [name]/Extender: [name] in Treatment: 0 Allergies Active Allergies No Known Drug Allergies Allergy Notes Electronic Signature(s) Signed: 06/17/2020 6:10:08 PM By: [name] Entered By: Cory Gouty on 06/17/2020 10:47:25 -------------------------------------------------------------------------------- Arrival Information Details Patient Name: Date of Service: [name], [name] 06/17/2020 10:30 [name] Medical Record Number: [id number] Patient Account Number: [id number] Date of Birth/Sex: Treating RN: [name] (64 y.o. [name] Primary Care Maddelyn Rocca: [name] Other Clinician: Referring [name]: Treating Thai Hemrick/Extender: [name] in Treatment: 0 Visit Information Patient Arrived: [name] Arrival Time: 10:37 Accompanied By: self Transfer Assistance: None Patient Identification Verified: Yes Secondary Verification Process Completed: Yes Patient Requires Transmission-Based Precautions: No Patient Has Alerts: No History Since Last Visit Has Dressing in Place as Prescribed: Yes Has Footwear/Offloading in Place as Prescribed: Yes Left: Surgical Shoe with Pressure Relief Insole Right: Surgical Shoe with Pressure Relief Insole Pain Present Now: No Electronic Signature(s) Signed: 06/17/2020 6:10:08 PM By: [name] Entered By: Cory Gouty on 06/17/2020 10:43:25 -------------------------------------------------------------------------------- Clinic Level of Care Assessment Details Patient Name: Date of  Service: [name], [name] 06/17/2020 10:30 [name] Medical Record Number: [id number] Patient Account Number: [id number] Date of Birth/Sex: Treating RN: [name] (64 y.o. [name] Primary Care Hether Anselmo: [name] Other Clinician: Referring Sahmir [name]: Treating Anona Giovannini/Extender: [name] in Treatment: 0 Clinic Level of Care Assessment Items TOOL 1 Quantity Score X- 1 0 Use when EandM and Procedure is performed on INITIAL visit ASSESSMENTS - Nursing Assessment / Reassessment X- 1 20 General Physical Exam (combine w/ comprehensive assessment (listed just below) when performed on new pt. evals) X- 1 25 Comprehensive Assessment (HX, ROS, Risk Assessments, Wounds Hx, etc.) ASSESSMENTS - Wound and Skin Assessment / Reassessment []  - 0 Dermatologic / Skin Assessment (not related to wound area) ASSESSMENTS - Ostomy and/or Continence Assessment and Care []  - 0 Incontinence Assessment and Management []  - 0 Ostomy Care Assessment and Management (repouching, etc.) PROCESS - Coordination of Care X - Simple Patient / Family Education for ongoing care 1 15 []  - 0 Complex (extensive) Patient / Family Education for ongoing care X- 1 10 Staff obtains Programmer, systems, Records, T Results / Process Orders est X- 1 10 Staff telephones HHA, Nursing Homes / Clarify orders / etc []  - 0 Routine Transfer to another Facility (non-emergent condition) []  - 0 Routine Hospital Admission (non-emergent condition) X- 1 15 New Admissions / Biomedical engineer / Ordering NPWT Apligraf, etc. , []  - 0 Emergency Hospital Admission (emergent condition) PROCESS - Special Needs []  - 0 Pediatric / Minor Patient Management []  - 0 Isolation Patient Management []  - 0 Hearing / Language / Visual special needs []  - 0 Assessment of Community assistance (transportation, D/C planning, etc.) []  - 0 Additional assistance / Altered mentation []  - 0 Support Surface(s)  Assessment (bed, cushion, seat, etc.) INTERVENTIONS - Miscellaneous []  - 0 External ear exam []  - 0 Patient Transfer (multiple staff / Civil Service fast streamer / Similar devices) []  - 0 Simple Staple / Suture removal (25 or less) []  - 0 Complex Staple / Suture removal (26 or more) []  - 0  Hypo/Hyperglycemic Management (do not check if billed separately) X- 1 15 Ankle / Brachial Index (ABI) - do not check if billed separately Has the patient been seen at the hospital within the last three years: Yes Total Score: 110 Level Of Care: New/Established - Level 3 Electronic Signature(s) Signed: 06/19/2020 5:02:14 PM By: [name] Entered By: [name] on 06/17/2020 18:06:05 -------------------------------------------------------------------------------- Lower Extremity Assessment Details Patient Name: Date of Service: [name], [name] 06/17/2020 10:30 [name] Medical Record Number: [id number] Patient Account Number: [id number] Date of Birth/Sex: Treating RN: [birth year] (64 y.o. [name] Primary Care Manmeet [name]: [name] Other Clinician: Referring [name]: Treating [name]/Extender: [name] in Treatment: 0 Edema Assessment Assessed: [Left: No] [Right: No] Edema: [Left: Ye] [Right: s] Calf Left: Right: Point of Measurement: cm From Medial Instep cm 45.2 cm Ankle Left: Right: Point of Measurement: cm From Medial Instep cm 27.5 cm Vascular Assessment Pulses: Dorsalis Pedis Palpable: [Right:Yes] Blood Pressure: Brachial: [Right:146] Dorsalis Pedis: 138 Ankle: Posterior Tibial: 130 Ankle Brachial Index: [Right:0.95] Electronic Signature(s) Signed: 06/17/2020 6:10:08 PM By: [name] Entered By: Cory Gouty on 06/17/2020 11:16:27 -------------------------------------------------------------------------------- Multi Wound Chart Details Patient Name: Date of Service: [name], [name] 06/17/2020 10:30 Cory  [name] Medical Record Number: [id number] Patient Account Number: [id number] Date of Birth/Sex: Treating RN: [birth year] (63 y.o. [name]) [name] Primary Care [name]: [name] Other Clinician: Referring [name]: Treating Mihira Tozzi/Extender: [name] in Treatment: 0 Vital Signs Height(in): 65 Pulse(bpm): 20 Weight(lbs): 350 Blood Pressure(mmHg): 146/80 Body Mass Index(BMI): 38 Temperature(F): 98.4 Respiratory Rate(breaths/min): 18 Photos: [12:No Photos Right Metatarsal head first] [N/Cory:N/Cory N/Cory] Wound Location: [12:Gradually Appeared] [N/Cory:N/Cory] Wounding Event: [12:Neuropathic Ulcer-Non Diabetic] [N/Cory:N/Cory] Primary Etiology: [12:Anemia, Chronic Obstructive] [N/Cory:N/Cory] Comorbid History: [12:Pulmonary Disease (COPD), Hypertension, Osteoarthritis, Osteomyelitis, Neuropathy 11/29/2018] [N/Cory:N/Cory] Date Acquired: [12:0] [N/Cory:N/Cory] Weeks of Treatment: [12:Open] [N/Cory:N/Cory] Wound Status: [12:3.6x3.3x0.2] [N/Cory:N/Cory] Measurements L x W x D (cm) [12:9.331] [N/Cory:N/Cory] Cory (cm) : rea [12:1.866] [N/Cory:N/Cory] Volume (cm) : [12:Full Thickness Without Exposed] [N/Cory:N/Cory] Classification: [12:Support Structures Medium] [N/Cory:N/Cory] Exudate Cory mount: [12:Serosanguineous] [N/Cory:N/Cory] Exudate Type: [12:red, brown] [N/Cory:N/Cory] Exudate Color: [12:Thickened] [N/Cory:N/Cory] Wound Margin: [12:Large (67-100%)] [N/Cory:N/Cory] Granulation Cory mount: [12:Red] [N/Cory:N/Cory] Granulation Quality: [12:None Present (0%)] [N/Cory:N/Cory] Necrotic Cory mount: [12:Fat Layer (Subcutaneous Tissue)] [N/Cory:N/Cory] Exposed Structures: [12:Exposed: Yes Fascia: No Tendon: No Muscle: No Joint: No Bone: No Small (1-33%)] [N/Cory:N/Cory] Epithelialization: [12:Debridement - Excisional] [N/Cory:N/Cory] Debridement: Pre-procedure Verification/Time Out 11:48 [N/Cory:N/Cory] Taken: [12:Callus, Subcutaneous] [N/Cory:N/Cory] Tissue Debrided: [12:Skin/Subcutaneous Tissue] [N/Cory:N/Cory] Level: [12:11.88] [N/Cory:N/Cory] Debridement Cory (sq cm): [12:rea Blade, Forceps]  [N/Cory:N/Cory] Instrument: [12:Moderate] [N/Cory:N/Cory] Bleeding: [12:Pressure] [N/Cory:N/Cory] Hemostasis Cory chieved: [12:0] [N/Cory:N/Cory] Procedural Pain: [12:0] [N/Cory:N/Cory] Post Procedural Pain: [12:Procedure was tolerated well] [N/Cory:N/Cory] Debridement Treatment Response: [12:3.6x3.3x0.2] [N/Cory:N/Cory] Post Debridement Measurements L x W x D (cm) [12:1.866] [N/Cory:N/Cory] Post Debridement Volume: (cm) [12:Debridement] [N/Cory:N/Cory] Treatment Notes Electronic Signature(s) Signed: 06/17/2020 6:00:01 PM By: [name] MD Signed: 06/20/2020 5:42:58 PM By: Cory Coria RN Entered By: [name] on 06/17/2020 12:46:43 -------------------------------------------------------------------------------- Multi-Disciplinary Care Plan Details Patient Name: Date of Service: [name], [name] 06/17/2020 10:30 [name] Medical Record Number: [id number] Patient Account Number: [id number] Date of Birth/Sex: Treating RN: [birth year] (64 y.o. [name] Primary Care Cory Riera: [name] Other Clinician: Referring Kaybree Williams: Treating Flemon Kelty/Extender: [name] in Treatment: 0 Active Inactive Abuse / Safety / Falls / Self Care Management Nursing Diagnoses: Potential for falls Potential for injury related to falls Goals: Patient will  not experience any injury related to falls Date Initiated: 06/17/2020 Target Resolution Date: 07/18/2020 Goal Status: Active Patient/caregiver will verbalize/demonstrate measures taken to prevent injury and/or falls Date Initiated: 06/17/2020 Target Resolution Date: 07/18/2020 Goal Status: Active Interventions: Assess Activities of Daily Living upon admission and as needed Assess fall risk on admission and as needed Assess: immobility, friction, shearing, incontinence upon admission and as needed Assess impairment of mobility on admission and as needed per policy Assess personal safety and home safety (as indicated) on admission and as needed Provide  education on fall prevention Provide education on personal and home safety Notes: Wound/Skin Impairment Nursing Diagnoses: Impaired tissue integrity Knowledge deficit related to ulceration/compromised skin integrity Goals: Patient/caregiver will verbalize understanding of skin care regimen Date Initiated: 06/17/2020 Target Resolution Date: 07/18/2020 Goal Status: Active Ulcer/skin breakdown will have Cory volume reduction of 30% by week 4 Date Initiated: 06/17/2020 Target Resolution Date: 07/18/2020 Goal Status: Active Interventions: Assess patient/caregiver ability to obtain necessary supplies Assess patient/caregiver ability to perform ulcer/skin care regimen upon admission and as needed Assess ulceration(s) every visit Provide education on ulcer and skin care Notes: Electronic Signature(s) Signed: 06/19/2020 5:02:14 PM By: [name] Entered By: [name] on 06/17/2020 18:05:15 -------------------------------------------------------------------------------- Pain Assessment Details Patient Name: Date of Service: [name], [name] 06/17/2020 10:30 [name] Medical Record Number: [id number] Patient Account Number: [id number] Date of Birth/Sex: Treating RN: [birth year] [name] (64 y.o. [name] Primary Care Demetrick Eichenberger: [name] Other Clinician: Referring Jewel Venditto: Treating Koron Godeaux/Extender: [name] in Treatment: 0 Active Problems Location of Pain Severity and Description of Pain Patient Has Paino No Site Locations Rate the pain. Rate the pain. Current Pain Level: 0 Pain Management and Medication Current Pain Management: Electronic Signature(s) Signed: 06/17/2020 6:10:08 PM By: [name] Entered By: Cory Gouty on 06/17/2020 11:02:58 -------------------------------------------------------------------------------- Patient/Caregiver Education Details Patient Name: Date of Service: Regino Schultze [name], [name]  7/20/2021andnbsp10:30 [name] Medical Record Number: [id number] Patient Account Number: [id number] Date of Birth/Gender: Treating RN: [birth year] [name] (64 y.o. [name] Primary Care Physician: [name] Other Clinician: Referring Physician: Treating Physician/Extender: [name] in Treatment: 0 Education Assessment Education Provided To: Patient Education Topics Provided Wound/Skin Impairment: Methods: Explain/Verbal Responses: State content correctly Electronic Signature(s) Signed: 06/19/2020 5:02:14 PM By: [name] Entered By: [name] on 06/17/2020 18:05:23 -------------------------------------------------------------------------------- Wound Assessment Details Patient Name: Date of Service: [name], [name] 06/17/2020 10:30 [name] Medical Record Number: [id number] Patient Account Number: [id number] Date of Birth/Sex: Treating RN: [birth year] (64 y.o. [name] Primary Care Meryle Pugmire: [name] Other Clinician: Referring Cottrell Gentles: Treating Hollister Wessler/Extender: [name] in Treatment: 0 Wound Status Wound Number: 12 Primary Neuropathic Ulcer-Non Diabetic Etiology: Wound Location: Right Metatarsal head first Wound Open Wounding Event: Gradually Appeared Status: Date Acquired: 11/29/2018 Comorbid Anemia, Chronic Obstructive Pulmonary Disease (COPD), Weeks Of Treatment: 0 History: Hypertension, Osteoarthritis, Osteomyelitis, Neuropathy Clustered Wound: No Photos Photo Uploaded By: Mikeal Hawthorne on 06/17/2020 16:12:08 Wound Measurements Length: (cm) 3.6 Width: (cm) 3.3 Depth: (cm) 0.2 Area: (cm) 9.331 Volume: (cm) 1.866 % Reduction in Area: % Reduction in Volume: Epithelialization: Small (1-33%) Tunneling: No Undermining: No Wound Description Classification: Full Thickness Without Exposed Support Structures Wound Margin: Thickened Exudate Amount: Medium Exudate Type:  Serosanguineous Exudate Color: red, brown Foul Odor After Cleansing: No Slough/Fibrino No Wound Bed Granulation Amount: Large (67-100%) Exposed Structure Granulation Quality: Red Fascia Exposed: No Necrotic Amount: None  Present (0%) Fat Layer (Subcutaneous Tissue) Exposed: Yes Tendon Exposed: No Muscle Exposed: No Joint Exposed: No Bone Exposed: No Electronic Signature(s) Signed: 06/17/2020 6:10:08 PM By: [name] Entered By: Cory Gouty on 06/17/2020 11:02:19 -------------------------------------------------------------------------------- Experiment Details Patient Name: Date of Service: [name], [name] 06/17/2020 10:30 [name] Medical Record Number: [id number] Patient Account Number: [id number] Date of Birth/Sex: Treating RN: [birth year][name] (64 y.o. [name] Primary Care Aneyah Lortz: [name] Other Clinician: Referring Leiland Niese: Treating Vianna Venezia/Extender: [name] in Treatment: 0 Vital Signs Time Taken: 10:44 Temperature (F): 98.4 Height (in): 80 Pulse (bpm): 86 Source: Stated Respiratory Rate (breaths/min): 18 Weight (lbs): 350 Blood Pressure (mmHg): 146/80 Source: Stated Reference Range: 80 - 120 mg / dl Body Mass Index (BMI): 38.4 Electronic Signature(s) Signed: 06/17/2020 6:10:08 PM By: [name] Entered By: Cory Gouty on 06/17/2020 10:45:11

## 2020-06-20 NOTE — Progress Notes (Signed)
[name], [name] ([id number]) Visit Report for 06/17/2020 Debridement Details Patient Name: Date of Service: [name], [name] 06/17/2020 10:30 A M Medical Record Number: [id number] Patient Account Number: [id number] Date of Birth/Sex: Treating RN: September 02, 1956 (63 y.o. [name]) [name] Primary Care Provider: [name] Other Clinician: Referring Provider: Treating Provider/Extender: [name] in Treatment: 0 Debridement Performed for Assessment: Wound #12 Right Metatarsal head first Performed By: Physician [name]., MD Debridement Type: Debridement Level of Consciousness (Pre-procedure): Awake and Alert Pre-procedure Verification/Time Out Yes - 11:48 Taken: Start Time: 11:48 T Area Debrided (L x W): otal 3.6 (cm) x 3.3 (cm) = 11.88 (cm) Tissue and other material debrided: Viable, Non-Viable, Callus, Subcutaneous Level: Skin/Subcutaneous Tissue Debridement Description: Excisional Instrument: Blade, Forceps Bleeding: Moderate Hemostasis Achieved: Pressure End Time: 11:50 Procedural Pain: 0 Post Procedural Pain: 0 Response to Treatment: Procedure was tolerated well Level of Consciousness (Post- Awake and Alert procedure): Post Debridement Measurements of Total Wound Length: (cm) 3.6 Width: (cm) 3.3 Depth: (cm) 0.2 Volume: (cm) 1.866 Character of Wound/Ulcer Post Debridement: Improved Post Procedure Diagnosis Same as Pre-procedure Electronic Signature(s) Signed: 06/17/2020 6:00:01 PM By: [name] MD Signed: 06/20/2020 5:42:58 PM By: [name] RN Entered By: [name] on 06/17/2020 12:46:55 -------------------------------------------------------------------------------- HPI Details Patient Name: Date of Service: [name], [name] 06/17/2020 10:30 A M Medical Record Number: [id number] Patient Account Number: [id number] Date of Birth/Sex: Treating RN: [birth year] (63 y.o. Oval [name] Primary Care Provider:  [name] Other Clinician: Referring Provider: Treating Provider/Extender: [name] in Treatment: 0 History of Present Illness Location: right first metatarsal head on the plantar aspect and the left big toe and the right fifth toe Quality: Patient reports No Pain. Severity: Patient states wound(s) are getting worse. Duration: Patient has had the wound for > 24 months prior to seeking treatment at the wound center Context: The wound would happen gradually Modifying Factors: Wound improving due to current treatment. he has been under podiatry care for over a year and a half ssociated Signs and Symptoms: Patient reports having: heaviness in both legs A HPI Description: This 65 year old gentleman who has had a long-standing neuropathy of both feet without history of diabetes mellitus has never been worked up for his neurological problem in the last 5 years or so. Most recently he has been seen by the podiatrist Dr. [name] who was been seeing them for about a year and a half and from what I understand has been treating him for bilateral ulceration on the feet, the right being in the region of the first metatarsal head plantar aspect and the left being in the area of the left big toe. He's had various antibiotics including Levaquin recently and this was then switched to Cipro and clindamycin. Due to the nature of this wound which has been there for a long while he has been recently referred to [name] for an opinion. An MRI was done in the middle of June which showed several small abscesses near the amputation site of the right great toe with draining and open wounds with surrounding cellulitis but no evidence of septic arthritis, osteomyelitis or pyomyositis. I understand at this stage he was taken to the operating room by [name] who debrided the wound and cleaned out the abscess. I understand ABIs were done bilaterally and they were within normal limits at  Dr. [name] office. The patient is a smoker and is working on quitting smoking. 07/13/2017 --  had a lower extremity arterial duplex evaluation which showed patent bilateral lower extremity arteries without evidence of hemodynamically significant stenosis. His right ABI was 1.05 and the left was 0.95 He had a lower extremity venous duplex reflux evaluation done which showed significant venous incompetence in the bilateral common femoral, saphenofemoral junction, small saphenous vein and in the right great saphenous vein. With these findings I believe he will benefit from a consultation with the vascular surgeon regarding the endovenous ablation procedure. the patient's neurology appointment and dermatology appointment is still pending. He has had a x-ray done by his PCP and the results are not available. However he did see [name] who I understand has ordered a bilateral MRI of his feet. 07/26/2017 -- was seen by [name] on 07/20/2017. His impression was that he presented with minimal bilateral lower extremity peripheral arterial disease, bilateral lower extremity chronic venous insufficiency (C4) and radiculopathy versus neuropathy bilateral lower extremity. No arterial intervention was needed at the present time. He recommended maximal medical management at this stage for his atherosclerotic disease . He recommended compression to be continued and no venous intervention at the present time. He was offered follow-up in the Vein clinic in 3 months time. MR of the right foot done with and without contrast -- IMPRESSION:Wound on the plantar surface of the foot at approximately the level of first MTP joint with a tiny underlying soft tissue abscess. Large area of soft tissue edema subjacent to the wound is most compatible with granulation tissue. Negative for osteomyelitis about the first MTP joint. Mild edema and enhancement in the proximal phalanx of the little toe is stable compared to the  prior examination and may be due to stress change. Infection is possible but thought highly unlikely. Subcutaneous edema over the dorsum of the foot compatible with dependent change/cellulitis. the patient also has had a abdominal CT which shows a kidney mass and is going to have her MRI for this. He also has a urology opinion pending and a general surgeon's opinion regarding a colonic mass. 08/02/2017 -- his appointments with his other urology and surgical consultants is still later. He has an MRI of the left foot later this week. 08/09/2017 --MR of the left foot with and without contrast -- IMPRESSION:Large appearing skin wound on the medial aspect of the great toe without underlying abscess or osteomyelitis.First MTP osteoarthritis. Marked marrow edema in the medial sesamoid bone is likely related to osteoarthritis but could be due to sesamoiditis. 08/16/2017 -- the patient has had a surgical opinion and a urological opinion and a extensive surgery including possible colon resection, bladder surgery and kidney surgery is being planned. No date has been set for his procedure He continues to be off smoking 08/31/2017 -- he was seen by the neurologist for his multiple problems of neuropathic and radicular symptoms in both legs with a thorough workup done. MRIs were reviewed lab work was reviewed and the assessment was that he had severe neuropathy due to multifactorial reasons and a neuropathy panel of lab work was ordered and he would be reviewed back. Patient is also due for colonoscopy to be repeated before his bladder and kidney surgery. At the present time he would like [name] to continue with conservative and symptomatic wound care for both his feet. 10/05/2017 -- since I have seen him last time he has had a colonoscopy which was fairly normal except for a polyp and he has a urology appointment pending later today. He still continues to stay  off smoking. 10/26/2017-- he returns after about 3  weeks and in the meanwhile he was seen by Dr. Kellie Simmering, who reviewed his venous studies and noted that there was very minimal enlargement of the bilateral great saphenous vein on the right and somewhat larger vein on the left but no consistent reflux throughout the great saphenous veins and there was also some right small saphenous veins which were enlarged with some reflux. However overall he thought that venous insufficiency was not significantly the cause of his ulceration and it was more of trophic ulcers and hence he recommended aggressive wound care, and at some stage he may need amputation of the left first toe and the right fifth toe. 11/09/2017 -- due to the inclement weather he has missed his urology appointment and is still awaiting the rescheduled one. Other than that he's been doing fairly well. 11/23/2017 -- he has had his cystoscopy done by his urologist and they found multiple areas of concern and he is going to be set up for a procedure sometime later in January. He is on complex care as far as his wounds go and seizures every 2-3 weeks 12/07/17 patient presents today for evaluation concerning his right great toe amputation site as well as his left great toe ulcer. He has been tolerating the dressing changes fairly well. With that being said there does not appear to be any significant discomfort at this point that he is experiencing although he has quite significant callous especially in regard to the amputation site of the right great toe. Nonetheless he has no evidence of infection which is good news No fevers, chills, nausea, or vomiting noted at this time. He has no pain. READMISSION 05/09/18; is a patient who hasn't been here in quite some time. He is not a diabetic but he has severe idiopathic peripheral neuropathy. He was here for review of a nonhealing wound on the right great toe amputation site and a large portion of the tip of the left great toe. Sometime after his last visit  here he became ill he had abdominal issues infections and required hospitalizations. He largely dropped off the map. He is putting silver alginate on the wounds. I note that he had arterial studies and saw Dr. Bridgett Larsson of vascular surgery was not felt to have an arterial issue. He also had reflux studies and he wears compression stockings and he is faithful with these. He had MRI of both of his feet that not show osteomyelitis question small abscess on the right. He tells me that the nonhealing wound on his right great toe amputation site is been there since the actual amputation which was in 2014 I note the area was aggressively debrided by Dr. Jacqualyn Posey of podiatry before he started coming to this clinic apparently there were abscess is present at that time. His last formal arterial studies were done in August 2018 which time his ABI was 1.05 on the right and 0.95 on the left he had biphasic and triphasic waveforms on the left and monophasic and biphasic waveforms on the right. He did not have TBI's. ABIs in our clinic today were 0.8 bilaterally. The patient states he is not a diabetic. He does have idiopathic peripheral neuropathy. He is a smoker but is trying to quit with Chantix. 05/16/18; x-rays of his bilateral feet were negative for osteomyelitis. He was noted to have a chronic deformity of the head of the second metatarsal which may be due to previous osteonecrosis there was no definite underlying  bone destruction to suggest osteomyelitis on either side. He came in with the extensive callus over his right great toe amputation site and the left great toe did extensive debridement last week and another extensive debridement today.patient is changing his dressing himself with silver alginate 05/30/18; still buildup of thick callus covering thick subcutaneous tissue around both of these areas. This requires an extensive debridement on both sides. I have discussed offloading this area with the patient. I  think he inverts when he walks. His foot sizes beyond what we can put in a Darco forefoot off loader or probably what we can put in a total contact cast. 06/13/18; still a remarkable buildup of callus over the right first toe amputation site. The left first toe has 2 open wounds one medially and 1 at the tip of the toe. Surrounding callus is also not back here. We've been using silver alginate. He has a darco forefoot off loader now on the right foot.his own shoe on the left foot. Would like to try a total contact cast on the right. He is making arrangements for someone to drive him here in 2 weeks. 06/29/18; still a buildup of callus over the right first toe amputation site although not as significant as previously. The left first toe has 2 open wounds one medially and one at the tip surrounding callus as well. We've been using silver alginate. He comes in today with the right for a total contact cast which I'll use on the right 07/04/18 on evaluation today patient appears to be doing decently well in regard to the cast. Unfortunately he did have a little area that rubbed on the lateral aspect of his leg the cast actually cracked on the medial aspect this happened he states just yesterday. He has not been walking in the cast without the boot portion on. With that being said he states that he is unsure of exactly what happened with the crack. Nonetheless this does seem to have caused a little bit of rubbing on the lateral aspect I think due to the integrity of the cast being compromised. Nonetheless I advised him that if this happens in the future he needs to let us know soon as possible. Nonetheless I do feel like the cast has been of benefit some of the area on the distal portion of his great toe amputation site actually appears to be doing better. 07/12/18 on evaluation today patient appears to be doing very well in regard to his right great toe invitation site. This seems to be showing signs of  excellent improvement which is great news. There does not appear to be any evidence of infection at this time. With that being said he has been tolerating the dressing changes without complication. There appears to be no evidence of infection and again this is barely open at this point. The total contact cast has been of excellent benefit for him. I believe this will likely be completely closed and ready next week that we can switch to a cast on the left foot. He states he may want to take a week's break. 07/19/18 on evaluation today patient appears to be doing better in regard to both ulceration areas. The right foot actually appears to be completely healed he still has some callous but no open wound. The left foot/great toe actually seems to be better after last week's debridement overall the appearance of the wound is greatly improved. 08/09/18 on evaluation today patient actually appears to be doing a little  bit worse in regard to the right metatarsal invitation site. He's been tolerating the dressing changes without complication. With that being said he does note that the area actually cracked open as of today when he was in the shower trying to scrub it. Nonetheless it does appear that the Caryn Section that previously was open and draining has subsequently reopened and is draining again. Nonetheless as I was looking at him and performing the debridement today I did inquire about whether or not we had ever biopsy the area as I was concerned about the possibility for something along the lines of a work type virus/infection causing the excessive callous buildup. Especially since he seems to grow much more than what would just be traditionally expected by friction alone. He subsequently told me that he did have this biopsied somewhere around 1-2 years ago by Dr. Jacqualyn Posey and that it showed that he had a wart infection at that point. Nonetheless he was never referred to dermatology better Jacqualyn Posey attempted to  cut it out according to the patient. Nonetheless that may be part of the big issue with what we're dealing with here and without treating the wart infection we may not actually get this to heal and stay closed. READMISSION 12/07/2018 The patient is back in clinic today essentially with wounds in the same area and condition is that I think the last 2 times he has been here. This includes the right first metatarsal head in the setting of a previous right great toe amputation and a large part of the medial part of his left great toe. The patient states he has been dealing with this for years dating back to 2005 on and off. That he is never found this to be totally healed. When he was last in this clinic he had this biopsied shave biopsies which showed hyperkeratotic and parakeratotic debris. Epithelium with papillary features negative for malignancy. He has been using silver alginate. He has healing sandals which he uses Felt on the bottom of these. When he was here last time we put him a total contact cast at least for a while and the patient states on the right that helped with the buildup of hyperkeratotic tissue and that seems to be reflective in our notes. He also states that was previously biopsied by Dr. Jacqualyn Posey of podiatry. The patient has a severe idiopathic peripheral neuropathy which he attributes to a brown recluse spider bite in 2002 or so. He is not a diabetic The patient had arterial studies in this showed an ABI in the right of 1.05 on the left was 0.95. TBI's were not done. He had monophasic waveforms at the right posterior tibial biphasic at the dorsalis pedis. On the left he was biphasic at the posterior tibial and triphasic at the dorsalis pedis. 1/17; this is a patient with a very challenging issue was readmitted to the clinic last week. He has a large area over the first right MTP in the setting of a previous right great toe amputation. On presentation this is covered with copious  amounts of hyperkeratotic thick callus nonviable tissue. When this is debridement both on this occasion and previous occasions in this clinic he has a very boggy presentation to the subcutaneous tissue which looks somewhat atypical. He has a similar area on the left great toe from the tip of the toe down the medial aspect beyond the interphalangeal joint. He's had previous MRIs of both of his feet. On the right that showed small abscesses and he  was taken to the OR by podiatry for debridement and cleaning out of the abscesses. An MRI did not show osteomyelitis of the left first toe. Both the MRIs were done in 2018. He saw Dr. Vallarie Mare at the time who did not think he had significant macrovascular disease. He is not a diabetic but he has idiopathic peripheral neuropathy and was a significant smoker. When he was in the clinic earlier in 2019 he had biopsies done of both areas that showed hyperkeratotic material but no malignancy. X-rays I ordered last week did not show osteomyelitis on the left but did suggest osteomyelitis at the tip of the left great toe. I looked at the x-rays myself this is a subtle finding it is only seen on the lateral film. I did a very significant debridement on him last week bilaterally in both the wound areas look better. When he was previously in our clinic the area on the right did improve with a total contact cast. He dropped out of the schedule at that point I'm not exactly sure why 1/24; the biopsy I did of the area over his right first metatarsal head showed the possibility of verruca vulgaris. Superimposed changes of lichen simplex chronicus. If this is a plantar wart this would be extremely large. I think he would need topical destructive therapy and I am going to refer him to dermatology. There was no suggestion of malignancy. Previous biopsies in this clinic on both his areas did not show evidence of malignancy either. Until we get dermatology I am going to continue with  silver alginate. X-rays suggested osteomyelitis of the left toe I am going to continue him on trimethoprim sulfamethoxazole 2/3; not much change in this area on the right first met head. Extensive dark eschar formation around the central area of nonviable subcutaneous tissue looking like a boggy subcutaneous tissue. Using a #10 scalpel and pickups I removed all of this. I have tried this before and this man but I plan to put him in a total contact cast next week Using a #5 curette debridement of the left great toe He has an appointment with dermatology at [facility] on the 25th 2/11; right first metatarsal head after last week's extensive debridement is right back to the way it was with a nonviable subcutaneous tissue and thick surrounding eschar. The left great toe looks about the same. The patient could not have a total contact cast today because he had household problems [leaking roof] but I am not going to put a cast on him until I have information from the dermatologist about this. READMISSION 06/17/2020 Mr. Nair is now a 64 year old man that we have had in this clinic on at least 2 occasions previously. When he was last here in February 2020 I referred him to dermatology at [facility] for their review of large wounds on the right first metatarsal head with surrounding severe chronic hyperkeratosis. This was biopsied on at least 2 occasions in our clinic and I think at [facility] as well that did not show an underlying malignancy. I have reviewed the consult from Dr. Sharlette Dense dermatology from 01/23/2019. At that time the patient had wounds on the plantar right first metatarsal head as well as the plantar left great toe. From what he is saying the left great toe healed however he recently had to have a left TMA by Dr. 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 Dermatology at [facility] apparently referred him  to Dr. Dawna Part of plastic surgery and an operative  excision of this area was planned however apparently they ran into the pandemic. The procedure was repetitively cancel and the patient did not keep his final appointment which I think was earlier this year he was also seen in February of this year at the wound care center in [facility] by Dr. Ronda Fairly. They were applying Silvadene cream and Xeroform which she is essentially doing now. I see in his notes that there was still some concern about underlying malignancy although would be difficult to believe as long as this is been there that there would not be more extensive damage at this point in any case he is back in our clinic for review of this. He had an x-ray of the foot on 01/23/2020 that was negative for osteomyelitis acute fracture. Past medical history; idiopathic peripheral neuropathy, right great toe amputation in 2011, lumbar spinal stenosis, left transmetatarsal amputation on 05/30/2020 by Dr. Lajoyce Corners and more recently apparently a slight wound dehiscence. He also had a fracture of his left tibial plateau sometime in 2020. ABI in our clinic was 0.95 Electronic Signature(s) Signed: 06/17/2020 6:00:01 PM By: Baltazar Najjar MD Entered By: Baltazar Najjar on 06/17/2020 12:53:42 -------------------------------------------------------------------------------- Physical Exam Details Patient Name: Date of Service: [name], [name] 06/17/2020 10:30 A M Medical Record Number: [id number] Patient Account Number: [id number] Date of Birth/Sex: Treating RN: [birth year] (63 y.o. Judie Petit) Yevonne Pax Primary Care Provider: Alysia Penna Other Clinician: Referring Provider: Treating Provider/Extender: Jerl Mina in Treatment: 0 Constitutional Patient is hypertensive.. Pulse regular and within target range for patient.Marland Kitchen Respirations regular, non-labored and within target range.. Temperature is normal and within the target range for the patient.Marland Kitchen Appears in no  distress. Cardiovascular Pedal pulses are palpable. Musculoskeletal Left first toe amputation. Integumentary (Hair, Skin) No primary skin disorder is seen. Notes Wound exam Extensive area on the left plantar first metatarsal head. Wound itself does not look too ominous however there is thick raised hyperkeratotic material around the 100% of the circumference. It is hard to believe he is actually walking on this area. I used a #10 scalpel and remove the hyperkeratotic area from the circumference of the wound. This bleeds very freely hemostasis with silver nitrate and a pressure dressing. This does not reveal any epithelialized tissue. This is simply extensive wound in fact I think there is more wound under the hyperkeratotic area wherever it appears. I did not look at the left foot today. Electronic Signature(s) Signed: 06/17/2020 6:00:01 PM By: Baltazar Najjar MD Entered By: Baltazar Najjar on 06/17/2020 12:55:45 -------------------------------------------------------------------------------- Physician Orders Details Patient Name: Date of Service: [name], [name] 06/17/2020 10:30 A M Medical Record Number: [id number] Patient Account Number: [id number] Date of Birth/Sex: Treating RN: Jun 24, 1956 (64 y.o. Elizebeth Koller Primary Care Provider: Alysia Penna Other Clinician: Referring Provider: Treating Provider/Extender: Jerl Mina in Treatment: 0 Verbal / Phone Orders: No Diagnosis Coding Follow-up Appointments Return Appointment in 1 week. Dressing Change Frequency Wound #12 Right Metatarsal head first Change Dressing every other day. Wound Cleansing Wound #12 Right Metatarsal head first Clean wound with Wound Cleanser - or normal saline Primary Wound Dressing Wound #12 Right Metatarsal head first Polymem Silver Secondary Dressing Wound #12 Right Metatarsal head first Kerlix/Rolled Gauze Dry Gauze Other: - felt callous pad or foam  donut Off-Loading Open toe surgical shoe to: - right foot Home Health Continue Home Health skilled nursing for wound care. -  Alvis Lemmings Electronic Signature(s) Signed: 06/17/2020 6:00:01 PM By: [name] MD Signed: 06/19/2020 5:02:14 PM By: Levan Hurst RN, BSN Entered By: Levan Hurst on 06/17/2020 11:55:37 -------------------------------------------------------------------------------- Problem List Details Patient Name: Date of Service: [name], [name] 06/17/2020 10:30 A M Medical Record Number: [id number] Patient Account Number: [id number] Date of Birth/Sex: Treating RN: 1956/05/23 (63 y.o. [name]) [name] Primary Care Provider: [name] Other Clinician: Referring Provider: Treating Provider/Extender: [name] in Treatment: 0 Active Problems ICD-10 Encounter Code Description Active Date MDM Diagnosis L97.518 Non-pressure chronic ulcer of other part of right foot with other specified 06/17/2020 No Yes severity G90.09 Other idiopathic peripheral autonomic neuropathy 06/17/2020 No Yes Inactive Problems Resolved Problems Electronic Signature(s) Signed: 06/17/2020 6:00:01 PM By: [name] MD Entered By: [name] on 06/17/2020 12:21:04 -------------------------------------------------------------------------------- Progress Note Details Patient Name: Date of Service: [name], [name] 06/17/2020 10:30 A M Medical Record Number: [id number] Patient Account Number: [id number] Date of Birth/Sex: Treating RN: Apr 16, 1956 (63 y.o. [name]) [name] Primary Care Provider: [name] Other Clinician: Referring Provider: Treating Provider/Extender: [name] in Treatment: 0 Subjective History of Present Illness (HPI) The following HPI elements were documented for the patient's wound: Location: right first metatarsal head on the plantar aspect and the left big toe and the right fifth toe Quality:  Patient reports No Pain. Severity: Patient states wound(s) are getting worse. Duration: Patient has had the wound for > 24 months prior to seeking treatment at the wound center Context: The wound would happen gradually Modifying Factors: Wound improving due to current treatment. he has been under podiatry care for over a year and a half Associated Signs and Symptoms: Patient reports having: heaviness in both legs This 64 year old gentleman who has had a long-standing neuropathy of both feet without history of diabetes mellitus has never been worked up for his neurological problem in the last 5 years or so. Most recently he has been seen by the podiatrist Dr. [name] who was been seeing them for about a year and a half and from what I understand has been treating him for bilateral ulceration on the feet, the right being in the region of the first metatarsal head plantar aspect and the left being in the area of the left big toe. He's had various antibiotics including Levaquin recently and this was then switched to Cipro and clindamycin. Due to the nature of this wound which has been there for a long while he has been recently referred to [name] for an opinion. An MRI was done in the middle of June which showed several small abscesses near the amputation site of the right great toe with draining and open wounds with surrounding cellulitis but no evidence of septic arthritis, osteomyelitis or pyomyositis. I understand at this stage he was taken to the operating room by [name] who debrided the wound and cleaned out the abscess. I understand ABIs were done bilaterally and they were within normal limits at Dr. [name] office. The patient is a smoker and is working on quitting smoking. 07/13/2017 -- had a lower extremity arterial duplex evaluation which showed patent bilateral lower extremity arteries without evidence of hemodynamically significant stenosis. His right ABI was 1.05 and the left was  0.95 He had a lower extremity venous duplex reflux evaluation done which showed significant venous incompetence in the bilateral common femoral, saphenofemoral junction, small saphenous vein and in the right great saphenous vein. With these findings I  believe he will benefit from a consultation with the vascular surgeon regarding the endovenous ablation procedure. the patient's neurology appointment and dermatology appointment is still pending. He has had a x-ray done by his PCP and the results are not available. However he did see [name] who I understand has ordered a bilateral MRI of his feet. 07/26/2017 -- was seen by [name] on 07/20/2017. His impression was that he presented with minimal bilateral lower extremity peripheral arterial disease, bilateral lower extremity chronic venous insufficiency (C4) and radiculopathy versus neuropathy bilateral lower extremity. No arterial intervention was needed at the present time. He recommended maximal medical management at this stage for his atherosclerotic disease . He recommended compression to be continued and no venous intervention at the present time. He was offered follow-up in the Vein clinic in 3 months time. MR of the right foot done with and without contrast -- IMPRESSION:Wound on the plantar surface of the foot at approximately the level of first MTP joint with a tiny underlying soft tissue abscess. Large area of soft tissue edema subjacent to the wound is most compatible with granulation tissue. Negative for osteomyelitis about the first MTP joint. Mild edema and enhancement in the proximal phalanx of the little toe is stable compared to the prior examination and may be due to stress change. Infection is possible but thought highly unlikely. Subcutaneous edema over the dorsum of the foot compatible with dependent change/cellulitis. the patient also has had a abdominal CT which shows a kidney mass and is going to have her MRI for  this. He also has a urology opinion pending and a general surgeon's opinion regarding a colonic mass. 08/02/2017 -- his appointments with his other urology and surgical consultants is still later. He has an MRI of the left foot later this week. 08/09/2017 --MR of the left foot with and without contrast -- IMPRESSION:Large appearing skin wound on the medial aspect of the great toe without underlying abscess or osteomyelitis.First MTP osteoarthritis. Marked marrow edema in the medial sesamoid bone is likely related to osteoarthritis but could be due to sesamoiditis. 08/16/2017 -- the patient has had a surgical opinion and a urological opinion and a extensive surgery including possible colon resection, bladder surgery and kidney surgery is being planned. No date has been set for his procedure He continues to be off smoking 08/31/2017 -- he was seen by the neurologist for his multiple problems of neuropathic and radicular symptoms in both legs with a thorough workup done. MRIs were reviewed lab work was reviewed and the assessment was that he had severe neuropathy due to multifactorial reasons and a neuropathy panel of lab work was ordered and he would be reviewed back. Patient is also due for colonoscopy to be repeated before his bladder and kidney surgery. At the present time he would like [name] to continue with conservative and symptomatic wound care for both his feet. 10/05/2017 -- since I have seen him last time he has had a colonoscopy which was fairly normal except for a polyp and he has a urology appointment pending later today. He still continues to stay off smoking. 10/26/2017-- he returns after about 3 weeks and in the meanwhile he was seen by Dr. Kellie Simmering, who reviewed his venous studies and noted that there was very minimal enlargement of the bilateral great saphenous vein on the right and somewhat larger vein on the left but no consistent reflux throughout the great saphenous veins and there  was also some right small saphenous  veins which were enlarged with some reflux. However overall he thought that venous insufficiency was not significantly the cause of his ulceration and it was more of trophic ulcers and hence he recommended aggressive wound care, and at some stage he may need amputation of the left first toe and the right fifth toe. 11/09/2017 -- due to the inclement weather he has missed his urology appointment and is still awaiting the rescheduled one. Other than that he's been doing fairly well. 11/23/2017 -- he has had his cystoscopy done by his urologist and they found multiple areas of concern and he is going to be set up for a procedure sometime later in January. He is on complex care as far as his wounds go and seizures every 2-3 weeks 12/07/17 patient presents today for evaluation concerning his right great toe amputation site as well as his left great toe ulcer. He has been tolerating the dressing changes fairly well. With that being said there does not appear to be any significant discomfort at this point that he is experiencing although he has quite significant callous especially in regard to the amputation site of the right great toe. Nonetheless he has no evidence of infection which is good news No fevers, chills, nausea, or vomiting noted at this time. He has no pain. READMISSION 05/09/18; is a patient who hasn't been here in quite some time. He is not a diabetic but he has severe idiopathic peripheral neuropathy. He was here for review of a nonhealing wound on the right great toe amputation site and a large portion of the tip of the left great toe. Sometime after his last visit here he became ill he had abdominal issues infections and required hospitalizations. He largely dropped off the map. He is putting silver alginate on the wounds. I note that he had arterial studies and saw Dr. Bridgett Larsson of vascular surgery was not felt to have an arterial issue. He also had reflux  studies and he wears compression stockings and he is faithful with these. He had MRI of both of his feet that not show osteomyelitis question small abscess on the right. He tells me that the nonhealing wound on his right great toe amputation site is been there since the actual amputation which was in 2014 I note the area was aggressively debrided by Dr. Jacqualyn Posey of podiatry before he started coming to this clinic apparently there were abscess is present at that time. His last formal arterial studies were done in August 2018 which time his ABI was 1.05 on the right and 0.95 on the left he had biphasic and triphasic waveforms on the left and monophasic and biphasic waveforms on the right. He did not have TBI's. ABIs in our clinic today were 0.8 bilaterally. The patient states he is not a diabetic. He does have idiopathic peripheral neuropathy. He is a smoker but is trying to quit with Chantix. 05/16/18; x-rays of his bilateral feet were negative for osteomyelitis. He was noted to have a chronic deformity of the head of the second metatarsal which may be due to previous osteonecrosis there was no definite underlying bone destruction to suggest osteomyelitis on either side. He came in with the extensive callus over his right great toe amputation site and the left great toe did extensive debridement last week and another extensive debridement today.patient is changing his dressing himself with silver alginate 05/30/18; still buildup of thick callus covering thick subcutaneous tissue around both of these areas. This requires an extensive debridement on  both sides. I have discussed offloading this area with the patient. I think he inverts when he walks. His foot sizes beyond what we can put in a Darco forefoot off loader or probably what we can put in a total contact cast. 06/13/18; still a remarkable buildup of callus over the right first toe amputation site. The left first toe has 2 open wounds one medially and  1 at the tip of the toe. Surrounding callus is also not back here. We've been using silver alginate. He has a darco forefoot off loader now on the right foot.his own shoe on the left foot. Would like to try a total contact cast on the right. He is making arrangements for someone to drive him here in 2 weeks. 06/29/18; still a buildup of callus over the right first toe amputation site although not as significant as previously. The left first toe has 2 open wounds one medially and one at the tip surrounding callus as well. We've been using silver alginate. He comes in today with the right for a total contact cast which I'll use on the right 07/04/18 on evaluation today patient appears to be doing decently well in regard to the cast. Unfortunately he did have a little area that rubbed on the lateral aspect of his leg the cast actually cracked on the medial aspect this happened he states just yesterday. He has not been walking in the cast without the boot portion on. With that being said he states that he is unsure of exactly what happened with the crack. Nonetheless this does seem to have caused a little bit of rubbing on the lateral aspect I think due to the integrity of the cast being compromised. Nonetheless I advised him that if this happens in the future he needs to let us know soon as possible. Nonetheless I do feel like the cast has been of benefit some of the area on the distal portion of his great toe amputation site actually appears to be doing better. 07/12/18 on evaluation today patient appears to be doing very well in regard to his right great toe invitation site. This seems to be showing signs of excellent improvement which is great news. There does not appear to be any evidence of infection at this time. With that being said he has been tolerating the dressing changes without complication. There appears to be no evidence of infection and again this is barely open at this point. The total contact  cast has been of excellent benefit for him. I believe this will likely be completely closed and ready next week that we can switch to a cast on the left foot. He states he may want to take a week's break. 07/19/18 on evaluation today patient appears to be doing better in regard to both ulceration areas. The right foot actually appears to be completely healed he still has some callous but no open wound. The left foot/great toe actually seems to be better after last week's debridement overall the appearance of the wound is greatly improved. 08/09/18 on evaluation today patient actually appears to be doing a little bit worse in regard to the right metatarsal invitation site. He's been tolerating the dressing changes without complication. With that being said he does note that the area actually cracked open as of today when he was in the shower trying to scrub it. Nonetheless it does appear that the Caryn Section that previously was open and draining has subsequently reopened and is draining again. Nonetheless as  I was looking at him and performing the debridement today I did inquire about whether or not we had ever biopsy the area as I was concerned about the possibility for something along the lines of a work type virus/infection causing the excessive callous buildup. Especially since he seems to grow much more than what would just be traditionally expected by friction alone. He subsequently told me that he did have this biopsied somewhere around 1-2 years ago by Dr. Ardelle Anton and that it showed that he had a wart infection at that point. Nonetheless he was never referred to dermatology better Ardelle Anton attempted to cut it out according to the patient. Nonetheless that may be part of the big issue with what we're dealing with here and without treating the wart infection we may not actually get this to heal and stay closed. READMISSION 12/07/2018 The patient is back in clinic today essentially with wounds in the same  area and condition is that I think the last 2 times he has been here. This includes the right first metatarsal head in the setting of a previous right great toe amputation and a large part of the medial part of his left great toe. The patient states he has been dealing with this for years dating back to 2005 on and off. That he is never found this to be totally healed. When he was last in this clinic he had this biopsied shave biopsies which showed hyperkeratotic and parakeratotic debris. Epithelium with papillary features negative for malignancy. He has been using silver alginate. He has healing sandals which he uses Felt on the bottom of these. When he was here last time we put him a total contact cast at least for a while and the patient states on the right that helped with the buildup of hyperkeratotic tissue and that seems to be reflective in our notes. He also states that was previously biopsied by Dr. Ardelle Anton of podiatry. The patient has a severe idiopathic peripheral neuropathy which he attributes to a brown recluse spider bite in 2002 or so. He is not a diabetic The patient had arterial studies in this showed an ABI in the right of 1.05 on the left was 0.95. TBI's were not done. He had monophasic waveforms at the right posterior tibial biphasic at the dorsalis pedis. On the left he was biphasic at the posterior tibial and triphasic at the dorsalis pedis. 1/17; this is a patient with a very challenging issue was readmitted to the clinic last week. He has a large area over the first right MTP in the setting of a previous right great toe amputation. On presentation this is covered with copious amounts of hyperkeratotic thick callus nonviable tissue. When this is debridement both on this occasion and previous occasions in this clinic he has a very boggy presentation to the subcutaneous tissue which looks somewhat atypical. He has a similar area on the left great toe from the tip of the toe down the  medial aspect beyond the interphalangeal joint. He's had previous MRIs of both of his feet. On the right that showed small abscesses and he was taken to the OR by podiatry for debridement and cleaning out of the abscesses. An MRI did not show osteomyelitis of the left first toe. Both the MRIs were done in 2018. He saw Dr. Johny Drilling at the time who did not think he had significant macrovascular disease. He is not a diabetic but he has idiopathic peripheral neuropathy and was a significant smoker. When  he was in the clinic earlier in 2019 he had biopsies done of both areas that showed hyperkeratotic material but no malignancy. X-rays I ordered last week did not show osteomyelitis on the left but did suggest osteomyelitis at the tip of the left great toe. I looked at the x-rays myself this is a subtle finding it is only seen on the lateral film. I did a very significant debridement on him last week bilaterally in both the wound areas look better. When he was previously in our clinic the area on the right did improve with a total contact cast. He dropped out of the schedule at that point I'm not exactly sure why 1/24; the biopsy I did of the area over his right first metatarsal head showed the possibility of verruca vulgaris. Superimposed changes of lichen simplex chronicus. If this is a plantar wart this would be extremely large. I think he would need topical destructive therapy and I am going to refer him to dermatology. There was no suggestion of malignancy. Previous biopsies in this clinic on both his areas did not show evidence of malignancy either. Until we get dermatology I am going to continue with silver alginate. X-rays suggested osteomyelitis of the left toe I am going to continue him on trimethoprim sulfamethoxazole 2/3; not much change in this area on the right first met head. Extensive dark eschar formation around the central area of nonviable subcutaneous tissue looking like a boggy subcutaneous  tissue. Using a #10 scalpel and pickups I removed all of this. I have tried this before and this man but I plan to put him in a total contact cast next week ooUsing a #5 curette debridement of the left great toe ooHe has an appointment with dermatology at [facility] on the 25th 2/11; right first metatarsal head after last week's extensive debridement is right back to the way it was with a nonviable subcutaneous tissue and thick surrounding eschar. The left great toe looks about the same. The patient could not have a total contact cast today because he had household problems [leaking roof] but I am not going to put a cast on him until I have information from the dermatologist about this. READMISSION 06/17/2020 Mr. Sustaita is now a 64 year old man that we have had in this clinic on at least 2 occasions previously. When he was last here in February 2020 I referred him to dermatology at [facility] for their review of large wounds on the right first metatarsal head with surrounding severe chronic hyperkeratosis. This was biopsied on at least 2 occasions in our clinic and I think at [facility] as well that did not show an underlying malignancy. I have reviewed the consult from Dr. Sharlette Dense dermatology from 01/23/2019. At that time the patient had wounds on the plantar right first metatarsal head as well as the plantar left great toe. From what he is saying the left great toe healed however he recently had to have a left TMA by Dr. 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 Dermatology at [facility] apparently referred him to Dr. Luetta Nutting of plastic surgery and an operative excision of this area was planned however apparently they ran into the pandemic. The procedure was repetitively cancel and the patient did not keep his final appointment which I think was earlier this year he was also seen in February of this year at the wound care center in [facility] by Dr. Zigmund Daniel. They were  applying  Silvadene cream and Xeroform which she is essentially doing now. I see in his notes that there was still some concern about underlying malignancy although would be difficult to believe as long as this is been there that there would not be more extensive damage at this point in any case he is back in our clinic for review of this. He had an x-ray of the foot on 01/23/2020 that was negative for osteomyelitis acute fracture. Past medical history; idiopathic peripheral neuropathy, right great toe amputation in 2011, lumbar spinal stenosis, left transmetatarsal amputation on 05/30/2020 by Dr. Lajoyce Corners and more recently apparently a slight wound dehiscence. He also had a fracture of his left tibial plateau sometime in 2020. ABI in our clinic was 0.95 Patient History Information obtained from Patient. Allergies No Known Drug Allergies Family History Cancer - Mother,Father,Siblings, Diabetes - Father, Heart Disease - Maternal Grandparents, No family history of Hereditary Spherocytosis, Hypertension, Kidney Disease, Lung Disease, Seizures, Stroke, Thyroid Problems, Tuberculosis. Social History Current some day smoker - 1-2 cig per week, Marital Status - Divorced, Alcohol Use - Never, Drug Use - No History, Caffeine Use - Daily - coffee, tea. Medical History Eyes Denies history of Cataracts, Glaucoma, Optic Neuritis Ear/Nose/Mouth/Throat Denies history of Chronic sinus problems/congestion, Middle ear problems Hematologic/Lymphatic Patient has history of Anemia - hx Denies history of Hemophilia, Human Immunodeficiency Virus, Lymphedema, Sickle Cell Disease Respiratory Patient has history of Chronic Obstructive Pulmonary Disease (COPD) Denies history of Aspiration, Asthma, Pneumothorax, Sleep Apnea, Tuberculosis Cardiovascular Patient has history of Hypertension Denies history of Angina, Arrhythmia, Congestive Heart Failure, Coronary Artery Disease, Deep Vein Thrombosis, Hypotension, Myocardial  Infarction, Peripheral Arterial Disease, Peripheral Venous Disease, Phlebitis, Vasculitis Gastrointestinal Denies history of Cirrhosis , Colitis, Crohnoos, Hepatitis A, Hepatitis B, Hepatitis C Endocrine Denies history of Type I Diabetes, Type II Diabetes Genitourinary Denies history of End Stage Renal Disease Immunological Denies history of Lupus Erythematosus, Raynaudoos, Scleroderma Integumentary (Skin) Denies history of History of Burn Musculoskeletal Patient has history of Osteoarthritis, Osteomyelitis - hx right great toe Denies history of Gout, Rheumatoid Arthritis Neurologic Patient has history of Neuropathy Denies history of Dementia, Quadriplegia, Paraplegia, Seizure Disorder Oncologic Denies history of Received Chemotherapy, Received Radiation Psychiatric Denies history of Anorexia/bulimia, Confinement Anxiety Hospitalization/Surgery History - right great toe amputation. - wisdom tooth extraction. - partial right nephrectomy with bowel resection. - left transmetatarsal amputation. - left knee fracture. Medical A Surgical History Notes nd Constitutional Symptoms (General Health) hypoalbuminemia ,lipomas , verruca (feeto) , preulcerative calluses ,morbid obesity Ear/Nose/Mouth/Throat wisdom tooth extraction , Respiratory necrotizing pneumonia , Cardiovascular edema , Gastrointestinal polyps , removal of portion of colon due to diverticulosis Endocrine hyperglycemia , Genitourinary had 3 holes in bladder due to intestines laying on the bladder, kidney CA Integumentary (Skin) cellulitis , plantar warts , toe ulcers , Musculoskeletal chronic low back pain Oncologic renal cancer - removed from (R) kidney Review of Systems (ROS) Constitutional Symptoms (General Health) Denies complaints or symptoms of Fatigue, Fever, Chills, Marked Weight Change. Eyes Complains or has symptoms of Glasses / Contacts. Ear/Nose/Mouth/Throat Denies complaints or symptoms of  Chronic sinus problems or rhinitis. Respiratory Denies complaints or symptoms of Chronic or frequent coughs, Shortness of Breath. Cardiovascular Denies complaints or symptoms of Chest pain. Gastrointestinal Denies complaints or symptoms of Frequent diarrhea, Nausea, Vomiting. Endocrine Denies complaints or symptoms of Heat/cold intolerance. Genitourinary Denies complaints or symptoms of Frequent urination. Integumentary (Skin) Complains or has symptoms of Wounds - right foot. Musculoskeletal Denies complaints or symptoms of Muscle Pain, Muscle  Weakness. Neurologic Complains or has symptoms of Numbness/parasthesias - both feet. Psychiatric Denies complaints or symptoms of Claustrophobia, Suicidal. Objective Constitutional Patient is hypertensive.. Pulse regular and within target range for patient.Marland Kitchen Respirations regular, non-labored and within target range.. Temperature is normal and within the target range for the patient.Marland Kitchen Appears in no distress. Vitals Time Taken: 10:44 AM, Height: 80 in, Source: Stated, Weight: 350 lbs, Source: Stated, BMI: 38.4, Temperature: 98.4 F, Pulse: 86 bpm, Respiratory Rate: 18 breaths/min, Blood Pressure: 146/80 mmHg. Cardiovascular Pedal pulses are palpable. Musculoskeletal Left first toe amputation. General Notes: Wound exam ooExtensive area on the left plantar first metatarsal head. Wound itself does not look too ominous however there is thick raised hyperkeratotic material around the 100% of the circumference. It is hard to believe he is actually walking on this area. I used a #10 scalpel and remove the hyperkeratotic area from the circumference of the wound. This bleeds very freely hemostasis with silver nitrate and a pressure dressing. This does not reveal any epithelialized tissue. This is simply extensive wound in fact I think there is more wound under the hyperkeratotic area wherever it appears. ooI did not look at the left foot  today. Integumentary (Hair, Skin) No primary skin disorder is seen. Wound #12 status is Open. Original cause of wound was Gradually Appeared. The wound is located on the Right Metatarsal head first. The wound measures 3.6cm length x 3.3cm width x 0.2cm depth; 9.331cm^2 area and 1.866cm^3 volume. There is Fat Layer (Subcutaneous Tissue) Exposed exposed. There is no tunneling or undermining noted. There is a medium amount of serosanguineous drainage noted. The wound margin is thickened. There is large (67-100%) red granulation within the wound bed. There is no necrotic tissue within the wound bed. Assessment Active Problems ICD-10 Non-pressure chronic ulcer of other part of right foot with other specified severity Other idiopathic peripheral autonomic neuropathy Procedures Wound #12 Pre-procedure diagnosis of Wound #12 is a Neuropathic Ulcer-Non Diabetic located on the Right Metatarsal head first . There was a Excisional Skin/Subcutaneous Tissue Debridement with a total area of 11.88 sq cm performed by [name]., MD. With the following instrument(s): Blade, and Forceps to remove Viable and Non-Viable tissue/material. Material removed includes Callus and Subcutaneous Tissue and. No specimens were taken. A time out was conducted at 11:48, prior to the start of the procedure. A Moderate amount of bleeding was controlled with Pressure. The procedure was tolerated well with a pain level of 0 throughout and a pain level of 0 following the procedure. Post Debridement Measurements: 3.6cm length x 3.3cm width x 0.2cm depth; 1.866cm^3 volume. Character of Wound/Ulcer Post Debridement is improved. Post procedure Diagnosis Wound #12: Same as Pre-Procedure Plan Follow-up Appointments: Return Appointment in 1 week. Dressing Change Frequency: Wound #12 Right Metatarsal head first: Change Dressing every other day. Wound Cleansing: Wound #12 Right Metatarsal head first: Clean wound with Wound  Cleanser - or normal saline Primary Wound Dressing: Wound #12 Right Metatarsal head first: Polymem Silver Secondary Dressing: Wound #12 Right Metatarsal head first: Kerlix/Rolled Gauze Dry Gauze Other: - felt callous pad or foam donut Off-Loading: Open toe surgical shoe to: - right foot Home Health: [facility] for wound care. - [facility] 1. I am somewhat surprised that after all this time there is no formal diagnosis here. This area has been biopsied several times out of concern for a verrucous carcinoma or squamous cell. Biopsies have not shown either 1 of these. It was felt by dermatology that  he needed an extensive excision and he was referred to a Mohs surgeon/plastic surgeon. For 1 reason or another this was never done. The patient states the last time he saw Dr. Luetta Nutting of plastic surgery it was felt that the area was too large to consider a full excisional extraction and looking at this and currently I agree with that. This extends into the medial part of the metatarsal phalangeal. 2. In the past these areas are they did respond to total contact cast which again suggest that its not a malignant issue. I told the patient that when his left foot heals I would not be opposed to putting this in a cast to see if this would help at all. Currently he is being dealing with a dehisced wound at the left TMA site [Dr. Duda] 3. I will see this patient again next week I have given him polymen Ag to put over this area. It is difficult to tell him in his current situation to offload this rigorously. I spent 45 minutes in review of this patient's past medical history, recent medical history, consults at [facility] face-to-face evaluation and preparation of this were Electronic Signature(s) Signed: 06/17/2020 6:00:01 PM By: [name] MD Entered By: [name] on 06/17/2020 13:03:06 -------------------------------------------------------------------------------- HxROS  Details Patient Name: Date of Service: [name], [name] 06/17/2020 10:30 A M Medical Record Number: [id number] Patient Account Number: [id number] Date of Birth/Sex: Treating RN: 12-13-1955 (64 y.o. [name] Primary Care Provider: [name] Other Clinician: Referring Provider: Treating Provider/Extender: [name] in Treatment: 0 Information Obtained From Patient Constitutional Symptoms (General Health) Complaints and Symptoms: Negative for: Fatigue; Fever; Chills; Marked Weight Change Medical History: Past Medical History Notes: hypoalbuminemia ,lipomas , verruca (feeto) , preulcerative calluses ,morbid obesity Eyes Complaints and Symptoms: Positive for: Glasses / Contacts Medical History: Negative for: Cataracts; Glaucoma; Optic Neuritis Ear/Nose/Mouth/Throat Complaints and Symptoms: Negative for: Chronic sinus problems or rhinitis Medical History: Negative for: Chronic sinus problems/congestion; Middle ear problems Past Medical History Notes: wisdom tooth extraction , Respiratory Complaints and Symptoms: Negative for: Chronic or frequent coughs; Shortness of Breath Medical History: Positive for: Chronic Obstructive Pulmonary Disease (COPD) Negative for: Aspiration; Asthma; Pneumothorax; Sleep Apnea; Tuberculosis Past Medical History Notes: necrotizing pneumonia , Cardiovascular Complaints and Symptoms: Negative for: Chest pain Medical History: Positive for: Hypertension Negative for: Angina; Arrhythmia; Congestive Heart Failure; Coronary Artery Disease; Deep Vein Thrombosis; Hypotension; Myocardial Infarction; Peripheral Arterial Disease; Peripheral Venous Disease; Phlebitis; Vasculitis Past Medical History Notes: edema , Gastrointestinal Complaints and Symptoms: Negative for: Frequent diarrhea; Nausea; Vomiting Medical History: Negative for: Cirrhosis ; Colitis; Crohns; Hepatitis A; Hepatitis B; Hepatitis C Past  Medical History Notes: polyps , removal of portion of colon due to diverticulosis Endocrine Complaints and Symptoms: Negative for: Heat/cold intolerance Medical History: Negative for: Type I Diabetes; Type II Diabetes Past Medical History Notes: hyperglycemia , Genitourinary Complaints and Symptoms: Negative for: Frequent urination Medical History: Negative for: End Stage Renal Disease Past Medical History Notes: had 3 holes in bladder due to intestines laying on the bladder, kidney CA Integumentary (Skin) Complaints and Symptoms: Positive for: Wounds - right foot Medical History: Negative for: History of Burn Past Medical History Notes: cellulitis , plantar warts , toe ulcers , Musculoskeletal Complaints and Symptoms: Negative for: Muscle Pain; Muscle Weakness Medical History: Positive for: Osteoarthritis; Osteomyelitis - hx right great toe Negative for: Gout; Rheumatoid Arthritis Past Medical History Notes: chronic low back pain Neurologic Complaints and Symptoms: Positive for:  Numbness/parasthesias - both feet Medical History: Positive for: Neuropathy Negative for: Dementia; Quadriplegia; Paraplegia; Seizure Disorder Psychiatric Complaints and Symptoms: Negative for: Claustrophobia; Suicidal Medical History: Negative for: Anorexia/bulimia; Confinement Anxiety Hematologic/Lymphatic Medical History: Positive for: Anemia - hx Negative for: Hemophilia; Human Immunodeficiency Virus; Lymphedema; Sickle Cell Disease Immunological Medical History: Negative for: Lupus Erythematosus; Raynauds; Scleroderma Oncologic Medical History: Negative for: Received Chemotherapy; Received Radiation Past Medical History Notes: renal cancer - removed from (R) kidney Immunizations Pneumococcal Vaccine: Received Pneumococcal Vaccination: Yes Implantable Devices None Hospitalization / Surgery History Type of Hospitalization/Surgery right great toe amputation wisdom tooth  extraction partial right nephrectomy with bowel resection left transmetatarsal amputation left knee fracture Family and Social History Cancer: Yes - Mother,Father,Siblings; Diabetes: Yes - Father; Heart Disease: Yes - Maternal Grandparents; Hereditary Spherocytosis: No; Hypertension: No; Kidney Disease: No; Lung Disease: No; Seizures: No; Stroke: No; Thyroid Problems: No; Tuberculosis: No; Current some day smoker - 1-2 cig per week; Marital Status - Divorced; Alcohol Use: Never; Drug Use: No History; Caffeine Use: Daily - coffee, tea; Financial Concerns: No; Food, Clothing or Shelter Needs: No; Support System Lacking: No; Transportation Concerns: No Engineer, maintenance) Signed: 06/17/2020 6:00:01 PM By: [name] MD Signed: 06/17/2020 6:10:08 PM By: [name] RN, BSN Entered By: [name] on 06/17/2020 10:51:32 -------------------------------------------------------------------------------- [place] Details Patient Name: Date of Service: [name], [name] 06/17/2020 Medical Record Number: [id number] Patient Account Number: [id number] Date of Birth/Sex: Treating RN: [birth year] (63 y.o. Oval [name] Primary Care Provider: [name] Other Clinician: Referring Provider: Treating Provider/Extender: [name] in Treatment: 0 Diagnosis Coding ICD-10 Codes Code Description [phone] Non-pressure chronic ulcer of other part of right foot with other specified severity G90.09 Other idiopathic peripheral autonomic neuropathy Facility Procedures CPT4 Code: [id number] Description: [place] VISIT-LEV 3 EST PT Modifier: 25 Quantity: 1 CPT4 Code: [id number] Description: 77412 - DEB SUBQ TISSUE 20 SQ CM/< ICD-10 Diagnosis Description L97.518 Non-pressure chronic ulcer of other part of right foot with other specified sever Modifier: ity Quantity: 1 Physician Procedures : CPT4 Code Description Modifier [id number] - WC PHYS LEVEL 4  - EST PT 25 ICD-10 Diagnosis Description L97.518 Non-pressure chronic ulcer of other part of right foot with other specified severity G90.09 Other idiopathic peripheral autonomic neuropathy Quantity: 1 : [id number] 62947 - WC PHYS SUBQ TISS 20 SQ CM ICD-10 Diagnosis Description L97.518 Non-pressure chronic ulcer of other part of right foot with other specified severity Quantity: 1 Electronic Signature(s) Signed: 06/19/2020 5:02:14 PM By: Levan Hurst RN, BSN Signed: 06/19/2020 5:29:29 PM By: [name] MD Previous Signature: 06/17/2020 6:00:01 PM Version By: [name] MD Entered By: Levan Hurst on 06/17/2020 18:06:17

## 2020-06-23 ENCOUNTER — Ambulatory Visit (INDEPENDENT_AMBULATORY_CARE_PROVIDER_SITE_OTHER): Payer: Medicare PPO | Admitting: Physician Assistant

## 2020-06-23 ENCOUNTER — Encounter: Payer: Self-pay | Admitting: Physician Assistant

## 2020-06-23 ENCOUNTER — Encounter (HOSPITAL_BASED_OUTPATIENT_CLINIC_OR_DEPARTMENT_OTHER): Payer: Medicare PPO | Admitting: Internal Medicine

## 2020-06-23 DIAGNOSIS — Z89432 Acquired absence of left foot: Secondary | ICD-10-CM

## 2020-06-23 MED ORDER — SULFAMETHOXAZOLE-TRIMETHOPRIM 800-160 MG PO TABS: 1.0000 | ORAL_TABLET | Freq: Two times a day (BID) | ORAL | 0 refills | Status: DC

## 2020-06-23 MED ORDER — OXYCODONE-ACETAMINOPHEN 5-325 MG PO TABS: 1.0000 | ORAL_TABLET | ORAL | 0 refills | Status: DC | PRN

## 2020-06-23 NOTE — Progress Notes (Signed)
Office Visit Note   Patient: [name].           Date of Birth: 1956-07-12           MRN: [id number] Visit Date: 06/23/2020              Requested by: [name], MD [address] [place],  [place] [address] PCP: [name], MD  Chief Complaint  Patient presents with  . Left Foot - Routine Post Op      HPI: Patient is 3 weeks status post left transmetatarsal amputation.  He has been using the nitroglycerin patch.  He has been changing the dressing the best he can.  He has been keeping it covered and trying to offload on his left foot.  He is out of antibiotics.  Assessment & Plan: Visit Diagnoses: No diagnosis found.  Plan: Patient will continue with nitroglycerin patch and [place] and I will renew his Bactrim.  Follow-up in 1 week.  Emphasized the importance of elevating his foot  Follow-Up Instructions: No follow-ups on file.   Ortho Exam  Patient is alert, oriented, no adenopathy, well-dressed, normal affect, normal respiratory effort. Focused examination he has a palpable dorsalis pedis pulse nitroglycerin patch is in place he does have some wound dehiscence especially on the periphery of the wound.  There is a mild foul odor.  No necrosis.  No signs of cellulitis  Imaging: No results found. No images are attached to the encounter.  Labs: Lab Results  Component Value Date   HGBA1C 6.0 (H) 08/23/2017   HGBA1C 5.3 03/12/2014   HGBA1C 5.4 07/04/2012   ESRSEDRATE 14 06/14/2017   ESRSEDRATE 30 05/17/2017   ESRSEDRATE 23 (H) 01/31/2015   CRP 23.4 (H) 06/14/2017   CRP 37.4 (H) 05/17/2017   CRP 55.8 (H) 03/29/2017   REPTSTATUS 09/17/2010 FINAL 08/03/2010   GRAMSTAIN  08/02/2010    FEW WBC PRESENT, PREDOMINANTLY PMN NO SQUAMOUS EPITHELIAL CELLS SEEN RARE GRAM NEGATIVE RODS   CULT NO ACID FAST BACILLI ISOLATED IN 6 WEEKS 08/03/2010     Lab Results  Component Value Date   ALBUMIN 4.0 03/12/2014   ALBUMIN 3.0 (L) 07/04/2012   ALBUMIN 1.7 (L)  08/02/2010   PREALBUMIN 4.3 (L) 08/02/2010    Lab Results  Component Value Date   MG 2.4 07/04/2012   MG 2.2 08/01/2010   No results found for: VD25OH  Lab Results  Component Value Date   PREALBUMIN 4.3 (L) 08/02/2010   CBC EXTENDED Latest Ref Rng & Units 05/30/2020 01/02/2018 01/01/2018  WBC 4.0 - 10.5 K/uL 9.0 12.4(H) 15.1(H)  RBC 4.22 - 5.81 MIL/uL 4.67 4.16(L) 4.09(L)  HGB 13.0 - 17.0 g/dL 13.6 12.5(L) 12.4(L)  HCT 39 - 52 % 43.3 38.4(L) 37.6(L)  PLT 150 - 400 K/uL 445(H) 223 232  NEUTROABS 1 - 7 x10E3/uL - - -  LYMPHSABS 0 - 3 x10E3/uL - - -     There is no height or weight on file to calculate BMI.  Orders:  No orders of the defined types were placed in this encounter.  Meds ordered this encounter  Medications  . oxyCODONE-acetaminophen (PERCOCET/ROXICET) 5-325 MG tablet    Sig: Take 1 tablet by mouth every 4 (four) hours as needed for severe pain.    Dispense:  30 tablet    Refill:  0  . sulfamethoxazole-trimethoprim (BACTRIM DS) 800-160 MG tablet    Sig: Take 1 tablet by mouth 2 (two) times daily.    Dispense:  20 tablet    Refill:  0     Procedures: No procedures performed  Clinical Data: No additional findings.  ROS:  All other systems negative, except as noted in the HPI. Review of Systems  Objective: Vital Signs: There were no vitals taken for this visit.  Specialty Comments:  No specialty comments available.  PMFS History: Patient Active Problem List   Diagnosis Date Noted  . Abscess of left foot 05/30/2020  . Subacute osteomyelitis, left ankle and foot ([place])   . Skin ulcers of both feet ([place]) 10/24/2017  . Colovesical fistula   . Benign neoplasm of cecum   . Chronic venous insufficiency 07/20/2017  . Chronic left-sided low back pain without sciatica 11/08/2016  . Toe ulcer ([place]) 03/08/2016  . Verruca 12/28/2015  . Pre-ulcerative calluses 12/28/2015  . History of adenomatous polyp of colon 06/09/2015  . Lipoma 03/13/2014  . Plantar  warts 07/09/2012  . Cellulitis 07/04/2012  . Neuropathy 07/04/2012  . Hyperglycemia 07/04/2012  . HYPERTENSION, BENIGN 10/20/2010  . EDEMA 08/20/2010  . WISDOM TEETH EXTRACTION, HX OF 08/20/2010  . HYPOALBUMINEMIA 08/19/2010  . ANEMIA 08/19/2010  . NICOTINE ADDICTION 08/19/2010  . NECROTIZING PNEUMONIA 08/19/2010  . OSTEOARTHRITIS 08/19/2010   Past Medical History:  Diagnosis Date  . Arthritis   . Chronic kidney disease    mass r kidney - partial nephrectomy  . COPD (chronic obstructive pulmonary disease) ([place])   . Diverticulitis   . History of kidney stones   . Hx of adenomatous colonic polyps 06/09/2015  . Hyperlipidemia   . Hypertension    has previously been on medication but then taken off - 05/29/20 pt states he's never been treated for HTN  . Neuromuscular disorder (H[facility])    neuropathy lower legs and feet  . Neuropathy    in both feet  . Pneumonia 2011    Family History  Problem Relation Age of Onset  . Heart disease Mother   . Esophageal cancer Mother   . Diabetes Father   . Pancreatic cancer Father   . Breast cancer Sister   . Colon cancer Neg Hx   . Rectal cancer Neg Hx   . Stomach cancer Neg Hx   . Colon polyps Neg Hx     Past Surgical History:  Procedure Laterality Date  . AMPUTATION Bilateral 06/05/2013   Procedure: RIGHT GREAT TOE AMPUTATION/LEFT GREAT TOE DEBRIDEMENT;  Surgeon: Wylene Simmer, MD;  Location: [facility];  Service: Orthopedics;  Laterality: Bilateral;  . AMPUTATION Left 05/30/2020   Procedure: LEFT TRANSMETATARSAL AMPUTATION;  Surgeon: Newt Minion, MD;  Location: [facility];  Service: Orthopedics;  Laterality: Left;  . APPLICATION OF WOUND VAC Left 05/30/2020   Procedure: APPLICATION OF WOUND VAC;  Surgeon: Newt Minion, MD;  Location: [facility];  Service: Orthopedics;  Laterality: Left;  . COLON SURGERY     robotic partial colectomy Dr. Marcello Moores 12-30-17  . COLONOSCOPY     x 2 last date 09/21/2017 bladder attached to colon eval  . COLONOSCOPY WITH PROPOFOL  N/A 09/21/2017   Procedure: COLONOSCOPY WITH PROPOFOL;  Surgeon: [name], MD;  Location: [facility];  Service: Endoscopy;  Laterality: N/A;  . MOUTH SURGERY    . PARTIAL NEPHRECTOMY     right Dr. Leighton Ruff 5-0[birth year]53  . ROBOTIC ASSITED PARTIAL NEPHRECTOMY Right 12/30/2017   Procedure: XI ROBOTIC ASSITED RIGHT PARTIAL NEPHRECTOMY;  Surgeon: Alexis Frock, MD;  Location: [facility];  Service: Urology;  Laterality: Right;  . TOE AMPUTATION  Right 06/05/2013   RIGHT GREAT TOE PARTIAL AMPUTATION    . TOOTH EXTRACTION  2011   multiple teeth extracted   Social History   Occupational History  . Occupation: retired  Tobacco Use  . Smoking status: Current Some Day Smoker    Packs/day: 0.10    Years: 20.00    Pack years: 2.00    Types: Cigarettes    Start date: 11/29/1973  . Smokeless tobacco: Never Used  . Tobacco comment: occasional smoker - ~1 pack/week. smoked ~1.5ppd until 5 yr ago  Vaping Use  . Vaping Use: Never used  Substance and Sexual Activity  . Alcohol use: No    Alcohol/week: 0.0 standard drinks  . Drug use: No  . Sexual activity: Not Currently    Partners: Female

## 2020-06-24 DIAGNOSIS — L97526 Non-pressure chronic ulcer of other part of left foot with bone involvement without evidence of necrosis: Secondary | ICD-10-CM | POA: Diagnosis not present

## 2020-06-24 DIAGNOSIS — N189 Chronic kidney disease, unspecified: Secondary | ICD-10-CM | POA: Diagnosis not present

## 2020-06-24 DIAGNOSIS — Z4781 Encounter for orthopedic aftercare following surgical amputation: Secondary | ICD-10-CM | POA: Diagnosis not present

## 2020-06-24 DIAGNOSIS — M86672 Other chronic osteomyelitis, left ankle and foot: Secondary | ICD-10-CM | POA: Diagnosis not present

## 2020-06-24 DIAGNOSIS — J449 Chronic obstructive pulmonary disease, unspecified: Secondary | ICD-10-CM | POA: Diagnosis not present

## 2020-06-24 DIAGNOSIS — Z4801 Encounter for change or removal of surgical wound dressing: Secondary | ICD-10-CM | POA: Diagnosis not present

## 2020-06-24 DIAGNOSIS — G629 Polyneuropathy, unspecified: Secondary | ICD-10-CM | POA: Diagnosis not present

## 2020-06-24 DIAGNOSIS — I129 Hypertensive chronic kidney disease with stage 1 through stage 4 chronic kidney disease, or unspecified chronic kidney disease: Secondary | ICD-10-CM | POA: Diagnosis not present

## 2020-06-24 DIAGNOSIS — K5792 Diverticulitis of intestine, part unspecified, without perforation or abscess without bleeding: Secondary | ICD-10-CM | POA: Diagnosis not present

## 2020-06-25 DIAGNOSIS — K5792 Diverticulitis of intestine, part unspecified, without perforation or abscess without bleeding: Secondary | ICD-10-CM | POA: Diagnosis not present

## 2020-06-25 DIAGNOSIS — I129 Hypertensive chronic kidney disease with stage 1 through stage 4 chronic kidney disease, or unspecified chronic kidney disease: Secondary | ICD-10-CM | POA: Diagnosis not present

## 2020-06-25 DIAGNOSIS — N189 Chronic kidney disease, unspecified: Secondary | ICD-10-CM | POA: Diagnosis not present

## 2020-06-25 DIAGNOSIS — G629 Polyneuropathy, unspecified: Secondary | ICD-10-CM | POA: Diagnosis not present

## 2020-06-25 DIAGNOSIS — Z4801 Encounter for change or removal of surgical wound dressing: Secondary | ICD-10-CM | POA: Diagnosis not present

## 2020-06-25 DIAGNOSIS — L97526 Non-pressure chronic ulcer of other part of left foot with bone involvement without evidence of necrosis: Secondary | ICD-10-CM | POA: Diagnosis not present

## 2020-06-25 DIAGNOSIS — Z4781 Encounter for orthopedic aftercare following surgical amputation: Secondary | ICD-10-CM | POA: Diagnosis not present

## 2020-06-25 DIAGNOSIS — M86672 Other chronic osteomyelitis, left ankle and foot: Secondary | ICD-10-CM | POA: Diagnosis not present

## 2020-06-25 DIAGNOSIS — J449 Chronic obstructive pulmonary disease, unspecified: Secondary | ICD-10-CM | POA: Diagnosis not present

## 2020-06-30 ENCOUNTER — Ambulatory Visit (INDEPENDENT_AMBULATORY_CARE_PROVIDER_SITE_OTHER): Payer: Medicare PPO | Admitting: Physician Assistant

## 2020-06-30 ENCOUNTER — Encounter: Payer: Self-pay | Admitting: Physician Assistant

## 2020-06-30 VITALS — Ht >= 80 in | Wt 369.0 lb

## 2020-06-30 DIAGNOSIS — M86272 Subacute osteomyelitis, left ankle and foot: Secondary | ICD-10-CM

## 2020-06-30 MED ORDER — OXYCODONE-ACETAMINOPHEN 5-325 MG PO TABS: 1.0000 | ORAL_TABLET | ORAL | 0 refills | Status: DC | PRN

## 2020-06-30 NOTE — Progress Notes (Signed)
Office Visit Note   Patient: [name].           Date of Birth: Apr 20, 1956           MRN: [id number] Visit Date: 06/30/2020              Requested by: [name], MD [address] [place],  [place] [address] PCP: [name], MD  Chief Complaint  Patient presents with  . Left Foot - Follow-up    Left transmetatarsal amputation 05/30/2020      HPI: This is a pleasant 64 year old gentleman who is 4 weeks status post left transmetatarsal amputation.  He has been on antibiotics and using a nitroglycerin patch.  He uses half a patch because he began to develop some symptoms from using a whole patch  Assessment & Plan: Visit Diagnoses: No diagnosis found.  Plan: I think he is slowly improving.  He should continue with the patches and antibiotics.  Continue daily washing with Dial soap and water.  Follow-up in 1 week.  Follow-Up Instructions: No follow-ups on file.   Ortho Exam  Patient is alert, oriented, no adenopathy, well-dressed, normal affect, normal respiratory effort. He has significant eschar around the wound dehiscence.  He has wound dehiscence that is approximately 3 cm at its widest.  No ascending cellulitis no foul odor he does have some thick superficial eschar forming.  No signs of acute infection mild to moderate soft tissue swelling  Imaging: No results found. No images are attached to the encounter.  Labs: Lab Results  Component Value Date   HGBA1C 6.0 (H) 08/23/2017   HGBA1C 5.3 03/12/2014   HGBA1C 5.4 07/04/2012   ESRSEDRATE 14 06/14/2017   ESRSEDRATE 30 05/17/2017   ESRSEDRATE 23 (H) 01/31/2015   CRP 23.4 (H) 06/14/2017   CRP 37.4 (H) 05/17/2017   CRP 55.8 (H) 03/29/2017   REPTSTATUS 09/17/2010 FINAL 08/03/2010   GRAMSTAIN  08/02/2010    FEW WBC PRESENT, PREDOMINANTLY PMN NO SQUAMOUS EPITHELIAL CELLS SEEN RARE GRAM NEGATIVE RODS   CULT NO ACID FAST BACILLI ISOLATED IN 6 WEEKS 08/03/2010     Lab Results  Component Value Date     ALBUMIN 4.0 03/12/2014   ALBUMIN 3.0 (L) 07/04/2012   ALBUMIN 1.7 (L) 08/02/2010   PREALBUMIN 4.3 (L) 08/02/2010    Lab Results  Component Value Date   MG 2.4 07/04/2012   MG 2.2 08/01/2010   No results found for: VD25OH  Lab Results  Component Value Date   PREALBUMIN 4.3 (L) 08/02/2010   CBC EXTENDED Latest Ref Rng & Units 05/30/2020 01/02/2018 01/01/2018  WBC 4.0 - 10.5 K/uL 9.0 12.4(H) 15.1(H)  RBC 4.22 - 5.81 MIL/uL 4.67 4.16(L) 4.09(L)  HGB 13.0 - 17.0 g/dL 13.6 12.5(L) 12.4(L)  HCT 39 - 52 % 43.3 38.4(L) 37.6(L)  PLT 150 - 400 K/uL 445(H) 223 232  NEUTROABS 1 - 7 x10E3/uL - - -  LYMPHSABS 0 - 3 x10E3/uL - - -     Body mass index is 40.54 kg/m.  Orders:  No orders of the defined types were placed in this encounter.  Meds ordered this encounter  Medications  . oxyCODONE-acetaminophen (PERCOCET/ROXICET) 5-325 MG tablet    Sig: Take 1 tablet by mouth every 4 (four) hours as needed for severe pain.    Dispense:  30 tablet    Refill:  0     Procedures: No procedures performed  Clinical Data: No additional findings.  ROS:  All other systems negative,  except as noted in the HPI. Review of Systems  Objective: Vital Signs: Ht 6\' 8"  (2.032 m)   Wt (!) 369 lb (167.4 kg)   BMI 40.54 kg/m   Specialty Comments:  No specialty comments available.  PMFS History: Patient Active Problem List   Diagnosis Date Noted  . Abscess of left foot 05/30/2020  . Subacute osteomyelitis, left ankle and foot ([facility])   . Skin ulcers of both feet ([place]) 10/24/2017  . Colovesical fistula   . Benign neoplasm of cecum   . Chronic venous insufficiency 07/20/2017  . Chronic left-sided low back pain without sciatica 11/08/2016  . Toe ulcer ([place]) 03/08/2016  . Verruca 12/28/2015  . Pre-ulcerative calluses 12/28/2015  . History of adenomatous polyp of colon 06/09/2015  . Lipoma 03/13/2014  . Plantar warts 07/09/2012  . Cellulitis 07/04/2012  . Neuropathy 07/04/2012  .  Hyperglycemia 07/04/2012  . HYPERTENSION, BENIGN 10/20/2010  . EDEMA 08/20/2010  . WISDOM TEETH EXTRACTION, HX OF 08/20/2010  . HYPOALBUMINEMIA 08/19/2010  . ANEMIA 08/19/2010  . NICOTINE ADDICTION 08/19/2010  . NECROTIZING PNEUMONIA 08/19/2010  . OSTEOARTHRITIS 08/19/2010   Past Medical History:  Diagnosis Date  . Arthritis   . Chronic kidney disease    mass r kidney - partial nephrectomy  . COPD (chronic obstructive pulmonary disease) ([facility])   . Diverticulitis   . History of kidney stones   . Hx of adenomatous colonic polyps 06/09/2015  . Hyperlipidemia   . Hypertension    has previously been on medication but then taken off - 05/29/20 pt states he's never been treated for HTN  . Neuromuscular disorder ([facility])    neuropathy lower legs and feet  . Neuropathy    in both feet  . Pneumonia 2011    Family History  Problem Relation Age of Onset  . Heart disease Mother   . Esophageal cancer Mother   . Diabetes Father   . Pancreatic cancer Father   . Breast cancer Sister   . Colon cancer Neg Hx   . Rectal cancer Neg Hx   . Stomach cancer Neg Hx   . Colon polyps Neg Hx     Past Surgical History:  Procedure Laterality Date  . AMPUTATION Bilateral 06/05/2013   Procedure: RIGHT GREAT TOE AMPUTATION/LEFT GREAT TOE DEBRIDEMENT;  Surgeon: Wylene Simmer, MD;  Location: [facility];  Service: Orthopedics;  Laterality: Bilateral;  . AMPUTATION Left 05/30/2020   Procedure: LEFT TRANSMETATARSAL AMPUTATION;  Surgeon: Newt Minion, MD;  Location: [facility];  Service: Orthopedics;  Laterality: Left;  . APPLICATION OF WOUND VAC Left 05/30/2020   Procedure: APPLICATION OF WOUND VAC;  Surgeon: Newt Minion, MD;  Location: [facility];  Service: Orthopedics;  Laterality: Left;  . COLON SURGERY     robotic partial colectomy Dr. Marcello Moores 12-30-17  . COLONOSCOPY     x 2 last date 09/21/2017 bladder attached to colon eval  . COLONOSCOPY WITH PROPOFOL N/A 09/21/2017   Procedure: COLONOSCOPY WITH PROPOFOL;  Surgeon:  [name], MD;  Location: [facility];  Service: Endoscopy;  Laterality: N/A;  . MOUTH SURGERY    . PARTIAL NEPHRECTOMY     right Dr. Leighton Ruff 0-8[birth year]65  . ROBOTIC ASSITED PARTIAL NEPHRECTOMY Right 12/30/2017   Procedure: XI ROBOTIC ASSITED RIGHT PARTIAL NEPHRECTOMY;  Surgeon: Alexis Frock, MD;  Location: [facility];  Service: Urology;  Laterality: Right;  . TOE AMPUTATION Right 06/05/2013   RIGHT GREAT TOE PARTIAL AMPUTATION    . TOOTH EXTRACTION  2011  multiple teeth extracted   Social History   Occupational History  . Occupation: retired  Tobacco Use  . Smoking status: Current Some Day Smoker    Packs/day: 0.10    Years: 20.00    Pack years: 2.00    Types: Cigarettes    Start date: 11/29/1973  . Smokeless tobacco: Never Used  . Tobacco comment: occasional smoker - ~1 pack/week. smoked ~1.5ppd until 5 yr ago  Vaping Use  . Vaping Use: Never used  Substance and Sexual Activity  . Alcohol use: No    Alcohol/week: 0.0 standard drinks  . Drug use: No  . Sexual activity: Not Currently    Partners: Female

## 2020-07-01 DIAGNOSIS — Z4781 Encounter for orthopedic aftercare following surgical amputation: Secondary | ICD-10-CM | POA: Diagnosis not present

## 2020-07-01 DIAGNOSIS — G629 Polyneuropathy, unspecified: Secondary | ICD-10-CM | POA: Diagnosis not present

## 2020-07-01 DIAGNOSIS — Z4801 Encounter for change or removal of surgical wound dressing: Secondary | ICD-10-CM | POA: Diagnosis not present

## 2020-07-01 DIAGNOSIS — L97526 Non-pressure chronic ulcer of other part of left foot with bone involvement without evidence of necrosis: Secondary | ICD-10-CM | POA: Diagnosis not present

## 2020-07-01 DIAGNOSIS — M86672 Other chronic osteomyelitis, left ankle and foot: Secondary | ICD-10-CM | POA: Diagnosis not present

## 2020-07-01 DIAGNOSIS — N189 Chronic kidney disease, unspecified: Secondary | ICD-10-CM | POA: Diagnosis not present

## 2020-07-01 DIAGNOSIS — I129 Hypertensive chronic kidney disease with stage 1 through stage 4 chronic kidney disease, or unspecified chronic kidney disease: Secondary | ICD-10-CM | POA: Diagnosis not present

## 2020-07-01 DIAGNOSIS — K5792 Diverticulitis of intestine, part unspecified, without perforation or abscess without bleeding: Secondary | ICD-10-CM | POA: Diagnosis not present

## 2020-07-01 DIAGNOSIS — J449 Chronic obstructive pulmonary disease, unspecified: Secondary | ICD-10-CM | POA: Diagnosis not present

## 2020-07-03 DIAGNOSIS — J449 Chronic obstructive pulmonary disease, unspecified: Secondary | ICD-10-CM | POA: Diagnosis not present

## 2020-07-03 DIAGNOSIS — M86672 Other chronic osteomyelitis, left ankle and foot: Secondary | ICD-10-CM | POA: Diagnosis not present

## 2020-07-03 DIAGNOSIS — I129 Hypertensive chronic kidney disease with stage 1 through stage 4 chronic kidney disease, or unspecified chronic kidney disease: Secondary | ICD-10-CM | POA: Diagnosis not present

## 2020-07-03 DIAGNOSIS — K5792 Diverticulitis of intestine, part unspecified, without perforation or abscess without bleeding: Secondary | ICD-10-CM | POA: Diagnosis not present

## 2020-07-03 DIAGNOSIS — N189 Chronic kidney disease, unspecified: Secondary | ICD-10-CM | POA: Diagnosis not present

## 2020-07-03 DIAGNOSIS — L97526 Non-pressure chronic ulcer of other part of left foot with bone involvement without evidence of necrosis: Secondary | ICD-10-CM | POA: Diagnosis not present

## 2020-07-03 DIAGNOSIS — Z4801 Encounter for change or removal of surgical wound dressing: Secondary | ICD-10-CM | POA: Diagnosis not present

## 2020-07-03 DIAGNOSIS — Z4781 Encounter for orthopedic aftercare following surgical amputation: Secondary | ICD-10-CM | POA: Diagnosis not present

## 2020-07-03 DIAGNOSIS — G629 Polyneuropathy, unspecified: Secondary | ICD-10-CM | POA: Diagnosis not present

## 2020-07-07 ENCOUNTER — Encounter (HOSPITAL_BASED_OUTPATIENT_CLINIC_OR_DEPARTMENT_OTHER): Payer: Medicare PPO | Attending: Internal Medicine | Admitting: Internal Medicine

## 2020-07-07 DIAGNOSIS — L97518 Non-pressure chronic ulcer of other part of right foot with other specified severity: Secondary | ICD-10-CM | POA: Insufficient documentation

## 2020-07-07 DIAGNOSIS — G9009 Other idiopathic peripheral autonomic neuropathy: Secondary | ICD-10-CM | POA: Insufficient documentation

## 2020-07-07 DIAGNOSIS — G629 Polyneuropathy, unspecified: Secondary | ICD-10-CM | POA: Insufficient documentation

## 2020-07-07 NOTE — Progress Notes (Signed)
THAINE, GARRIGA ([id number]) Visit Report for 07/07/2020 Debridement Details Patient Name: Date of Service: [name], [name] 07/07/2020 9:30 A M Medical Record Number: [id number] Patient Account Number: [id number] Date of Birth/Sex: Treating RN: 12-16-1955 (64 y.o. [name] Primary Care Provider: [name] Other Clinician: Referring Provider: Treating Provider/Extender: Sallyanne Kuster Weeks in Treatment: 2 Debridement Performed for Assessment: Wound #13 Right T Second oe Performed By: Physician Tobi Bastos, MD Debridement Type: Debridement Level of Consciousness (Pre-procedure): Awake and Alert Pre-procedure Verification/Time Out Yes - 09:46 Taken: Start Time: 09:46 T Area Debrided (L x W): otal 0.3 (cm) x 1.5 (cm) = 0.45 (cm) Tissue and other material debrided: Non-Viable, Callus Level: Non-Viable Tissue Debridement Description: Selective/Open Wound Instrument: Curette Bleeding: Minimum Hemostasis Achieved: Pressure End Time: 09:47 Procedural Pain: 0 Post Procedural Pain: 0 Response to Treatment: Procedure was tolerated well Level of Consciousness (Post- Awake and Alert procedure): Post Debridement Measurements of Total Wound Length: (cm) 0.3 Width: (cm) 1.5 Depth: (cm) 0.4 Volume: (cm) 0.141 Character of Wound/Ulcer Post Debridement: Improved Post Procedure Diagnosis Same as Pre-procedure Electronic Signature(s) Signed: 07/07/2020 4:24:36 PM By: Tobi Bastos MD, MBA Signed: 07/07/2020 5:19:36 PM By: Levan Hurst RN, BSN Entered By: Levan Hurst on 07/07/2020 09:47:43 -------------------------------------------------------------------------------- HPI Details Patient Name: Date of Service: [name], [name] 07/07/2020 9:30 A M Medical Record Number: [id number] Patient Account Number: [id number] Date of Birth/Sex: Treating RN: [birth year] (64 y.o. [name] Primary Care Provider: [name] Other  Clinician: Referring Provider: Treating Provider/Extender: Sallyanne Kuster Weeks in Treatment: 2 History of Present Illness Location: right first metatarsal head on the plantar aspect and the left big toe and the right fifth toe Quality: Patient reports No Pain. Severity: Patient states wound(s) are getting worse. Duration: Patient has had the wound for > 24 months prior to seeking treatment at the wound center Context: The wound would happen gradually Modifying Factors: Wound improving due to current treatment. he has been under podiatry care for over a year and a half ssociated Signs and Symptoms: Patient reports having: heaviness in both legs A HPI Description: This 64 year old gentleman who has had a long-standing neuropathy of both feet without history of diabetes mellitus has never been worked up for his neurological problem in the last 5 years or so. Most recently he has been seen by the podiatrist Dr. [name] who was been seeing them for about a year and a half and from what I understand has been treating him for bilateral ulceration on the feet, the right being in the region of the first metatarsal head plantar aspect and the left being in the area of the left big toe. He's had various antibiotics including Levaquin recently and this was then switched to Cipro and clindamycin. Due to the nature of this wound which has been there for a long while he has been recently referred to [name] for an opinion. An MRI was done in the middle of June which showed several small abscesses near the amputation site of the right great toe with draining and open wounds with surrounding cellulitis but no evidence of septic arthritis, osteomyelitis or pyomyositis. I understand at this stage he was taken to the operating room by [name] who debrided the wound and cleaned out the abscess. I understand ABIs were done bilaterally and they were within normal limits at Dr. [name]  office. The patient is a smoker and is working on quitting smoking. 07/13/2017 --  had a lower extremity arterial duplex evaluation which showed patent bilateral lower extremity arteries without evidence of hemodynamically significant stenosis. His right ABI was 1.05 and the left was 0.95 He had a lower extremity venous duplex reflux evaluation done which showed significant venous incompetence in the bilateral common femoral, saphenofemoral junction, small saphenous vein and in the right great saphenous vein. With these findings I believe he will benefit from a consultation with the vascular surgeon regarding the endovenous ablation procedure. the patient's neurology appointment and dermatology appointment is still pending. He has had a x-ray done by his PCP and the results are not available. However he did see [name] who I understand has ordered a bilateral MRI of his feet. 07/26/2017 -- was seen by [name] on 07/20/2017. His impression was that he presented with minimal bilateral lower extremity peripheral arterial disease, bilateral lower extremity chronic venous insufficiency (C4) and radiculopathy versus neuropathy bilateral lower extremity. No arterial intervention was needed at the present time. He recommended maximal medical management at this stage for his atherosclerotic disease . He recommended compression to be continued and no venous intervention at the present time. He was offered follow-up in the Vein clinic in 3 months time. MR of the right foot done with and without contrast -- IMPRESSION:Wound on the plantar surface of the foot at approximately the level of first MTP joint with a tiny underlying soft tissue abscess. Large area of soft tissue edema subjacent to the wound is most compatible with granulation tissue. Negative for osteomyelitis about the first MTP joint. Mild edema and enhancement in the proximal phalanx of the little toe is stable compared to the  prior examination and may be due to stress change. Infection is possible but thought highly unlikely. Subcutaneous edema over the dorsum of the foot compatible with dependent change/cellulitis. the patient also has had a abdominal CT which shows a kidney mass and is going to have her MRI for this. He also has a urology opinion pending and a general surgeon's opinion regarding a colonic mass. 08/02/2017 -- his appointments with his other urology and surgical consultants is still later. He has an MRI of the left foot later this week. 08/09/2017 --MR of the left foot with and without contrast -- IMPRESSION:Large appearing skin wound on the medial aspect of the great toe without underlying abscess or osteomyelitis.First MTP osteoarthritis. Marked marrow edema in the medial sesamoid bone is likely related to osteoarthritis but could be due to sesamoiditis. 08/16/2017 -- the patient has had a surgical opinion and a urological opinion and a extensive surgery including possible colon resection, bladder surgery and kidney surgery is being planned. No date has been set for his procedure He continues to be off smoking 08/31/2017 -- he was seen by the neurologist for his multiple problems of neuropathic and radicular symptoms in both legs with a thorough workup done. MRIs were reviewed lab work was reviewed and the assessment was that he had severe neuropathy due to multifactorial reasons and a neuropathy panel of lab work was ordered and he would be reviewed back. Patient is also due for colonoscopy to be repeated before his bladder and kidney surgery. At the present time he would like [name] to continue with conservative and symptomatic wound care for both his feet. 10/05/2017 -- since I have seen him last time he has had a colonoscopy which was fairly normal except for a polyp and he has a urology appointment pending later today. He still continues to stay  off smoking. 10/26/2017-- he returns after about 3  weeks and in the meanwhile he was seen by Dr. Kellie Simmering, who reviewed his venous studies and noted that there was very minimal enlargement of the bilateral great saphenous vein on the right and somewhat larger vein on the left but no consistent reflux throughout the great saphenous veins and there was also some right small saphenous veins which were enlarged with some reflux. However overall he thought that venous insufficiency was not significantly the cause of his ulceration and it was more of trophic ulcers and hence he recommended aggressive wound care, and at some stage he may need amputation of the left first toe and the right fifth toe. 11/09/2017 -- due to the inclement weather he has missed his urology appointment and is still awaiting the rescheduled one. Other than that he's been doing fairly well. 11/23/2017 -- he has had his cystoscopy done by his urologist and they found multiple areas of concern and he is going to be set up for a procedure sometime later in January. He is on complex care as far as his wounds go and seizures every 2-3 weeks 12/07/17 patient presents today for evaluation concerning his right great toe amputation site as well as his left great toe ulcer. He has been tolerating the dressing changes fairly well. With that being said there does not appear to be any significant discomfort at this point that he is experiencing although he has quite significant callous especially in regard to the amputation site of the right great toe. Nonetheless he has no evidence of infection which is good news No fevers, chills, nausea, or vomiting noted at this time. He has no pain. READMISSION 05/09/18; is a patient who hasn't been here in quite some time. He is not a diabetic but he has severe idiopathic peripheral neuropathy. He was here for review of a nonhealing wound on the right great toe amputation site and a large portion of the tip of the left great toe. Sometime after his last visit  here he became ill he had abdominal issues infections and required hospitalizations. He largely dropped off the map. He is putting silver alginate on the wounds. I note that he had arterial studies and saw Dr. Bridgett Larsson of vascular surgery was not felt to have an arterial issue. He also had reflux studies and he wears compression stockings and he is faithful with these. He had MRI of both of his feet that not show osteomyelitis question small abscess on the right. He tells me that the nonhealing wound on his right great toe amputation site is been there since the actual amputation which was in 2014 I note the area was aggressively debrided by Dr. Jacqualyn Posey of podiatry before he started coming to this clinic apparently there were abscess is present at that time. His last formal arterial studies were done in August 2018 which time his ABI was 1.05 on the right and 0.95 on the left he had biphasic and triphasic waveforms on the left and monophasic and biphasic waveforms on the right. He did not have TBI's. ABIs in our clinic today were 0.8 bilaterally. The patient states he is not a diabetic. He does have idiopathic peripheral neuropathy. He is a smoker but is trying to quit with Chantix. 05/16/18; x-rays of his bilateral feet were negative for osteomyelitis. He was noted to have a chronic deformity of the head of the second metatarsal which may be due to previous osteonecrosis there was no definite underlying  bone destruction to suggest osteomyelitis on either side. He came in with the extensive callus over his right great toe amputation site and the left great toe did extensive debridement last week and another extensive debridement today.patient is changing his dressing himself with silver alginate 05/30/18; still buildup of thick callus covering thick subcutaneous tissue around both of these areas. This requires an extensive debridement on both sides. I have discussed offloading this area with the patient. I  think he inverts when he walks. His foot sizes beyond what we can put in a Darco forefoot off loader or probably what we can put in a total contact cast. 06/13/18; still a remarkable buildup of callus over the right first toe amputation site. The left first toe has 2 open wounds one medially and 1 at the tip of the toe. Surrounding callus is also not back here. We've been using silver alginate. He has a darco forefoot off loader now on the right foot.his own shoe on the left foot. Would like to try a total contact cast on the right. He is making arrangements for someone to drive him here in 2 weeks. 06/29/18; still a buildup of callus over the right first toe amputation site although not as significant as previously. The left first toe has 2 open wounds one medially and one at the tip surrounding callus as well. We've been using silver alginate. He comes in today with the right for a total contact cast which I'll use on the right 07/04/18 on evaluation today patient appears to be doing decently well in regard to the cast. Unfortunately he did have a little area that rubbed on the lateral aspect of his leg the cast actually cracked on the medial aspect this happened he states just yesterday. He has not been walking in the cast without the boot portion on. With that being said he states that he is unsure of exactly what happened with the crack. Nonetheless this does seem to have caused a little bit of rubbing on the lateral aspect I think due to the integrity of the cast being compromised. Nonetheless I advised him that if this happens in the future he needs to let us know soon as possible. Nonetheless I do feel like the cast has been of benefit some of the area on the distal portion of his great toe amputation site actually appears to be doing better. 07/12/18 on evaluation today patient appears to be doing very well in regard to his right great toe invitation site. This seems to be showing signs of  excellent improvement which is great news. There does not appear to be any evidence of infection at this time. With that being said he has been tolerating the dressing changes without complication. There appears to be no evidence of infection and again this is barely open at this point. The total contact cast has been of excellent benefit for him. I believe this will likely be completely closed and ready next week that we can switch to a cast on the left foot. He states he may want to take a week's break. 07/19/18 on evaluation today patient appears to be doing better in regard to both ulceration areas. The right foot actually appears to be completely healed he still has some callous but no open wound. The left foot/great toe actually seems to be better after last week's debridement overall the appearance of the wound is greatly improved. 08/09/18 on evaluation today patient actually appears to be doing a little  bit worse in regard to the right metatarsal invitation site. He's been tolerating the dressing changes without complication. With that being said he does note that the area actually cracked open as of today when he was in the shower trying to scrub it. Nonetheless it does appear that the Caryn Section that previously was open and draining has subsequently reopened and is draining again. Nonetheless as I was looking at him and performing the debridement today I did inquire about whether or not we had ever biopsy the area as I was concerned about the possibility for something along the lines of a work type virus/infection causing the excessive callous buildup. Especially since he seems to grow much more than what would just be traditionally expected by friction alone. He subsequently told me that he did have this biopsied somewhere around 1-2 years ago by Dr. Jacqualyn Posey and that it showed that he had a wart infection at that point. Nonetheless he was never referred to dermatology better Jacqualyn Posey attempted to  cut it out according to the patient. Nonetheless that may be part of the big issue with what we're dealing with here and without treating the wart infection we may not actually get this to heal and stay closed. READMISSION 12/07/2018 The patient is back in clinic today essentially with wounds in the same area and condition is that I think the last 2 times he has been here. This includes the right first metatarsal head in the setting of a previous right great toe amputation and a large part of the medial part of his left great toe. The patient states he has been dealing with this for years dating back to 2005 on and off. That he is never found this to be totally healed. When he was last in this clinic he had this biopsied shave biopsies which showed hyperkeratotic and parakeratotic debris. Epithelium with papillary features negative for malignancy. He has been using silver alginate. He has healing sandals which he uses Felt on the bottom of these. When he was here last time we put him a total contact cast at least for a while and the patient states on the right that helped with the buildup of hyperkeratotic tissue and that seems to be reflective in our notes. He also states that was previously biopsied by Dr. Jacqualyn Posey of podiatry. The patient has a severe idiopathic peripheral neuropathy which he attributes to a brown recluse spider bite in 2002 or so. He is not a diabetic The patient had arterial studies in this showed an ABI in the right of 1.05 on the left was 0.95. TBI's were not done. He had monophasic waveforms at the right posterior tibial biphasic at the dorsalis pedis. On the left he was biphasic at the posterior tibial and triphasic at the dorsalis pedis. 1/17; this is a patient with a very challenging issue was readmitted to the clinic last week. He has a large area over the first right MTP in the setting of a previous right great toe amputation. On presentation this is covered with copious  amounts of hyperkeratotic thick callus nonviable tissue. When this is debridement both on this occasion and previous occasions in this clinic he has a very boggy presentation to the subcutaneous tissue which looks somewhat atypical. He has a similar area on the left great toe from the tip of the toe down the medial aspect beyond the interphalangeal joint. He's had previous MRIs of both of his feet. On the right that showed small abscesses and he  was taken to the OR by podiatry for debridement and cleaning out of the abscesses. An MRI did not show osteomyelitis of the left first toe. Both the MRIs were done in 2018. He saw Dr. Vallarie Mare at the time who did not think he had significant macrovascular disease. He is not a diabetic but he has idiopathic peripheral neuropathy and was a significant smoker. When he was in the clinic earlier in 2019 he had biopsies done of both areas that showed hyperkeratotic material but no malignancy. X-rays I ordered last week did not show osteomyelitis on the left but did suggest osteomyelitis at the tip of the left great toe. I looked at the x-rays myself this is a subtle finding it is only seen on the lateral film. I did a very significant debridement on him last week bilaterally in both the wound areas look better. When he was previously in our clinic the area on the right did improve with a total contact cast. He dropped out of the schedule at that point I'm not exactly sure why 1/24; the biopsy I did of the area over his right first metatarsal head showed the possibility of verruca vulgaris. Superimposed changes of lichen simplex chronicus. If this is a plantar wart this would be extremely large. I think he would need topical destructive therapy and I am going to refer him to dermatology. There was no suggestion of malignancy. Previous biopsies in this clinic on both his areas did not show evidence of malignancy either. Until we get dermatology I am going to continue with  silver alginate. X-rays suggested osteomyelitis of the left toe I am going to continue him on trimethoprim sulfamethoxazole 2/3; not much change in this area on the right first met head. Extensive dark eschar formation around the central area of nonviable subcutaneous tissue looking like a boggy subcutaneous tissue. Using a #10 scalpel and pickups I removed all of this. I have tried this before and this man but I plan to put him in a total contact cast next week Using a #5 curette debridement of the left great toe He has an appointment with dermatology at [facility] on the 25th 2/11; right first metatarsal head after last week's extensive debridement is right back to the way it was with a nonviable subcutaneous tissue and thick surrounding eschar. The left great toe looks about the same. The patient could not have a total contact cast today because he had household problems [leaking roof] but I am not going to put a cast on him until I have information from the dermatologist about this. READMISSION 06/17/2020 Mr. Nair is now a 64 year old man that we have had in this clinic on at least 2 occasions previously. When he was last here in February 2020 I referred him to dermatology at [facility] for their review of large wounds on the right first metatarsal head with surrounding severe chronic hyperkeratosis. This was biopsied on at least 2 occasions in our clinic and I think at [facility] as well that did not show an underlying malignancy. I have reviewed the consult from Dr. Sharlette Dense dermatology from 01/23/2019. At that time the patient had wounds on the plantar right first metatarsal head as well as the plantar left great toe. From what he is saying the left great toe healed however he recently had to have a left TMA by Dr. 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 Dermatology at [facility] apparently referred him  to Dr. Luetta Nutting of plastic surgery and an operative  excision of this area was planned however apparently they ran into the pandemic. The procedure was repetitively cancel and the patient did not keep his final appointment which I think was earlier this year he was also seen in February of this year at the wound care center in [facility] by Dr. Zigmund Daniel. They were applying Silvadene cream and Xeroform which she is essentially doing now. I see in his notes that there was still some concern about underlying malignancy although would be difficult to believe as long as this is been there that there would not be more extensive damage at this point in any case he is back in our clinic for review of this. He had an x-ray of the foot on 01/23/2020 that was negative for osteomyelitis acute fracture. Past medical history; idiopathic peripheral neuropathy, right great toe amputation in 2011, lumbar spinal stenosis, left transmetatarsal amputation on 05/30/2020 by Dr. Sharol Given and more recently apparently a slight wound dehiscence. He also had a fracture of his left tibial plateau sometime in 2020. ABI in our clinic was 0.95 8/9-Patient returns after being established in the clinic last time, he has a new open area on the plantar aspect of his right second toe, the right foot plantar ulcer below the great toe on the right looks slightly smaller, using PolyMem. Patient attends to his surgeon for the left foot wound Electronic Signature(s) Signed: 07/07/2020 9:52:37 AM By: Tobi Bastos MD, MBA Entered By: Tobi Bastos on 07/07/2020 09:52:37 -------------------------------------------------------------------------------- Physical Exam Details Patient Name: Date of Service: [name], [name] 07/07/2020 9:30 A M Medical Record Number: [id number] Patient Account Number: [id number] Date of Birth/Sex: Treating RN: Jan 29, 1956 (64 y.o. [name] Primary Care Provider: [name] Other Clinician: Referring Provider: Treating Provider/Extender: Sallyanne Kuster Weeks in Treatment: 2 Constitutional alert and oriented x 3. sitting or standing blood pressure is within target range for patient.. supine blood pressure is within target range for patient.. pulse regular and within target range for patient.Marland Kitchen respirations regular, non-labored and within target range for patient.Marland Kitchen temperature within target range for patient.. . . Well- nourished and well-hydrated in no acute distress. Notes Thick callus skin below the open area on the plantar second toe right, some of this was removed using scalpel and curette but this is extremely tough to remove Electronic Signature(s) Signed: 07/07/2020 9:53:16 AM By: Tobi Bastos MD, MBA Entered By: Tobi Bastos on 07/07/2020 09:53:16 -------------------------------------------------------------------------------- Physician Orders Details Patient Name: Date of Service: [name], [name] 07/07/2020 9:30 A M Medical Record Number: [id number] Patient Account Number: [id number] Date of Birth/Sex: Treating RN: 05-01-1956 (64 y.o. [name] Primary Care Provider: [name] Other Clinician: Referring Provider: Treating Provider/Extender: Fernande Boyden in Treatment: 2 Verbal / Phone Orders: No Diagnosis Coding ICD-10 Coding Code Description L97.518 Non-pressure chronic ulcer of other part of right foot with other specified severity G90.09 Other idiopathic peripheral autonomic neuropathy Follow-up Appointments Return Appointment in 2 weeks. Dressing Change Frequency Wound #12 Right Metatarsal head first Change Dressing every other day. Wound #13 Right T Second oe Change Dressing every other day. Wound Cleansing Clean wound with Wound Cleanser - or normal saline Primary Wound Dressing Wound #12 Right Metatarsal head first Polymem Silver Wound #13 Right T Second oe Polymem Silver Secondary Dressing Wound #12 Right Metatarsal head  first Kerlix/Rolled Gauze Dry Gauze Other: - felt callous pad or foam donut Wound #  5 Right T Second oe Kerlix/Rolled Gauze Dry Gauze Off-Loading Open toe surgical shoe to: - right foot [facility] nursing for wound care. Alvis Lemmings Electronic Signature(s) Signed: 07/07/2020 4:24:36 PM By: Tobi Bastos MD, MBA Signed: 07/07/2020 5:19:36 PM By: Levan Hurst RN, BSN Entered By: Levan Hurst on 07/07/2020 09:51:29 -------------------------------------------------------------------------------- Problem List Details Patient Name: Date of Service: [name], [name] 07/07/2020 9:30 A M Medical Record Number: [id number] Patient Account Number: [id number] Date of Birth/Sex: Treating RN: 1956-11-15 (64 y.o. [name] Primary Care Provider: [name] Other Clinician: Referring Provider: Treating Provider/Extender: Sallyanne Kuster Weeks in Treatment: 2 Active Problems ICD-10 Encounter Code Description Active Date MDM Diagnosis L97.518 Non-pressure chronic ulcer of other part of right foot with other specified 06/17/2020 No Yes severity G90.09 Other idiopathic peripheral autonomic neuropathy 06/17/2020 No Yes Inactive Problems Resolved Problems Electronic Signature(s) Signed: 07/07/2020 4:24:36 PM By: Tobi Bastos MD, MBA Signed: 07/07/2020 5:19:36 PM By: Levan Hurst RN, BSN Entered By: Levan Hurst on 07/07/2020 09:48:31 -------------------------------------------------------------------------------- Progress Note Details Patient Name: Date of Service: [name], [name] 07/07/2020 9:30 A M Medical Record Number: [id number] Patient Account Number: [id number] Date of Birth/Sex: Treating RN: [birth year] (64 y.o. [name] Primary Care Provider: [name] Other Clinician: Referring Provider: Treating Provider/Extender: Sallyanne Kuster Weeks in Treatment: 2 Subjective History of Present  Illness (HPI) The following HPI elements were documented for the patient's wound: Location: right first metatarsal head on the plantar aspect and the left big toe and the right fifth toe Quality: Patient reports No Pain. Severity: Patient states wound(s) are getting worse. Duration: Patient has had the wound for > 24 months prior to seeking treatment at the wound center Context: The wound would happen gradually Modifying Factors: Wound improving due to current treatment. he has been under podiatry care for over a year and a half Associated Signs and Symptoms: Patient reports having: heaviness in both legs This 64 year old gentleman who has had a long-standing neuropathy of both feet without history of diabetes mellitus has never been worked up for his neurological problem in the last 5 years or so. Most recently he has been seen by the podiatrist Dr. [name] who was been seeing them for about a year and a half and from what I understand has been treating him for bilateral ulceration on the feet, the right being in the region of the first metatarsal head plantar aspect and the left being in the area of the left big toe. He's had various antibiotics including Levaquin recently and this was then switched to Cipro and clindamycin. Due to the nature of this wound which has been there for a long while he has been recently referred to [name] for an opinion. An MRI was done in the middle of June which showed several small abscesses near the amputation site of the right great toe with draining and open wounds with surrounding cellulitis but no evidence of septic arthritis, osteomyelitis or pyomyositis. I understand at this stage he was taken to the operating room by [name] who debrided the wound and cleaned out the abscess. I understand ABIs were done bilaterally and they were within normal limits at Dr. [name] office. The patient is a smoker and is working on quitting smoking. 07/13/2017 -- had a  lower extremity arterial duplex evaluation which showed patent bilateral lower extremity arteries without evidence of hemodynamically significant stenosis. His right ABI was 1.05 and  the left was 0.95 He had a lower extremity venous duplex reflux evaluation done which showed significant venous incompetence in the bilateral common femoral, saphenofemoral junction, small saphenous vein and in the right great saphenous vein. With these findings I believe he will benefit from a consultation with the vascular surgeon regarding the endovenous ablation procedure. the patient's neurology appointment and dermatology appointment is still pending. He has had a x-ray done by his PCP and the results are not available. However he did see [name] who I understand has ordered a bilateral MRI of his feet. 07/26/2017 -- was seen by [name] on 07/20/2017. His impression was that he presented with minimal bilateral lower extremity peripheral arterial disease, bilateral lower extremity chronic venous insufficiency (C4) and radiculopathy versus neuropathy bilateral lower extremity. No arterial intervention was needed at the present time. He recommended maximal medical management at this stage for his atherosclerotic disease . He recommended compression to be continued and no venous intervention at the present time. He was offered follow-up in the Vein clinic in 3 months time. MR of the right foot done with and without contrast -- IMPRESSION:Wound on the plantar surface of the foot at approximately the level of first MTP joint with a tiny underlying soft tissue abscess. Large area of soft tissue edema subjacent to the wound is most compatible with granulation tissue. Negative for osteomyelitis about the first MTP joint. Mild edema and enhancement in the proximal phalanx of the little toe is stable compared to the prior examination and may be due to stress change. Infection is possible but thought highly unlikely.  Subcutaneous edema over the dorsum of the foot compatible with dependent change/cellulitis. the patient also has had a abdominal CT which shows a kidney mass and is going to have her MRI for this. He also has a urology opinion pending and a general surgeon's opinion regarding a colonic mass. 08/02/2017 -- his appointments with his other urology and surgical consultants is still later. He has an MRI of the left foot later this week. 08/09/2017 --MR of the left foot with and without contrast -- IMPRESSION:Large appearing skin wound on the medial aspect of the great toe without underlying abscess or osteomyelitis.First MTP osteoarthritis. Marked marrow edema in the medial sesamoid bone is likely related to osteoarthritis but could be due to sesamoiditis. 08/16/2017 -- the patient has had a surgical opinion and a urological opinion and a extensive surgery including possible colon resection, bladder surgery and kidney surgery is being planned. No date has been set for his procedure He continues to be off smoking 08/31/2017 -- he was seen by the neurologist for his multiple problems of neuropathic and radicular symptoms in both legs with a thorough workup done. MRIs were reviewed lab work was reviewed and the assessment was that he had severe neuropathy due to multifactorial reasons and a neuropathy panel of lab work was ordered and he would be reviewed back. Patient is also due for colonoscopy to be repeated before his bladder and kidney surgery. At the present time he would like [name] to continue with conservative and symptomatic wound care for both his feet. 10/05/2017 -- since I have seen him last time he has had a colonoscopy which was fairly normal except for a polyp and he has a urology appointment pending later today. He still continues to stay off smoking. 10/26/2017-- he returns after about 3 weeks and in the meanwhile he was seen by Dr. Kellie Simmering, who reviewed his venous studies and noted  that there  was very minimal enlargement of the bilateral great saphenous vein on the right and somewhat larger vein on the left but no consistent reflux throughout the great saphenous veins and there was also some right small saphenous veins which were enlarged with some reflux. However overall he thought that venous insufficiency was not significantly the cause of his ulceration and it was more of trophic ulcers and hence he recommended aggressive wound care, and at some stage he may need amputation of the left first toe and the right fifth toe. 11/09/2017 -- due to the inclement weather he has missed his urology appointment and is still awaiting the rescheduled one. Other than that he's been doing fairly well. 11/23/2017 -- he has had his cystoscopy done by his urologist and they found multiple areas of concern and he is going to be set up for a procedure sometime later in January. He is on complex care as far as his wounds go and seizures every 2-3 weeks 12/07/17 patient presents today for evaluation concerning his right great toe amputation site as well as his left great toe ulcer. He has been tolerating the dressing changes fairly well. With that being said there does not appear to be any significant discomfort at this point that he is experiencing although he has quite significant callous especially in regard to the amputation site of the right great toe. Nonetheless he has no evidence of infection which is good news No fevers, chills, nausea, or vomiting noted at this time. He has no pain. READMISSION 05/09/18; is a patient who hasn't been here in quite some time. He is not a diabetic but he has severe idiopathic peripheral neuropathy. He was here for review of a nonhealing wound on the right great toe amputation site and a large portion of the tip of the left great toe. Sometime after his last visit here he became ill he had abdominal issues infections and required hospitalizations. He largely dropped off  the map. He is putting silver alginate on the wounds. I note that he had arterial studies and saw Dr. Bridgett Larsson of vascular surgery was not felt to have an arterial issue. He also had reflux studies and he wears compression stockings and he is faithful with these. He had MRI of both of his feet that not show osteomyelitis question small abscess on the right. He tells me that the nonhealing wound on his right great toe amputation site is been there since the actual amputation which was in 2014 I note the area was aggressively debrided by Dr. Jacqualyn Posey of podiatry before he started coming to this clinic apparently there were abscess is present at that time. His last formal arterial studies were done in August 2018 which time his ABI was 1.05 on the right and 0.95 on the left he had biphasic and triphasic waveforms on the left and monophasic and biphasic waveforms on the right. He did not have TBI's. ABIs in our clinic today were 0.8 bilaterally. The patient states he is not a diabetic. He does have idiopathic peripheral neuropathy. He is a smoker but is trying to quit with Chantix. 05/16/18; x-rays of his bilateral feet were negative for osteomyelitis. He was noted to have a chronic deformity of the head of the second metatarsal which may be due to previous osteonecrosis there was no definite underlying bone destruction to suggest osteomyelitis on either side. He came in with the extensive callus over his right great toe amputation site and the left great  toe did extensive debridement last week and another extensive debridement today.patient is changing his dressing himself with silver alginate 05/30/18; still buildup of thick callus covering thick subcutaneous tissue around both of these areas. This requires an extensive debridement on both sides. I have discussed offloading this area with the patient. I think he inverts when he walks. His foot sizes beyond what we can put in a Darco forefoot off loader  or probably what we can put in a total contact cast. 06/13/18; still a remarkable buildup of callus over the right first toe amputation site. The left first toe has 2 open wounds one medially and 1 at the tip of the toe. Surrounding callus is also not back here. We've been using silver alginate. He has a darco forefoot off loader now on the right foot.his own shoe on the left foot. Would like to try a total contact cast on the right. He is making arrangements for someone to drive him here in 2 weeks. 06/29/18; still a buildup of callus over the right first toe amputation site although not as significant as previously. The left first toe has 2 open wounds one medially and one at the tip surrounding callus as well. We've been using silver alginate. He comes in today with the right for a total contact cast which I'll use on the right 07/04/18 on evaluation today patient appears to be doing decently well in regard to the cast. Unfortunately he did have a little area that rubbed on the lateral aspect of his leg the cast actually cracked on the medial aspect this happened he states just yesterday. He has not been walking in the cast without the boot portion on. With that being said he states that he is unsure of exactly what happened with the crack. Nonetheless this does seem to have caused a little bit of rubbing on the lateral aspect I think due to the integrity of the cast being compromised. Nonetheless I advised him that if this happens in the future he needs to let us know soon as possible. Nonetheless I do feel like the cast has been of benefit some of the area on the distal portion of his great toe amputation site actually appears to be doing better. 07/12/18 on evaluation today patient appears to be doing very well in regard to his right great toe invitation site. This seems to be showing signs of excellent improvement which is great news. There does not appear to be any evidence of infection at this  time. With that being said he has been tolerating the dressing changes without complication. There appears to be no evidence of infection and again this is barely open at this point. The total contact cast has been of excellent benefit for him. I believe this will likely be completely closed and ready next week that we can switch to a cast on the left foot. He states he may want to take a week's break. 07/19/18 on evaluation today patient appears to be doing better in regard to both ulceration areas. The right foot actually appears to be completely healed he still has some callous but no open wound. The left foot/great toe actually seems to be better after last week's debridement overall the appearance of the wound is greatly improved. 08/09/18 on evaluation today patient actually appears to be doing a little bit worse in regard to the right metatarsal invitation site. He's been tolerating the dressing changes without complication. With that being said he does note that  the area actually cracked open as of today when he was in the shower trying to scrub it. Nonetheless it does appear that the Caryn Section that previously was open and draining has subsequently reopened and is draining again. Nonetheless as I was looking at him and performing the debridement today I did inquire about whether or not we had ever biopsy the area as I was concerned about the possibility for something along the lines of a work type virus/infection causing the excessive callous buildup. Especially since he seems to grow much more than what would just be traditionally expected by friction alone. He subsequently told me that he did have this biopsied somewhere around 1-2 years ago by Dr. Jacqualyn Posey and that it showed that he had a wart infection at that point. Nonetheless he was never referred to dermatology better Jacqualyn Posey attempted to cut it out according to the patient. Nonetheless that may be part of the big issue with what we're  dealing with here and without treating the wart infection we may not actually get this to heal and stay closed. READMISSION 12/07/2018 The patient is back in clinic today essentially with wounds in the same area and condition is that I think the last 2 times he has been here. This includes the right first metatarsal head in the setting of a previous right great toe amputation and a large part of the medial part of his left great toe. The patient states he has been dealing with this for years dating back to 2005 on and off. That he is never found this to be totally healed. When he was last in this clinic he had this biopsied shave biopsies which showed hyperkeratotic and parakeratotic debris. Epithelium with papillary features negative for malignancy. He has been using silver alginate. He has healing sandals which he uses Felt on the bottom of these. When he was here last time we put him a total contact cast at least for a while and the patient states on the right that helped with the buildup of hyperkeratotic tissue and that seems to be reflective in our notes. He also states that was previously biopsied by Dr. Jacqualyn Posey of podiatry. The patient has a severe idiopathic peripheral neuropathy which he attributes to a brown recluse spider bite in 2002 or so. He is not a diabetic The patient had arterial studies in this showed an ABI in the right of 1.05 on the left was 0.95. TBI's were not done. He had monophasic waveforms at the right posterior tibial biphasic at the dorsalis pedis. On the left he was biphasic at the posterior tibial and triphasic at the dorsalis pedis. 1/17; this is a patient with a very challenging issue was readmitted to the clinic last week. He has a large area over the first right MTP in the setting of a previous right great toe amputation. On presentation this is covered with copious amounts of hyperkeratotic thick callus nonviable tissue. When this is debridement both on this  occasion and previous occasions in this clinic he has a very boggy presentation to the subcutaneous tissue which looks somewhat atypical. He has a similar area on the left great toe from the tip of the toe down the medial aspect beyond the interphalangeal joint. He's had previous MRIs of both of his feet. On the right that showed small abscesses and he was taken to the OR by podiatry for debridement and cleaning out of the abscesses. An MRI did not show osteomyelitis of the left first toe.  Both the MRIs were done in 2018. He saw Dr. Vallarie Mare at the time who did not think he had significant macrovascular disease. He is not a diabetic but he has idiopathic peripheral neuropathy and was a significant smoker. When he was in the clinic earlier in 2019 he had biopsies done of both areas that showed hyperkeratotic material but no malignancy. X-rays I ordered last week did not show osteomyelitis on the left but did suggest osteomyelitis at the tip of the left great toe. I looked at the x-rays myself this is a subtle finding it is only seen on the lateral film. I did a very significant debridement on him last week bilaterally in both the wound areas look better. When he was previously in our clinic the area on the right did improve with a total contact cast. He dropped out of the schedule at that point I'm not exactly sure why 1/24; the biopsy I did of the area over his right first metatarsal head showed the possibility of verruca vulgaris. Superimposed changes of lichen simplex chronicus. If this is a plantar wart this would be extremely large. I think he would need topical destructive therapy and I am going to refer him to dermatology. There was no suggestion of malignancy. Previous biopsies in this clinic on both his areas did not show evidence of malignancy either. Until we get dermatology I am going to continue with silver alginate. X-rays suggested osteomyelitis of the left toe I am going to continue him on  trimethoprim sulfamethoxazole 2/3; not much change in this area on the right first met head. Extensive dark eschar formation around the central area of nonviable subcutaneous tissue looking like a boggy subcutaneous tissue. Using a #10 scalpel and pickups I removed all of this. I have tried this before and this man but I plan to put him in a total contact cast next week ooUsing a #5 curette debridement of the left great toe ooHe has an appointment with dermatology at [facility] on the 25th 2/11; right first metatarsal head after last week's extensive debridement is right back to the way it was with a nonviable subcutaneous tissue and thick surrounding eschar. The left great toe looks about the same. The patient could not have a total contact cast today because he had household problems [leaking roof] but I am not going to put a cast on him until I have information from the dermatologist about this. READMISSION 06/17/2020 Mr. Tinoco is now a 64 year old man that we have had in this clinic on at least 2 occasions previously. When he was last here in February 2020 I referred him to dermatology at [facility] for their review of large wounds on the right first metatarsal head with surrounding severe chronic hyperkeratosis. This was biopsied on at least 2 occasions in our clinic and I think at [facility] as well that did not show an underlying malignancy. I have reviewed the consult from Dr. Sharlette Dense dermatology from 01/23/2019. At that time the patient had wounds on the plantar right first metatarsal head as well as the plantar left great toe. From what he is saying the left great toe healed however he recently had to have a left TMA by Dr. 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 Dermatology at [facility] apparently referred him to Dr. Luetta Nutting of plastic surgery and an operative excision of this area was planned however apparently they ran into the pandemic. The  procedure was  repetitively cancel and the patient did not keep his final appointment which I think was earlier this year he was also seen in February of this year at the wound care center in [facility] by Dr. Zigmund Daniel. They were applying Silvadene cream and Xeroform which she is essentially doing now. I see in his notes that there was still some concern about underlying malignancy although would be difficult to believe as long as this is been there that there would not be more extensive damage at this point in any case he is back in our clinic for review of this. He had an x-ray of the foot on 01/23/2020 that was negative for osteomyelitis acute fracture. Past medical history; idiopathic peripheral neuropathy, right great toe amputation in 2011, lumbar spinal stenosis, left transmetatarsal amputation on 05/30/2020 by Dr. Sharol Given and more recently apparently a slight wound dehiscence. He also had a fracture of his left tibial plateau sometime in 2020. ABI in our clinic was 0.95 8/9-Patient returns after being established in the clinic last time, he has a new open area on the plantar aspect of his right second toe, the right foot plantar ulcer below the great toe on the right looks slightly smaller, using PolyMem. Patient attends to his surgeon for the left foot wound Objective Constitutional alert and oriented x 3. sitting or standing blood pressure is within target range for patient.. supine blood pressure is within target range for patient.. pulse regular and within target range for patient.Marland Kitchen respirations regular, non-labored and within target range for patient.Marland Kitchen temperature within target range for patient.. Well- nourished and well-hydrated in no acute distress. Vitals Time Taken: 9:33 AM, Height: 80 in, Weight: 350 lbs, BMI: 38.4, Temperature: 98.6 F, Pulse: 86 bpm, Respiratory Rate: 18 breaths/min, Blood Pressure: 169/92 mmHg. General Notes: Thick callus skin below the open area on the plantar second toe  right, some of this was removed using scalpel and curette but this is extremely tough to remove Integumentary (Hair, Skin) Wound #12 status is Open. Original cause of wound was Gradually Appeared. The wound is located on the Right Metatarsal head first. The wound measures 3cm length x 3.1cm width x 0.2cm depth; 7.304cm^2 area and 1.461cm^3 volume. There is Fat Layer (Subcutaneous Tissue) Exposed exposed. There is no tunneling or undermining noted. There is a medium amount of serosanguineous drainage noted. The wound margin is thickened. There is large (67-100%) red granulation within the wound bed. There is no necrotic tissue within the wound bed. Wound #13 status is Open. Original cause of wound was Gradually Appeared. The wound is located on the Right T Second. The wound measures 0.3cm oe length x 1.5cm width x 0.4cm depth; 0.353cm^2 area and 0.141cm^3 volume. There is Fat Layer (Subcutaneous Tissue) Exposed exposed. There is no tunneling or undermining noted. There is a small amount of serosanguineous drainage noted. There is medium (34-66%) red granulation within the wound bed. There is a medium (34-66%) amount of necrotic tissue within the wound bed including Adherent Slough. Assessment Active Problems ICD-10 Non-pressure chronic ulcer of other part of right foot with other specified severity Other idiopathic peripheral autonomic neuropathy Procedures Wound #13 Pre-procedure diagnosis of Wound #13 is a Neuropathic Ulcer-Non Diabetic located on the Right T Second . There was a Selective/Open Wound Non-Viable oe Tissue Debridement with a total area of 0.45 sq cm performed by Tobi Bastos, MD. With the following instrument(s): Curette to remove Non-Viable tissue/material. Material removed includes Callus. A time out was conducted at 09:46, prior to the  start of the procedure. A Minimum amount of bleeding was controlled with Pressure. The procedure was tolerated well with a pain level of 0  throughout and a pain level of 0 following the procedure. Post Debridement Measurements: 0.3cm length x 1.5cm width x 0.4cm depth; 0.141cm^3 volume. Character of Wound/Ulcer Post Debridement is improved. Post procedure Diagnosis Wound #13: Same as Pre-Procedure Plan Follow-up Appointments: Return Appointment in 2 weeks. Dressing Change Frequency: Wound #12 Right Metatarsal head first: Change Dressing every other day. Wound #13 Right T Second: oe Change Dressing every other day. Wound Cleansing: Clean wound with Wound Cleanser - or normal saline Primary Wound Dressing: Wound #12 Right Metatarsal head first: Polymem Silver Wound #13 Right T Second: oe Polymem Silver Secondary Dressing: Wound #12 Right Metatarsal head first: Kerlix/Rolled Gauze Dry Gauze Other: - felt callous pad or foam donut Wound #13 Right T Second: oe Kerlix/Rolled Gauze Dry Gauze Off-Loading: Open toe surgical shoe to: - right foot Home Health: [facility] for wound care. Alvis Lemmings -Continues in PolyMem to the right foot plantar wound -We will use PolyMem also to the second toe plantar wound, patient continuing to offload with his surgical shoe -Return in 2 weeks Electronic Signature(s) Signed: 07/07/2020 9:54:41 AM By: Tobi Bastos MD, MBA Entered By: Tobi Bastos on 07/07/2020 09:54:41 -------------------------------------------------------------------------------- SuperBill Details Patient Name: Date of Service: [name], [name] 07/07/2020 Medical Record Number: [id number] Patient Account Number: [id number] Date of Birth/Sex: Treating RN: [birth year] (64 y.o. 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

## 2020-07-08 ENCOUNTER — Encounter: Payer: Self-pay | Admitting: Physician Assistant

## 2020-07-08 ENCOUNTER — Telehealth: Payer: Self-pay | Admitting: Physician Assistant

## 2020-07-08 ENCOUNTER — Ambulatory Visit (INDEPENDENT_AMBULATORY_CARE_PROVIDER_SITE_OTHER): Payer: Medicare PPO | Admitting: Physician Assistant

## 2020-07-08 VITALS — Ht >= 80 in | Wt 369.0 lb

## 2020-07-08 DIAGNOSIS — Z89432 Acquired absence of left foot: Secondary | ICD-10-CM

## 2020-07-08 MED ORDER — SULFAMETHOXAZOLE-TRIMETHOPRIM 800-160 MG PO TABS: 1.0000 | ORAL_TABLET | Freq: Two times a day (BID) | ORAL | 0 refills | Status: DC

## 2020-07-08 MED ORDER — OXYCODONE-ACETAMINOPHEN 5-325 MG PO TABS: 1.0000 | ORAL_TABLET | ORAL | 0 refills | Status: DC | PRN

## 2020-07-08 NOTE — Telephone Encounter (Signed)
[name] from [facility] called. She would like to extend home health for 4 weeks. Her call back number is [phone]

## 2020-07-08 NOTE — Progress Notes (Signed)
DIA, JEFFERYS ([id number]) Visit Report for 07/07/2020 Arrival Information Details Patient Name: Date of Service: [name], [name] 07/07/2020 9:30 A M Medical Record Number: [id number] Patient Account Number: [id number] Date of Birth/Sex: Treating RN: March 02, 1956 (64 y.o. [name]) [name] Primary Care Dequavion Follette: [name] Other Clinician: Referring Aubery Date: Treating Jenisha Faison/Extender: Fernande Boyden in Treatment: 2 Visit Information History Since Last Visit All ordered tests and consults were completed: No Patient Arrived: Ambulatory Added or deleted any medications: No Arrival Time: 09:30 Any new allergies or adverse reactions: No Accompanied By: self Had a fall or experienced change in No Transfer Assistance: None activities of daily living that may affect Patient Identification Verified: Yes risk of falls: Secondary Verification Process Completed: Yes Signs or symptoms of abuse/neglect since last visito No Patient Requires Transmission-Based Precautions: No Hospitalized since last visit: No Patient Has Alerts: No Implantable device outside of the clinic excluding No cellular tissue based products placed in the center since last visit: Has Dressing in Place as Prescribed: Yes Pain Present Now: No Electronic Signature(s) Signed: 07/08/2020 5:05:11 PM By: [name] RN Entered By: [name] on 07/07/2020 09:33:45 -------------------------------------------------------------------------------- Encounter Discharge Information Details Patient Name: Date of Service: [name], [name] 07/07/2020 9:30 A M Medical Record Number: [id number] Patient Account Number: [id number] Date of Birth/Sex: Treating RN: October 24, 1956 (64 y.o. [name] Primary Care Greer Wainright: [name] Other Clinician: Referring Jakye Mullens: Treating Willetta York/Extender: Sallyanne Kuster Weeks in Treatment: 2 Encounter Discharge Information Items Post  Procedure Vitals Discharge Condition: Stable Temperature (F): 98.6 Ambulatory Status: Ambulatory Pulse (bpm): 86 Discharge Destination: Home Respiratory Rate (breaths/min): 18 Transportation: Private Auto Blood Pressure (mmHg): 169/92 Accompanied By: self Schedule Follow-up Appointment: Yes Clinical Summary of Care: Patient Declined Electronic Signature(s) Signed: 07/07/2020 4:51:12 PM By: [name] RN, BSN Entered By: [name] on 07/07/2020 10:12:16 -------------------------------------------------------------------------------- Lower Extremity Assessment Details Patient Name: Date of Service: [name], [name] 07/07/2020 9:30 A M Medical Record Number: [id number] Patient Account Number: [id number] Date of Birth/Sex: Treating RN: Jul 21, 1956 (64 y.o. [name]) [name] Primary Care Wyley Hack: [name] Other Clinician: Referring [place] Huberty: Treating Collins Dimaria/Extender: Sallyanne Kuster Weeks in Treatment: 2 Edema Assessment Assessed: [Left: No] [Right: No] Edema: [Left: Ye] [Right: s] Calf Left: Right: Point of Measurement: cm From Medial Instep cm 45 cm Ankle Left: Right: Point of Measurement: cm From Medial Instep cm 27 cm Electronic Signature(s) Signed: 07/08/2020 5:05:11 PM By: [name] RN Entered By: [name] on 07/07/2020 09:34:32 -------------------------------------------------------------------------------- Multi-Disciplinary Care Plan Details Patient Name: Date of Service: [name], [name] 07/07/2020 9:30 A M Medical Record Number: [id number] Patient Account Number: [id number] Date of Birth/Sex: Treating RN: 09/01/1956 (64 y.o. [name] Primary Care Maezie Justin: [name] Other Clinician: Referring Jayleene Glaeser: Treating Wyn Nettle/Extender: Sallyanne Kuster Weeks in Treatment: 2 Active Inactive Abuse / Safety / Falls / Self Care Management Nursing Diagnoses: Potential for falls Potential for  injury related to falls Goals: Patient will not experience any injury related to falls Date Initiated: 06/17/2020 Target Resolution Date: 07/18/2020 Goal Status: Active Patient/caregiver will verbalize/demonstrate measures taken to prevent injury and/or falls Date Initiated: 06/17/2020 Target Resolution Date: 07/18/2020 Goal Status: Active Interventions: Assess Activities of Daily Living upon admission and as needed Assess fall risk on admission and as needed Assess: immobility, friction, shearing, incontinence upon admission and as needed Assess impairment of mobility on admission and as needed per policy Assess personal safety  and home safety (as indicated) on admission and as needed Provide education on fall prevention Provide education on personal and home safety Notes: Wound/Skin Impairment Nursing Diagnoses: Impaired tissue integrity Knowledge deficit related to ulceration/compromised skin integrity Goals: Patient/caregiver will verbalize understanding of skin care regimen Date Initiated: 06/17/2020 Target Resolution Date: 07/18/2020 Goal Status: Active Ulcer/skin breakdown will have a volume reduction of 30% by week 4 Date Initiated: 06/17/2020 Target Resolution Date: 07/18/2020 Goal Status: Active Interventions: Assess patient/caregiver ability to obtain necessary supplies Assess patient/caregiver ability to perform ulcer/skin care regimen upon admission and as needed Assess ulceration(s) every visit Provide education on ulcer and skin care Notes: Electronic Signature(s) Signed: 07/07/2020 5:19:36 PM By: Levan Hurst RN, BSN Entered By: Levan Hurst on 07/07/2020 10:09:24 -------------------------------------------------------------------------------- Pain Assessment Details Patient Name: Date of Service: [name], [name] 07/07/2020 9:30 A M Medical Record Number: [id number] Patient Account Number: [id number] Date of Birth/Sex: Treating RN: Dec 15, 1955 (64 y.o. [name])  [name] Primary Care Aulden Calise: [name] Other Clinician: Referring Lahari Suttles: Treating Laquonda Welby/Extender: Sallyanne Kuster Weeks in Treatment: 2 Active Problems Location of Pain Severity and Description of Pain Patient Has Paino No Site Locations Pain Management and Medication Current Pain Management: Electronic Signature(s) Signed: 07/08/2020 5:05:11 PM By: [name] RN Entered By: [name] on 07/07/2020 09:34:18 -------------------------------------------------------------------------------- Patient/Caregiver Education Details Patient Name: Date of Service: [name], [name] 8/9/2021andnbsp9:30 A M Medical Record Number: [id number] Patient Account Number: [id number] Date of Birth/Gender: Treating RN: [birth year] (64 y.o. [name] Primary Care Physician: [name] Other Clinician: Referring Physician: Treating Physician/Extender: Fernande Boyden in Treatment: 2 Education Assessment Education Provided To: Patient Education Topics Provided Wound/Skin Impairment: Methods: Explain/Verbal Responses: State content correctly Electronic Signature(s) Signed: 07/07/2020 5:19:36 PM By: Levan Hurst RN, BSN Entered By: Levan Hurst on 07/07/2020 10:09:36 -------------------------------------------------------------------------------- Wound Assessment Details Patient Name: Date of Service: [name], [name] 07/07/2020 9:30 A M Medical Record Number: [id number] Patient Account Number: [id number] Date of Birth/Sex: Treating RN: 1956/04/14 (64 y.o. [name]) [name] Primary Care Vessie Olmsted: [name] Other Clinician: Referring Camora Tremain: Treating Salayah Meares/Extender: Sallyanne Kuster Weeks in Treatment: 2 Wound Status Wound Number: 12 Primary Neuropathic Ulcer-Non Diabetic Etiology: Wound Location: Right Metatarsal head first Wound Open Wounding Event: Gradually Appeared Status: Date  Acquired: 11/29/2018 Comorbid Anemia, Chronic Obstructive Pulmonary Disease (COPD), Weeks Of Treatment: 2 History: Hypertension, Osteoarthritis, Osteomyelitis, Neuropathy Clustered Wound: No Photos Photo Uploaded By: Mikeal Hawthorne on 07/08/2020 08:33:32 Wound Measurements Length: (cm) 3 Width: (cm) 3.1 Depth: (cm) 0.2 Area: (cm) 7.304 Volume: (cm) 1.461 % Reduction in Area: 21.7% % Reduction in Volume: 21.7% Epithelialization: Small (1-33%) Tunneling: No Undermining: No Wound Description Classification: Full Thickness Without Exposed Support Structures Wound Margin: Thickened Exudate Amount: Medium Exudate Type: Serosanguineous Exudate Color: red, brown Foul Odor After Cleansing: No Slough/Fibrino No Wound Bed Granulation Amount: Large (67-100%) Exposed Structure Granulation Quality: Red Fascia Exposed: No Necrotic Amount: None Present (0%) Fat Layer (Subcutaneous Tissue) Exposed: Yes Tendon Exposed: No Muscle Exposed: No Joint Exposed: No Bone Exposed: No Treatment Notes Wound #12 (Right Metatarsal head first) 2. Periwound Care Moisturizing lotion TCA Cream 3. Primary Dressing Applied Polymem Ag 4. Secondary Dressing Dry Gauze Roll Gauze Foam 7. Footwear/Offloading device applied Surgical shoe Electronic Signature(s) Signed: 07/08/2020 5:05:11 PM By: [name] RN Entered By: [name] on 07/07/2020 09:34:58 -------------------------------------------------------------------------------- Wound Assessment Details Patient Name: Date of Service: [name], [name] 07/07/2020 9:30 A  M Medical Record Number: [id number] Patient Account Number: [id number] Date of Birth/Sex: Treating RN: 09-Nov-1956 (64 y.o. [name]) [name] Primary Care Aleighna Wojtas: [name] Other Clinician: Referring Teauna Dubach: Treating Donisha Hoch/Extender: Sallyanne Kuster Weeks in Treatment: 2 Wound Status Wound Number: 13 Primary Neuropathic Ulcer-Non  Diabetic Etiology: Wound Location: Right T Second oe Wound Open Wounding Event: Gradually Appeared Status: Date Acquired: 07/04/2020 Comorbid Anemia, Chronic Obstructive Pulmonary Disease (COPD), Weeks Of Treatment: 0 History: Hypertension, Osteoarthritis, Osteomyelitis, Neuropathy Clustered Wound: No Photos Photo Uploaded By: Mikeal Hawthorne on 07/08/2020 08:33:33 Wound Measurements Length: (cm) 0.3 Width: (cm) 1.5 Depth: (cm) 0.4 Area: (cm) 0.353 Volume: (cm) 0.141 % Reduction in Area: % Reduction in Volume: Epithelialization: None Tunneling: No Undermining: No Wound Description Classification: Full Thickness Without Exposed Support Structures Exudate Amount: Small Exudate Type: Serosanguineous Exudate Color: red, brown Foul Odor After Cleansing: No Slough/Fibrino Yes Wound Bed Granulation Amount: Medium (34-66%) Exposed Structure Granulation Quality: Red Fascia Exposed: No Necrotic Amount: Medium (34-66%) Fat Layer (Subcutaneous Tissue) Exposed: Yes Necrotic Quality: Adherent Slough Tendon Exposed: No Muscle Exposed: No Joint Exposed: No Bone Exposed: No Treatment Notes Wound #13 (Right Toe Second) 3. Primary Dressing Applied Polymem Ag 4. Secondary Dressing Roll Gauze 7. Footwear/Offloading device applied Surgical shoe Electronic Signature(s) Signed: 07/08/2020 5:05:11 PM By: [name] RN Entered By: [name] on 07/07/2020 09:36:30 -------------------------------------------------------------------------------- Vitals Details Patient Name: Date of Service: [name], [name] 07/07/2020 9:30 A M Medical Record Number: [id number] Patient Account Number: [id number] Date of Birth/Sex: Treating RN: [birth year] (64 y.o. [name]) [name] Primary Care Dejana Pugsley: [name] Other Clinician: Referring Lita Flynn: Treating Gershon Shorten/Extender: Sallyanne Kuster Weeks in Treatment: 2 Vital Signs Time Taken: 09:33 Temperature (F):  98.6 Height (in): 80 Pulse (bpm): 86 Weight (lbs): 350 Respiratory Rate (breaths/min): 18 Body Mass Index (BMI): 38.4 Blood Pressure (mmHg): 169/92 Reference Range: 80 - 120 mg / dl Electronic Signature(s) Signed: 07/08/2020 5:05:11 PM By: [name] RN Entered By: [name] on 07/07/2020 09:34:09

## 2020-07-08 NOTE — Progress Notes (Signed)
Office Visit Note   Patient: [name].           Date of Birth: [birth year]           MRN: [id number] Visit Date: 07/08/2020              Requested by: [name], MD [address] [place],  [place] [address] PCP: [name], MD  Chief Complaint  Patient presents with  . Left Foot - Routine Post Op    05/30/20 left foot transmet amputation       HPI: This is a pleasant 64 year old gentleman who is 5 weeks status post left foot transmetatarsal amputation.  He is currently using both Trental and a nitroglycerin patch.  He states he only uses half the patch.  He is weightbearing in a postop shoe and a cane.  He also was on a course of Bactrim.  Denies any fever chills  Assessment & Plan: Visit Diagnoses: No diagnosis found.  Plan: We will continue with daily cleansing and dry dressing changes.  Have called him in a refill for the Bactrim.  Would like to see him again in 2 weeks.  Also discussed elevation and offloading his foot a bit more with crutches  Follow-Up Instructions: No follow-ups on file.   Ortho Exam  Patient is alert, oriented, no adenopathy, well-dressed, normal affect, normal respiratory effort. Left transmetatarsal amputation stump moderate amount of soft tissue swelling but no cellulitis.  He has try phasic dorsalis pedis pulse and biphasic posterior tibial tendon pulses.  He does have some superficial necrosis on the plantar side.  There is no foul odor.  No purulent drainage.  Not tender to palpation.  No ascending cellulitis.  He does have dehiscence but there is good vascular granulation tissue  Imaging: No results found. No images are attached to the encounter.  Labs: Lab Results  Component Value Date   HGBA1C 6.0 (H) 08/23/2017   HGBA1C 5.3 03/12/2014   HGBA1C 5.4 07/04/2012   ESRSEDRATE 14 06/14/2017   ESRSEDRATE 30 05/17/2017   ESRSEDRATE 23 (H) 01/31/2015   CRP 23.4 (H) 06/14/2017   CRP 37.4 (H) 05/17/2017   CRP 55.8 (H)  03/29/2017   REPTSTATUS 09/17/2010 FINAL 08/03/2010   GRAMSTAIN  08/02/2010    FEW WBC PRESENT, PREDOMINANTLY PMN NO SQUAMOUS EPITHELIAL CELLS SEEN RARE GRAM NEGATIVE RODS   CULT NO ACID FAST BACILLI ISOLATED IN 6 WEEKS 08/03/2010     Lab Results  Component Value Date   ALBUMIN 4.0 03/12/2014   ALBUMIN 3.0 (L) 07/04/2012   ALBUMIN 1.7 (L) 08/02/2010   PREALBUMIN 4.3 (L) 08/02/2010    Lab Results  Component Value Date   MG 2.4 07/04/2012   MG 2.2 08/01/2010   No results found for: VD25OH  Lab Results  Component Value Date   PREALBUMIN 4.3 (L) 08/02/2010   CBC EXTENDED Latest Ref Rng & Units 05/30/2020 01/02/2018 01/01/2018  WBC 4.0 - 10.5 K/uL 9.0 12.4(H) 15.1(H)  RBC 4.22 - 5.81 MIL/uL 4.67 4.16(L) 4.09(L)  HGB 13.0 - 17.0 g/dL 13.6 12.5(L) 12.4(L)  HCT 39 - 52 % 43.3 38.4(L) 37.6(L)  PLT 150 - 400 K/uL 445(H) 223 232  NEUTROABS 1 - 7 x10E3/uL - - -  LYMPHSABS 0 - 3 x10E3/uL - - -     Body mass index is 40.54 kg/m.  Orders:  No orders of the defined types were placed in this encounter.  Meds ordered this encounter  Medications  . sulfamethoxazole-trimethoprim (BACTRIM  DS) 800-160 MG tablet    Sig: Take 1 tablet by mouth 2 (two) times daily.    Dispense:  20 tablet    Refill:  0  . oxyCODONE-acetaminophen (PERCOCET/ROXICET) 5-325 MG tablet    Sig: Take 1 tablet by mouth every 4 (four) hours as needed for severe pain.    Dispense:  30 tablet    Refill:  0     Procedures: No procedures performed  Clinical Data: No additional findings.  ROS:  All other systems negative, except as noted in the HPI. Review of Systems  Objective: Vital Signs: Ht 6\' 8"  (2.032 m)   Wt (!) 369 lb (167.4 kg)   BMI 40.54 kg/m   Specialty Comments:  No specialty comments available.  PMFS History: Patient Active Problem List   Diagnosis Date Noted  . Abscess of left foot 05/30/2020  . Subacute osteomyelitis, left ankle and foot ([facility])   . Skin ulcers of both feet ([facility])  10/24/2017  . Colovesical fistula   . Benign neoplasm of cecum   . Chronic venous insufficiency 07/20/2017  . Chronic left-sided low back pain without sciatica 11/08/2016  . Toe ulcer ([facility]) 03/08/2016  . Verruca 12/28/2015  . Pre-ulcerative calluses 12/28/2015  . History of adenomatous polyp of colon 06/09/2015  . Lipoma 03/13/2014  . Plantar warts 07/09/2012  . Cellulitis 07/04/2012  . Neuropathy 07/04/2012  . Hyperglycemia 07/04/2012  . HYPERTENSION, BENIGN 10/20/2010  . EDEMA 08/20/2010  . WISDOM TEETH EXTRACTION, HX OF 08/20/2010  . HYPOALBUMINEMIA 08/19/2010  . ANEMIA 08/19/2010  . NICOTINE ADDICTION 08/19/2010  . NECROTIZING PNEUMONIA 08/19/2010  . OSTEOARTHRITIS 08/19/2010   Past Medical History:  Diagnosis Date  . Arthritis   . Chronic kidney disease    mass r kidney - partial nephrectomy  . COPD (chronic obstructive pulmonary disease) ([facility])   . Diverticulitis   . History of kidney stones   . Hx of adenomatous colonic polyps 06/09/2015  . Hyperlipidemia   . Hypertension    has previously been on medication but then taken off - 05/29/20 pt states he's never been treated for HTN  . Neuromuscular disorder (HCC)    neuropathy lower legs and feet  . Neuropathy    in both feet  . Pneumonia 2011    Family History  Problem Relation Age of Onset  . Heart disease Mother   . Esophageal cancer Mother   . Diabetes Father   . Pancreatic cancer Father   . Breast cancer Sister   . Colon cancer Neg Hx   . Rectal cancer Neg Hx   . Stomach cancer Neg Hx   . Colon polyps Neg Hx     Past Surgical History:  Procedure Laterality Date  . AMPUTATION Bilateral 06/05/2013   Procedure: RIGHT GREAT TOE AMPUTATION/LEFT GREAT TOE DEBRIDEMENT;  Surgeon: Wylene Simmer, MD;  Location: [facility];  Service: Orthopedics;  Laterality: Bilateral;  . AMPUTATION Left 05/30/2020   Procedure: LEFT TRANSMETATARSAL AMPUTATION;  Surgeon: Newt Minion, MD;  Location: [facility];  Service: Orthopedics;   Laterality: Left;  . APPLICATION OF WOUND VAC Left 05/30/2020   Procedure: APPLICATION OF WOUND VAC;  Surgeon: Newt Minion, MD;  Location: [facility];  Service: Orthopedics;  Laterality: Left;  . COLON SURGERY     robotic partial colectomy Dr. Marcello Moores 12-30-17  . COLONOSCOPY     x 2 last date 09/21/2017 bladder attached to colon eval  . COLONOSCOPY WITH PROPOFOL N/A 09/21/2017   Procedure: COLONOSCOPY WITH PROPOFOL;  Surgeon: [name], MD;  Location: [facility];  Service: Endoscopy;  Laterality: N/A;  . MOUTH SURGERY    . PARTIAL NEPHRECTOMY     right Dr. Leighton Ruff 01-27[birth year]58  . ROBOTIC ASSITED PARTIAL NEPHRECTOMY Right 12/30/2017   Procedure: XI ROBOTIC ASSITED RIGHT PARTIAL NEPHRECTOMY;  Surgeon: Alexis Frock, MD;  Location: [facility];  Service: Urology;  Laterality: Right;  . TOE AMPUTATION Right 06/05/2013   RIGHT GREAT TOE PARTIAL AMPUTATION    . TOOTH EXTRACTION  2011   multiple teeth extracted   Social History   Occupational History  . Occupation: retired  Tobacco Use  . Smoking status: Current Some Day Smoker    Packs/day: 0.10    Years: 20.00    Pack years: 2.00    Types: Cigarettes    Start date: 11/29/1973  . Smokeless tobacco: Never Used  . Tobacco comment: occasional smoker - ~1 pack/week. smoked ~1.5ppd until 5 yr ago  Vaping Use  . Vaping Use: Never used  Substance and Sexual Activity  . Alcohol use: No    Alcohol/week: 0.0 standard drinks  . Drug use: No  . Sexual activity: Not Currently    Partners: Female

## 2020-07-09 NOTE — Telephone Encounter (Signed)
05/30/20 left foot transmet amputation I called and  lm on vm gave verbal ok for orders as requested below. To call with any questions.

## 2020-07-10 DIAGNOSIS — N189 Chronic kidney disease, unspecified: Secondary | ICD-10-CM | POA: Diagnosis not present

## 2020-07-10 DIAGNOSIS — J449 Chronic obstructive pulmonary disease, unspecified: Secondary | ICD-10-CM | POA: Diagnosis not present

## 2020-07-10 DIAGNOSIS — L97526 Non-pressure chronic ulcer of other part of left foot with bone involvement without evidence of necrosis: Secondary | ICD-10-CM | POA: Diagnosis not present

## 2020-07-10 DIAGNOSIS — I129 Hypertensive chronic kidney disease with stage 1 through stage 4 chronic kidney disease, or unspecified chronic kidney disease: Secondary | ICD-10-CM | POA: Diagnosis not present

## 2020-07-10 DIAGNOSIS — M86672 Other chronic osteomyelitis, left ankle and foot: Secondary | ICD-10-CM | POA: Diagnosis not present

## 2020-07-10 DIAGNOSIS — G629 Polyneuropathy, unspecified: Secondary | ICD-10-CM | POA: Diagnosis not present

## 2020-07-10 DIAGNOSIS — Z4801 Encounter for change or removal of surgical wound dressing: Secondary | ICD-10-CM | POA: Diagnosis not present

## 2020-07-10 DIAGNOSIS — K5792 Diverticulitis of intestine, part unspecified, without perforation or abscess without bleeding: Secondary | ICD-10-CM | POA: Diagnosis not present

## 2020-07-10 DIAGNOSIS — Z4781 Encounter for orthopedic aftercare following surgical amputation: Secondary | ICD-10-CM | POA: Diagnosis not present

## 2020-07-14 ENCOUNTER — Ambulatory Visit (INDEPENDENT_AMBULATORY_CARE_PROVIDER_SITE_OTHER): Payer: Medicare PPO | Admitting: Physician Assistant

## 2020-07-14 ENCOUNTER — Other Ambulatory Visit: Payer: Self-pay

## 2020-07-14 ENCOUNTER — Telehealth: Payer: Self-pay

## 2020-07-14 ENCOUNTER — Encounter: Payer: Self-pay | Admitting: Physician Assistant

## 2020-07-14 VITALS — Ht >= 80 in | Wt 369.0 lb

## 2020-07-14 DIAGNOSIS — K5792 Diverticulitis of intestine, part unspecified, without perforation or abscess without bleeding: Secondary | ICD-10-CM | POA: Diagnosis not present

## 2020-07-14 DIAGNOSIS — Z4801 Encounter for change or removal of surgical wound dressing: Secondary | ICD-10-CM | POA: Diagnosis not present

## 2020-07-14 DIAGNOSIS — N189 Chronic kidney disease, unspecified: Secondary | ICD-10-CM | POA: Diagnosis not present

## 2020-07-14 DIAGNOSIS — G629 Polyneuropathy, unspecified: Secondary | ICD-10-CM | POA: Diagnosis not present

## 2020-07-14 DIAGNOSIS — L97526 Non-pressure chronic ulcer of other part of left foot with bone involvement without evidence of necrosis: Secondary | ICD-10-CM | POA: Diagnosis not present

## 2020-07-14 DIAGNOSIS — J449 Chronic obstructive pulmonary disease, unspecified: Secondary | ICD-10-CM | POA: Diagnosis not present

## 2020-07-14 DIAGNOSIS — Z89432 Acquired absence of left foot: Secondary | ICD-10-CM

## 2020-07-14 DIAGNOSIS — Z4781 Encounter for orthopedic aftercare following surgical amputation: Secondary | ICD-10-CM | POA: Diagnosis not present

## 2020-07-14 DIAGNOSIS — I129 Hypertensive chronic kidney disease with stage 1 through stage 4 chronic kidney disease, or unspecified chronic kidney disease: Secondary | ICD-10-CM | POA: Diagnosis not present

## 2020-07-14 DIAGNOSIS — M86672 Other chronic osteomyelitis, left ankle and foot: Secondary | ICD-10-CM | POA: Diagnosis not present

## 2020-07-14 MED ORDER — OXYCODONE-ACETAMINOPHEN 5-325 MG PO TABS: 1.0000 | ORAL_TABLET | ORAL | 0 refills | Status: DC | PRN

## 2020-07-14 MED ORDER — SULFAMETHOXAZOLE-TRIMETHOPRIM 800-160 MG PO TABS: 1.0000 | ORAL_TABLET | Freq: Two times a day (BID) | ORAL | 0 refills | Status: DC

## 2020-07-14 NOTE — Progress Notes (Signed)
Office Visit Note   Patient: [name].           Date of Birth: 06-02-1956           MRN: [id number] Visit Date: 07/14/2020              Requested by: [name], MD [address] [place],  [place] [address] PCP: [name], MD  Chief Complaint  Patient presents with  . Left Foot - Routine Post Op    05/30/20 left transmet amputation       HPI: This is a pleasant gentleman who is 6 weeks status post left transmetatarsal amputation.  We received a phone call earlier today from his home health nurse who stated that there were maggots in the wound.  She did thoroughly cleanse the wound.  He presents for evaluation.  He denies any fever or chills.  Denies any increase in pain.  He does say that he was out in his garden a little bit picking tomatoes  Assessment & Plan: Visit Diagnoses: No diagnosis found.  Plan: I spoke with the patient and asked that he refrain from being outdoors in a garden with his foot.  He will continue his antibiotics and I refilled his pain medication.  He already has an appointment scheduled for next week he should keep this.  He should do daily antibiotic cleansing's and a dry apply dry dressing  Follow-Up Instructions: No follow-ups on file.   Ortho Exam  Patient is alert, oriented, no adenopathy, well-dressed, normal affect, normal respiratory effort. Focused examination of his left transmetatarsal dehiscence.  Over the central portion of the plantar necrotic tissue I did debride this and maggots were noted to be crawling out of the necrotic tissue.  There is no surrounding cellulitis.  There is some foul odor associated with the necrotic tissue.  I did debride I did debride the necrotic tissue to a white fibrous tissue or healthy bleeding tissue.  After removing all necrotic tissue I cannot visualize any more.  I did copiously irrigate this with 1 part hydrogen peroxide to 10 parts water.  Following this I also irrigated it with normal  saline solution.  Dry dressing was applied.  Imaging: No results found. No images are attached to the encounter.  Labs: Lab Results  Component Value Date   HGBA1C 6.0 (H) 08/23/2017   HGBA1C 5.3 03/12/2014   HGBA1C 5.4 07/04/2012   ESRSEDRATE 14 06/14/2017   ESRSEDRATE 30 05/17/2017   ESRSEDRATE 23 (H) 01/31/2015   CRP 23.4 (H) 06/14/2017   CRP 37.4 (H) 05/17/2017   CRP 55.8 (H) 03/29/2017   REPTSTATUS 09/17/2010 FINAL 08/03/2010   GRAMSTAIN  08/02/2010    FEW WBC PRESENT, PREDOMINANTLY PMN NO SQUAMOUS EPITHELIAL CELLS SEEN RARE GRAM NEGATIVE RODS   CULT NO ACID FAST BACILLI ISOLATED IN 6 WEEKS 08/03/2010     Lab Results  Component Value Date   ALBUMIN 4.0 03/12/2014   ALBUMIN 3.0 (L) 07/04/2012   ALBUMIN 1.7 (L) 08/02/2010   PREALBUMIN 4.3 (L) 08/02/2010    Lab Results  Component Value Date   MG 2.4 07/04/2012   MG 2.2 08/01/2010   No results found for: VD25OH  Lab Results  Component Value Date   PREALBUMIN 4.3 (L) 08/02/2010   CBC EXTENDED Latest Ref Rng & Units 05/30/2020 01/02/2018 01/01/2018  WBC 4.0 - 10.5 K/uL 9.0 12.4(H) 15.1(H)  RBC 4.22 - 5.81 MIL/uL 4.67 4.16(L) 4.09(L)  HGB 13.0 - 17.0 g/dL 13.6 12.5(L)  12.4(L)  HCT 39 - 52 % 43.3 38.4(L) 37.6(L)  PLT 150 - 400 K/uL 445(H) 223 232  NEUTROABS 1 - 7 x10E3/uL - - -  LYMPHSABS 0 - 3 x10E3/uL - - -     Body mass index is 40.54 kg/m.  Orders:  No orders of the defined types were placed in this encounter.  Meds ordered this encounter  Medications  . oxyCODONE-acetaminophen (PERCOCET/ROXICET) 5-325 MG tablet    Sig: Take 1 tablet by mouth every 4 (four) hours as needed for severe pain.    Dispense:  30 tablet    Refill:  0  . sulfamethoxazole-trimethoprim (BACTRIM DS) 800-160 MG tablet    Sig: Take 1 tablet by mouth 2 (two) times daily.    Dispense:  20 tablet    Refill:  0     Procedures: No procedures performed  Clinical Data: No additional findings.  ROS:  All other systems negative,  except as noted in the HPI. Review of Systems  Objective: Vital Signs: Ht 6\' 8"  (2.032 m)   Wt (!) 369 lb (167.4 kg)   BMI 40.54 kg/m   Specialty Comments:  No specialty comments available.  PMFS History: Patient Active Problem List   Diagnosis Date Noted  . Abscess of left foot 05/30/2020  . Subacute osteomyelitis, left ankle and foot ([facility])   . Skin ulcers of both feet ([facility]) 10/24/2017  . Colovesical fistula   . Benign neoplasm of cecum   . Chronic venous insufficiency 07/20/2017  . Chronic left-sided low back pain without sciatica 11/08/2016  . Toe ulcer ([facility]) 03/08/2016  . Verruca 12/28/2015  . Pre-ulcerative calluses 12/28/2015  . History of adenomatous polyp of colon 06/09/2015  . Lipoma 03/13/2014  . Plantar warts 07/09/2012  . Cellulitis 07/04/2012  . Neuropathy 07/04/2012  . Hyperglycemia 07/04/2012  . HYPERTENSION, BENIGN 10/20/2010  . EDEMA 08/20/2010  . WISDOM TEETH EXTRACTION, HX OF 08/20/2010  . HYPOALBUMINEMIA 08/19/2010  . ANEMIA 08/19/2010  . NICOTINE ADDICTION 08/19/2010  . NECROTIZING PNEUMONIA 08/19/2010  . OSTEOARTHRITIS 08/19/2010   Past Medical History:  Diagnosis Date  . Arthritis   . Chronic kidney disease    mass r kidney - partial nephrectomy  . COPD (chronic obstructive pulmonary disease) ([facility])   . Diverticulitis   . History of kidney stones   . Hx of adenomatous colonic polyps 06/09/2015  . Hyperlipidemia   . Hypertension    has previously been on medication but then taken off - 05/29/20 pt states he's never been treated for HTN  . Neuromuscular disorder (HCC)    neuropathy lower legs and feet  . Neuropathy    in both feet  . Pneumonia 2011    Family History  Problem Relation Age of Onset  . Heart disease Mother   . Esophageal cancer Mother   . Diabetes Father   . Pancreatic cancer Father   . Breast cancer Sister   . Colon cancer Neg Hx   . Rectal cancer Neg Hx   . Stomach cancer Neg Hx   . Colon polyps Neg Hx     Past  Surgical History:  Procedure Laterality Date  . AMPUTATION Bilateral 06/05/2013   Procedure: RIGHT GREAT TOE AMPUTATION/LEFT GREAT TOE DEBRIDEMENT;  Surgeon: Wylene Simmer, MD;  Location: [facility];  Service: Orthopedics;  Laterality: Bilateral;  . AMPUTATION Left 05/30/2020   Procedure: LEFT TRANSMETATARSAL AMPUTATION;  Surgeon: Newt Minion, MD;  Location: [facility];  Service: Orthopedics;  Laterality: Left;  . APPLICATION  OF WOUND VAC Left 05/30/2020   Procedure: APPLICATION OF WOUND VAC;  Surgeon: Newt Minion, MD;  Location: [facility];  Service: Orthopedics;  Laterality: Left;  . COLON SURGERY     robotic partial colectomy Dr. Marcello Moores 12-30-17  . COLONOSCOPY     x 2 last date 09/21/2017 bladder attached to colon eval  . COLONOSCOPY WITH PROPOFOL N/A 09/21/2017   Procedure: COLONOSCOPY WITH PROPOFOL;  Surgeon: [name], MD;  Location: [facility];  Service: Endoscopy;  Laterality: N/A;  . MOUTH SURGERY    . PARTIAL NEPHRECTOMY     right Dr. Leighton Ruff 0-8-65  . ROBOTIC ASSITED PARTIAL NEPHRECTOMY Right 12/30/2017   Procedure: XI ROBOTIC ASSITED RIGHT PARTIAL NEPHRECTOMY;  Surgeon: Alexis Frock, MD;  Location: [facility];  Service: Urology;  Laterality: Right;  . TOE AMPUTATION Right 06/05/2013   RIGHT GREAT TOE PARTIAL AMPUTATION    . TOOTH EXTRACTION  2011   multiple teeth extracted   Social History   Occupational History  . Occupation: retired  Tobacco Use  . Smoking status: Current Some Day Smoker    Packs/day: 0.10    Years: 20.00    Pack years: 2.00    Types: Cigarettes    Start date: 11/29/1973  . Smokeless tobacco: Never Used  . Tobacco comment: occasional smoker - ~1 pack/week. smoked ~1.5ppd until 5 yr ago  Vaping Use  . Vaping Use: Never used  Substance and Sexual Activity  . Alcohol use: No    Alcohol/week: 0.0 standard drinks  . Drug use: No  . Sexual activity: Not Currently    Partners: Female

## 2020-07-16 DIAGNOSIS — G629 Polyneuropathy, unspecified: Secondary | ICD-10-CM | POA: Diagnosis not present

## 2020-07-16 DIAGNOSIS — N281 Cyst of kidney, acquired: Secondary | ICD-10-CM | POA: Diagnosis not present

## 2020-07-16 DIAGNOSIS — I77811 Abdominal aortic ectasia: Secondary | ICD-10-CM | POA: Diagnosis not present

## 2020-07-16 DIAGNOSIS — N189 Chronic kidney disease, unspecified: Secondary | ICD-10-CM | POA: Diagnosis not present

## 2020-07-16 DIAGNOSIS — M86672 Other chronic osteomyelitis, left ankle and foot: Secondary | ICD-10-CM | POA: Diagnosis not present

## 2020-07-16 DIAGNOSIS — I700 Atherosclerosis of aorta: Secondary | ICD-10-CM | POA: Diagnosis not present

## 2020-07-16 DIAGNOSIS — L97526 Non-pressure chronic ulcer of other part of left foot with bone involvement without evidence of necrosis: Secondary | ICD-10-CM | POA: Diagnosis not present

## 2020-07-16 DIAGNOSIS — I129 Hypertensive chronic kidney disease with stage 1 through stage 4 chronic kidney disease, or unspecified chronic kidney disease: Secondary | ICD-10-CM | POA: Diagnosis not present

## 2020-07-16 DIAGNOSIS — M47816 Spondylosis without myelopathy or radiculopathy, lumbar region: Secondary | ICD-10-CM | POA: Diagnosis not present

## 2020-07-16 DIAGNOSIS — Z4781 Encounter for orthopedic aftercare following surgical amputation: Secondary | ICD-10-CM | POA: Diagnosis not present

## 2020-07-16 DIAGNOSIS — Z4801 Encounter for change or removal of surgical wound dressing: Secondary | ICD-10-CM | POA: Diagnosis not present

## 2020-07-16 DIAGNOSIS — J449 Chronic obstructive pulmonary disease, unspecified: Secondary | ICD-10-CM | POA: Diagnosis not present

## 2020-07-16 DIAGNOSIS — K5792 Diverticulitis of intestine, part unspecified, without perforation or abscess without bleeding: Secondary | ICD-10-CM | POA: Diagnosis not present

## 2020-07-16 DIAGNOSIS — C641 Malignant neoplasm of right kidney, except renal pelvis: Secondary | ICD-10-CM | POA: Diagnosis not present

## 2020-07-21 ENCOUNTER — Telehealth: Payer: Self-pay | Admitting: Orthopedic Surgery

## 2020-07-21 ENCOUNTER — Encounter (HOSPITAL_BASED_OUTPATIENT_CLINIC_OR_DEPARTMENT_OTHER): Payer: Medicare PPO | Admitting: Internal Medicine

## 2020-07-21 DIAGNOSIS — G9009 Other idiopathic peripheral autonomic neuropathy: Secondary | ICD-10-CM | POA: Diagnosis not present

## 2020-07-21 DIAGNOSIS — L97518 Non-pressure chronic ulcer of other part of right foot with other specified severity: Secondary | ICD-10-CM | POA: Diagnosis not present

## 2020-07-21 DIAGNOSIS — G629 Polyneuropathy, unspecified: Secondary | ICD-10-CM | POA: Diagnosis not present

## 2020-07-21 NOTE — Telephone Encounter (Signed)
Pt would like a refill of oxycodone-tylenol sent in please

## 2020-07-21 NOTE — Telephone Encounter (Signed)
Called and sw pt to advise he has an appt for Wednesday will discuss at this appt.

## 2020-07-21 NOTE — Telephone Encounter (Signed)
Pt s/p a left transmet amputation 06/18/20 requesting refill on Oxycodone has had a total of 120 tabs in the past 4 weeks lat refill was 07/14/20 #30 please advise.

## 2020-07-21 NOTE — Telephone Encounter (Signed)
Too soon for narcotic refill if still having pain would recommend follow-up in the office.

## 2020-07-22 ENCOUNTER — Ambulatory Visit: Payer: Medicare PPO | Admitting: Physician Assistant

## 2020-07-22 DIAGNOSIS — J449 Chronic obstructive pulmonary disease, unspecified: Secondary | ICD-10-CM | POA: Diagnosis not present

## 2020-07-22 DIAGNOSIS — K5792 Diverticulitis of intestine, part unspecified, without perforation or abscess without bleeding: Secondary | ICD-10-CM | POA: Diagnosis not present

## 2020-07-22 DIAGNOSIS — M86672 Other chronic osteomyelitis, left ankle and foot: Secondary | ICD-10-CM | POA: Diagnosis not present

## 2020-07-22 DIAGNOSIS — G629 Polyneuropathy, unspecified: Secondary | ICD-10-CM | POA: Diagnosis not present

## 2020-07-22 DIAGNOSIS — L97526 Non-pressure chronic ulcer of other part of left foot with bone involvement without evidence of necrosis: Secondary | ICD-10-CM | POA: Diagnosis not present

## 2020-07-22 DIAGNOSIS — Z4781 Encounter for orthopedic aftercare following surgical amputation: Secondary | ICD-10-CM | POA: Diagnosis not present

## 2020-07-22 DIAGNOSIS — I129 Hypertensive chronic kidney disease with stage 1 through stage 4 chronic kidney disease, or unspecified chronic kidney disease: Secondary | ICD-10-CM | POA: Diagnosis not present

## 2020-07-22 DIAGNOSIS — N189 Chronic kidney disease, unspecified: Secondary | ICD-10-CM | POA: Diagnosis not present

## 2020-07-22 DIAGNOSIS — Z4801 Encounter for change or removal of surgical wound dressing: Secondary | ICD-10-CM | POA: Diagnosis not present

## 2020-07-22 NOTE — Progress Notes (Signed)
[name], [name] ([id number]) Visit Report for 07/21/2020 Debridement Details Patient Name: Date of Service: [name], [name] 07/21/2020 9:30 A M Medical Record Number: [id number] Patient Account Number: [id number] Date of Birth/Sex: Treating RN: 1956/08/16 (64 y.o. [name] Primary Care Provider: [name] Other Clinician: Referring Provider: Treating Provider/Extender: [name] in Treatment: 4 Debridement Performed for Assessment: Wound #12 Right Metatarsal head first Performed By: Physician [name]., MD Debridement Type: Debridement Level of Consciousness (Pre-procedure): Awake and Alert Pre-procedure Verification/Time Out Yes - 10:16 Taken: Start Time: 10:16 T Area Debrided (L x W): otal 3 (cm) x 3.1 (cm) = 9.3 (cm) Tissue and other material debrided: Non-Viable, Callus, Skin: Epidermis Level: Skin/Epidermis Debridement Description: Selective/Open Wound Instrument: Blade Bleeding: Moderate Hemostasis Achieved: Silver Nitrate End Time: 10:17 Procedural Pain: 0 Post Procedural Pain: 0 Response to Treatment: Procedure was tolerated well Level of Consciousness (Post- Awake and Alert procedure): Post Debridement Measurements of Total Wound Length: (cm) 3 Width: (cm) 3.1 Depth: (cm) 0.6 Volume: (cm) 4.383 Character of Wound/Ulcer Post Debridement: Improved Post Procedure Diagnosis Same as Pre-procedure Electronic Signature(s) Signed: 07/21/2020 4:21:43 PM By: Levan Hurst RN, BSN Signed: 07/21/2020 4:33:08 PM By: [name] MD Entered By: [name] on 07/21/2020 10:47:50 -------------------------------------------------------------------------------- Debridement Details Patient Name: Date of Service: [name], [name] 07/21/2020 9:30 A M Medical Record Number: [id number] Patient Account Number: [id number] Date of Birth/Sex: Treating RN: [birth year] (64 y.o. [name] Primary Care Provider:  [name] Other Clinician: Referring Provider: Treating Provider/Extender: [name] in Treatment: 4 Debridement Performed for Assessment: Wound #13 Right T Second oe Performed By: Physician [name]., MD Debridement Type: Debridement Level of Consciousness (Pre-procedure): Awake and Alert Pre-procedure Verification/Time Out Yes - 10:16 Taken: Start Time: 10:16 T Area Debrided (L x W): otal 0.5 (cm) x 1 (cm) = 0.5 (cm) Tissue and other material debrided: Non-Viable, Callus Level: Non-Viable Tissue Debridement Description: Selective/Open Wound Instrument: Blade Bleeding: Minimum Hemostasis Achieved: Pressure End Time: 10:17 Procedural Pain: 0 Post Procedural Pain: 0 Response to Treatment: Procedure was tolerated well Level of Consciousness (Post- Awake and Alert procedure): Post Debridement Measurements of Total Wound Length: (cm) 0.3 Width: (cm) 0.3 Depth: (cm) 0.1 Volume: (cm) 0.007 Character of Wound/Ulcer Post Debridement: Improved Post Procedure Diagnosis Same as Pre-procedure Electronic Signature(s) Signed: 07/21/2020 4:21:43 PM By: Levan Hurst RN, BSN Signed: 07/21/2020 4:33:08 PM By: [name] MD Entered By: [name] on 07/21/2020 10:47:59 -------------------------------------------------------------------------------- HPI Details Patient Name: Date of Service: [name], [name] 07/21/2020 9:30 A M Medical Record Number: [id number] Patient Account Number: [id number] Date of Birth/Sex: Treating RN: 1956/06/28 (64 y.o. [name] Primary Care Provider: [name] Other Clinician: Referring Provider: Treating Provider/Extender: [name] in Treatment: 4 History of Present Illness Location: right first metatarsal head on the plantar aspect and the left big toe and the right fifth toe Quality: Patient reports No Pain. Severity: Patient states wound(s) are getting  worse. Duration: Patient has had the wound for > 24 months prior to seeking treatment at the wound center Context: The wound would happen gradually Modifying Factors: Wound improving due to current treatment. he has been under podiatry care for over a year and a half ssociated Signs and Symptoms: Patient reports having: heaviness in both legs A HPI Description: This 64 year old gentleman who has had a long-standing neuropathy of both feet without history of diabetes mellitus has never  been worked up for his neurological problem in the last 5 years or so. Most recently he has been seen by the podiatrist Dr. [name] who was been seeing them for about a year and a half and from what I understand has been treating him for bilateral ulceration on the feet, the right being in the region of the first metatarsal head plantar aspect and the left being in the area of the left big toe. He's had various antibiotics including Levaquin recently and this was then switched to Cipro and clindamycin. Due to the nature of this wound which has been there for a long while he has been recently referred to [name] for an opinion. An MRI was done in the middle of June which showed several small abscesses near the amputation site of the right great toe with draining and open wounds with surrounding cellulitis but no evidence of septic arthritis, osteomyelitis or pyomyositis. I understand at this stage he was taken to the operating room by [name] who debrided the wound and cleaned out the abscess. I understand ABIs were done bilaterally and they were within normal limits at Dr. [name] office. The patient is a smoker and is working on quitting smoking. 07/13/2017 -- had a lower extremity arterial duplex evaluation which showed patent bilateral lower extremity arteries without evidence of hemodynamically significant stenosis. His right ABI was 1.05 and the left was 0.95 He had a lower extremity venous duplex reflux  evaluation done which showed significant venous incompetence in the bilateral common femoral, saphenofemoral junction, small saphenous vein and in the right great saphenous vein. With these findings I believe he will benefit from a consultation with the vascular surgeon regarding the endovenous ablation procedure. the patient's neurology appointment and dermatology appointment is still pending. He has had a x-ray done by his PCP and the results are not available. However he did see [name] who I understand has ordered a bilateral MRI of his feet. 07/26/2017 -- was seen by [name] on 07/20/2017. His impression was that he presented with minimal bilateral lower extremity peripheral arterial disease, bilateral lower extremity chronic venous insufficiency (C4) and radiculopathy versus neuropathy bilateral lower extremity. No arterial intervention was needed at the present time. He recommended maximal medical management at this stage for his atherosclerotic disease . He recommended compression to be continued and no venous intervention at the present time. He was offered follow-up in the Vein clinic in 3 months time. MR of the right foot done with and without contrast -- IMPRESSION:Wound on the plantar surface of the foot at approximately the level of first MTP joint with a tiny underlying soft tissue abscess. Large area of soft tissue edema subjacent to the wound is most compatible with granulation tissue. Negative for osteomyelitis about the first MTP joint. Mild edema and enhancement in the proximal phalanx of the little toe is stable compared to the prior examination and may be due to stress change. Infection is possible but thought highly unlikely. Subcutaneous edema over the dorsum of the foot compatible with dependent change/cellulitis. the patient also has had a abdominal CT which shows a kidney mass and is going to have her MRI for this. He also has a urology opinion pending and  a general surgeon's opinion regarding a colonic mass. 08/02/2017 -- his appointments with his other urology and surgical consultants is still later. He has an MRI of the left foot later this week. 08/09/2017 --MR of the left foot with and without contrast --  IMPRESSION:Large appearing skin wound on the medial aspect of the great toe without underlying abscess or osteomyelitis.First MTP osteoarthritis. Marked marrow edema in the medial sesamoid bone is likely related to osteoarthritis but could be due to sesamoiditis. 08/16/2017 -- the patient has had a surgical opinion and a urological opinion and a extensive surgery including possible colon resection, bladder surgery and kidney surgery is being planned. No date has been set for his procedure He continues to be off smoking 08/31/2017 -- he was seen by the neurologist for his multiple problems of neuropathic and radicular symptoms in both legs with a thorough workup done. MRIs were reviewed lab work was reviewed and the assessment was that he had severe neuropathy due to multifactorial reasons and a neuropathy panel of lab work was ordered and he would be reviewed back. Patient is also due for colonoscopy to be repeated before his bladder and kidney surgery. At the present time he would like [name] to continue with conservative and symptomatic wound care for both his feet. 10/05/2017 -- since I have seen him last time he has had a colonoscopy which was fairly normal except for a polyp and he has a urology appointment pending later today. He still continues to stay off smoking. 10/26/2017-- he returns after about 3 weeks and in the meanwhile he was seen by Dr. Kellie Simmering, who reviewed his venous studies and noted that there was very minimal enlargement of the bilateral great saphenous vein on the right and somewhat larger vein on the left but no consistent reflux throughout the great saphenous veins and there was also some right small saphenous veins which  were enlarged with some reflux. However overall he thought that venous insufficiency was not significantly the cause of his ulceration and it was more of trophic ulcers and hence he recommended aggressive wound care, and at some stage he may need amputation of the left first toe and the right fifth toe. 11/09/2017 -- due to the inclement weather he has missed his urology appointment and is still awaiting the rescheduled one. Other than that he's been doing fairly well. 11/23/2017 -- he has had his cystoscopy done by his urologist and they found multiple areas of concern and he is going to be set up for a procedure sometime later in January. He is on complex care as far as his wounds go and seizures every 2-3 weeks 12/07/17 patient presents today for evaluation concerning his right great toe amputation site as well as his left great toe ulcer. He has been tolerating the dressing changes fairly well. With that being said there does not appear to be any significant discomfort at this point that he is experiencing although he has quite significant callous especially in regard to the amputation site of the right great toe. Nonetheless he has no evidence of infection which is good news No fevers, chills, nausea, or vomiting noted at this time. He has no pain. READMISSION 05/09/18; is a patient who hasn't been here in quite some time. He is not a diabetic but he has severe idiopathic peripheral neuropathy. He was here for review of a nonhealing wound on the right great toe amputation site and a large portion of the tip of the left great toe. Sometime after his last visit here he became ill he had abdominal issues infections and required hospitalizations. He largely dropped off the map. He is putting silver alginate on the wounds. I note that he had arterial studies and saw Dr. Bridgett Larsson of vascular surgery  was not felt to have an arterial issue. He also had reflux studies and he wears compression stockings and he  is faithful with these. He had MRI of both of his feet that not show osteomyelitis question small abscess on the right. He tells me that the nonhealing wound on his right great toe amputation site is been there since the actual amputation which was in 2014 I note the area was aggressively debrided by Dr. Jacqualyn Posey of podiatry before he started coming to this clinic apparently there were abscess is present at that time. His last formal arterial studies were done in August 2018 which time his ABI was 1.05 on the right and 0.95 on the left he had biphasic and triphasic waveforms on the left and monophasic and biphasic waveforms on the right. He did not have TBI's. ABIs in our clinic today were 0.8 bilaterally. The patient states he is not a diabetic. He does have idiopathic peripheral neuropathy. He is a smoker but is trying to quit with Chantix. 05/16/18; x-rays of his bilateral feet were negative for osteomyelitis. He was noted to have a chronic deformity of the head of the second metatarsal which may be due to previous osteonecrosis there was no definite underlying bone destruction to suggest osteomyelitis on either side. He came in with the extensive callus over his right great toe amputation site and the left great toe did extensive debridement last week and another extensive debridement today.patient is changing his dressing himself with silver alginate 05/30/18; still buildup of thick callus covering thick subcutaneous tissue around both of these areas. This requires an extensive debridement on both sides. I have discussed offloading this area with the patient. I think he inverts when he walks. His foot sizes beyond what we can put in a Darco forefoot off loader or probably what we can put in a total contact cast. 06/13/18; still a remarkable buildup of callus over the right first toe amputation site. The left first toe has 2 open wounds one medially and 1 at the tip of the toe. Surrounding callus is  also not back here. We've been using silver alginate. He has a darco forefoot off loader now on the right foot.his own shoe on the left foot. Would like to try a total contact cast on the right. He is making arrangements for someone to drive him here in 2 weeks. 06/29/18; still a buildup of callus over the right first toe amputation site although not as significant as previously. The left first toe has 2 open wounds one medially and one at the tip surrounding callus as well. We've been using silver alginate. He comes in today with the right for a total contact cast which I'll use on the right 07/04/18 on evaluation today patient appears to be doing decently well in regard to the cast. Unfortunately he did have a little area that rubbed on the lateral aspect of his leg the cast actually cracked on the medial aspect this happened he states just yesterday. He has not been walking in the cast without the boot portion on. With that being said he states that he is unsure of exactly what happened with the crack. Nonetheless this does seem to have caused a little bit of rubbing on the lateral aspect I think due to the integrity of the cast being compromised. Nonetheless I advised him that if this happens in the future he needs to let us know soon as possible. Nonetheless I do feel like the cast has  been of benefit some of the area on the distal portion of his great toe amputation site actually appears to be doing better. 07/12/18 on evaluation today patient appears to be doing very well in regard to his right great toe invitation site. This seems to be showing signs of excellent improvement which is great news. There does not appear to be any evidence of infection at this time. With that being said he has been tolerating the dressing changes without complication. There appears to be no evidence of infection and again this is barely open at this point. The total contact cast has been of excellent benefit for him. I  believe this will likely be completely closed and ready next week that we can switch to a cast on the left foot. He states he may want to take a week's break. 07/19/18 on evaluation today patient appears to be doing better in regard to both ulceration areas. The right foot actually appears to be completely healed he still has some callous but no open wound. The left foot/great toe actually seems to be better after last week's debridement overall the appearance of the wound is greatly improved. 08/09/18 on evaluation today patient actually appears to be doing a little bit worse in regard to the right metatarsal invitation site. He's been tolerating the dressing changes without complication. With that being said he does note that the area actually cracked open as of today when he was in the shower trying to scrub it. Nonetheless it does appear that the Caryn Section that previously was open and draining has subsequently reopened and is draining again. Nonetheless as I was looking at him and performing the debridement today I did inquire about whether or not we had ever biopsy the area as I was concerned about the possibility for something along the lines of a work type virus/infection causing the excessive callous buildup. Especially since he seems to grow much more than what would just be traditionally expected by friction alone. He subsequently told me that he did have this biopsied somewhere around 1-2 years ago by Dr. Jacqualyn Posey and that it showed that he had a wart infection at that point. Nonetheless he was never referred to dermatology better Jacqualyn Posey attempted to cut it out according to the patient. Nonetheless that may be part of the big issue with what we're dealing with here and without treating the wart infection we may not actually get this to heal and stay closed. READMISSION 12/07/2018 The patient is back in clinic today essentially with wounds in the same area and condition is that I think the last 2  times he has been here. This includes the right first metatarsal head in the setting of a previous right great toe amputation and a large part of the medial part of his left great toe. The patient states he has been dealing with this for years dating back to 2005 on and off. That he is never found this to be totally healed. When he was last in this clinic he had this biopsied shave biopsies which showed hyperkeratotic and parakeratotic debris. Epithelium with papillary features negative for malignancy. He has been using silver alginate. He has healing sandals which he uses Felt on the bottom of these. When he was here last time we put him a total contact cast at least for a while and the patient states on the right that helped with the buildup of hyperkeratotic tissue and that seems to be reflective in our notes. He also  states that was previously biopsied by Dr. Jacqualyn Posey of podiatry. The patient has a severe idiopathic peripheral neuropathy which he attributes to a brown recluse spider bite in 2002 or so. He is not a diabetic The patient had arterial studies in this showed an ABI in the right of 1.05 on the left was 0.95. TBI's were not done. He had monophasic waveforms at the right posterior tibial biphasic at the dorsalis pedis. On the left he was biphasic at the posterior tibial and triphasic at the dorsalis pedis. 1/17; this is a patient with a very challenging issue was readmitted to the clinic last week. He has a large area over the first right MTP in the setting of a previous right great toe amputation. On presentation this is covered with copious amounts of hyperkeratotic thick callus nonviable tissue. When this is debridement both on this occasion and previous occasions in this clinic he has a very boggy presentation to the subcutaneous tissue which looks somewhat atypical. He has a similar area on the left great toe from the tip of the toe down the medial aspect beyond the interphalangeal  joint. He's had previous MRIs of both of his feet. On the right that showed small abscesses and he was taken to the OR by podiatry for debridement and cleaning out of the abscesses. An MRI did not show osteomyelitis of the left first toe. Both the MRIs were done in 2018. He saw Dr. Vallarie Mare at the time who did not think he had significant macrovascular disease. He is not a diabetic but he has idiopathic peripheral neuropathy and was a significant smoker. When he was in the clinic earlier in 2019 he had biopsies done of both areas that showed hyperkeratotic material but no malignancy. X-rays I ordered last week did not show osteomyelitis on the left but did suggest osteomyelitis at the tip of the left great toe. I looked at the x-rays myself this is a subtle finding it is only seen on the lateral film. I did a very significant debridement on him last week bilaterally in both the wound areas look better. When he was previously in our clinic the area on the right did improve with a total contact cast. He dropped out of the schedule at that point I'm not exactly sure why 1/24; the biopsy I did of the area over his right first metatarsal head showed the possibility of verruca vulgaris. Superimposed changes of lichen simplex chronicus. If this is a plantar wart this would be extremely large. I think he would need topical destructive therapy and I am going to refer him to dermatology. There was no suggestion of malignancy. Previous biopsies in this clinic on both his areas did not show evidence of malignancy either. Until we get dermatology I am going to continue with silver alginate. X-rays suggested osteomyelitis of the left toe I am going to continue him on trimethoprim sulfamethoxazole 2/3; not much change in this area on the right first met head. Extensive dark eschar formation around the central area of nonviable subcutaneous tissue looking like a boggy subcutaneous tissue. Using a #10 scalpel and pickups I  removed all of this. I have tried this before and this man but I plan to put him in a total contact cast next week Using a #5 curette debridement of the left great toe He has an appointment with dermatology at [facility] on the 25th 2/11; right first metatarsal head after last week's extensive debridement is right back to the way it  was with a nonviable subcutaneous tissue and thick surrounding eschar. The left great toe looks about the same. The patient could not have a total contact cast today because he had household problems [leaking roof] but I am not going to put a cast on him until I have information from the dermatologist about this. READMISSION 06/17/2020 Mr. Boateng is now a 64 year old man that we have had in this clinic on at least 2 occasions previously. When he was last here in February 2020 I referred him to dermatology at [facility] for their review of large wounds on the right first metatarsal head with surrounding severe chronic hyperkeratosis. This was biopsied on at least 2 occasions in our clinic and I think at [facility] as well that did not show an underlying malignancy. I have reviewed the consult from Dr. Sharlette Dense dermatology from 01/23/2019. At that time the patient had wounds on the plantar right first metatarsal head as well as the plantar left great toe. From what he is saying the left great toe healed however he recently had to have a left TMA by Dr. 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 Dermatology at [facility] apparently referred him to Dr. Luetta Nutting of plastic surgery and an operative excision of this area was planned however apparently they ran into the pandemic. The procedure was repetitively cancel and the patient did not keep his final appointment which I think was earlier this year he was also seen in February of this year at the wound care center in [facility] by Dr. Zigmund Daniel. They were applying Silvadene cream and Xeroform which  she is essentially doing now. I see in his notes that there was still some concern about underlying malignancy although would be difficult to believe as long as this is been there that there would not be more extensive damage at this point in any case he is back in our clinic for review of this. He had an x-ray of the foot on 01/23/2020 that was negative for osteomyelitis acute fracture. Past medical history; idiopathic peripheral neuropathy, right great toe amputation in 2011, lumbar spinal stenosis, left transmetatarsal amputation on 05/30/2020 by Dr. Sharol Given and more recently apparently a slight wound dehiscence. He also had a fracture of his left tibial plateau sometime in 2020. ABI in our clinic was 0.95 8/9-Patient returns after being established in the clinic last time, he has a new open area on the plantar aspect of his right second toe, the right foot plantar ulcer below the great toe on the right looks slightly smaller, using PolyMem. Patient attends to his surgeon for the left foot wound 8/23; this is a patient with a refractory wound over his right first metatarsal head in the setting of severe surrounding hyperkeratosis. I had referred him to dermatology subsequently came to the attention of Dr. Vernona Rieger and Dr. Zigmund Daniel at [facility]. He also had a left TMA by Dr. Sharol Given on the left foot because of osteomyelitis I believe. He is in bilateral surgical shoes. Electronic Signature(s) Signed: 07/21/2020 4:33:08 PM By: [name] MD Entered By: [name] on 07/21/2020 10:49:50 -------------------------------------------------------------------------------- Physical Exam Details Patient Name: Date of Service: [name], [name] 07/21/2020 9:30 A M Medical Record Number: [id number] Patient Account Number: [id number] Date of Birth/Sex: Treating RN: Sep 12, 1956 (64 y.o. [name] Primary Care Provider: [name] Other Clinician: Referring Provider: Treating Provider/Extender: [name] in Treatment: 4 Constitutional Sitting or  standing Blood Pressure is within target range for patient.. Pulse regular and within target range for patient.Marland Kitchen Respirations regular, non-labored and within target range.. Temperature is normal and within the target range for the patient.Marland Kitchen Appears in no distress. Notes Wound exam; his wound did not look too bad on the right first metatarsal head. I did remove callus and nonviable skin from the circumference but I certainly seem worse here. Distally there is thick hyperkeratotic vericused tissue although there is no open wound here. She has a thick area of callus on the tip of his right second toe as well I remove this I am not even sure there is a wound here. Electronic Signature(s) Signed: 07/21/2020 4:33:08 PM By: [name] MD Entered By: [name] on 07/21/2020 10:51:10 -------------------------------------------------------------------------------- Physician Orders Details Patient Name: Date of Service: [name], [name] 07/21/2020 9:30 A M Medical Record Number: [id number] Patient Account Number: [id number] Date of Birth/Sex: Treating RN: 28-Oct-1956 (64 y.o. [name] Primary Care Provider: [name] Other Clinician: Referring Provider: Treating Provider/Extender: [name] in Treatment: 4 Verbal / Phone Orders: No Diagnosis Coding ICD-10 Coding Code Description L97.518 Non-pressure chronic ulcer of other part of right foot with other specified severity G90.09 Other idiopathic peripheral autonomic neuropathy Follow-up Appointments Return Appointment in 2 weeks. Dressing Change Frequency Wound #12 Right Metatarsal head first Change Dressing every other day. Wound #13 Right T Second oe Change Dressing every other day. Wound Cleansing Clean wound with Wound Cleanser - or normal saline Primary Wound Dressing Wound #12 Right Metatarsal head  first Polymem Silver Wound #13 Right T Second oe Polymem Silver Secondary Dressing Wound #12 Right Metatarsal head first Kerlix/Rolled Gauze Dry Gauze Other: - felt callous pad or foam donut Wound #13 Right T Second oe Kerlix/Rolled Gauze Dry Gauze Off-Loading Open toe surgical shoe to: - right foot [facility] for wound care. Alvis Lemmings Electronic Signature(s) Signed: 07/21/2020 4:21:43 PM By: Levan Hurst RN, BSN Signed: 07/21/2020 4:33:08 PM By: [name] MD Entered By: Levan Hurst on 07/21/2020 10:22:20 -------------------------------------------------------------------------------- Problem List Details Patient Name: Date of Service: [name], [name] 07/21/2020 9:30 A M Medical Record Number: [id number] Patient Account Number: [id number] Date of Birth/Sex: Treating RN: 1956-04-16 (64 y.o. [name] Primary Care Provider: [name] Other Clinician: Referring Provider: Treating Provider/Extender: [name] in Treatment: 4 Active Problems ICD-10 Encounter Code Description Active Date MDM Diagnosis L97.518 Non-pressure chronic ulcer of other part of right foot with other specified 06/17/2020 No Yes severity G90.09 Other idiopathic peripheral autonomic neuropathy 06/17/2020 No Yes Inactive Problems Resolved Problems Electronic Signature(s) Signed: 07/21/2020 4:33:08 PM By: [name] MD Entered By: [name] on 07/21/2020 10:47:18 -------------------------------------------------------------------------------- Progress Note Details Patient Name: Date of Service: [name], [name] 07/21/2020 9:30 A M Medical Record Number: [id number] Patient Account Number: [id number] Date of Birth/Sex: Treating RN: August 05, 1956 (64 y.o. [name] Primary Care Provider: [name] Other Clinician: Referring Provider: Treating Provider/Extender: [name] in Treatment: 4 Subjective History of Present Illness (HPI) The following HPI elements were documented for the patient's wound: Location: right first metatarsal head on the plantar aspect and the left big toe and the right fifth toe Quality: Patient reports No Pain. Severity: Patient states wound(s) are getting worse. Duration: Patient has had the wound for > 24 months prior to seeking treatment at the wound center Context: The wound  would happen gradually Modifying Factors: Wound improving due to current treatment. he has been under podiatry care for over a year and a half Associated Signs and Symptoms: Patient reports having: heaviness in both legs This 64 year old gentleman who has had a long-standing neuropathy of both feet without history of diabetes mellitus has never been worked up for his neurological problem in the last 5 years or so. Most recently he has been seen by the podiatrist Dr. [name] who was been seeing them for about a year and a half and from what I understand has been treating him for bilateral ulceration on the feet, the right being in the region of the first metatarsal head plantar aspect and the left being in the area of the left big toe. He's had various antibiotics including Levaquin recently and this was then switched to Cipro and clindamycin. Due to the nature of this wound which has been there for a long while he has been recently referred to [name] for an opinion. An MRI was done in the middle of June which showed several small abscesses near the amputation site of the right great toe with draining and open wounds with surrounding cellulitis but no evidence of septic arthritis, osteomyelitis or pyomyositis. I understand at this stage he was taken to the operating room by [name] who debrided the wound and cleaned out the abscess. I understand ABIs were done bilaterally and they were within normal limits at Dr. [name]  office. The patient is a smoker and is working on quitting smoking. 07/13/2017 -- had a lower extremity arterial duplex evaluation which showed patent bilateral lower extremity arteries without evidence of hemodynamically significant stenosis. His right ABI was 1.05 and the left was 0.95 He had a lower extremity venous duplex reflux evaluation done which showed significant venous incompetence in the bilateral common femoral, saphenofemoral junction, small saphenous vein and in the right great saphenous vein. With these findings I believe he will benefit from a consultation with the vascular surgeon regarding the endovenous ablation procedure. the patient's neurology appointment and dermatology appointment is still pending. He has had a x-ray done by his PCP and the results are not available. However he did see [name] who I understand has ordered a bilateral MRI of his feet. 07/26/2017 -- was seen by [name] on 07/20/2017. His impression was that he presented with minimal bilateral lower extremity peripheral arterial disease, bilateral lower extremity chronic venous insufficiency (C4) and radiculopathy versus neuropathy bilateral lower extremity. No arterial intervention was needed at the present time. He recommended maximal medical management at this stage for his atherosclerotic disease . He recommended compression to be continued and no venous intervention at the present time. He was offered follow-up in the Vein clinic in 3 months time. MR of the right foot done with and without contrast -- IMPRESSION:Wound on the plantar surface of the foot at approximately the level of first MTP joint with a tiny underlying soft tissue abscess. Large area of soft tissue edema subjacent to the wound is most compatible with granulation tissue. Negative for osteomyelitis about the first MTP joint. Mild edema and enhancement in the proximal phalanx of the little toe is stable compared to the  prior examination and may be due to stress change. Infection is possible but thought highly unlikely. Subcutaneous edema over the dorsum of the foot compatible with dependent change/cellulitis. the patient also has had a abdominal CT which shows a kidney mass and is going to have her  MRI for this. He also has a urology opinion pending and a general surgeon's opinion regarding a colonic mass. 08/02/2017 -- his appointments with his other urology and surgical consultants is still later. He has an MRI of the left foot later this week. 08/09/2017 --MR of the left foot with and without contrast -- IMPRESSION:Large appearing skin wound on the medial aspect of the great toe without underlying abscess or osteomyelitis.First MTP osteoarthritis. Marked marrow edema in the medial sesamoid bone is likely related to osteoarthritis but could be due to sesamoiditis. 08/16/2017 -- the patient has had a surgical opinion and a urological opinion and a extensive surgery including possible colon resection, bladder surgery and kidney surgery is being planned. No date has been set for his procedure He continues to be off smoking 08/31/2017 -- he was seen by the neurologist for his multiple problems of neuropathic and radicular symptoms in both legs with a thorough workup done. MRIs were reviewed lab work was reviewed and the assessment was that he had severe neuropathy due to multifactorial reasons and a neuropathy panel of lab work was ordered and he would be reviewed back. Patient is also due for colonoscopy to be repeated before his bladder and kidney surgery. At the present time he would like [name] to continue with conservative and symptomatic wound care for both his feet. 10/05/2017 -- since I have seen him last time he has had a colonoscopy which was fairly normal except for a polyp and he has a urology appointment pending later today. He still continues to stay off smoking. 10/26/2017-- he returns after about 3  weeks and in the meanwhile he was seen by Dr. Kellie Simmering, who reviewed his venous studies and noted that there was very minimal enlargement of the bilateral great saphenous vein on the right and somewhat larger vein on the left but no consistent reflux throughout the great saphenous veins and there was also some right small saphenous veins which were enlarged with some reflux. However overall he thought that venous insufficiency was not significantly the cause of his ulceration and it was more of trophic ulcers and hence he recommended aggressive wound care, and at some stage he may need amputation of the left first toe and the right fifth toe. 11/09/2017 -- due to the inclement weather he has missed his urology appointment and is still awaiting the rescheduled one. Other than that he's been doing fairly well. 11/23/2017 -- he has had his cystoscopy done by his urologist and they found multiple areas of concern and he is going to be set up for a procedure sometime later in January. He is on complex care as far as his wounds go and seizures every 2-3 weeks 12/07/17 patient presents today for evaluation concerning his right great toe amputation site as well as his left great toe ulcer. He has been tolerating the dressing changes fairly well. With that being said there does not appear to be any significant discomfort at this point that he is experiencing although he has quite significant callous especially in regard to the amputation site of the right great toe. Nonetheless he has no evidence of infection which is good news No fevers, chills, nausea, or vomiting noted at this time. He has no pain. READMISSION 05/09/18; is a patient who hasn't been here in quite some time. He is not a diabetic but he has severe idiopathic peripheral neuropathy. He was here for review of a nonhealing wound on the right great toe amputation site and a  large portion of the tip of the left great toe. Sometime after his last visit  here he became ill he had abdominal issues infections and required hospitalizations. He largely dropped off the map. He is putting silver alginate on the wounds. I note that he had arterial studies and saw Dr. Bridgett Larsson of vascular surgery was not felt to have an arterial issue. He also had reflux studies and he wears compression stockings and he is faithful with these. He had MRI of both of his feet that not show osteomyelitis question small abscess on the right. He tells me that the nonhealing wound on his right great toe amputation site is been there since the actual amputation which was in 2014 I note the area was aggressively debrided by Dr. Jacqualyn Posey of podiatry before he started coming to this clinic apparently there were abscess is present at that time. His last formal arterial studies were done in August 2018 which time his ABI was 1.05 on the right and 0.95 on the left he had biphasic and triphasic waveforms on the left and monophasic and biphasic waveforms on the right. He did not have TBI's. ABIs in our clinic today were 0.8 bilaterally. The patient states he is not a diabetic. He does have idiopathic peripheral neuropathy. He is a smoker but is trying to quit with Chantix. 05/16/18; x-rays of his bilateral feet were negative for osteomyelitis. He was noted to have a chronic deformity of the head of the second metatarsal which may be due to previous osteonecrosis there was no definite underlying bone destruction to suggest osteomyelitis on either side. He came in with the extensive callus over his right great toe amputation site and the left great toe did extensive debridement last week and another extensive debridement today.patient is changing his dressing himself with silver alginate 05/30/18; still buildup of thick callus covering thick subcutaneous tissue around both of these areas. This requires an extensive debridement on both sides. I have discussed offloading this area with the patient. I  think he inverts when he walks. His foot sizes beyond what we can put in a Darco forefoot off loader or probably what we can put in a total contact cast. 06/13/18; still a remarkable buildup of callus over the right first toe amputation site. The left first toe has 2 open wounds one medially and 1 at the tip of the toe. Surrounding callus is also not back here. We've been using silver alginate. He has a darco forefoot off loader now on the right foot.his own shoe on the left foot. Would like to try a total contact cast on the right. He is making arrangements for someone to drive him here in 2 weeks. 06/29/18; still a buildup of callus over the right first toe amputation site although not as significant as previously. The left first toe has 2 open wounds one medially and one at the tip surrounding callus as well. We've been using silver alginate. He comes in today with the right for a total contact cast which I'll use on the right 07/04/18 on evaluation today patient appears to be doing decently well in regard to the cast. Unfortunately he did have a little area that rubbed on the lateral aspect of his leg the cast actually cracked on the medial aspect this happened he states just yesterday. He has not been walking in the cast without the boot portion on. With that being said he states that he is unsure of exactly what happened with the crack.  Nonetheless this does seem to have caused a little bit of rubbing on the lateral aspect I think due to the integrity of the cast being compromised. Nonetheless I advised him that if this happens in the future he needs to let us know soon as possible. Nonetheless I do feel like the cast has been of benefit some of the area on the distal portion of his great toe amputation site actually appears to be doing better. 07/12/18 on evaluation today patient appears to be doing very well in regard to his right great toe invitation site. This seems to be showing signs of  excellent improvement which is great news. There does not appear to be any evidence of infection at this time. With that being said he has been tolerating the dressing changes without complication. There appears to be no evidence of infection and again this is barely open at this point. The total contact cast has been of excellent benefit for him. I believe this will likely be completely closed and ready next week that we can switch to a cast on the left foot. He states he may want to take a week's break. 07/19/18 on evaluation today patient appears to be doing better in regard to both ulceration areas. The right foot actually appears to be completely healed he still has some callous but no open wound. The left foot/great toe actually seems to be better after last week's debridement overall the appearance of the wound is greatly improved. 08/09/18 on evaluation today patient actually appears to be doing a little bit worse in regard to the right metatarsal invitation site. He's been tolerating the dressing changes without complication. With that being said he does note that the area actually cracked open as of today when he was in the shower trying to scrub it. Nonetheless it does appear that the Caryn Section that previously was open and draining has subsequently reopened and is draining again. Nonetheless as I was looking at him and performing the debridement today I did inquire about whether or not we had ever biopsy the area as I was concerned about the possibility for something along the lines of a work type virus/infection causing the excessive callous buildup. Especially since he seems to grow much more than what would just be traditionally expected by friction alone. He subsequently told me that he did have this biopsied somewhere around 1-2 years ago by Dr. Jacqualyn Posey and that it showed that he had a wart infection at that point. Nonetheless he was never referred to dermatology better Jacqualyn Posey attempted to  cut it out according to the patient. Nonetheless that may be part of the big issue with what we're dealing with here and without treating the wart infection we may not actually get this to heal and stay closed. READMISSION 12/07/2018 The patient is back in clinic today essentially with wounds in the same area and condition is that I think the last 2 times he has been here. This includes the right first metatarsal head in the setting of a previous right great toe amputation and a large part of the medial part of his left great toe. The patient states he has been dealing with this for years dating back to 2005 on and off. That he is never found this to be totally healed. When he was last in this clinic he had this biopsied shave biopsies which showed hyperkeratotic and parakeratotic debris. Epithelium with papillary features negative for malignancy. He has been using silver alginate. He has  healing sandals which he uses Felt on the bottom of these. When he was here last time we put him a total contact cast at least for a while and the patient states on the right that helped with the buildup of hyperkeratotic tissue and that seems to be reflective in our notes. He also states that was previously biopsied by Dr. Jacqualyn Posey of podiatry. The patient has a severe idiopathic peripheral neuropathy which he attributes to a brown recluse spider bite in 2002 or so. He is not a diabetic The patient had arterial studies in this showed an ABI in the right of 1.05 on the left was 0.95. TBI's were not done. He had monophasic waveforms at the right posterior tibial biphasic at the dorsalis pedis. On the left he was biphasic at the posterior tibial and triphasic at the dorsalis pedis. 1/17; this is a patient with a very challenging issue was readmitted to the clinic last week. He has a large area over the first right MTP in the setting of a previous right great toe amputation. On presentation this is covered with copious  amounts of hyperkeratotic thick callus nonviable tissue. When this is debridement both on this occasion and previous occasions in this clinic he has a very boggy presentation to the subcutaneous tissue which looks somewhat atypical. He has a similar area on the left great toe from the tip of the toe down the medial aspect beyond the interphalangeal joint. He's had previous MRIs of both of his feet. On the right that showed small abscesses and he was taken to the OR by podiatry for debridement and cleaning out of the abscesses. An MRI did not show osteomyelitis of the left first toe. Both the MRIs were done in 2018. He saw Dr. Vallarie Mare at the time who did not think he had significant macrovascular disease. He is not a diabetic but he has idiopathic peripheral neuropathy and was a significant smoker. When he was in the clinic earlier in 2019 he had biopsies done of both areas that showed hyperkeratotic material but no malignancy. X-rays I ordered last week did not show osteomyelitis on the left but did suggest osteomyelitis at the tip of the left great toe. I looked at the x-rays myself this is a subtle finding it is only seen on the lateral film. I did a very significant debridement on him last week bilaterally in both the wound areas look better. When he was previously in our clinic the area on the right did improve with a total contact cast. He dropped out of the schedule at that point I'm not exactly sure why 1/24; the biopsy I did of the area over his right first metatarsal head showed the possibility of verruca vulgaris. Superimposed changes of lichen simplex chronicus. If this is a plantar wart this would be extremely large. I think he would need topical destructive therapy and I am going to refer him to dermatology. There was no suggestion of malignancy. Previous biopsies in this clinic on both his areas did not show evidence of malignancy either. Until we get dermatology I am going to continue with  silver alginate. X-rays suggested osteomyelitis of the left toe I am going to continue him on trimethoprim sulfamethoxazole 2/3; not much change in this area on the right first met head. Extensive dark eschar formation around the central area of nonviable subcutaneous tissue looking like a boggy subcutaneous tissue. Using a #10 scalpel and pickups I removed all of this. I have tried this  before and this man but I plan to put him in a total contact cast next week ooUsing a #5 curette debridement of the left great toe ooHe has an appointment with dermatology at [facility] on the 25th 2/11; right first metatarsal head after last week's extensive debridement is right back to the way it was with a nonviable subcutaneous tissue and thick surrounding eschar. The left great toe looks about the same. The patient could not have a total contact cast today because he had household problems [leaking roof] but I am not going to put a cast on him until I have information from the dermatologist about this. READMISSION 06/17/2020 Mr. Towell is now a 64 year old man that we have had in this clinic on at least 2 occasions previously. When he was last here in February 2020 I referred him to dermatology at [facility] for their review of large wounds on the right first metatarsal head with surrounding severe chronic hyperkeratosis. This was biopsied on at least 2 occasions in our clinic and I think at [facility] as well that did not show an underlying malignancy. I have reviewed the consult from Dr. Sharlette Dense dermatology from 01/23/2019. At that time the patient had wounds on the plantar right first metatarsal head as well as the plantar left great toe. From what he is saying the left great toe healed however he recently had to have a left TMA by Dr. 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 Dermatology at [facility] apparently referred him to Dr. Luetta Nutting of plastic surgery and an  operative excision of this area was planned however apparently they ran into the pandemic. The procedure was repetitively cancel and the patient did not keep his final appointment which I think was earlier this year he was also seen in February of this year at the wound care center in [facility] by Dr. Zigmund Daniel. They were applying Silvadene cream and Xeroform which she is essentially doing now. I see in his notes that there was still some concern about underlying malignancy although would be difficult to believe as long as this is been there that there would not be more extensive damage at this point in any case he is back in our clinic for review of this. He had an x-ray of the foot on 01/23/2020 that was negative for osteomyelitis acute fracture. Past medical history; idiopathic peripheral neuropathy, right great toe amputation in 2011, lumbar spinal stenosis, left transmetatarsal amputation on 05/30/2020 by Dr. Sharol Given and more recently apparently a slight wound dehiscence. He also had a fracture of his left tibial plateau sometime in 2020. ABI in our clinic was 0.95 8/9-Patient returns after being established in the clinic last time, he has a new open area on the plantar aspect of his right second toe, the right foot plantar ulcer below the great toe on the right looks slightly smaller, using PolyMem. Patient attends to his surgeon for the left foot wound 8/23; this is a patient with a refractory wound over his right first metatarsal head in the setting of severe surrounding hyperkeratosis. I had referred him to dermatology subsequently came to the attention of Dr. Vernona Rieger and Dr. Zigmund Daniel at [facility]. He also had a left TMA by Dr. Sharol Given on the left foot because of osteomyelitis I believe. He is in bilateral surgical shoes. Objective Constitutional Sitting or standing Blood Pressure is within target range for patient.. Pulse regular and within target range for patient.Marland Kitchen Respirations regular,  non-labored and within  target range.. Temperature is normal and within the target range for the patient.Marland Kitchen Appears in no distress. Vitals Time Taken: 9:54 AM, Height: 80 in, Weight: 350 lbs, BMI: 38.4, Temperature: 98.1 F, Pulse: 74 bpm, Respiratory Rate: 18 breaths/min, Blood Pressure: 146/82 mmHg. General Notes: Wound exam; his wound did not look too bad on the right first metatarsal head. I did remove callus and nonviable skin from the circumference but I certainly seem worse here. Distally there is thick hyperkeratotic vericused tissue although there is no open wound here. ooShe has a thick area of callus on the tip of his right second toe as well I remove this I am not even sure there is a wound here. Integumentary (Hair, Skin) Wound #12 status is Open. Original cause of wound was Gradually Appeared. The wound is located on the Right Metatarsal head first. The wound measures 3cm length x 3.1cm width x 0.6cm depth; 7.304cm^2 area and 4.383cm^3 volume. There is Fat Layer (Subcutaneous Tissue) exposed. There is no tunneling or undermining noted. There is a medium amount of serosanguineous drainage noted. The wound margin is thickened. There is large (67-100%) red granulation within the wound bed. There is no necrotic tissue within the wound bed. Wound #13 status is Open. Original cause of wound was Gradually Appeared. The wound is located on the Right T Second. The wound measures 0.3cm oe length x 0.3cm width x 0.1cm depth; 0.071cm^2 area and 0.007cm^3 volume. There is no tunneling or undermining noted. There is a none present amount of drainage noted. The wound margin is distinct with the outline attached to the wound base. There is no granulation within the wound bed. There is no necrotic tissue within the wound bed. Assessment Active Problems ICD-10 Non-pressure chronic ulcer of other part of right foot with other specified severity Other idiopathic peripheral autonomic neuropathy Procedures Wound  #12 Pre-procedure diagnosis of Wound #12 is a Neuropathic Ulcer-Non Diabetic located on the Right Metatarsal head first . There was a Selective/Open Wound Skin/Epidermis Debridement with a total area of 9.3 sq cm performed by [name]., MD. With the following instrument(s): Blade to remove Non- Viable tissue/material. Material removed includes Callus and Skin: Epidermis and. No specimens were taken. A time out was conducted at 10:16, prior to the start of the procedure. A Moderate amount of bleeding was controlled with Silver Nitrate. The procedure was tolerated well with a pain level of 0 throughout and a pain level of 0 following the procedure. Post Debridement Measurements: 3cm length x 3.1cm width x 0.6cm depth; 4.383cm^3 volume. Character of Wound/Ulcer Post Debridement is improved. Post procedure Diagnosis Wound #12: Same as Pre-Procedure Wound #13 Pre-procedure diagnosis of Wound #13 is a Neuropathic Ulcer-Non Diabetic located on the Right T Second . There was a Selective/Open Wound Non-Viable oe Tissue Debridement with a total area of 0.5 sq cm performed by [name]., MD. With the following instrument(s): Blade to remove Non-Viable tissue/material. Material removed includes Callus. No specimens were taken. A time out was conducted at 10:16, prior to the start of the procedure. A Minimum amount of bleeding was controlled with Pressure. The procedure was tolerated well with a pain level of 0 throughout and a pain level of 0 following the procedure. Post Debridement Measurements: 0.3cm length x 0.3cm width x 0.1cm depth; 0.007cm^3 volume. Character of Wound/Ulcer Post Debridement is improved. Post procedure Diagnosis Wound #13: Same as Pre-Procedure Plan Follow-up Appointments: Return Appointment in 2 weeks. Dressing Change Frequency: Wound #  12 Right Metatarsal head first: Change Dressing every other day. Wound #13 Right T Second: oe Change Dressing every other  day. Wound Cleansing: Clean wound with Wound Cleanser - or normal saline Primary Wound Dressing: Wound #12 Right Metatarsal head first: Polymem Silver Wound #13 Right T Second: oe Polymem Silver Secondary Dressing: Wound #12 Right Metatarsal head first: Kerlix/Rolled Gauze Dry Gauze Other: - felt callous pad or foam donut Wound #13 Right T Second: oe Kerlix/Rolled Gauze Dry Gauze Off-Loading: Open toe surgical shoe to: - right foot Home Health: [facility]. - [facility]. Using polymen silver to the right first metatarsal head and right second toe 2. I am not even certain there is a wound on the right second toe I removed this much of the thick callus tissue as I could. 3. I will see if I can look back over the dermatology notes from [facility] The patient says that they put topical treatments on back in February. I do not know what they thought this was in terms of an exact diagnosis. Electronic Signature(s) Signed: 07/21/2020 4:33:08 PM By: [name] MD Entered By: [name] on 07/21/2020 10:52:34 -------------------------------------------------------------------------------- SuperBill Details Patient Name: Date of Service: [name], [name] 07/21/2020 Medical Record Number: [id number] Patient Account Number: [id number] Date of Birth/Sex: Treating RN: 1956-02-08 (64 y.o. [name] Primary Care Provider: [name] Other Clinician: Referring Provider: Treating Provider/Extender: [name] in Treatment: 4 Diagnosis Coding ICD-10 Codes Code Description [phone] Non-pressure chronic ulcer of other part of right foot with other specified severity G90.09 Other idiopathic peripheral autonomic neuropathy Facility Procedures CPT4 Code: [id number] IC L Description: 82500 - DEBRIDE WOUND 1ST 20 SQ CM OR < D-10 Diagnosis Description 97.518 Non-pressure chronic ulcer of other part of right  foot with other specified severity Modifier: Quantity: 1 Physician Procedures : CPT4 Code Description Modifier [id number] - WC PHYS DEBR WO ANESTH 20 SQ CM ICD-10 Diagnosis Description L97.518 Non-pressure chronic ulcer of other part of right foot with other specified severity Quantity: 1 Electronic Signature(s) Signed: 07/21/2020 4:33:08 PM By: [name] MD Entered By: [name] on 07/21/2020 10:52:48

## 2020-07-22 NOTE — Progress Notes (Signed)
[name], [name] ([id number]) Visit Report for 07/21/2020 Arrival Information Details Patient Name: Date of Service: [name], [name] 07/21/2020 9:30 A M Medical Record Number: [id number] Patient Account Number: [id number] Date of Birth/Sex: Treating RN: 1956/07/15 (64 y.o. [name] Primary Care [name]: [name] Other Clinician: Referring [name]: Treating [name]/Extender: [name] in Treatment: 4 Visit Information History Since Last Visit Added or deleted any medications: No Patient Arrived: Crutches Any new allergies or adverse reactions: No Arrival Time: 09:53 Had a fall or experienced change in No Accompanied By: self activities of daily living that may affect Transfer Assistance: None risk of falls: Patient Identification Verified: Yes Signs or symptoms of abuse/neglect since last visito No Secondary Verification Process Completed: Yes Hospitalized since last visit: No Patient Requires Transmission-Based Precautions: No Implantable device outside of the clinic excluding No Patient Has Alerts: No cellular tissue based products placed in the center since last visit: Has Dressing in Place as Prescribed: Yes Pain Present Now: No Electronic Signature(s) Signed: 07/21/2020 4:06:40 PM By: [name] Entered By: [name] on 07/21/2020 09:55:13 -------------------------------------------------------------------------------- Lower Extremity Assessment Details Patient Name: Date of Service: [name], [name] 07/21/2020 9:30 A M Medical Record Number: [id number] Patient Account Number: [id number] Date of Birth/Sex: Treating RN: 11/30/1955 (64 y.o. [name] Primary Care [name]: [name] Other Clinician: Referring [name]: Treating [name]/Extender: [name] in Treatment: 4 Edema Assessment Assessed: [Left: No] [Right: No] Edema: [Left: Ye] [Right:  s] Calf Left: Right: Point of Measurement: cm From Medial Instep cm 44.5 cm Ankle Left: Right: Point of Measurement: cm From Medial Instep cm 26.5 cm Vascular Assessment Pulses: Dorsalis Pedis Palpable: [Right:No] Electronic Signature(s) Signed: 07/21/2020 4:06:40 PM By: [name] Entered By: [name] on 07/21/2020 09:59:31 -------------------------------------------------------------------------------- Multi Wound Chart Details Patient Name: Date of Service: [name], [name] 07/21/2020 9:30 A M Medical Record Number: [id number] Patient Account Number: [id number] Date of Birth/Sex: Treating RN: [birth year] (65 y.o. [name] Primary Care [name]: [name] Other Clinician: Referring Janiya Millirons: Treating [name]/Extender: [name] in Treatment: 4 Vital Signs Height(in): 80 Pulse(bpm): 69 Weight(lbs): 350 Blood Pressure(mmHg): 146/82 Body Mass Index(BMI): 38 Temperature(F): 98.1 Respiratory Rate(breaths/min): 18 Photos: [12:No Photos Right Metatarsal head first] [13:No 70 Right T Second oe] [N/A:N/A N/A] Wound Location: [12:Gradually Appeared] [13:Gradually Appeared] [N/A:N/A] Wounding Event: [12:Neuropathic Ulcer-Non Diabetic] [13:Neuropathic Ulcer-Non Diabetic] [N/A:N/A] Primary Etiology: [12:Anemia, Chronic Obstructive] [13:Anemia, Chronic Obstructive] [N/A:N/A] Comorbid History: [12:Pulmonary Disease (COPD), Hypertension, Osteoarthritis, Osteomyelitis, Neuropathy 11/29/2018] [13:Pulmonary Disease (COPD), Hypertension, Osteoarthritis, Osteomyelitis, Neuropathy 07/04/2020] [N/A:N/A] Date Acquired: [12:4] [13:2] [N/A:N/A] Weeks of Treatment: [12:Open] [13:Open] [N/A:N/A] Wound Status: [12:3x3.1x0.6] [13:0.3x0.3x0.1] [N/A:N/A] Measurements L x W x D (cm) [12:7.304] [13:0.071] [N/A:N/A] A (cm) : rea [12:4.383] [13:0.007] [N/A:N/A] Volume (cm) : [12:21.70%] [13:79.90%] [N/A:N/A] % Reduction in A [12:rea:  -134.90%] [13:95.00%] [N/A:N/A] % Reduction in Volume: [12:Full Thickness Without Exposed] [13:Full Thickness Without Exposed] [N/A:N/A] Classification: [12:Support Structures Medium] [13:Support Structures None Present] [N/A:N/A] Exudate A mount: [12:Serosanguineous] [13:N/A] [N/A:N/A] Exudate Type: [12:red, brown] [13:N/A] [N/A:N/A] Exudate Color: [12:Thickened] [13:Distinct, outline attached] [N/A:N/A] Wound Margin: [12:Large (67-100%)] [13:None Present (0%)] [N/A:N/A] Granulation A mount: [12:Red] [13:N/A] [N/A:N/A] Granulation Quality: [12:None Present (0%)] [13:None Present (0%)] [N/A:N/A] Necrotic A mount: [12:Fat Layer (Subcutaneous Tissue): Yes Fascia: No] [N/A:N/A] Exposed Structures: [12:Fascia: No Tendon: No Muscle: No Joint: No Bone: No Small (1-33%)] [13:Fat Layer (Subcutaneous Tissue): No Tendon: No Muscle: No Joint: No Bone: No Large (67-100%)] [  N/A:N/A] Epithelialization: [12:Debridement - Selective/Open Wound Debridement - Selective/Open Wound] [N/A:N/A] Debridement: Pre-procedure Verification/Time Out 10:16 [13:10:16] [N/A:N/A] Taken: [12:Callus] [13:Callus] [N/A:N/A] Tissue Debrided: [12:Non-Viable Tissue] [13:Non-Viable Tissue] [N/A:N/A] Level: [12:9.3] [13:0.5] [N/A:N/A] Debridement A (sq cm): [12:rea Blade] [13:Blade] [N/A:N/A] Instrument: [12:Moderate] [13:Minimum] [N/A:N/A] Bleeding: [12:Silver Nitrate] [13:Pressure] [N/A:N/A] Hemostasis A chieved: [12:0] [13:0] [N/A:N/A] Procedural Pain: [12:0] [13:0] [N/A:N/A] Post Procedural Pain: [12:Procedure was tolerated well] [13:Procedure was tolerated well] [N/A:N/A] Debridement Treatment Response: [12:3x3.1x0.6] [13:0.3x0.3x0.1] [N/A:N/A] Post Debridement Measurements L x W x D (cm) [12:4.383] [13:0.007] [N/A:N/A] Post Debridement Volume: (cm) [12:Debridement] [13:Debridement] [N/A:N/A] Procedures Performed: Treatment Notes Electronic Signature(s) Signed: 07/21/2020 4:21:43 PM By: Levan Hurst RN, BSN Signed:  07/21/2020 4:33:08 PM By: [name] MD Entered By: [name] on 07/21/2020 10:47:29 -------------------------------------------------------------------------------- Multi-Disciplinary Care Plan Details Patient Name: Date of Service: [name], [name] 07/21/2020 9:30 A M Medical Record Number: [id number] Patient Account Number: [id number] Date of Birth/Sex: Treating RN: [birth year] (64 y.o. [name] Primary Care Briannia Laba: [name] Other Clinician: Referring Breydon Senters: Treating Cicely Ortner/Extender: [name] in Treatment: 4 Active Inactive Wound/Skin Impairment Nursing Diagnoses: Impaired tissue integrity Knowledge deficit related to ulceration/compromised skin integrity Goals: Patient/caregiver will verbalize understanding of skin care regimen Date Initiated: 06/17/2020 Target Resolution Date: 08/22/2020 Goal Status: Active Ulcer/skin breakdown will have a volume reduction of 30% by week 4 Date Initiated: 06/17/2020 Date Inactivated: 07/21/2020 Target Resolution Date: 07/18/2020 Goal Status: Met Interventions: Assess patient/caregiver ability to obtain necessary supplies Assess patient/caregiver ability to perform ulcer/skin care regimen upon admission and as needed Assess ulceration(s) every visit Provide education on ulcer and skin care Notes: Electronic Signature(s) Signed: 07/21/2020 4:21:43 PM By: Levan Hurst RN, BSN Entered By: Levan Hurst on 07/21/2020 11:47:50 -------------------------------------------------------------------------------- Pain Assessment Details Patient Name: Date of Service: [name], [name] 07/21/2020 9:30 A M Medical Record Number: [id number] Patient Account Number: [id number] Date of Birth/Sex: Treating RN: 1956/08/06 (64 y.o. [name] Primary Care Rohn Fritsch: [name] Other Clinician: Referring Cheryle Dark: Treating Kacee Koren/Extender: [name] in Treatment: 4 Active Problems Location of Pain Severity and Description of Pain Patient Has Paino No Site Locations Pain Management and Medication Current Pain Management: Electronic Signature(s) Signed: 07/21/2020 4:06:40 PM By: [name] Entered By: [name] on 07/21/2020 09:55:21 -------------------------------------------------------------------------------- Patient/Caregiver Education Details Patient Name: Date of Service: [name], [name] 8/23/2021andnbsp9:30 A M Medical Record Number: [id number] Patient Account Number: [id number] Date of Birth/Gender: Treating RN: 1955/12/13 (64 y.o. [name] Primary Care Physician: [name] Other Clinician: Referring Physician: Treating Physician/Extender: [name] in Treatment: 4 Education Assessment Education Provided To: Patient Education Topics Provided Wound/Skin Impairment: Methods: Explain/Verbal Responses: State content correctly Electronic Signature(s) Signed: 07/21/2020 4:21:43 PM By: Levan Hurst RN, BSN Entered By: Levan Hurst on 07/21/2020 11:48:06 -------------------------------------------------------------------------------- Wound Assessment Details Patient Name: Date of Service: [name], [name] 07/21/2020 9:30 A M Medical Record Number: [id number] Patient Account Number: [id number] Date of Birth/Sex: Treating RN: [birth year] (64 y.o. [name] Primary Care Danee Soller: [name] Other Clinician: Referring Ravina Milner: Treating Justin Buechner/Extender: [name] in Treatment: 4 Wound Status Wound Number: 12 Primary Neuropathic Ulcer-Non Diabetic Etiology: Wound Location: Right Metatarsal head first Wound Open Wounding Event: Gradually Appeared Status: Date Acquired: 11/29/2018 Comorbid Anemia, Chronic Obstructive Pulmonary Disease (COPD), Weeks Of Treatment: 4 History: Hypertension,  Osteoarthritis, Osteomyelitis, Neuropathy Clustered Wound: No Wound Measurements Length: (cm) 3 Width: (cm) 3.1 Depth: (cm) 0.6  Area: (cm) 7.304 Volume: (cm) 4.383 % Reduction in Area: 21.7% % Reduction in Volume: -134.9% Epithelialization: Small (1-33%) Tunneling: No Undermining: No Wound Description Classification: Full Thickness Without Exposed Support Structures Wound Margin: Thickened Exudate Amount: Medium Exudate Type: Serosanguineous Exudate Color: red, brown Foul Odor After Cleansing: No Slough/Fibrino No Wound Bed Granulation Amount: Large (67-100%) Exposed Structure Granulation Quality: Red Fascia Exposed: No Necrotic Amount: None Present (0%) Fat Layer (Subcutaneous Tissue) Exposed: Yes Tendon Exposed: No Muscle Exposed: No Joint Exposed: No Bone Exposed: No Electronic Signature(s) Signed: 07/21/2020 4:06:40 PM By: [name] Entered By: [name] on 07/21/2020 10:00:00 -------------------------------------------------------------------------------- Wound Assessment Details Patient Name: Date of Service: [name], [name] 07/21/2020 9:30 A M Medical Record Number: [id number] Patient Account Number: [id number] Date of Birth/Sex: Treating RN: [birth year] (64 y.o. [name] Primary Care Kitara Hebb: [name] Other Clinician: Referring Quron Ruddy: Treating Aurther Harlin/Extender: [name] in Treatment: 4 Wound Status Wound Number: 13 Primary Neuropathic Ulcer-Non Diabetic Etiology: Wound Location: Right T Second oe Wound Open Wounding Event: Gradually Appeared Status: Date Acquired: 07/04/2020 Comorbid Anemia, Chronic Obstructive Pulmonary Disease (COPD), Weeks Of Treatment: 2 History: Hypertension, Osteoarthritis, Osteomyelitis, Neuropathy Clustered Wound: No Wound Measurements Length: (cm) 0.3 Width: (cm) 0.3 Depth: (cm) 0.1 Area: (cm) 0.071 Volume: (cm) 0.007 Wound  Description Classification: Full Thickness Without Exposed Support Structu Wound Margin: Distinct, outline attached Exudate Amount: None Present Foul Odor After Cleansing: Slough/Fibrino % Reduction in Area: 79.9% % Reduction in Volume: 95% Epithelialization: Large (67-100%) Tunneling: No Undermining: No res No No Wound Bed Granulation Amount: None Present (0%) Exposed Structure Necrotic Amount: None Present (0%) Fascia Exposed: No Fat Layer (Subcutaneous Tissue) Exposed: No Tendon Exposed: No Muscle Exposed: No Joint Exposed: No Bone Exposed: No Electronic Signature(s) Signed: 07/21/2020 4:21:43 PM By: Levan Hurst RN, BSN Entered By: Levan Hurst on 07/21/2020 10:22:55 -------------------------------------------------------------------------------- Vitals Details Patient Name: Date of Service: [name], [name] 07/21/2020 9:30 A M Medical Record Number: [id number] Patient Account Number: [id number] Date of Birth/Sex: Treating RN: [birth year] (64 y.o. [name] Primary Care Christon Parada: [name] Other Clinician: Referring Audriella Blakeley: Treating Maisie Hauser/Extender: [name] in Treatment: 4 Vital Signs Time Taken: 09:54 Temperature (F): 98.1 Height (in): 80 Pulse (bpm): 74 Weight (lbs): 350 Respiratory Rate (breaths/min): 18 Body Mass Index (BMI): 38.4 Blood Pressure (mmHg): 146/82 Reference Range: 80 - 120 mg / dl Electronic Signature(s) Signed: 07/21/2020 4:06:40 PM By: [name] Entered By: [name] on 07/21/2020 09:55:01

## 2020-07-23 ENCOUNTER — Ambulatory Visit (INDEPENDENT_AMBULATORY_CARE_PROVIDER_SITE_OTHER): Payer: Medicare PPO | Admitting: Physician Assistant

## 2020-07-23 ENCOUNTER — Encounter: Payer: Self-pay | Admitting: Physician Assistant

## 2020-07-23 VITALS — Ht >= 80 in | Wt 369.0 lb

## 2020-07-23 DIAGNOSIS — Z89432 Acquired absence of left foot: Secondary | ICD-10-CM

## 2020-07-23 MED ORDER — OXYCODONE-ACETAMINOPHEN 5-325 MG PO TABS: 1.0000 | ORAL_TABLET | Freq: Four times a day (QID) | ORAL | 0 refills | Status: DC | PRN

## 2020-07-23 NOTE — Progress Notes (Signed)
Post-Op Visit Note   Patient: [name].           Date of Birth: 09-Aug-1956           MRN: [id number] Visit Date: 07/23/2020 PCP: [name], MD  Chief Complaint:  Chief Complaint  Patient presents with  . Left Foot - Routine Post Op    05/30/20 left transmet amputation     HPI:  HPI The patient is a 64 year old woman seen status post left transmetatarsal amputation July 2 he has been slow to heal has been using Trental as well as nitroglycerin patches he is full weightbearing.  Ortho Exam Incision is well-healed centrally and laterally unfortunately over the first metatarsal head he does have 1 area of dehiscence this is about the size of a quarter filled in with granulation tissue.  There is about 3 mm of depth there is no surrounding erythema no drainage no sign of infection  Visit Diagnoses:  1. History of transmetatarsal amputation of left foot ([place])     Plan: He will complete his doxycycline course once this is completed we will not refill it.  We will plan to follow-up in 3 more weeks.  Minimize weightbearing continue using Trental and nitroglycerin patches  Follow-Up Instructions: Return in about 3 weeks (around 08/13/2020).   Imaging: No results found.  Orders:  No orders of the defined types were placed in this encounter.  No orders of the defined types were placed in this encounter.    PMFS History: Patient Active Problem List   Diagnosis Date Noted  . Abscess of left foot 05/30/2020  . Subacute osteomyelitis, left ankle and foot ([place])   . Skin ulcers of both feet ([place]) 10/24/2017  . Colovesical fistula   . Benign neoplasm of cecum   . Chronic venous insufficiency 07/20/2017  . Chronic left-sided low back pain without sciatica 11/08/2016  . Toe ulcer ([place]) 03/08/2016  . Verruca 12/28/2015  . Pre-ulcerative calluses 12/28/2015  . History of adenomatous polyp of colon 06/09/2015  . Lipoma 03/13/2014  . Plantar warts 07/09/2012  .  Cellulitis 07/04/2012  . Neuropathy 07/04/2012  . Hyperglycemia 07/04/2012  . HYPERTENSION, BENIGN 10/20/2010  . EDEMA 08/20/2010  . WISDOM TEETH EXTRACTION, HX OF 08/20/2010  . HYPOALBUMINEMIA 08/19/2010  . ANEMIA 08/19/2010  . NICOTINE ADDICTION 08/19/2010  . NECROTIZING PNEUMONIA 08/19/2010  . OSTEOARTHRITIS 08/19/2010   Past Medical History:  Diagnosis Date  . Arthritis   . Chronic kidney disease    mass r kidney - partial nephrectomy  . COPD (chronic obstructive pulmonary disease) ([place])   . Diverticulitis   . History of kidney stones   . Hx of adenomatous colonic polyps 06/09/2015  . Hyperlipidemia   . Hypertension    has previously been on medication but then taken off - 05/29/20 pt states he's never been treated for HTN  . Neuromuscular disorder (H[facility])    neuropathy lower legs and feet  . Neuropathy    in both feet  . Pneumonia 2011    Family History  Problem Relation Age of Onset  . Heart disease Mother   . Esophageal cancer Mother   . Diabetes Father   . Pancreatic cancer Father   . Breast cancer Sister   . Colon cancer Neg Hx   . Rectal cancer Neg Hx   . Stomach cancer Neg Hx   . Colon polyps Neg Hx     Past Surgical History:  Procedure Laterality Date  . AMPUTATION  Bilateral 06/05/2013   Procedure: RIGHT GREAT TOE AMPUTATION/LEFT GREAT TOE DEBRIDEMENT;  Surgeon: Wylene Simmer, MD;  Location: [facility];  Service: Orthopedics;  Laterality: Bilateral;  . AMPUTATION Left 05/30/2020   Procedure: LEFT TRANSMETATARSAL AMPUTATION;  Surgeon: Newt Minion, MD;  Location: [facility];  Service: Orthopedics;  Laterality: Left;  . APPLICATION OF WOUND VAC Left 05/30/2020   Procedure: APPLICATION OF WOUND VAC;  Surgeon: Newt Minion, MD;  Location: [facility];  Service: Orthopedics;  Laterality: Left;  . COLON SURGERY     robotic partial colectomy Dr. Marcello Moores 12-30-17  . COLONOSCOPY     x 2 last date 09/21/2017 bladder attached to colon eval  . COLONOSCOPY WITH PROPOFOL N/A 09/21/2017    Procedure: COLONOSCOPY WITH PROPOFOL;  Surgeon: [name], MD;  Location[facility];  Service: Endoscopy;  Laterality: N/A;  . MOUTH SURGERY    . PARTIAL NEPHRECTOMY     right Dr. Leighton Ruff 01-31-27  . ROBOTIC ASSITED PARTIAL NEPHRECTOMY Right 12/30/2017   Procedure: XI ROBOTIC ASSITED RIGHT PARTIAL NEPHRECTOMY;  Surgeon: Alexis Frock, MD;  Location: [facility];  Service: Urology;  Laterality: Right;  . TOE AMPUTATION Right 06/05/2013   RIGHT GREAT TOE PARTIAL AMPUTATION    . TOOTH EXTRACTION  2011   multiple teeth extracted   Social History   Occupational History  . Occupation: retired  Tobacco Use  . Smoking status: Current Some Day Smoker    Packs/day: 0.10    Years: 20.00    Pack years: 2.00    Types: Cigarettes    Start date: 11/29/1973  . Smokeless tobacco: Never Used  . Tobacco comment: occasional smoker - ~1 pack/week. smoked ~1.5ppd until 5 yr ago  Vaping Use  . Vaping Use: Never used  Substance and Sexual Activity  . Alcohol use: No    Alcohol/week: 0.0 standard drinks  . Drug use: No  . Sexual activity: Not Currently    Partners: Female

## 2020-07-29 DIAGNOSIS — K5792 Diverticulitis of intestine, part unspecified, without perforation or abscess without bleeding: Secondary | ICD-10-CM | POA: Diagnosis not present

## 2020-07-29 DIAGNOSIS — I129 Hypertensive chronic kidney disease with stage 1 through stage 4 chronic kidney disease, or unspecified chronic kidney disease: Secondary | ICD-10-CM | POA: Diagnosis not present

## 2020-07-29 DIAGNOSIS — J449 Chronic obstructive pulmonary disease, unspecified: Secondary | ICD-10-CM | POA: Diagnosis not present

## 2020-07-29 DIAGNOSIS — Z4801 Encounter for change or removal of surgical wound dressing: Secondary | ICD-10-CM | POA: Diagnosis not present

## 2020-07-29 DIAGNOSIS — M86672 Other chronic osteomyelitis, left ankle and foot: Secondary | ICD-10-CM | POA: Diagnosis not present

## 2020-07-29 DIAGNOSIS — G629 Polyneuropathy, unspecified: Secondary | ICD-10-CM | POA: Diagnosis not present

## 2020-07-29 DIAGNOSIS — Z4781 Encounter for orthopedic aftercare following surgical amputation: Secondary | ICD-10-CM | POA: Diagnosis not present

## 2020-07-29 DIAGNOSIS — N189 Chronic kidney disease, unspecified: Secondary | ICD-10-CM | POA: Diagnosis not present

## 2020-07-29 DIAGNOSIS — L97526 Non-pressure chronic ulcer of other part of left foot with bone involvement without evidence of necrosis: Secondary | ICD-10-CM | POA: Diagnosis not present

## 2020-08-02 DIAGNOSIS — T8781 Dehiscence of amputation stump: Secondary | ICD-10-CM | POA: Diagnosis not present

## 2020-08-02 DIAGNOSIS — L97512 Non-pressure chronic ulcer of other part of right foot with fat layer exposed: Secondary | ICD-10-CM | POA: Diagnosis not present

## 2020-08-02 DIAGNOSIS — T8744 Infection of amputation stump, left lower extremity: Secondary | ICD-10-CM | POA: Diagnosis not present

## 2020-08-02 DIAGNOSIS — M86672 Other chronic osteomyelitis, left ankle and foot: Secondary | ICD-10-CM | POA: Diagnosis not present

## 2020-08-02 DIAGNOSIS — J449 Chronic obstructive pulmonary disease, unspecified: Secondary | ICD-10-CM | POA: Diagnosis not present

## 2020-08-02 DIAGNOSIS — G9009 Other idiopathic peripheral autonomic neuropathy: Secondary | ICD-10-CM | POA: Diagnosis not present

## 2020-08-02 DIAGNOSIS — L97526 Non-pressure chronic ulcer of other part of left foot with bone involvement without evidence of necrosis: Secondary | ICD-10-CM | POA: Diagnosis not present

## 2020-08-02 DIAGNOSIS — I129 Hypertensive chronic kidney disease with stage 1 through stage 4 chronic kidney disease, or unspecified chronic kidney disease: Secondary | ICD-10-CM | POA: Diagnosis not present

## 2020-08-02 DIAGNOSIS — N189 Chronic kidney disease, unspecified: Secondary | ICD-10-CM | POA: Diagnosis not present

## 2020-08-04 DIAGNOSIS — L97526 Non-pressure chronic ulcer of other part of left foot with bone involvement without evidence of necrosis: Secondary | ICD-10-CM | POA: Diagnosis not present

## 2020-08-04 DIAGNOSIS — L97512 Non-pressure chronic ulcer of other part of right foot with fat layer exposed: Secondary | ICD-10-CM | POA: Diagnosis not present

## 2020-08-04 DIAGNOSIS — T8781 Dehiscence of amputation stump: Secondary | ICD-10-CM | POA: Diagnosis not present

## 2020-08-04 DIAGNOSIS — M86672 Other chronic osteomyelitis, left ankle and foot: Secondary | ICD-10-CM | POA: Diagnosis not present

## 2020-08-04 DIAGNOSIS — I129 Hypertensive chronic kidney disease with stage 1 through stage 4 chronic kidney disease, or unspecified chronic kidney disease: Secondary | ICD-10-CM | POA: Diagnosis not present

## 2020-08-04 DIAGNOSIS — J449 Chronic obstructive pulmonary disease, unspecified: Secondary | ICD-10-CM | POA: Diagnosis not present

## 2020-08-04 DIAGNOSIS — T8744 Infection of amputation stump, left lower extremity: Secondary | ICD-10-CM | POA: Diagnosis not present

## 2020-08-04 DIAGNOSIS — N189 Chronic kidney disease, unspecified: Secondary | ICD-10-CM | POA: Diagnosis not present

## 2020-08-04 DIAGNOSIS — G9009 Other idiopathic peripheral autonomic neuropathy: Secondary | ICD-10-CM | POA: Diagnosis not present

## 2020-08-05 ENCOUNTER — Encounter (HOSPITAL_BASED_OUTPATIENT_CLINIC_OR_DEPARTMENT_OTHER): Payer: Medicare PPO | Admitting: Internal Medicine

## 2020-08-08 DIAGNOSIS — T8744 Infection of amputation stump, left lower extremity: Secondary | ICD-10-CM | POA: Diagnosis not present

## 2020-08-08 DIAGNOSIS — N189 Chronic kidney disease, unspecified: Secondary | ICD-10-CM | POA: Diagnosis not present

## 2020-08-08 DIAGNOSIS — T8781 Dehiscence of amputation stump: Secondary | ICD-10-CM | POA: Diagnosis not present

## 2020-08-08 DIAGNOSIS — L97526 Non-pressure chronic ulcer of other part of left foot with bone involvement without evidence of necrosis: Secondary | ICD-10-CM | POA: Diagnosis not present

## 2020-08-08 DIAGNOSIS — L97512 Non-pressure chronic ulcer of other part of right foot with fat layer exposed: Secondary | ICD-10-CM | POA: Diagnosis not present

## 2020-08-08 DIAGNOSIS — I129 Hypertensive chronic kidney disease with stage 1 through stage 4 chronic kidney disease, or unspecified chronic kidney disease: Secondary | ICD-10-CM | POA: Diagnosis not present

## 2020-08-08 DIAGNOSIS — J449 Chronic obstructive pulmonary disease, unspecified: Secondary | ICD-10-CM | POA: Diagnosis not present

## 2020-08-08 DIAGNOSIS — G9009 Other idiopathic peripheral autonomic neuropathy: Secondary | ICD-10-CM | POA: Diagnosis not present

## 2020-08-08 DIAGNOSIS — M86672 Other chronic osteomyelitis, left ankle and foot: Secondary | ICD-10-CM | POA: Diagnosis not present

## 2020-08-11 ENCOUNTER — Other Ambulatory Visit: Payer: Self-pay

## 2020-08-11 ENCOUNTER — Encounter (HOSPITAL_BASED_OUTPATIENT_CLINIC_OR_DEPARTMENT_OTHER): Payer: Medicare PPO | Attending: Internal Medicine | Admitting: Internal Medicine

## 2020-08-11 DIAGNOSIS — G629 Polyneuropathy, unspecified: Secondary | ICD-10-CM | POA: Diagnosis not present

## 2020-08-11 DIAGNOSIS — I872 Venous insufficiency (chronic) (peripheral): Secondary | ICD-10-CM | POA: Insufficient documentation

## 2020-08-11 DIAGNOSIS — D649 Anemia, unspecified: Secondary | ICD-10-CM | POA: Diagnosis not present

## 2020-08-11 DIAGNOSIS — I739 Peripheral vascular disease, unspecified: Secondary | ICD-10-CM | POA: Insufficient documentation

## 2020-08-11 DIAGNOSIS — L97518 Non-pressure chronic ulcer of other part of right foot with other specified severity: Secondary | ICD-10-CM | POA: Insufficient documentation

## 2020-08-11 DIAGNOSIS — R569 Unspecified convulsions: Secondary | ICD-10-CM | POA: Diagnosis not present

## 2020-08-11 DIAGNOSIS — Z89411 Acquired absence of right great toe: Secondary | ICD-10-CM | POA: Diagnosis not present

## 2020-08-11 DIAGNOSIS — G9009 Other idiopathic peripheral autonomic neuropathy: Secondary | ICD-10-CM | POA: Diagnosis not present

## 2020-08-11 DIAGNOSIS — J449 Chronic obstructive pulmonary disease, unspecified: Secondary | ICD-10-CM | POA: Diagnosis not present

## 2020-08-11 DIAGNOSIS — M1990 Unspecified osteoarthritis, unspecified site: Secondary | ICD-10-CM | POA: Diagnosis not present

## 2020-08-11 DIAGNOSIS — G608 Other hereditary and idiopathic neuropathies: Secondary | ICD-10-CM | POA: Diagnosis not present

## 2020-08-11 DIAGNOSIS — I10 Essential (primary) hypertension: Secondary | ICD-10-CM | POA: Diagnosis not present

## 2020-08-11 DIAGNOSIS — L97512 Non-pressure chronic ulcer of other part of right foot with fat layer exposed: Secondary | ICD-10-CM | POA: Diagnosis not present

## 2020-08-12 DIAGNOSIS — M86672 Other chronic osteomyelitis, left ankle and foot: Secondary | ICD-10-CM | POA: Diagnosis not present

## 2020-08-12 DIAGNOSIS — T8744 Infection of amputation stump, left lower extremity: Secondary | ICD-10-CM | POA: Diagnosis not present

## 2020-08-12 DIAGNOSIS — I129 Hypertensive chronic kidney disease with stage 1 through stage 4 chronic kidney disease, or unspecified chronic kidney disease: Secondary | ICD-10-CM | POA: Diagnosis not present

## 2020-08-12 DIAGNOSIS — L97512 Non-pressure chronic ulcer of other part of right foot with fat layer exposed: Secondary | ICD-10-CM | POA: Diagnosis not present

## 2020-08-12 DIAGNOSIS — L97526 Non-pressure chronic ulcer of other part of left foot with bone involvement without evidence of necrosis: Secondary | ICD-10-CM | POA: Diagnosis not present

## 2020-08-12 DIAGNOSIS — N189 Chronic kidney disease, unspecified: Secondary | ICD-10-CM | POA: Diagnosis not present

## 2020-08-12 DIAGNOSIS — J449 Chronic obstructive pulmonary disease, unspecified: Secondary | ICD-10-CM | POA: Diagnosis not present

## 2020-08-12 DIAGNOSIS — T8781 Dehiscence of amputation stump: Secondary | ICD-10-CM | POA: Diagnosis not present

## 2020-08-12 DIAGNOSIS — G9009 Other idiopathic peripheral autonomic neuropathy: Secondary | ICD-10-CM | POA: Diagnosis not present

## 2020-08-13 ENCOUNTER — Encounter: Payer: Self-pay | Admitting: Physician Assistant

## 2020-08-13 ENCOUNTER — Ambulatory Visit (INDEPENDENT_AMBULATORY_CARE_PROVIDER_SITE_OTHER): Payer: Medicare PPO | Admitting: Physician Assistant

## 2020-08-13 DIAGNOSIS — Z89432 Acquired absence of left foot: Secondary | ICD-10-CM

## 2020-08-13 NOTE — Progress Notes (Signed)
Office Visit Note   Patient: [name].           Date of Birth: [birth year]           MRN: [id number] Visit Date: 08/13/2020              Requested by: [name], MD [address] [place],  [place] [address] PCP: [name], MD  No chief complaint on file.     HPI: Is a pleasant gentleman who is status post transmetatarsal amputation 3 months ago.  He is here for follow-up he has had some delayed healing.  He is using a nitroglycerin patch   Assessment & Plan: Visit Diagnoses: No diagnosis found.  Plan: We will see him back in 3 weeks.  Did give him a small piece of silver cell to place over the 1 dehisced area.  Follow-Up Instructions: No follow-ups on file.   Ortho Exam  Patient is alert, oriented, no adenopathy, well-dressed, normal affect, normal respiratory effort. Left foot he has thickened eschar callus on the plantar portion of the amputation stump.  He has 1 area of about 3 x 2 that has dehisced.  It does not probe deeply does not have a foul odor.  No cellulitis.  I debrided a significant amount of the eschar on the plantar portion of the wound to healthy bleeding surface.  He will follow-up in 3 weeks.  He may at some point need revision in the one area that seems slow to heal but we will continue with current treatment at least until I see him back unless things change  Imaging: No results found. No images are attached to the encounter.  Labs: Lab Results  Component Value Date   HGBA1C 6.0 (H) 08/23/2017   HGBA1C 5.3 03/12/2014   HGBA1C 5.4 07/04/2012   ESRSEDRATE 14 06/14/2017   ESRSEDRATE 30 05/17/2017   ESRSEDRATE 23 (H) 01/31/2015   CRP 23.4 (H) 06/14/2017   CRP 37.4 (H) 05/17/2017   CRP 55.8 (H) 03/29/2017   REPTSTATUS 09/17/2010 FINAL 08/03/2010   GRAMSTAIN  08/02/2010    FEW WBC PRESENT, PREDOMINANTLY PMN NO SQUAMOUS EPITHELIAL CELLS SEEN RARE GRAM NEGATIVE RODS   CULT NO ACID FAST BACILLI ISOLATED IN 6 WEEKS 08/03/2010      Lab Results  Component Value Date   ALBUMIN 4.0 03/12/2014   ALBUMIN 3.0 (L) 07/04/2012   ALBUMIN 1.7 (L) 08/02/2010   PREALBUMIN 4.3 (L) 08/02/2010    Lab Results  Component Value Date   MG 2.4 07/04/2012   MG 2.2 08/01/2010   No results found for: VD25OH  Lab Results  Component Value Date   PREALBUMIN 4.3 (L) 08/02/2010   CBC EXTENDED Latest Ref Rng & Units 05/30/2020 01/02/2018 01/01/2018  WBC 4.0 - 10.5 K/uL 9.0 12.4(H) 15.1(H)  RBC 4.22 - 5.81 MIL/uL 4.67 4.16(L) 4.09(L)  HGB 13.0 - 17.0 g/dL 13.6 12.5(L) 12.4(L)  HCT 39 - 52 % 43.3 38.4(L) 37.6(L)  PLT 150 - 400 K/uL 445(H) 223 232  NEUTROABS 1 - 7 x10E3/uL - - -  LYMPHSABS 0 - 3 x10E3/uL - - -     There is no height or weight on file to calculate BMI.  Orders:  No orders of the defined types were placed in this encounter.  No orders of the defined types were placed in this encounter.    Procedures: No procedures performed  Clinical Data: No additional findings.  ROS:  All other systems negative, except as noted in  the HPI. Review of Systems  Objective: Vital Signs: There were no vitals taken for this visit.  Specialty Comments:  No specialty comments available.  PMFS History: Patient Active Problem List   Diagnosis Date Noted  . Abscess of left foot 05/30/2020  . Subacute osteomyelitis, left ankle and foot ([place])   . Skin ulcers of both feet ([place]) 10/24/2017  . Colovesical fistula   . Benign neoplasm of cecum   . Chronic venous insufficiency 07/20/2017  . Chronic left-sided low back pain without sciatica 11/08/2016  . Toe ulcer ([place]) 03/08/2016  . Verruca 12/28/2015  . Pre-ulcerative calluses 12/28/2015  . History of adenomatous polyp of colon 06/09/2015  . Lipoma 03/13/2014  . Plantar warts 07/09/2012  . Cellulitis 07/04/2012  . Neuropathy 07/04/2012  . Hyperglycemia 07/04/2012  . HYPERTENSION, BENIGN 10/20/2010  . EDEMA 08/20/2010  . WISDOM TEETH EXTRACTION, HX OF 08/20/2010  .  HYPOALBUMINEMIA 08/19/2010  . ANEMIA 08/19/2010  . NICOTINE ADDICTION 08/19/2010  . NECROTIZING PNEUMONIA 08/19/2010  . OSTEOARTHRITIS 08/19/2010   Past Medical History:  Diagnosis Date  . Arthritis   . Chronic kidney disease    mass r kidney - partial nephrectomy  . COPD (chronic obstructive pulmonary disease) ([place])   . Diverticulitis   . History of kidney stones   . Hx of adenomatous colonic polyps 06/09/2015  . Hyperlipidemia   . Hypertension    has previously been on medication but then taken off - 05/29/20 pt states he's never been treated for HTN  . Neuromuscular disorder ([facility])    neuropathy lower legs and feet  . Neuropathy    in both feet  . Pneumonia 2011    Family History  Problem Relation Age of Onset  . Heart disease Mother   . Esophageal cancer Mother   . Diabetes Father   . Pancreatic cancer Father   . Breast cancer Sister   . Colon cancer Neg Hx   . Rectal cancer Neg Hx   . Stomach cancer Neg Hx   . Colon polyps Neg Hx     Past Surgical History:  Procedure Laterality Date  . AMPUTATION Bilateral 06/05/2013   Procedure: RIGHT GREAT TOE AMPUTATION/LEFT GREAT TOE DEBRIDEMENT;  Surgeon: Wylene Simmer, MD;  Location: [facility];  Service: Orthopedics;  Laterality: Bilateral;  . AMPUTATION Left 05/30/2020   Procedure: LEFT TRANSMETATARSAL AMPUTATION;  Surgeon: Newt Minion, MD;  Location: [facility];  Service: Orthopedics;  Laterality: Left;  . APPLICATION OF WOUND VAC Left 05/30/2020   Procedure: APPLICATION OF WOUND VAC;  Surgeon: Newt Minion, MD;  Location: [facility];  Service: Orthopedics;  Laterality: Left;  . COLON SURGERY     robotic partial colectomy Dr. Marcello Moores 12-30-17  . COLONOSCOPY     x 2 last date 09/21/2017 bladder attached to colon eval  . COLONOSCOPY WITH PROPOFOL N/A 09/21/2017   Procedure: COLONOSCOPY WITH PROPOFOL;  Surgeon: [name], MD;  [facility];  Service: Endoscopy;  Laterality: N/A;  . MOUTH SURGERY    . PARTIAL NEPHRECTOMY     right  Dr. Leighton Ruff [birth year]  . ROBOTIC ASSITED PARTIAL NEPHRECTOMY Right 12/30/2017   Procedure: XI ROBOTIC ASSITED RIGHT PARTIAL NEPHRECTOMY;  Surgeon: Alexis Frock, MD;  [facility];  Service: Urology;  Laterality: Right;  . TOE AMPUTATION Right 06/05/2013   RIGHT GREAT TOE PARTIAL AMPUTATION    . TOOTH EXTRACTION  2011   multiple teeth extracted   Social History   Occupational History  . Occupation:  retired  Tobacco Use  . Smoking status: Current Some Day Smoker    Packs/day: 0.10    Years: 20.00    Pack years: 2.00    Types: Cigarettes    Start date: 11/29/1973  . Smokeless tobacco: Never Used  . Tobacco comment: occasional smoker - ~1 pack/week. smoked ~1.5ppd until 5 yr ago  Vaping Use  . Vaping Use: Never used  Substance and Sexual Activity  . Alcohol use: No    Alcohol/week: 0.0 standard drinks  . Drug use: No  . Sexual activity: Not Currently    Partners: Female

## 2020-08-13 NOTE — Progress Notes (Signed)
[name], [name] ([id number]) Visit Report for 08/11/2020 Arrival Information Details Patient Name: Date of Service: [name], [name] 08/11/2020 10:15 A M Medical Record Number: [id number] Patient Account Number: [id number] Date of Birth/Sex: Treating RN: 15-Jan-1956 (64 y.o. [name] Primary Care Diane Mochizuki: [name] Other Clinician: Referring Dashiell Franchino: Treating Nayah Lukens/Extender: [name] in Treatment: 7 Visit Information History Since Last Visit Added or deleted any medications: No Patient Arrived: Crutches Any new allergies or adverse reactions: No Arrival Time: 11:12 Had a fall or experienced change in No Accompanied By: self activities of daily living that may affect Transfer Assistance: None risk of falls: Patient Identification Verified: Yes Signs or symptoms of abuse/neglect since last visito No Secondary Verification Process Completed: Yes Hospitalized since last visit: No Patient Requires Transmission-Based Precautions: No Implantable device outside of the clinic excluding No Patient Has Alerts: No cellular tissue based products placed in the center since last visit: Has Dressing in Place as Prescribed: Yes Pain Present Now: No Electronic Signature(s) Signed: 08/11/2020 6:04:50 PM By: Kela Millin Entered By: Kela Millin on 08/11/2020 11:12:43 -------------------------------------------------------------------------------- Encounter Discharge Information Details Patient Name: Date of Service: [name], [name] 08/11/2020 10:15 A M Medical Record Number: [id number] Patient Account Number: [id number] Date of Birth/Sex: Treating RN: 19-Oct-1956 (64 y.o. [name] Primary Care Makela Niehoff: [name] Other Clinician: Referring Tana Trefry: Treating Madailein Londo/Extender: [name] in Treatment: 7 Encounter Discharge Information Items Post Procedure Vitals Discharge Condition:  Stable Temperature (F): 98.4 Ambulatory Status: Crutches Pulse (bpm): 83 Discharge Destination: Home Respiratory Rate (breaths/min): 18 Transportation: Private Auto Blood Pressure (mmHg): 158/79 Accompanied By: self Schedule Follow-up Appointment: Yes Clinical Summary of Care: Electronic Signature(s) Signed: 08/11/2020 5:15:56 PM By: Deon Pilling Entered By: Deon Pilling on 08/11/2020 12:19:58 -------------------------------------------------------------------------------- Lower Extremity Assessment Details Patient Name: Date of Service: [name], [name] 08/11/2020 10:15 A M Medical Record Number: [id number] Patient Account Number: [id number] Date of Birth/Sex: Treating RN: [birth year] (64 y.o. [name] Primary Care Jeniece Hannis: [name] Other Clinician: Referring Lanson Randle: Treating Cortni Tays/Extender: [name] in Treatment: 7 Edema Assessment Assessed: [Left: No] [Right: No] Edema: [Left: Ye] [Right: s] Calf Left: Right: Point of Measurement: cm From Medial Instep cm 44.5 cm Ankle Left: Right: Point of Measurement: cm From Medial Instep cm 26.5 cm Vascular Assessment Pulses: Dorsalis Pedis Palpable: [Right:No] Electronic Signature(s) Signed: 08/11/2020 6:04:50 PM By: Kela Millin Entered By: Kela Millin on 08/11/2020 11:18:49 -------------------------------------------------------------------------------- Multi Wound Chart Details Patient Name: Date of Service: [name], [name] 08/11/2020 10:15 A M Medical Record Number: [id number] Patient Account Number: [id number] Date of Birth/Sex: Treating RN: 12-06-1955 (64 y.o. M) Primary Care Shadrach Bartunek: [name] Other Clinician: Referring Arizona Nordquist: Treating Doranne Schmutz/Extender: [name] in Treatment: 7 Vital Signs Height(in): 80 Pulse(bpm): 8 Weight(lbs): 350 Blood Pressure(mmHg): 158/79 Body Mass Index(BMI):  38 Temperature(F): 98.4 Respiratory Rate(breaths/min): 18 Photos: [12:No Photos Right Metatarsal head first] [13:No 8 Right T Second oe] [N/A:N/A N/A] Wound Location: [12:Gradually Appeared] [13:Gradually Appeared] [N/A:N/A] Wounding Event: [12:Neuropathic Ulcer-Non Diabetic] [13:Neuropathic Ulcer-Non Diabetic] [N/A:N/A] Primary Etiology: [12:Anemia, Chronic Obstructive] [13:Anemia, Chronic Obstructive] [N/A:N/A] Comorbid History: [12:Pulmonary Disease (COPD), Hypertension, Osteoarthritis, Osteomyelitis, Neuropathy 11/29/2018] [13:Pulmonary Disease (COPD), Hypertension, Osteoarthritis, Osteomyelitis, Neuropathy 07/04/2020] [N/A:N/A] Date Acquired: [12:7] [13:5] [N/A:N/A] Weeks of Treatment: [12:Open] [13:Healed - Epithelialized] [N/A:N/A] Wound Status: [12:2.7x2.2x0.7] [13:0x0x0] [N/A:N/A] Measurements L x W x D (cm) [12:4.665] [13:0] [N/A:N/A] A (cm) : rea [12:3.266] [13:0] [N/A:N/A]  Volume (cm) : [12:50.00%] [13:100.00%] [N/A:N/A] % Reduction in Area: [12:-75.00%] [13:100.00%] [N/A:N/A] % Reduction in Volume: [12:Full Thickness Without Exposed] [13:Full Thickness Without Exposed] [N/A:N/A] Classification: [12:Support Structures Medium] [13:Support Structures None Present] [N/A:N/A] Exudate A mount: [12:Serosanguineous] [13:N/A] [N/A:N/A] Exudate Type: [12:red, brown] [13:N/A] [N/A:N/A] Exudate Color: [12:Thickened] [13:Distinct, outline attached] [N/A:N/A] Wound Margin: [12:Large (67-100%)] [13:None Present (0%)] [N/A:N/A] Granulation A mount: [12:Red] [13:N/A] [N/A:N/A] Granulation Quality: [12:None Present (0%)] [13:None Present (0%)] [N/A:N/A] Necrotic A mount: [12:Fat Layer (Subcutaneous Tissue): Yes Fascia: No] [N/A:N/A] Exposed Structures: [12:Fascia: No Tendon: No Muscle: No Joint: No Bone: No Small (1-33%)] [13:Fat Layer (Subcutaneous Tissue): No Tendon: No Muscle: No Joint: No Bone: No Large (67-100%)] [N/A:N/A] Epithelialization: [12:Debridement - Excisional] [13:N/A]  [N/A:N/A] Debridement: Pre-procedure Verification/Time Out 11:46 [13:N/A] [N/A:N/A] Taken: [12:Callus, Subcutaneous] [13:N/A] [N/A:N/A] Tissue Debrided: [12:Skin/Subcutaneous Tissue] [13:N/A] [N/A:N/A] Level: [12:5.94] [13:N/A] [N/A:N/A] Debridement A (sq cm): [12:rea Blade, Forceps] [13:N/A] [N/A:N/A] Instrument: [12:Moderate] [13:N/A] [N/A:N/A] Bleeding: [12:Silver Nitrate] [13:N/A] [N/A:N/A] Hemostasis A chieved: [12:0] [13:N/A] [N/A:N/A] Procedural Pain: [12:0] [13:N/A] [N/A:N/A] Post Procedural Pain: [12:Procedure was tolerated well] [13:N/A] [N/A:N/A] Debridement Treatment Response: [12:2.7x2.2x0.7] [13:N/A] [N/A:N/A] Post Debridement Measurements L x W x D (cm) [12:3.266] [13:N/A] [N/A:N/A] Post Debridement Volume: (cm) [12:Debridement] [13:N/A] [N/A:N/A] Treatment Notes Electronic Signature(s) Signed: 08/12/2020 5:12:45 PM By: [name] MD Entered By: [name] on 08/11/2020 12:01:11 -------------------------------------------------------------------------------- Multi-Disciplinary Care Plan Details Patient Name: Date of Service: [name], [name] 08/11/2020 10:15 A M Medical Record Number: [id number] Patient Account Number: [id number] Date of Birth/Sex: Treating RN: 25-Apr-1956 (64 y.o. [name] Primary Care Keosha Rossa: [name] Other Clinician: Referring Kristin Barcus: Treating Lizeth Bencosme/Extender: [name] in Treatment: 7 Active Inactive Wound/Skin Impairment Nursing Diagnoses: Impaired tissue integrity Knowledge deficit related to ulceration/compromised skin integrity Goals: Patient/caregiver will verbalize understanding of skin care regimen Date Initiated: 06/17/2020 Target Resolution Date: 08/22/2020 Goal Status: Active Ulcer/skin breakdown will have a volume reduction of 30% by week 4 Date Initiated: 06/17/2020 Date Inactivated: 07/21/2020 Target Resolution Date: 07/18/2020 Goal Status:  Met Interventions: Assess patient/caregiver ability to obtain necessary supplies Assess patient/caregiver ability to perform ulcer/skin care regimen upon admission and as needed Assess ulceration(s) every visit Provide education on ulcer and skin care Notes: Electronic Signature(s) Signed: 08/13/2020 5:57:30 PM By: Levan Hurst RN, BSN Entered By: Levan Hurst on 08/11/2020 11:41:35 -------------------------------------------------------------------------------- Pain Assessment Details Patient Name: Date of Service: [name], [name] 08/11/2020 10:15 A M Medical Record Number: [id number] Patient Account Number: [id number] Date of Birth/Sex: Treating RN: [birth year] (64 y.o. [name] Primary Care Deetra Booton: [name] Other Clinician: Referring Runette Scifres: Treating Matilde Pottenger/Extender: [name] in Treatment: 7 Active Problems Location of Pain Severity and Description of Pain Patient Has Paino No Site Locations Pain Management and Medication Current Pain Management: Electronic Signature(s) Signed: 08/11/2020 6:04:50 PM By: Kela Millin Entered By: Kela Millin on 08/11/2020 11:18:41 -------------------------------------------------------------------------------- Patient/Caregiver Education Details Patient Name: Date of Service: [name], [name] 9/13/2021andnbsp10:15 A M Medical Record Number: [id number] Patient Account Number: [id number] Date of Birth/Gender: Treating RN: 10/09/1956 (64 y.o. [name] Primary Care Physician: [name] Other Clinician: Referring Physician: Treating Physician/Extender: [name] in Treatment: 7 Education Assessment Education Provided To: Patient Education Topics Provided Wound/Skin Impairment: Methods: Explain/Verbal Responses: State content correctly [employer]) Signed: 08/13/2020 5:57:30 PM By: Levan Hurst RN,  BSN Entered By: Levan Hurst on 08/11/2020 11:41:48 -------------------------------------------------------------------------------- Wound Assessment Details Patient Name: Date of Service: [name], CHA  RLES E. 08/11/2020 10:15 A M Medical Record Number: [id number] Patient Account Number: [id number] Date of Birth/Sex: Treating RN: 11/27/1956 (64 y.o. Katherina Right Primary Care Jamyria Ozanich: Alysia Penna Other Clinician: Referring Barkley Kratochvil: Treating Keiarra Charon/Extender: Jerl Mina in Treatment: 7 Wound Status Wound Number: 12 Primary Neuropathic Ulcer-Non Diabetic Etiology: Wound Location: Right Metatarsal head first Wound Open Wounding Event: Gradually Appeared Status: Date Acquired: 11/29/2018 Comorbid Anemia, Chronic Obstructive Pulmonary Disease (COPD), Weeks Of Treatment: 7 History: Hypertension, Osteoarthritis, Osteomyelitis, Neuropathy Clustered Wound: No Photos Photo Uploaded By: Benjaman Kindler on 08/13/2020 14:22:47 Wound Measurements Length: (cm) 2.7 Width: (cm) 2.2 Depth: (cm) 0.7 Area: (cm) 4.665 Volume: (cm) 3.266 % Reduction in Area: 50% % Reduction in Volume: -75% Epithelialization: Small (1-33%) Tunneling: No Undermining: No Wound Description Classification: Full Thickness Without Exposed Support Structures Wound Margin: Thickened Exudate Amount: Medium Exudate Type: Serosanguineous Exudate Color: red, brown Foul Odor After Cleansing: No Slough/Fibrino No Wound Bed Granulation Amount: Large (67-100%) Exposed Structure Granulation Quality: Red Fascia Exposed: No Necrotic Amount: None Present (0%) Fat Layer (Subcutaneous Tissue) Exposed: Yes Tendon Exposed: No Muscle Exposed: No Joint Exposed: No Bone Exposed: No Treatment Notes Wound #12 (Right Metatarsal head first) 1. Cleanse With Wound Cleanser 3. Primary Dressing Applied Polymem Ag 4. Secondary Dressing Dry Gauze Roll Gauze Foam 5. Secured  With Medipore tape 7. Footwear/Offloading device applied Surgical shoe Notes foam donut as secondary dressing. Electronic Signature(s) Signed: 08/11/2020 6:04:50 PM By: Cherylin Mylar Entered By: Cherylin Mylar on 08/11/2020 11:21:06 -------------------------------------------------------------------------------- Wound Assessment Details Patient Name: Date of Service: [name], [name] 08/11/2020 10:15 A M Medical Record Number: [id number] Patient Account Number: [id number] Date of Birth/Sex: Treating RN: [birth year] (64 y.o. Katherina Right Primary Care Eliav Mechling: Alysia Penna Other Clinician: Referring Ayaan Ringle: Treating Raphaela Cannaday/Extender: Jerl Mina in Treatment: 7 Wound Status Wound Number: 13 Primary Neuropathic Ulcer-Non Diabetic Etiology: Wound Location: Right T Second oe Wound Healed - Epithelialized Wounding Event: Gradually Appeared Status: Date Acquired: 07/04/2020 Comorbid Anemia, Chronic Obstructive Pulmonary Disease (COPD), Weeks Of Treatment: 5 History: Hypertension, Osteoarthritis, Osteomyelitis, Neuropathy Clustered Wound: No Photos Photo Uploaded By: Benjaman Kindler on 08/13/2020 14:22:48 Wound Measurements Length: (cm) Width: (cm) Depth: (cm) Area: (cm) Volume: (cm) 0 % Reduction in Area: 100% 0 % Reduction in Volume: 100% 0 Epithelialization: Large (67-100%) 0 Tunneling: No 0 Undermining: No Wound Description Classification: Full Thickness Without Exposed Support Structures Wound Margin: Distinct, outline attached Exudate Amount: None Present Foul Odor After Cleansing: No Slough/Fibrino No Wound Bed Granulation Amount: None Present (0%) Exposed Structure Necrotic Amount: None Present (0%) Fascia Exposed: No Fat Layer (Subcutaneous Tissue) Exposed: No Tendon Exposed: No Muscle Exposed: No Joint Exposed: No Bone Exposed: No Electronic Signature(s) Signed: 08/11/2020 6:04:50 PM By: Cherylin Mylar Signed: 08/13/2020 5:57:30 PM By: Zandra Abts RN, BSN Entered By: Zandra Abts on 08/11/2020 11:48:34 -------------------------------------------------------------------------------- Vitals Details Patient Name: Date of Service: [name], [name] 08/11/2020 10:15 A M Medical Record Number: [id number] Patient Account Number: [id number] Date of Birth/Sex: Treating RN: 23-Jul-1956 (64 y.o. Katherina Right Primary Care Zayvian Mcmurtry: Alysia Penna Other Clinician: Referring Vashon Arch: Treating Vestal Crandall/Extender: Jerl Mina in Treatment: 7 Vital Signs Time Taken: 11:12 Temperature (F): 98.4 Height (in): 80 Pulse (bpm): 83 Weight (lbs): 350 Respiratory Rate (breaths/min): 18 Body Mass Index (BMI): 38.4 Blood Pressure (mmHg): 158/79 Reference Range: 80 - 120 mg / dl Electronic Signature(s) Signed: 08/11/2020 6:04:50 PM By: Cherylin Mylar Entered By: Cherylin Mylar on  08/11/2020 11:18:36 

## 2020-08-13 NOTE — Progress Notes (Signed)
[name], [name] ([id number]) Visit Report for 08/11/2020 Debridement Details Patient Name: Date of Service: Cory DA MS, [name] 08/11/2020 10:15 Cory M Medical Record Number: [id number] Patient Account Number: [id number] Date of Birth/Sex: Treating RN: [name] (64 y.o. M) Primary Care Provider: Velna [name] Other Clinician: Referring Provider: Treating Provider/Extender: [name] in Treatment: 7 Debridement Performed for Assessment: Wound #12 Right Metatarsal head first Performed By: Physician [name]., MD Debridement Type: Debridement Level of Consciousness (Pre-procedure): Awake and Alert Pre-procedure Verification/Time Out Yes - 11:46 Taken: Start Time: 11:46 T Area Debrided (L x W): otal 2.7 (cm) x 2.2 (cm) = 5.94 (cm) Tissue and other material debrided: Non-Viable, Callus, Subcutaneous Level: Skin/Subcutaneous Tissue Debridement Description: Excisional Instrument: Blade, Forceps Bleeding: Moderate Hemostasis Achieved: Silver Nitrate End Time: 11:47 Procedural Pain: 0 Post Procedural Pain: 0 Response to Treatment: Procedure was tolerated well Level of Consciousness (Post- Awake and Alert procedure): Post Debridement Measurements of Total Wound Length: (cm) 2.7 Width: (cm) 2.2 Depth: (cm) 0.7 Volume: (cm) 3.266 Character of Wound/Ulcer Post Debridement: Improved Post Procedure Diagnosis Same as Pre-procedure Electronic Signature(s) Signed: 08/12/2020 5:12:45 PM By: Cory Ham MD Entered By: [name] on 08/11/2020 12:01:23 -------------------------------------------------------------------------------- HPI Details Patient Name: Date of Service: Cory DA MS, [name] 08/11/2020 10:15 Cory M Medical Record Number: [id number] Patient Account Number: [id number] Date of Birth/Sex: Treating RN: [name] (64 y.o. M) Primary Care Provider: Velna [name] Other Clinician: Referring Provider: Treating Provider/Extender:  [name] in Treatment: 7 History of Present Illness Location: right first metatarsal head on the plantar aspect and the left big toe and the right fifth toe Quality: Patient reports No Pain. Severity: Patient states wound(s) are getting worse. Duration: Patient has had the wound for > 24 months prior to seeking treatment at the wound center Context: The wound would happen gradually Modifying Factors: Wound improving due to current treatment. he has been under podiatry care for over Cory year and Cory half ssociated Signs and Symptoms: Patient reports having: heaviness in both legs Cory HPI Description: This 64 year old gentleman who has had Cory long-standing neuropathy of both feet without history of diabetes mellitus has never been worked up for his neurological problem in the last 5 years or so. Most recently he has been seen by the podiatrist [name] who was been seeing them for about Cory year and Cory half and from what I understand has been treating him for bilateral ulceration on the feet, the right being in the region of the first metatarsal head plantar aspect and the left being in the area of the left big toe. He's had various antibiotics including Levaquin recently and this was then switched to Cipro and clindamycin. Due to the nature of this wound which has been there for Cory long while he has been recently referred to [name] for an opinion. An MRI was done in the middle of June which showed several small abscesses near the amputation site of the right great toe with draining and open wounds with surrounding cellulitis but no evidence of septic arthritis, osteomyelitis or pyomyositis. I understand at this stage he was taken to the operating room by [name] who debrided the wound and cleaned out the abscess. I understand ABIs were done bilaterally and they were within normal limits at [name] office. The patient is Cory smoker and is working on quitting  smoking. 07/13/2017 -- had Cory lower extremity arterial duplex evaluation which showed patent bilateral  lower extremity arteries without evidence of hemodynamically significant stenosis. His right ABI was 1.05 and the left was 0.95 He had Cory lower extremity venous duplex reflux evaluation done which showed significant venous incompetence in the bilateral common femoral, saphenofemoral junction, small saphenous vein and in the right great saphenous vein. With these findings I believe he will benefit from Cory consultation with the vascular surgeon regarding the endovenous ablation procedure. the patient's neurology appointment and dermatology appointment is still pending. He has had Cory x-ray done by his PCP and the results are not available. However he did see [name] who I understand has ordered Cory bilateral MRI of his feet. 07/26/2017 -- was seen by [name] on 07/20/2017. His impression was that he presented with minimal bilateral lower extremity peripheral arterial disease, bilateral lower extremity chronic venous insufficiency (C4) and radiculopathy versus neuropathy bilateral lower extremity. No arterial intervention was needed at the present time. He recommended maximal medical management at this stage for his atherosclerotic disease . He recommended compression to be continued and no venous intervention at the present time. He was offered follow-up in the Vein clinic in 3 months time. MR of the right foot done with and without contrast -- IMPRESSION:Wound on the plantar surface of the foot at approximately the level of first MTP joint with Cory tiny underlying soft tissue abscess. Large area of soft tissue edema subjacent to the wound is most compatible with granulation tissue. Negative for osteomyelitis about the first MTP joint. Mild edema and enhancement in the proximal phalanx of the little toe is stable compared to the prior examination and Cory be due to stress change. Infection is  possible but thought highly unlikely. Subcutaneous edema over the dorsum of the foot compatible with dependent change/cellulitis. the patient also has had Cory abdominal CT which shows Cory kidney mass and is going to have her MRI for this. He also has Cory urology opinion pending and Cory general surgeon's opinion regarding Cory colonic mass. 08/02/2017 -- his appointments with his other urology and surgical consultants is still later. He has an MRI of the left foot later this week. 08/09/2017 --MR of the left foot with and without contrast -- IMPRESSION:Large appearing skin wound on the medial aspect of the great toe without underlying abscess or osteomyelitis.First MTP osteoarthritis. Marked marrow edema in the medial sesamoid bone is likely related to osteoarthritis but could be due to sesamoiditis. 08/16/2017 -- the patient has had Cory surgical opinion and Cory urological opinion and Cory extensive surgery including possible colon resection, bladder surgery and kidney surgery is being planned. No date has been set for his procedure He continues to be off smoking 08/31/2017 -- he was seen by the neurologist for his multiple problems of neuropathic and radicular symptoms in both legs with Cory thorough workup done. MRIs were reviewed lab work was reviewed and the assessment was that he had severe neuropathy due to multifactorial reasons and Cory neuropathy panel of lab work was ordered and he would be reviewed back. Patient is also due for colonoscopy to be repeated before his bladder and kidney surgery. At the present time he would like [name] to continue with conservative and symptomatic wound care for both his feet. 10/05/2017 -- since I have seen him last time he has had Cory colonoscopy which was fairly normal except for Cory polyp and he has Cory urology appointment pending later today. He still continues to stay off smoking. 10/26/2017-- he returns after about 3 weeks and in  the meanwhile he was seen by Dr. Kellie Simmering, who reviewed  his venous studies and noted that there was very minimal enlargement of the bilateral great saphenous vein on the right and somewhat larger vein on the left but no consistent reflux throughout the great saphenous veins and there was also some right small saphenous veins which were enlarged with some reflux. However overall he thought that venous insufficiency was not significantly the cause of his ulceration and it was more of trophic ulcers and hence he recommended aggressive wound care, and at some stage he Cory need amputation of the left first toe and the right fifth toe. 11/09/2017 -- due to the inclement weather he has missed his urology appointment and is still awaiting the rescheduled one. Other than that he's been doing fairly well. 11/23/2017 -- he has had his cystoscopy done by his urologist and they found multiple areas of concern and he is going to be set up for Cory procedure sometime later in January. He is on complex care as far as his wounds go and seizures every 2-3 weeks 12/07/17 patient presents today for evaluation concerning his right great toe amputation site as well as his left great toe ulcer. He has been tolerating the dressing changes fairly well. With that being said there does not appear to be any significant discomfort at this point that he is experiencing although he has quite significant callous especially in regard to the amputation site of the right great toe. Nonetheless he has no evidence of infection which is good news No fevers, chills, nausea, or vomiting noted at this time. He has no pain. READMISSION 05/09/18; is Cory patient who hasn't been here in quite some time. He is not Cory diabetic but he has severe idiopathic peripheral neuropathy. He was here for review of Cory nonhealing wound on the right great toe amputation site and Cory large portion of the tip of the left great toe. Sometime after his last visit here he became ill he had abdominal issues infections and required  hospitalizations. He largely dropped off the map. He is putting silver alginate on the wounds. I note that he had arterial studies and saw Dr. Bridgett Larsson of vascular surgery was not felt to have an arterial issue. He also had reflux studies and he wears compression stockings and he is faithful with these. He had MRI of both of his feet that not show osteomyelitis question small abscess on the right. He tells me that the nonhealing wound on his right great toe amputation site is been there since the actual amputation which was in 2014 I note the area was aggressively debrided by Dr. Jacqualyn Posey of podiatry before he started coming to this clinic apparently there were abscess is present at that time. His last formal arterial studies were done in August 2018 which time his ABI was 1.05 on the right and 0.95 on the left he had biphasic and triphasic waveforms on the left and monophasic and biphasic waveforms on the right. He did not have TBI's. ABIs in our clinic today were 0.8 bilaterally. The patient states he is not Cory diabetic. He does have idiopathic peripheral neuropathy. He is Cory smoker but is trying to quit with Chantix. 05/16/18; x-rays of his bilateral feet were negative for osteomyelitis. He was noted to have Cory chronic deformity of the head of the second metatarsal which Cory be due to previous osteonecrosis there was no definite underlying bone destruction to suggest osteomyelitis on either side. He came in  with the extensive callus over his right great toe amputation site and the left great toe did extensive debridement last week and another extensive debridement today.patient is changing his dressing himself with silver alginate 05/30/18; still buildup of thick callus covering thick subcutaneous tissue around both of these areas. This requires an extensive debridement on both sides. I have discussed offloading this area with the patient. I think he inverts when he walks. His foot sizes beyond what we can put  in Cory Darco forefoot off loader or probably what we can put in Cory total contact cast. 06/13/18; still Cory remarkable buildup of callus over the right first toe amputation site. The left first toe has 2 open wounds one medially and 1 at the tip of the toe. Surrounding callus is also not back here. We've been using silver alginate. He has Cory darco forefoot off loader now on the right foot.his own shoe on the left foot. Would like to try Cory total contact cast on the right. He is making arrangements for someone to drive him here in 2 weeks. 06/29/18; still Cory buildup of callus over the right first toe amputation site although not as significant as previously. The left first toe has 2 open wounds one medially and one at the tip surrounding callus as well. We've been using silver alginate. He comes in today with the right for Cory total contact cast which I'll use on the right 07/04/18 on evaluation today patient appears to be doing decently well in regard to the cast. Unfortunately he did have Cory little area that rubbed on the lateral aspect of his leg the cast actually cracked on the medial aspect this happened he states just yesterday. He has not been walking in the cast without the boot portion on. With that being said he states that he is unsure of exactly what happened with the crack. Nonetheless this does seem to have caused Cory little bit of rubbing on the lateral aspect I think due to the integrity of the cast being compromised. Nonetheless I advised him that if this happens in the future he needs to let us know soon as possible. Nonetheless I do feel like the cast has been of benefit some of the area on the distal portion of his great toe amputation site actually appears to be doing better. 07/12/18 on evaluation today patient appears to be doing very well in regard to his right great toe invitation site. This seems to be showing signs of excellent improvement which is great news. There does not appear to be any  evidence of infection at this time. With that being said he has been tolerating the dressing changes without complication. There appears to be no evidence of infection and again this is barely open at this point. The total contact cast has been of excellent benefit for him. I believe this will likely be completely closed and ready next week that we can switch to Cory cast on the left foot. He states he Cory want to take Cory week's break. 07/19/18 on evaluation today patient appears to be doing better in regard to both ulceration areas. The right foot actually appears to be completely healed he still has some callous but no open wound. The left foot/great toe actually seems to be better after last week's debridement overall the appearance of the wound is greatly improved. 08/09/18 on evaluation today patient actually appears to be doing Cory little bit worse in regard to the right metatarsal invitation site. He's  been tolerating the dressing changes without complication. With that being said he does note that the area actually cracked open as of today when he was in the shower trying to scrub it. Nonetheless it does appear that the Sherrie Mustache that previously was open and draining has subsequently reopened and is draining again. Nonetheless as I was looking at him and performing the debridement today I did inquire about whether or not we had ever biopsy the area as I was concerned about the possibility for something along the lines of Cory work type virus/infection causing the excessive callous buildup. Especially since he seems to grow much more than what would just be traditionally expected by friction alone. He subsequently told me that he did have this biopsied somewhere around 1-2 years ago by Dr. Ardelle Anton and that it showed that he had Cory wart infection at that point. Nonetheless he was never referred to dermatology better Ardelle Anton attempted to cut it out according to the patient. Nonetheless that Cory be part of the  big issue with what we're dealing with here and without treating the wart infection we Cory not actually get this to heal and stay closed. READMISSION 12/07/2018 The patient is back in clinic today essentially with wounds in the same area and condition is that I think the last 2 times he has been here. This includes the right first metatarsal head in the setting of Cory previous right great toe amputation and Cory large part of the medial part of his left great toe. The patient states he has been dealing with this for years dating back to 2005 on and off. That he is never found this to be totally healed. When he was last in this clinic he had this biopsied shave biopsies which showed hyperkeratotic and parakeratotic debris. Epithelium with papillary features negative for malignancy. He has been using silver alginate. He has healing sandals which he uses Felt on the bottom of these. When he was here last time we put him Cory total contact cast at least for Cory while and the patient states on the right that helped with the buildup of hyperkeratotic tissue and that seems to be reflective in our notes. He also states that was previously biopsied by Dr. Ardelle Anton of podiatry. The patient has Cory severe idiopathic peripheral neuropathy which he attributes to Cory brown recluse spider bite in 2002 or so. He is not Cory diabetic The patient had arterial studies in this showed an ABI in the right of 1.05 on the left was 0.95. TBI's were not done. He had monophasic waveforms at the right posterior tibial biphasic at the dorsalis pedis. On the left he was biphasic at the posterior tibial and triphasic at the dorsalis pedis. 1/17; this is Cory patient with Cory very challenging issue was readmitted to the clinic last week. He has Cory large area over the first right MTP in the setting of Cory previous right great toe amputation. On presentation this is covered with copious amounts of hyperkeratotic thick callus nonviable tissue. When this  is debridement both on this occasion and previous occasions in this clinic he has Cory very boggy presentation to the subcutaneous tissue which looks somewhat atypical. He has Cory similar area on the left great toe from the tip of the toe down the medial aspect beyond the interphalangeal joint. He's had previous MRIs of both of his feet. On the right that showed small abscesses and he was taken to the OR by podiatry for debridement and cleaning  out of the abscesses. An MRI did not show osteomyelitis of the left first toe. Both the MRIs were done in 2018. He saw Dr. Vallarie Mare at the time who did not think he had significant macrovascular disease. He is not Cory diabetic but he has idiopathic peripheral neuropathy and was Cory significant smoker. When he was in the clinic earlier in 2019 he had biopsies done of both areas that showed hyperkeratotic material but no malignancy. X-rays I ordered last week did not show osteomyelitis on the left but did suggest osteomyelitis at the tip of the left great toe. I looked at the x-rays myself this is Cory subtle finding it is only seen on the lateral film. I did Cory very significant debridement on him last week bilaterally in both the wound areas look better. When he was previously in our clinic the area on the right did improve with Cory total contact cast. He dropped out of the schedule at that point I'm not exactly sure why 1/24; the biopsy I did of the area over his right first metatarsal head showed the possibility of verruca vulgaris. Superimposed changes of lichen simplex chronicus. If this is Cory plantar wart this would be extremely large. I think he would need topical destructive therapy and I am going to refer him to dermatology. There was no suggestion of malignancy. Previous biopsies in this clinic on both his areas did not show evidence of malignancy either. Until we get dermatology I am going to continue with silver alginate. X-rays suggested osteomyelitis of the left toe I am  going to continue him on trimethoprim sulfamethoxazole 2/3; not much change in this area on the right first met head. Extensive dark eschar formation around the central area of nonviable subcutaneous tissue looking like Cory boggy subcutaneous tissue. Using Cory #10 scalpel and pickups I removed all of this. I have tried this before and this man but I plan to put him in Cory total contact cast next week Using Cory #5 curette debridement of the left great toe He has an appointment with dermatology at [facility] on the 25th 2/11; right first metatarsal head after last week's extensive debridement is right back to the way it was with Cory nonviable subcutaneous tissue and thick surrounding eschar. The left great toe looks about the same. The patient could not have Cory total contact cast today because he had household problems [leaking roof] but I am not going to put Cory cast on him until I have information from the dermatologist about this. READMISSION 06/17/2020 Mr. Stockard is now Cory 64 year old man that we have had in this clinic on at least 2 occasions previously. When he was last here in February 2020 I referred him to dermatology at [facility] for their review of large wounds on the right first metatarsal head with surrounding severe chronic hyperkeratosis. This was biopsied on at least 2 occasions in our clinic and I think at [facility] as well that did not show an underlying malignancy. I have reviewed the consult from Dr. Sharlette Dense dermatology from 01/23/2019. At that time the patient had wounds on the plantar right first metatarsal head as well as the plantar left great toe. From what he is saying the left great toe healed however he recently had to have Cory left TMA by Dr. Sharol Given because of osteomyelitis in the forefoot we did not actually get Cory look at his foot today. Dermatology at [facility] apparently referred him to Dr. Luetta Nutting of plastic surgery and an operative excision of  this area was planned however apparently they ran into the  pandemic. The procedure was repetitively cancel and the patient did not keep his final appointment which I think was earlier this year he was also seen in February of this year at the wound care center in [facility] by Dr. Zigmund Daniel. They were applying Silvadene cream and Xeroform which she is essentially doing now. I see in his notes that there was still some concern about underlying malignancy although would be difficult to believe as long as this is been there that there would not be more extensive damage at this point in any case he is back in our clinic for review of this. He had an x-ray of the foot on 01/23/2020 that was negative for osteomyelitis acute fracture. Past medical history; idiopathic peripheral neuropathy, right great toe amputation in 2011, lumbar spinal stenosis, left transmetatarsal amputation on 05/30/2020 by Dr. Sharol Given and more recently apparently Cory slight wound dehiscence. He also had Cory fracture of his left tibial plateau sometime in 2020. ABI in our clinic was 0.95 8/9-Patient returns after being established in the clinic last time, he has Cory new open area on the plantar aspect of his right second toe, the right foot plantar ulcer below the great toe on the right looks slightly smaller, using PolyMem. Patient attends to his surgeon for the left foot wound 8/23; this is Cory patient with Cory refractory wound over his right first metatarsal head in the setting of severe surrounding hyperkeratosis. I had referred him to dermatology subsequently came to the attention of Dr. Vernona Rieger and Dr. Zigmund Daniel at [facility]. He also had Cory left TMA by Dr. Sharol Given on the left foot because of osteomyelitis I believe. He is in bilateral surgical shoes. 9/13; refractory wound over the right first metatarsal head with surrounding hyperkeratosis Verrucous skin. He had Cory left transmetatarsal amputation by Dr. Sharol Given. We have not been following this however this apparently is still not closed. I have looked over his notes from  [facility]. This is well summarized by Cory note from Dr. Zigmund Daniel on February 24. The patient had had several biopsies of the skin of the first toe metatarsal head. As well as the left great toe which has since been amputated. The pathology results showed Stratus corneum with hyperkeratosis and parakeratosis. An underlying verricous carcinoma could not be excluded. I think the patient was given follow-up but I will actually think that he kept his appointments. He was sent back to follow-up with him with them again in February 2021. Dr. Luetta Nutting who is the plastic surgeon recommended wound care rather than extensive debridement. I am very doubtful that this represents any form of malignancy. This patient has been dealing with this wound for years and I think we would have had Cory declaration of status well before this if this were any form of malignancy. I also do not believe he has an arterial issue. ABI in the clinic here was 0.95. He has easily palpable peripheral pulses. Electronic Signature(s) Signed: 08/12/2020 5:12:45 PM By: Cory Ham MD Entered By: [name] on 08/11/2020 12:05:08 -------------------------------------------------------------------------------- Physical Exam Details Patient Name: Date of Service: Cory DA MS, [name] 08/11/2020 10:15 Cory M Medical Record Number: [id number] Patient Account Number: [id number] Date of Birth/Sex: Treating RN: Cory 28, [name] (64 y.o. M) Primary Care Provider: Velna [name] Other Clinician: Referring Provider: Treating Provider/Extender: [name] in Treatment: 7 Cardiovascular Both dorsalis pedis and posterior tibial pulses are easily palpable on the right. Notes Wound  exam; the wound surface does not look too bad. Fairly senescent looking. Again he has thick callus raised edges and the verrucous looking skin around this or on top of it. I used Cory #10 scalpel and pickups to remove the nonviable circumference. There is Cory  lot of bleeding tissue underneath this. Hemostasis with silver nitrate. Electronic Signature(s) Signed: 08/12/2020 5:12:45 PM By: Cory Ham MD Entered By: [name] on 08/11/2020 12:06:12 -------------------------------------------------------------------------------- Physician Orders Details Patient Name: Date of Service: Cory DA MS, [name] 08/11/2020 10:15 Cory M Medical Record Number: [id number] Patient Account Number: [id number] Date of Birth/Sex: Treating RN: [birth year] (64 y.o. [name] Primary Care Provider: Velna [name] Other Clinician: Referring Provider: Treating Provider/Extender: [name] in Treatment: 7 Verbal / Phone Orders: No Diagnosis Coding ICD-10 Coding Code Description L97.518 Non-pressure chronic ulcer of other part of right foot with other specified severity G90.09 Other idiopathic peripheral autonomic neuropathy Follow-up Appointments Return appointment in 3 weeks. Dressing Change Frequency Wound #12 Right Metatarsal head first Change Dressing every other day. Wound Cleansing Wound #12 Right Metatarsal head first Clean wound with Wound Cleanser - or normal saline Primary Wound Dressing Wound #12 Right Metatarsal head first Polymem Silver Secondary Dressing Wound #12 Right Metatarsal head first Kerlix/Rolled Gauze Dry Gauze Other: - felt callous pad or foam donut Off-Loading Open toe surgical shoe to: - right foot [facility] for wound care. Alvis Lemmings Electronic Signature(s) Signed: 08/12/2020 5:12:45 PM By: Cory Ham MD Signed: 08/13/2020 5:57:30 PM By: Levan Hurst RN, BSN Entered By: Levan Hurst on 08/11/2020 11:51:02 -------------------------------------------------------------------------------- Problem List Details Patient Name: Date of Service: Cory DA MS, [name] 08/11/2020 10:15 Cory M Medical Record Number: [id number] Patient Account Number:  [id number] Date of Birth/Sex: Treating RN: [birth year] (64 y.o. [name] Primary Care Provider: Velna [name] Other Clinician: Referring Provider: Treating Provider/Extender: [name] in Treatment: 7 Active Problems ICD-10 Encounter Code Description Active Date MDM Diagnosis L97.518 Non-pressure chronic ulcer of other part of right foot with other specified 06/17/2020 No Yes severity G90.09 Other idiopathic peripheral autonomic neuropathy 06/17/2020 No Yes Inactive Problems Resolved Problems Electronic Signature(s) Signed: 08/12/2020 5:12:45 PM By: Cory Ham MD Entered By: [name] on 08/11/2020 12:01:00 -------------------------------------------------------------------------------- Progress Note Details Patient Name: Date of Service: Cory DA MS, [name] 08/11/2020 10:15 Cory M Medical Record Number: [id number] Patient Account Number: [id number] Date of Birth/Sex: Treating RN: [birth year]/[name] (64 y.o. M) Primary Care Provider: Velna [name] Other Clinician: Referring Provider: Treating Provider/Extender: [name] in Treatment: 7 Subjective History of Present Illness (HPI) The following HPI elements were documented for the patient's wound: Location: right first metatarsal head on the plantar aspect and the left big toe and the right fifth toe Quality: Patient reports No Pain. Severity: Patient states wound(s) are getting worse. Duration: Patient has had the wound for > 24 months prior to seeking treatment at the wound center Context: The wound would happen gradually Modifying Factors: Wound improving due to current treatment. he has been under podiatry care for over Cory year and Cory half Associated Signs and Symptoms: Patient reports having: heaviness in both legs This 64 year old gentleman who has had Cory long-standing neuropathy of both feet without history of diabetes mellitus has never been worked up for  his neurological problem in the last 5 years or so. Most recently he has been seen by the podiatrist [name] who was been  seeing them for about Cory year and Cory half and from what I understand has been treating him for bilateral ulceration on the feet, the right being in the region of the first metatarsal head plantar aspect and the left being in the area of the left big toe. He's had various antibiotics including Levaquin recently and this was then switched to Cipro and clindamycin. Due to the nature of this wound which has been there for Cory long while he has been recently referred to [name] for an opinion. An MRI was done in the middle of June which showed several small abscesses near the amputation site of the right great toe with draining and open wounds with surrounding cellulitis but no evidence of septic arthritis, osteomyelitis or pyomyositis. I understand at this stage he was taken to the operating room by [name] who debrided the wound and cleaned out the abscess. I understand ABIs were done bilaterally and they were within normal limits at [name] office. The patient is Cory smoker and is working on quitting smoking. 07/13/2017 -- had Cory lower extremity arterial duplex evaluation which showed patent bilateral lower extremity arteries without evidence of hemodynamically significant stenosis. His right ABI was 1.05 and the left was 0.95 He had Cory lower extremity venous duplex reflux evaluation done which showed significant venous incompetence in the bilateral common femoral, saphenofemoral junction, small saphenous vein and in the right great saphenous vein. With these findings I believe he will benefit from Cory consultation with the vascular surgeon regarding the endovenous ablation procedure. the patient's neurology appointment and dermatology appointment is still pending. He has had Cory x-ray done by his PCP and the results are not available. However he did see [name] who I  understand has ordered Cory bilateral MRI of his feet. 07/26/2017 -- was seen by [name] on 07/20/2017. His impression was that he presented with minimal bilateral lower extremity peripheral arterial disease, bilateral lower extremity chronic venous insufficiency (C4) and radiculopathy versus neuropathy bilateral lower extremity. No arterial intervention was needed at the present time. He recommended maximal medical management at this stage for his atherosclerotic disease . He recommended compression to be continued and no venous intervention at the present time. He was offered follow-up in the Vein clinic in 3 months time. MR of the right foot done with and without contrast -- IMPRESSION:Wound on the plantar surface of the foot at approximately the level of first MTP joint with Cory tiny underlying soft tissue abscess. Large area of soft tissue edema subjacent to the wound is most compatible with granulation tissue. Negative for osteomyelitis about the first MTP joint. Mild edema and enhancement in the proximal phalanx of the little toe is stable compared to the prior examination and Cory be due to stress change. Infection is possible but thought highly unlikely. Subcutaneous edema over the dorsum of the foot compatible with dependent change/cellulitis. the patient also has had Cory abdominal CT which shows Cory kidney mass and is going to have her MRI for this. He also has Cory urology opinion pending and Cory general surgeon's opinion regarding Cory colonic mass. 08/02/2017 -- his appointments with his other urology and surgical consultants is still later. He has an MRI of the left foot later this week. 08/09/2017 --MR of the left foot with and without contrast -- IMPRESSION:Large appearing skin wound on the medial aspect of the great toe without underlying abscess or osteomyelitis.First MTP osteoarthritis. Marked marrow edema in the medial sesamoid bone is likely  related to osteoarthritis but could be due to  sesamoiditis. 08/16/2017 -- the patient has had Cory surgical opinion and Cory urological opinion and Cory extensive surgery including possible colon resection, bladder surgery and kidney surgery is being planned. No date has been set for his procedure He continues to be off smoking 08/31/2017 -- he was seen by the neurologist for his multiple problems of neuropathic and radicular symptoms in both legs with Cory thorough workup done. MRIs were reviewed lab work was reviewed and the assessment was that he had severe neuropathy due to multifactorial reasons and Cory neuropathy panel of lab work was ordered and he would be reviewed back. Patient is also due for colonoscopy to be repeated before his bladder and kidney surgery. At the present time he would like [name] to continue with conservative and symptomatic wound care for both his feet. 10/05/2017 -- since I have seen him last time he has had Cory colonoscopy which was fairly normal except for Cory polyp and he has Cory urology appointment pending later today. He still continues to stay off smoking. 10/26/2017-- he returns after about 3 weeks and in the meanwhile he was seen by Dr. Kellie Simmering, who reviewed his venous studies and noted that there was very minimal enlargement of the bilateral great saphenous vein on the right and somewhat larger vein on the left but no consistent reflux throughout the great saphenous veins and there was also some right small saphenous veins which were enlarged with some reflux. However overall he thought that venous insufficiency was not significantly the cause of his ulceration and it was more of trophic ulcers and hence he recommended aggressive wound care, and at some stage he Cory need amputation of the left first toe and the right fifth toe. 11/09/2017 -- due to the inclement weather he has missed his urology appointment and is still awaiting the rescheduled one. Other than that he's been doing fairly well. 11/23/2017 -- he has had his  cystoscopy done by his urologist and they found multiple areas of concern and he is going to be set up for Cory procedure sometime later in January. He is on complex care as far as his wounds go and seizures every 2-3 weeks 12/07/17 patient presents today for evaluation concerning his right great toe amputation site as well as his left great toe ulcer. He has been tolerating the dressing changes fairly well. With that being said there does not appear to be any significant discomfort at this point that he is experiencing although he has quite significant callous especially in regard to the amputation site of the right great toe. Nonetheless he has no evidence of infection which is good news No fevers, chills, nausea, or vomiting noted at this time. He has no pain. READMISSION 05/09/18; is Cory patient who hasn't been here in quite some time. He is not Cory diabetic but he has severe idiopathic peripheral neuropathy. He was here for review of Cory nonhealing wound on the right great toe amputation site and Cory large portion of the tip of the left great toe. Sometime after his last visit here he became ill he had abdominal issues infections and required hospitalizations. He largely dropped off the map. He is putting silver alginate on the wounds. I note that he had arterial studies and saw Dr. Bridgett Larsson of vascular surgery was not felt to have an arterial issue. He also had reflux studies and he wears compression stockings and he is faithful with these. He had MRI of  both of his feet that not show osteomyelitis question small abscess on the right. He tells me that the nonhealing wound on his right great toe amputation site is been there since the actual amputation which was in 2014 I note the area was aggressively debrided by Dr. Jacqualyn Posey of podiatry before he started coming to this clinic apparently there were abscess is present at that time. His last formal arterial studies were done in August 2018 which time his ABI was 1.05  on the right and 0.95 on the left he had biphasic and triphasic waveforms on the left and monophasic and biphasic waveforms on the right. He did not have TBI's. ABIs in our clinic today were 0.8 bilaterally. The patient states he is not Cory diabetic. He does have idiopathic peripheral neuropathy. He is Cory smoker but is trying to quit with Chantix. 05/16/18; x-rays of his bilateral feet were negative for osteomyelitis. He was noted to have Cory chronic deformity of the head of the second metatarsal which Cory be due to previous osteonecrosis there was no definite underlying bone destruction to suggest osteomyelitis on either side. He came in with the extensive callus over his right great toe amputation site and the left great toe did extensive debridement last week and another extensive debridement today.patient is changing his dressing himself with silver alginate 05/30/18; still buildup of thick callus covering thick subcutaneous tissue around both of these areas. This requires an extensive debridement on both sides. I have discussed offloading this area with the patient. I think he inverts when he walks. His foot sizes beyond what we can put in Cory Darco forefoot off loader or probably what we can put in Cory total contact cast. 06/13/18; still Cory remarkable buildup of callus over the right first toe amputation site. The left first toe has 2 open wounds one medially and 1 at the tip of the toe. Surrounding callus is also not back here. We've been using silver alginate. He has Cory darco forefoot off loader now on the right foot.his own shoe on the left foot. Would like to try Cory total contact cast on the right. He is making arrangements for someone to drive him here in 2 weeks. 06/29/18; still Cory buildup of callus over the right first toe amputation site although not as significant as previously. The left first toe has 2 open wounds one medially and one at the tip surrounding callus as well. We've been using silver  alginate. He comes in today with the right for Cory total contact cast which I'll use on the right 07/04/18 on evaluation today patient appears to be doing decently well in regard to the cast. Unfortunately he did have Cory little area that rubbed on the lateral aspect of his leg the cast actually cracked on the medial aspect this happened he states just yesterday. He has not been walking in the cast without the boot portion on. With that being said he states that he is unsure of exactly what happened with the crack. Nonetheless this does seem to have caused Cory little bit of rubbing on the lateral aspect I think due to the integrity of the cast being compromised. Nonetheless I advised him that if this happens in the future he needs to let us know soon as possible. Nonetheless I do feel like the cast has been of benefit some of the area on the distal portion of his great toe amputation site actually appears to be doing better. 07/12/18 on evaluation today patient  appears to be doing very well in regard to his right great toe invitation site. This seems to be showing signs of excellent improvement which is great news. There does not appear to be any evidence of infection at this time. With that being said he has been tolerating the dressing changes without complication. There appears to be no evidence of infection and again this is barely open at this point. The total contact cast has been of excellent benefit for him. I believe this will likely be completely closed and ready next week that we can switch to Cory cast on the left foot. He states he Cory want to take Cory week's break. 07/19/18 on evaluation today patient appears to be doing better in regard to both ulceration areas. The right foot actually appears to be completely healed he still has some callous but no open wound. The left foot/great toe actually seems to be better after last week's debridement overall the appearance of the wound is greatly  improved. 08/09/18 on evaluation today patient actually appears to be doing Cory little bit worse in regard to the right metatarsal invitation site. He's been tolerating the dressing changes without complication. With that being said he does note that the area actually cracked open as of today when he was in the shower trying to scrub it. Nonetheless it does appear that the Sherrie Mustache that previously was open and draining has subsequently reopened and is draining again. Nonetheless as I was looking at him and performing the debridement today I did inquire about whether or not we had ever biopsy the area as I was concerned about the possibility for something along the lines of Cory work type virus/infection causing the excessive callous buildup. Especially since he seems to grow much more than what would just be traditionally expected by friction alone. He subsequently told me that he did have this biopsied somewhere around 1-2 years ago by Dr. Ardelle Anton and that it showed that he had Cory wart infection at that point. Nonetheless he was never referred to dermatology better Ardelle Anton attempted to cut it out according to the patient. Nonetheless that Cory be part of the big issue with what we're dealing with here and without treating the wart infection we Cory not actually get this to heal and stay closed. READMISSION 12/07/2018 The patient is back in clinic today essentially with wounds in the same area and condition is that I think the last 2 times he has been here. This includes the right first metatarsal head in the setting of Cory previous right great toe amputation and Cory large part of the medial part of his left great toe. The patient states he has been dealing with this for years dating back to 2005 on and off. That he is never found this to be totally healed. When he was last in this clinic he had this biopsied shave biopsies which showed hyperkeratotic and parakeratotic debris. Epithelium with papillary features  negative for malignancy. He has been using silver alginate. He has healing sandals which he uses Felt on the bottom of these. When he was here last time we put him Cory total contact cast at least for Cory while and the patient states on the right that helped with the buildup of hyperkeratotic tissue and that seems to be reflective in our notes. He also states that was previously biopsied by Dr. Ardelle Anton of podiatry. The patient has Cory severe idiopathic peripheral neuropathy which he attributes to Cory brown recluse spider bite in  2002 or so. He is not Cory diabetic The patient had arterial studies in this showed an ABI in the right of 1.05 on the left was 0.95. TBI's were not done. He had monophasic waveforms at the right posterior tibial biphasic at the dorsalis pedis. On the left he was biphasic at the posterior tibial and triphasic at the dorsalis pedis. 1/17; this is Cory patient with Cory very challenging issue was readmitted to the clinic last week. He has Cory large area over the first right MTP in the setting of Cory previous right great toe amputation. On presentation this is covered with copious amounts of hyperkeratotic thick callus nonviable tissue. When this is debridement both on this occasion and previous occasions in this clinic he has Cory very boggy presentation to the subcutaneous tissue which looks somewhat atypical. He has Cory similar area on the left great toe from the tip of the toe down the medial aspect beyond the interphalangeal joint. He's had previous MRIs of both of his feet. On the right that showed small abscesses and he was taken to the OR by podiatry for debridement and cleaning out of the abscesses. An MRI did not show osteomyelitis of the left first toe. Both the MRIs were done in 2018. He saw Dr. Vallarie Mare at the time who did not think he had significant macrovascular disease. He is not Cory diabetic but he has idiopathic peripheral neuropathy and was Cory significant smoker. When he was in the clinic  earlier in 2019 he had biopsies done of both areas that showed hyperkeratotic material but no malignancy. X-rays I ordered last week did not show osteomyelitis on the left but did suggest osteomyelitis at the tip of the left great toe. I looked at the x-rays myself this is Cory subtle finding it is only seen on the lateral film. I did Cory very significant debridement on him last week bilaterally in both the wound areas look better. When he was previously in our clinic the area on the right did improve with Cory total contact cast. He dropped out of the schedule at that point I'm not exactly sure why 1/24; the biopsy I did of the area over his right first metatarsal head showed the possibility of verruca vulgaris. Superimposed changes of lichen simplex chronicus. If this is Cory plantar wart this would be extremely large. I think he would need topical destructive therapy and I am going to refer him to dermatology. There was no suggestion of malignancy. Previous biopsies in this clinic on both his areas did not show evidence of malignancy either. Until we get dermatology I am going to continue with silver alginate. X-rays suggested osteomyelitis of the left toe I am going to continue him on trimethoprim sulfamethoxazole 2/3; not much change in this area on the right first met head. Extensive dark eschar formation around the central area of nonviable subcutaneous tissue looking like Cory boggy subcutaneous tissue. Using Cory #10 scalpel and pickups I removed all of this. I have tried this before and this man but I plan to put him in Cory total contact cast next week ooUsing Cory #5 curette debridement of the left great toe ooHe has an appointment with dermatology at [facility] on the 25th 2/11; right first metatarsal head after last week's extensive debridement is right back to the way it was with Cory nonviable subcutaneous tissue and thick surrounding eschar. The left great toe looks about the same. The patient could not have Cory  total contact cast today  because he had household problems [leaking roof] but I am not going to put Cory cast on him until I have information from the dermatologist about this. READMISSION 06/17/2020 Mr. Mendonsa is now Cory 64 year old man that we have had in this clinic on at least 2 occasions previously. When he was last here in February 2020 I referred him to dermatology at [facility] for their review of large wounds on the right first metatarsal head with surrounding severe chronic hyperkeratosis. This was biopsied on at least 2 occasions in our clinic and I think at [facility] as well that did not show an underlying malignancy. I have reviewed the consult from Dr. Sharlette Dense dermatology from 01/23/2019. At that time the patient had wounds on the plantar right first metatarsal head as well as the plantar left great toe. From what he is saying the left great toe healed however he recently had to have Cory left TMA by Dr. Sharol Given because of osteomyelitis in the forefoot we did not actually get Cory look at his foot today. Dermatology at [facility] apparently referred him to Dr. Luetta Nutting of plastic surgery and an operative excision of this area was planned however apparently they ran into the pandemic. The procedure was repetitively cancel and the patient did not keep his final appointment which I think was earlier this year he was also seen in February of this year at the wound care center in [facility] by Dr. Zigmund Daniel. They were applying Silvadene cream and Xeroform which she is essentially doing now. I see in his notes that there was still some concern about underlying malignancy although would be difficult to believe as long as this is been there that there would not be more extensive damage at this point in any case he is back in our clinic for review of this. He had an x-ray of the foot on 01/23/2020 that was negative for osteomyelitis acute fracture. Past medical history; idiopathic peripheral neuropathy, right great toe amputation in  2011, lumbar spinal stenosis, left transmetatarsal amputation on 05/30/2020 by Dr. Sharol Given and more recently apparently Cory slight wound dehiscence. He also had Cory fracture of his left tibial plateau sometime in 2020. ABI in our clinic was 0.95 8/9-Patient returns after being established in the clinic last time, he has Cory new open area on the plantar aspect of his right second toe, the right foot plantar ulcer below the great toe on the right looks slightly smaller, using PolyMem. Patient attends to his surgeon for the left foot wound 8/23; this is Cory patient with Cory refractory wound over his right first metatarsal head in the setting of severe surrounding hyperkeratosis. I had referred him to dermatology subsequently came to the attention of Dr. Vernona Rieger and Dr. Zigmund Daniel at [facility]. He also had Cory left TMA by Dr. Sharol Given on the left foot because of osteomyelitis I believe. He is in bilateral surgical shoes. 9/13; refractory wound over the right first metatarsal head with surrounding hyperkeratosis Verrucous skin. He had Cory left transmetatarsal amputation by Dr. Sharol Given. We have not been following this however this apparently is still not closed. I have looked over his notes from [facility]. This is well summarized by Cory note from Dr. Zigmund Daniel on February 24. The patient had had several biopsies of the skin of the first toe metatarsal head. As well as the left great toe which has since been amputated. The pathology results showed Stratus corneum with hyperkeratosis and parakeratosis. An underlying verricous carcinoma could not be excluded. I think the patient  was given follow-up but I will actually think that he kept his appointments. He was sent back to follow-up with him with them again in February 2021. Dr. Luetta Nutting who is the plastic surgeon recommended wound care rather than extensive debridement. I am very doubtful that this represents any form of malignancy. This patient has been dealing with this wound for years and I think we  would have had Cory declaration of status well before this if this were any form of malignancy. I also do not believe he has an arterial issue. ABI in the clinic here was 0.95. He has easily palpable peripheral pulses. Objective Constitutional Vitals Time Taken: 11:12 AM, Height: 80 in, Weight: 350 lbs, BMI: 38.4, Temperature: 98.4 F, Pulse: 83 bpm, Respiratory Rate: 18 breaths/min, Blood Pressure: 158/79 mmHg. Cardiovascular Both dorsalis pedis and posterior tibial pulses are easily palpable on the right. General Notes: Wound exam; the wound surface does not look too bad. Fairly senescent looking. Again he has thick callus raised edges and the verrucous looking skin around this or on top of it. I used Cory #10 scalpel and pickups to remove the nonviable circumference. There is Cory lot of bleeding tissue underneath this. Hemostasis with silver nitrate. Integumentary (Hair, Skin) Wound #12 status is Open. Original cause of wound was Gradually Appeared. The wound is located on the Right Metatarsal head first. The wound measures 2.7cm length x 2.2cm width x 0.7cm depth; 4.665cm^2 area and 3.266cm^3 volume. There is Fat Layer (Subcutaneous Tissue) exposed. There is no tunneling or undermining noted. There is Cory medium amount of serosanguineous drainage noted. The wound margin is thickened. There is large (67-100%) red granulation within the wound bed. There is no necrotic tissue within the wound bed. Wound #13 status is Healed - Epithelialized. Original cause of wound was Gradually Appeared. The wound is located on the Right T Second. The wound oe measures 0cm length x 0cm width x 0cm depth; 0cm^2 area and 0cm^3 volume. There is no tunneling or undermining noted. There is Cory none present amount of drainage noted. The wound margin is distinct with the outline attached to the wound base. There is no granulation within the wound bed. There is no necrotic tissue within the wound bed. Assessment Active  Problems ICD-10 Non-pressure chronic ulcer of other part of right foot with other specified severity Other idiopathic peripheral autonomic neuropathy Procedures Wound #12 Pre-procedure diagnosis of Wound #12 is Cory Neuropathic Ulcer-Non Diabetic located on the Right Metatarsal head first . There was Cory Excisional Skin/Subcutaneous Tissue Debridement with Cory total area of 5.94 sq cm performed by [name]., MD. With the following instrument(s): Blade, and Forceps to remove Non-Viable tissue/material. Material removed includes Callus and Subcutaneous Tissue and. No specimens were taken. Cory time out was conducted at 11:46, prior to the start of the procedure. Cory Moderate amount of bleeding was controlled with Silver Nitrate. The procedure was tolerated well with Cory pain level of 0 throughout and Cory pain level of 0 following the procedure. Post Debridement Measurements: 2.7cm length x 2.2cm width x 0.7cm depth; 3.266cm^3 volume. Character of Wound/Ulcer Post Debridement is improved. Post procedure Diagnosis Wound #12: Same as Pre-Procedure Plan Follow-up Appointments: Return appointment in 3 weeks. Dressing Change Frequency: Wound #12 Right Metatarsal head first: Change Dressing every other day. Wound Cleansing: Wound #12 Right Metatarsal head first: Clean wound with Wound Cleanser - or normal saline Primary Wound Dressing: Wound #12 Right Metatarsal head first: Polymem Silver Secondary Dressing: Wound #12 Right Metatarsal  head first: Kerlix/Rolled Gauze Dry Gauze Other: - felt callous pad or foam donut Off-Loading: Open toe surgical shoe to: - right foot Home Health: [employer] skilled nursing for wound care. - [facility]. We continued with polymen silver as Cory primary dressing 2. If he did not have an abnormality on the left transmetatarsal amputation site I would offer him Cory total contact last for aggressive offloading 3. I am not quite sure how this wound and its  circumference got to look exactly like they are other than to say I do not believe this is any form of malignancy. The biopsies have not shown this and the extent of time that this is been present surely this would have declared itself. 4. The patient did not follow-up with Cory plastic surgeon with regards to excision [Dr. Marks] Dr. Luetta Nutting referred him to the clinic at [facility] I do not think the patient followed up with this. 5. He uses crutches in an attempt to offload this. Electronic Signature(s) Signed: 08/12/2020 5:12:45 PM By: Cory Ham MD Entered By: [name] on 08/11/2020 12:09:09 -------------------------------------------------------------------------------- SuperBill Details Patient Name: Date of Service: Cory DA MS, [name] 08/11/2020 Medical Record Number: [id number] Patient Account Number: [id number] Date of Birth/Sex: Treating RN: [birth year] [name] (64 y.o. M) Primary Care Provider: Velna [name] Other Clinician: Referring Provider: Treating Provider/Extender: [name] in Treatment: 7 Diagnosis Coding ICD-10 Codes Code Description [phone] Non-pressure chronic ulcer of other part of right foot with other specified severity G90.09 Other idiopathic peripheral autonomic neuropathy Facility Procedures CPT4 Code: [id number] Description: 00923 - DEB SUBQ TISSUE 20 SQ CM/< ICD-10 Diagnosis Description L97.518 Non-pressure chronic ulcer of other part of right foot with other specified sev Modifier: erity Quantity: 1 Physician Procedures : CPT4 Code Description Modifier [id number] - WC PHYS SUBQ TISS 20 SQ CM ICD-10 Diagnosis Description L97.518 Non-pressure chronic ulcer of other part of right foot with other specified severity Quantity: 1 Electronic Signature(s) Signed: 08/12/2020 5:12:45 PM By: Cory Ham MD Entered By: [name] on 08/11/2020 12:09:27

## 2020-08-14 ENCOUNTER — Telehealth: Payer: Self-pay | Admitting: Orthopedic Surgery

## 2020-08-14 ENCOUNTER — Other Ambulatory Visit: Payer: Self-pay | Admitting: Family

## 2020-08-14 ENCOUNTER — Telehealth: Payer: Self-pay | Admitting: Physician Assistant

## 2020-08-14 ENCOUNTER — Other Ambulatory Visit: Payer: Self-pay | Admitting: Physician Assistant

## 2020-08-14 MED ORDER — OXYCODONE-ACETAMINOPHEN 5-325 MG PO TABS
1.0000 | ORAL_TABLET | Freq: Four times a day (QID) | ORAL | 0 refills | Status: DC | PRN
Start: 2020-08-14 — End: 2020-10-09

## 2020-08-14 NOTE — Telephone Encounter (Signed)
Refilled the percocet

## 2020-08-14 NOTE — Telephone Encounter (Signed)
[name] from [facility] called requesting a call back concerning 2 orders faxed on 07/09/20 and 07/29/20. [name] needs a call back with the status of those orders. [name] phone number is [phone]

## 2020-08-14 NOTE — Telephone Encounter (Signed)
Patient called.   He said the refill he was supposed to get after his appointment yesterday was never called in   Call back: [phone]

## 2020-08-14 NOTE — Telephone Encounter (Signed)
Pt in office yesterday follow up transmet amputation. Pt called and states that rx that was discussed yesterday was not sent ot pharm. Can you please do this?

## 2020-08-15 ENCOUNTER — Telehealth: Payer: Self-pay | Admitting: Acute Care

## 2020-08-15 DIAGNOSIS — L97526 Non-pressure chronic ulcer of other part of left foot with bone involvement without evidence of necrosis: Secondary | ICD-10-CM | POA: Diagnosis not present

## 2020-08-15 DIAGNOSIS — J449 Chronic obstructive pulmonary disease, unspecified: Secondary | ICD-10-CM | POA: Diagnosis not present

## 2020-08-15 DIAGNOSIS — L97512 Non-pressure chronic ulcer of other part of right foot with fat layer exposed: Secondary | ICD-10-CM | POA: Diagnosis not present

## 2020-08-15 DIAGNOSIS — G9009 Other idiopathic peripheral autonomic neuropathy: Secondary | ICD-10-CM | POA: Diagnosis not present

## 2020-08-15 DIAGNOSIS — I129 Hypertensive chronic kidney disease with stage 1 through stage 4 chronic kidney disease, or unspecified chronic kidney disease: Secondary | ICD-10-CM | POA: Diagnosis not present

## 2020-08-15 DIAGNOSIS — T8781 Dehiscence of amputation stump: Secondary | ICD-10-CM | POA: Diagnosis not present

## 2020-08-15 DIAGNOSIS — N189 Chronic kidney disease, unspecified: Secondary | ICD-10-CM | POA: Diagnosis not present

## 2020-08-15 DIAGNOSIS — T8744 Infection of amputation stump, left lower extremity: Secondary | ICD-10-CM | POA: Diagnosis not present

## 2020-08-15 DIAGNOSIS — M86672 Other chronic osteomyelitis, left ankle and foot: Secondary | ICD-10-CM | POA: Diagnosis not present

## 2020-08-15 NOTE — Telephone Encounter (Signed)
It waits to get labeled, then sorted, then sent to the scan center. IC, [name] is only missing one order. She is emailing to me.

## 2020-08-15 NOTE — Telephone Encounter (Signed)
I have spoken with [name] and his LCS CT has been scheduled on 08/18/2020 @ 11:00am at [place] [facility] and he is aware

## 2020-08-15 NOTE — Telephone Encounter (Signed)
Once things have been signed and faxed where do they go? Are they scanned into chart? I do not see this under the media tab

## 2020-08-18 ENCOUNTER — Ambulatory Visit (INDEPENDENT_AMBULATORY_CARE_PROVIDER_SITE_OTHER)
Admission: RE | Admit: 2020-08-18 | Discharge: 2020-08-18 | Disposition: A | Discharge status: Home / Self Care | Payer: Medicare PPO | Source: Ambulatory Visit | Attending: Acute Care | Admitting: Acute Care

## 2020-08-18 ENCOUNTER — Other Ambulatory Visit: Payer: Self-pay

## 2020-08-18 DIAGNOSIS — Z122 Encounter for screening for malignant neoplasm of respiratory organs: Secondary | ICD-10-CM

## 2020-08-18 DIAGNOSIS — Z87891 Personal history of nicotine dependence: Secondary | ICD-10-CM | POA: Diagnosis not present

## 2020-08-18 DIAGNOSIS — F17210 Nicotine dependence, cigarettes, uncomplicated: Secondary | ICD-10-CM

## 2020-08-19 DIAGNOSIS — N189 Chronic kidney disease, unspecified: Secondary | ICD-10-CM | POA: Diagnosis not present

## 2020-08-19 DIAGNOSIS — J449 Chronic obstructive pulmonary disease, unspecified: Secondary | ICD-10-CM | POA: Diagnosis not present

## 2020-08-19 DIAGNOSIS — M86672 Other chronic osteomyelitis, left ankle and foot: Secondary | ICD-10-CM | POA: Diagnosis not present

## 2020-08-19 DIAGNOSIS — L97512 Non-pressure chronic ulcer of other part of right foot with fat layer exposed: Secondary | ICD-10-CM | POA: Diagnosis not present

## 2020-08-19 DIAGNOSIS — G9009 Other idiopathic peripheral autonomic neuropathy: Secondary | ICD-10-CM | POA: Diagnosis not present

## 2020-08-19 DIAGNOSIS — T8744 Infection of amputation stump, left lower extremity: Secondary | ICD-10-CM | POA: Diagnosis not present

## 2020-08-19 DIAGNOSIS — L97526 Non-pressure chronic ulcer of other part of left foot with bone involvement without evidence of necrosis: Secondary | ICD-10-CM | POA: Diagnosis not present

## 2020-08-19 DIAGNOSIS — T8781 Dehiscence of amputation stump: Secondary | ICD-10-CM | POA: Diagnosis not present

## 2020-08-19 DIAGNOSIS — I129 Hypertensive chronic kidney disease with stage 1 through stage 4 chronic kidney disease, or unspecified chronic kidney disease: Secondary | ICD-10-CM | POA: Diagnosis not present

## 2020-08-21 ENCOUNTER — Telehealth: Payer: Self-pay | Admitting: Orthopedic Surgery

## 2020-08-21 DIAGNOSIS — T8781 Dehiscence of amputation stump: Secondary | ICD-10-CM | POA: Diagnosis not present

## 2020-08-21 DIAGNOSIS — T8744 Infection of amputation stump, left lower extremity: Secondary | ICD-10-CM | POA: Diagnosis not present

## 2020-08-21 DIAGNOSIS — I129 Hypertensive chronic kidney disease with stage 1 through stage 4 chronic kidney disease, or unspecified chronic kidney disease: Secondary | ICD-10-CM | POA: Diagnosis not present

## 2020-08-21 DIAGNOSIS — J449 Chronic obstructive pulmonary disease, unspecified: Secondary | ICD-10-CM | POA: Diagnosis not present

## 2020-08-21 DIAGNOSIS — L97512 Non-pressure chronic ulcer of other part of right foot with fat layer exposed: Secondary | ICD-10-CM | POA: Diagnosis not present

## 2020-08-21 DIAGNOSIS — M86672 Other chronic osteomyelitis, left ankle and foot: Secondary | ICD-10-CM | POA: Diagnosis not present

## 2020-08-21 DIAGNOSIS — G9009 Other idiopathic peripheral autonomic neuropathy: Secondary | ICD-10-CM | POA: Diagnosis not present

## 2020-08-21 DIAGNOSIS — L97526 Non-pressure chronic ulcer of other part of left foot with bone involvement without evidence of necrosis: Secondary | ICD-10-CM | POA: Diagnosis not present

## 2020-08-21 DIAGNOSIS — N189 Chronic kidney disease, unspecified: Secondary | ICD-10-CM | POA: Diagnosis not present

## 2020-08-21 NOTE — Telephone Encounter (Signed)
[name] with [facility] called wanting to verify the orders for the dry wrapping; she would like a CB to let her know what exactly she's supposed to be doing when wrapping the pt. [name] states the pt mentioned using collagen   [phone]

## 2020-08-21 NOTE — Progress Notes (Signed)
Please call patient and let them  know their  low dose Ct was read as a Lung RADS 2: nodules that are benign in appearance and behavior with a very low likelihood of becoming a clinically active cancer due to size or lack of growth. Recommendation per radiology is for a repeat LDCT in 12 months. .Please let them  know we will order and schedule their  annual screening scan for 07/2021. Please let them  know there was notation of CAD on their  scan.  Please remind the patient  that this is a non-gated exam therefore degree or severity of disease  cannot be determined. Please have them  follow up with their PCP regarding potential risk factor modification, dietary therapy or pharmacologic therapy if clinically indicated. Pt.  is currently on statin therapy. Please place order for annual  screening scan for  07/2021 and fax results to PCP. Thanks so much.  Dr. Ardeth Perfect, please note cardiac findings on CT ( Three-vessel coronary atherosclerosis. Atherosclerotic nonaneurysmal thoracic aorta. Top-normal caliber main pulmonary artery (3.0 cm diameter).I just wanted to make sure you were aware.   Langley Gauss, this patient does have 3 vessel CAD . He is on Statin therapy, but I do not see cardiology notes in Epic. He may be followed by Cards through another group. Just let him know he may want to follow up  through his PCP, Dr. Ardeth Perfect who I have copied on this message. Thanks so much

## 2020-08-21 NOTE — Telephone Encounter (Signed)
Called and lm on vm to advise that we had applied silvercell to a small open are of the transmet site and gave the pt some to use at home. He can use silvercell or Aquacel the are and 4x4, Kerlix and ace and this can be changed 3 x a week. To call with any questions.

## 2020-08-22 ENCOUNTER — Other Ambulatory Visit: Payer: Self-pay | Admitting: *Deleted

## 2020-08-22 DIAGNOSIS — Z87891 Personal history of nicotine dependence: Secondary | ICD-10-CM

## 2020-08-22 DIAGNOSIS — F17210 Nicotine dependence, cigarettes, uncomplicated: Secondary | ICD-10-CM

## 2020-08-25 DIAGNOSIS — G9009 Other idiopathic peripheral autonomic neuropathy: Secondary | ICD-10-CM | POA: Diagnosis not present

## 2020-08-25 DIAGNOSIS — I129 Hypertensive chronic kidney disease with stage 1 through stage 4 chronic kidney disease, or unspecified chronic kidney disease: Secondary | ICD-10-CM | POA: Diagnosis not present

## 2020-08-25 DIAGNOSIS — T8744 Infection of amputation stump, left lower extremity: Secondary | ICD-10-CM | POA: Diagnosis not present

## 2020-08-25 DIAGNOSIS — L97526 Non-pressure chronic ulcer of other part of left foot with bone involvement without evidence of necrosis: Secondary | ICD-10-CM | POA: Diagnosis not present

## 2020-08-25 DIAGNOSIS — M86672 Other chronic osteomyelitis, left ankle and foot: Secondary | ICD-10-CM | POA: Diagnosis not present

## 2020-08-25 DIAGNOSIS — N189 Chronic kidney disease, unspecified: Secondary | ICD-10-CM | POA: Diagnosis not present

## 2020-08-25 DIAGNOSIS — J449 Chronic obstructive pulmonary disease, unspecified: Secondary | ICD-10-CM | POA: Diagnosis not present

## 2020-08-25 DIAGNOSIS — T8781 Dehiscence of amputation stump: Secondary | ICD-10-CM | POA: Diagnosis not present

## 2020-08-25 DIAGNOSIS — L97512 Non-pressure chronic ulcer of other part of right foot with fat layer exposed: Secondary | ICD-10-CM | POA: Diagnosis not present

## 2020-08-29 DIAGNOSIS — M86672 Other chronic osteomyelitis, left ankle and foot: Secondary | ICD-10-CM | POA: Diagnosis not present

## 2020-08-29 DIAGNOSIS — T8781 Dehiscence of amputation stump: Secondary | ICD-10-CM | POA: Diagnosis not present

## 2020-08-29 DIAGNOSIS — T8744 Infection of amputation stump, left lower extremity: Secondary | ICD-10-CM | POA: Diagnosis not present

## 2020-08-29 DIAGNOSIS — L97512 Non-pressure chronic ulcer of other part of right foot with fat layer exposed: Secondary | ICD-10-CM | POA: Diagnosis not present

## 2020-08-29 DIAGNOSIS — J449 Chronic obstructive pulmonary disease, unspecified: Secondary | ICD-10-CM | POA: Diagnosis not present

## 2020-08-29 DIAGNOSIS — N189 Chronic kidney disease, unspecified: Secondary | ICD-10-CM | POA: Diagnosis not present

## 2020-08-29 DIAGNOSIS — I129 Hypertensive chronic kidney disease with stage 1 through stage 4 chronic kidney disease, or unspecified chronic kidney disease: Secondary | ICD-10-CM | POA: Diagnosis not present

## 2020-08-29 DIAGNOSIS — G9009 Other idiopathic peripheral autonomic neuropathy: Secondary | ICD-10-CM | POA: Diagnosis not present

## 2020-08-29 DIAGNOSIS — L97526 Non-pressure chronic ulcer of other part of left foot with bone involvement without evidence of necrosis: Secondary | ICD-10-CM | POA: Diagnosis not present

## 2020-09-01 ENCOUNTER — Encounter (HOSPITAL_BASED_OUTPATIENT_CLINIC_OR_DEPARTMENT_OTHER): Payer: Medicare PPO | Attending: Internal Medicine | Admitting: Internal Medicine

## 2020-09-01 ENCOUNTER — Other Ambulatory Visit: Payer: Self-pay

## 2020-09-01 DIAGNOSIS — J449 Chronic obstructive pulmonary disease, unspecified: Secondary | ICD-10-CM | POA: Diagnosis not present

## 2020-09-01 DIAGNOSIS — T8744 Infection of amputation stump, left lower extremity: Secondary | ICD-10-CM | POA: Diagnosis not present

## 2020-09-01 DIAGNOSIS — I872 Venous insufficiency (chronic) (peripheral): Secondary | ICD-10-CM | POA: Diagnosis not present

## 2020-09-01 DIAGNOSIS — L97512 Non-pressure chronic ulcer of other part of right foot with fat layer exposed: Secondary | ICD-10-CM | POA: Diagnosis not present

## 2020-09-01 DIAGNOSIS — I10 Essential (primary) hypertension: Secondary | ICD-10-CM | POA: Insufficient documentation

## 2020-09-01 DIAGNOSIS — L280 Lichen simplex chronicus: Secondary | ICD-10-CM | POA: Insufficient documentation

## 2020-09-01 DIAGNOSIS — M1990 Unspecified osteoarthritis, unspecified site: Secondary | ICD-10-CM | POA: Insufficient documentation

## 2020-09-01 DIAGNOSIS — Z89411 Acquired absence of right great toe: Secondary | ICD-10-CM | POA: Diagnosis not present

## 2020-09-01 DIAGNOSIS — L97526 Non-pressure chronic ulcer of other part of left foot with bone involvement without evidence of necrosis: Secondary | ICD-10-CM | POA: Diagnosis not present

## 2020-09-01 DIAGNOSIS — L97518 Non-pressure chronic ulcer of other part of right foot with other specified severity: Secondary | ICD-10-CM | POA: Insufficient documentation

## 2020-09-01 DIAGNOSIS — T8781 Dehiscence of amputation stump: Secondary | ICD-10-CM | POA: Diagnosis not present

## 2020-09-01 DIAGNOSIS — G9009 Other idiopathic peripheral autonomic neuropathy: Secondary | ICD-10-CM | POA: Diagnosis not present

## 2020-09-01 DIAGNOSIS — M86672 Other chronic osteomyelitis, left ankle and foot: Secondary | ICD-10-CM | POA: Diagnosis not present

## 2020-09-01 DIAGNOSIS — G629 Polyneuropathy, unspecified: Secondary | ICD-10-CM | POA: Diagnosis not present

## 2020-09-01 DIAGNOSIS — I129 Hypertensive chronic kidney disease with stage 1 through stage 4 chronic kidney disease, or unspecified chronic kidney disease: Secondary | ICD-10-CM | POA: Diagnosis not present

## 2020-09-01 DIAGNOSIS — N189 Chronic kidney disease, unspecified: Secondary | ICD-10-CM | POA: Diagnosis not present

## 2020-09-01 NOTE — Progress Notes (Signed)
AHARON, CARRIERE ([id number]) Visit Report for 09/01/2020 Arrival Information Details Patient Name: Date of Service: [name], [name] 09/01/2020 10:15 A M Medical Record Number: [id number] Patient Account Number: [id number] Date of Birth/Sex: Treating RN: 22-Apr-1956 (64 y.o. Judie Petit) Yevonne Pax Primary Care Joylyn Duggin: Alysia Penna Other Clinician: Referring Kassy Mcenroe: Treating Carlesha Seiple/Extender: Jerl Mina in Treatment: 10 Visit Information History Since Last Visit All ordered tests and consults were completed: No Patient Arrived: Crutches Added or deleted any medications: No Arrival Time: 10:07 Any new allergies or adverse reactions: No Accompanied By: self Had a fall or experienced change in No Transfer Assistance: None activities of daily living that may affect Patient Identification Verified: Yes risk of falls: Secondary Verification Process Completed: Yes Signs or symptoms of abuse/neglect since last visito No Patient Requires Transmission-Based Precautions: No Hospitalized since last visit: No Patient Has Alerts: No Implantable device outside of the clinic excluding No cellular tissue based products placed in the center since last visit: Has Dressing in Place as Prescribed: Yes Pain Present Now: No Electronic Signature(s) Signed: 09/01/2020 5:20:03 PM By: Yevonne Pax RN Entered By: Yevonne Pax on 09/01/2020 10:08:15 -------------------------------------------------------------------------------- Encounter Discharge Information Details Patient Name: Date of Service: [name], [name] 09/01/2020 10:15 A M Medical Record Number: [id number] Patient Account Number: [id number] Date of Birth/Sex: Treating RN: [birth year] (64 y.o. Katherina Right Primary Care Earlie Schank: Alysia Penna Other Clinician: Referring Laurel Harnden: Treating Jerauld Bostwick/Extender: Jerl Mina in Treatment: 10 Encounter Discharge Information Items  Post Procedure Vitals Discharge Condition: Stable Temperature (F): 98.2 Ambulatory Status: Crutches Pulse (bpm): 93 Discharge Destination: Home Respiratory Rate (breaths/min): 20 Transportation: Private Auto Blood Pressure (mmHg): 168/92 Accompanied By: self Schedule Follow-up Appointment: Yes Clinical Summary of Care: Patient Declined Electronic Signature(s) Signed: 09/01/2020 5:08:20 PM By: Cherylin Mylar Entered By: Cherylin Mylar on 09/01/2020 11:20:25 -------------------------------------------------------------------------------- Lower Extremity Assessment Details Patient Name: Date of Service: [name], [name] 09/01/2020 10:15 A M Medical Record Number: [id number] Patient Account Number: [id number] Date of Birth/Sex: Treating RN: [birth year] (64 y.o. Judie Petit) Yevonne Pax Primary Care Lawyer Washabaugh: Alysia Penna Other Clinician: Referring Yacob Wilkerson: Treating Mang Hazelrigg/Extender: Jerl Mina in Treatment: 10 Edema Assessment Assessed: [Left: No] [Right: No] Edema: [Left: Ye] [Right: s] Calf Left: Right: Point of Measurement: From Medial Instep 44 cm Ankle Left: Right: Point of Measurement: From Medial Instep 25 cm Electronic Signature(s) Signed: 09/01/2020 5:20:03 PM By: Yevonne Pax RN Entered By: Yevonne Pax on 09/01/2020 10:13:58 -------------------------------------------------------------------------------- Multi Wound Chart Details Patient Name: Date of Service: [name], [name] 09/01/2020 10:15 A M Medical Record Number: [id number] Patient Account Number: [id number] Date of Birth/Sex: Treating RN: 1956-09-28 (64 y.o. Damaris Schooner Primary Care Janitza Revuelta: Alysia Penna Other Clinician: Referring Kailin Leu: Treating Crystian Frith/Extender: Jerl Mina in Treatment: 10 Vital Signs Height(in): 80 Pulse(bpm): 93 Weight(lbs): 350 Blood Pressure(mmHg): 168/92 Body Mass Index(BMI): 38 Temperature(F):  98.2 Respiratory Rate(breaths/min): 20 Photos: [12:No Photos Right Metatarsal head first] [N/A:N/A N/A] Wound Location: [12:Gradually Appeared] [N/A:N/A] Wounding Event: [12:Neuropathic Ulcer-Non Diabetic] [N/A:N/A] Primary Etiology: [12:Anemia, Chronic Obstructive] [N/A:N/A] Comorbid History: [12:Pulmonary Disease (COPD), Hypertension, Osteoarthritis, Osteomyelitis, Neuropathy 11/29/2018] [N/A:N/A] Date Acquired: [12:10] [N/A:N/A] Weeks of Treatment: [12:Open] [N/A:N/A] Wound Status: [12:2.5x1.7x0.8] [N/A:N/A] Measurements L x W x D (cm) [12:3.338] [N/A:N/A] A (cm) : rea [12:2.67] [N/A:N/A] Volume (cm) : [12:64.20%] [N/A:N/A] % Reduction in Area: [12:-43.10%] [N/A:N/A] % Reduction in Volume: [12:Full Thickness Without Exposed] [N/A:N/A] Classification: [12:Support Structures Medium] [N/A:N/A] Exudate  Amount: [12:Serosanguineous] [N/A:N/A] Exudate Type: [12:red, brown] [N/A:N/A] Exudate Color: [12:Thickened] [N/A:N/A] Wound Margin: [12:Large (67-100%)] [N/A:N/A] Granulation Amount: [12:Red] [N/A:N/A] Granulation Quality: [12:None Present (0%)] [N/A:N/A] Necrotic Amount: [12:Fat Layer (Subcutaneous Tissue): Yes N/A] Exposed Structures: [12:Fascia: No Tendon: No Muscle: No Joint: No Bone: No Small (1-33%)] [N/A:N/A] Epithelialization: [12:Debridement - Excisional] [N/A:N/A] Debridement: Pre-procedure Verification/Time Out 10:55 [N/A:N/A] Taken: [12:Callus, Subcutaneous, Slough] [N/A:N/A] Tissue Debrided: [12:Skin/Subcutaneous Tissue] [N/A:N/A] Level: [12:4.25] [N/A:N/A] Debridement A (sq cm): [12:rea Curette] [N/A:N/A] Instrument: [12:Minimum] [N/A:N/A] Bleeding: [12:Silver Nitrate] [N/A:N/A] Hemostasis A chieved: [12:0] [N/A:N/A] Procedural Pain: [12:0] [N/A:N/A] Post Procedural Pain: [12:Procedure was tolerated well] [N/A:N/A] Debridement Treatment Response: [12:2.5x1.7x0.8] [N/A:N/A] Post Debridement Measurements L x W x D (cm) [12:2.67] [N/A:N/A] Post Debridement Volume:  (cm) [12:Debridement] [N/A:N/A] Treatment Notes Wound #12 (Right Metatarsal head first) 1. Cleanse With Wound Cleanser 2. Periwound Care Moisturizing lotion 3. Primary Dressing Applied Polymem Ag 4. Secondary Dressing Dry Gauze Roll Gauze 5. Secured With Tape 7. Footwear/Offloading device applied Felt/Foam Notes netting Electronic Signature(s) Signed: 09/01/2020 4:54:01 PM By: [name] MD Signed: 09/01/2020 5:03:51 PM By: [name] RN, BSN Entered By: [name] on 09/01/2020 11:23:13 -------------------------------------------------------------------------------- Multi-Disciplinary Care Plan Details Patient Name: Date of Service: [name], [name] 09/01/2020 10:15 A M Medical Record Number: [id number] Patient Account Number: [id number] Date of Birth/Sex: Treating RN: 1956/02/09 (64 y.o. [name] Primary Care Markitta Ausburn: [name] Other Clinician: Referring Kellina Dreese: Treating Abdalrahman Clementson/Extender: [name] in Treatment: 10 Active Inactive Wound/Skin Impairment Nursing Diagnoses: Impaired tissue integrity Knowledge deficit related to ulceration/compromised skin integrity Goals: Patient/caregiver will verbalize understanding of skin care regimen Date Initiated: 06/17/2020 Target Resolution Date: 09/19/2020 Goal Status: Active Ulcer/skin breakdown will have a volume reduction of 30% by week 4 Date Initiated: 06/17/2020 Date Inactivated: 07/21/2020 Target Resolution Date: 07/18/2020 Goal Status: Met Interventions: Assess patient/caregiver ability to obtain necessary supplies Assess patient/caregiver ability to perform ulcer/skin care regimen upon admission and as needed Assess ulceration(s) every visit Provide education on ulcer and skin care Notes: Electronic Signature(s) Signed: 09/01/2020 5:03:51 PM By: [name] RN, BSN Entered By: [name] on 09/01/2020  10:58:54 -------------------------------------------------------------------------------- Pain Assessment Details Patient Name: Date of Service: [name], [name] 09/01/2020 10:15 A M Medical Record Number: [id number] Patient Account Number: [id number] Date of Birth/Sex: Treating RN: [birth year] (64 y.o. [name]) [name] Primary Care Montrice Montuori: [name] Other Clinician: Referring Gwendoline Judy: Treating Lucia Harm/Extender: [name] in Treatment: 10 Active Problems Location of Pain Severity and Description of Pain Patient Has Paino No Site Locations Pain Management and Medication Current Pain Management: Electronic Signature(s) Signed: 09/01/2020 5:20:03 PM By: [name] RN Entered By: [name] on 09/01/2020 10:08:48 -------------------------------------------------------------------------------- Patient/Caregiver Education Details Patient Name: Date of Service: [name], [name] 10/4/2021andnbsp10:15 A M Medical Record Number: [id number] Patient Account Number: [id number] Date of Birth/Gender: Treating RN: [birth year] (64 y.o. [name] Primary Care Physician: [name] Other Clinician: Referring Physician: Treating Physician/Extender: [name] in Treatment: 10 Education Assessment Education Provided To: Patient Education Topics Provided Offloading: Methods: Explain/Verbal Responses: Reinforcements needed, State content correctly Wound/Skin Impairment: Methods: Explain/Verbal Responses: Reinforcements needed, State content correctly Electronic Signature(s) Signed: 09/01/2020 5:03:51 PM By: [name] RN, BSN Entered By: [name] on 09/01/2020 10:59:50 -------------------------------------------------------------------------------- Wound Assessment Details Patient Name: Date of Service: [name], [name] 09/01/2020 10:15 A M Medical Record Number: [id number] Patient  Account Number: [id number] Date of Birth/Sex: Treating RN: July 10, 1956 (64  y.o. [name] Primary Care Yeray Tomas: [name] Other Clinician: Referring Briah Nary: Treating Neveen Daponte/Extender: [name] in Treatment: 10 Wound Status Wound Number: 12 Primary Neuropathic Ulcer-Non Diabetic Etiology: Wound Location: Right Metatarsal head first Wound Open Wounding Event: Gradually Appeared Status: Date Acquired: 11/29/2018 Comorbid Anemia, Chronic Obstructive Pulmonary Disease (COPD), Weeks Of Treatment: 10 History: Hypertension, Osteoarthritis, Osteomyelitis, Neuropathy Clustered Wound: No Wound Measurements Length: (cm) 2.5 Width: (cm) 1.7 Depth: (cm) 0.8 Area: (cm) 3.338 Volume: (cm) 2.67 % Reduction in Area: 64.2% % Reduction in Volume: -43.1% Epithelialization: Small (1-33%) Tunneling: No Undermining: No Wound Description Classification: Full Thickness Without Exposed Support Structures Wound Margin: Thickened Exudate Amount: Medium Exudate Type: Serosanguineous Exudate Color: red, brown Foul Odor After Cleansing: No Slough/Fibrino No Wound Bed Granulation Amount: Large (67-100%) Exposed Structure Granulation Quality: Red Fascia Exposed: No Necrotic Amount: None Present (0%) Fat Layer (Subcutaneous Tissue) Exposed: Yes Tendon Exposed: No Muscle Exposed: No Joint Exposed: No Bone Exposed: No Treatment Notes Wound #12 (Right Metatarsal head first) 1. Cleanse With Wound Cleanser 2. Periwound Care Moisturizing lotion 3. Primary Dressing Applied Polymem Ag 4. Secondary Dressing Dry Gauze Roll Gauze 5. Secured With Tape 7. Footwear/Offloading device applied Felt/Foam Notes netting Electronic Signature(s) Signed: 09/01/2020 5:20:03 PM By: [name] RN Entered By: [name] on 09/01/2020 10:14:33 -------------------------------------------------------------------------------- Vitals Details Patient Name: Date of  Service: [name], [name] 09/01/2020 10:15 A M Medical Record Number: [id number] Patient Account Number: [id number] Date of Birth/Sex: Treating RN: 12/17/1955 (64 y.o. [name]) [name] Primary Care Medhansh Brinkmeier: [name] Other Clinician: Referring Lowella Kindley: Treating Marnie Fazzino/Extender: [name] in Treatment: 10 Vital Signs Time Taken: 10:08 Temperature (F): 98.2 Height (in): 80 Pulse (bpm): 93 Weight (lbs): 350 Respiratory Rate (breaths/min): 20 Body Mass Index (BMI): 38.4 Blood Pressure (mmHg): 168/92 Reference Range: 80 - 120 mg / dl Electronic Signature(s) Signed: 09/01/2020 5:20:03 PM By: [name] RN Entered By: [name] on 09/01/2020 10:08:40

## 2020-09-01 NOTE — Progress Notes (Signed)
[name], BAMBER ([id number]) Visit Report for 09/01/2020 Debridement Details Patient Name: Date of Service: [name], [name] 09/01/2020 10:15 A M Medical Record Number: [id number] Patient Account Number: [id number] Date of Birth/Sex: Treating RN: 1956/05/01 (64 y.o. [name] Primary Care Provider: [name] Other Clinician: Referring Provider: Treating Provider/Extender: [name] in Treatment: 10 Debridement Performed for Assessment: Wound #12 Right Metatarsal head first Performed By: Physician [name]., MD Debridement Type: Debridement Level of Consciousness (Pre-procedure): Awake and Alert Pre-procedure Verification/Time Out Yes - 10:55 Taken: Start Time: 10:57 T Area Debrided (L x W): otal 2.5 (cm) x 1.7 (cm) = 4.25 (cm) Tissue and other material debrided: Viable, Non-Viable, Callus, Slough, Subcutaneous, Skin: Epidermis, Slough Level: Skin/Subcutaneous Tissue Debridement Description: Excisional Instrument: Curette Bleeding: Minimum Hemostasis Achieved: Silver Nitrate End Time: 11:01 Procedural Pain: 0 Post Procedural Pain: 0 Response to Treatment: Procedure was tolerated well Level of Consciousness (Post- Awake and Alert procedure): Post Debridement Measurements of Total Wound Length: (cm) 2.5 Width: (cm) 1.7 Depth: (cm) 0.8 Volume: (cm) 2.67 Character of Wound/Ulcer Post Debridement: Improved Post Procedure Diagnosis Same as Pre-procedure Electronic Signature(s) Signed: 09/01/2020 4:54:01 PM By: [name] MD Signed: 09/01/2020 5:03:51 PM By: [name] RN, BSN Entered By: [name] on 09/01/2020 11:23:23 -------------------------------------------------------------------------------- HPI Details Patient Name: Date of Service: [name], [name] 09/01/2020 10:15 A M Medical Record Number: [id number] Patient Account Number: [id number] Date of Birth/Sex: Treating RN: 05/22/1956 (64 y.o. [name] Primary Care Provider: [name] Other Clinician: Referring Provider: Treating Provider/Extender: [name] in Treatment: 10 History of Present Illness Location: right first metatarsal head on the plantar aspect and the left big toe and the right fifth toe Quality: Patient reports No Pain. Severity: Patient states wound(s) are getting worse. Duration: Patient has had the wound for > 24 months prior to seeking treatment at the wound center Context: The wound would happen gradually Modifying Factors: Wound improving due to current treatment. he has been under podiatry care for over a year and a half ssociated Signs and Symptoms: Patient reports having: heaviness in both legs A HPI Description: This 64 year old gentleman who has had a long-standing neuropathy of both feet without history of diabetes mellitus has never been worked up for his neurological problem in the last 5 years or so. Most recently he has been seen by the podiatrist Dr. [name] who was been seeing them for about a year and a half and from what I understand has been treating him for bilateral ulceration on the feet, the right being in the region of the first metatarsal head plantar aspect and the left being in the area of the left big toe. He's had various antibiotics including Levaquin recently and this was then switched to Cipro and clindamycin. Due to the nature of this wound which has been there for a long while he has been recently referred to [name] for an opinion. An MRI was done in the middle of June which showed several small abscesses near the amputation site of the right great toe with draining and open wounds with surrounding cellulitis but no evidence of septic arthritis, osteomyelitis or pyomyositis. I understand at this stage he was taken to the operating room by [name] who debrided the wound and cleaned out the abscess. I understand ABIs were done  bilaterally and they were within normal limits at Dr. [name] office. The patient is a smoker and is  working on quitting smoking. 07/13/2017 -- had a lower extremity arterial duplex evaluation which showed patent bilateral lower extremity arteries without evidence of hemodynamically significant stenosis. His right ABI was 1.05 and the left was 0.95 He had a lower extremity venous duplex reflux evaluation done which showed significant venous incompetence in the bilateral common femoral, saphenofemoral junction, small saphenous vein and in the right great saphenous vein. With these findings I believe he will benefit from a consultation with the vascular surgeon regarding the endovenous ablation procedure. the patient's neurology appointment and dermatology appointment is still pending. He has had a x-ray done by his PCP and the results are not available. However he did see [name] who I understand has ordered a bilateral MRI of his feet. 07/26/2017 -- was seen by [name] on 07/20/2017. His impression was that he presented with minimal bilateral lower extremity peripheral arterial disease, bilateral lower extremity chronic venous insufficiency (C4) and radiculopathy versus neuropathy bilateral lower extremity. No arterial intervention was needed at the present time. He recommended maximal medical management at this stage for his atherosclerotic disease . He recommended compression to be continued and no venous intervention at the present time. He was offered follow-up in the Vein clinic in 3 months time. MR of the right foot done with and without contrast -- IMPRESSION:Wound on the plantar surface of the foot at approximately the level of first MTP joint with a tiny underlying soft tissue abscess. Large area of soft tissue edema subjacent to the wound is most compatible with granulation tissue. Negative for osteomyelitis about the first MTP joint. Mild edema and enhancement in the proximal  phalanx of the little toe is stable compared to the prior examination and may be due to stress change. Infection is possible but thought highly unlikely. Subcutaneous edema over the dorsum of the foot compatible with dependent change/cellulitis. the patient also has had a abdominal CT which shows a kidney mass and is going to have her MRI for this. He also has a urology opinion pending and a general surgeon's opinion regarding a colonic mass. 08/02/2017 -- his appointments with his other urology and surgical consultants is still later. He has an MRI of the left foot later this week. 08/09/2017 --MR of the left foot with and without contrast -- IMPRESSION:Large appearing skin wound on the medial aspect of the great toe without underlying abscess or osteomyelitis.First MTP osteoarthritis. Marked marrow edema in the medial sesamoid bone is likely related to osteoarthritis but could be due to sesamoiditis. 08/16/2017 -- the patient has had a surgical opinion and a urological opinion and a extensive surgery including possible colon resection, bladder surgery and kidney surgery is being planned. No date has been set for his procedure He continues to be off smoking 08/31/2017 -- he was seen by the neurologist for his multiple problems of neuropathic and radicular symptoms in both legs with a thorough workup done. MRIs were reviewed lab work was reviewed and the assessment was that he had severe neuropathy due to multifactorial reasons and a neuropathy panel of lab work was ordered and he would be reviewed back. Patient is also due for colonoscopy to be repeated before his bladder and kidney surgery. At the present time he would like [name] to continue with conservative and symptomatic wound care for both his feet. 10/05/2017 -- since I have seen him last time he has had a colonoscopy which was fairly normal except for a polyp and he has a urology appointment pending later  today. He still continues to stay off  smoking. 10/26/2017-- he returns after about 3 weeks and in the meanwhile he was seen by Dr. Kellie Simmering, who reviewed his venous studies and noted that there was very minimal enlargement of the bilateral great saphenous vein on the right and somewhat larger vein on the left but no consistent reflux throughout the great saphenous veins and there was also some right small saphenous veins which were enlarged with some reflux. However overall he thought that venous insufficiency was not significantly the cause of his ulceration and it was more of trophic ulcers and hence he recommended aggressive wound care, and at some stage he may need amputation of the left first toe and the right fifth toe. 11/09/2017 -- due to the inclement weather he has missed his urology appointment and is still awaiting the rescheduled one. Other than that he's been doing fairly well. 11/23/2017 -- he has had his cystoscopy done by his urologist and they found multiple areas of concern and he is going to be set up for a procedure sometime later in January. He is on complex care as far as his wounds go and seizures every 2-3 weeks 12/07/17 patient presents today for evaluation concerning his right great toe amputation site as well as his left great toe ulcer. He has been tolerating the dressing changes fairly well. With that being said there does not appear to be any significant discomfort at this point that he is experiencing although he has quite significant callous especially in regard to the amputation site of the right great toe. Nonetheless he has no evidence of infection which is good news No fevers, chills, nausea, or vomiting noted at this time. He has no pain. READMISSION 05/09/18; is a patient who hasn't been here in quite some time. He is not a diabetic but he has severe idiopathic peripheral neuropathy. He was here for review of a nonhealing wound on the right great toe amputation site and a large portion of the tip of the  left great toe. Sometime after his last visit here he became ill he had abdominal issues infections and required hospitalizations. He largely dropped off the map. He is putting silver alginate on the wounds. I note that he had arterial studies and saw Dr. Bridgett Larsson of vascular surgery was not felt to have an arterial issue. He also had reflux studies and he wears compression stockings and he is faithful with these. He had MRI of both of his feet that not show osteomyelitis question small abscess on the right. He tells me that the nonhealing wound on his right great toe amputation site is been there since the actual amputation which was in 2014 I note the area was aggressively debrided by Dr. Jacqualyn Posey of podiatry before he started coming to this clinic apparently there were abscess is present at that time. His last formal arterial studies were done in August 2018 which time his ABI was 1.05 on the right and 0.95 on the left he had biphasic and triphasic waveforms on the left and monophasic and biphasic waveforms on the right. He did not have TBI's. ABIs in our clinic today were 0.8 bilaterally. The patient states he is not a diabetic. He does have idiopathic peripheral neuropathy. He is a smoker but is trying to quit with Chantix. 05/16/18; x-rays of his bilateral feet were negative for osteomyelitis. He was noted to have a chronic deformity of the head of the second metatarsal which may be due to previous  osteonecrosis there was no definite underlying bone destruction to suggest osteomyelitis on either side. He came in with the extensive callus over his right great toe amputation site and the left great toe did extensive debridement last week and another extensive debridement today.patient is changing his dressing himself with silver alginate 05/30/18; still buildup of thick callus covering thick subcutaneous tissue around both of these areas. This requires an extensive debridement on both sides. I have  discussed offloading this area with the patient. I think he inverts when he walks. His foot sizes beyond what we can put in a Darco forefoot off loader or probably what we can put in a total contact cast. 06/13/18; still a remarkable buildup of callus over the right first toe amputation site. The left first toe has 2 open wounds one medially and 1 at the tip of the toe. Surrounding callus is also not back here. We've been using silver alginate. He has a darco forefoot off loader now on the right foot.his own shoe on the left foot. Would like to try a total contact cast on the right. He is making arrangements for someone to drive him here in 2 weeks. 06/29/18; still a buildup of callus over the right first toe amputation site although not as significant as previously. The left first toe has 2 open wounds one medially and one at the tip surrounding callus as well. We've been using silver alginate. He comes in today with the right for a total contact cast which I'll use on the right 07/04/18 on evaluation today patient appears to be doing decently well in regard to the cast. Unfortunately he did have a little area that rubbed on the lateral aspect of his leg the cast actually cracked on the medial aspect this happened he states just yesterday. He has not been walking in the cast without the boot portion on. With that being said he states that he is unsure of exactly what happened with the crack. Nonetheless this does seem to have caused a little bit of rubbing on the lateral aspect I think due to the integrity of the cast being compromised. Nonetheless I advised him that if this happens in the future he needs to let us know soon as possible. Nonetheless I do feel like the cast has been of benefit some of the area on the distal portion of his great toe amputation site actually appears to be doing better. 07/12/18 on evaluation today patient appears to be doing very well in regard to his right great toe  invitation site. This seems to be showing signs of excellent improvement which is great news. There does not appear to be any evidence of infection at this time. With that being said he has been tolerating the dressing changes without complication. There appears to be no evidence of infection and again this is barely open at this point. The total contact cast has been of excellent benefit for him. I believe this will likely be completely closed and ready next week that we can switch to a cast on the left foot. He states he may want to take a week's break. 07/19/18 on evaluation today patient appears to be doing better in regard to both ulceration areas. The right foot actually appears to be completely healed he still has some callous but no open wound. The left foot/great toe actually seems to be better after last week's debridement overall the appearance of the wound is greatly improved. 08/09/18 on evaluation today patient actually  appears to be doing a little bit worse in regard to the right metatarsal invitation site. He's been tolerating the dressing changes without complication. With that being said he does note that the area actually cracked open as of today when he was in the shower trying to scrub it. Nonetheless it does appear that the Caryn Section that previously was open and draining has subsequently reopened and is draining again. Nonetheless as I was looking at him and performing the debridement today I did inquire about whether or not we had ever biopsy the area as I was concerned about the possibility for something along the lines of a work type virus/infection causing the excessive callous buildup. Especially since he seems to grow much more than what would just be traditionally expected by friction alone. He subsequently told me that he did have this biopsied somewhere around 1-2 years ago by Dr. Jacqualyn Posey and that it showed that he had a wart infection at that point. Nonetheless he was never  referred to dermatology better Jacqualyn Posey attempted to cut it out according to the patient. Nonetheless that may be part of the big issue with what we're dealing with here and without treating the wart infection we may not actually get this to heal and stay closed. READMISSION 12/07/2018 The patient is back in clinic today essentially with wounds in the same area and condition is that I think the last 2 times he has been here. This includes the right first metatarsal head in the setting of a previous right great toe amputation and a large part of the medial part of his left great toe. The patient states he has been dealing with this for years dating back to 2005 on and off. That he is never found this to be totally healed. When he was last in this clinic he had this biopsied shave biopsies which showed hyperkeratotic and parakeratotic debris. Epithelium with papillary features negative for malignancy. He has been using silver alginate. He has healing sandals which he uses Felt on the bottom of these. When he was here last time we put him a total contact cast at least for a while and the patient states on the right that helped with the buildup of hyperkeratotic tissue and that seems to be reflective in our notes. He also states that was previously biopsied by Dr. Jacqualyn Posey of podiatry. The patient has a severe idiopathic peripheral neuropathy which he attributes to a brown recluse spider bite in 2002 or so. He is not a diabetic The patient had arterial studies in this showed an ABI in the right of 1.05 on the left was 0.95. TBI's were not done. He had monophasic waveforms at the right posterior tibial biphasic at the dorsalis pedis. On the left he was biphasic at the posterior tibial and triphasic at the dorsalis pedis. 1/17; this is a patient with a very challenging issue was readmitted to the clinic last week. He has a large area over the first right MTP in the setting of a previous right great toe  amputation. On presentation this is covered with copious amounts of hyperkeratotic thick callus nonviable tissue. When this is debridement both on this occasion and previous occasions in this clinic he has a very boggy presentation to the subcutaneous tissue which looks somewhat atypical. He has a similar area on the left great toe from the tip of the toe down the medial aspect beyond the interphalangeal joint. He's had previous MRIs of both of his feet. On the right  that showed small abscesses and he was taken to the OR by podiatry for debridement and cleaning out of the abscesses. An MRI did not show osteomyelitis of the left first toe. Both the MRIs were done in 2018. He saw Dr. Vallarie Mare at the time who did not think he had significant macrovascular disease. He is not a diabetic but he has idiopathic peripheral neuropathy and was a significant smoker. When he was in the clinic earlier in 2019 he had biopsies done of both areas that showed hyperkeratotic material but no malignancy. X-rays I ordered last week did not show osteomyelitis on the left but did suggest osteomyelitis at the tip of the left great toe. I looked at the x-rays myself this is a subtle finding it is only seen on the lateral film. I did a very significant debridement on him last week bilaterally in both the wound areas look better. When he was previously in our clinic the area on the right did improve with a total contact cast. He dropped out of the schedule at that point I'm not exactly sure why 1/24; the biopsy I did of the area over his right first metatarsal head showed the possibility of verruca vulgaris. Superimposed changes of lichen simplex chronicus. If this is a plantar wart this would be extremely large. I think he would need topical destructive therapy and I am going to refer him to dermatology. There was no suggestion of malignancy. Previous biopsies in this clinic on both his areas did not show evidence of malignancy  either. Until we get dermatology I am going to continue with silver alginate. X-rays suggested osteomyelitis of the left toe I am going to continue him on trimethoprim sulfamethoxazole 2/3; not much change in this area on the right first met head. Extensive dark eschar formation around the central area of nonviable subcutaneous tissue looking like a boggy subcutaneous tissue. Using a #10 scalpel and pickups I removed all of this. I have tried this before and this man but I plan to put him in a total contact cast next week Using a #5 curette debridement of the left great toe He has an appointment with dermatology at [facility] on the 25th 2/11; right first metatarsal head after last week's extensive debridement is right back to the way it was with a nonviable subcutaneous tissue and thick surrounding eschar. The left great toe looks about the same. The patient could not have a total contact cast today because he had household problems [leaking roof] but I am not going to put a cast on him until I have information from the dermatologist about this. READMISSION 06/17/2020 Mr. Schnorr is now a 64 year old man that we have had in this clinic on at least 2 occasions previously. When he was last here in February 2020 I referred him to dermatology at [facility] for their review of large wounds on the right first metatarsal head with surrounding severe chronic hyperkeratosis. This was biopsied on at least 2 occasions in our clinic and I think at [facility] as well that did not show an underlying malignancy. I have reviewed the consult from Dr. Sharlette Dense dermatology from 01/23/2019. At that time the patient had wounds on the plantar right first metatarsal head as well as the plantar left great toe. From what he is saying the left great toe healed however he recently had to have a left TMA by Dr. 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  Dermatology at [facility] apparently  referred him to Dr. Luetta Nutting of plastic surgery and an operative excision of this area was planned however apparently they ran into the pandemic. The procedure was repetitively cancel and the patient did not keep his final appointment which I think was earlier this year he was also seen in February of this year at the wound care center in [facility] by Dr. Zigmund Daniel. They were applying Silvadene cream and Xeroform which she is essentially doing now. I see in his notes that there was still some concern about underlying malignancy although would be difficult to believe as long as this is been there that there would not be more extensive damage at this point in any case he is back in our clinic for review of this. He had an x-ray of the foot on 01/23/2020 that was negative for osteomyelitis acute fracture. Past medical history; idiopathic peripheral neuropathy, right great toe amputation in 2011, lumbar spinal stenosis, left transmetatarsal amputation on 05/30/2020 by Dr. Sharol Given and more recently apparently a slight wound dehiscence. He also had a fracture of his left tibial plateau sometime in 2020. ABI in our clinic was 0.95 8/9-Patient returns after being established in the clinic last time, he has a new open area on the plantar aspect of his right second toe, the right foot plantar ulcer below the great toe on the right looks slightly smaller, using PolyMem. Patient attends to his surgeon for the left foot wound 8/23; this is a patient with a refractory wound over his right first metatarsal head in the setting of severe surrounding hyperkeratosis. I had referred him to dermatology subsequently came to the attention of Dr. Vernona Rieger and Dr. Zigmund Daniel at [facility]. He also had a left TMA by Dr. Sharol Given on the left foot because of osteomyelitis I believe. He is in bilateral surgical shoes. 9/13; refractory wound over the right first metatarsal head with surrounding hyperkeratosis Verrucous skin. He had a left transmetatarsal  amputation by Dr. Sharol Given. We have not been following this however this apparently is still not closed. I have looked over his notes from [facility]. This is well summarized by a note from Dr. Zigmund Daniel on February 24. The patient had had several biopsies of the skin of the first toe metatarsal head. As well as the left great toe which has since been amputated. The pathology results showed Stratus corneum with hyperkeratosis and parakeratosis. An underlying verricous carcinoma could not be excluded. I think the patient was given follow-up but I will actually think that he kept his appointments. He was sent back to follow-up with him with them again in February 2021. Dr. Luetta Nutting who is the plastic surgeon recommended wound care rather than extensive debridement. I am very doubtful that this represents any form of malignancy. This patient has been dealing with this wound for years and I think we would have had a declaration of status well before this if this were any form of malignancy. I also do not believe he has an arterial issue. ABI in the clinic here was 0.95. He has easily palpable peripheral pulses. 10/4; generally gradually better wound area over the right plantar foot first metatarsal head.Marland Kitchen He still has thick surrounding callus around the wound margins and a fibrinous debris over the wound surface although in general a lot of this looks considerably better. He has been following with orthopedics for a left amputation by Dr. Sharol Given. We have not been following this Electronic Signature(s) Signed: 09/01/2020 4:54:01 PM By: [name] MD  Entered By: [name] on 09/01/2020 11:25:28 -------------------------------------------------------------------------------- Physical Exam Details Patient Name: Date of Service: [name], [name] 09/01/2020 10:15 A M Medical Record Number: [id number] Patient Account Number: [id number] Date of Birth/Sex: Treating RN: March 27, 1956 (64 y.o. [name] Primary Care Provider: [name] Other Clinician: Referring Provider: Treating Provider/Extender: [name] in Treatment: 10 Constitutional Patient is hypertensive.. Pulse regular and within target range for patient.Marland Kitchen Respirations regular, non-labored and within target range.. Temperature is normal and within the target range for the patient.Marland Kitchen Appears in no distress. Notes Wound exam; the wound surfaces generally better than what I have seen in this man. Much less thick veraccused debris. Still requiring debridement with a #5 curette to see if we can get a surface edge as well as aggressive debridement of the wound surface. Hemostasis with silver nitrate and direct pressure Electronic Signature(s) Signed: 09/01/2020 4:54:01 PM By: [name] MD Entered By: [name] on 09/01/2020 11:26:31 -------------------------------------------------------------------------------- Physician Orders Details Patient Name: Date of Service: [name], [name] 09/01/2020 10:15 A M Medical Record Number: [id number] Patient Account Number: [id number] Date of Birth/Sex: Treating RN: February 02, 1956 (64 y.o. [name] Primary Care Provider: [name] Other Clinician: Referring Provider: Treating Provider/Extender: [name] in Treatment: 10 Verbal / Phone Orders: No Diagnosis Coding ICD-10 Coding Code Description L97.518 Non-pressure chronic ulcer of other part of right foot with other specified severity G90.09 Other idiopathic peripheral autonomic neuropathy Follow-up Appointments Return Appointment in 2 weeks. Dressing Change Frequency Wound #12 Right Metatarsal head first Change Dressing every other day. Skin Barriers/Peri-Wound Care Moisturizing lotion - to feet daily Wound Cleansing Wound #12 Right Metatarsal head first Clean wound with Wound Cleanser - or normal saline Primary Wound Dressing Wound #12  Right Metatarsal head first Polymem Silver Secondary Dressing Wound #12 Right Metatarsal head first Kerlix/Rolled Gauze Dry Gauze Other: - felt callous pad or foam donut Off-Loading Open toe surgical shoe to: - right foot [facility] skilled nursing for wound care. Alvis Lemmings Electronic Signature(s) Signed: 09/01/2020 4:54:01 PM By: [name] MD Signed: 09/01/2020 5:03:51 PM By: [name] RN, BSN Entered By: [name] on 09/01/2020 11:02:52 -------------------------------------------------------------------------------- Problem List Details Patient Name: Date of Service: [name], [name] 09/01/2020 10:15 A M Medical Record Number: [id number] Patient Account Number: [id number] Date of Birth/Sex: Treating RN: [birth year] (64 y.o. [name] Primary Care Provider: [name] Other Clinician: Referring Provider: Treating Provider/Extender: [name] in Treatment: 10 Active Problems ICD-10 Encounter Code Description Active Date MDM Diagnosis L97.518 Non-pressure chronic ulcer of other part of right foot with other specified 06/17/2020 No Yes severity G90.09 Other idiopathic peripheral autonomic neuropathy 06/17/2020 No Yes Inactive Problems Resolved Problems Electronic Signature(s) Signed: 09/01/2020 4:54:01 PM By: [name] MD Entered By: [name] on 09/01/2020 11:23:07 -------------------------------------------------------------------------------- Progress Note Details Patient Name: Date of Service: [name], [name] 09/01/2020 10:15 A M Medical Record Number: [id number] Patient Account Number: [id number] Date of Birth/Sex: Treating RN: May 20, 1956 (64 y.o. [name] Primary Care Provider: [name] Other Clinician: Referring Provider: Treating Provider/Extender: [name] in Treatment: 10 Subjective History of Present Illness (HPI) The  following HPI elements were documented for the patient's wound: Location: right first metatarsal head on the plantar aspect and the left big toe and the right fifth toe Quality: Patient reports No Pain. Severity: Patient states wound(s) are  getting worse. Duration: Patient has had the wound for > 24 months prior to seeking treatment at the wound center Context: The wound would happen gradually Modifying Factors: Wound improving due to current treatment. he has been under podiatry care for over a year and a half Associated Signs and Symptoms: Patient reports having: heaviness in both legs This 64 year old gentleman who has had a long-standing neuropathy of both feet without history of diabetes mellitus has never been worked up for his neurological problem in the last 5 years or so. Most recently he has been seen by the podiatrist Dr. [name] who was been seeing them for about a year and a half and from what I understand has been treating him for bilateral ulceration on the feet, the right being in the region of the first metatarsal head plantar aspect and the left being in the area of the left big toe. He's had various antibiotics including Levaquin recently and this was then switched to Cipro and clindamycin. Due to the nature of this wound which has been there for a long while he has been recently referred to [name] for an opinion. An MRI was done in the middle of June which showed several small abscesses near the amputation site of the right great toe with draining and open wounds with surrounding cellulitis but no evidence of septic arthritis, osteomyelitis or pyomyositis. I understand at this stage he was taken to the operating room by [name] who debrided the wound and cleaned out the abscess. I understand ABIs were done bilaterally and they were within normal limits at Dr. [name] office. The patient is a smoker and is working on quitting smoking. 07/13/2017 -- had a lower extremity  arterial duplex evaluation which showed patent bilateral lower extremity arteries without evidence of hemodynamically significant stenosis. His right ABI was 1.05 and the left was 0.95 He had a lower extremity venous duplex reflux evaluation done which showed significant venous incompetence in the bilateral common femoral, saphenofemoral junction, small saphenous vein and in the right great saphenous vein. With these findings I believe he will benefit from a consultation with the vascular surgeon regarding the endovenous ablation procedure. the patient's neurology appointment and dermatology appointment is still pending. He has had a x-ray done by his PCP and the results are not available. However he did see [name] who I understand has ordered a bilateral MRI of his feet. 07/26/2017 -- was seen by [name] on 07/20/2017. His impression was that he presented with minimal bilateral lower extremity peripheral arterial disease, bilateral lower extremity chronic venous insufficiency (C4) and radiculopathy versus neuropathy bilateral lower extremity. No arterial intervention was needed at the present time. He recommended maximal medical management at this stage for his atherosclerotic disease . He recommended compression to be continued and no venous intervention at the present time. He was offered follow-up in the Vein clinic in 3 months time. MR of the right foot done with and without contrast -- IMPRESSION:Wound on the plantar surface of the foot at approximately the level of first MTP joint with a tiny underlying soft tissue abscess. Large area of soft tissue edema subjacent to the wound is most compatible with granulation tissue. Negative for osteomyelitis about the first MTP joint. Mild edema and enhancement in the proximal phalanx of the little toe is stable compared to the prior examination and may be due to stress change. Infection is possible but thought highly unlikely. Subcutaneous  edema over the dorsum of the foot  compatible with dependent change/cellulitis. the patient also has had a abdominal CT which shows a kidney mass and is going to have her MRI for this. He also has a urology opinion pending and a general surgeon's opinion regarding a colonic mass. 08/02/2017 -- his appointments with his other urology and surgical consultants is still later. He has an MRI of the left foot later this week. 08/09/2017 --MR of the left foot with and without contrast -- IMPRESSION:Large appearing skin wound on the medial aspect of the great toe without underlying abscess or osteomyelitis.First MTP osteoarthritis. Marked marrow edema in the medial sesamoid bone is likely related to osteoarthritis but could be due to sesamoiditis. 08/16/2017 -- the patient has had a surgical opinion and a urological opinion and a extensive surgery including possible colon resection, bladder surgery and kidney surgery is being planned. No date has been set for his procedure He continues to be off smoking 08/31/2017 -- he was seen by the neurologist for his multiple problems of neuropathic and radicular symptoms in both legs with a thorough workup done. MRIs were reviewed lab work was reviewed and the assessment was that he had severe neuropathy due to multifactorial reasons and a neuropathy panel of lab work was ordered and he would be reviewed back. Patient is also due for colonoscopy to be repeated before his bladder and kidney surgery. At the present time he would like [name] to continue with conservative and symptomatic wound care for both his feet. 10/05/2017 -- since I have seen him last time he has had a colonoscopy which was fairly normal except for a polyp and he has a urology appointment pending later today. He still continues to stay off smoking. 10/26/2017-- he returns after about 3 weeks and in the meanwhile he was seen by Dr. Kellie Simmering, who reviewed his venous studies and noted that there was  very minimal enlargement of the bilateral great saphenous vein on the right and somewhat larger vein on the left but no consistent reflux throughout the great saphenous veins and there was also some right small saphenous veins which were enlarged with some reflux. However overall he thought that venous insufficiency was not significantly the cause of his ulceration and it was more of trophic ulcers and hence he recommended aggressive wound care, and at some stage he may need amputation of the left first toe and the right fifth toe. 11/09/2017 -- due to the inclement weather he has missed his urology appointment and is still awaiting the rescheduled one. Other than that he's been doing fairly well. 11/23/2017 -- he has had his cystoscopy done by his urologist and they found multiple areas of concern and he is going to be set up for a procedure sometime later in January. He is on complex care as far as his wounds go and seizures every 2-3 weeks 12/07/17 patient presents today for evaluation concerning his right great toe amputation site as well as his left great toe ulcer. He has been tolerating the dressing changes fairly well. With that being said there does not appear to be any significant discomfort at this point that he is experiencing although he has quite significant callous especially in regard to the amputation site of the right great toe. Nonetheless he has no evidence of infection which is good news No fevers, chills, nausea, or vomiting noted at this time. He has no pain. READMISSION 05/09/18; is a patient who hasn't been here in quite some time. He is not a diabetic but he  has severe idiopathic peripheral neuropathy. He was here for review of a nonhealing wound on the right great toe amputation site and a large portion of the tip of the left great toe. Sometime after his last visit here he became ill he had abdominal issues infections and required hospitalizations. He largely dropped off the  map. He is putting silver alginate on the wounds. I note that he had arterial studies and saw Dr. Bridgett Larsson of vascular surgery was not felt to have an arterial issue. He also had reflux studies and he wears compression stockings and he is faithful with these. He had MRI of both of his feet that not show osteomyelitis question small abscess on the right. He tells me that the nonhealing wound on his right great toe amputation site is been there since the actual amputation which was in 2014 I note the area was aggressively debrided by Dr. Jacqualyn Posey of podiatry before he started coming to this clinic apparently there were abscess is present at that time. His last formal arterial studies were done in August 2018 which time his ABI was 1.05 on the right and 0.95 on the left he had biphasic and triphasic waveforms on the left and monophasic and biphasic waveforms on the right. He did not have TBI's. ABIs in our clinic today were 0.8 bilaterally. The patient states he is not a diabetic. He does have idiopathic peripheral neuropathy. He is a smoker but is trying to quit with Chantix. 05/16/18; x-rays of his bilateral feet were negative for osteomyelitis. He was noted to have a chronic deformity of the head of the second metatarsal which may be due to previous osteonecrosis there was no definite underlying bone destruction to suggest osteomyelitis on either side. He came in with the extensive callus over his right great toe amputation site and the left great toe did extensive debridement last week and another extensive debridement today.patient is changing his dressing himself with silver alginate 05/30/18; still buildup of thick callus covering thick subcutaneous tissue around both of these areas. This requires an extensive debridement on both sides. I have discussed offloading this area with the patient. I think he inverts when he walks. His foot sizes beyond what we can put in a Darco forefoot off loader or probably  what we can put in a total contact cast. 06/13/18; still a remarkable buildup of callus over the right first toe amputation site. The left first toe has 2 open wounds one medially and 1 at the tip of the toe. Surrounding callus is also not back here. We've been using silver alginate. He has a darco forefoot off loader now on the right foot.his own shoe on the left foot. Would like to try a total contact cast on the right. He is making arrangements for someone to drive him here in 2 weeks. 06/29/18; still a buildup of callus over the right first toe amputation site although not as significant as previously. The left first toe has 2 open wounds one medially and one at the tip surrounding callus as well. We've been using silver alginate. He comes in today with the right for a total contact cast which I'll use on the right 07/04/18 on evaluation today patient appears to be doing decently well in regard to the cast. Unfortunately he did have a little area that rubbed on the lateral aspect of his leg the cast actually cracked on the medial aspect this happened he states just yesterday. He has not been walking in the  cast without the boot portion on. With that being said he states that he is unsure of exactly what happened with the crack. Nonetheless this does seem to have caused a little bit of rubbing on the lateral aspect I think due to the integrity of the cast being compromised. Nonetheless I advised him that if this happens in the future he needs to let us know soon as possible. Nonetheless I do feel like the cast has been of benefit some of the area on the distal portion of his great toe amputation site actually appears to be doing better. 07/12/18 on evaluation today patient appears to be doing very well in regard to his right great toe invitation site. This seems to be showing signs of excellent improvement which is great news. There does not appear to be any evidence of infection at this time. With that  being said he has been tolerating the dressing changes without complication. There appears to be no evidence of infection and again this is barely open at this point. The total contact cast has been of excellent benefit for him. I believe this will likely be completely closed and ready next week that we can switch to a cast on the left foot. He states he may want to take a week's break. 07/19/18 on evaluation today patient appears to be doing better in regard to both ulceration areas. The right foot actually appears to be completely healed he still has some callous but no open wound. The left foot/great toe actually seems to be better after last week's debridement overall the appearance of the wound is greatly improved. 08/09/18 on evaluation today patient actually appears to be doing a little bit worse in regard to the right metatarsal invitation site. He's been tolerating the dressing changes without complication. With that being said he does note that the area actually cracked open as of today when he was in the shower trying to scrub it. Nonetheless it does appear that the Caryn Section that previously was open and draining has subsequently reopened and is draining again. Nonetheless as I was looking at him and performing the debridement today I did inquire about whether or not we had ever biopsy the area as I was concerned about the possibility for something along the lines of a work type virus/infection causing the excessive callous buildup. Especially since he seems to grow much more than what would just be traditionally expected by friction alone. He subsequently told me that he did have this biopsied somewhere around 1-2 years ago by Dr. Jacqualyn Posey and that it showed that he had a wart infection at that point. Nonetheless he was never referred to dermatology better Jacqualyn Posey attempted to cut it out according to the patient. Nonetheless that may be part of the big issue with what we're dealing with here and  without treating the wart infection we may not actually get this to heal and stay closed. READMISSION 12/07/2018 The patient is back in clinic today essentially with wounds in the same area and condition is that I think the last 2 times he has been here. This includes the right first metatarsal head in the setting of a previous right great toe amputation and a large part of the medial part of his left great toe. The patient states he has been dealing with this for years dating back to 2005 on and off. That he is never found this to be totally healed. When he was last in this clinic he had this biopsied  shave biopsies which showed hyperkeratotic and parakeratotic debris. Epithelium with papillary features negative for malignancy. He has been using silver alginate. He has healing sandals which he uses Felt on the bottom of these. When he was here last time we put him a total contact cast at least for a while and the patient states on the right that helped with the buildup of hyperkeratotic tissue and that seems to be reflective in our notes. He also states that was previously biopsied by Dr. Jacqualyn Posey of podiatry. The patient has a severe idiopathic peripheral neuropathy which he attributes to a brown recluse spider bite in 2002 or so. He is not a diabetic The patient had arterial studies in this showed an ABI in the right of 1.05 on the left was 0.95. TBI's were not done. He had monophasic waveforms at the right posterior tibial biphasic at the dorsalis pedis. On the left he was biphasic at the posterior tibial and triphasic at the dorsalis pedis. 1/17; this is a patient with a very challenging issue was readmitted to the clinic last week. He has a large area over the first right MTP in the setting of a previous right great toe amputation. On presentation this is covered with copious amounts of hyperkeratotic thick callus nonviable tissue. When this is debridement both on this occasion and previous  occasions in this clinic he has a very boggy presentation to the subcutaneous tissue which looks somewhat atypical. He has a similar area on the left great toe from the tip of the toe down the medial aspect beyond the interphalangeal joint. He's had previous MRIs of both of his feet. On the right that showed small abscesses and he was taken to the OR by podiatry for debridement and cleaning out of the abscesses. An MRI did not show osteomyelitis of the left first toe. Both the MRIs were done in 2018. He saw Dr. Vallarie Mare at the time who did not think he had significant macrovascular disease. He is not a diabetic but he has idiopathic peripheral neuropathy and was a significant smoker. When he was in the clinic earlier in 2019 he had biopsies done of both areas that showed hyperkeratotic material but no malignancy. X-rays I ordered last week did not show osteomyelitis on the left but did suggest osteomyelitis at the tip of the left great toe. I looked at the x-rays myself this is a subtle finding it is only seen on the lateral film. I did a very significant debridement on him last week bilaterally in both the wound areas look better. When he was previously in our clinic the area on the right did improve with a total contact cast. He dropped out of the schedule at that point I'm not exactly sure why 1/24; the biopsy I did of the area over his right first metatarsal head showed the possibility of verruca vulgaris. Superimposed changes of lichen simplex chronicus. If this is a plantar wart this would be extremely large. I think he would need topical destructive therapy and I am going to refer him to dermatology. There was no suggestion of malignancy. Previous biopsies in this clinic on both his areas did not show evidence of malignancy either. Until we get dermatology I am going to continue with silver alginate. X-rays suggested osteomyelitis of the left toe I am going to continue him on trimethoprim  sulfamethoxazole 2/3; not much change in this area on the right first met head. Extensive dark eschar formation around the central area of nonviable  subcutaneous tissue looking like a boggy subcutaneous tissue. Using a #10 scalpel and pickups I removed all of this. I have tried this before and this man but I plan to put him in a total contact cast next week ooUsing a #5 curette debridement of the left great toe ooHe has an appointment with dermatology at [facility] on the 25th 2/11; right first metatarsal head after last week's extensive debridement is right back to the way it was with a nonviable subcutaneous tissue and thick surrounding eschar. The left great toe looks about the same. The patient could not have a total contact cast today because he had household problems [leaking roof] but I am not going to put a cast on him until I have information from the dermatologist about this. READMISSION 06/17/2020 Mr. Jeschke is now a 64 year old man that we have had in this clinic on at least 2 occasions previously. When he was last here in February 2020 I referred him to dermatology at [facility] for their review of large wounds on the right first metatarsal head with surrounding severe chronic hyperkeratosis. This was biopsied on at least 2 occasions in our clinic and I think at [facility] as well that did not show an underlying malignancy. I have reviewed the consult from Dr. Sharlette Dense dermatology from 01/23/2019. At that time the patient had wounds on the plantar right first metatarsal head as well as the plantar left great toe. From what he is saying the left great toe healed however he recently had to have a left TMA by Dr. 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 Dermatology at [facility] apparently referred him to Dr. Luetta Nutting of plastic surgery and an operative excision of this area was planned however apparently they ran into the pandemic. The procedure was  repetitively cancel and the patient did not keep his final appointment which I think was earlier this year he was also seen in February of this year at the wound care center in [facility] by Dr. Zigmund Daniel. They were applying Silvadene cream and Xeroform which she is essentially doing now. I see in his notes that there was still some concern about underlying malignancy although would be difficult to believe as long as this is been there that there would not be more extensive damage at this point in any case he is back in our clinic for review of this. He had an x-ray of the foot on 01/23/2020 that was negative for osteomyelitis acute fracture. Past medical history; idiopathic peripheral neuropathy, right great toe amputation in 2011, lumbar spinal stenosis, left transmetatarsal amputation on 05/30/2020 by Dr. Sharol Given and more recently apparently a slight wound dehiscence. He also had a fracture of his left tibial plateau sometime in 2020. ABI in our clinic was 0.95 8/9-Patient returns after being established in the clinic last time, he has a new open area on the plantar aspect of his right second toe, the right foot plantar ulcer below the great toe on the right looks slightly smaller, using PolyMem. Patient attends to his surgeon for the left foot wound 8/23; this is a patient with a refractory wound over his right first metatarsal head in the setting of severe surrounding hyperkeratosis. I had referred him to dermatology subsequently came to the attention of Dr. Vernona Rieger and Dr. Zigmund Daniel at [facility]. He also had a left TMA by Dr. Sharol Given on the left foot because of osteomyelitis I believe. He is in bilateral surgical shoes.  9/13; refractory wound over the right first metatarsal head with surrounding hyperkeratosis Verrucous skin. He had a left transmetatarsal amputation by Dr. Sharol Given. We have not been following this however this apparently is still not closed. I have looked over his notes from [facility]. This is well summarized  by a note from Dr. Zigmund Daniel on February 24. The patient had had several biopsies of the skin of the first toe metatarsal head. As well as the left great toe which has since been amputated. The pathology results showed Stratus corneum with hyperkeratosis and parakeratosis. An underlying verricous carcinoma could not be excluded. I think the patient was given follow-up but I will actually think that he kept his appointments. He was sent back to follow-up with him with them again in February 2021. Dr. Luetta Nutting who is the plastic surgeon recommended wound care rather than extensive debridement. I am very doubtful that this represents any form of malignancy. This patient has been dealing with this wound for years and I think we would have had a declaration of status well before this if this were any form of malignancy. I also do not believe he has an arterial issue. ABI in the clinic here was 0.95. He has easily palpable peripheral pulses. 10/4; generally gradually better wound area over the right plantar foot first metatarsal head.Marland Kitchen He still has thick surrounding callus around the wound margins and a fibrinous debris over the wound surface although in general a lot of this looks considerably better. He has been following with orthopedics for a left amputation by Dr. Sharol Given. We have not been following this Objective Constitutional Patient is hypertensive.. Pulse regular and within target range for patient.Marland Kitchen Respirations regular, non-labored and within target range.. Temperature is normal and within the target range for the patient.Marland Kitchen Appears in no distress. Vitals Time Taken: 10:08 AM, Height: 80 in, Weight: 350 lbs, BMI: 38.4, Temperature: 98.2 F, Pulse: 93 bpm, Respiratory Rate: 20 breaths/min, Blood Pressure: 168/92 mmHg. General Notes: Wound exam; the wound surfaces generally better than what I have seen in this man. Much less thick veraccused debris. Still requiring debridement with a #5 curette to see if  we can get a surface edge as well as aggressive debridement of the wound surface. Hemostasis with silver nitrate and direct pressure Integumentary (Hair, Skin) Wound #12 status is Open. Original cause of wound was Gradually Appeared. The wound is located on the Right Metatarsal head first. The wound measures 2.5cm length x 1.7cm width x 0.8cm depth; 3.338cm^2 area and 2.67cm^3 volume. There is Fat Layer (Subcutaneous Tissue) exposed. There is no tunneling or undermining noted. There is a medium amount of serosanguineous drainage noted. The wound margin is thickened. There is large (67-100%) red granulation within the wound bed. There is no necrotic tissue within the wound bed. Assessment Active Problems ICD-10 Non-pressure chronic ulcer of other part of right foot with other specified severity Other idiopathic peripheral autonomic neuropathy Procedures Wound #12 Pre-procedure diagnosis of Wound #12 is a Neuropathic Ulcer-Non Diabetic located on the Right Metatarsal head first . There was a Excisional Skin/Subcutaneous Tissue Debridement with a total area of 4.25 sq cm performed by [name]., MD. With the following instrument(s): Curette to remove Viable and Non-Viable tissue/material. Material removed includes Callus, Subcutaneous Tissue, Slough, and Skin: Epidermis. No specimens were taken. A time out was conducted at 10:55, prior to the start of the procedure. A Minimum amount of bleeding was controlled with Silver Nitrate. The procedure was tolerated well with a pain  level of 0 throughout and a pain level of 0 following the procedure. Post Debridement Measurements: 2.5cm length x 1.7cm width x 0.8cm depth; 2.67cm^3 volume. Character of Wound/Ulcer Post Debridement is improved. Post procedure Diagnosis Wound #12: Same as Pre-Procedure Plan Follow-up Appointments: Return Appointment in 2 weeks. Dressing Change Frequency: Wound #12 Right Metatarsal head first: Change Dressing  every other day. Skin Barriers/Peri-Wound Care: Moisturizing lotion - to feet daily Wound Cleansing: Wound #12 Right Metatarsal head first: Clean wound with Wound Cleanser - or normal saline Primary Wound Dressing: Wound #12 Right Metatarsal head first: Polymem Silver Secondary Dressing: Wound #12 Right Metatarsal head first: Kerlix/Rolled Gauze Dry Gauze Other: - felt callous pad or foam donut Off-Loading: Open toe surgical shoe to: - right foot Home Health: [place] [facility] for wound care. - [facility] 1. Continue with polymen Ag 2. This wound is going to require debridement every time he is here he is getting a better surface area. 3. He is offloading this with crutches this is partially to support the wound area on the left Electronic Signature(s) Signed: 09/01/2020 4:54:01 PM By: [name] MD Entered By: [name] on 09/01/2020 11:27:15 -------------------------------------------------------------------------------- SuperBill Details Patient Name: Date of Service: [name], [name] 09/01/2020 Medical Record Number: [id number] Patient Account Number: [id number] Date of Birth/Sex: Treating RN: 12/01/1955 (64 y.o. [name] Primary Care Provider: [name] Other Clinician: Referring Provider: Treating Provider/Extender: [name] in Treatment: 10 Diagnosis Coding ICD-10 Codes Code Description [phone] Non-pressure chronic ulcer of other part of right foot with other specified severity G90.09 Other idiopathic peripheral autonomic neuropathy Facility Procedures CPT4 Code: [id number] Description: 70340 - DEB SUBQ TISSUE 20 SQ CM/< ICD-10 Diagnosis Description L97.518 Non-pressure chronic ulcer of other part of right foot with other specified sev Modifier: erity Quantity: 1 Physician Procedures : CPT4 Code Description Modifier [id number] - WC PHYS SUBQ TISS 20 SQ CM ICD-10 Diagnosis Description  L97.518 Non-pressure chronic ulcer of other part of right foot with other specified severity Quantity: 1 Electronic Signature(s) Signed: 09/01/2020 4:54:01 PM By: [name] MD Entered By: [name] on 09/01/2020 11:27:28

## 2020-09-02 DIAGNOSIS — N189 Chronic kidney disease, unspecified: Secondary | ICD-10-CM | POA: Diagnosis not present

## 2020-09-02 DIAGNOSIS — G9009 Other idiopathic peripheral autonomic neuropathy: Secondary | ICD-10-CM | POA: Diagnosis not present

## 2020-09-02 DIAGNOSIS — T8744 Infection of amputation stump, left lower extremity: Secondary | ICD-10-CM | POA: Diagnosis not present

## 2020-09-02 DIAGNOSIS — T8781 Dehiscence of amputation stump: Secondary | ICD-10-CM | POA: Diagnosis not present

## 2020-09-02 DIAGNOSIS — J449 Chronic obstructive pulmonary disease, unspecified: Secondary | ICD-10-CM | POA: Diagnosis not present

## 2020-09-02 DIAGNOSIS — L97512 Non-pressure chronic ulcer of other part of right foot with fat layer exposed: Secondary | ICD-10-CM | POA: Diagnosis not present

## 2020-09-02 DIAGNOSIS — L97526 Non-pressure chronic ulcer of other part of left foot with bone involvement without evidence of necrosis: Secondary | ICD-10-CM | POA: Diagnosis not present

## 2020-09-02 DIAGNOSIS — I129 Hypertensive chronic kidney disease with stage 1 through stage 4 chronic kidney disease, or unspecified chronic kidney disease: Secondary | ICD-10-CM | POA: Diagnosis not present

## 2020-09-02 DIAGNOSIS — M86672 Other chronic osteomyelitis, left ankle and foot: Secondary | ICD-10-CM | POA: Diagnosis not present

## 2020-09-03 ENCOUNTER — Ambulatory Visit (INDEPENDENT_AMBULATORY_CARE_PROVIDER_SITE_OTHER): Payer: Medicare PPO | Admitting: Physician Assistant

## 2020-09-03 ENCOUNTER — Encounter: Payer: Self-pay | Admitting: Physician Assistant

## 2020-09-03 VITALS — Ht >= 80 in | Wt 369.0 lb

## 2020-09-03 DIAGNOSIS — M25562 Pain in left knee: Secondary | ICD-10-CM

## 2020-09-03 MED ORDER — LIDOCAINE HCL 1 % IJ SOLN
5.0000 mL | INTRAMUSCULAR | Status: AC | PRN
  Administered 2020-09-03: 5 mL

## 2020-09-03 MED ORDER — METHYLPREDNISOLONE ACETATE 40 MG/ML IJ SUSP
40.0000 mg | INTRAMUSCULAR | Status: AC | PRN
  Administered 2020-09-03: 40 mg via INTRA_ARTICULAR

## 2020-09-03 NOTE — Progress Notes (Signed)
Office Visit Note   Patient: [name].           Date of Birth: Mar 26, 1956           MRN: [id number] Visit Date: 09/03/2020              Requested by: [name], MD [address] [place],  [place] [address] PCP: [name], MD  Chief Complaint  Patient presents with  . Left Foot - Routine Post Op    05/30/20 left transmet amputation       HPI: This is a pleasant gentleman who follows up 3 months status post left foot transmetatarsal amputation.  The medial portion of his wound had a dehiscence early on and has been slow to heal.  He is using a nitroglycerin patch.  He is also receiving wound care.  At home.  He continues to smoke.  He feels things are slowly improving. He also has a history of left knee arthritis and is wondering if he could get an injection today.  He states he had a fracture about a year ago but was overall doing well and would use Voltaren gel as needed but now he twisted his knee a little bit and it seems to be more painful denies falling on his knee. Assessment & Plan: Visit Diagnoses: No diagnosis found.  Plan: Placed a piece of silver cell over the wound and wrapped it.  He will continue current wound care.  I do think he is improved but considering he is now 3 months out from the procedure I would like Dr. Sharol Given to evaluate this to see if a revision may be needed.  He will follow-up with him in 2 weeks. I will go forward and inject his knee today if he had continued left knee pain at his next visit I would recommend x-rays of his knee.  He does have a history of injury to this knee approximately a year ago with a CT scan that demonstrated an avulsion fracture of the posterior tibial spine and fracture of the lateral femoral condyle  Follow-Up Instructions: No follow-ups on file.   Ortho Exam  Patient is alert, oriented, no adenopathy, well-dressed, normal affect, normal respiratory effort. Left knee: No effusion.  No erythema no cellulitis  he is tender over the lateral joint line which is accentuated with terminal extension.  Left foot.  Transmetatarsal amputation is completely healed laterally.  He has some eschar over the midportion which was debrided at his last visit and seems to be improving.  He does have a wound dehiscence approximately 4 cm in diameter medially.  There is some hyper granulation tissue that was touched with a silver nitrate stick.  I could probe about 2 cm and but could not touch bone.  No foul odor no surrounding cellulitis  Imaging: No results found. No images are attached to the encounter.  Labs: Lab Results  Component Value Date   HGBA1C 6.0 (H) 08/23/2017   HGBA1C 5.3 03/12/2014   HGBA1C 5.4 07/04/2012   ESRSEDRATE 14 06/14/2017   ESRSEDRATE 30 05/17/2017   ESRSEDRATE 23 (H) 01/31/2015   CRP 23.4 (H) 06/14/2017   CRP 37.4 (H) 05/17/2017   CRP 55.8 (H) 03/29/2017   REPTSTATUS 09/17/2010 FINAL 08/03/2010   GRAMSTAIN  08/02/2010    FEW WBC PRESENT, PREDOMINANTLY PMN NO SQUAMOUS EPITHELIAL CELLS SEEN RARE GRAM NEGATIVE RODS   CULT NO ACID FAST BACILLI ISOLATED IN 6 WEEKS 08/03/2010     Lab  Results  Component Value Date   ALBUMIN 4.0 03/12/2014   ALBUMIN 3.0 (L) 07/04/2012   ALBUMIN 1.7 (L) 08/02/2010   PREALBUMIN 4.3 (L) 08/02/2010    Lab Results  Component Value Date   MG 2.4 07/04/2012   MG 2.2 08/01/2010   No results found for: [facility]  Lab Results  Component Value Date   PREALBUMIN 4.3 (L) 08/02/2010   CBC EXTENDED Latest Ref Rng & Units 05/30/2020 01/02/2018 01/01/2018  WBC 4.0 - 10.5 K/uL 9.0 12.4(H) 15.1(H)  RBC 4.22 - 5.81 MIL/uL 4.67 4.16(L) 4.09(L)  HGB 13.0 - 17.0 g/dL 13.6 12.5(L) 12.4(L)  HCT 39 - 52 % 43.3 38.4(L) 37.6(L)  PLT 150 - 400 K/uL 445(H) 223 232  NEUTROABS 1 - 7 x10E3/uL - - -  LYMPHSABS 0 - 3 x10E3/uL - - -     Body mass index is 40.54 kg/m.  Orders:  No orders of the defined types were placed in this encounter.  No orders of the defined types  were placed in this encounter.    Procedures: Large Joint Inj on 09/03/2020 10:38 AM Indications: pain and diagnostic evaluation Details: 22 G 1.5 in needle, anterolateral approach  Arthrogram: No  Medications: 40 mg methylPREDNISolone acetate 40 MG/ML; 5 mL lidocaine 1 % Outcome: tolerated well, no immediate complications Procedure, treatment alternatives, risks and benefits explained, specific risks discussed. Consent was given by the patient.      Clinical Data: No additional findings.  ROS:  All other systems negative, except as noted in the HPI. Review of Systems  Objective: Vital Signs: Ht 6\' 8"  (2.032 m)   Wt (!) 369 lb (167.4 kg)   BMI 40.54 kg/m   Specialty Comments:  No specialty comments available.  PMFS History: Patient Active Problem List   Diagnosis Date Noted  . Abscess of left foot 05/30/2020  . Subacute osteomyelitis, left ankle and foot ([place])   . Skin ulcers of both feet ([place]) 10/24/2017  . Colovesical fistula   . Benign neoplasm of cecum   . Chronic venous insufficiency 07/20/2017  . Chronic left-sided low back pain without sciatica 11/08/2016  . Toe ulcer ([place]) 03/08/2016  . Verruca 12/28/2015  . Pre-ulcerative calluses 12/28/2015  . History of adenomatous polyp of colon 06/09/2015  . Lipoma 03/13/2014  . Plantar warts 07/09/2012  . Cellulitis 07/04/2012  . Neuropathy 07/04/2012  . Hyperglycemia 07/04/2012  . HYPERTENSION, BENIGN 10/20/2010  . EDEMA 08/20/2010  . WISDOM TEETH EXTRACTION, HX OF 08/20/2010  . HYPOALBUMINEMIA 08/19/2010  . ANEMIA 08/19/2010  . NICOTINE ADDICTION 08/19/2010  . NECROTIZING PNEUMONIA 08/19/2010  . OSTEOARTHRITIS 08/19/2010   Past Medical History:  Diagnosis Date  . Arthritis   . Chronic kidney disease    mass r kidney - partial nephrectomy  . COPD (chronic obstructive pulmonary disease) ([place])   . Diverticulitis   . History of kidney stones   . Hx of adenomatous colonic polyps 06/09/2015  .  Hyperlipidemia   . Hypertension    has previously been on medication but then taken off - 05/29/20 pt states he's never been treated for HTN  . Neuromuscular disorder (H[facility])    neuropathy lower legs and feet  . Neuropathy    in both feet  . Pneumonia 2011    Family History  Problem Relation Age of Onset  . Heart disease Mother   . Esophageal cancer Mother   . Diabetes Father   . Pancreatic cancer Father   . Breast cancer Sister   .  Colon cancer Neg Hx   . Rectal cancer Neg Hx   . Stomach cancer Neg Hx   . Colon polyps Neg Hx     Past Surgical History:  Procedure Laterality Date  . AMPUTATION Bilateral 06/05/2013   Procedure: RIGHT GREAT TOE AMPUTATION/LEFT GREAT TOE DEBRIDEMENT;  Surgeon: Wylene Simmer, MD;  Location: [place];  Service: Orthopedics;  Laterality: Bilateral;  . AMPUTATION Left 05/30/2020   Procedure: LEFT TRANSMETATARSAL AMPUTATION;  Surgeon: Newt Minion, MD;  Location: [place];  Service: Orthopedics;  Laterality: Left;  . APPLICATION OF WOUND VAC Left 05/30/2020   Procedure: APPLICATION OF WOUND VAC;  Surgeon: Newt Minion, MD;  Location: [place];  Service: Orthopedics;  Laterality: Left;  . COLON SURGERY     robotic partial colectomy Dr. Marcello Moores 12-30-17  . COLONOSCOPY     x 2 last date 09/21/2017 bladder attached to colon eval  . COLONOSCOPY WITH PROPOFOL N/A 09/21/2017   Procedure: COLONOSCOPY WITH PROPOFOL;  Surgeon: [name], MD;  Location: [facility];  Service: Endoscopy;  Laterality: N/A;  . MOUTH SURGERY    . PARTIAL NEPHRECTOMY     right Dr. Leighton Ruff 06-04-10  . ROBOTIC ASSITED PARTIAL NEPHRECTOMY Right 12/30/2017   Procedure: XI ROBOTIC ASSITED RIGHT PARTIAL NEPHRECTOMY;  Surgeon: Alexis Frock, MD;  Location: [facility];  Service: Urology;  Laterality: Right;  . TOE AMPUTATION Right 06/05/2013   RIGHT GREAT TOE PARTIAL AMPUTATION    . TOOTH EXTRACTION  2011   multiple teeth extracted   Social History   Occupational History  . Occupation: retired    Tobacco Use  . Smoking status: Current Some Day Smoker    Packs/day: 0.10    Years: 20.00    Pack years: 2.00    Types: Cigarettes    Start date: 11/29/1973  . Smokeless tobacco: Never Used  . Tobacco comment: occasional smoker - ~1 pack/week. smoked ~1.5ppd until 5 yr ago  Vaping Use  . Vaping Use: Never used  Substance and Sexual Activity  . Alcohol use: No    Alcohol/week: 0.0 standard drinks  . Drug use: No  . Sexual activity: Not Currently    Partners: Female

## 2020-09-06 ENCOUNTER — Other Ambulatory Visit: Payer: Self-pay | Admitting: Physician Assistant

## 2020-09-08 DIAGNOSIS — T8744 Infection of amputation stump, left lower extremity: Secondary | ICD-10-CM | POA: Diagnosis not present

## 2020-09-08 DIAGNOSIS — N189 Chronic kidney disease, unspecified: Secondary | ICD-10-CM | POA: Diagnosis not present

## 2020-09-08 DIAGNOSIS — L97512 Non-pressure chronic ulcer of other part of right foot with fat layer exposed: Secondary | ICD-10-CM | POA: Diagnosis not present

## 2020-09-08 DIAGNOSIS — L97526 Non-pressure chronic ulcer of other part of left foot with bone involvement without evidence of necrosis: Secondary | ICD-10-CM | POA: Diagnosis not present

## 2020-09-08 DIAGNOSIS — G9009 Other idiopathic peripheral autonomic neuropathy: Secondary | ICD-10-CM | POA: Diagnosis not present

## 2020-09-08 DIAGNOSIS — T8781 Dehiscence of amputation stump: Secondary | ICD-10-CM | POA: Diagnosis not present

## 2020-09-08 DIAGNOSIS — M86672 Other chronic osteomyelitis, left ankle and foot: Secondary | ICD-10-CM | POA: Diagnosis not present

## 2020-09-08 DIAGNOSIS — I129 Hypertensive chronic kidney disease with stage 1 through stage 4 chronic kidney disease, or unspecified chronic kidney disease: Secondary | ICD-10-CM | POA: Diagnosis not present

## 2020-09-08 DIAGNOSIS — J449 Chronic obstructive pulmonary disease, unspecified: Secondary | ICD-10-CM | POA: Diagnosis not present

## 2020-09-12 DIAGNOSIS — J449 Chronic obstructive pulmonary disease, unspecified: Secondary | ICD-10-CM | POA: Diagnosis not present

## 2020-09-12 DIAGNOSIS — G9009 Other idiopathic peripheral autonomic neuropathy: Secondary | ICD-10-CM | POA: Diagnosis not present

## 2020-09-12 DIAGNOSIS — T8744 Infection of amputation stump, left lower extremity: Secondary | ICD-10-CM | POA: Diagnosis not present

## 2020-09-12 DIAGNOSIS — L97512 Non-pressure chronic ulcer of other part of right foot with fat layer exposed: Secondary | ICD-10-CM | POA: Diagnosis not present

## 2020-09-12 DIAGNOSIS — L97526 Non-pressure chronic ulcer of other part of left foot with bone involvement without evidence of necrosis: Secondary | ICD-10-CM | POA: Diagnosis not present

## 2020-09-12 DIAGNOSIS — M86672 Other chronic osteomyelitis, left ankle and foot: Secondary | ICD-10-CM | POA: Diagnosis not present

## 2020-09-12 DIAGNOSIS — T8781 Dehiscence of amputation stump: Secondary | ICD-10-CM | POA: Diagnosis not present

## 2020-09-12 DIAGNOSIS — N189 Chronic kidney disease, unspecified: Secondary | ICD-10-CM | POA: Diagnosis not present

## 2020-09-12 DIAGNOSIS — I129 Hypertensive chronic kidney disease with stage 1 through stage 4 chronic kidney disease, or unspecified chronic kidney disease: Secondary | ICD-10-CM | POA: Diagnosis not present

## 2020-09-15 ENCOUNTER — Other Ambulatory Visit: Payer: Self-pay

## 2020-09-15 ENCOUNTER — Encounter (HOSPITAL_BASED_OUTPATIENT_CLINIC_OR_DEPARTMENT_OTHER): Payer: Medicare PPO | Admitting: Internal Medicine

## 2020-09-15 DIAGNOSIS — I872 Venous insufficiency (chronic) (peripheral): Secondary | ICD-10-CM | POA: Diagnosis not present

## 2020-09-15 DIAGNOSIS — G9009 Other idiopathic peripheral autonomic neuropathy: Secondary | ICD-10-CM | POA: Diagnosis not present

## 2020-09-15 DIAGNOSIS — L280 Lichen simplex chronicus: Secondary | ICD-10-CM | POA: Diagnosis not present

## 2020-09-15 DIAGNOSIS — L97518 Non-pressure chronic ulcer of other part of right foot with other specified severity: Secondary | ICD-10-CM | POA: Diagnosis not present

## 2020-09-15 DIAGNOSIS — Z89411 Acquired absence of right great toe: Secondary | ICD-10-CM | POA: Diagnosis not present

## 2020-09-15 DIAGNOSIS — G629 Polyneuropathy, unspecified: Secondary | ICD-10-CM | POA: Diagnosis not present

## 2020-09-15 DIAGNOSIS — I10 Essential (primary) hypertension: Secondary | ICD-10-CM | POA: Diagnosis not present

## 2020-09-15 DIAGNOSIS — L97512 Non-pressure chronic ulcer of other part of right foot with fat layer exposed: Secondary | ICD-10-CM | POA: Diagnosis not present

## 2020-09-15 DIAGNOSIS — M1990 Unspecified osteoarthritis, unspecified site: Secondary | ICD-10-CM | POA: Diagnosis not present

## 2020-09-15 NOTE — Progress Notes (Addendum)
[name], [name] ([id number]) Visit Report for 09/15/2020 Arrival Information Details Patient Name: Date of Service: [name], [name] 09/15/2020 10:15 A M Medical Record Number: [id number] Patient Account Number: [id number] Date of Birth/Sex: Treating RN: [birth year] (64 y.o. [name] Primary Care Leilah Polimeni: [name] Other Clinician: Referring Meranda Dechaine: Treating Markies Mowatt/Extender: [name] in Treatment: 12 Visit Information History Since Last Visit Added or deleted any medications: No Patient Arrived: Crutches Any new allergies or adverse reactions: No Arrival Time: 10:55 Had a fall or experienced change in No Accompanied By: self activities of daily living that may affect Transfer Assistance: None risk of falls: Patient Identification Verified: Yes Signs or symptoms of abuse/neglect since No Secondary Verification Process Completed: Yes last visito Patient Requires Transmission-Based Precautions: No Hospitalized since last visit: No Patient Has Alerts: No Implantable device outside of the clinic No excluding cellular tissue based products placed in the center since last visit: Has Dressing in Place as Prescribed: Yes Has Footwear/Offloading in Place as Yes Prescribed: Left: Surgical Shoe with Pressure Relief Insole Right: Surgical Shoe with Pressure Relief Insole Pain Present Now: No Electronic Signature(s) Signed: 09/15/2020 5:10:38 PM By: [name] RN, BSN Entered By: [name] on 09/15/2020 10:56:33 -------------------------------------------------------------------------------- Lower Extremity Assessment Details Patient Name: Date of Service: [name], [name] 09/15/2020 10:15 A M Medical Record Number: [id number] Patient Account Number: [id number] Date of Birth/Sex: Treating RN: 1956/05/17 (64 y.o. [name] Primary Care Janesha Brissette: [name] Other Clinician: Referring Ayris Carano: Treating  [name]/Extender: [name] in Treatment: 12 Edema Assessment Assessed: [Left: No] [Right: No] Edema: [Left: N] [Right: o] Calf Left: Right: Point of Measurement: From Medial Instep 42 cm Ankle Left: Right: Point of Measurement: From Medial Instep 25.5 cm Vascular Assessment Pulses: Dorsalis Pedis Palpable: [Right:Yes] Electronic Signature(s) Signed: 09/15/2020 5:10:38 PM By: [name] RN, BSN Entered By: [name] on 09/15/2020 11:02:07 -------------------------------------------------------------------------------- Multi Wound Chart Details Patient Name: Date of Service: [name], [name] 09/15/2020 10:15 A M Medical Record Number: [id number] Patient Account Number: [id number] Date of Birth/Sex: Treating RN: May 06, 1956 (64 y.o. [name] Primary Care Darcey Cardy: [name] Other Clinician: Referring Erol Flanagin: Treating Elma Limas/Extender: [name] in Treatment: 12 Vital Signs Height(in): 1 Pulse(bpm): 49 Weight(lbs): 350 Blood Pressure(mmHg): 151/84 Body Mass Index(BMI): 38 Temperature(F): 98.0 Respiratory Rate(breaths/min): 20 Photos: [12:No Photos Right Metatarsal head first] [N/A:N/A N/A] Wound Location: [12:Gradually Appeared] [N/A:N/A] Wounding Event: [12:Neuropathic Ulcer-Non Diabetic] [N/A:N/A] Primary Etiology: [12:Anemia, Chronic Obstructive] [N/A:N/A] Comorbid History: [12:Pulmonary Disease (COPD), Hypertension, Osteoarthritis, Osteomyelitis, Neuropathy 11/29/2018] [N/A:N/A] Date Acquired: [12:12] [N/A:N/A] Weeks of Treatment: [12:Open] [N/A:N/A] Wound Status: [12:2.3x2x0.4] [N/A:N/A] Measurements L x W x D (cm) [12:3.613] [N/A:N/A] A (cm) : rea [12:1.445] [N/A:N/A] Volume (cm) : [12:61.30%] [N/A:N/A] % Reduction in A [12:rea: 22.60%] [N/A:N/A] % Reduction in Volume: [12:Full Thickness Without Exposed] [N/A:N/A] Classification: [12:Support Structures Medium]  [N/A:N/A] Exudate A mount: [12:Serosanguineous] [N/A:N/A] Exudate Type: [12:red, brown] [N/A:N/A] Exudate Color: [12:Thickened] [N/A:N/A] Wound Margin: [12:Large (67-100%)] [N/A:N/A] Granulation A mount: [12:Red] [N/A:N/A] Granulation Quality: [12:None Present (0%)] [N/A:N/A] Necrotic A mount: [12:Fat Layer (Subcutaneous Tissue): Yes N/A] Exposed Structures: [12:Fascia: No Tendon: No Muscle: No Joint: No Bone: No Small (1-33%)] [N/A:N/A] Epithelialization: [12:Debridement - Excisional] [N/A:N/A] Debridement: Pre-procedure Verification/Time Out 11:22 [N/A:N/A] Taken: [12:Callus, Subcutaneous] [N/A:N/A] Tissue Debrided: [12:Skin/Subcutaneous Tissue] [N/A:N/A] Level: [12:4.6] [N/A:N/A] Debridement A (sq cm): [12:rea Blade] [N/A:N/A] Instrument: [12:Moderate] [N/A:N/A] Bleeding: [12:Pressure] [N/A:N/A] Hemostasis A chieved: [12:0] [N/A:N/A] Procedural Pain: [12:0] [  N/A:N/A] Post Procedural Pain: [12:Procedure was tolerated well] [N/A:N/A] Debridement Treatment Response: [12:2.3x2x0.4] [N/A:N/A] Post Debridement Measurements L x W x D (cm) [12:1.445] [N/A:N/A] Post Debridement Volume: (cm) [12:Debridement] [N/A:N/A] Treatment Notes Electronic Signature(s) Signed: 09/16/2020 4:22:08 PM By: [name] MD Signed: 09/16/2020 5:04:42 PM By: Levan Hurst RN, BSN Entered By: [name] on 09/16/2020 07:29:47 -------------------------------------------------------------------------------- Multi-Disciplinary Care Plan Details Patient Name: Date of Service: [name], [name] 09/15/2020 10:15 A M Medical Record Number: [id number] Patient Account Number: [id number] Date of Birth/Sex: Treating RN: [birth year] (64 y.o. [name] Primary Care Makira Holleman: [name] Other Clinician: Referring Damarys Speir: Treating Mahek Schlesinger/Extender: [name] in Treatment: 12 Active Inactive Wound/Skin Impairment Nursing Diagnoses: Impaired tissue  integrity Knowledge deficit related to ulceration/compromised skin integrity Goals: Patient/caregiver will verbalize understanding of skin care regimen Date Initiated: 06/17/2020 Target Resolution Date: 09/19/2020 Goal Status: Active Ulcer/skin breakdown will have a volume reduction of 30% by week 4 Date Initiated: 06/17/2020 Date Inactivated: 07/21/2020 Target Resolution Date: 07/18/2020 Goal Status: Met Interventions: Assess patient/caregiver ability to obtain necessary supplies Assess patient/caregiver ability to perform ulcer/skin care regimen upon admission and as needed Assess ulceration(s) every visit Provide education on ulcer and skin care Notes: Electronic Signature(s) Signed: 09/15/2020 5:42:40 PM By: Levan Hurst RN, BSN Entered By: Levan Hurst on 09/15/2020 12:15:45 -------------------------------------------------------------------------------- Pain Assessment Details Patient Name: Date of Service: [name], [name] 09/15/2020 10:15 A M Medical Record Number: [id number] Patient Account Number: [id number] Date of Birth/Sex: Treating RN: March 16, 1956 (64 y.o. [name] Primary Care Yaritzel Stange: [name] Other Clinician: Referring Janiylah Hannis: Treating Maaliyah Adolph/Extender: [name] in Treatment: 12 Active Problems Location of Pain Severity and Description of Pain Patient Has Paino No Site Locations Pain Management and Medication Current Pain Management: Electronic Signature(s) Signed: 09/15/2020 5:10:38 PM By: [name] RN, BSN Entered By: [name] on 09/15/2020 11:00:48 -------------------------------------------------------------------------------- Patient/Caregiver Education Details Patient Name: Date of Service: Regino Schultze MS, [name] 10/18/2021andnbsp10:15 A M Medical Record Number: [id number] Patient Account Number: [id number] Date of Birth/Gender: Treating RN: 1956-03-24 (64 y.o. [name] Primary Care Physician: [name] Other Clinician: Referring Physician: Treating Physician/Extender: [name] in Treatment: 12 Education Assessment Education Provided To: Patient Education Topics Provided Wound/Skin Impairment: Methods: Explain/Verbal Responses: State content correctly [employer]) Signed: 09/15/2020 5:42:40 PM By: Levan Hurst RN, BSN Entered By: Levan Hurst on 09/15/2020 12:16:13 -------------------------------------------------------------------------------- Wound Assessment Details Patient Name: Date of Service: [name], [name] 09/15/2020 10:15 A M Medical Record Number: [id number] Patient Account Number: [id number] Date of Birth/Sex: Treating RN: 29-Oct-1956 (64 y.o. [name] Primary Care Eddison Searls: [name] Other Clinician: Referring Suhani Stillion: Treating Reannah Totten/Extender: [name] in Treatment: 12 Wound Status Wound Number: 12 Primary Neuropathic Ulcer-Non Diabetic Etiology: Wound Location: Right Metatarsal head first Wound Open Wounding Event: Gradually Appeared Status: Date Acquired: 11/29/2018 Comorbid Anemia, Chronic Obstructive Pulmonary Disease (COPD), Weeks Of Treatment: 12 History: Hypertension, Osteoarthritis, Osteomyelitis, Neuropathy Clustered Wound: No Photos Photo Uploaded By: Mikeal Hawthorne on 09/16/2020 14:02:23 Wound Measurements Length: (cm) 2.3 Width: (cm) 2 Depth: (cm) 0.4 Area: (cm) 3.613 Volume: (cm) 1.445 % Reduction in Area: 61.3% % Reduction in Volume: 22.6% Epithelialization: Small (1-33%) Tunneling: No Undermining: No Wound Description Classification: Full Thickness Without Exposed Support Structures Wound Margin: Thickened Exudate Amount: Medium Exudate Type: Serosanguineous Exudate Color: red, brown Foul Odor After Cleansing: No Slough/Fibrino No Wound Bed Granulation Amount: Large (67-100%)  Exposed Structure Granulation Quality: Red Fascia Exposed: No Necrotic Amount: None Present (0%) Fat Layer (Subcutaneous Tissue) Exposed: Yes Tendon Exposed: No Muscle Exposed: No Joint Exposed: No Bone Exposed: No Electronic Signature(s) Signed: 09/15/2020 5:10:38 PM By: [name] RN, BSN Entered By: [name] on 09/15/2020 11:04:38 -------------------------------------------------------------------------------- [place] Details Patient Name: Date of Service: [name], [name] 09/15/2020 10:15 A M Medical Record Number: [id number] Patient Account Number: [id number] Date of Birth/Sex: Treating RN: 1956-03-06 (64 y.o. [name] Primary Care Marcia Hartwell: [name] Other Clinician: Referring Karrington Studnicka: Treating Akeia Perot/Extender: [name] in Treatment: 12 Vital Signs Time Taken: 10:56 Temperature (F): 98.0 Height (in): 80 Pulse (bpm): 76 Source: Stated Respiratory Rate (breaths/min): 20 Weight (lbs): 350 Blood Pressure (mmHg): 151/84 Source: Stated Reference Range: 80 - 120 mg / dl Body Mass Index (BMI): 38.4 Electronic Signature(s) Signed: 09/15/2020 5:10:38 PM By: [name] RN, BSN Entered By: [name] on 09/15/2020 11:00:42

## 2020-09-16 DIAGNOSIS — G9009 Other idiopathic peripheral autonomic neuropathy: Secondary | ICD-10-CM | POA: Diagnosis not present

## 2020-09-16 DIAGNOSIS — T8744 Infection of amputation stump, left lower extremity: Secondary | ICD-10-CM | POA: Diagnosis not present

## 2020-09-16 DIAGNOSIS — T8781 Dehiscence of amputation stump: Secondary | ICD-10-CM | POA: Diagnosis not present

## 2020-09-16 DIAGNOSIS — L97512 Non-pressure chronic ulcer of other part of right foot with fat layer exposed: Secondary | ICD-10-CM | POA: Diagnosis not present

## 2020-09-16 DIAGNOSIS — L97526 Non-pressure chronic ulcer of other part of left foot with bone involvement without evidence of necrosis: Secondary | ICD-10-CM | POA: Diagnosis not present

## 2020-09-16 DIAGNOSIS — M86672 Other chronic osteomyelitis, left ankle and foot: Secondary | ICD-10-CM | POA: Diagnosis not present

## 2020-09-16 DIAGNOSIS — I129 Hypertensive chronic kidney disease with stage 1 through stage 4 chronic kidney disease, or unspecified chronic kidney disease: Secondary | ICD-10-CM | POA: Diagnosis not present

## 2020-09-16 DIAGNOSIS — N189 Chronic kidney disease, unspecified: Secondary | ICD-10-CM | POA: Diagnosis not present

## 2020-09-16 DIAGNOSIS — J449 Chronic obstructive pulmonary disease, unspecified: Secondary | ICD-10-CM | POA: Diagnosis not present

## 2020-09-16 NOTE — Progress Notes (Signed)
TEREL, BANN ([id number]) Visit Report for 09/15/2020 Debridement Details Patient Name: Date of Service: [name], [name] 09/15/2020 10:15 A M Medical Record Number: [id number] Patient Account Number: [id number] Date of Birth/Sex: Treating RN: Aug 31, 1956 (64 y.o. [name] Primary Care Provider: [name] Other Clinician: Referring Provider: Treating Provider/Extender: [name] in Treatment: 12 Debridement Performed for Assessment: Wound #12 Right Metatarsal head first Performed By: Physician [name]., MD Debridement Type: Debridement Level of Consciousness (Pre-procedure): Awake and Alert Pre-procedure Verification/Time Out Yes - 11:22 Taken: Start Time: 75:22 T Area Debrided (L x W): otal 2.3 (cm) x 2 (cm) = 4.6 (cm) Tissue and other material debrided: Viable, Non-Viable, Callus, Subcutaneous Level: Skin/Subcutaneous Tissue Debridement Description: Excisional Instrument: Blade Bleeding: Moderate Hemostasis Achieved: Pressure End Time: 11:23 Procedural Pain: 0 Post Procedural Pain: 0 Response to Treatment: Procedure was tolerated well Level of Consciousness (Post- Awake and Alert procedure): Post Debridement Measurements of Total Wound Length: (cm) 2.3 Width: (cm) 2 Depth: (cm) 0.4 Volume: (cm) 1.445 Character of Wound/Ulcer Post Debridement: Improved Post Procedure Diagnosis Same as Pre-procedure Electronic Signature(s) Signed: 09/16/2020 4:22:08 PM By: [name] MD Signed: 09/16/2020 5:04:42 PM By: Levan Hurst RN, BSN Previous Signature: 09/15/2020 5:42:40 PM Version By: Levan Hurst RN, BSN Entered By: [name] on 09/16/2020 07:29:57 -------------------------------------------------------------------------------- HPI Details Patient Name: Date of Service: [name], [name] 09/15/2020 10:15 A M Medical Record Number: [id number] Patient Account Number: [id number] Date of Birth/Sex:  Treating RN: [birth year] (64 y.o. [name] Primary Care Provider: [name] Other Clinician: Referring Provider: Treating Provider/Extender: [name] in Treatment: 12 History of Present Illness Location: right first metatarsal head on the plantar aspect and the left big toe and the right fifth toe Quality: Patient reports No Pain. Severity: Patient states wound(s) are getting worse. Duration: Patient has had the wound for > 24 months prior to seeking treatment at the wound center Context: The wound would happen gradually Modifying Factors: Wound improving due to current treatment. he has been under podiatry care for over a year and a half ssociated Signs and Symptoms: Patient reports having: heaviness in both legs A HPI Description: This 64 year old gentleman who has had a long-standing neuropathy of both feet without history of diabetes mellitus has never been worked up for his neurological problem in the last 5 years or so. Most recently he has been seen by the podiatrist Dr. [name] who was been seeing them for about a year and a half and from what I understand has been treating him for bilateral ulceration on the feet, the right being in the region of the first metatarsal head plantar aspect and the left being in the area of the left big toe. He's had various antibiotics including Levaquin recently and this was then switched to Cipro and clindamycin. Due to the nature of this wound which has been there for a long while he has been recently referred to [name] for an opinion. An MRI was done in the middle of June which showed several small abscesses near the amputation site of the right great toe with draining and open wounds with surrounding cellulitis but no evidence of septic arthritis, osteomyelitis or pyomyositis. I understand at this stage he was taken to the operating room by [name] who debrided the wound and cleaned out the abscess. I  understand ABIs were done bilaterally and they were within normal limits at Dr. [name] office. The  patient is a smoker and is working on quitting smoking. 07/13/2017 -- had a lower extremity arterial duplex evaluation which showed patent bilateral lower extremity arteries without evidence of hemodynamically significant stenosis. His right ABI was 1.05 and the left was 0.95 He had a lower extremity venous duplex reflux evaluation done which showed significant venous incompetence in the bilateral common femoral, saphenofemoral junction, small saphenous vein and in the right great saphenous vein. With these findings I believe he will benefit from a consultation with the vascular surgeon regarding the endovenous ablation procedure. the patient's neurology appointment and dermatology appointment is still pending. He has had a x-ray done by his PCP and the results are not available. However he did see [name] who I understand has ordered a bilateral MRI of his feet. 07/26/2017 -- was seen by [name] on 07/20/2017. His impression was that he presented with minimal bilateral lower extremity peripheral arterial disease, bilateral lower extremity chronic venous insufficiency (C4) and radiculopathy versus neuropathy bilateral lower extremity. No arterial intervention was needed at the present time. He recommended maximal medical management at this stage for his atherosclerotic disease . He recommended compression to be continued and no venous intervention at the present time. He was offered follow-up in the Vein clinic in 3 months time. MR of the right foot done with and without contrast -- IMPRESSION:Wound on the plantar surface of the foot at approximately the level of first MTP joint with a tiny underlying soft tissue abscess. Large area of soft tissue edema subjacent to the wound is most compatible with granulation tissue. Negative for osteomyelitis about the first MTP joint. Mild edema and  enhancement in the proximal phalanx of the little toe is stable compared to the prior examination and may be due to stress change. Infection is possible but thought highly unlikely. Subcutaneous edema over the dorsum of the foot compatible with dependent change/cellulitis. the patient also has had a abdominal CT which shows a kidney mass and is going to have her MRI for this. He also has a urology opinion pending and a general surgeon's opinion regarding a colonic mass. 08/02/2017 -- his appointments with his other urology and surgical consultants is still later. He has an MRI of the left foot later this week. 08/09/2017 --MR of the left foot with and without contrast -- IMPRESSION:Large appearing skin wound on the medial aspect of the great toe without underlying abscess or osteomyelitis.First MTP osteoarthritis. Marked marrow edema in the medial sesamoid bone is likely related to osteoarthritis but could be due to sesamoiditis. 08/16/2017 -- the patient has had a surgical opinion and a urological opinion and a extensive surgery including possible colon resection, bladder surgery and kidney surgery is being planned. No date has been set for his procedure He continues to be off smoking 08/31/2017 -- he was seen by the neurologist for his multiple problems of neuropathic and radicular symptoms in both legs with a thorough workup done. MRIs were reviewed lab work was reviewed and the assessment was that he had severe neuropathy due to multifactorial reasons and a neuropathy panel of lab work was ordered and he would be reviewed back. Patient is also due for colonoscopy to be repeated before his bladder and kidney surgery. At the present time he would like [name] to continue with conservative and symptomatic wound care for both his feet. 10/05/2017 -- since I have seen him last time he has had a colonoscopy which was fairly normal except for a polyp and he  has a urology appointment pending later today. He  still continues to stay off smoking. 10/26/2017-- he returns after about 3 weeks and in the meanwhile he was seen by Dr. Kellie Simmering, who reviewed his venous studies and noted that there was very minimal enlargement of the bilateral great saphenous vein on the right and somewhat larger vein on the left but no consistent reflux throughout the great saphenous veins and there was also some right small saphenous veins which were enlarged with some reflux. However overall he thought that venous insufficiency was not significantly the cause of his ulceration and it was more of trophic ulcers and hence he recommended aggressive wound care, and at some stage he may need amputation of the left first toe and the right fifth toe. 11/09/2017 -- due to the inclement weather he has missed his urology appointment and is still awaiting the rescheduled one. Other than that he's been doing fairly well. 11/23/2017 -- he has had his cystoscopy done by his urologist and they found multiple areas of concern and he is going to be set up for a procedure sometime later in January. He is on complex care as far as his wounds go and seizures every 2-3 weeks 12/07/17 patient presents today for evaluation concerning his right great toe amputation site as well as his left great toe ulcer. He has been tolerating the dressing changes fairly well. With that being said there does not appear to be any significant discomfort at this point that he is experiencing although he has quite significant callous especially in regard to the amputation site of the right great toe. Nonetheless he has no evidence of infection which is good news No fevers, chills, nausea, or vomiting noted at this time. He has no pain. READMISSION 05/09/18; is a patient who hasn't been here in quite some time. He is not a diabetic but he has severe idiopathic peripheral neuropathy. He was here for review of a nonhealing wound on the right great toe amputation site and a  large portion of the tip of the left great toe. Sometime after his last visit here he became ill he had abdominal issues infections and required hospitalizations. He largely dropped off the map. He is putting silver alginate on the wounds. I note that he had arterial studies and saw Dr. Bridgett Larsson of vascular surgery was not felt to have an arterial issue. He also had reflux studies and he wears compression stockings and he is faithful with these. He had MRI of both of his feet that not show osteomyelitis question small abscess on the right. He tells me that the nonhealing wound on his right great toe amputation site is been there since the actual amputation which was in 2014 I note the area was aggressively debrided by Dr. Jacqualyn Posey of podiatry before he started coming to this clinic apparently there were abscess is present at that time. His last formal arterial studies were done in August 2018 which time his ABI was 1.05 on the right and 0.95 on the left he had biphasic and triphasic waveforms on the left and monophasic and biphasic waveforms on the right. He did not have TBI's. ABIs in our clinic today were 0.8 bilaterally. The patient states he is not a diabetic. He does have idiopathic peripheral neuropathy. He is a smoker but is trying to quit with Chantix. 05/16/18; x-rays of his bilateral feet were negative for osteomyelitis. He was noted to have a chronic deformity of the head of the second metatarsal  which may be due to previous osteonecrosis there was no definite underlying bone destruction to suggest osteomyelitis on either side. He came in with the extensive callus over his right great toe amputation site and the left great toe did extensive debridement last week and another extensive debridement today.patient is changing his dressing himself with silver alginate 05/30/18; still buildup of thick callus covering thick subcutaneous tissue around both of these areas. This requires an extensive  debridement on both sides. I have discussed offloading this area with the patient. I think he inverts when he walks. His foot sizes beyond what we can put in a Darco forefoot off loader or probably what we can put in a total contact cast. 06/13/18; still a remarkable buildup of callus over the right first toe amputation site. The left first toe has 2 open wounds one medially and 1 at the tip of the toe. Surrounding callus is also not back here. We've been using silver alginate. He has a darco forefoot off loader now on the right foot.his own shoe on the left foot. Would like to try a total contact cast on the right. He is making arrangements for someone to drive him here in 2 weeks. 06/29/18; still a buildup of callus over the right first toe amputation site although not as significant as previously. The left first toe has 2 open wounds one medially and one at the tip surrounding callus as well. We've been using silver alginate. He comes in today with the right for a total contact cast which I'll use on the right 07/04/18 on evaluation today patient appears to be doing decently well in regard to the cast. Unfortunately he did have a little area that rubbed on the lateral aspect of his leg the cast actually cracked on the medial aspect this happened he states just yesterday. He has not been walking in the cast without the boot portion on. With that being said he states that he is unsure of exactly what happened with the crack. Nonetheless this does seem to have caused a little bit of rubbing on the lateral aspect I think due to the integrity of the cast being compromised. Nonetheless I advised him that if this happens in the future he needs to let us know soon as possible. Nonetheless I do feel like the cast has been of benefit some of the area on the distal portion of his great toe amputation site actually appears to be doing better. 07/12/18 on evaluation today patient appears to be doing very well in  regard to his right great toe invitation site. This seems to be showing signs of excellent improvement which is great news. There does not appear to be any evidence of infection at this time. With that being said he has been tolerating the dressing changes without complication. There appears to be no evidence of infection and again this is barely open at this point. The total contact cast has been of excellent benefit for him. I believe this will likely be completely closed and ready next week that we can switch to a cast on the left foot. He states he may want to take a week's break. 07/19/18 on evaluation today patient appears to be doing better in regard to both ulceration areas. The right foot actually appears to be completely healed he still has some callous but no open wound. The left foot/great toe actually seems to be better after last week's debridement overall the appearance of the wound is greatly improved.  08/09/18 on evaluation today patient actually appears to be doing a little bit worse in regard to the right metatarsal invitation site. He's been tolerating the dressing changes without complication. With that being said he does note that the area actually cracked open as of today when he was in the shower trying to scrub it. Nonetheless it does appear that the Caryn Section that previously was open and draining has subsequently reopened and is draining again. Nonetheless as I was looking at him and performing the debridement today I did inquire about whether or not we had ever biopsy the area as I was concerned about the possibility for something along the lines of a work type virus/infection causing the excessive callous buildup. Especially since he seems to grow much more than what would just be traditionally expected by friction alone. He subsequently told me that he did have this biopsied somewhere around 1-2 years ago by Dr. Jacqualyn Posey and that it showed that he had a wart infection at that  point. Nonetheless he was never referred to dermatology better Jacqualyn Posey attempted to cut it out according to the patient. Nonetheless that may be part of the big issue with what we're dealing with here and without treating the wart infection we may not actually get this to heal and stay closed. READMISSION 12/07/2018 The patient is back in clinic today essentially with wounds in the same area and condition is that I think the last 2 times he has been here. This includes the right first metatarsal head in the setting of a previous right great toe amputation and a large part of the medial part of his left great toe. The patient states he has been dealing with this for years dating back to 2005 on and off. That he is never found this to be totally healed. When he was last in this clinic he had this biopsied shave biopsies which showed hyperkeratotic and parakeratotic debris. Epithelium with papillary features negative for malignancy. He has been using silver alginate. He has healing sandals which he uses Felt on the bottom of these. When he was here last time we put him a total contact cast at least for a while and the patient states on the right that helped with the buildup of hyperkeratotic tissue and that seems to be reflective in our notes. He also states that was previously biopsied by Dr. Jacqualyn Posey of podiatry. The patient has a severe idiopathic peripheral neuropathy which he attributes to a brown recluse spider bite in 2002 or so. He is not a diabetic The patient had arterial studies in this showed an ABI in the right of 1.05 on the left was 0.95. TBI's were not done. He had monophasic waveforms at the right posterior tibial biphasic at the dorsalis pedis. On the left he was biphasic at the posterior tibial and triphasic at the dorsalis pedis. 1/17; this is a patient with a very challenging issue was readmitted to the clinic last week. He has a large area over the first right MTP in the setting of  a previous right great toe amputation. On presentation this is covered with copious amounts of hyperkeratotic thick callus nonviable tissue. When this is debridement both on this occasion and previous occasions in this clinic he has a very boggy presentation to the subcutaneous tissue which looks somewhat atypical. He has a similar area on the left great toe from the tip of the toe down the medial aspect beyond the interphalangeal joint. He's had previous MRIs of both  of his feet. On the right that showed small abscesses and he was taken to the OR by podiatry for debridement and cleaning out of the abscesses. An MRI did not show osteomyelitis of the left first toe. Both the MRIs were done in 2018. He saw Dr. Vallarie Mare at the time who did not think he had significant macrovascular disease. He is not a diabetic but he has idiopathic peripheral neuropathy and was a significant smoker. When he was in the clinic earlier in 2019 he had biopsies done of both areas that showed hyperkeratotic material but no malignancy. X-rays I ordered last week did not show osteomyelitis on the left but did suggest osteomyelitis at the tip of the left great toe. I looked at the x-rays myself this is a subtle finding it is only seen on the lateral film. I did a very significant debridement on him last week bilaterally in both the wound areas look better. When he was previously in our clinic the area on the right did improve with a total contact cast. He dropped out of the schedule at that point I'm not exactly sure why 1/24; the biopsy I did of the area over his right first metatarsal head showed the possibility of verruca vulgaris. Superimposed changes of lichen simplex chronicus. If this is a plantar wart this would be extremely large. I think he would need topical destructive therapy and I am going to refer him to dermatology. There was no suggestion of malignancy. Previous biopsies in this clinic on both his areas did not show  evidence of malignancy either. Until we get dermatology I am going to continue with silver alginate. X-rays suggested osteomyelitis of the left toe I am going to continue him on trimethoprim sulfamethoxazole 2/3; not much change in this area on the right first met head. Extensive dark eschar formation around the central area of nonviable subcutaneous tissue looking like a boggy subcutaneous tissue. Using a #10 scalpel and pickups I removed all of this. I have tried this before and this man but I plan to put him in a total contact cast next week Using a #5 curette debridement of the left great toe He has an appointment with dermatology at [facility] on the 25th 2/11; right first metatarsal head after last week's extensive debridement is right back to the way it was with a nonviable subcutaneous tissue and thick surrounding eschar. The left great toe looks about the same. The patient could not have a total contact cast today because he had household problems [leaking roof] but I am not going to put a cast on him until I have information from the dermatologist about this. READMISSION 06/17/2020 Mr. Mallozzi is now a 64 year old man that we have had in this clinic on at least 2 occasions previously. When he was last here in February 2020 I referred him to dermatology at [facility] for their review of large wounds on the right first metatarsal head with surrounding severe chronic hyperkeratosis. This was biopsied on at least 2 occasions in our clinic and I think at [facility] as well that did not show an underlying malignancy. I have reviewed the consult from Dr. Sharlette Dense dermatology from 01/23/2019. At that time the patient had wounds on the plantar right first metatarsal head as well as the plantar left great toe. From what he is saying the left great toe healed however he recently had to have a left TMA by Dr. Sharol Given because of osteomyelitis in the forefoot we did not actually get  a look at his foot today. Dermatology  at [facility] apparently referred him to Dr. Luetta Nutting of plastic surgery and an operative excision of this area was planned however apparently they ran into the pandemic. The procedure was repetitively cancel and the patient did not keep his final appointment which I think was earlier this year he was also seen in February of this year at the wound care center in [facility] by Dr. Zigmund Daniel. They were applying Silvadene cream and Xeroform which she is essentially doing now. I see in his notes that there was still some concern about underlying malignancy although would be difficult to believe as long as this is been there that there would not be more extensive damage at this point in any case he is back in our clinic for review of this. He had an x-ray of the foot on 01/23/2020 that was negative for osteomyelitis acute fracture. Past medical history; idiopathic peripheral neuropathy, right great toe amputation in 2011, lumbar spinal stenosis, left transmetatarsal amputation on 05/30/2020 by Dr. Sharol Given and more recently apparently a slight wound dehiscence. He also had a fracture of his left tibial plateau sometime in 2020. ABI in our clinic was 0.95 8/9-Patient returns after being established in the clinic last time, he has a new open area on the plantar aspect of his right second toe, the right foot plantar ulcer below the great toe on the right looks slightly smaller, using PolyMem. Patient attends to his surgeon for the left foot wound 8/23; this is a patient with a refractory wound over his right first metatarsal head in the setting of severe surrounding hyperkeratosis. I had referred him to dermatology subsequently came to the attention of Dr. Vernona Rieger and Dr. Zigmund Daniel at [facility]. He also had a left TMA by Dr. Sharol Given on the left foot because of osteomyelitis I believe. He is in bilateral surgical shoes. 9/13; refractory wound over the right first metatarsal head with surrounding hyperkeratosis Verrucous skin. He had a left  transmetatarsal amputation by Dr. Sharol Given. We have not been following this however this apparently is still not closed. I have looked over his notes from [facility]. This is well summarized by a note from Dr. Zigmund Daniel on February 24. The patient had had several biopsies of the skin of the first toe metatarsal head. As well as the left great toe which has since been amputated. The pathology results showed Stratus corneum with hyperkeratosis and parakeratosis. An underlying verricous carcinoma could not be excluded. I think the patient was given follow-up but I will actually think that he kept his appointments. He was sent back to follow-up with him with them again in February 2021. Dr. Luetta Nutting who is the plastic surgeon recommended wound care rather than extensive debridement. I am very doubtful that this represents any form of malignancy. This patient has been dealing with this wound for years and I think we would have had a declaration of status well before this if this were any form of malignancy. I also do not believe he has an arterial issue. ABI in the clinic here was 0.95. He has easily palpable peripheral pulses. 10/4; generally gradually better wound area over the right plantar foot first metatarsal head.Marland Kitchen He still has thick surrounding callus around the wound margins and a fibrinous debris over the wound surface although in general a lot of this looks considerably better. He has been following with orthopedics for a left amputation by Dr. Sharol Given. We have not been following this 10/18; first metatarsal head  wound is still open however there is less adherent callus and thick skin around the edge of this. I think this largely has to do with aggressive debridement last time and I am going to repeat this today. He is still dealing with a wound on the left transmetatarsal amputation site this is being followed by Dr. Sharol Given. He has a surgical shoe on the left foot we have not been following this. He is using  crutches apparently reliably to offload both his feet. Electronic Signature(s) Signed: 09/16/2020 4:22:08 PM By: [name] MD Entered By: [name] on 09/16/2020 07:31:16 -------------------------------------------------------------------------------- Physical Exam Details Patient Name: Date of Service: [name], [name] 09/15/2020 10:15 A M Medical Record Number: [id number] Patient Account Number: [id number] Date of Birth/Sex: Treating RN: 1956-08-24 (64 y.o. [name] Primary Care Provider: [name] Other Clinician: Referring Provider: Treating Provider/Extender: [name] in Treatment: 12 Constitutional Patient is hypertensive.. Pulse regular and within target range for patient.Marland Kitchen Respirations regular, non-labored and within target range.. Temperature is normal and within the target range for the patient.Marland Kitchen Appears in no distress. Notes Wound exam; the wound surfaces generally better over the first metatarsal head. He has much better Veraccused debris. Also less callus. I used a #10 scalpel to take the edges down to healthy levels removing callus skin and subcutaneous tissue from around the wound margins. If he offloads this aggressively this might have a chance to epithelialize. I cannot put him in a total contact cast and wrist throwing all the weight back on the left foot. There was no obvious infection Electronic Signature(s) Signed: 09/16/2020 4:22:08 PM By: [name] MD Entered By: [name] on 09/16/2020 07:33:35 -------------------------------------------------------------------------------- Physician Orders Details Patient Name: Date of Service: [name], [name] 09/15/2020 10:15 A M Medical Record Number: [id number] Patient Account Number: [id number] Date of Birth/Sex: Treating RN: 02-28-1956 (64 y.o. [name] Primary Care Provider: [name] Other Clinician: Referring  Provider: Treating Provider/Extender: [name] in Treatment: 12 Verbal / Phone Orders: No Diagnosis Coding ICD-10 Coding Code Description L97.518 Non-pressure chronic ulcer of other part of right foot with other specified severity G90.09 Other idiopathic peripheral autonomic neuropathy Follow-up Appointments Return Appointment in 2 weeks. Dressing Change Frequency Wound #12 Right Metatarsal head first Change Dressing every other day. Skin Barriers/Peri-Wound Care Moisturizing lotion - to feet daily Wound Cleansing Wound #12 Right Metatarsal head first Clean wound with Wound Cleanser - or normal saline Primary Wound Dressing Wound #12 Right Metatarsal head first Polymem Silver Secondary Dressing Wound #12 Right Metatarsal head first Kerlix/Rolled Gauze Dry Gauze Other: - felt callous pad or foam donut Off-Loading Open toe surgical shoe to: - right foot [facility] skilled nursing for wound care. Alvis Lemmings Patient Medications llergies: No Known Drug Allergies A Notifications Medication Indication Start End 09/15/2020 tramadol DOSE 1 - oral 50 mg tablet - 1 tablet oral every 6 hrs as needed for pain Electronic Signature(s) Signed: 09/15/2020 5:42:40 PM By: Levan Hurst RN, BSN Signed: 09/16/2020 4:22:08 PM By: [name] MD Entered By: Levan Hurst on 09/15/2020 11:50:11 Prescription 09/15/2020 -------------------------------------------------------------------------------- Alden Server MD Patient Name: Provider: [birth year] [id number] Date of Birth: NPI#: [name] [id number] Sex: DEA #: [phone] [id number] Phone #: License #: [place] Patient Address: [place] [address] [place], [place] [address] [place], [place] [address] [phone] Allergies No Known  Drug Allergies Medication Note: This prescription was automatically  generated because the medication is a controlled substance, and cannot be prescribed electronically. Medication: Route: Strength: Form: tramadol oral 50 mg tablet Class: OPIOID ANALGESICS Dose: Frequency / Time: Indication: 1 1 tablet oral every 6 hrs as needed for pain Number of Refills: Number of Units: 0 Generic Substitution: Start Date: End Date: Administered at Facility: Substitution Permitted 31/[id number]/8887 No Note to Pharmacy: Hand Signature: Date(s): Electronic Signature(s) Signed: 09/15/2020 5:42:40 PM By: Levan Hurst RN, BSN Signed: 09/16/2020 4:22:08 PM By: [name] MD Entered By: Levan Hurst on 09/15/2020 11:50:12 -------------------------------------------------------------------------------- Problem List Details Patient Name: Date of Service: [name], [name] 09/15/2020 10:15 A M Medical Record Number: [id number] Patient Account Number: [id number] Date of Birth/Sex: Treating RN: 05-19-1956 (64 y.o. [name] Primary Care Provider: [name] Other Clinician: Referring Provider: Treating Provider/Extender: [name] in Treatment: 12 Active Problems ICD-10 Encounter Code Description Active Date MDM Diagnosis L97.518 Non-pressure chronic ulcer of other part of right foot with other specified 06/17/2020 No Yes severity G90.09 Other idiopathic peripheral autonomic neuropathy 06/17/2020 No Yes Inactive Problems Resolved Problems Electronic Signature(s) Signed: 09/16/2020 4:22:08 PM By: [name] MD Previous Signature: 09/15/2020 5:42:40 PM Version By: Levan Hurst RN, BSN Entered By: [name] on 09/16/2020 07:29:41 -------------------------------------------------------------------------------- Progress Note Details Patient Name: Date of Service: [name], [name] 09/15/2020 10:15 A M Medical Record Number: [id number] Patient Account Number: [id number] Date of Birth/Sex: Treating  RN: 1956/09/15 (64 y.o. [name] Primary Care Provider: [name] Other Clinician: Referring Provider: Treating Provider/Extender: [name] in Treatment: 12 Subjective History of Present Illness (HPI) The following HPI elements were documented for the patient's wound: Location: right first metatarsal head on the plantar aspect and the left big toe and the right fifth toe Quality: Patient reports No Pain. Severity: Patient states wound(s) are getting worse. Duration: Patient has had the wound for > 24 months prior to seeking treatment at the wound center Context: The wound would happen gradually Modifying Factors: Wound improving due to current treatment. he has been under podiatry care for over a year and a half Associated Signs and Symptoms: Patient reports having: heaviness in both legs This 64 year old gentleman who has had a long-standing neuropathy of both feet without history of diabetes mellitus has never been worked up for his neurological problem in the last 5 years or so. Most recently he has been seen by the podiatrist Dr. [name] who was been seeing them for about a year and a half and from what I understand has been treating him for bilateral ulceration on the feet, the right being in the region of the first metatarsal head plantar aspect and the left being in the area of the left big toe. He's had various antibiotics including Levaquin recently and this was then switched to Cipro and clindamycin. Due to the nature of this wound which has been there for a long while he has been recently referred to [name] for an opinion. An MRI was done in the middle of June which showed several small abscesses near the amputation site of the right great toe with draining and open wounds with surrounding cellulitis but no evidence of septic arthritis, osteomyelitis or pyomyositis. I understand at this stage he was taken to the operating room by Dr.  Earleen Newport who debrided the wound and cleaned out the abscess. I understand ABIs were done bilaterally and they were  within normal limits at Dr. [name] office. The patient is a smoker and is working on quitting smoking. 07/13/2017 -- had a lower extremity arterial duplex evaluation which showed patent bilateral lower extremity arteries without evidence of hemodynamically significant stenosis. His right ABI was 1.05 and the left was 0.95 He had a lower extremity venous duplex reflux evaluation done which showed significant venous incompetence in the bilateral common femoral, saphenofemoral junction, small saphenous vein and in the right great saphenous vein. With these findings I believe he will benefit from a consultation with the vascular surgeon regarding the endovenous ablation procedure. the patient's neurology appointment and dermatology appointment is still pending. He has had a x-ray done by his PCP and the results are not available. However he did see [name] who I understand has ordered a bilateral MRI of his feet. 07/26/2017 -- was seen by [name] on 07/20/2017. His impression was that he presented with minimal bilateral lower extremity peripheral arterial disease, bilateral lower extremity chronic venous insufficiency (C4) and radiculopathy versus neuropathy bilateral lower extremity. No arterial intervention was needed at the present time. He recommended maximal medical management at this stage for his atherosclerotic disease . He recommended compression to be continued and no venous intervention at the present time. He was offered follow-up in the Vein clinic in 3 months time. MR of the right foot done with and without contrast -- IMPRESSION:Wound on the plantar surface of the foot at approximately the level of first MTP joint with a tiny underlying soft tissue abscess. Large area of soft tissue edema subjacent to the wound is most compatible with granulation tissue. Negative  for osteomyelitis about the first MTP joint. Mild edema and enhancement in the proximal phalanx of the little toe is stable compared to the prior examination and may be due to stress change. Infection is possible but thought highly unlikely. Subcutaneous edema over the dorsum of the foot compatible with dependent change/cellulitis. the patient also has had a abdominal CT which shows a kidney mass and is going to have her MRI for this. He also has a urology opinion pending and a general surgeon's opinion regarding a colonic mass. 08/02/2017 -- his appointments with his other urology and surgical consultants is still later. He has an MRI of the left foot later this week. 08/09/2017 --MR of the left foot with and without contrast -- IMPRESSION:Large appearing skin wound on the medial aspect of the great toe without underlying abscess or osteomyelitis.First MTP osteoarthritis. Marked marrow edema in the medial sesamoid bone is likely related to osteoarthritis but could be due to sesamoiditis. 08/16/2017 -- the patient has had a surgical opinion and a urological opinion and a extensive surgery including possible colon resection, bladder surgery and kidney surgery is being planned. No date has been set for his procedure He continues to be off smoking 08/31/2017 -- he was seen by the neurologist for his multiple problems of neuropathic and radicular symptoms in both legs with a thorough workup done. MRIs were reviewed lab work was reviewed and the assessment was that he had severe neuropathy due to multifactorial reasons and a neuropathy panel of lab work was ordered and he would be reviewed back. Patient is also due for colonoscopy to be repeated before his bladder and kidney surgery. At the present time he would like [name] to continue with conservative and symptomatic wound care for both his feet. 10/05/2017 -- since I have seen him last time he has had a colonoscopy which was  fairly normal except for a  polyp and he has a urology appointment pending later today. He still continues to stay off smoking. 10/26/2017-- he returns after about 3 weeks and in the meanwhile he was seen by Dr. Kellie Simmering, who reviewed his venous studies and noted that there was very minimal enlargement of the bilateral great saphenous vein on the right and somewhat larger vein on the left but no consistent reflux throughout the great saphenous veins and there was also some right small saphenous veins which were enlarged with some reflux. However overall he thought that venous insufficiency was not significantly the cause of his ulceration and it was more of trophic ulcers and hence he recommended aggressive wound care, and at some stage he may need amputation of the left first toe and the right fifth toe. 11/09/2017 -- due to the inclement weather he has missed his urology appointment and is still awaiting the rescheduled one. Other than that he's been doing fairly well. 11/23/2017 -- he has had his cystoscopy done by his urologist and they found multiple areas of concern and he is going to be set up for a procedure sometime later in January. He is on complex care as far as his wounds go and seizures every 2-3 weeks 12/07/17 patient presents today for evaluation concerning his right great toe amputation site as well as his left great toe ulcer. He has been tolerating the dressing changes fairly well. With that being said there does not appear to be any significant discomfort at this point that he is experiencing although he has quite significant callous especially in regard to the amputation site of the right great toe. Nonetheless he has no evidence of infection which is good news No fevers, chills, nausea, or vomiting noted at this time. He has no pain. READMISSION 05/09/18; is a patient who hasn't been here in quite some time. He is not a diabetic but he has severe idiopathic peripheral neuropathy. He was here for review of a  nonhealing wound on the right great toe amputation site and a large portion of the tip of the left great toe. Sometime after his last visit here he became ill he had abdominal issues infections and required hospitalizations. He largely dropped off the map. He is putting silver alginate on the wounds. I note that he had arterial studies and saw Dr. Bridgett Larsson of vascular surgery was not felt to have an arterial issue. He also had reflux studies and he wears compression stockings and he is faithful with these. He had MRI of both of his feet that not show osteomyelitis question small abscess on the right. He tells me that the nonhealing wound on his right great toe amputation site is been there since the actual amputation which was in 2014 I note the area was aggressively debrided by Dr. Jacqualyn Posey of podiatry before he started coming to this clinic apparently there were abscess is present at that time. His last formal arterial studies were done in August 2018 which time his ABI was 1.05 on the right and 0.95 on the left he had biphasic and triphasic waveforms on the left and monophasic and biphasic waveforms on the right. He did not have TBI's. ABIs in our clinic today were 0.8 bilaterally. The patient states he is not a diabetic. He does have idiopathic peripheral neuropathy. He is a smoker but is trying to quit with Chantix. 05/16/18; x-rays of his bilateral feet were negative for osteomyelitis. He was noted to have a chronic  deformity of the head of the second metatarsal which may be due to previous osteonecrosis there was no definite underlying bone destruction to suggest osteomyelitis on either side. He came in with the extensive callus over his right great toe amputation site and the left great toe did extensive debridement last week and another extensive debridement today.patient is changing his dressing himself with silver alginate 05/30/18; still buildup of thick callus covering thick subcutaneous tissue  around both of these areas. This requires an extensive debridement on both sides. I have discussed offloading this area with the patient. I think he inverts when he walks. His foot sizes beyond what we can put in a Darco forefoot off loader or probably what we can put in a total contact cast. 06/13/18; still a remarkable buildup of callus over the right first toe amputation site. The left first toe has 2 open wounds one medially and 1 at the tip of the toe. Surrounding callus is also not back here. We've been using silver alginate. He has a darco forefoot off loader now on the right foot.his own shoe on the left foot. Would like to try a total contact cast on the right. He is making arrangements for someone to drive him here in 2 weeks. 06/29/18; still a buildup of callus over the right first toe amputation site although not as significant as previously. The left first toe has 2 open wounds one medially and one at the tip surrounding callus as well. We've been using silver alginate. He comes in today with the right for a total contact cast which I'll use on the right 07/04/18 on evaluation today patient appears to be doing decently well in regard to the cast. Unfortunately he did have a little area that rubbed on the lateral aspect of his leg the cast actually cracked on the medial aspect this happened he states just yesterday. He has not been walking in the cast without the boot portion on. With that being said he states that he is unsure of exactly what happened with the crack. Nonetheless this does seem to have caused a little bit of rubbing on the lateral aspect I think due to the integrity of the cast being compromised. Nonetheless I advised him that if this happens in the future he needs to let us know soon as possible. Nonetheless I do feel like the cast has been of benefit some of the area on the distal portion of his great toe amputation site actually appears to be doing better. 07/12/18 on  evaluation today patient appears to be doing very well in regard to his right great toe invitation site. This seems to be showing signs of excellent improvement which is great news. There does not appear to be any evidence of infection at this time. With that being said he has been tolerating the dressing changes without complication. There appears to be no evidence of infection and again this is barely open at this point. The total contact cast has been of excellent benefit for him. I believe this will likely be completely closed and ready next week that we can switch to a cast on the left foot. He states he may want to take a week's break. 07/19/18 on evaluation today patient appears to be doing better in regard to both ulceration areas. The right foot actually appears to be completely healed he still has some callous but no open wound. The left foot/great toe actually seems to be better after last week's debridement overall  the appearance of the wound is greatly improved. 08/09/18 on evaluation today patient actually appears to be doing a little bit worse in regard to the right metatarsal invitation site. He's been tolerating the dressing changes without complication. With that being said he does note that the area actually cracked open as of today when he was in the shower trying to scrub it. Nonetheless it does appear that the Caryn Section that previously was open and draining has subsequently reopened and is draining again. Nonetheless as I was looking at him and performing the debridement today I did inquire about whether or not we had ever biopsy the area as I was concerned about the possibility for something along the lines of a work type virus/infection causing the excessive callous buildup. Especially since he seems to grow much more than what would just be traditionally expected by friction alone. He subsequently told me that he did have this biopsied somewhere around 1-2 years ago by Dr. Jacqualyn Posey  and that it showed that he had a wart infection at that point. Nonetheless he was never referred to dermatology better Jacqualyn Posey attempted to cut it out according to the patient. Nonetheless that may be part of the big issue with what we're dealing with here and without treating the wart infection we may not actually get this to heal and stay closed. READMISSION 12/07/2018 The patient is back in clinic today essentially with wounds in the same area and condition is that I think the last 2 times he has been here. This includes the right first metatarsal head in the setting of a previous right great toe amputation and a large part of the medial part of his left great toe. The patient states he has been dealing with this for years dating back to 2005 on and off. That he is never found this to be totally healed. When he was last in this clinic he had this biopsied shave biopsies which showed hyperkeratotic and parakeratotic debris. Epithelium with papillary features negative for malignancy. He has been using silver alginate. He has healing sandals which he uses Felt on the bottom of these. When he was here last time we put him a total contact cast at least for a while and the patient states on the right that helped with the buildup of hyperkeratotic tissue and that seems to be reflective in our notes. He also states that was previously biopsied by Dr. Jacqualyn Posey of podiatry. The patient has a severe idiopathic peripheral neuropathy which he attributes to a brown recluse spider bite in 2002 or so. He is not a diabetic The patient had arterial studies in this showed an ABI in the right of 1.05 on the left was 0.95. TBI's were not done. He had monophasic waveforms at the right posterior tibial biphasic at the dorsalis pedis. On the left he was biphasic at the posterior tibial and triphasic at the dorsalis pedis. 1/17; this is a patient with a very challenging issue was readmitted to the clinic last week. He has a  large area over the first right MTP in the setting of a previous right great toe amputation. On presentation this is covered with copious amounts of hyperkeratotic thick callus nonviable tissue. When this is debridement both on this occasion and previous occasions in this clinic he has a very boggy presentation to the subcutaneous tissue which looks somewhat atypical. He has a similar area on the left great toe from the tip of the toe down the medial aspect beyond the  interphalangeal joint. He's had previous MRIs of both of his feet. On the right that showed small abscesses and he was taken to the OR by podiatry for debridement and cleaning out of the abscesses. An MRI did not show osteomyelitis of the left first toe. Both the MRIs were done in 2018. He saw Dr. Vallarie Mare at the time who did not think he had significant macrovascular disease. He is not a diabetic but he has idiopathic peripheral neuropathy and was a significant smoker. When he was in the clinic earlier in 2019 he had biopsies done of both areas that showed hyperkeratotic material but no malignancy. X-rays I ordered last week did not show osteomyelitis on the left but did suggest osteomyelitis at the tip of the left great toe. I looked at the x-rays myself this is a subtle finding it is only seen on the lateral film. I did a very significant debridement on him last week bilaterally in both the wound areas look better. When he was previously in our clinic the area on the right did improve with a total contact cast. He dropped out of the schedule at that point I'm not exactly sure why 1/24; the biopsy I did of the area over his right first metatarsal head showed the possibility of verruca vulgaris. Superimposed changes of lichen simplex chronicus. If this is a plantar wart this would be extremely large. I think he would need topical destructive therapy and I am going to refer him to dermatology. There was no suggestion of malignancy. Previous  biopsies in this clinic on both his areas did not show evidence of malignancy either. Until we get dermatology I am going to continue with silver alginate. X-rays suggested osteomyelitis of the left toe I am going to continue him on trimethoprim sulfamethoxazole 2/3; not much change in this area on the right first met head. Extensive dark eschar formation around the central area of nonviable subcutaneous tissue looking like a boggy subcutaneous tissue. Using a #10 scalpel and pickups I removed all of this. I have tried this before and this man but I plan to put him in a total contact cast next week ooUsing a #5 curette debridement of the left great toe ooHe has an appointment with dermatology at [facility] on the 25th 2/11; right first metatarsal head after last week's extensive debridement is right back to the way it was with a nonviable subcutaneous tissue and thick surrounding eschar. The left great toe looks about the same. The patient could not have a total contact cast today because he had household problems [leaking roof] but I am not going to put a cast on him until I have information from the dermatologist about this. READMISSION 06/17/2020 Mr. Bonura is now a 64 year old man that we have had in this clinic on at least 2 occasions previously. When he was last here in February 2020 I referred him to dermatology at [facility] for their review of large wounds on the right first metatarsal head with surrounding severe chronic hyperkeratosis. This was biopsied on at least 2 occasions in our clinic and I think at [facility] as well that did not show an underlying malignancy. I have reviewed the consult from Dr. Sharlette Dense dermatology from 01/23/2019. At that time the patient had wounds on the plantar right first metatarsal head as well as the plantar left great toe. From what he is saying the left great toe healed however he recently had to have a left TMA by Dr. Sharol Given because of osteomyelitis  in the forefoot we  did not actually get a look at his foot today. Dermatology at [facility] apparently referred him to Dr. Luetta Nutting of plastic surgery and an operative excision of this area was planned however apparently they ran into the pandemic. The procedure was repetitively cancel and the patient did not keep his final appointment which I think was earlier this year he was also seen in February of this year at the wound care center in [facility] by Dr. Zigmund Daniel. They were applying Silvadene cream and Xeroform which she is essentially doing now. I see in his notes that there was still some concern about underlying malignancy although would be difficult to believe as long as this is been there that there would not be more extensive damage at this point in any case he is back in our clinic for review of this. He had an x-ray of the foot on 01/23/2020 that was negative for osteomyelitis acute fracture. Past medical history; idiopathic peripheral neuropathy, right great toe amputation in 2011, lumbar spinal stenosis, left transmetatarsal amputation on 05/30/2020 by Dr. Sharol Given and more recently apparently a slight wound dehiscence. He also had a fracture of his left tibial plateau sometime in 2020. ABI in our clinic was 0.95 8/9-Patient returns after being established in the clinic last time, he has a new open area on the plantar aspect of his right second toe, the right foot plantar ulcer below the great toe on the right looks slightly smaller, using PolyMem. Patient attends to his surgeon for the left foot wound 8/23; this is a patient with a refractory wound over his right first metatarsal head in the setting of severe surrounding hyperkeratosis. I had referred him to dermatology subsequently came to the attention of Dr. Vernona Rieger and Dr. Zigmund Daniel at [facility]. He also had a left TMA by Dr. Sharol Given on the left foot because of osteomyelitis I believe. He is in bilateral surgical shoes. 9/13; refractory wound over the right first metatarsal head  with surrounding hyperkeratosis Verrucous skin. He had a left transmetatarsal amputation by Dr. Sharol Given. We have not been following this however this apparently is still not closed. I have looked over his notes from [facility]. This is well summarized by a note from Dr. Zigmund Daniel on February 24. The patient had had several biopsies of the skin of the first toe metatarsal head. As well as the left great toe which has since been amputated. The pathology results showed Stratus corneum with hyperkeratosis and parakeratosis. An underlying verricous carcinoma could not be excluded. I think the patient was given follow-up but I will actually think that he kept his appointments. He was sent back to follow-up with him with them again in February 2021. Dr. Luetta Nutting who is the plastic surgeon recommended wound care rather than extensive debridement. I am very doubtful that this represents any form of malignancy. This patient has been dealing with this wound for years and I think we would have had a declaration of status well before this if this were any form of malignancy. I also do not believe he has an arterial issue. ABI in the clinic here was 0.95. He has easily palpable peripheral pulses. 10/4; generally gradually better wound area over the right plantar foot first metatarsal head.Marland Kitchen He still has thick surrounding callus around the wound margins and a fibrinous debris over the wound surface although in general a lot of this looks considerably better. He has been following with orthopedics for a left amputation by Dr. Sharol Given. We have  not been following this 10/18; first metatarsal head wound is still open however there is less adherent callus and thick skin around the edge of this. I think this largely has to do with aggressive debridement last time and I am going to repeat this today. He is still dealing with a wound on the left transmetatarsal amputation site this is being followed by Dr. Sharol Given. He has a surgical shoe on  the left foot we have not been following this. He is using crutches apparently reliably to offload both his feet. Objective Constitutional Patient is hypertensive.. Pulse regular and within target range for patient.Marland Kitchen Respirations regular, non-labored and within target range.. Temperature is normal and within the target range for the patient.Marland Kitchen Appears in no distress. Vitals Time Taken: 10:56 AM, Height: 80 in, Source: Stated, Weight: 350 lbs, Source: Stated, BMI: 38.4, Temperature: 98.0 F, Pulse: 76 bpm, Respiratory Rate: 20 breaths/min, Blood Pressure: 151/84 mmHg. General Notes: Wound exam; the wound surfaces generally better over the first metatarsal head. He has much better Veraccused debris. Also less callus. I used a #10 scalpel to take the edges down to healthy levels removing callus skin and subcutaneous tissue from around the wound margins. If he offloads this aggressively this might have a chance to epithelialize. I cannot put him in a total contact cast and wrist throwing all the weight back on the left foot. There was no obvious infection Integumentary (Hair, Skin) Wound #12 status is Open. Original cause of wound was Gradually Appeared. The wound is located on the Right Metatarsal head first. The wound measures 2.3cm length x 2cm width x 0.4cm depth; 3.613cm^2 area and 1.445cm^3 volume. There is Fat Layer (Subcutaneous Tissue) exposed. There is no tunneling or undermining noted. There is a medium amount of serosanguineous drainage noted. The wound margin is thickened. There is large (67-100%) red granulation within the wound bed. There is no necrotic tissue within the wound bed. Assessment Active Problems ICD-10 Non-pressure chronic ulcer of other part of right foot with other specified severity Other idiopathic peripheral autonomic neuropathy Procedures Wound #12 Pre-procedure diagnosis of Wound #12 is a Neuropathic Ulcer-Non Diabetic located on the Right Metatarsal head first .  There was a Excisional Skin/Subcutaneous Tissue Debridement with a total area of 4.6 sq cm performed by [name]., MD. With the following instrument(s): Blade to remove Viable and Non-Viable tissue/material. Material removed includes Callus and Subcutaneous Tissue and. No specimens were taken. A time out was conducted at 11:22, prior to the start of the procedure. A Moderate amount of bleeding was controlled with Pressure. The procedure was tolerated well with a pain level of 0 throughout and a pain level of 0 following the procedure. Post Debridement Measurements: 2.3cm length x 2cm width x 0.4cm depth; 1.445cm^3 volume. Character of Wound/Ulcer Post Debridement is improved. Post procedure Diagnosis Wound #12: Same as Pre-Procedure Plan Follow-up Appointments: Return Appointment in 2 weeks. Dressing Change Frequency: Wound #12 Right Metatarsal head first: Change Dressing every other day. Skin Barriers/Peri-Wound Care: Moisturizing lotion - to feet daily Wound Cleansing: Wound #12 Right Metatarsal head first: Clean wound with Wound Cleanser - or normal saline Primary Wound Dressing: Wound #12 Right Metatarsal head first: Polymem Silver Secondary Dressing: Wound #12 Right Metatarsal head first: Kerlix/Rolled Gauze Dry Gauze Other: - felt callous pad or foam donut Off-Loading: Open toe surgical shoe to: - right foot Home Health: [facility] for wound care. Alvis Lemmings The following medication(s) was prescribed: tramadol oral 50 mg  tablet 1 1 tablet oral every 6 hrs as needed for pain starting 09/15/2020 1. I am continue with polymen silver over this area 2. Dry gauze Curlex 3. He has home health/[facility] 4. He apparently still has an open wound on the left foot I am not comfortable putting a total contact cast on the right foot [the 1 we are following] until I am more clear on the left foot status. Previously he did well with this 5. He has had multiple  biopsies of this area looking for underlying squamous cell carcinoma I believe both in this clinic and when he was followed at [facility]. There was no malignancy here 6. He says that he finds these debridements painful for the next 24 to 48 hours I have given him tramadol as needed Electronic Signature(s) Signed: 09/16/2020 4:22:08 PM By: [name] MD Entered By: [name] on 09/16/2020 07:35:56 -------------------------------------------------------------------------------- SuperBill Details Patient Name: Date of Service: [name], [name] 09/15/2020 Medical Record Number: [id number] Patient Account Number: [id number] Date of Birth/Sex: Treating RN: [birth year] (64 y.o. [name] Primary Care Provider: [name] Other Clinician: Referring Provider: Treating Provider/Extender: [name] in Treatment: 12 Diagnosis Coding ICD-10 Codes Code Description [phone] Non-pressure chronic ulcer of other part of right foot with other specified severity G90.09 Other idiopathic peripheral autonomic neuropathy Facility Procedures CPT4 Code: [id number] Description: 83291 - DEB SUBQ TISSUE 20 SQ CM/< ICD-10 Diagnosis Description L97.518 Non-pressure chronic ulcer of other part of right foot with other specified sev G90.09 Other idiopathic peripheral autonomic neuropathy Modifier: erity Quantity: 1 Physician Procedures : CPT4 Code Description Modifier [id number] - WC PHYS SUBQ TISS 20 SQ CM ICD-10 Diagnosis Description L97.518 Non-pressure chronic ulcer of other part of right foot with other specified severity G90.09 Other idiopathic peripheral autonomic neuropathy Quantity: 1 Electronic Signature(s) Signed: 09/16/2020 4:22:08 PM By: [name] MD Entered By: [name] on 09/16/2020 07:36:05

## 2020-09-18 ENCOUNTER — Encounter: Payer: Self-pay | Admitting: Orthopedic Surgery

## 2020-09-18 ENCOUNTER — Ambulatory Visit (INDEPENDENT_AMBULATORY_CARE_PROVIDER_SITE_OTHER): Payer: Medicare PPO | Admitting: Orthopedic Surgery

## 2020-09-18 VITALS — Ht >= 80 in | Wt 369.0 lb

## 2020-09-18 DIAGNOSIS — L97521 Non-pressure chronic ulcer of other part of left foot limited to breakdown of skin: Secondary | ICD-10-CM | POA: Diagnosis not present

## 2020-09-18 DIAGNOSIS — Z89432 Acquired absence of left foot: Secondary | ICD-10-CM | POA: Diagnosis not present

## 2020-09-18 NOTE — Progress Notes (Signed)
Office Visit Note   Patient: [name].           Date of Birth: [birth year]           MRN: [id number] Visit Date: 09/18/2020              Requested by: [name], MD [address] [place],  [place] [address] PCP: [name], MD  Chief Complaint  Patient presents with   Left Foot - Follow-up    05/30/20 left transmet amputation       HPI: Patient is a 64 year old gentleman who was over 3 months out from a left transmetatarsal amputation.  Patient states has been using Silvadene dressing changes he states he has noticed an odor he states he feels better after he has gotten back on his crutches and postoperative shoe he states he cannot see the wound and does not know how it is doing.  Assessment & Plan: Visit Diagnoses:  1. History of transmetatarsal amputation of left foot ([place])   2. Non-pressure chronic ulcer of other part of left foot limited to breakdown of skin ([place])     Plan: Recommended elevation to decrease swelling decreased weightbearing dressing change daily recommend resume the nitroglycerin patch and the Trental.  Follow-Up Instructions: Return in about 2 weeks (around 10/02/2020).   Ortho Exam  Patient is alert, oriented, no adenopathy, well-dressed, normal affect, normal respiratory effort. Examination patient has a breakdown of the transmetatarsal amputation medially left foot.  The area of nonviable tissue was approximately 1 cm diameter.  He does have venous stasis swelling with pitting edema in the foot.  After informed consent a 10 blade knife was used to debride the nonviable tissue with excision of skin and soft tissue after debridement the ulcer is 4 cm in diameter 5 mm deep with healthy granulation tissue there is no exposed bone or tendon no tunneling.  Imaging: No results found. No images are attached to the encounter.  Labs: Lab Results  Component Value Date   HGBA1C 6.0 (H) 08/23/2017   HGBA1C 5.3 03/12/2014   HGBA1C 5.4  07/04/2012   ESRSEDRATE 14 06/14/2017   ESRSEDRATE 30 05/17/2017   ESRSEDRATE 23 (H) 01/31/2015   CRP 23.4 (H) 06/14/2017   CRP 37.4 (H) 05/17/2017   CRP 55.8 (H) 03/29/2017   REPTSTATUS 09/17/2010 FINAL 08/03/2010   GRAMSTAIN  08/02/2010    FEW WBC PRESENT, PREDOMINANTLY PMN NO SQUAMOUS EPITHELIAL CELLS SEEN RARE GRAM NEGATIVE RODS   CULT NO ACID FAST BACILLI ISOLATED IN 6 WEEKS 08/03/2010     Lab Results  Component Value Date   ALBUMIN 4.0 03/12/2014   ALBUMIN 3.0 (L) 07/04/2012   ALBUMIN 1.7 (L) 08/02/2010   PREALBUMIN 4.3 (L) 08/02/2010    Lab Results  Component Value Date   MG 2.4 07/04/2012   MG 2.2 08/01/2010   No results found for: VD25OH  Lab Results  Component Value Date   PREALBUMIN 4.3 (L) 08/02/2010   CBC EXTENDED Latest Ref Rng & Units 05/30/2020 01/02/2018 01/01/2018  WBC 4.0 - 10.5 K/uL 9.0 12.4(H) 15.1(H)  RBC 4.22 - 5.81 MIL/uL 4.67 4.16(L) 4.09(L)  HGB 13.0 - 17.0 g/dL 13.6 12.5(L) 12.4(L)  HCT 39 - 52 % 43.3 38.4(L) 37.6(L)  PLT 150 - 400 K/uL 445(H) 223 232  NEUTROABS 1.40 - 7.00 x10E3/uL - - -  LYMPHSABS 0 - 3 x10E3/uL - - -     Body mass index is 40.54 kg/m.  Orders:  No orders of  the defined types were placed in this encounter.  No orders of the defined types were placed in this encounter.    Procedures: No procedures performed  Clinical Data: No additional findings.  ROS:  All other systems negative, except as noted in the HPI. Review of Systems  Objective: Vital Signs: Ht 6\' 8"  (2.032 m)    Wt (!) 369 lb (167.4 kg)    BMI 40.54 kg/m   Specialty Comments:  No specialty comments available.  PMFS History: Patient Active Problem List   Diagnosis Date Noted   Abscess of left foot 05/30/2020   Subacute osteomyelitis, left ankle and foot ([place])    Skin ulcers of both feet ([place]) 10/24/2017   Colovesical fistula    Benign neoplasm of cecum    Chronic venous insufficiency 07/20/2017   Chronic left-sided low back pain  without sciatica 11/08/2016   Toe ulcer ([place]) 03/08/2016   Verruca 12/28/2015   Pre-ulcerative calluses 12/28/2015   History of adenomatous polyp of colon 06/09/2015   Lipoma 03/13/2014   Plantar warts 07/09/2012   Cellulitis 07/04/2012   Neuropathy 07/04/2012   Hyperglycemia 07/04/2012   HYPERTENSION, BENIGN 10/20/2010   EDEMA 08/20/2010   WISDOM TEETH EXTRACTION, HX OF 08/20/2010   HYPOALBUMINEMIA 08/19/2010   ANEMIA 08/19/2010   NICOTINE ADDICTION 08/19/2010   NECROTIZING PNEUMONIA 08/19/2010   OSTEOARTHRITIS 08/19/2010   Past Medical History:  Diagnosis Date   Arthritis    Chronic kidney disease    mass r kidney - partial nephrectomy   COPD (chronic obstructive pulmonary disease) ([place])    Diverticulitis    History of kidney stones    Hx of adenomatous colonic polyps 06/09/2015   Hyperlipidemia    Hypertension    has previously been on medication but then taken off - 05/29/20 pt states he's never been treated for HTN   Neuromuscular disorder (HCC)    neuropathy lower legs and feet   Neuropathy    in both feet   Pneumonia 2011    Family History  Problem Relation Age of Onset   Heart disease Mother    Esophageal cancer Mother    Diabetes Father    Pancreatic cancer Father    Breast cancer Sister    Colon cancer Neg Hx    Rectal cancer Neg Hx    Stomach cancer Neg Hx    Colon polyps Neg Hx     Past Surgical History:  Procedure Laterality Date   AMPUTATION Bilateral 06/05/2013   Procedure: RIGHT GREAT TOE AMPUTATION/LEFT GREAT TOE DEBRIDEMENT;  Surgeon: Wylene Simmer, MD;  Location: [facility];  Service: Orthopedics;  Laterality: Bilateral;   AMPUTATION Left 05/30/2020   Procedure: LEFT TRANSMETATARSAL AMPUTATION;  Surgeon: Newt Minion, MD;  Location: [facility];  Service: Orthopedics;  Laterality: Left;   APPLICATION OF WOUND VAC Left 05/30/2020   Procedure: APPLICATION OF WOUND VAC;  Surgeon: Newt Minion, MD;  Location: [facility];   Service: Orthopedics;  Laterality: Left;   COLON SURGERY     robotic partial colectomy Dr. Marcello Moores 12-30-17   COLONOSCOPY     x 2 last date 09/21/2017 bladder attached to colon eval   COLONOSCOPY WITH PROPOFOL N/A 09/21/2017   Procedure: COLONOSCOPY WITH PROPOFOL;  Surgeon: [name], MD;  Location: [facility];  Service: Endoscopy;  Laterality: N/A;   MOUTH SURGERY     PARTIAL NEPHRECTOMY     right Dr. Leighton Ruff [birth year]   ROBOTIC ASSITED PARTIAL NEPHRECTOMY Right 12/30/2017   Procedure: XI  ROBOTIC ASSITED RIGHT PARTIAL NEPHRECTOMY;  Surgeon: Alexis Frock, MD;  Location: [facility];  Service: Urology;  Laterality: Right;   TOE AMPUTATION Right 06/05/2013   RIGHT GREAT TOE PARTIAL AMPUTATION     TOOTH EXTRACTION  2011   multiple teeth extracted   Social History   Occupational History   Occupation: retired  Tobacco Use   Smoking status: Current Some Day Smoker    Packs/day: 0.10    Years: 20.00    Pack years: 2.00    Types: Cigarettes    Start date: 11/29/1973   Smokeless tobacco: Never Used   Tobacco comment: occasional smoker - ~1 pack/week. smoked ~1.5ppd until 5 yr ago  Vaping Use   Vaping Use: Never used  Substance and Sexual Activity   Alcohol use: No    Alcohol/week: 0.0 standard drinks   Drug use: No   Sexual activity: Not Currently    Partners: Female

## 2020-09-19 ENCOUNTER — Telehealth: Payer: Self-pay | Admitting: Orthopedic Surgery

## 2020-09-19 DIAGNOSIS — N189 Chronic kidney disease, unspecified: Secondary | ICD-10-CM | POA: Diagnosis not present

## 2020-09-19 DIAGNOSIS — G9009 Other idiopathic peripheral autonomic neuropathy: Secondary | ICD-10-CM | POA: Diagnosis not present

## 2020-09-19 DIAGNOSIS — L97512 Non-pressure chronic ulcer of other part of right foot with fat layer exposed: Secondary | ICD-10-CM | POA: Diagnosis not present

## 2020-09-19 DIAGNOSIS — T8781 Dehiscence of amputation stump: Secondary | ICD-10-CM | POA: Diagnosis not present

## 2020-09-19 DIAGNOSIS — T8744 Infection of amputation stump, left lower extremity: Secondary | ICD-10-CM | POA: Diagnosis not present

## 2020-09-19 DIAGNOSIS — I129 Hypertensive chronic kidney disease with stage 1 through stage 4 chronic kidney disease, or unspecified chronic kidney disease: Secondary | ICD-10-CM | POA: Diagnosis not present

## 2020-09-19 DIAGNOSIS — M86672 Other chronic osteomyelitis, left ankle and foot: Secondary | ICD-10-CM | POA: Diagnosis not present

## 2020-09-19 DIAGNOSIS — J449 Chronic obstructive pulmonary disease, unspecified: Secondary | ICD-10-CM | POA: Diagnosis not present

## 2020-09-19 DIAGNOSIS — L97526 Non-pressure chronic ulcer of other part of left foot with bone involvement without evidence of necrosis: Secondary | ICD-10-CM | POA: Diagnosis not present

## 2020-09-19 NOTE — Telephone Encounter (Signed)
Called and gave verbal ok for recert of services.

## 2020-09-19 NOTE — Telephone Encounter (Signed)
Jignaben from [facility] called. Patient's visits will run out soon. Would like orders to do a re certification. Her call back number is [phone]

## 2020-09-26 DIAGNOSIS — G9009 Other idiopathic peripheral autonomic neuropathy: Secondary | ICD-10-CM | POA: Diagnosis not present

## 2020-09-26 DIAGNOSIS — L97526 Non-pressure chronic ulcer of other part of left foot with bone involvement without evidence of necrosis: Secondary | ICD-10-CM | POA: Diagnosis not present

## 2020-09-26 DIAGNOSIS — T8781 Dehiscence of amputation stump: Secondary | ICD-10-CM | POA: Diagnosis not present

## 2020-09-26 DIAGNOSIS — T8744 Infection of amputation stump, left lower extremity: Secondary | ICD-10-CM | POA: Diagnosis not present

## 2020-09-26 DIAGNOSIS — I129 Hypertensive chronic kidney disease with stage 1 through stage 4 chronic kidney disease, or unspecified chronic kidney disease: Secondary | ICD-10-CM | POA: Diagnosis not present

## 2020-09-26 DIAGNOSIS — L97512 Non-pressure chronic ulcer of other part of right foot with fat layer exposed: Secondary | ICD-10-CM | POA: Diagnosis not present

## 2020-09-26 DIAGNOSIS — N189 Chronic kidney disease, unspecified: Secondary | ICD-10-CM | POA: Diagnosis not present

## 2020-09-26 DIAGNOSIS — J449 Chronic obstructive pulmonary disease, unspecified: Secondary | ICD-10-CM | POA: Diagnosis not present

## 2020-09-26 DIAGNOSIS — M86672 Other chronic osteomyelitis, left ankle and foot: Secondary | ICD-10-CM | POA: Diagnosis not present

## 2020-09-29 ENCOUNTER — Other Ambulatory Visit: Payer: Self-pay

## 2020-09-29 ENCOUNTER — Encounter (HOSPITAL_BASED_OUTPATIENT_CLINIC_OR_DEPARTMENT_OTHER): Payer: Medicare PPO | Attending: Internal Medicine | Admitting: Internal Medicine

## 2020-09-29 DIAGNOSIS — G9009 Other idiopathic peripheral autonomic neuropathy: Secondary | ICD-10-CM | POA: Insufficient documentation

## 2020-09-29 DIAGNOSIS — L97518 Non-pressure chronic ulcer of other part of right foot with other specified severity: Secondary | ICD-10-CM | POA: Diagnosis not present

## 2020-09-29 DIAGNOSIS — Z89411 Acquired absence of right great toe: Secondary | ICD-10-CM | POA: Diagnosis not present

## 2020-09-29 NOTE — Progress Notes (Signed)
HERCULES, HASLER ([id number]) Visit Report for 09/29/2020 HPI Details Patient Name: Date of Service: [name], [name]. 09/29/2020 10:15 A M Medical Record Number: [id number] Patient Account Number: [id number] Date of Birth/Sex: Treating RN: [birth year] (64 y.o. [name] Primary Care Provider: [name] Other Clinician: Referring Provider: Treating Provider/Extender: [name] in Treatment: 14 History of Present Illness Location: right first metatarsal head on the plantar aspect and the left big toe and the right fifth toe Quality: Patient reports No Pain. Severity: Patient states wound(s) are getting worse. Duration: Patient has had the wound for > 24 months prior to seeking treatment at the wound center Context: The wound would happen gradually Modifying Factors: Wound improving due to current treatment. he has been under podiatry care for over a year and a half ssociated Signs and Symptoms: Patient reports having: heaviness in both legs A HPI Description: This 64 year old gentleman who has had a long-standing neuropathy of both feet without history of diabetes mellitus has never been worked up for his neurological problem in the last 5 years or so. Most recently he has been seen by the podiatrist Dr. [name] who was been seeing them for about a year and a half and from what I understand has been treating him for bilateral ulceration on the feet, the right being in the region of the first metatarsal head plantar aspect and the left being in the area of the left big toe. He's had various antibiotics including Levaquin recently and this was then switched to Cipro and clindamycin. Due to the nature of this wound which has been there for a long while he has been recently referred to [name] for an opinion. An MRI was done in the middle of June which showed several small abscesses near the amputation site of the right great toe with draining and open  wounds with surrounding cellulitis but no evidence of septic arthritis, osteomyelitis or pyomyositis. I understand at this stage he was taken to the operating room by [name] who debrided the wound and cleaned out the abscess. I understand ABIs were done bilaterally and they were within normal limits at Dr. [name] office. The patient is a smoker and is working on quitting smoking. 07/13/2017 -- had a lower extremity arterial duplex evaluation which showed patent bilateral lower extremity arteries without evidence of hemodynamically significant stenosis. His right ABI was 1.05 and the left was 0.95 He had a lower extremity venous duplex reflux evaluation done which showed significant venous incompetence in the bilateral common femoral, saphenofemoral junction, small saphenous vein and in the right great saphenous vein. With these findings I believe he will benefit from a consultation with the vascular surgeon regarding the endovenous ablation procedure. the patient's neurology appointment and dermatology appointment is still pending. He has had a x-ray done by his PCP and the results are not available. However he did see [name] who I understand has ordered a bilateral MRI of his feet. 07/26/2017 -- was seen by [name] on 07/20/2017. His impression was that he presented with minimal bilateral lower extremity peripheral arterial disease, bilateral lower extremity chronic venous insufficiency (C4) and radiculopathy versus neuropathy bilateral lower extremity. No arterial intervention was needed at the present time. He recommended maximal medical management at this stage for his atherosclerotic disease . He recommended compression to be continued and no venous intervention at the present time. He was offered follow-up in the Vein clinic in 3 months time. MR  of the right foot done with and without contrast -- IMPRESSION:Wound on the plantar surface of the foot at approximately the level of  first MTP joint with a tiny underlying soft tissue abscess. Large area of soft tissue edema subjacent to the wound is most compatible with granulation tissue. Negative for osteomyelitis about the first MTP joint. Mild edema and enhancement in the proximal phalanx of the little toe is stable compared to the prior examination and may be due to stress change. Infection is possible but thought highly unlikely. Subcutaneous edema over the dorsum of the foot compatible with dependent change/cellulitis. the patient also has had a abdominal CT which shows a kidney mass and is going to have her MRI for this. He also has a urology opinion pending and a general surgeon's opinion regarding a colonic mass. 08/02/2017 -- his appointments with his other urology and surgical consultants is still later. He has an MRI of the left foot later this week. 08/09/2017 --MR of the left foot with and without contrast -- IMPRESSION:Large appearing skin wound on the medial aspect of the great toe without underlying abscess or osteomyelitis.First MTP osteoarthritis. Marked marrow edema in the medial sesamoid bone is likely related to osteoarthritis but could be due to sesamoiditis. 08/16/2017 -- the patient has had a surgical opinion and a urological opinion and a extensive surgery including possible colon resection, bladder surgery and kidney surgery is being planned. No date has been set for his procedure He continues to be off smoking 08/31/2017 -- he was seen by the neurologist for his multiple problems of neuropathic and radicular symptoms in both legs with a thorough workup done. MRIs were reviewed lab work was reviewed and the assessment was that he had severe neuropathy due to multifactorial reasons and a neuropathy panel of lab work was ordered and he would be reviewed back. Patient is also due for colonoscopy to be repeated before his bladder and kidney surgery. At the present time he would like [name] to continue with  conservative and symptomatic wound care for both his feet. 10/05/2017 -- since I have seen him last time he has had a colonoscopy which was fairly normal except for a polyp and he has a urology appointment pending later today. He still continues to stay off smoking. 10/26/2017-- he returns after about 3 weeks and in the meanwhile he was seen by Dr. Kellie Simmering, who reviewed his venous studies and noted that there was very minimal enlargement of the bilateral great saphenous vein on the right and somewhat larger vein on the left but no consistent reflux throughout the great saphenous veins and there was also some right small saphenous veins which were enlarged with some reflux. However overall he thought that venous insufficiency was not significantly the cause of his ulceration and it was more of trophic ulcers and hence he recommended aggressive wound care, and at some stage he may need amputation of the left first toe and the right fifth toe. 11/09/2017 -- due to the inclement weather he has missed his urology appointment and is still awaiting the rescheduled one. Other than that he's been doing fairly well. 11/23/2017 -- he has had his cystoscopy done by his urologist and they found multiple areas of concern and he is going to be set up for a procedure sometime later in January. He is on complex care as far as his wounds go and seizures every 2-3 weeks 12/07/17 patient presents today for evaluation concerning his right great toe amputation site as  well as his left great toe ulcer. He has been tolerating the dressing changes fairly well. With that being said there does not appear to be any significant discomfort at this point that he is experiencing although he has quite significant callous especially in regard to the amputation site of the right great toe. Nonetheless he has no evidence of infection which is good news No fevers, chills, nausea, or vomiting noted at this time. He has no  pain. READMISSION 05/09/18; is a patient who hasn't been here in quite some time. He is not a diabetic but he has severe idiopathic peripheral neuropathy. He was here for review of a nonhealing wound on the right great toe amputation site and a large portion of the tip of the left great toe. Sometime after his last visit here he became ill he had abdominal issues infections and required hospitalizations. He largely dropped off the map. He is putting silver alginate on the wounds. I note that he had arterial studies and saw Dr. Bridgett Larsson of vascular surgery was not felt to have an arterial issue. He also had reflux studies and he wears compression stockings and he is faithful with these. He had MRI of both of his feet that not show osteomyelitis question small abscess on the right. He tells me that the nonhealing wound on his right great toe amputation site is been there since the actual amputation which was in 2014 I note the area was aggressively debrided by Dr. Jacqualyn Posey of podiatry before he started coming to this clinic apparently there were abscess is present at that time. His last formal arterial studies were done in August 2018 which time his ABI was 1.05 on the right and 0.95 on the left he had biphasic and triphasic waveforms on the left and monophasic and biphasic waveforms on the right. He did not have TBI's. ABIs in our clinic today were 0.8 bilaterally. The patient states he is not a diabetic. He does have idiopathic peripheral neuropathy. He is a smoker but is trying to quit with Chantix. 05/16/18; x-rays of his bilateral feet were negative for osteomyelitis. He was noted to have a chronic deformity of the head of the second metatarsal which may be due to previous osteonecrosis there was no definite underlying bone destruction to suggest osteomyelitis on either side. He came in with the extensive callus over his right great toe amputation site and the left great toe did extensive debridement last  week and another extensive debridement today.patient is changing his dressing himself with silver alginate 05/30/18; still buildup of thick callus covering thick subcutaneous tissue around both of these areas. This requires an extensive debridement on both sides. I have discussed offloading this area with the patient. I think he inverts when he walks. His foot sizes beyond what we can put in a Darco forefoot off loader or probably what we can put in a total contact cast. 06/13/18; still a remarkable buildup of callus over the right first toe amputation site. The left first toe has 2 open wounds one medially and 1 at the tip of the toe. Surrounding callus is also not back here. We've been using silver alginate. He has a darco forefoot off loader now on the right foot.his own shoe on the left foot. Would like to try a total contact cast on the right. He is making arrangements for someone to drive him here in 2 weeks. 06/29/18; still a buildup of callus over the right first toe amputation site although not  as significant as previously. The left first toe has 2 open wounds one medially and one at the tip surrounding callus as well. We've been using silver alginate. He comes in today with the right for a total contact cast which I'll use on the right 07/04/18 on evaluation today patient appears to be doing decently well in regard to the cast. Unfortunately he did have a little area that rubbed on the lateral aspect of his leg the cast actually cracked on the medial aspect this happened he states just yesterday. He has not been walking in the cast without the boot portion on. With that being said he states that he is unsure of exactly what happened with the crack. Nonetheless this does seem to have caused a little bit of rubbing on the lateral aspect I think due to the integrity of the cast being compromised. Nonetheless I advised him that if this happens in the future he needs to let us know soon as possible.  Nonetheless I do feel like the cast has been of benefit some of the area on the distal portion of his great toe amputation site actually appears to be doing better. 07/12/18 on evaluation today patient appears to be doing very well in regard to his right great toe invitation site. This seems to be showing signs of excellent improvement which is great news. There does not appear to be any evidence of infection at this time. With that being said he has been tolerating the dressing changes without complication. There appears to be no evidence of infection and again this is barely open at this point. The total contact cast has been of excellent benefit for him. I believe this will likely be completely closed and ready next week that we can switch to a cast on the left foot. He states he may want to take a week's break. 07/19/18 on evaluation today patient appears to be doing better in regard to both ulceration areas. The right foot actually appears to be completely healed he still has some callous but no open wound. The left foot/great toe actually seems to be better after last week's debridement overall the appearance of the wound is greatly improved. 08/09/18 on evaluation today patient actually appears to be doing a little bit worse in regard to the right metatarsal invitation site. He's been tolerating the dressing changes without complication. With that being said he does note that the area actually cracked open as of today when he was in the shower trying to scrub it. Nonetheless it does appear that the Caryn Section that previously was open and draining has subsequently reopened and is draining again. Nonetheless as I was looking at him and performing the debridement today I did inquire about whether or not we had ever biopsy the area as I was concerned about the possibility for something along the lines of a work type virus/infection causing the excessive callous buildup. Especially since he seems to grow  much more than what would just be traditionally expected by friction alone. He subsequently told me that he did have this biopsied somewhere around 1-2 years ago by Dr. Jacqualyn Posey and that it showed that he had a wart infection at that point. Nonetheless he was never referred to dermatology better Jacqualyn Posey attempted to cut it out according to the patient. Nonetheless that may be part of the big issue with what we're dealing with here and without treating the wart infection we may not actually get this to heal and stay closed.  READMISSION 12/07/2018 The patient is back in clinic today essentially with wounds in the same area and condition is that I think the last 2 times he has been here. This includes the right first metatarsal head in the setting of a previous right great toe amputation and a large part of the medial part of his left great toe. The patient states he has been dealing with this for years dating back to 2005 on and off. That he is never found this to be totally healed. When he was last in this clinic he had this biopsied shave biopsies which showed hyperkeratotic and parakeratotic debris. Epithelium with papillary features negative for malignancy. He has been using silver alginate. He has healing sandals which he uses Felt on the bottom of these. When he was here last time we put him a total contact cast at least for a while and the patient states on the right that helped with the buildup of hyperkeratotic tissue and that seems to be reflective in our notes. He also states that was previously biopsied by Dr. Jacqualyn Posey of podiatry. The patient has a severe idiopathic peripheral neuropathy which he attributes to a brown recluse spider bite in 2002 or so. He is not a diabetic The patient had arterial studies in this showed an ABI in the right of 1.05 on the left was 0.95. TBI's were not done. He had monophasic waveforms at the right posterior tibial biphasic at the dorsalis pedis. On the left he  was biphasic at the posterior tibial and triphasic at the dorsalis pedis. 1/17; this is a patient with a very challenging issue was readmitted to the clinic last week. He has a large area over the first right MTP in the setting of a previous right great toe amputation. On presentation this is covered with copious amounts of hyperkeratotic thick callus nonviable tissue. When this is debridement both on this occasion and previous occasions in this clinic he has a very boggy presentation to the subcutaneous tissue which looks somewhat atypical. He has a similar area on the left great toe from the tip of the toe down the medial aspect beyond the interphalangeal joint. He's had previous MRIs of both of his feet. On the right that showed small abscesses and he was taken to the OR by podiatry for debridement and cleaning out of the abscesses. An MRI did not show osteomyelitis of the left first toe. Both the MRIs were done in 2018. He saw Dr. Vallarie Mare at the time who did not think he had significant macrovascular disease. He is not a diabetic but he has idiopathic peripheral neuropathy and was a significant smoker. When he was in the clinic earlier in 2019 he had biopsies done of both areas that showed hyperkeratotic material but no malignancy. X-rays I ordered last week did not show osteomyelitis on the left but did suggest osteomyelitis at the tip of the left great toe. I looked at the x-rays myself this is a subtle finding it is only seen on the lateral film. I did a very significant debridement on him last week bilaterally in both the wound areas look better. When he was previously in our clinic the area on the right did improve with a total contact cast. He dropped out of the schedule at that point I'm not exactly sure why 1/24; the biopsy I did of the area over his right first metatarsal head showed the possibility of verruca vulgaris. Superimposed changes of lichen simplex chronicus. If this is  a plantar  wart this would be extremely large. I think he would need topical destructive therapy and I am going to refer him to dermatology. There was no suggestion of malignancy. Previous biopsies in this clinic on both his areas did not show evidence of malignancy either. Until we get dermatology I am going to continue with silver alginate. X-rays suggested osteomyelitis of the left toe I am going to continue him on trimethoprim sulfamethoxazole 2/3; not much change in this area on the right first met head. Extensive dark eschar formation around the central area of nonviable subcutaneous tissue looking like a boggy subcutaneous tissue. Using a #10 scalpel and pickups I removed all of this. I have tried this before and this man but I plan to put him in a total contact cast next week Using a #5 curette debridement of the left great toe He has an appointment with dermatology at [facility] on the 25th 2/11; right first metatarsal head after last week's extensive debridement is right back to the way it was with a nonviable subcutaneous tissue and thick surrounding eschar. The left great toe looks about the same. The patient could not have a total contact cast today because he had household problems [leaking roof] but I am not going to put a cast on him until I have information from the dermatologist about this. READMISSION 06/17/2020 Mr. Mcclune is now a 64 year old man that we have had in this clinic on at least 2 occasions previously. When he was last here in February 2020 I referred him to dermatology at [facility] for their review of large wounds on the right first metatarsal head with surrounding severe chronic hyperkeratosis. This was biopsied on at least 2 occasions in our clinic and I think at [facility] as well that did not show an underlying malignancy. I have reviewed the consult from Dr. Sharlette Dense dermatology from 01/23/2019. At that time the patient had wounds on the plantar right first metatarsal head as well as the  plantar left great toe. From what he is saying the left great toe healed however he recently had to have a left TMA by Dr. 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 Dermatology at [facility] apparently referred him to Dr. Luetta Nutting of plastic surgery and an operative excision of this area was planned however apparently they ran into the pandemic. The procedure was repetitively cancel and the patient did not keep his final appointment which I think was earlier this year he was also seen in February of this year at the wound care center in [facility] by Dr. Zigmund Daniel. They were applying Silvadene cream and Xeroform which she is essentially doing now. I see in his notes that there was still some concern about underlying malignancy although would be difficult to believe as long as this is been there that there would not be more extensive damage at this point in any case he is back in our clinic for review of this. He had an x-ray of the foot on 01/23/2020 that was negative for osteomyelitis acute fracture. Past medical history; idiopathic peripheral neuropathy, right great toe amputation in 2011, lumbar spinal stenosis, left transmetatarsal amputation on 05/30/2020 by Dr. Sharol Given and more recently apparently a slight wound dehiscence. He also had a fracture of his left tibial plateau sometime in 2020. ABI in our clinic was 0.95 8/9-Patient returns after being established in the clinic last time, he has a new open area on the  plantar aspect of his right second toe, the right foot plantar ulcer below the great toe on the right looks slightly smaller, using PolyMem. Patient attends to his surgeon for the left foot wound 8/23; this is a patient with a refractory wound over his right first metatarsal head in the setting of severe surrounding hyperkeratosis. I had referred him to dermatology subsequently came to the attention of Dr. Vernona Rieger and Dr. Zigmund Daniel at [facility]. He also had a  left TMA by Dr. Sharol Given on the left foot because of osteomyelitis I believe. He is in bilateral surgical shoes. 9/13; refractory wound over the right first metatarsal head with surrounding hyperkeratosis Verrucous skin. He had a left transmetatarsal amputation by Dr. Sharol Given. We have not been following this however this apparently is still not closed. I have looked over his notes from [facility]. This is well summarized by a note from Dr. Zigmund Daniel on February 24. The patient had had several biopsies of the skin of the first toe metatarsal head. As well as the left great toe which has since been amputated. The pathology results showed Stratus corneum with hyperkeratosis and parakeratosis. An underlying verricous carcinoma could not be excluded. I think the patient was given follow-up but I will actually think that he kept his appointments. He was sent back to follow-up with him with them again in February 2021. Dr. Luetta Nutting who is the plastic surgeon recommended wound care rather than extensive debridement. I am very doubtful that this represents any form of malignancy. This patient has been dealing with this wound for years and I think we would have had a declaration of status well before this if this were any form of malignancy. I also do not believe he has an arterial issue. ABI in the clinic here was 0.95. He has easily palpable peripheral pulses. 10/4; generally gradually better wound area over the right plantar foot first metatarsal head.Marland Kitchen He still has thick surrounding callus around the wound margins and a fibrinous debris over the wound surface although in general a lot of this looks considerably better. He has been following with orthopedics for a left amputation by Dr. Sharol Given. We have not been following this 10/18; first metatarsal head wound is still open however there is less adherent callus and thick skin around the edge of this. I think this largely has to do with aggressive debridement last time and I am  going to repeat this today. He is still dealing with a wound on the left transmetatarsal amputation site this is being followed by Dr. Sharol Given. He has a surgical shoe on the left foot we have not been following this. He is using crutches apparently reliably to offload both his feet. 11/1 right first metatarsal head. Smaller surface area considerable amount of callus around this wound. This is a chronic issue. He has had a left transmetatarsal amputation followed by Dr. Sharol Given there is still wounds in this area he is followed with Dr. Sharol Given has a surgical shoe here Electronic Signature(s) Signed: 09/29/2020 5:37:06 PM By: [name] MD Entered By: [name] on 09/29/2020 11:41:10 -------------------------------------------------------------------------------- Physical Exam Details Patient Name: Date of Service: [name], [name]. 09/29/2020 10:15 A M Medical Record Number: [id number] Patient Account Number: [id number] Date of Birth/Sex: Treating RN: [birth year] (64 y.o. [name] Primary Care Provider: [name] Other Clinician: Referring Provider: Treating Provider/Extender: [name] in Treatment: 14 Constitutional Patient is hypertensive.. Pulse regular and within target range for patient.Marland Kitchen Respirations regular, non-labored  and within target range.. Temperature is normal and within the target range for the patient.Marland Kitchen Appears in no distress. Cardiovascular Pedal pulses are palpable. Notes Wound exam; wound surfaces on his right first metatarsal head again at covered in thick callus. I did not debride this today the wound surface looks satisfactory. Dimensions of the wound are down slightly there is no evidence of infection in this area Electronic Signature(s) Signed: 09/29/2020 5:37:06 PM By: [name] MD Entered By: [name] on 09/29/2020  11:42:31 -------------------------------------------------------------------------------- Physician Orders Details Patient Name: Date of Service: [name], [name]. 09/29/2020 10:15 A M Medical Record Number: [id number] Patient Account Number: [id number] Date of Birth/Sex: Treating RN: [birth year] (65 y.o. [name] Primary Care Provider: [name] Other Clinician: Referring Provider: Treating Provider/Extender: [name] in Treatment: [id number] Verbal / Phone Orders: No Diagnosis Coding ICD-10 Coding Code Description L97.518 Non-pressure chronic ulcer of other part of right foot with other specified severity G90.09 Other idiopathic peripheral autonomic neuropathy Follow-up Appointments Return Appointment in 2 weeks. Dressing Change Frequency Wound #12 Right Metatarsal head first Change Dressing every other day. Skin Barriers/Peri-Wound Care Moisturizing lotion - to feet daily Wound Cleansing Wound #12 Right Metatarsal head first Clean wound with Wound Cleanser - or normal saline Primary Wound Dressing Wound #12 Right Metatarsal head first Polymem Silver Secondary Dressing Wound #12 Right Metatarsal head first Kerlix/Rolled Gauze - secure with tape Dry Gauze Other: - felt callous pad or foam donut Off-Loading Open toe surgical shoe to: - right foot [facility] skilled nursing for wound care. Alvis Lemmings Electronic Signature(s) Signed: 09/29/2020 5:37:06 PM By: [name] MD Signed: 09/29/2020 5:57:05 PM By: Levan Hurst RN, BSN Entered By: Levan Hurst on 09/29/2020 11:44:44 -------------------------------------------------------------------------------- Problem List Details Patient Name: Date of Service: [name], [name]. 09/29/2020 10:15 A M Medical Record Number: [id number] Patient Account Number: [id number] Date of Birth/Sex: Treating RN: Jun 09, 1956 (64 y.o. [name] Primary Care Provider:  [name] Other Clinician: Referring Provider: Treating Provider/Extender: [name] in Treatment: 14 Active Problems ICD-10 Encounter Code Description Active Date MDM Diagnosis L97.518 Non-pressure chronic ulcer of other part of right foot with other specified 06/17/2020 No Yes severity G90.09 Other idiopathic peripheral autonomic neuropathy 06/17/2020 No Yes Inactive Problems Resolved Problems Electronic Signature(s) Signed: 09/29/2020 5:37:06 PM By: [name] MD Entered By: [name] on 09/29/2020 11:39:12 -------------------------------------------------------------------------------- Progress Note Details Patient Name: Date of Service: [name], [name]. 09/29/2020 10:15 A M Medical Record Number: [id number] Patient Account Number: [id number] Date of Birth/Sex: Treating RN: 29-Dec-1955 (64 y.o. [name] Primary Care Provider: [name] Other Clinician: Referring Provider: Treating Provider/Extender: [name] in Treatment: 14 Subjective History of Present Illness (HPI) The following HPI elements were documented for the patient's wound: Location: right first metatarsal head on the plantar aspect and the left big toe and the right fifth toe Quality: Patient reports No Pain. Severity: Patient states wound(s) are getting worse. Duration: Patient has had the wound for > 24 months prior to seeking treatment at the wound center Context: The wound would happen gradually Modifying Factors: Wound improving due to current treatment. he has been under podiatry care for over a year and a half Associated Signs and Symptoms: Patient reports having: heaviness in both legs This 64 year old gentleman who has had a long-standing neuropathy of both feet without history of diabetes mellitus has never been worked up  for his neurological problem in the last 5 years or so. Most recently he has been seen by the  podiatrist Dr. [name] who was been seeing them for about a year and a half and from what I understand has been treating him for bilateral ulceration on the feet, the right being in the region of the first metatarsal head plantar aspect and the left being in the area of the left big toe. He's had various antibiotics including Levaquin recently and this was then switched to Cipro and clindamycin. Due to the nature of this wound which has been there for a long while he has been recently referred to [name] for an opinion. An MRI was done in the middle of June which showed several small abscesses near the amputation site of the right great toe with draining and open wounds with surrounding cellulitis but no evidence of septic arthritis, osteomyelitis or pyomyositis. I understand at this stage he was taken to the operating room by [name] who debrided the wound and cleaned out the abscess. I understand ABIs were done bilaterally and they were within normal limits at Dr. [name] office. The patient is a smoker and is working on quitting smoking. 07/13/2017 -- had a lower extremity arterial duplex evaluation which showed patent bilateral lower extremity arteries without evidence of hemodynamically significant stenosis. His right ABI was 1.05 and the left was 0.95 He had a lower extremity venous duplex reflux evaluation done which showed significant venous incompetence in the bilateral common femoral, saphenofemoral junction, small saphenous vein and in the right great saphenous vein. With these findings I believe he will benefit from a consultation with the vascular surgeon regarding the endovenous ablation procedure. the patient's neurology appointment and dermatology appointment is still pending. He has had a x-ray done by his PCP and the results are not available. However he did see [name] who I understand has ordered a bilateral MRI of his feet. 07/26/2017 -- was seen by [name] on  07/20/2017. His impression was that he presented with minimal bilateral lower extremity peripheral arterial disease, bilateral lower extremity chronic venous insufficiency (C4) and radiculopathy versus neuropathy bilateral lower extremity. No arterial intervention was needed at the present time. He recommended maximal medical management at this stage for his atherosclerotic disease . He recommended compression to be continued and no venous intervention at the present time. He was offered follow-up in the Vein clinic in 3 months time. MR of the right foot done with and without contrast -- IMPRESSION:Wound on the plantar surface of the foot at approximately the level of first MTP joint with a tiny underlying soft tissue abscess. Large area of soft tissue edema subjacent to the wound is most compatible with granulation tissue. Negative for osteomyelitis about the first MTP joint. Mild edema and enhancement in the proximal phalanx of the little toe is stable compared to the prior examination and may be due to stress change. Infection is possible but thought highly unlikely. Subcutaneous edema over the dorsum of the foot compatible with dependent change/cellulitis. the patient also has had a abdominal CT which shows a kidney mass and is going to have her MRI for this. He also has a urology opinion pending and a general surgeon's opinion regarding a colonic mass. 08/02/2017 -- his appointments with his other urology and surgical consultants is still later. He has an MRI of the left foot later this week. 08/09/2017 --MR of the left foot with and without contrast -- IMPRESSION:Large appearing  skin wound on the medial aspect of the great toe without underlying abscess or osteomyelitis.First MTP osteoarthritis. Marked marrow edema in the medial sesamoid bone is likely related to osteoarthritis but could be due to sesamoiditis. 08/16/2017 -- the patient has had a surgical opinion and a urological opinion and a  extensive surgery including possible colon resection, bladder surgery and kidney surgery is being planned. No date has been set for his procedure He continues to be off smoking 08/31/2017 -- he was seen by the neurologist for his multiple problems of neuropathic and radicular symptoms in both legs with a thorough workup done. MRIs were reviewed lab work was reviewed and the assessment was that he had severe neuropathy due to multifactorial reasons and a neuropathy panel of lab work was ordered and he would be reviewed back. Patient is also due for colonoscopy to be repeated before his bladder and kidney surgery. At the present time he would like [name] to continue with conservative and symptomatic wound care for both his feet. 10/05/2017 -- since I have seen him last time he has had a colonoscopy which was fairly normal except for a polyp and he has a urology appointment pending later today. He still continues to stay off smoking. 10/26/2017-- he returns after about 3 weeks and in the meanwhile he was seen by Dr. Kellie Simmering, who reviewed his venous studies and noted that there was very minimal enlargement of the bilateral great saphenous vein on the right and somewhat larger vein on the left but no consistent reflux throughout the great saphenous veins and there was also some right small saphenous veins which were enlarged with some reflux. However overall he thought that venous insufficiency was not significantly the cause of his ulceration and it was more of trophic ulcers and hence he recommended aggressive wound care, and at some stage he may need amputation of the left first toe and the right fifth toe. 11/09/2017 -- due to the inclement weather he has missed his urology appointment and is still awaiting the rescheduled one. Other than that he's been doing fairly well. 11/23/2017 -- he has had his cystoscopy done by his urologist and they found multiple areas of concern and he is going to be set up for  a procedure sometime later in January. He is on complex care as far as his wounds go and seizures every 2-3 weeks 12/07/17 patient presents today for evaluation concerning his right great toe amputation site as well as his left great toe ulcer. He has been tolerating the dressing changes fairly well. With that being said there does not appear to be any significant discomfort at this point that he is experiencing although he has quite significant callous especially in regard to the amputation site of the right great toe. Nonetheless he has no evidence of infection which is good news No fevers, chills, nausea, or vomiting noted at this time. He has no pain. READMISSION 05/09/18; is a patient who hasn't been here in quite some time. He is not a diabetic but he has severe idiopathic peripheral neuropathy. He was here for review of a nonhealing wound on the right great toe amputation site and a large portion of the tip of the left great toe. Sometime after his last visit here he became ill he had abdominal issues infections and required hospitalizations. He largely dropped off the map. He is putting silver alginate on the wounds. I note that he had arterial studies and saw Dr. Bridgett Larsson of vascular surgery was  not felt to have an arterial issue. He also had reflux studies and he wears compression stockings and he is faithful with these. He had MRI of both of his feet that not show osteomyelitis question small abscess on the right. He tells me that the nonhealing wound on his right great toe amputation site is been there since the actual amputation which was in 2014 I note the area was aggressively debrided by Dr. Jacqualyn Posey of podiatry before he started coming to this clinic apparently there were abscess is present at that time. His last formal arterial studies were done in August 2018 which time his ABI was 1.05 on the right and 0.95 on the left he had biphasic and triphasic waveforms on the left and monophasic and  biphasic waveforms on the right. He did not have TBI's. ABIs in our clinic today were 0.8 bilaterally. The patient states he is not a diabetic. He does have idiopathic peripheral neuropathy. He is a smoker but is trying to quit with Chantix. 05/16/18; x-rays of his bilateral feet were negative for osteomyelitis. He was noted to have a chronic deformity of the head of the second metatarsal which may be due to previous osteonecrosis there was no definite underlying bone destruction to suggest osteomyelitis on either side. He came in with the extensive callus over his right great toe amputation site and the left great toe did extensive debridement last week and another extensive debridement today.patient is changing his dressing himself with silver alginate 05/30/18; still buildup of thick callus covering thick subcutaneous tissue around both of these areas. This requires an extensive debridement on both sides. I have discussed offloading this area with the patient. I think he inverts when he walks. His foot sizes beyond what we can put in a Darco forefoot off loader or probably what we can put in a total contact cast. 06/13/18; still a remarkable buildup of callus over the right first toe amputation site. The left first toe has 2 open wounds one medially and 1 at the tip of the toe. Surrounding callus is also not back here. We've been using silver alginate. He has a darco forefoot off loader now on the right foot.his own shoe on the left foot. Would like to try a total contact cast on the right. He is making arrangements for someone to drive him here in 2 weeks. 06/29/18; still a buildup of callus over the right first toe amputation site although not as significant as previously. The left first toe has 2 open wounds one medially and one at the tip surrounding callus as well. We've been using silver alginate. He comes in today with the right for a total contact cast which I'll use on the right 07/04/18 on  evaluation today patient appears to be doing decently well in regard to the cast. Unfortunately he did have a little area that rubbed on the lateral aspect of his leg the cast actually cracked on the medial aspect this happened he states just yesterday. He has not been walking in the cast without the boot portion on. With that being said he states that he is unsure of exactly what happened with the crack. Nonetheless this does seem to have caused a little bit of rubbing on the lateral aspect I think due to the integrity of the cast being compromised. Nonetheless I advised him that if this happens in the future he needs to let us know soon as possible. Nonetheless I do feel like the cast has been  of benefit some of the area on the distal portion of his great toe amputation site actually appears to be doing better. 07/12/18 on evaluation today patient appears to be doing very well in regard to his right great toe invitation site. This seems to be showing signs of excellent improvement which is great news. There does not appear to be any evidence of infection at this time. With that being said he has been tolerating the dressing changes without complication. There appears to be no evidence of infection and again this is barely open at this point. The total contact cast has been of excellent benefit for him. I believe this will likely be completely closed and ready next week that we can switch to a cast on the left foot. He states he may want to take a week's break. 07/19/18 on evaluation today patient appears to be doing better in regard to both ulceration areas. The right foot actually appears to be completely healed he still has some callous but no open wound. The left foot/great toe actually seems to be better after last week's debridement overall the appearance of the wound is greatly improved. 08/09/18 on evaluation today patient actually appears to be doing a little bit worse in regard to the right  metatarsal invitation site. He's been tolerating the dressing changes without complication. With that being said he does note that the area actually cracked open as of today when he was in the shower trying to scrub it. Nonetheless it does appear that the Caryn Section that previously was open and draining has subsequently reopened and is draining again. Nonetheless as I was looking at him and performing the debridement today I did inquire about whether or not we had ever biopsy the area as I was concerned about the possibility for something along the lines of a work type virus/infection causing the excessive callous buildup. Especially since he seems to grow much more than what would just be traditionally expected by friction alone. He subsequently told me that he did have this biopsied somewhere around 1-2 years ago by Dr. Jacqualyn Posey and that it showed that he had a wart infection at that point. Nonetheless he was never referred to dermatology better Jacqualyn Posey attempted to cut it out according to the patient. Nonetheless that may be part of the big issue with what we're dealing with here and without treating the wart infection we may not actually get this to heal and stay closed. READMISSION 12/07/2018 The patient is back in clinic today essentially with wounds in the same area and condition is that I think the last 2 times he has been here. This includes the right first metatarsal head in the setting of a previous right great toe amputation and a large part of the medial part of his left great toe. The patient states he has been dealing with this for years dating back to 2005 on and off. That he is never found this to be totally healed. When he was last in this clinic he had this biopsied shave biopsies which showed hyperkeratotic and parakeratotic debris. Epithelium with papillary features negative for malignancy. He has been using silver alginate. He has healing sandals which he uses Felt on the bottom of  these. When he was here last time we put him a total contact cast at least for a while and the patient states on the right that helped with the buildup of hyperkeratotic tissue and that seems to be reflective in our notes. He also states  that was previously biopsied by Dr. Jacqualyn Posey of podiatry. The patient has a severe idiopathic peripheral neuropathy which he attributes to a brown recluse spider bite in 2002 or so. He is not a diabetic The patient had arterial studies in this showed an ABI in the right of 1.05 on the left was 0.95. TBI's were not done. He had monophasic waveforms at the right posterior tibial biphasic at the dorsalis pedis. On the left he was biphasic at the posterior tibial and triphasic at the dorsalis pedis. 1/17; this is a patient with a very challenging issue was readmitted to the clinic last week. He has a large area over the first right MTP in the setting of a previous right great toe amputation. On presentation this is covered with copious amounts of hyperkeratotic thick callus nonviable tissue. When this is debridement both on this occasion and previous occasions in this clinic he has a very boggy presentation to the subcutaneous tissue which looks somewhat atypical. He has a similar area on the left great toe from the tip of the toe down the medial aspect beyond the interphalangeal joint. He's had previous MRIs of both of his feet. On the right that showed small abscesses and he was taken to the OR by podiatry for debridement and cleaning out of the abscesses. An MRI did not show osteomyelitis of the left first toe. Both the MRIs were done in 2018. He saw Dr. Vallarie Mare at the time who did not think he had significant macrovascular disease. He is not a diabetic but he has idiopathic peripheral neuropathy and was a significant smoker. When he was in the clinic earlier in 2019 he had biopsies done of both areas that showed hyperkeratotic material but no malignancy. X-rays I ordered  last week did not show osteomyelitis on the left but did suggest osteomyelitis at the tip of the left great toe. I looked at the x-rays myself this is a subtle finding it is only seen on the lateral film. I did a very significant debridement on him last week bilaterally in both the wound areas look better. When he was previously in our clinic the area on the right did improve with a total contact cast. He dropped out of the schedule at that point I'm not exactly sure why 1/24; the biopsy I did of the area over his right first metatarsal head showed the possibility of verruca vulgaris. Superimposed changes of lichen simplex chronicus. If this is a plantar wart this would be extremely large. I think he would need topical destructive therapy and I am going to refer him to dermatology. There was no suggestion of malignancy. Previous biopsies in this clinic on both his areas did not show evidence of malignancy either. Until we get dermatology I am going to continue with silver alginate. X-rays suggested osteomyelitis of the left toe I am going to continue him on trimethoprim sulfamethoxazole 2/3; not much change in this area on the right first met head. Extensive dark eschar formation around the central area of nonviable subcutaneous tissue looking like a boggy subcutaneous tissue. Using a #10 scalpel and pickups I removed all of this. I have tried this before and this man but I plan to put him in a total contact cast next week ooUsing a #5 curette debridement of the left great toe ooHe has an appointment with dermatology at [facility] on the 25th 2/11; right first metatarsal head after last week's extensive debridement is right back to the way it was with  a nonviable subcutaneous tissue and thick surrounding eschar. The left great toe looks about the same. The patient could not have a total contact cast today because he had household problems [leaking roof] but I am not going to put a cast on him until I  have information from the dermatologist about this. READMISSION 06/17/2020 Mr. Kampa is now a 64 year old man that we have had in this clinic on at least 2 occasions previously. When he was last here in February 2020 I referred him to dermatology at [facility] for their review of large wounds on the right first metatarsal head with surrounding severe chronic hyperkeratosis. This was biopsied on at least 2 occasions in our clinic and I think at [facility] as well that did not show an underlying malignancy. I have reviewed the consult from Dr. Sharlette Dense dermatology from 01/23/2019. At that time the patient had wounds on the plantar right first metatarsal head as well as the plantar left great toe. From what he is saying the left great toe healed however he recently had to have a left TMA by Dr. 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 Dermatology at [facility] apparently referred him to Dr. Luetta Nutting of plastic surgery and an operative excision of this area was planned however apparently they ran into the pandemic. The procedure was repetitively cancel and the patient did not keep his final appointment which I think was earlier this year he was also seen in February of this year at the wound care center in [facility] by Dr. Zigmund Daniel. They were applying Silvadene cream and Xeroform which she is essentially doing now. I see in his notes that there was still some concern about underlying malignancy although would be difficult to believe as long as this is been there that there would not be more extensive damage at this point in any case he is back in our clinic for review of this. He had an x-ray of the foot on 01/23/2020 that was negative for osteomyelitis acute fracture. Past medical history; idiopathic peripheral neuropathy, right great toe amputation in 2011, lumbar spinal stenosis, left transmetatarsal amputation on 05/30/2020 by Dr. Sharol Given and more recently apparently a slight  wound dehiscence. He also had a fracture of his left tibial plateau sometime in 2020. ABI in our clinic was 0.95 8/9-Patient returns after being established in the clinic last time, he has a new open area on the plantar aspect of his right second toe, the right foot plantar ulcer below the great toe on the right looks slightly smaller, using PolyMem. Patient attends to his surgeon for the left foot wound 8/23; this is a patient with a refractory wound over his right first metatarsal head in the setting of severe surrounding hyperkeratosis. I had referred him to dermatology subsequently came to the attention of Dr. Vernona Rieger and Dr. Zigmund Daniel at [facility]. He also had a left TMA by Dr. Sharol Given on the left foot because of osteomyelitis I believe. He is in bilateral surgical shoes. 9/13; refractory wound over the right first metatarsal head with surrounding hyperkeratosis Verrucous skin. He had a left transmetatarsal amputation by Dr. Sharol Given. We have not been following this however this apparently is still not closed. I have looked over his notes from [facility]. This is well summarized by a note from Dr. Zigmund Daniel on February 24. The patient had had several biopsies of the skin of the first toe metatarsal head. As well as the left great toe  which has since been amputated. The pathology results showed Stratus corneum with hyperkeratosis and parakeratosis. An underlying verricous carcinoma could not be excluded. I think the patient was given follow-up but I will actually think that he kept his appointments. He was sent back to follow-up with him with them again in February 2021. Dr. Luetta Nutting who is the plastic surgeon recommended wound care rather than extensive debridement. I am very doubtful that this represents any form of malignancy. This patient has been dealing with this wound for years and I think we would have had a declaration of status well before this if this were any form of malignancy. I also do not believe he has an  arterial issue. ABI in the clinic here was 0.95. He has easily palpable peripheral pulses. 10/4; generally gradually better wound area over the right plantar foot first metatarsal head.Marland Kitchen He still has thick surrounding callus around the wound margins and a fibrinous debris over the wound surface although in general a lot of this looks considerably better. He has been following with orthopedics for a left amputation by Dr. Sharol Given. We have not been following this 10/18; first metatarsal head wound is still open however there is less adherent callus and thick skin around the edge of this. I think this largely has to do with aggressive debridement last time and I am going to repeat this today. He is still dealing with a wound on the left transmetatarsal amputation site this is being followed by Dr. Sharol Given. He has a surgical shoe on the left foot we have not been following this. He is using crutches apparently reliably to offload both his feet. 11/1 right first metatarsal head. Smaller surface area considerable amount of callus around this wound. This is a chronic issue. He has had a left transmetatarsal amputation followed by Dr. Sharol Given there is still wounds in this area he is followed with Dr. Sharol Given has a surgical shoe here Objective Constitutional Patient is hypertensive.. Pulse regular and within target range for patient.Marland Kitchen Respirations regular, non-labored and within target range.. Temperature is normal and within the target range for the patient.Marland Kitchen Appears in no distress. Vitals Time Taken: 10:24 AM, Height: 80 in, Source: Stated, Weight: 350 lbs, Source: Stated, BMI: 38.4, Temperature: 97.5 F, Pulse: 87 bpm, Respiratory Rate: 18 breaths/min, Blood Pressure: 148/75 mmHg. Cardiovascular Pedal pulses are palpable. General Notes: Wound exam; wound surfaces on his right first metatarsal head again at covered in thick callus. I did not debride this today the wound surface looks satisfactory. Dimensions of the  wound are down slightly there is no evidence of infection in this area Integumentary (Hair, Skin) Wound #12 status is Open. Original cause of wound was Gradually Appeared. The wound is located on the Right Metatarsal head first. The wound measures 2.1cm length x 1.5cm width x 0.4cm depth; 2.474cm^2 area and 0.99cm^3 volume. There is Fat Layer (Subcutaneous Tissue) exposed. There is no tunneling or undermining noted. There is a medium amount of serosanguineous drainage noted. The wound margin is thickened. There is large (67-100%) pink, pale granulation within the wound bed. There is no necrotic tissue within the wound bed. Assessment Active Problems ICD-10 Non-pressure chronic ulcer of other part of right foot with other specified severity Other idiopathic peripheral autonomic neuropathy Plan Follow-up Appointments: Return Appointment in 2 weeks. Dressing Change Frequency: Wound #12 Right Metatarsal head first: Change Dressing every other day. Skin Barriers/Peri-Wound Care: Moisturizing lotion - to feet daily Wound Cleansing: Wound #12 Right Metatarsal head first: Clean  wound with Wound Cleanser - or normal saline Primary Wound Dressing: Wound #12 Right Metatarsal head first: Polymem Silver Secondary Dressing: Wound #12 Right Metatarsal head first: Kerlix/Rolled Gauze Dry Gauze Other: - felt callous pad or foam donut Off-Loading: Open toe surgical shoe to: - right foot Home Health: [facility] for wound care. - [facility] 1. I would continue with the polymen silver, kerlix rolled gauze 2. He has a surgical shoe here as well as on the left 3. He says he is going to ask Dr. Sharol Given if we can follow with a surgical wound in the left TMA which is fine but he has mentioned this before and it does not seem to materialize 4. This is not a wound that needs to be rebiopsied. He had multiple biopsies done here and at [facility] previously [employer]) Signed:  09/29/2020 5:37:06 PM By: [name] MD Entered By: [name] on 09/29/2020 11:43:53 -------------------------------------------------------------------------------- SuperBill Details Patient Name: Date of Service: [name], [name]. 09/29/2020 Medical Record Number: [id number] Patient Account Number: [id number] Date of Birth/Sex: Treating RN: [birth year] (64 y.o. [name] Primary Care Provider: [name] Other Clinician: Referring Provider: Treating Provider/Extender: [name] in Treatment: 14 Diagnosis Coding ICD-10 Codes Code Description L97.518 Non-pressure chronic ulcer of other part of right foot with other specified severity G90.09 Other idiopathic peripheral autonomic neuropathy Facility Procedures CPT4 Code: 33383291 Description: 99213 - WOUND CARE VISIT-LEV 3 EST PT Modifier: Quantity: 1 Physician Procedures : CPT4 Code Description Modifier [id number] - WC PHYS LEVEL 3 - EST PT ICD-10 Diagnosis Description L97.518 Non-pressure chronic ulcer of other part of right foot with other specified severity G90.09 Other idiopathic peripheral autonomic neuropathy Quantity: 1 Electronic Signature(s) Signed: 09/29/2020 5:37:06 PM By: [name] MD Signed: 09/29/2020 5:57:05 PM By: Levan Hurst RN, BSN Entered By: Levan Hurst on 09/29/2020 13:00:20

## 2020-09-29 NOTE — Progress Notes (Signed)
[name], [name] ([id number]) Visit Report for 09/29/2020 Arrival Information Details Patient Name: Date of Service: [name], [name] 09/29/2020 10:15 A M Medical Record Number: [id number] Patient Account Number: [id number] Date of Birth/Sex: Treating RN: [birth year] (64 y.o. [name] Primary Care Hoover Grewe: [name] Other Clinician: Referring Nivea Wojdyla: Treating Maciej Schweitzer/Extender: [name] in Treatment: 14 Visit Information History Since Last Visit Added or deleted any medications: Yes Patient Arrived: Crutches Any new allergies or adverse reactions: No Arrival Time: 10:19 Had a fall or experienced change in No Accompanied By: self activities of daily living that may affect Transfer Assistance: None risk of falls: Patient Identification Verified: Yes Signs or symptoms of abuse/neglect since No Secondary Verification Process Completed: Yes last visito Patient Requires Transmission-Based Precautions: No Hospitalized since last visit: No Patient Has Alerts: No Has Dressing in Place as Prescribed: Yes Has Footwear/Offloading in Place as Yes Prescribed: Left: Surgical Shoe with Pressure Relief Insole Right: Surgical Shoe with Pressure Relief Insole Pain Present Now: No Electronic Signature(s) Signed: 09/29/2020 5:46:37 PM By: [name] RN, BSN Entered By: [name] on 09/29/2020 10:24:02 -------------------------------------------------------------------------------- Clinic Level of Care Assessment Details Patient Name: Date of Service: [name], [name] 09/29/2020 10:15 A M Medical Record Number: [id number] Patient Account Number: [id number] Date of Birth/Sex: Treating RN: July 12, 1956 (64 y.o. [name] Primary Care Donalda Job: [name] Other Clinician: Referring Werner Labella: Treating Tino Ronan/Extender: [name] in Treatment: 14 Clinic Level of Care Assessment Items TOOL 4  Quantity Score X- 1 0 Use when only an EandM is performed on FOLLOW-UP visit ASSESSMENTS - Nursing Assessment / Reassessment X- 1 10 Reassessment of Co-morbidities (includes updates in patient status) X- 1 5 Reassessment of Adherence to Treatment Plan ASSESSMENTS - Wound and Skin A ssessment / Reassessment X - Simple Wound Assessment / Reassessment - one wound 1 5 _0  - 0 Complex Wound Assessment / Reassessment - multiple wounds _1  - 0 Dermatologic / Skin Assessment (not related to wound area) ASSESSMENTS - Focused Assessment _2  - 0 Circumferential Edema Measurements - multi extremities _3  - 0 Nutritional Assessment / Counseling / Intervention X- 1 5 Lower Extremity Assessment (monofilament, tuning fork, pulses) _4  - 0 Peripheral Arterial Disease Assessment (using hand held doppler) ASSESSMENTS - Ostomy and/or Continence Assessment and Care _5  - 0 Incontinence Assessment and Management _6  - 0 Ostomy Care Assessment and Management (repouching, etc.) PROCESS - Coordination of Care X - Simple Patient / Family Education for ongoing care 1 15 _7  - 0 Complex (extensive) Patient / Family Education for ongoing care X- 1 10 Staff obtains Programmer, systems, Records, T Results / Process Orders est X- 1 10 Staff telephones HHA, Nursing Homes / Clarify orders / etc _8  - 0 Routine Transfer to another Facility (non-emergent condition) _9  - 0 Routine Hospital Admission (non-emergent condition) _10  - 0 New Admissions / Biomedical engineer / Ordering NPWT Apligraf, etc. , _11  - 0 Emergency Hospital Admission (emergent condition) X- 1 10 Simple Discharge Coordination _12  - 0 Complex (extensive) Discharge Coordination PROCESS - Special Needs _13  - 0 Pediatric / Minor Patient Management _14  - 0 Isolation Patient Management _15  - 0 Hearing / Language / Visual special needs _16  - 0 Assessment of Community assistance (transportation, D/C planning, etc.) _17  - 0 Additional assistance / Altered  mentation _18  - 0 Support Surface(s) Assessment (bed, cushion, seat, etc.) INTERVENTIONS - Wound Cleansing / Measurement X - Simple Wound Cleansing - one wound 1 5 _19  -  0 Complex Wound Cleansing - multiple wounds X- 1 5 Wound Imaging (photographs - any number of wounds) _0  - 0 Wound Tracing (instead of photographs) X- 1 5 Simple Wound Measurement - one wound _1  - 0 Complex Wound Measurement - multiple wounds INTERVENTIONS - Wound Dressings _2  - 0 Small Wound Dressing one or multiple wounds X- 1 15 Medium Wound Dressing one or multiple wounds _3  - 0 Large Wound Dressing one or multiple wounds X- 1 5 Application of Medications - topical <FUXNATFTDDUKGURK>_2<\/HCWCBJSEGBTDVVOH>_6  - 0 Application of Medications - injection INTERVENTIONS - Miscellaneous _5  - 0 External ear exam _6  - 0 Specimen Collection (cultures, biopsies, blood, body fluids, etc.) _7  - 0 Specimen(s) / Culture(s) sent or taken to Lab for analysis _8  - 0 Patient Transfer (multiple staff / Civil Service fast streamer / Similar devices) _9  - 0 Simple Staple / Suture removal (25 or less) _10  - 0 Complex Staple / Suture removal (26 or more) _11  - 0 Hypo / Hyperglycemic Management (close monitor of Blood Glucose) _12  - 0 Ankle / Brachial Index (ABI) - do not check if billed separately X- 1 5 Vital Signs Has the patient been seen at the hospital within the last three years: Yes Total Score: 110 Level Of Care: New/Established - Level 3 Electronic Signature(s) Signed: 09/29/2020 5:57:05 PM By: Levan Hurst RN, BSN Entered By: Levan Hurst on 09/29/2020 13:00:04 -------------------------------------------------------------------------------- Encounter Discharge Information Details Patient Name: Date of Service: [name], [name] 09/29/2020 10:15 A M Medical Record Number: [id number] Patient Account Number: [id number] Date of Birth/Sex: Treating RN: 22-Jul-1956 (64 y.o. [name] Primary Care Aldahir Litaker: [name] Other Clinician: Referring  Ethan Clayburn: Treating Amyria Komar/Extender: [name] in Treatment: 14 Encounter Discharge Information Items Discharge Condition: Stable Ambulatory Status: Crutches Discharge Destination: Home Transportation: Private Auto Accompanied By: self Schedule Follow-up Appointment: Yes Clinical Summary of Care: Electronic Signature(s) Signed: 09/29/2020 6:07:40 PM By: Deon Pilling Entered By: Deon Pilling on 09/29/2020 12:33:54 -------------------------------------------------------------------------------- Lower Extremity Assessment Details Patient Name: Date of Service: [name], [name] 09/29/2020 10:15 A M Medical Record Number: [id number] Patient Account Number: [id number] Date of Birth/Sex: Treating RN: Apr 12, 1956 (64 y.o. [name] Primary Care Randel Hargens: [name] Other Clinician: Referring Zlatan Hornback: Treating Brynden Thune/Extender: [name] in Treatment: 14 Edema Assessment Assessed: [Left: No] [Right: No] Edema: [Left: N] [Right: o] Calf Left: Right: Point of Measurement: From Medial Instep 42 cm Ankle Left: Right: Point of Measurement: From Medial Instep 25.5 cm Vascular Assessment Pulses: Dorsalis Pedis Palpable: [Right:Yes] Electronic Signature(s) Signed: 09/29/2020 5:46:37 PM By: [name] RN, BSN Entered By: [name] on 09/29/2020 10:25:36 -------------------------------------------------------------------------------- Multi Wound Chart Details Patient Name: Date of Service: [name], [name] 09/29/2020 10:15 A M Medical Record Number: [id number] Patient Account Number: [id number] Date of Birth/Sex: Treating RN: [birth year] (64 y.o. [name] Primary Care Adri Schloss: [name] Other Clinician: Referring Sebron Mcmahill: Treating Desiderio Dolata/Extender: [name] in Treatment: 14 Vital Signs Height(in): 80 Pulse(bpm): 25 Weight(lbs): 350 Blood  Pressure(mmHg): 148/75 Body Mass Index(BMI): 38 Temperature(F): 97.5 Respiratory Rate(breaths/min): 18 Photos: [12:No Photos Right Metatarsal head first] [N/A:N/A N/A] Wound Location: [12:Gradually Appeared] [N/A:N/A] Wounding Event: [12:Neuropathic Ulcer-Non Diabetic] [N/A:N/A] Primary Etiology: [12:Anemia, Chronic Obstructive] [N/A:N/A] Comorbid History: [12:Pulmonary Disease (COPD), Hypertension, Osteoarthritis, Osteomyelitis, Neuropathy 11/29/2018] [N/A:N/A] Date Acquired: [12:14] [N/A:N/A] Weeks of Treatment: [12:Open] [N/A:N/A] Wound Status: [12:2.1x1.5x0.4] [N/A:N/A] Measurements L x W x D (cm) [12:2.474] [N/A:N/A] A (cm) : rea [12:0.99] [N/A:N/A] Volume (  cm) : [12:73.50%] [N/A:N/A] % Reduction in Area: [12:46.90%] [N/A:N/A] % Reduction in Volume: [12:Full Thickness Without Exposed] [N/A:N/A] Classification: [12:Support Structures Medium] [N/A:N/A] Exudate Amount: [12:Serosanguineous] [N/A:N/A] Exudate Type: [12:red, brown] [N/A:N/A] Exudate Color: [12:Thickened] [N/A:N/A] Wound Margin: [12:Large (67-100%)] [N/A:N/A] Granulation Amount: [12:Pink, Pale] [N/A:N/A] Granulation Quality: [12:None Present (0%)] [N/A:N/A] Necrotic Amount: [12:Fat Layer (Subcutaneous Tissue): Yes N/A] Exposed Structures: [12:Fascia: No Tendon: No Muscle: No Joint: No Bone: No Small (1-33%)] [N/A:N/A] Treatment Notes Electronic Signature(s) Signed: 09/29/2020 5:37:06 PM By: [name] MD Signed: 09/29/2020 5:57:05 PM By: Levan Hurst RN, BSN Entered By: [name] on 09/29/2020 11:40:19 -------------------------------------------------------------------------------- Multi-Disciplinary Care Plan Details Patient Name: Date of Service: [name], [name] 09/29/2020 10:15 A M Medical Record Number: [id number] Patient Account Number: [id number] Date of Birth/Sex: Treating RN: 1956/06/21 (64 y.o. [name] Primary Care Raydell Maners: [name] Other Clinician: Referring  Meilah Delrosario: Treating Jermani Eberlein/Extender: [name] in Treatment: 14 Active Inactive Wound/Skin Impairment Nursing Diagnoses: Impaired tissue integrity Knowledge deficit related to ulceration/compromised skin integrity Goals: Patient/caregiver will verbalize understanding of skin care regimen Date Initiated: 06/17/2020 Target Resolution Date: 10/24/2020 Goal Status: Active Ulcer/skin breakdown will have a volume reduction of 30% by week 4 Date Initiated: 06/17/2020 Date Inactivated: 07/21/2020 Target Resolution Date: 07/18/2020 Goal Status: Met Interventions: Assess patient/caregiver ability to obtain necessary supplies Assess patient/caregiver ability to perform ulcer/skin care regimen upon admission and as needed Assess ulceration(s) every visit Provide education on ulcer and skin care Notes: Electronic Signature(s) Signed: 09/29/2020 5:57:05 PM By: Levan Hurst RN, BSN Entered By: Levan Hurst on 09/29/2020 12:59:20 -------------------------------------------------------------------------------- Pain Assessment Details Patient Name: Date of Service: [name], [name] 09/29/2020 10:15 A M Medical Record Number: [id number] Patient Account Number: [id number] Date of Birth/Sex: Treating RN: 06-18-1956 (64 y.o. [name] Primary Care Lunden Mcleish: [name] Other Clinician: Referring Tahlia Deamer: Treating Oriya Kettering/Extender: [name] in Treatment: 14 Active Problems Location of Pain Severity and Description of Pain Patient Has Paino No Site Locations Rate the pain. Rate the pain. Current Pain Level: 0 Pain Management and Medication Current Pain Management: Electronic Signature(s) Signed: 09/29/2020 5:46:37 PM By: [name] RN, BSN Entered By: [name] on 09/29/2020 10:25:07 -------------------------------------------------------------------------------- Patient/Caregiver Education  Details Patient Name: Date of Service: [name], [name] 11/1/2021andnbsp10:15 A M Medical Record Number: [id number] Patient Account Number: [id number] Date of Birth/Gender: Treating RN: [birth year] (64 y.o. [name] Primary Care Physician: [name] Other Clinician: Referring Physician: Treating Physician/Extender: [name] in Treatment: 14 Education Assessment Education Provided To: Patient Education Topics Provided Wound/Skin Impairment: Methods: Explain/Verbal Responses: State content correctly [employer]) Signed: 09/29/2020 5:57:05 PM By: Levan Hurst RN, BSN Entered By: Levan Hurst on 09/29/2020 12:59:39 -------------------------------------------------------------------------------- Wound Assessment Details Patient Name: Date of Service: [name], [name] 09/29/2020 10:15 A M Medical Record Number: [id number] Patient Account Number: [id number] Date of Birth/Sex: Treating RN: 08-10-1956 (64 y.o. [name] Primary Care Judithann Villamar: [name] Other Clinician: Referring Tanijah Morais: Treating Macall Mccroskey/Extender: [name] in Treatment: 14 Wound Status Wound Number: 12 Primary Neuropathic Ulcer-Non Diabetic Etiology: Wound Location: Right Metatarsal head first Wound Open Wounding Event: Gradually Appeared Status: Date Acquired: 11/29/2018 Comorbid Anemia, Chronic Obstructive Pulmonary Disease (COPD), Weeks Of Treatment: 14 History: Hypertension, Osteoarthritis, Osteomyelitis, Neuropathy Clustered Wound: No Wound Measurements Length: (cm) 2.1 Width: (cm) 1.5 Depth: (cm) 0.4 Area: (cm) 2.474 Volume: (cm) 0.99 % Reduction in Area: 73.5% % Reduction  in Volume: 46.9% Epithelialization: Small (1-33%) Tunneling: No Undermining: No Wound Description Classification: Full Thickness Without Exposed Support Structures Wound Margin: Thickened Exudate Amount:  Medium Exudate Type: Serosanguineous Exudate Color: red, brown Foul Odor After Cleansing: No Slough/Fibrino No Wound Bed Granulation Amount: Large (67-100%) Exposed Structure Granulation Quality: Pink, Pale Fascia Exposed: No Necrotic Amount: None Present (0%) Fat Layer (Subcutaneous Tissue) Exposed: Yes Tendon Exposed: No Muscle Exposed: No Joint Exposed: No Bone Exposed: No Treatment Notes Wound #12 (Right Metatarsal head first) 1. Cleanse With Wound Cleanser 2. Periwound Care Skin Prep 3. Primary Dressing Applied Polymem Ag 4. Secondary Dressing Dry Gauze Roll Gauze Other secondary dressing (specify in notes) 5. Secured With Medipore tape 7. Footwear/Offloading device applied Felt/Foam Notes felt dressing medipore tape used to secure down along with medipore taped gauze over the felt and primary dressing to hold all in place. Electronic Signature(s) Signed: 09/29/2020 5:46:37 PM By: [name] RN, BSN Entered By: [name] on 09/29/2020 10:28:29 -------------------------------------------------------------------------------- Vitals Details Patient Name: Date of Service: [name], [name] 09/29/2020 10:15 A M Medical Record Number: [id number] Patient Account Number: [id number] Date of Birth/Sex: Treating RN: [birth year] (64 y.o. [name] Primary Care Lakendrick Paradis: [name] Other Clinician: Referring Jarvin Ogren: Treating Damyiah Moxley/Extender: [name] in Treatment: 14 Vital Signs Time Taken: 10:24 Temperature (F): 97.5 Height (in): 80 Pulse (bpm): 87 Source: Stated Respiratory Rate (breaths/min): 18 Weight (lbs): 350 Blood Pressure (mmHg): 148/75 Source: Stated Reference Range: 80 - 120 mg / dl Body Mass Index (BMI): 38.4 Electronic Signature(s) Signed: 09/29/2020 5:46:37 PM By: [name] RN, BSN Entered By: [name] on 09/29/2020 10:24:54

## 2020-10-01 DIAGNOSIS — G9009 Other idiopathic peripheral autonomic neuropathy: Secondary | ICD-10-CM | POA: Diagnosis not present

## 2020-10-01 DIAGNOSIS — N189 Chronic kidney disease, unspecified: Secondary | ICD-10-CM | POA: Diagnosis not present

## 2020-10-01 DIAGNOSIS — K5792 Diverticulitis of intestine, part unspecified, without perforation or abscess without bleeding: Secondary | ICD-10-CM | POA: Diagnosis not present

## 2020-10-01 DIAGNOSIS — T8744 Infection of amputation stump, left lower extremity: Secondary | ICD-10-CM | POA: Diagnosis not present

## 2020-10-01 DIAGNOSIS — L97518 Non-pressure chronic ulcer of other part of right foot with other specified severity: Secondary | ICD-10-CM | POA: Diagnosis not present

## 2020-10-01 DIAGNOSIS — M86672 Other chronic osteomyelitis, left ankle and foot: Secondary | ICD-10-CM | POA: Diagnosis not present

## 2020-10-01 DIAGNOSIS — I129 Hypertensive chronic kidney disease with stage 1 through stage 4 chronic kidney disease, or unspecified chronic kidney disease: Secondary | ICD-10-CM | POA: Diagnosis not present

## 2020-10-01 DIAGNOSIS — T8781 Dehiscence of amputation stump: Secondary | ICD-10-CM | POA: Diagnosis not present

## 2020-10-01 DIAGNOSIS — J449 Chronic obstructive pulmonary disease, unspecified: Secondary | ICD-10-CM | POA: Diagnosis not present

## 2020-10-02 ENCOUNTER — Ambulatory Visit (INDEPENDENT_AMBULATORY_CARE_PROVIDER_SITE_OTHER): Payer: Medicare PPO | Admitting: Physician Assistant

## 2020-10-02 ENCOUNTER — Encounter: Payer: Self-pay | Admitting: Orthopedic Surgery

## 2020-10-02 VITALS — Ht >= 80 in | Wt 369.0 lb

## 2020-10-02 DIAGNOSIS — Z89432 Acquired absence of left foot: Secondary | ICD-10-CM | POA: Diagnosis not present

## 2020-10-02 NOTE — Progress Notes (Signed)
Office Visit Note   Patient: [name].           Date of Birth: Apr 02, 1956           MRN: [id number] Visit Date: 10/02/2020              Requested by: [name], MD [address] [place],  [place] [address] PCP: [name], MD  Chief Complaint  Patient presents with  . Left Foot - Follow-up    05/30/20 left foot transmet amputation       HPI: Patient presents today 4 months status post left transmetatarsal amputation.  He has been weightbearing with crutches and a postop shoe.  He has been slow to heal over the medial aspect of the wound.  The rest of wound is healed quite nicely.  He has been using a nitroglycerin patch  Assessment & Plan: Visit Diagnoses: No diagnosis found.  Plan: Patient was seen today directly by Dr. Sharol Given.  At this point he is recommended a revision to the left transmetatarsal amputation we will plan for this next week  Follow-Up Instructions: No follow-ups on file.   Ortho Exam  Patient is alert, oriented, no adenopathy, well-dressed, normal affect, normal respiratory effort. Left foot he does have easily palpable pulses 85% of the incision is completely healed on the medial side he has skin maceration and some drainage no foul odor.  Does probe somewhat deeply.  No ascending cellulitis.  Moderate soft tissue swelling.  Heart regular rate and rhythm lungs clear  Imaging: No results found. No images are attached to the encounter.  Labs: Lab Results  Component Value Date   HGBA1C 6.0 (H) 08/23/2017   HGBA1C 5.3 03/12/2014   HGBA1C 5.4 07/04/2012   ESRSEDRATE 14 06/14/2017   ESRSEDRATE 30 05/17/2017   ESRSEDRATE 23 (H) 01/31/2015   CRP 23.4 (H) 06/14/2017   CRP 37.4 (H) 05/17/2017   CRP 55.8 (H) 03/29/2017   REPTSTATUS 09/17/2010 FINAL 08/03/2010   GRAMSTAIN  08/02/2010    FEW WBC PRESENT, PREDOMINANTLY PMN NO SQUAMOUS EPITHELIAL CELLS SEEN RARE GRAM NEGATIVE RODS   CULT NO ACID FAST BACILLI ISOLATED IN 6 WEEKS 08/03/2010       Lab Results  Component Value Date   ALBUMIN 4.0 03/12/2014   ALBUMIN 3.0 (L) 07/04/2012   ALBUMIN 1.7 (L) 08/02/2010   PREALBUMIN 4.3 (L) 08/02/2010    Lab Results  Component Value Date   MG 2.4 07/04/2012   MG 2.2 08/01/2010   No results found for: VD25OH  Lab Results  Component Value Date   PREALBUMIN 4.3 (L) 08/02/2010   CBC EXTENDED Latest Ref Rng & Units 05/30/2020 01/02/2018 01/01/2018  WBC 4.0 - 10.5 K/uL 9.0 12.4(H) 15.1(H)  RBC 4.22 - 5.81 MIL/uL 4.67 4.16(L) 4.09(L)  HGB 13.0 - 17.0 g/dL 13.6 12.5(L) 12.4(L)  HCT 39 - 52 % 43.3 38.4(L) 37.6(L)  PLT 150 - 400 K/uL 445(H) 223 232  NEUTROABS 1.40 - 7.00 x10E3/uL - - -  LYMPHSABS 0 - 3 x10E3/uL - - -     Body mass index is 40.54 kg/m.  Orders:  No orders of the defined types were placed in this encounter.  No orders of the defined types were placed in this encounter.    Procedures: No procedures performed  Clinical Data: No additional findings.  ROS:  All other systems negative, except as noted in the HPI. Review of Systems  Objective: Vital Signs: Ht 6\' 8"  (2.032 m)   Wt Marland Kitchen)  369 lb (167.4 kg)   BMI 40.54 kg/m   Specialty Comments:  No specialty comments available.  PMFS History: Patient Active Problem List   Diagnosis Date Noted  . Abscess of left foot 05/30/2020  . Subacute osteomyelitis, left ankle and foot ([place])   . Skin ulcers of both feet ([place]) 10/24/2017  . Colovesical fistula   . Benign neoplasm of cecum   . Chronic venous insufficiency 07/20/2017  . Chronic left-sided low back pain without sciatica 11/08/2016  . Toe ulcer ([place]) 03/08/2016  . Verruca 12/28/2015  . Pre-ulcerative calluses 12/28/2015  . History of adenomatous polyp of colon 06/09/2015  . Lipoma 03/13/2014  . Plantar warts 07/09/2012  . Cellulitis 07/04/2012  . Neuropathy 07/04/2012  . Hyperglycemia 07/04/2012  . HYPERTENSION, BENIGN 10/20/2010  . EDEMA 08/20/2010  . WISDOM TEETH EXTRACTION, HX OF 08/20/2010   . HYPOALBUMINEMIA 08/19/2010  . ANEMIA 08/19/2010  . NICOTINE ADDICTION 08/19/2010  . NECROTIZING PNEUMONIA 08/19/2010  . OSTEOARTHRITIS 08/19/2010   Past Medical History:  Diagnosis Date  . Arthritis   . Chronic kidney disease    mass r kidney - partial nephrectomy  . COPD (chronic obstructive pulmonary disease) ([facility])   . Diverticulitis   . History of kidney stones   . Hx of adenomatous colonic polyps 06/09/2015  . Hyperlipidemia   . Hypertension    has previously been on medication but then taken off - 05/29/20 pt states he's never been treated for HTN  . Neuromuscular disorder ([facility])    neuropathy lower legs and feet  . Neuropathy    in both feet  . Pneumonia 2011    Family History  Problem Relation Age of Onset  . Heart disease Mother   . Esophageal cancer Mother   . Diabetes Father   . Pancreatic cancer Father   . Breast cancer Sister   . Colon cancer Neg Hx   . Rectal cancer Neg Hx   . Stomach cancer Neg Hx   . Colon polyps Neg Hx     Past Surgical History:  Procedure Laterality Date  . AMPUTATION Bilateral 06/05/2013   Procedure: RIGHT GREAT TOE AMPUTATION/LEFT GREAT TOE DEBRIDEMENT;  Surgeon: Wylene Simmer, MD;  Location: [facility];  Service: Orthopedics;  Laterality: Bilateral;  . AMPUTATION Left 05/30/2020   Procedure: LEFT TRANSMETATARSAL AMPUTATION;  Surgeon: Newt Minion, MD;  Location: [facility];  Service: Orthopedics;  Laterality: Left;  . APPLICATION OF WOUND VAC Left 05/30/2020   Procedure: APPLICATION OF WOUND VAC;  Surgeon: Newt Minion, MD;  Location: [facility];  Service: Orthopedics;  Laterality: Left;  . COLON SURGERY     robotic partial colectomy Dr. Marcello Moores 12-30-17  . COLONOSCOPY     x 2 last date 09/21/2017 bladder attached to colon eval  . COLONOSCOPY WITH PROPOFOL N/A 09/21/2017   Procedure: COLONOSCOPY WITH PROPOFOL;  Surgeon: [name], MD;  [facility];  Service: Endoscopy;  Laterality: N/A;  . MOUTH SURGERY    . PARTIAL NEPHRECTOMY      right Dr. Leighton Ruff 0-9-98  . ROBOTIC ASSITED PARTIAL NEPHRECTOMY Right 12/30/2017   Procedure: XI ROBOTIC ASSITED RIGHT PARTIAL NEPHRECTOMY;  Surgeon: Alexis Frock, MD;  [facility]: [facility];  Service: Urology;  Laterality: Right;  . TOE AMPUTATION Right 06/05/2013   RIGHT GREAT TOE PARTIAL AMPUTATION    . TOOTH EXTRACTION  2011   multiple teeth extracted   Social History   Occupational History  . Occupation: retired  Tobacco Use  . Smoking  status: Current Some Day Smoker    Packs/day: 0.10    Years: 20.00    Pack years: 2.00    Types: Cigarettes    Start date: 11/29/1973  . Smokeless tobacco: Never Used  . Tobacco comment: occasional smoker - ~1 pack/week. smoked ~1.5ppd until 5 yr ago  Vaping Use  . Vaping Use: Never used  Substance and Sexual Activity  . Alcohol use: No    Alcohol/week: 0.0 standard drinks  . Drug use: No  . Sexual activity: Not Currently    Partners: Female

## 2020-10-06 ENCOUNTER — Other Ambulatory Visit: Payer: Self-pay

## 2020-10-07 ENCOUNTER — Other Ambulatory Visit: Payer: Self-pay | Admitting: Physician Assistant

## 2020-10-07 ENCOUNTER — Encounter (HOSPITAL_COMMUNITY): Payer: Self-pay | Admitting: Orthopedic Surgery

## 2020-10-07 ENCOUNTER — Other Ambulatory Visit (HOSPITAL_COMMUNITY)
Admission: RE | Admit: 2020-10-07 | Discharge: 2020-10-07 | Disposition: A | Discharge status: Home / Self Care | Payer: Medicare PPO | Source: Ambulatory Visit | Attending: Orthopedic Surgery | Admitting: Orthopedic Surgery

## 2020-10-07 ENCOUNTER — Other Ambulatory Visit: Payer: Self-pay

## 2020-10-07 DIAGNOSIS — Z01812 Encounter for preprocedural laboratory examination: Secondary | ICD-10-CM | POA: Insufficient documentation

## 2020-10-07 DIAGNOSIS — Z20822 Contact with and (suspected) exposure to covid-19: Secondary | ICD-10-CM | POA: Insufficient documentation

## 2020-10-07 DIAGNOSIS — G9009 Other idiopathic peripheral autonomic neuropathy: Secondary | ICD-10-CM | POA: Diagnosis not present

## 2020-10-07 DIAGNOSIS — N189 Chronic kidney disease, unspecified: Secondary | ICD-10-CM | POA: Diagnosis not present

## 2020-10-07 DIAGNOSIS — J449 Chronic obstructive pulmonary disease, unspecified: Secondary | ICD-10-CM | POA: Diagnosis not present

## 2020-10-07 DIAGNOSIS — M86672 Other chronic osteomyelitis, left ankle and foot: Secondary | ICD-10-CM | POA: Diagnosis not present

## 2020-10-07 DIAGNOSIS — T8744 Infection of amputation stump, left lower extremity: Secondary | ICD-10-CM | POA: Diagnosis not present

## 2020-10-07 DIAGNOSIS — L97518 Non-pressure chronic ulcer of other part of right foot with other specified severity: Secondary | ICD-10-CM | POA: Diagnosis not present

## 2020-10-07 DIAGNOSIS — I129 Hypertensive chronic kidney disease with stage 1 through stage 4 chronic kidney disease, or unspecified chronic kidney disease: Secondary | ICD-10-CM | POA: Diagnosis not present

## 2020-10-07 DIAGNOSIS — T8781 Dehiscence of amputation stump: Secondary | ICD-10-CM | POA: Diagnosis not present

## 2020-10-07 DIAGNOSIS — K5792 Diverticulitis of intestine, part unspecified, without perforation or abscess without bleeding: Secondary | ICD-10-CM | POA: Diagnosis not present

## 2020-10-07 LAB — SARS CORONAVIRUS 2 (TAT 6-24 HRS): SARS Coronavirus 2: NEGATIVE

## 2020-10-07 MED ORDER — DEXTROSE 5 % IV SOLN
3.0000 g | INTRAVENOUS | Status: DC
  Filled 2020-10-07: qty 3000

## 2020-10-07 NOTE — Progress Notes (Signed)
Mr. Russ denies chest pain or shortness of breath. Patient denies any s/s of Covid and has not been in contact with any one who has to his knowledge. Mr. Fedora will be tested for Covid today. Mr. Brue reports that he does not have anyone to stay with him at home after surgery, I left a voice message for [facility], Dr. Jess Barters scheduler.

## 2020-10-08 ENCOUNTER — Ambulatory Visit (HOSPITAL_COMMUNITY): Payer: Medicare PPO | Admitting: Anesthesiology

## 2020-10-08 ENCOUNTER — Encounter (HOSPITAL_COMMUNITY)
Admission: RE | Disposition: A | Discharge status: Home / Self Care | Payer: Self-pay | Source: Home / Self Care | Attending: Orthopedic Surgery

## 2020-10-08 ENCOUNTER — Observation Stay (HOSPITAL_COMMUNITY)
Admission: RE | Admit: 2020-10-08 | Discharge: 2020-10-09 | Disposition: A | Discharge status: Home / Self Care | Payer: Medicare PPO | Attending: Orthopedic Surgery | Admitting: Orthopedic Surgery

## 2020-10-08 ENCOUNTER — Other Ambulatory Visit: Payer: Self-pay

## 2020-10-08 ENCOUNTER — Encounter (HOSPITAL_COMMUNITY): Payer: Self-pay | Admitting: Orthopedic Surgery

## 2020-10-08 DIAGNOSIS — F17210 Nicotine dependence, cigarettes, uncomplicated: Secondary | ICD-10-CM | POA: Insufficient documentation

## 2020-10-08 DIAGNOSIS — D649 Anemia, unspecified: Secondary | ICD-10-CM | POA: Diagnosis not present

## 2020-10-08 DIAGNOSIS — T8781 Dehiscence of amputation stump: Secondary | ICD-10-CM | POA: Diagnosis not present

## 2020-10-08 DIAGNOSIS — Z85528 Personal history of other malignant neoplasm of kidney: Secondary | ICD-10-CM | POA: Insufficient documentation

## 2020-10-08 DIAGNOSIS — N189 Chronic kidney disease, unspecified: Secondary | ICD-10-CM | POA: Insufficient documentation

## 2020-10-08 DIAGNOSIS — J449 Chronic obstructive pulmonary disease, unspecified: Secondary | ICD-10-CM | POA: Diagnosis not present

## 2020-10-08 DIAGNOSIS — I10 Essential (primary) hypertension: Secondary | ICD-10-CM | POA: Diagnosis not present

## 2020-10-08 DIAGNOSIS — I129 Hypertensive chronic kidney disease with stage 1 through stage 4 chronic kidney disease, or unspecified chronic kidney disease: Secondary | ICD-10-CM | POA: Diagnosis not present

## 2020-10-08 DIAGNOSIS — Z86010 Personal history of colonic polyps: Secondary | ICD-10-CM | POA: Insufficient documentation

## 2020-10-08 DIAGNOSIS — L02612 Cutaneous abscess of left foot: Secondary | ICD-10-CM | POA: Diagnosis present

## 2020-10-08 HISTORY — DX: Malignant (primary) neoplasm, unspecified: C80.1

## 2020-10-08 HISTORY — PX: STUMP REVISION: SHX6102

## 2020-10-08 LAB — CBC
HCT: 46.9 % (ref 39.0–52.0)
Hemoglobin: 14.4 g/dL (ref 13.0–17.0)
MCH: 29.8 pg (ref 26.0–34.0)
MCHC: 30.7 g/dL (ref 30.0–36.0)
MCV: 96.9 fL (ref 80.0–100.0)
Platelets: 323 10*3/uL (ref 150–400)
RBC: 4.84 MIL/uL (ref 4.22–5.81)
RDW: 14.3 % (ref 11.5–15.5)
WBC: 8.7 10*3/uL (ref 4.0–10.5)
nRBC: 0 % (ref 0.0–0.2)

## 2020-10-08 LAB — BASIC METABOLIC PANEL
Anion gap: 10 (ref 5–15)
BUN: 12 mg/dL (ref 8–23)
CO2: 24 mmol/L (ref 22–32)
Calcium: 8.8 mg/dL — ABNORMAL LOW (ref 8.9–10.3)
Chloride: 105 mmol/L (ref 98–111)
Creatinine, Ser: 1.16 mg/dL (ref 0.61–1.24)
GFR, Estimated: >60 mL/min (ref >60)
Glucose, Bld: 103 mg/dL — ABNORMAL HIGH (ref 70–99)
Potassium: 4.1 mmol/L (ref 3.5–5.1)
Sodium: 139 mmol/L (ref 135–145)

## 2020-10-08 SURGERY — REVISION, AMPUTATION SITE
Anesthesia: Monitor Anesthesia Care | Site: Foot | Laterality: Left

## 2020-10-08 MED ORDER — OXYCODONE HCL 5 MG PO TABS
5.0000 mg | ORAL_TABLET | ORAL | Status: DC | PRN
  Administered 2020-10-08 – 2020-10-09 (×3): 10 mg via ORAL
  Filled 2020-10-08 (×3): qty 2

## 2020-10-08 MED ORDER — LIDOCAINE-EPINEPHRINE (PF) 1.5 %-1:200000 IJ SOLN
INTRAMUSCULAR | Status: DC | PRN
  Administered 2020-10-08: 10 mL via PERINEURAL

## 2020-10-08 MED ORDER — VARENICLINE TARTRATE 1 MG PO TABS
1.0000 mg | ORAL_TABLET | Freq: Two times a day (BID) | ORAL | Status: DC
  Administered 2020-10-09: 1 mg via ORAL
  Filled 2020-10-08 (×2): qty 1

## 2020-10-08 MED ORDER — MIDAZOLAM HCL 2 MG/2ML IJ SOLN: 2.0000 mg | Freq: Once | INTRAMUSCULAR | Status: AC

## 2020-10-08 MED ORDER — PROPOFOL 10 MG/ML IV BOLUS
INTRAVENOUS | Status: DC | PRN
  Administered 2020-10-08: 20 mg via INTRAVENOUS
  Administered 2020-10-08: 40 mg via INTRAVENOUS

## 2020-10-08 MED ORDER — LIDOCAINE 2% (20 MG/ML) 5 ML SYRINGE
INTRAMUSCULAR | Status: AC
  Filled 2020-10-08: qty 5

## 2020-10-08 MED ORDER — METOCLOPRAMIDE HCL 5 MG PO TABS: 5.0000 mg | ORAL_TABLET | Freq: Three times a day (TID) | ORAL | Status: DC | PRN

## 2020-10-08 MED ORDER — LACTATED RINGERS IV SOLN: INTRAVENOUS | Status: DC

## 2020-10-08 MED ORDER — ORAL CARE MOUTH RINSE: 15.0000 mL | Freq: Once | OROMUCOSAL | Status: AC

## 2020-10-08 MED ORDER — ACETAMINOPHEN 325 MG PO TABS: 325.0000 mg | ORAL_TABLET | Freq: Four times a day (QID) | ORAL | Status: DC | PRN

## 2020-10-08 MED ORDER — DOCUSATE SODIUM 100 MG PO CAPS
100.0000 mg | ORAL_CAPSULE | Freq: Two times a day (BID) | ORAL | Status: DC
  Administered 2020-10-08 – 2020-10-09 (×3): 100 mg via ORAL
  Filled 2020-10-08 (×3): qty 1

## 2020-10-08 MED ORDER — LIDOCAINE 2% (20 MG/ML) 5 ML SYRINGE
INTRAMUSCULAR | Status: DC | PRN
  Administered 2020-10-08: 40 mg via INTRAVENOUS

## 2020-10-08 MED ORDER — TAMSULOSIN HCL 0.4 MG PO CAPS
0.4000 mg | ORAL_CAPSULE | Freq: Two times a day (BID) | ORAL | Status: DC
  Administered 2020-10-08 – 2020-10-09 (×2): 0.4 mg via ORAL
  Filled 2020-10-08 (×2): qty 1

## 2020-10-08 MED ORDER — PREGABALIN 75 MG PO CAPS
150.0000 mg | ORAL_CAPSULE | Freq: Three times a day (TID) | ORAL | Status: DC
  Administered 2020-10-08 – 2020-10-09 (×3): 150 mg via ORAL
  Filled 2020-10-08 (×3): qty 2

## 2020-10-08 MED ORDER — MIDAZOLAM HCL 2 MG/2ML IJ SOLN
INTRAMUSCULAR | Status: AC
  Administered 2020-10-08: 2 mg via INTRAVENOUS
  Filled 2020-10-08: qty 2

## 2020-10-08 MED ORDER — DEXTROSE 5 % IV SOLN
INTRAVENOUS | Status: DC | PRN
  Administered 2020-10-08: 3 g via INTRAVENOUS

## 2020-10-08 MED ORDER — CHLORHEXIDINE GLUCONATE 0.12 % MT SOLN
15.0000 mL | Freq: Once | OROMUCOSAL | Status: AC
  Administered 2020-10-08: 15 mL via OROMUCOSAL
  Filled 2020-10-08: qty 15

## 2020-10-08 MED ORDER — ATORVASTATIN CALCIUM 10 MG PO TABS
10.0000 mg | ORAL_TABLET | Freq: Every day | ORAL | Status: DC
  Administered 2020-10-09: 10 mg via ORAL
  Filled 2020-10-08: qty 1

## 2020-10-08 MED ORDER — PROPOFOL 500 MG/50ML IV EMUL
INTRAVENOUS | Status: DC | PRN
  Administered 2020-10-08: 150 ug/kg/min via INTRAVENOUS

## 2020-10-08 MED ORDER — DULOXETINE HCL 60 MG PO CPEP
60.0000 mg | ORAL_CAPSULE | Freq: Every day | ORAL | Status: DC
  Administered 2020-10-09: 60 mg via ORAL
  Filled 2020-10-08: qty 1

## 2020-10-08 MED ORDER — FENTANYL CITRATE (PF) 100 MCG/2ML IJ SOLN: 100.0000 ug | Freq: Once | INTRAMUSCULAR | Status: AC

## 2020-10-08 MED ORDER — HYDROMORPHONE HCL 1 MG/ML IJ SOLN
0.5000 mg | INTRAMUSCULAR | Status: DC | PRN
  Administered 2020-10-08 – 2020-10-09 (×3): 0.5 mg via INTRAVENOUS
  Filled 2020-10-08 (×3): qty 0.5

## 2020-10-08 MED ORDER — MIDAZOLAM HCL 2 MG/2ML IJ SOLN
INTRAMUSCULAR | Status: AC
  Filled 2020-10-08: qty 2

## 2020-10-08 MED ORDER — METOCLOPRAMIDE HCL 5 MG/ML IJ SOLN: 5.0000 mg | Freq: Three times a day (TID) | INTRAMUSCULAR | Status: DC | PRN

## 2020-10-08 MED ORDER — ONDANSETRON HCL 4 MG/2ML IJ SOLN: 4.0000 mg | Freq: Four times a day (QID) | INTRAMUSCULAR | Status: DC | PRN

## 2020-10-08 MED ORDER — FENTANYL CITRATE (PF) 250 MCG/5ML IJ SOLN
INTRAMUSCULAR | Status: AC
  Filled 2020-10-08: qty 5

## 2020-10-08 MED ORDER — CEFAZOLIN SODIUM-DEXTROSE 2-4 GM/100ML-% IV SOLN
2.0000 g | Freq: Four times a day (QID) | INTRAVENOUS | Status: AC
  Administered 2020-10-08 – 2020-10-09 (×3): 2 g via INTRAVENOUS
  Filled 2020-10-08 (×3): qty 100

## 2020-10-08 MED ORDER — FENTANYL CITRATE (PF) 100 MCG/2ML IJ SOLN
INTRAMUSCULAR | Status: AC
  Administered 2020-10-08: 100 ug via INTRAVENOUS
  Filled 2020-10-08: qty 2

## 2020-10-08 MED ORDER — BUPIVACAINE-EPINEPHRINE (PF) 0.5% -1:200000 IJ SOLN
INTRAMUSCULAR | Status: DC | PRN
  Administered 2020-10-08: 20 mL via PERINEURAL

## 2020-10-08 MED ORDER — ONDANSETRON HCL 4 MG PO TABS: 4.0000 mg | ORAL_TABLET | Freq: Four times a day (QID) | ORAL | Status: DC | PRN

## 2020-10-08 MED ORDER — PROPOFOL 10 MG/ML IV BOLUS
INTRAVENOUS | Status: AC
  Filled 2020-10-08: qty 20

## 2020-10-08 MED ORDER — SODIUM CHLORIDE 0.9 % IV SOLN: INTRAVENOUS | Status: DC

## 2020-10-08 MED ORDER — ALBUTEROL SULFATE HFA 108 (90 BASE) MCG/ACT IN AERS
2.0000 | INHALATION_SPRAY | Freq: Four times a day (QID) | RESPIRATORY_TRACT | Status: DC | PRN
  Filled 2020-10-08: qty 6.7

## 2020-10-08 MED ORDER — MOMETASONE FURO-FORMOTEROL FUM 200-5 MCG/ACT IN AERO
2.0000 | INHALATION_SPRAY | Freq: Two times a day (BID) | RESPIRATORY_TRACT | Status: DC
  Administered 2020-10-08 – 2020-10-09 (×2): 2 via RESPIRATORY_TRACT
  Filled 2020-10-08: qty 8.8

## 2020-10-08 MED ORDER — SODIUM CHLORIDE 0.9 % IR SOLN
Status: DC | PRN
  Administered 2020-10-08: 1000 mL

## 2020-10-08 SURGICAL SUPPLY — 30 items
BLADE SAW RECIP 87.9 MT (BLADE) ×3 IMPLANT
BLADE SURG 21 STRL SS (BLADE) ×3 IMPLANT
COVER SURGICAL LIGHT HANDLE (MISCELLANEOUS) ×3 IMPLANT
DRAPE DERMATAC (DRAPES) ×3 IMPLANT
DRAPE EXTREMITY T 121X128X90 (DISPOSABLE) ×3 IMPLANT
DRAPE HALF SHEET 40X57 (DRAPES) ×3 IMPLANT
DRAPE U-SHAPE 47X51 STRL (DRAPES) ×6 IMPLANT
DRESSING PREVENA PLUS CUSTOM (GAUZE/BANDAGES/DRESSINGS) ×1 IMPLANT
DRSG PREVENA PLUS CUSTOM (GAUZE/BANDAGES/DRESSINGS) ×3
DURAPREP 26ML APPLICATOR (WOUND CARE) ×3 IMPLANT
ELECT REM PT RETURN 9FT ADLT (ELECTROSURGICAL) ×3
ELECTRODE REM PT RTRN 9FT ADLT (ELECTROSURGICAL) ×1 IMPLANT
GLOVE BIOGEL PI IND STRL 9 (GLOVE) ×1 IMPLANT
GLOVE BIOGEL PI INDICATOR 9 (GLOVE) ×2
GLOVE SURG ORTHO 9.0 STRL STRW (GLOVE) ×3 IMPLANT
GOWN STRL REUS W/ TWL XL LVL3 (GOWN DISPOSABLE) ×2 IMPLANT
GOWN STRL REUS W/TWL XL LVL3 (GOWN DISPOSABLE) ×6
KIT BASIN OR (CUSTOM PROCEDURE TRAY) ×3 IMPLANT
KIT DRSG PREVENA PLUS 7DAY 125 (MISCELLANEOUS) ×3 IMPLANT
KIT PREVENA INCISION MGT 13 (CANNISTER) ×3 IMPLANT
KIT TURNOVER KIT B (KITS) ×3 IMPLANT
MANIFOLD NEPTUNE II (INSTRUMENTS) ×3 IMPLANT
NS IRRIG 1000ML POUR BTL (IV SOLUTION) ×3 IMPLANT
PACK GENERAL/GYN (CUSTOM PROCEDURE TRAY) ×3 IMPLANT
PAD ARMBOARD 7.5X6 YLW CONV (MISCELLANEOUS) ×3 IMPLANT
PREVENA RESTOR ARTHOFORM 46X30 (CANNISTER) ×3 IMPLANT
SUT ETHILON 2 0 PSLX (SUTURE) ×6 IMPLANT
SUT SILK 2 0 (SUTURE)
SUT SILK 2-0 18XBRD TIE 12 (SUTURE) IMPLANT
TOWEL GREEN STERILE (TOWEL DISPOSABLE) ×3 IMPLANT

## 2020-10-08 NOTE — Progress Notes (Signed)
Orthopedic Tech Progress Note Patient Details:  Riggins Cisek [birth year] [id number] PACU RN called requesting a CAM WALKER BOOT. Ortho Devices Type of Ortho Device: CAM walker Ortho Device/Splint Location: LLE Ortho Device/Splint Interventions: Ordered, Application, Adjustment   Post Interventions Patient Tolerated: Well Instructions Provided: Care of device   Janit Pagan 10/08/2020, 11:58 AM

## 2020-10-08 NOTE — Progress Notes (Signed)
Dr. Sharol Given aware that patient does not have a ride home or anyone to stay with him at home.  Per Dr. Sharol Given, patient will stay overnight.

## 2020-10-08 NOTE — H&P (Signed)
[name]. is an 64 y.o. male.   Chief Complaint: Left Foot wound dehiscence HPI: Patient presents today 4 months status post left transmetatarsal amputation.  He has been weightbearing with crutches and a postop shoe.  He has been slow to heal over the medial aspect of the wound.  The rest of wound is healed quite nicely.  He has been using a nitroglycerin patch  Past Medical History:  Diagnosis Date  . Arthritis   . Cancer ([facility])    kidney  . Chronic kidney disease    mass r kidney - partial nephrectomy  . COPD (chronic obstructive pulmonary disease) ([place])   . Diverticulitis   . History of kidney stones   . Hx of adenomatous colonic polyps 06/09/2015  . Hyperlipidemia   . Hypertension    has previously been on medication but then taken off - 05/29/20 pt states he's never been treated for HTN  . Neuromuscular disorder (H[facility])    neuropathy lower legs and feet  . Neuropathy    in both feet  . Pneumonia 2011    Past Surgical History:  Procedure Laterality Date  . AMPUTATION Bilateral 06/05/2013   Procedure: RIGHT GREAT TOE AMPUTATION/LEFT GREAT TOE DEBRIDEMENT;  Surgeon: Wylene Simmer, MD;  Location: [facility];  Service: Orthopedics;  Laterality: Bilateral;  . AMPUTATION Left 05/30/2020   Procedure: LEFT TRANSMETATARSAL AMPUTATION;  Surgeon: Newt Minion, MD;  Location: [facility];  Service: Orthopedics;  Laterality: Left;  . APPLICATION OF WOUND VAC Left 05/30/2020   Procedure: APPLICATION OF WOUND VAC;  Surgeon: Newt Minion, MD;  Location: [facility];  Service: Orthopedics;  Laterality: Left;  . COLON SURGERY     robotic partial colectomy Dr. Marcello Moores 12-30-17  . COLONOSCOPY     x 2 last date 09/21/2017 bladder attached to colon eval  . COLONOSCOPY WITH PROPOFOL N/A 09/21/2017   Procedure: COLONOSCOPY WITH PROPOFOL;  Surgeon: [name], MD;  [facility];  Service: Endoscopy;  Laterality: N/A;  . MOUTH SURGERY    . PARTIAL NEPHRECTOMY     right Dr. Leighton Ruff 03-31-96  .  ROBOTIC ASSITED PARTIAL NEPHRECTOMY Right 12/30/2017   Procedure: XI ROBOTIC ASSITED RIGHT PARTIAL NEPHRECTOMY;  Surgeon: Alexis Frock, MD;  [facility];  Service: Urology;  Laterality: Right;  . TOE AMPUTATION Right 06/05/2013   RIGHT GREAT TOE PARTIAL AMPUTATION    . TOOTH EXTRACTION  2011   multiple teeth extracted    Family History  Problem Relation Age of Onset  . Heart disease Mother   . Esophageal cancer Mother   . Diabetes Father   . Pancreatic cancer Father   . Breast cancer Sister   . Colon cancer Neg Hx   . Rectal cancer Neg Hx   . Stomach cancer Neg Hx   . Colon polyps Neg Hx    Social History:  reports that he has been smoking cigarettes. He started smoking about 46 years ago. He has a 8.60 pack-year smoking history. He has never used smokeless tobacco. He reports that he does not drink alcohol and does not use drugs.  Allergies: No Known Allergies  No medications prior to admission.    Results for orders placed or performed during the hospital encounter of 10/07/20 (from the past 48 hour(s))  SARS CORONAVIRUS 2 (TAT 6-24 HRS) Nasopharyngeal Nasopharyngeal Swab     Status: None   Collection Time: 10/07/20 12:17 PM   Specimen: Nasopharyngeal Swab  Result Value Ref Range  SARS Coronavirus 2 NEGATIVE NEGATIVE    Comment: (NOTE) SARS-CoV-2 target nucleic acids are NOT DETECTED.  The SARS-CoV-2 RNA is generally detectable in upper and lower respiratory specimens during the acute phase of infection. Negative results do not preclude SARS-CoV-2 infection, do not rule out co-infections with other pathogens, and should not be used as the sole basis for treatment or other patient management decisions. Negative results must be combined with clinical observations, patient history, and epidemiological information. The expected result is Negative.  Fact Sheet for Patients: SugarRoll.be  Fact Sheet for Healthcare  Providers: https://www.woods-mathews.com/  This test is not yet approved or cleared by the [place] FDA and  has been authorized for detection and/or diagnosis of SARS-CoV-2 by FDA under an Emergency Use Authorization (EUA). This EUA will remain  in effect (meaning this test can be used) for the duration of the COVID-19 declaration under Se ction 564(b)(1) of the Act, 21 U.S.C. section 360bbb-3(b)(1), unless the authorization is terminated or revoked sooner.  Performed at [facility], [place] [address] [place], [place] [address]    No results found.  Review of Systems  All other systems reviewed and are negative.   There were no vitals taken for this visit. Physical Exam  Patient is alert, oriented, no adenopathy, well-dressed, normal affect, normal respiratory effort. Left foot he does have easily palpable pulses 85% of the incision is completely healed on the medial side he has skin maceration and some drainage no foul odor.  Does probe somewhat deeply.  No ascending cellulitis.  Moderate soft tissue swelling.  Heart regular rate and rhythm lungs clear Assessment/Plan Plan: Patient was seen today directly by Dr. Sharol Given.  At this point he is recommended a revision to the left transmetatarsal amputation we will plan for this next week  [place], PA 10/08/2020, 6:35 AM

## 2020-10-08 NOTE — Transfer of Care (Signed)
Immediate Anesthesia Transfer of Care Note  Patient: [name].  Procedure(s) Performed: REVISION LEFT TRANSMETATARSAL AMPUTATION (Left Foot)  Patient Location: PACU  Anesthesia Type:MAC combined with regional for post-op pain  Level of Consciousness: awake, alert  and oriented  Airway & Oxygen Therapy: Patient Spontanous Breathing and Patient connected to face mask oxygen  Post-op Assessment: Report given to RN, Post -op Vital signs reviewed and stable and Patient moving all extremities X 4  Post vital signs: Reviewed and stable  Last Vitals:  Vitals Value Taken Time  BP 120/67   Temp    Pulse 73 10/08/20 1106  Resp 19 10/08/20 1106  SpO2 100 % 10/08/20 1106  Vitals shown include unvalidated device data.  Last Pain:  Vitals:   10/08/20 0930  TempSrc:   PainSc: 0-No pain      Patients Stated Pain Goal: 2 (59/47/07 6151)  Complications: No complications documented.

## 2020-10-08 NOTE — Anesthesia Procedure Notes (Signed)
Anesthesia Regional Block: Popliteal block   Pre-Anesthetic Checklist: ,, timeout performed, Correct Patient, Correct Site, Correct Laterality, Correct Procedure, Correct Position, site marked, Risks and benefits discussed,  Surgical consent,  Pre-op evaluation,  At surgeon's request and post-op pain management  Laterality: Left and Lower  Prep: chloraprep       Needles:  Injection technique: Single-shot     Needle Length: 9cm  Needle Gauge: 22     Additional Needles: Arrow StimuQuik ECHO Echogenic Stimulating PNB Needle  Procedures:,,,, ultrasound used (permanent image in chart),,,,  Narrative:  Start time: 10/08/2020 10:20 AM End time: 10/08/2020 10:24 AM Injection made incrementally with aspirations every 5 mL.  Performed by: Personally  Anesthesiologist: Oleta Mouse, MD

## 2020-10-08 NOTE — Anesthesia Preprocedure Evaluation (Addendum)
Anesthesia Evaluation  Patient identified by MRN, date of birth, ID band Patient awake    Reviewed: Allergy & Precautions, NPO status , Patient's Chart, lab work & pertinent test results  History of Anesthesia Complications Negative for: history of anesthetic complications  Airway Mallampati: I  TM Distance: >3 FB Neck ROM: Full    Dental  (+) Edentulous Upper, Edentulous Lower   Pulmonary COPD,  COPD inhaler, Current Smoker and Patient abstained from smoking.,  Covid-19 Nucleic Acid Test Results Lab Results      Component                Value               Date                      SARSCOV2NAA              NEGATIVE            10/07/2020                [place]              NEGATIVE            05/29/2020              breath sounds clear to auscultation       Cardiovascular hypertension,  Rhythm:Regular Rate:Normal     Neuro/Psych  Neuromuscular disease negative psych ROS   GI/Hepatic negative GI ROS, Neg liver ROS,   Endo/Other  negative endocrine ROS  Renal/GU Renal diseaseLab Results      Component                Value               Date                      CREATININE               1.19                05/30/2020                Musculoskeletal  (+) Arthritis , Dehiscence Left Transmetatarsal Amputation   Abdominal Normal abdominal exam  (+)   Peds  Hematology negative hematology ROS (+) Lab Results      Component                Value               Date                      WBC                      9.0                 05/30/2020                HGB                      13.6                05/30/2020                HCT                      43.3  05/30/2020                MCV                      92.7                05/30/2020                PLT                      445 (H)             05/30/2020               Anesthesia Other Findings   Reproductive/Obstetrics                             Anesthesia Physical Anesthesia Plan  ASA: III  Anesthesia Plan: MAC and Regional   Post-op Pain Management:    Induction: Intravenous  PONV Risk Score and Plan: 0 and Treatment may vary due to age or medical condition and Propofol infusion  Airway Management Planned: Nasal Cannula  Additional Equipment: None  Intra-op Plan:   Post-operative Plan:   Informed Consent: I have reviewed the patients History and Physical, chart, labs and discussed the procedure including the risks, benefits and alternatives for the proposed anesthesia with the patient or authorized representative who has indicated his/her understanding and acceptance.     Dental advisory given  Plan Discussed with: CRNA and Surgeon  Anesthesia Plan Comments:         Anesthesia Quick Evaluation

## 2020-10-08 NOTE — Anesthesia Postprocedure Evaluation (Signed)
Anesthesia Post Note  Patient: [name].  Procedure(s) Performed: REVISION LEFT TRANSMETATARSAL AMPUTATION (Left Foot)     Patient location during evaluation: PACU Anesthesia Type: Regional and MAC Level of consciousness: awake and alert Pain management: pain level controlled Vital Signs Assessment: post-procedure vital signs reviewed and stable Respiratory status: spontaneous breathing, nonlabored ventilation, respiratory function stable and patient connected to nasal cannula oxygen Cardiovascular status: stable and blood pressure returned to baseline Postop Assessment: no apparent nausea or vomiting Anesthetic complications: no   No complications documented.  Last Vitals:  Vitals:   10/08/20 1330 10/08/20 1420  BP: 130/85 132/74  Pulse: 73 75  Resp: 15 17  Temp: 37.1 C (!) 36.3 C  SpO2: 100% 98%    Last Pain:  Vitals:   10/08/20 1420  TempSrc: Oral  PainSc:                  Kasch Borquez

## 2020-10-08 NOTE — Op Note (Signed)
10/08/2020  11:16 AM  PATIENT:  [name].    PRE-OPERATIVE DIAGNOSIS:  Dehiscence Left Transmetatarsal Amputation  POST-OPERATIVE DIAGNOSIS:  Same  PROCEDURE:  REVISION LEFT TRANSMETATARSAL AMPUTATION  SURGEON:  Newt Minion, MD  PHYSICIAN ASSISTANT:None ANESTHESIA:   General  PREOPERATIVE INDICATIONS:  [name]. is a  64 y.o. male with a diagnosis of Dehiscence Left Transmetatarsal Amputation who failed conservative measures and elected for surgical management.    The risks benefits and alternatives were discussed with the patient preoperatively including but not limited to the risks of infection, bleeding, nerve injury, cardiopulmonary complications, the need for revision surgery, among others, and the patient was willing to proceed.  OPERATIVE IMPLANTS: Praveena 13 cm wound VAC  @ENCIMAGES @  OPERATIVE FINDINGS: Calcified vessels no deep abscess or osteomyelitis at the resection margins.  OPERATIVE PROCEDURE: Patient was brought the operating room after undergoing a regional anesthetic.  After adequate levels anesthesia obtained patient's left lower extremity was prepped using DuraPrep draped into a sterile field a timeout was called.  A fishmouth incision was made just proximal to the wound dehiscence this was carried sharply down to bone a reciprocating saw was used to perform a revision of the transmetatarsal amputation closer to the base of the metatarsals more consistent with a Chopart amputation.  Patient extremely calcified vessels electrocautery was used hemostasis the wound was irrigated with normal saline the wound margins were clear.  The incision was closed using 2-0 nylon a Prevena wound VAC was applied this was covered with derma tack patient was then covered with Covan this had a good suction fit patient was taken the PACU in stable condition   DISCHARGE PLANNING:  Antibiotic duration: 24 hours  Weightbearing: Nonweightbearing on the  left  Pain medication: Opioid pathway  Dressing care/ Wound VAC: Continue wound VAC at discharge  Ambulatory devices: Walker  Discharge to: Plan for overnight observation and discharged home.  Follow-up: In the office 1 week post operative.

## 2020-10-08 NOTE — Evaluation (Signed)
Occupational Therapy Evaluation Patient Details Name: [name]. MRN: [id number] DOB: [birth year] Today's Date: 10/08/2020    History of Present Illness 64 y.o. male with a diagnosis of Dehiscence Left Transmetatarsal Amputation who failed conservative measures and elected for surgical management.  Underwent REVISION LEFT TRANSMETATARSAL AMPUTATION 11/10.   Clinical Impression   Patient admitted for the above diagnosis and procedure.  Patient presents with poor stand balance, difficulty maintaining NWB and impulsivity.  At home he lived alone, drove and needed no assist with ADL/IADL, but walked with a cane and crutches.  He plans on discharging home, [facility] PT/OT would assist with a safe discharge home given his decreased safety and that he moves too fast.  OT will follow in the acute setting.      Follow Up Recommendations  Home health OT    Equipment Recommendations       Recommendations for Other Services       Precautions / Restrictions Precautions Precautions: Fall Restrictions Weight Bearing Restrictions: Yes LLE Weight Bearing: Non weight bearing Other Position/Activity Restrictions: CAM walker      Mobility Bed Mobility Overal bed mobility: Modified Independent                  Transfers Overall transfer level: Needs assistance   Transfers: Sit to/from Stand;Stand Pivot Transfers Sit to Stand: Supervision Stand pivot transfers: Supervision       General transfer comment: Not doing well with NWB    Balance Overall balance assessment: Needs assistance Sitting-balance support: No upper extremity supported Sitting balance-Leahy Scale: Good     Standing balance support: Bilateral upper extremity supported Standing balance-Leahy Scale: Poor                             ADL either performed or assessed with clinical judgement   ADL Overall ADL's : Needs assistance/impaired Eating/Feeding: Independent;Sitting   Grooming: Wash/dry  hands;Wash/dry face;Set up;Sitting           Upper Body Dressing : Set up;Sitting   Lower Body Dressing: Supervision/safety;Sit to/from stand   Toilet Transfer: Supervision/safety;RW   Toileting- Water quality scientist and Hygiene: Supervision/safety;Sit to/from stand       Functional mobility during ADLs: Supervision/safety;Cueing for safety;Cueing for sequencing;Rolling walker       Vision Baseline Vision/History: Wears glasses Wears Glasses: Reading only Patient Visual Report: No change from baseline       Perception     Praxis      Pertinent Vitals/Pain Pain Assessment: No/denies pain     Hand Dominance Right   Extremity/Trunk Assessment Upper Extremity Assessment Upper Extremity Assessment: Overall WFL for tasks assessed   Lower Extremity Assessment Lower Extremity Assessment: Defer to PT evaluation       Communication Communication Communication: No difficulties   Cognition Arousal/Alertness: Awake/alert Behavior During Therapy: Impulsive Overall Cognitive Status: Within Functional Limits for tasks assessed                                 General Comments: Impulsive, not adhereing to NWB with RW.  Heavey VC's for RW management.   General Comments       Exercises     Shoulder Instructions      Home Living Family/patient expects to be discharged to:: Private residence Living Arrangements: Alone Available Help at Discharge: Friend(s);Available PRN/intermittently Type of Home: House Home Access: Stairs to enter Entrance  Stairs-Number of Steps: 2   Home Layout: One level     Bathroom Shower/Tub: Occupational psychologist: Handicapped height     Home Equipment: Garment/textile technologist - single point          Prior Functioning/Environment Level of Independence: Independent with assistive device(s)        Comments: ambulates with use of a cane at baseline, otherwise mod I with BADLs and ADL.  Drove.  No assist  with meds, home management or meals.        OT Problem List: Impaired balance (sitting and/or standing)      OT Treatment/Interventions: Self-care/ADL training;Therapeutic activities    OT Goals(Current goals can be found in the care plan section) Acute Rehab OT Goals Patient Stated Goal: Go home and use my crutches OT Goal Formulation: With patient Time For Goal Achievement: 10/22/20 Potential to Achieve Goals: Good ADL Goals Pt Will Perform Grooming: with modified independence;standing Pt Will Transfer to Toilet: with modified independence;ambulating;regular height toilet  OT Frequency: Min 2X/week   Barriers to D/C:    Decreased safety and poor adherance to NWB       Co-evaluation              AM-PAC OT "6 Clicks" Daily Activity     Outcome Measure Help from another person eating meals?: None Help from another person taking care of personal grooming?: None Help from another person toileting, which includes using toliet, bedpan, or urinal?: A Little Help from another person bathing (including washing, rinsing, drying)?: A Little Help from another person to put on and taking off regular upper body clothing?: None Help from another person to put on and taking off regular lower body clothing?: A Little 6 Click Score: 21   End of Session Equipment Utilized During Treatment: Rolling walker;Other (comment) (CAM walker) Nurse Communication: Mobility status  Activity Tolerance: Patient tolerated treatment well Patient left: in bed;with call bell/phone within reach  OT Visit Diagnosis: Unsteadiness on feet (R26.81)                Time: 2355-7322 OT Time Calculation (min): 22 min Charges:  OT General Charges $OT Visit: 1 Visit OT Evaluation $OT Eval Moderate Complexity: 1 Mod  10/08/2020  [name], OTR/L  Acute Rehabilitation Services  Office:  [phone]   [name] 10/08/2020, 5:51 PM

## 2020-10-08 NOTE — Anesthesia Procedure Notes (Signed)
Procedure Name: MAC Date/Time: 10/08/2020 10:30 AM Performed by: Rande Brunt, CRNA Pre-anesthesia Checklist: Patient identified, Emergency Drugs available, Suction available and Patient being monitored Patient Re-evaluated:Patient Re-evaluated prior to induction Oxygen Delivery Method: Simple face mask Preoxygenation: Pre-oxygenation with 100% oxygen Induction Type: IV induction Placement Confirmation: positive ETCO2 and breath sounds checked- equal and bilateral Dental Injury: Teeth and Oropharynx as per pre-operative assessment

## 2020-10-09 ENCOUNTER — Encounter (HOSPITAL_COMMUNITY): Payer: Self-pay | Admitting: Orthopedic Surgery

## 2020-10-09 DIAGNOSIS — L97518 Non-pressure chronic ulcer of other part of right foot with other specified severity: Secondary | ICD-10-CM | POA: Diagnosis not present

## 2020-10-09 DIAGNOSIS — K5792 Diverticulitis of intestine, part unspecified, without perforation or abscess without bleeding: Secondary | ICD-10-CM | POA: Diagnosis not present

## 2020-10-09 DIAGNOSIS — T8744 Infection of amputation stump, left lower extremity: Secondary | ICD-10-CM | POA: Diagnosis not present

## 2020-10-09 DIAGNOSIS — Z86010 Personal history of colonic polyps: Secondary | ICD-10-CM | POA: Diagnosis not present

## 2020-10-09 DIAGNOSIS — I129 Hypertensive chronic kidney disease with stage 1 through stage 4 chronic kidney disease, or unspecified chronic kidney disease: Secondary | ICD-10-CM | POA: Diagnosis not present

## 2020-10-09 DIAGNOSIS — J449 Chronic obstructive pulmonary disease, unspecified: Secondary | ICD-10-CM | POA: Diagnosis not present

## 2020-10-09 DIAGNOSIS — M86672 Other chronic osteomyelitis, left ankle and foot: Secondary | ICD-10-CM | POA: Diagnosis not present

## 2020-10-09 DIAGNOSIS — Z85528 Personal history of other malignant neoplasm of kidney: Secondary | ICD-10-CM | POA: Diagnosis not present

## 2020-10-09 DIAGNOSIS — N189 Chronic kidney disease, unspecified: Secondary | ICD-10-CM | POA: Diagnosis not present

## 2020-10-09 DIAGNOSIS — T8781 Dehiscence of amputation stump: Secondary | ICD-10-CM | POA: Diagnosis not present

## 2020-10-09 DIAGNOSIS — G9009 Other idiopathic peripheral autonomic neuropathy: Secondary | ICD-10-CM | POA: Diagnosis not present

## 2020-10-09 DIAGNOSIS — F17210 Nicotine dependence, cigarettes, uncomplicated: Secondary | ICD-10-CM | POA: Diagnosis not present

## 2020-10-09 MED ORDER — OXYCODONE HCL 5 MG PO TABS
5.0000 mg | ORAL_TABLET | ORAL | 0 refills | Status: DC | PRN
Start: 2020-10-09 — End: 2020-10-14

## 2020-10-09 NOTE — Care Management Obs Status (Signed)
[place] NOTIFICATION   Patient Details  Name: [name]. MRN: [id number] Date of Birth: October 28, 1956   Medicare Observation Status Notification Given:  Yes    Curlene Labrum, RN 10/09/2020, 9:21 AM

## 2020-10-09 NOTE — TOC Initial Note (Signed)
Transition of Care (TOC) - Initial/Assessment Note    Patient Details  Name: [name]. MRN: [id number] Date of Birth: [birth year]  Transition of Care [facility]) CM/SW Contact:    Curlene Labrum, RN Phone Number: 10/09/2020, 10:57 AM  Clinical Narrative:                 Case management met with the patient at the bedside regarding transitions of care to home today.  The patient lives by himself but has available supports through family and friends.  The patient plans to drive home today by car.  The patient states that he has needed dme at home but his 3:1 is missing pieces to function as a safe shower chair.  I called [employer] and ordered a bariatric 3:1 to be delivered to his room prior to his discharge home today.  The patient was given Medicare observation notice and verbalizes understanding and is discharging today after his equipment is delivered.  The patient was given Medicare choice regarding home health services and is currently receiving [facility] for RN, PT, OT and would like continued services after discharge today.  I called [facility] and spoke with Tommi Rumps and they will continue to provide home health RN, PT, and OT services.  Orders for home health and dme placed to be co-signed.  The patient will be ready for discharge once his 3:1 is delivered to the hospital room.  Expected Discharge Plan: [place] Barriers to Discharge: No Barriers Identified   Patient Goals and CMS Choice Patient states their goals for this hospitalization and ongoing recovery are:: Patient plans to discharge home today after 3:1 delivered to hospital room. CMS Medicare.gov Compare Post Acute Care list provided to:: Patient Choice offered to / list presented to : Patient  Expected Discharge Plan and Services Expected Discharge Plan: [place]   Discharge Planning Services: CM Consult Post Acute Care Choice: Durable Medical Equipment, Home Health Living  arrangements for the past 2 months: Single Family Home Expected Discharge Date: 10/09/20               DME Arranged: 3-N-1 (bariatric 3:1 to be delivered to the patient's room.) DME Agency: [employer] Date DME Agency Contacted: 10/09/20 Time DME Agency Contacted: [id number] Representative spoke with at DME Agency: CM with [employer] HH Arranged: RN, PT, OT [place] Agency: [place] Date [place]: 10/09/20 Time [place]: [id number] Representative spoke with at [place]: Tommi Rumps, [place]  Prior Living Arrangements/Services Living arrangements for the past 2 months: [place] with:: Self (family and friends assisting patient at home as needed) Patient language and need for interpreter reviewed:: Yes Do you feel safe going back to the place where you live?: Yes      Need for Family Participation in Patient Care: Yes (Comment) Care giver support system in place?: Yes (comment) Current home services: DME (has RW, cane, crutches, but shower seat is missing rubber stoppers on legs and is safety issue) Criminal Activity/Legal Involvement Pertinent to Current Situation/Hospitalization: No - Comment as needed  Activities of Daily Living Home Assistive Devices/Equipment: Cane (specify quad or straight), Crutches ADL Screening (condition at time of admission) Patient's cognitive ability adequate to safely complete daily activities?: Yes Is the patient deaf or have difficulty hearing?: No Does the patient have difficulty seeing, even when wearing glasses/contacts?: No Does the patient have difficulty concentrating, remembering, or making decisions?: No Patient able to express need  for assistance with ADLs?: No Does the patient have difficulty dressing or bathing?: No Independently performs ADLs?: Yes (appropriate for developmental age) Does the patient have difficulty walking or climbing stairs?: Yes Weakness of Legs: Both Weakness of Arms/Hands: None  Permission  Sought/Granted Permission sought to share information with : Case Manager, PCP Permission granted to share information with : Yes, Verbal Permission Granted     Permission granted to share info w AGENCY: [place], [employer]        Emotional Assessment Appearance:: Appears stated age Attitude/Demeanor/Rapport: Gracious Affect (typically observed): Accepting Orientation: : Oriented to Self, Oriented to Place, Oriented to  Time, Oriented to Situation Alcohol / Substance Use: Not Applicable Psych Involvement: No (comment)  Admission diagnosis:  Abscess of left foot [L02.612] Patient Active Problem List   Diagnosis Date Noted  . Dehiscence of amputation stump ([place])   . Abscess of left foot 05/30/2020  . Subacute osteomyelitis, left ankle and foot ([place])   . Skin ulcers of both feet ([place]) 10/24/2017  . Colovesical fistula   . Benign neoplasm of cecum   . Chronic venous insufficiency 07/20/2017  . Chronic left-sided low back pain without sciatica 11/08/2016  . Toe ulcer ([place]) 03/08/2016  . [place]ca 12/28/2015  . Pre-ulcerative calluses 12/28/2015  . History of adenomatous polyp of colon 06/09/2015  . Lipoma 03/13/2014  . Plantar warts 07/09/2012  . Cellulitis 07/04/2012  . Neuropathy 07/04/2012  . Hyperglycemia 07/04/2012  . HYPERTENSION, BENIGN 10/20/2010  . EDEMA 08/20/2010  . WISDOM TEETH EXTRACTION, HX OF 08/20/2010  . HYPOALBUMINEMIA 08/19/2010  . ANEMIA 08/19/2010  . NICOTINE ADDICTION 08/19/2010  . NECROTIZING PNEUMONIA 08/19/2010  . OSTEOARTHRITIS 08/19/2010   PCP:  [name], MD Pharmacy:   [id number] [id number]: [phone]: [phone]    Social Determinants of Health (SDOH) Interventions    Readmission Risk Interventions No flowsheet data found.

## 2020-10-09 NOTE — Progress Notes (Signed)
Occupational Therapy Treatment Patient Details Name: [name]. MRN: [id number] DOB: [birth year] Today's Date: 10/09/2020    History of present illness 64 y.o. male with a diagnosis of Dehiscence Left Transmetatarsal Amputation who failed conservative measures and elected for surgical management.  Underwent REVISION LEFT TRANSMETATARSAL AMPUTATION 11/10.   OT comments  Pt progressing towards OT goals, but continues to have difficulty maintaining NWB precautions with decreased safety awareness. Pt reports plan to use cane to get into car after discharge today - Attempted to explain that this would not be adhering to NWB precautions and recommended use of RW. However, pt reports he would only use the cane for 6 steps, then would use crutches at home. Pt demonstrated use of RW for mobility in room with improved ability to maintain precautions with cues. Educated on safety and compensatory strategies for ADLs with pt verbalizing understanding. Believe pt would benefit from wheelchair use for longer distances, but pt reports previous wheelchair that was provided was too large to fit in doorways at home.    Follow Up Recommendations  Home health OT    Equipment Recommendations  Wheelchair (measurements OT);Wheelchair cushion (measurements OT) (pt reports wheelchair he was provided was too large for home)    Recommendations for Other Services      Precautions / Restrictions Precautions Precautions: Fall Required Braces or Orthoses: Other Brace Other Brace: cam boot Restrictions Weight Bearing Restrictions: Yes LLE Weight Bearing: Non weight bearing Other Position/Activity Restrictions: CAM walker       Mobility Bed Mobility Overal bed mobility: Modified Independent                Transfers Overall transfer level: Needs assistance Equipment used: Rolling walker (2 wheeled);Straight cane Transfers: Sit to/from Stand Sit to Stand: Supervision         General transfer  comment: Supervision overall with one instance of min guard to correct LOB when taking steps backward. Pt reports plan to use cane to get into car at discharge - attempted to explain he would be placing weight through LE for this (as pt demo-ed) but he reports it will only be for 6 steps. Improved NWB maintenance with RW but pt reports feeling more comfortable using crutches    Balance Overall balance assessment: Needs assistance Sitting-balance support: No upper extremity supported Sitting balance-Leahy Scale: Good     Standing balance support: Bilateral upper extremity supported Standing balance-Leahy Scale: Poor Standing balance comment: reliant on UE support to maintain WB precautions                           ADL either performed or assessed with clinical judgement   ADL Overall ADL's : Needs assistance/impaired                       Lower Body Dressing Details (indicate cue type and reason): Educated on safety and compensatory strategies for LB dressing, as well as easier clothing to manage                     Vision   Vision Assessment?: No apparent visual deficits   Perception     Praxis      Cognition Arousal/Alertness: Awake/alert Behavior During Therapy: Impulsive Overall Cognitive Status: Impaired/Different from baseline Area of Impairment: Safety/judgement;Awareness  Safety/Judgement: Decreased awareness of safety;Decreased awareness of deficits Awareness: Emergent   General Comments: impulsive, decreased awareness of maintaining NWB precautions. Plans to use cane to get in car but attempted to educate would be putting weight through LE when using cane        Exercises     Shoulder Instructions       General Comments      Pertinent Vitals/ Pain       Pain Assessment: No/denies pain  Home Living                                          Prior Functioning/Environment               Frequency  Min 2X/week        Progress Toward Goals  OT Goals(current goals can now be found in the care plan section)  Progress towards OT goals: Progressing toward goals  Acute Rehab OT Goals Patient Stated Goal: Go home and use my crutches OT Goal Formulation: With patient Time For Goal Achievement: 10/22/20 Potential to Achieve Goals: Good ADL Goals Pt Will Perform Grooming: with modified independence;standing Pt Will Transfer to Toilet: with modified independence;ambulating;regular height toilet  Plan Discharge plan remains appropriate    Co-evaluation                 AM-PAC OT "6 Clicks" Daily Activity     Outcome Measure   Help from another person eating meals?: None Help from another person taking care of personal grooming?: None Help from another person toileting, which includes using toliet, bedpan, or urinal?: A Little Help from another person bathing (including washing, rinsing, drying)?: A Little Help from another person to put on and taking off regular upper body clothing?: None Help from another person to put on and taking off regular lower body clothing?: A Little 6 Click Score: 21    End of Session Equipment Utilized During Treatment: Gait belt;Rolling walker  OT Visit Diagnosis: Unsteadiness on feet (R26.81)   Activity Tolerance Patient tolerated treatment well   Patient Left in bed;with call bell/phone within reach   Nurse Communication Mobility status;Weight bearing status        Time: 9379-0240 OT Time Calculation (min): 17 min  Charges: OT General Charges $OT Visit: 1 Visit OT Treatments $Therapeutic Activity: 8-22 mins  Layla Maw, OTR/L   Layla Maw 10/09/2020, 7:55 AM

## 2020-10-09 NOTE — Progress Notes (Signed)
Patient is postop day 1 status post revision transmetatarsal amputation.  He is comfortable sitting up in bed he is ready to discharge.  Vital signs stable afebrile alert awake wound VAC is in place Prevena VAC is functioning with no current drainage.   Patient understands his weightbearing restrictions.  We will follow up in our office in 1 week for wound VAC removal or sooner if wound VAC malfunctions or he is having any difficulties

## 2020-10-09 NOTE — Discharge Summary (Signed)
Discharge Diagnoses:  Active Problems:   Abscess of left foot   Dehiscence of amputation stump (HCC)   Surgeries: Procedure(s): REVISION LEFT TRANSMETATARSAL AMPUTATION on 10/08/2020    Consultants:   Discharged Condition: Improved  Hospital Course: Jerimey Burridge. is an 64 y.o. male who was admitted 10/08/2020 with a chief complaint of left foot pain, with a final diagnosis of Dehiscence Left Transmetatarsal Amputation.  Patient was brought to the operating room on 10/08/2020 and underwent Procedure(s): REVISION LEFT TRANSMETATARSAL AMPUTATION.    Patient was given perioperative antibiotics:  Anti-infectives (From admission, onward)   Start     Dose/Rate Route Frequency Ordered Stop   10/08/20 1600  ceFAZolin (ANCEF) IVPB 2g/100 mL premix        2 g 200 mL/hr over 30 Minutes Intravenous Every 6 hours 10/08/20 1431 10/09/20 0409   10/08/20 0600  ceFAZolin (ANCEF) 3 g in dextrose 5 % 50 mL IVPB  Status:  Discontinued        3 g 100 mL/hr over 30 Minutes Intravenous On call to O.R. 10/07/20 1211 10/08/20 1415    .  Patient was given sequential compression devices, early ambulation, and aspirin for DVT prophylaxis.  Recent vital signs:  Patient Vitals for the past 24 hrs:  BP Temp Temp src Pulse Resp SpO2  10/09/20 0844 134/76 98.8 F (37.1 C) Oral 80 17 95 %  10/09/20 0354 138/64 97.8 F (36.6 C) Oral 70 15 95 %  10/09/20 0003 131/75 97.6 F (36.4 C) Oral 73 15 96 %  10/08/20 2149 -- -- -- -- -- 95 %  10/08/20 2015 135/74 98.7 F (37.1 C) Oral 74 15 94 %  10/08/20 1726 121/80 (!) 97.4 F (36.3 C) Oral 71 17 99 %  10/08/20 1625 122/83 97.9 F (36.6 C) Oral 69 17 93 %  10/08/20 1523 126/70 97.7 F (36.5 C) Oral 89 17 92 %  10/08/20 1420 132/74 (!) 97.4 F (36.3 C) Oral 75 17 98 %  10/08/20 1403 121/70 97.6 F (36.4 C) -- -- 20 --  10/08/20 1330 130/85 98.7 F (37.1 C) -- 73 15 100 %  10/08/20 1300 128/83 -- -- 68 13 97 %  10/08/20 1230 135/79 -- -- 65 13 98 %   10/08/20 1200 127/79 -- -- 64 20 98 %  10/08/20 1150 127/79 -- -- -- -- --  10/08/20 1130 119/75 -- -- 68 16 98 %  10/08/20 1115 120/77 -- -- 73 16 96 %  10/08/20 1106 120/67 (!) 97 F (36.1 C) -- 73 19 100 %  .  Recent laboratory studies: No results found.  Discharge Medications:   Allergies as of 10/09/2020   No Known Allergies     Medication List    STOP taking these medications   nitroGLYCERIN 0.2 mg/hr patch Commonly known as: NITRODUR - Dosed in mg/24 hr   oxyCODONE-acetaminophen 5-325 MG tablet Commonly known as: PERCOCET/ROXICET   pentoxifylline 400 MG CR tablet Commonly known as: TRENTAL   sulfamethoxazole-trimethoprim 800-160 MG tablet Commonly known as: BACTRIM DS     TAKE these medications   atorvastatin 10 MG tablet Commonly known as: LIPITOR Take 10 mg by mouth daily.   docusate sodium 50 MG capsule Commonly known as: COLACE Take 50 mg by mouth daily as needed for mild constipation.   DULoxetine 60 MG capsule Commonly known as: CYMBALTA Take 60 mg by mouth daily.   oxyCODONE 5 MG immediate release tablet Commonly known as: Oxy IR/ROXICODONE Take 1-2  tablets (5-10 mg total) by mouth every 4 (four) hours as needed for moderate pain (pain score 4-6).   pregabalin 150 MG capsule Commonly known as: Lyrica Take 1 capsule (150 mg total) by mouth 3 (three) times daily. What changed:   how much to take  when to take this   ProAir HFA 108 (90 Base) MCG/ACT inhaler Generic drug: albuterol Inhale 2 puffs into the lungs every 6 (six) hours as needed (for wheezing/shortness of breath.).   Symbicort 160-4.5 MCG/ACT inhaler Generic drug: budesonide-formoterol Inhale 2 puffs into the lungs 2 (two) times daily.   tamsulosin 0.4 MG Caps capsule Commonly known as: FLOMAX Take 0.4 mg by mouth 2 (two) times daily.   varenicline 1 MG tablet Commonly known as: CHANTIX Take 1 mg by mouth 2 (two) times daily.   Voltaren 1 % Gel Generic drug: diclofenac  Sodium Apply 1 application topically 4 (four) times daily as needed (pain.).            Durable Medical Equipment  (From admission, onward)         Start     Ordered   10/09/20 1049  For home use only DME 3 n 1  Once       Comments: Needs Heavy Duty / Bariatric size based on Height 34ft 8 in, weight 156 kg   10/09/20 1049          Diagnostic Studies: No results found.  Patient benefited maximally from their hospital stay and there were no complications.     Disposition: Discharge disposition: 01-Home or Self Care      Discharge Instructions    Call MD / Call 911   Complete by: As directed    If you experience chest pain or shortness of breath, CALL 911 and be transported to the hospital emergency room.  If you develope a fever above 101 F, pus (white drainage) or increased drainage or redness at the wound, or calf pain, call your surgeon's office.   Constipation Prevention   Complete by: As directed    Drink plenty of fluids.  Prune juice may be helpful.  You may use a stool softener, such as Colace (over the counter) 100 mg twice a day.  Use MiraLax (over the counter) for constipation as needed.   Diet - low sodium heart healthy   Complete by: As directed    Discharge instructions   Complete by: As directed    Elevate Foot. Call office if wound vac beeps or stops working. Keep dressing dry and clean   Increase activity slowly as tolerated   Complete by: As directed    Negative Pressure Wound Therapy - Incisional   Complete by: As directed    Show patient how to attach prevena vac. Call office if alarms or stops working      Follow-up Information    Quaid Yeakle, [place]evely [place], [place] In 1 week.   Specialty: Orthopedic Surgery Contact information: [address] [place] [address] [phone]                Signed: Bevely Palmer Iliani Vejar 10/09/2020, 10:59 AM

## 2020-10-09 NOTE — Progress Notes (Signed)
Physical Therapy Treatment Patient Details Name: [name]. MRN: [id number] DOB: 1956/06/05 Today's Date: 10/09/2020    History of Present Illness Patient is a 64 y/o male s/p revision L transmetatarsal amputation on 11/10. Patient with a PMH that includes kidney cancer, CKD, COPD, HTN, neuropathy in B LEs, R great toe amputation.    PT Comments    Patient continues to be unable to maintain NWB on L LE with mobility. Educated patient on importance of NWB status. Provided demonstration on stair negotiation with crutches to maintain NWB on L LE. Patient unable to maintain NWB with stair negotiation and tends to leave L LE behind on ascent. Patient ambulated in room 20' with crutches and min guard, unsafe ambulation with crutches with patient performing swing through pattern and leaning posteriorly throughout. WB through L LE to steady self and turn with crutches. Educated patient again about maintaining NWB with mobility, patient verbalized understanding but unable to demonstrate understanding. Continue to recommend HHPT following discharge and supervision for mobility.    Follow Up Recommendations  Home health PT;Supervision for mobility/OOB     Equipment Recommendations   (owns necessary equipment)    Recommendations for Other Services       Precautions / Restrictions Precautions Precautions: Fall Required Braces or Orthoses: Other Brace Other Brace: cam boot Restrictions Weight Bearing Restrictions: Yes LLE Weight Bearing: Non weight bearing Other Position/Activity Restrictions: CAM [name]    Mobility  Bed Mobility Overal bed mobility: Modified Independent                Transfers Overall transfer level: Needs assistance Equipment used: Rolling [name] (2 wheeled) Transfers: Sit to/from Stand Sit to Stand: Supervision         General transfer comment: supervision for safety, educated patient about NWB precautions in standing, patient unable to maintain  whle standing  Ambulation/Gait Ambulation/Gait assistance: Min guard Gait Distance (Feet): 20 Feet Assistive device: Crutches (bariatric) Gait Pattern/deviations: Step-through pattern;Leaning posteriorly;Drifts right/left;Wide base of support     General Gait Details: swing through pattern with crutches, able to maintain NWB during fwd progression but unable to maintain NWB for steadying and turning. Educated patient on slowing down due to increased risk of falls. Unsteady with swing through pattern, encouraged utilizing hop to pattern with crutches.    Stairs Stairs: Yes Stairs assistance: Min assist Stair Management: No rails;Forwards;With crutches Number of Stairs: 1 General stair comments: unable to maintain NWB on L LE with stair negotiation. Educated patient on safe negotiation. Patient with decreased awareness of safety and WB on L LE with stair negotiation. Instructed patient on crutch placement for ascent/descent.    Wheelchair Mobility    Modified Rankin (Stroke Patients Only)       Balance Overall balance assessment: Needs assistance Sitting-balance support: No upper extremity supported;Feet supported Sitting balance-[name] Scale: Good     Standing balance support: Bilateral upper extremity supported;During functional activity Standing balance-[name] Scale: Poor Standing balance comment: reliant on UE support to maintain WB precautions but still unable to fully maintain precautions                            Cognition Arousal/Alertness: Awake/alert Behavior During Therapy: Impulsive Overall Cognitive Status: Impaired/Different from baseline Area of Impairment: Safety/judgement;Awareness                         Safety/Judgement: Decreased awareness of safety;Decreased awareness of deficits Awareness: Emergent  General Comments: impulsive, decreased awareness of maintaining NWB precautions. Instructed and demo NWB with use of crutches,  patient continued to bear weight through L LE      Exercises      General Comments        Pertinent Vitals/Pain Pain Assessment: No/denies pain    Home Living Family/patient expects to be discharged to:: Private residence Living Arrangements: Alone Available Help at Discharge: Friend(s);Available PRN/intermittently Type of Home: House Home Access: Stairs to enter Entrance Stairs-Rails: None Home Layout: One level Home Equipment: Environmental consultant - 2 wheels;Cane - single point;Shower seat;Crutches      Prior Function Level of Independence: Independent with assistive device(s)      Comments: ambulates with SPC at baseline   PT Goals (current goals can now be found in the care plan section) Acute Rehab PT Goals Patient Stated Goal: to go home PT Goal Formulation: With patient Time For Goal Achievement: 10/23/20 Potential to Achieve Goals: Fair Progress towards PT goals: Progressing toward goals    Frequency    Min 3X/week      PT Plan Current plan remains appropriate    Co-evaluation              AM-PAC PT "6 Clicks" Mobility   Outcome Measure  Help needed turning from your back to your side while in a flat bed without using bedrails?: None Help needed moving from lying on your back to sitting on the side of a flat bed without using bedrails?: None Help needed moving to and from a bed to a chair (including a wheelchair)?: None Help needed standing up from a chair using your arms (e.g., wheelchair or bedside chair)?: None Help needed to walk in hospital room?: A Little Help needed climbing 3-5 steps with a railing? : A Little 6 Click Score: 22    End of Session Equipment Utilized During Treatment: Gait belt;Other (comment) (CAM boot) Activity Tolerance: Patient tolerated treatment well Patient left: in bed;with call bell/phone within reach Nurse Communication: Mobility status;Weight bearing status PT Visit Diagnosis: Unsteadiness on feet (R26.81);Other  abnormalities of gait and mobility (R26.89);Muscle weakness (generalized) (M62.81)     Time: 0165-5374 PT Time Calculation (min) (ACUTE ONLY): 12 min  Charges:  $Gait Training: 8-22 mins                      Perrin Maltese, PT, DPT Acute Rehabilitation Services Pager [phone] Office [phone]    Melene Plan Allred 10/09/2020, 11:49 AM

## 2020-10-09 NOTE — Discharge Summary (Signed)
Discharge Diagnoses:  Active Problems:   Abscess of left foot   Dehiscence of amputation stump (HCC)   Surgeries: Procedure(s): REVISION LEFT TRANSMETATARSAL AMPUTATION on 10/08/2020    Consultants:   Discharged Condition: Improved  Hospital Course: Charod Slawinski. is an 64 y.o. male who was admitted 10/08/2020 with a chief complaint of wound dehiscence left foot, with a final diagnosis of Dehiscence Left Transmetatarsal Amputation.  Patient was brought to the operating room on 10/08/2020 and underwent Procedure(s): REVISION LEFT TRANSMETATARSAL AMPUTATION.    Patient was given perioperative antibiotics:  Anti-infectives (From admission, onward)   Start     Dose/Rate Route Frequency Ordered Stop   10/08/20 1600  ceFAZolin (ANCEF) IVPB 2g/100 mL premix        2 g 200 mL/hr over 30 Minutes Intravenous Every 6 hours 10/08/20 1431 10/09/20 0409   10/08/20 0600  ceFAZolin (ANCEF) 3 g in dextrose 5 % 50 mL IVPB  Status:  Discontinued        3 g 100 mL/hr over 30 Minutes Intravenous On call to O.R. 10/07/20 1211 10/08/20 1415    .  Patient was given sequential compression devices, early ambulation, and aspirin for DVT prophylaxis.  Recent vital signs:  Patient Vitals for the past 24 hrs:  BP Temp Temp src Pulse Resp SpO2 Height Weight  10/09/20 0354 138/64 97.8 F (36.6 C) Oral 70 15 95 % -- --  10/09/20 0003 131/75 97.6 F (36.4 C) Oral 73 15 96 % -- --  10/08/20 2149 -- -- -- -- -- 95 % -- --  10/08/20 2015 135/74 98.7 F (37.1 C) Oral 74 15 94 % -- --  10/08/20 1726 121/80 (!) 97.4 F (36.3 C) Oral 71 17 99 % -- --  10/08/20 1625 122/83 97.9 F (36.6 C) Oral 69 17 93 % -- --  10/08/20 1523 126/70 97.7 F (36.5 C) Oral 89 17 92 % -- --  10/08/20 1420 132/74 (!) 97.4 F (36.3 C) Oral 75 17 98 % -- --  10/08/20 1403 121/70 97.6 F (36.4 C) -- -- 20 -- -- --  10/08/20 1330 130/85 98.7 F (37.1 C) -- 73 15 100 % -- --  10/08/20 1300 128/83 -- -- 68 13 97 % -- --   10/08/20 1230 135/79 -- -- 65 13 98 % -- --  10/08/20 1200 127/79 -- -- 64 20 98 % -- --  10/08/20 1150 127/79 -- -- -- -- -- -- --  10/08/20 1130 119/75 -- -- 68 16 98 % -- --  10/08/20 1115 120/77 -- -- 73 16 96 % -- --  10/08/20 1106 120/67 (!) 97 F (36.1 C) -- 73 19 100 % -- --  10/08/20 0930 (!) 158/100 -- -- 78 15 96 % -- --  10/08/20 0925 136/71 -- -- 75 (!) 8 97 % -- --  10/08/20 0920 (!) 147/81 -- -- 71 12 98 % -- --  10/08/20 0838 (!) 144/82 98.2 F (36.8 C) Oral 82 18 98 % 6\' 8"  (2.032 m) (!) 156.5 kg  .  Recent laboratory studies: No results found.  Discharge Medications:   Allergies as of 10/09/2020   No Known Allergies     Medication List    STOP taking these medications   nitroGLYCERIN 0.2 mg/hr patch Commonly known as: NITRODUR - Dosed in mg/24 hr   oxyCODONE-acetaminophen 5-325 MG tablet Commonly known as: PERCOCET/ROXICET   pentoxifylline 400 MG CR tablet Commonly known as: TRENTAL   sulfamethoxazole-trimethoprim  800-160 MG tablet Commonly known as: BACTRIM DS     TAKE these medications   atorvastatin 10 MG tablet Commonly known as: LIPITOR Take 10 mg by mouth daily.   docusate sodium 50 MG capsule Commonly known as: COLACE Take 50 mg by mouth daily as needed for mild constipation.   DULoxetine 60 MG capsule Commonly known as: CYMBALTA Take 60 mg by mouth daily.   oxyCODONE 5 MG immediate release tablet Commonly known as: Oxy IR/ROXICODONE Take 1-2 tablets (5-10 mg total) by mouth every 4 (four) hours as needed for moderate pain (pain score 4-6).   pregabalin 150 MG capsule Commonly known as: Lyrica Take 1 capsule (150 mg total) by mouth 3 (three) times daily. What changed:   how much to take  when to take this   ProAir HFA 108 (90 Base) MCG/ACT inhaler Generic drug: albuterol Inhale 2 puffs into the lungs every 6 (six) hours as needed (for wheezing/shortness of breath.).   Symbicort 160-4.5 MCG/ACT inhaler Generic drug:  budesonide-formoterol Inhale 2 puffs into the lungs 2 (two) times daily.   tamsulosin 0.4 MG Caps capsule Commonly known as: FLOMAX Take 0.4 mg by mouth 2 (two) times daily.   varenicline 1 MG tablet Commonly known as: CHANTIX Take 1 mg by mouth 2 (two) times daily.   Voltaren 1 % Gel Generic drug: diclofenac Sodium Apply 1 application topically 4 (four) times daily as needed (pain.).       Diagnostic Studies: No results found.  Patient benefited maximally from their hospital stay and there were no complications.     Disposition: Discharge disposition: 01-Home or Self Care      Discharge Instructions    Call MD / Call 911   Complete by: As directed    If you experience chest pain or shortness of breath, CALL 911 and be transported to the hospital emergency room.  If you develope a fever above 101 F, pus (white drainage) or increased drainage or redness at the wound, or calf pain, call your surgeon's office.   Constipation Prevention   Complete by: As directed    Drink plenty of fluids.  Prune juice may be helpful.  You may use a stool softener, such as Colace (over the counter) 100 mg twice a day.  Use MiraLax (over the counter) for constipation as needed.   Diet - low sodium heart healthy   Complete by: As directed    Discharge instructions   Complete by: As directed    Elevate Foot. Call office if wound vac beeps or stops working. Keep dressing dry and clean   Increase activity slowly as tolerated   Complete by: As directed    Negative Pressure Wound Therapy - Incisional   Complete by: As directed    Show patient how to attach prevena vac. Call office if alarms or stops working      Follow-up Information    Malekai Markwood, Bevely Palmer, [place] In 1 week.   Specialty: Orthopedic Surgery Contact information: [address] [place] [address] [phone]                Signed: Bevely Palmer Hien Cunliffe 10/09/2020, 7:09 AM

## 2020-10-09 NOTE — Evaluation (Signed)
Physical Therapy Evaluation Patient Details Name: [name]. MRN: [id number] DOB: 11-17-1956 Today's Date: 10/09/2020   History of Present Illness  Patient is a 64 y/o male s/p revision L transmetatarsal amputation on 11/10. Patient with a PMH that includes kidney cancer, CKD, COPD, HTN, neuropathy in B LEs, R great toe amputation.  Clinical Impression  PTA, patient was using cane for ambulation, however also states he was using crutches at times due to PA telling him to NWB on L because of wound. Patient impulsive with mobility and unable to maintain NWB on L with all mobility. Educated patient on importance of maintaining NWB precautions and demonstrated with RW. Patient ambulated 20' with RW and min guard, unable to maintain L LE NWB 90% of time. Multimodal cues were provided to prevent WB on L LE, inconsistent follow through by patient. Educated patient about not using cane for ambulation due to inability to maintain NWB with cane, patient verbalized understanding and stated he learned that from difficulty with OT earlier. Patient will benefit from skilled PT services to address listed deficits (see PT Problem list). Recommend HHPT following discharge to maximize functional mobility and safety in the home.     Follow Up Recommendations Home health PT;Supervision for mobility/OOB    Equipment Recommendations  None recommended by PT (pt owns necessary equipment)    Recommendations for Other Services       Precautions / Restrictions Precautions Precautions: Fall Required Braces or Orthoses: Other Brace Other Brace: cam boot Restrictions Weight Bearing Restrictions: Yes LLE Weight Bearing: Non weight bearing Other Position/Activity Restrictions: CAM [name]      Mobility  Bed Mobility Overal bed mobility: Modified Independent                  Transfers Overall transfer level: Needs assistance Equipment used: Rolling [name] (2 wheeled) Transfers: Sit to/from  Stand Sit to Stand: Supervision         General transfer comment: supervision for safety, educated patient about WB while standing and need for maintaining NWB precautions. Patient continued to WB in standing following verbal and tactile cues.  Ambulation/Gait Ambulation/Gait assistance: Min guard Gait Distance (Feet): 20 Feet Assistive device: Rolling [name] (2 wheeled) Gait Pattern/deviations: Step-to pattern;Wide base of support     General Gait Details: unable to maintain NWB with consistent cueing, patient does not understand meaning of NWB. Educated and demonstrated NWB with RW. Patient states he feels more comfortable with his crutches. States they are in his car downstairs. Patient unaware of importance of NWB precautions, with more education patient was able to better understand and states "that's probably why the wound didn't heal last time. I was putting weight on it."  Stairs            Wheelchair Mobility    Modified Rankin (Stroke Patients Only)       Balance Overall balance assessment: Needs assistance Sitting-balance support: No upper extremity supported Sitting balance-Leahy Scale: Good     Standing balance support: Bilateral upper extremity supported Standing balance-Leahy Scale: Poor Standing balance comment: reliant on UE support to maintain WB precautions but still unable to fully maintain precautions                             Pertinent Vitals/Pain Pain Assessment: No/denies pain    Home Living Family/patient expects to be discharged to:: Private residence Living Arrangements: Alone Available Help at Discharge: Friend(s);Available PRN/intermittently Type of Home:  House Home Access: Stairs to enter Entrance Stairs-Rails: None Entrance Stairs-Number of Steps: 2 Home Layout: One level Home Equipment: [name] - 2 wheels;Cane - single point;Shower seat;Crutches      Prior Function Level of Independence: Independent with assistive  device(s)         Comments: ambulates with SPC at baseline     Hand Dominance        Extremity/Trunk Assessment   Upper Extremity Assessment Upper Extremity Assessment: Overall WFL for tasks assessed    Lower Extremity Assessment Lower Extremity Assessment: LLE deficits/detail LLE: Unable to fully assess due to immobilization       Communication   Communication: No difficulties  Cognition Arousal/Alertness: Awake/alert Behavior During Therapy: Impulsive Overall Cognitive Status: Impaired/Different from baseline Area of Impairment: Safety/judgement;Awareness                         Safety/Judgement: Decreased awareness of safety;Decreased awareness of deficits Awareness: Emergent   General Comments: impulsive, decreased awareness of maintaining NWB precautions. Instructed and demo NWB with use of RW, patient continued to bear weight through L LE      General Comments      Exercises     Assessment/Plan    PT Assessment Patient needs continued PT services  PT Problem List Decreased strength;Decreased activity tolerance;Decreased balance;Decreased mobility;Decreased safety awareness;Decreased knowledge of precautions       PT Treatment Interventions DME instruction;Gait training;Stair training;Functional mobility training;Therapeutic activities;Therapeutic exercise;Balance training;Patient/family education    PT Goals (Current goals can be found in the Care Plan section)  Acute Rehab PT Goals Patient Stated Goal: Go home and use my crutches PT Goal Formulation: With patient Time For Goal Achievement: 10/23/20 Potential to Achieve Goals: Fair    Frequency Min 3X/week   Barriers to discharge        Co-evaluation               AM-PAC PT "6 Clicks" Mobility  Outcome Measure Help needed turning from your back to your side while in a flat bed without using bedrails?: None Help needed moving from lying on your back to sitting on the side of  a flat bed without using bedrails?: None Help needed moving to and from a bed to a chair (including a wheelchair)?: None Help needed standing up from a chair using your arms (e.g., wheelchair or bedside chair)?: None Help needed to walk in hospital room?: A Little Help needed climbing 3-5 steps with a railing? : A Little 6 Click Score: 22    End of Session Equipment Utilized During Treatment: Gait belt;Other (comment) (CAM boot) Activity Tolerance: Patient tolerated treatment well Patient left: in bed;with call bell/phone within reach Nurse Communication: Mobility status;Weight bearing status PT Visit Diagnosis: Unsteadiness on feet (R26.81);Other abnormalities of gait and mobility (R26.89);Muscle weakness (generalized) (M62.81)    Time: 8119-1478 PT Time Calculation (min) (ACUTE ONLY): 24 min   Charges:   PT Evaluation $PT Eval Moderate Complexity: 1 Mod PT Treatments $Therapeutic Activity: 8-22 mins        [name], PT, DPT Acute Rehabilitation Services Pager [phone] Office [phone]   [name] 10/09/2020, 8:53 AM

## 2020-10-13 ENCOUNTER — Encounter (HOSPITAL_BASED_OUTPATIENT_CLINIC_OR_DEPARTMENT_OTHER): Payer: Medicare PPO | Admitting: Internal Medicine

## 2020-10-13 ENCOUNTER — Other Ambulatory Visit: Payer: Self-pay

## 2020-10-13 DIAGNOSIS — Z89411 Acquired absence of right great toe: Secondary | ICD-10-CM | POA: Diagnosis not present

## 2020-10-13 DIAGNOSIS — L97518 Non-pressure chronic ulcer of other part of right foot with other specified severity: Secondary | ICD-10-CM | POA: Diagnosis not present

## 2020-10-13 DIAGNOSIS — G629 Polyneuropathy, unspecified: Secondary | ICD-10-CM | POA: Diagnosis not present

## 2020-10-13 DIAGNOSIS — G9009 Other idiopathic peripheral autonomic neuropathy: Secondary | ICD-10-CM | POA: Diagnosis not present

## 2020-10-14 ENCOUNTER — Other Ambulatory Visit: Payer: Self-pay | Admitting: Physician Assistant

## 2020-10-14 ENCOUNTER — Ambulatory Visit (INDEPENDENT_AMBULATORY_CARE_PROVIDER_SITE_OTHER): Payer: Medicare PPO | Admitting: Physician Assistant

## 2020-10-14 ENCOUNTER — Encounter: Payer: Self-pay | Admitting: Physician Assistant

## 2020-10-14 DIAGNOSIS — G9009 Other idiopathic peripheral autonomic neuropathy: Secondary | ICD-10-CM | POA: Diagnosis not present

## 2020-10-14 DIAGNOSIS — Z89432 Acquired absence of left foot: Secondary | ICD-10-CM

## 2020-10-14 DIAGNOSIS — T8781 Dehiscence of amputation stump: Secondary | ICD-10-CM | POA: Diagnosis not present

## 2020-10-14 DIAGNOSIS — I129 Hypertensive chronic kidney disease with stage 1 through stage 4 chronic kidney disease, or unspecified chronic kidney disease: Secondary | ICD-10-CM | POA: Diagnosis not present

## 2020-10-14 DIAGNOSIS — J449 Chronic obstructive pulmonary disease, unspecified: Secondary | ICD-10-CM | POA: Diagnosis not present

## 2020-10-14 DIAGNOSIS — L97518 Non-pressure chronic ulcer of other part of right foot with other specified severity: Secondary | ICD-10-CM | POA: Diagnosis not present

## 2020-10-14 DIAGNOSIS — M86672 Other chronic osteomyelitis, left ankle and foot: Secondary | ICD-10-CM | POA: Diagnosis not present

## 2020-10-14 DIAGNOSIS — T8744 Infection of amputation stump, left lower extremity: Secondary | ICD-10-CM | POA: Diagnosis not present

## 2020-10-14 DIAGNOSIS — K5792 Diverticulitis of intestine, part unspecified, without perforation or abscess without bleeding: Secondary | ICD-10-CM | POA: Diagnosis not present

## 2020-10-14 DIAGNOSIS — N189 Chronic kidney disease, unspecified: Secondary | ICD-10-CM | POA: Diagnosis not present

## 2020-10-14 MED ORDER — OXYCODONE HCL 5 MG PO TABS: 5.0000 mg | ORAL_TABLET | ORAL | 0 refills | Status: DC | PRN

## 2020-10-14 NOTE — Progress Notes (Signed)
SIDI, DZIKOWSKI ([id number]) Visit Report for 10/13/2020 Debridement Details Patient Name: Date of Service: [name], [name] 10/13/2020 10:00 A M Medical Record Number: [id number] Patient Account Number: [id number] Date of Birth/Sex: Treating RN: 11/14/1956 (64 y.o. [name] Primary Care Provider: [name] Other Clinician: Referring Provider: Treating Provider/Extender: [name] in Treatment: 16 Debridement Performed for Assessment: Wound #12 Right Metatarsal head first Performed By: Physician [name]., MD Debridement Type: Debridement Level of Consciousness (Pre-procedure): Awake and Alert Pre-procedure Verification/Time Out Yes - 10:54 Taken: Start Time: 10:54 T Area Debrided (L x W): otal 4 (cm) x 0.9 (cm) = 3.6 (cm) Tissue and other material debrided: Viable, Non-Viable, Callus, Subcutaneous Level: Skin/Subcutaneous Tissue Debridement Description: Excisional Instrument: Blade Bleeding: Moderate Hemostasis Achieved: Silver Nitrate End Time: 10:55 Procedural Pain: 0 Post Procedural Pain: 0 Response to Treatment: Procedure was tolerated well Level of Consciousness (Post- Awake and Alert procedure): Post Debridement Measurements of Total Wound Length: (cm) 4 Width: (cm) 0.9 Depth: (cm) 0.4 Volume: (cm) 1.131 Character of Wound/Ulcer Post Debridement: Improved Post Procedure Diagnosis Same as Pre-procedure Electronic Signature(s) Signed: 10/13/2020 5:39:18 PM By: [name] MD Signed: 10/13/2020 5:51:53 PM By: Levan Hurst RN, BSN Entered By: [name] on 10/13/2020 11:12:02 -------------------------------------------------------------------------------- HPI Details Patient Name: Date of Service: [name], [name] 10/13/2020 10:00 A M Medical Record Number: [id number] Patient Account Number: [id number] Date of Birth/Sex: Treating RN: September 29, 1956 (64 y.o. [name] Primary Care  Provider: [name] Other Clinician: Referring Provider: Treating Provider/Extender: [name] in Treatment: 16 History of Present Illness Location: right first metatarsal head on the plantar aspect and the left big toe and the right fifth toe Quality: Patient reports No Pain. Severity: Patient states wound(s) are getting worse. Duration: Patient has had the wound for > 24 months prior to seeking treatment at the wound center Context: The wound would happen gradually Modifying Factors: Wound improving due to current treatment. he has been under podiatry care for over a year and a half ssociated Signs and Symptoms: Patient reports having: heaviness in both legs A HPI Description: This 64 year old gentleman who has had a long-standing neuropathy of both feet without history of diabetes mellitus has never been worked up for his neurological problem in the last 5 years or so. Most recently he has been seen by the podiatrist Dr. [name] who was been seeing them for about a year and a half and from what I understand has been treating him for bilateral ulceration on the feet, the right being in the region of the first metatarsal head plantar aspect and the left being in the area of the left big toe. He's had various antibiotics including Levaquin recently and this was then switched to Cipro and clindamycin. Due to the nature of this wound which has been there for a long while he has been recently referred to [name] for an opinion. An MRI was done in the middle of June which showed several small abscesses near the amputation site of the right great toe with draining and open wounds with surrounding cellulitis but no evidence of septic arthritis, osteomyelitis or pyomyositis. I understand at this stage he was taken to the operating room by [name] who debrided the wound and cleaned out the abscess. I understand ABIs were done bilaterally and they were within  normal limits at Dr. [name] office. The patient is a smoker and is working on quitting smoking.  07/13/2017 -- had a lower extremity arterial duplex evaluation which showed patent bilateral lower extremity arteries without evidence of hemodynamically significant stenosis. His right ABI was 1.05 and the left was 0.95 He had a lower extremity venous duplex reflux evaluation done which showed significant venous incompetence in the bilateral common femoral, saphenofemoral junction, small saphenous vein and in the right great saphenous vein. With these findings I believe he will benefit from a consultation with the vascular surgeon regarding the endovenous ablation procedure. the patient's neurology appointment and dermatology appointment is still pending. He has had a x-ray done by his PCP and the results are not available. However he did see [name] who I understand has ordered a bilateral MRI of his feet. 07/26/2017 -- was seen by [name] on 07/20/2017. His impression was that he presented with minimal bilateral lower extremity peripheral arterial disease, bilateral lower extremity chronic venous insufficiency (C4) and radiculopathy versus neuropathy bilateral lower extremity. No arterial intervention was needed at the present time. He recommended maximal medical management at this stage for his atherosclerotic disease . He recommended compression to be continued and no venous intervention at the present time. He was offered follow-up in the Vein clinic in 3 months time. MR of the right foot done with and without contrast -- IMPRESSION:Wound on the plantar surface of the foot at approximately the level of first MTP joint with a tiny underlying soft tissue abscess. Large area of soft tissue edema subjacent to the wound is most compatible with granulation tissue. Negative for osteomyelitis about the first MTP joint. Mild edema and enhancement in the proximal phalanx of the little toe is stable  compared to the prior examination and may be due to stress change. Infection is possible but thought highly unlikely. Subcutaneous edema over the dorsum of the foot compatible with dependent change/cellulitis. the patient also has had a abdominal CT which shows a kidney mass and is going to have her MRI for this. He also has a urology opinion pending and a general surgeon's opinion regarding a colonic mass. 08/02/2017 -- his appointments with his other urology and surgical consultants is still later. He has an MRI of the left foot later this week. 08/09/2017 --MR of the left foot with and without contrast -- IMPRESSION:Large appearing skin wound on the medial aspect of the great toe without underlying abscess or osteomyelitis.First MTP osteoarthritis. Marked marrow edema in the medial sesamoid bone is likely related to osteoarthritis but could be due to sesamoiditis. 08/16/2017 -- the patient has had a surgical opinion and a urological opinion and a extensive surgery including possible colon resection, bladder surgery and kidney surgery is being planned. No date has been set for his procedure He continues to be off smoking 08/31/2017 -- he was seen by the neurologist for his multiple problems of neuropathic and radicular symptoms in both legs with a thorough workup done. MRIs were reviewed lab work was reviewed and the assessment was that he had severe neuropathy due to multifactorial reasons and a neuropathy panel of lab work was ordered and he would be reviewed back. Patient is also due for colonoscopy to be repeated before his bladder and kidney surgery. At the present time he would like [name] to continue with conservative and symptomatic wound care for both his feet. 10/05/2017 -- since I have seen him last time he has had a colonoscopy which was fairly normal except for a polyp and he has a urology appointment pending later today. He still continues  to stay off smoking. 10/26/2017-- he returns  after about 3 weeks and in the meanwhile he was seen by Dr. Kellie Simmering, who reviewed his venous studies and noted that there was very minimal enlargement of the bilateral great saphenous vein on the right and somewhat larger vein on the left but no consistent reflux throughout the great saphenous veins and there was also some right small saphenous veins which were enlarged with some reflux. However overall he thought that venous insufficiency was not significantly the cause of his ulceration and it was more of trophic ulcers and hence he recommended aggressive wound care, and at some stage he may need amputation of the left first toe and the right fifth toe. 11/09/2017 -- due to the inclement weather he has missed his urology appointment and is still awaiting the rescheduled one. Other than that he's been doing fairly well. 11/23/2017 -- he has had his cystoscopy done by his urologist and they found multiple areas of concern and he is going to be set up for a procedure sometime later in January. He is on complex care as far as his wounds go and seizures every 2-3 weeks 12/07/17 patient presents today for evaluation concerning his right great toe amputation site as well as his left great toe ulcer. He has been tolerating the dressing changes fairly well. With that being said there does not appear to be any significant discomfort at this point that he is experiencing although he has quite significant callous especially in regard to the amputation site of the right great toe. Nonetheless he has no evidence of infection which is good news No fevers, chills, nausea, or vomiting noted at this time. He has no pain. READMISSION 05/09/18; is a patient who hasn't been here in quite some time. He is not a diabetic but he has severe idiopathic peripheral neuropathy. He was here for review of a nonhealing wound on the right great toe amputation site and a large portion of the tip of the left great toe. Sometime after his  last visit here he became ill he had abdominal issues infections and required hospitalizations. He largely dropped off the map. He is putting silver alginate on the wounds. I note that he had arterial studies and saw Dr. Bridgett Larsson of vascular surgery was not felt to have an arterial issue. He also had reflux studies and he wears compression stockings and he is faithful with these. He had MRI of both of his feet that not show osteomyelitis question small abscess on the right. He tells me that the nonhealing wound on his right great toe amputation site is been there since the actual amputation which was in 2014 I note the area was aggressively debrided by Dr. Jacqualyn Posey of podiatry before he started coming to this clinic apparently there were abscess is present at that time. His last formal arterial studies were done in August 2018 which time his ABI was 1.05 on the right and 0.95 on the left he had biphasic and triphasic waveforms on the left and monophasic and biphasic waveforms on the right. He did not have TBI's. ABIs in our clinic today were 0.8 bilaterally. The patient states he is not a diabetic. He does have idiopathic peripheral neuropathy. He is a smoker but is trying to quit with Chantix. 05/16/18; x-rays of his bilateral feet were negative for osteomyelitis. He was noted to have a chronic deformity of the head of the second metatarsal which may be due to previous osteonecrosis there was no  definite underlying bone destruction to suggest osteomyelitis on either side. He came in with the extensive callus over his right great toe amputation site and the left great toe did extensive debridement last week and another extensive debridement today.patient is changing his dressing himself with silver alginate 05/30/18; still buildup of thick callus covering thick subcutaneous tissue around both of these areas. This requires an extensive debridement on both sides. I have discussed offloading this area with the  patient. I think he inverts when he walks. His foot sizes beyond what we can put in a Darco forefoot off loader or probably what we can put in a total contact cast. 06/13/18; still a remarkable buildup of callus over the right first toe amputation site. The left first toe has 2 open wounds one medially and 1 at the tip of the toe. Surrounding callus is also not back here. We've been using silver alginate. He has a darco forefoot off loader now on the right foot.his own shoe on the left foot. Would like to try a total contact cast on the right. He is making arrangements for someone to drive him here in 2 weeks. 06/29/18; still a buildup of callus over the right first toe amputation site although not as significant as previously. The left first toe has 2 open wounds one medially and one at the tip surrounding callus as well. We've been using silver alginate. He comes in today with the right for a total contact cast which I'll use on the right 07/04/18 on evaluation today patient appears to be doing decently well in regard to the cast. Unfortunately he did have a little area that rubbed on the lateral aspect of his leg the cast actually cracked on the medial aspect this happened he states just yesterday. He has not been walking in the cast without the boot portion on. With that being said he states that he is unsure of exactly what happened with the crack. Nonetheless this does seem to have caused a little bit of rubbing on the lateral aspect I think due to the integrity of the cast being compromised. Nonetheless I advised him that if this happens in the future he needs to let us know soon as possible. Nonetheless I do feel like the cast has been of benefit some of the area on the distal portion of his great toe amputation site actually appears to be doing better. 07/12/18 on evaluation today patient appears to be doing very well in regard to his right great toe invitation site. This seems to be showing signs  of excellent improvement which is great news. There does not appear to be any evidence of infection at this time. With that being said he has been tolerating the dressing changes without complication. There appears to be no evidence of infection and again this is barely open at this point. The total contact cast has been of excellent benefit for him. I believe this will likely be completely closed and ready next week that we can switch to a cast on the left foot. He states he may want to take a week's break. 07/19/18 on evaluation today patient appears to be doing better in regard to both ulceration areas. The right foot actually appears to be completely healed he still has some callous but no open wound. The left foot/great toe actually seems to be better after last week's debridement overall the appearance of the wound is greatly improved. 08/09/18 on evaluation today patient actually appears to be doing  a little bit worse in regard to the right metatarsal invitation site. He's been tolerating the dressing changes without complication. With that being said he does note that the area actually cracked open as of today when he was in the shower trying to scrub it. Nonetheless it does appear that the Caryn Section that previously was open and draining has subsequently reopened and is draining again. Nonetheless as I was looking at him and performing the debridement today I did inquire about whether or not we had ever biopsy the area as I was concerned about the possibility for something along the lines of a work type virus/infection causing the excessive callous buildup. Especially since he seems to grow much more than what would just be traditionally expected by friction alone. He subsequently told me that he did have this biopsied somewhere around 1-2 years ago by Dr. Jacqualyn Posey and that it showed that he had a wart infection at that point. Nonetheless he was never referred to dermatology better Jacqualyn Posey attempted  to cut it out according to the patient. Nonetheless that may be part of the big issue with what we're dealing with here and without treating the wart infection we may not actually get this to heal and stay closed. READMISSION 12/07/2018 The patient is back in clinic today essentially with wounds in the same area and condition is that I think the last 2 times he has been here. This includes the right first metatarsal head in the setting of a previous right great toe amputation and a large part of the medial part of his left great toe. The patient states he has been dealing with this for years dating back to 2005 on and off. That he is never found this to be totally healed. When he was last in this clinic he had this biopsied shave biopsies which showed hyperkeratotic and parakeratotic debris. Epithelium with papillary features negative for malignancy. He has been using silver alginate. He has healing sandals which he uses Felt on the bottom of these. When he was here last time we put him a total contact cast at least for a while and the patient states on the right that helped with the buildup of hyperkeratotic tissue and that seems to be reflective in our notes. He also states that was previously biopsied by Dr. Jacqualyn Posey of podiatry. The patient has a severe idiopathic peripheral neuropathy which he attributes to a brown recluse spider bite in 2002 or so. He is not a diabetic The patient had arterial studies in this showed an ABI in the right of 1.05 on the left was 0.95. TBI's were not done. He had monophasic waveforms at the right posterior tibial biphasic at the dorsalis pedis. On the left he was biphasic at the posterior tibial and triphasic at the dorsalis pedis. 1/17; this is a patient with a very challenging issue was readmitted to the clinic last week. He has a large area over the first right MTP in the setting of a previous right great toe amputation. On presentation this is covered with copious  amounts of hyperkeratotic thick callus nonviable tissue. When this is debridement both on this occasion and previous occasions in this clinic he has a very boggy presentation to the subcutaneous tissue which looks somewhat atypical. He has a similar area on the left great toe from the tip of the toe down the medial aspect beyond the interphalangeal joint. He's had previous MRIs of both of his feet. On the right that showed small abscesses  and he was taken to the OR by podiatry for debridement and cleaning out of the abscesses. An MRI did not show osteomyelitis of the left first toe. Both the MRIs were done in 2018. He saw Dr. Vallarie Mare at the time who did not think he had significant macrovascular disease. He is not a diabetic but he has idiopathic peripheral neuropathy and was a significant smoker. When he was in the clinic earlier in 2019 he had biopsies done of both areas that showed hyperkeratotic material but no malignancy. X-rays I ordered last week did not show osteomyelitis on the left but did suggest osteomyelitis at the tip of the left great toe. I looked at the x-rays myself this is a subtle finding it is only seen on the lateral film. I did a very significant debridement on him last week bilaterally in both the wound areas look better. When he was previously in our clinic the area on the right did improve with a total contact cast. He dropped out of the schedule at that point I'm not exactly sure why 1/24; the biopsy I did of the area over his right first metatarsal head showed the possibility of verruca vulgaris. Superimposed changes of lichen simplex chronicus. If this is a plantar wart this would be extremely large. I think he would need topical destructive therapy and I am going to refer him to dermatology. There was no suggestion of malignancy. Previous biopsies in this clinic on both his areas did not show evidence of malignancy either. Until we get dermatology I am going to continue with  silver alginate. X-rays suggested osteomyelitis of the left toe I am going to continue him on trimethoprim sulfamethoxazole 2/3; not much change in this area on the right first met head. Extensive dark eschar formation around the central area of nonviable subcutaneous tissue looking like a boggy subcutaneous tissue. Using a #10 scalpel and pickups I removed all of this. I have tried this before and this man but I plan to put him in a total contact cast next week Using a #5 curette debridement of the left great toe He has an appointment with dermatology at [facility] on the 25th 2/11; right first metatarsal head after last week's extensive debridement is right back to the way it was with a nonviable subcutaneous tissue and thick surrounding eschar. The left great toe looks about the same. The patient could not have a total contact cast today because he had household problems [leaking roof] but I am not going to put a cast on him until I have information from the dermatologist about this. READMISSION 06/17/2020 Mr. Achor is now a 64 year old man that we have had in this clinic on at least 2 occasions previously. When he was last here in February 2020 I referred him to dermatology at [facility] for their review of large wounds on the right first metatarsal head with surrounding severe chronic hyperkeratosis. This was biopsied on at least 2 occasions in our clinic and I think at [facility] as well that did not show an underlying malignancy. I have reviewed the consult from Dr. Sharlette Dense dermatology from 01/23/2019. At that time the patient had wounds on the plantar right first metatarsal head as well as the plantar left great toe. From what he is saying the left great toe healed however he recently had to have a left TMA by Dr. 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 Dermatology at [facility] apparently  referred him to Dr. Luetta Nutting of plastic surgery and an operative  excision of this area was planned however apparently they ran into the pandemic. The procedure was repetitively cancel and the patient did not keep his final appointment which I think was earlier this year he was also seen in February of this year at the wound care center in [facility] by Dr. Zigmund Daniel. They were applying Silvadene cream and Xeroform which she is essentially doing now. I see in his notes that there was still some concern about underlying malignancy although would be difficult to believe as long as this is been there that there would not be more extensive damage at this point in any case he is back in our clinic for review of this. He had an x-ray of the foot on 01/23/2020 that was negative for osteomyelitis acute fracture. Past medical history; idiopathic peripheral neuropathy, right great toe amputation in 2011, lumbar spinal stenosis, left transmetatarsal amputation on 05/30/2020 by Dr. Sharol Given and more recently apparently a slight wound dehiscence. He also had a fracture of his left tibial plateau sometime in 2020. ABI in our clinic was 0.95 8/9-Patient returns after being established in the clinic last time, he has a new open area on the plantar aspect of his right second toe, the right foot plantar ulcer below the great toe on the right looks slightly smaller, using PolyMem. Patient attends to his surgeon for the left foot wound 8/23; this is a patient with a refractory wound over his right first metatarsal head in the setting of severe surrounding hyperkeratosis. I had referred him to dermatology subsequently came to the attention of Dr. Vernona Rieger and Dr. Zigmund Daniel at [facility]. He also had a left TMA by Dr. Sharol Given on the left foot because of osteomyelitis I believe. He is in bilateral surgical shoes. 9/13; refractory wound over the right first metatarsal head with surrounding hyperkeratosis Verrucous skin. He had a left transmetatarsal amputation by Dr. Sharol Given. We have not been following this however this  apparently is still not closed. I have looked over his notes from [facility]. This is well summarized by a note from Dr. Zigmund Daniel on February 24. The patient had had several biopsies of the skin of the first toe metatarsal head. As well as the left great toe which has since been amputated. The pathology results showed Stratus corneum with hyperkeratosis and parakeratosis. An underlying verricous carcinoma could not be excluded. I think the patient was given follow-up but I will actually think that he kept his appointments. He was sent back to follow-up with him with them again in February 2021. Dr. Luetta Nutting who is the plastic surgeon recommended wound care rather than extensive debridement. I am very doubtful that this represents any form of malignancy. This patient has been dealing with this wound for years and I think we would have had a declaration of status well before this if this were any form of malignancy. I also do not believe he has an arterial issue. ABI in the clinic here was 0.95. He has easily palpable peripheral pulses. 10/4; generally gradually better wound area over the right plantar foot first metatarsal head.Marland Kitchen He still has thick surrounding callus around the wound margins and a fibrinous debris over the wound surface although in general a lot of this looks considerably better. He has been following with orthopedics for a left amputation by Dr. Sharol Given. We have not been following this 10/18; first metatarsal head wound is still open however there is less adherent callus  and thick skin around the edge of this. I think this largely has to do with aggressive debridement last time and I am going to repeat this today. He is still dealing with a wound on the left transmetatarsal amputation site this is being followed by Dr. Sharol Given. He has a surgical shoe on the left foot we have not been following this. He is using crutches apparently reliably to offload both his feet. 11/1 right first metatarsal head.  Smaller surface area considerable amount of callus around this wound. This is a chronic issue. He has had a left transmetatarsal amputation followed by Dr. Sharol Given there is still wounds in this area he is followed with Dr. Sharol Given has a surgical shoe here 11/15; right first metatarsal head I think the wound is about the same substantial surrounding callus. This is a chronic issue. He apparently has been back to the OR with Dr. Sharol Given with regards to his left foot wound/amputation site he comes in today with a new surgical wound and a wound VAC with a full canister. The relevance of this to [name] as I had really wanted to put this man in a total contact cast but I cannot do that as long as he is in a surgical boot on the left side Electronic Signature(s) Signed: 10/13/2020 5:39:18 PM By: [name] MD Entered By: [name] on 10/13/2020 11:13:03 -------------------------------------------------------------------------------- Physical Exam Details Patient Name: Date of Service: [name], [name] 10/13/2020 10:00 A M Medical Record Number: [id number] Patient Account Number: [id number] Date of Birth/Sex: Treating RN: 1956-01-15 (64 y.o. [name] Primary Care Provider: [name] Other Clinician: Referring Provider: Treating Provider/Extender: [name] in Treatment: 16 Constitutional Sitting or standing Blood Pressure is within target range for patient.. Pulse regular and within target range for patient.Marland Kitchen Respirations regular, non-labored and within target range.. Temperature is normal and within the target range for the patient.Marland Kitchen Appears in no distress. Notes Wound exam; wound surface on the right foot metatarsal head. Circumferential thick callus. I used a #10 scalpel to remove this going into subcutaneous tissue around the wound hemostasis with silver nitrate. The surface of the wound does not look terrible. I went over this with a #10 blade as well  not a lot of fibrinous debris on the surface perhaps some. Hemostasis with direct pressure Electronic Signature(s) Signed: 10/13/2020 5:39:18 PM By: [name] MD Entered By: [name] on 10/13/2020 11:14:26 -------------------------------------------------------------------------------- Physician Orders Details Patient Name: Date of Service: [name], [name] 10/13/2020 10:00 A M Medical Record Number: [id number] Patient Account Number: [id number] Date of Birth/Sex: Treating RN: 04-Aug-1956 (64 y.o. [name] Primary Care Provider: [name] Other Clinician: Referring Provider: Treating Provider/Extender: [name] in Treatment: [phone] Verbal / Phone Orders: No Diagnosis Coding ICD-10 Coding Code Description L97.518 Non-pressure chronic ulcer of other part of right foot with other specified severity G90.09 Other idiopathic peripheral autonomic neuropathy Follow-up Appointments Return Appointment in 2 weeks. Dressing Change Frequency Wound #12 Right Metatarsal head first Change Dressing every other day. Skin Barriers/Peri-Wound Care Moisturizing lotion - to feet daily Wound Cleansing Wound #12 Right Metatarsal head first Clean wound with Wound Cleanser - or normal saline Primary Wound Dressing Wound #12 Right Metatarsal head first Polymem Silver Secondary Dressing Wound #12 Right Metatarsal head first Kerlix/Rolled Gauze - secure with tape Dry Gauze Other: - felt callous pad or foam donut Off-Loading Open toe surgical shoe to: - right foot Home  [facility] for wound care. Alvis Lemmings Electronic Signature(s) Signed: 10/13/2020 5:39:18 PM By: [name] MD Signed: 10/13/2020 5:51:53 PM By: Levan Hurst RN, BSN Entered By: Levan Hurst on 10/13/2020 10:57:09 -------------------------------------------------------------------------------- Problem List Details Patient Name: Date of  Service: [name], [name] 10/13/2020 10:00 A M Medical Record Number: [id number] Patient Account Number: [id number] Date of Birth/Sex: Treating RN: 10-01-1956 (64 y.o. [name] Primary Care Provider: [name] Other Clinician: Referring Provider: Treating Provider/Extender: [name] in Treatment: 16 Active Problems ICD-10 Encounter Code Description Active Date MDM Code Description Active Date MDM Diagnosis L97.518 Non-pressure chronic ulcer of other part of right foot with other specified 06/17/2020 No Yes severity G90.09 Other idiopathic peripheral autonomic neuropathy 06/17/2020 No Yes Inactive Problems Resolved Problems Electronic Signature(s) Signed: 10/13/2020 5:39:18 PM By: [name] MD Entered By: [name] on 10/13/2020 11:11:46 -------------------------------------------------------------------------------- Progress Note Details Patient Name: Date of Service: [name], [name] 10/13/2020 10:00 A M Medical Record Number: [id number] Patient Account Number: [id number] Date of Birth/Sex: Treating RN: 1956/05/04 (64 y.o. [name] Primary Care Provider: [name] Other Clinician: Referring Provider: Treating Provider/Extender: [name] in Treatment: 16 Subjective History of Present Illness (HPI) The following HPI elements were documented for the patient's wound: Location: right first metatarsal head on the plantar aspect and the left big toe and the right fifth toe Quality: Patient reports No Pain. Severity: Patient states wound(s) are getting worse. Duration: Patient has had the wound for > 24 months prior to seeking treatment at the wound center Context: The wound would happen gradually Modifying Factors: Wound improving due to current treatment. he has been under podiatry care for over a year and a half Associated Signs and Symptoms: Patient reports having:  heaviness in both legs This 65 year old gentleman who has had a long-standing neuropathy of both feet without history of diabetes mellitus has never been worked up for his neurological problem in the last 5 years or so. Most recently he has been seen by the podiatrist Dr. [name] who was been seeing them for about a year and a half and from what I understand has been treating him for bilateral ulceration on the feet, the right being in the region of the first metatarsal head plantar aspect and the left being in the area of the left big toe. He's had various antibiotics including Levaquin recently and this was then switched to Cipro and clindamycin. Due to the nature of this wound which has been there for a long while he has been recently referred to [name] for an opinion. An MRI was done in the middle of June which showed several small abscesses near the amputation site of the right great toe with draining and open wounds with surrounding cellulitis but no evidence of septic arthritis, osteomyelitis or pyomyositis. I understand at this stage he was taken to the operating room by [name] who debrided the wound and cleaned out the abscess. I understand ABIs were done bilaterally and they were within normal limits at Dr. [name] office. The patient is a smoker and is working on quitting smoking. 07/13/2017 -- had a lower extremity arterial duplex evaluation which showed patent bilateral lower extremity arteries without evidence of hemodynamically significant stenosis. His right ABI was 1.05 and the left was 0.95 He had a lower extremity venous duplex reflux evaluation done which showed significant venous incompetence in the bilateral common femoral, saphenofemoral  junction, small saphenous vein and in the right great saphenous vein. With these findings I believe he will benefit from a consultation with the vascular surgeon regarding the endovenous ablation procedure. the patient's neurology  appointment and dermatology appointment is still pending. He has had a x-ray done by his PCP and the results are not available. However he did see [name] who I understand has ordered a bilateral MRI of his feet. 07/26/2017 -- was seen by [name] on 07/20/2017. His impression was that he presented with minimal bilateral lower extremity peripheral arterial disease, bilateral lower extremity chronic venous insufficiency (C4) and radiculopathy versus neuropathy bilateral lower extremity. No arterial intervention was needed at the present time. He recommended maximal medical management at this stage for his atherosclerotic disease . He recommended compression to be continued and no venous intervention at the present time. He was offered follow-up in the Vein clinic in 3 months time. MR of the right foot done with and without contrast -- IMPRESSION:Wound on the plantar surface of the foot at approximately the level of first MTP joint with a tiny underlying soft tissue abscess. Large area of soft tissue edema subjacent to the wound is most compatible with granulation tissue. Negative for osteomyelitis about the first MTP joint. Mild edema and enhancement in the proximal phalanx of the little toe is stable compared to the prior examination and may be due to stress change. Infection is possible but thought highly unlikely. Subcutaneous edema over the dorsum of the foot compatible with dependent change/cellulitis. the patient also has had a abdominal CT which shows a kidney mass and is going to have her MRI for this. He also has a urology opinion pending and a general surgeon's opinion regarding a colonic mass. 08/02/2017 -- his appointments with his other urology and surgical consultants is still later. He has an MRI of the left foot later this week. 08/09/2017 --MR of the left foot with and without contrast -- IMPRESSION:Large appearing skin wound on the medial aspect of the great toe without  underlying abscess or osteomyelitis.First MTP osteoarthritis. Marked marrow edema in the medial sesamoid bone is likely related to osteoarthritis but could be due to sesamoiditis. 08/16/2017 -- the patient has had a surgical opinion and a urological opinion and a extensive surgery including possible colon resection, bladder surgery and kidney surgery is being planned. No date has been set for his procedure He continues to be off smoking 08/31/2017 -- he was seen by the neurologist for his multiple problems of neuropathic and radicular symptoms in both legs with a thorough workup done. MRIs were reviewed lab work was reviewed and the assessment was that he had severe neuropathy due to multifactorial reasons and a neuropathy panel of lab work was ordered and he would be reviewed back. Patient is also due for colonoscopy to be repeated before his bladder and kidney surgery. At the present time he would like [name] to continue with conservative and symptomatic wound care for both his feet. 10/05/2017 -- since I have seen him last time he has had a colonoscopy which was fairly normal except for a polyp and he has a urology appointment pending later today. He still continues to stay off smoking. 10/26/2017-- he returns after about 3 weeks and in the meanwhile he was seen by Dr. Kellie Simmering, who reviewed his venous studies and noted that there was very minimal enlargement of the bilateral great saphenous vein on the right and somewhat larger vein on the left but no  consistent reflux throughout the great saphenous veins and there was also some right small saphenous veins which were enlarged with some reflux. However overall he thought that venous insufficiency was not significantly the cause of his ulceration and it was more of trophic ulcers and hence he recommended aggressive wound care, and at some stage he may need amputation of the left first toe and the right fifth toe. 11/09/2017 -- due to the inclement  weather he has missed his urology appointment and is still awaiting the rescheduled one. Other than that he's been doing fairly well. 11/23/2017 -- he has had his cystoscopy done by his urologist and they found multiple areas of concern and he is going to be set up for a procedure sometime later in January. He is on complex care as far as his wounds go and seizures every 2-3 weeks 12/07/17 patient presents today for evaluation concerning his right great toe amputation site as well as his left great toe ulcer. He has been tolerating the dressing changes fairly well. With that being said there does not appear to be any significant discomfort at this point that he is experiencing although he has quite significant callous especially in regard to the amputation site of the right great toe. Nonetheless he has no evidence of infection which is good news No fevers, chills, nausea, or vomiting noted at this time. He has no pain. READMISSION 05/09/18; is a patient who hasn't been here in quite some time. He is not a diabetic but he has severe idiopathic peripheral neuropathy. He was here for review of a nonhealing wound on the right great toe amputation site and a large portion of the tip of the left great toe. Sometime after his last visit here he became ill he had abdominal issues infections and required hospitalizations. He largely dropped off the map. He is putting silver alginate on the wounds. I note that he had arterial studies and saw Dr. Bridgett Larsson of vascular surgery was not felt to have an arterial issue. He also had reflux studies and he wears compression stockings and he is faithful with these. He had MRI of both of his feet that not show osteomyelitis question small abscess on the right. He tells me that the nonhealing wound on his right great toe amputation site is been there since the actual amputation which was in 2014 I note the area was aggressively debrided by Dr. Jacqualyn Posey of podiatry before he  started coming to this clinic apparently there were abscess is present at that time. His last formal arterial studies were done in August 2018 which time his ABI was 1.05 on the right and 0.95 on the left he had biphasic and triphasic waveforms on the left and monophasic and biphasic waveforms on the right. He did not have TBI's. ABIs in our clinic today were 0.8 bilaterally. The patient states he is not a diabetic. He does have idiopathic peripheral neuropathy. He is a smoker but is trying to quit with Chantix. 05/16/18; x-rays of his bilateral feet were negative for osteomyelitis. He was noted to have a chronic deformity of the head of the second metatarsal which may be due to previous osteonecrosis there was no definite underlying bone destruction to suggest osteomyelitis on either side. He came in with the extensive callus over his right great toe amputation site and the left great toe did extensive debridement last week and another extensive debridement today.patient is changing his dressing himself with silver alginate 05/30/18; still buildup of thick  callus covering thick subcutaneous tissue around both of these areas. This requires an extensive debridement on both sides. I have discussed offloading this area with the patient. I think he inverts when he walks. His foot sizes beyond what we can put in a Darco forefoot off loader or probably what we can put in a total contact cast. 06/13/18; still a remarkable buildup of callus over the right first toe amputation site. The left first toe has 2 open wounds one medially and 1 at the tip of the toe. Surrounding callus is also not back here. We've been using silver alginate. He has a darco forefoot off loader now on the right foot.his own shoe on the left foot. Would like to try a total contact cast on the right. He is making arrangements for someone to drive him here in 2 weeks. 06/29/18; still a buildup of callus over the right first toe amputation site  although not as significant as previously. The left first toe has 2 open wounds one medially and one at the tip surrounding callus as well. We've been using silver alginate. He comes in today with the right for a total contact cast which I'll use on the right 07/04/18 on evaluation today patient appears to be doing decently well in regard to the cast. Unfortunately he did have a little area that rubbed on the lateral aspect of his leg the cast actually cracked on the medial aspect this happened he states just yesterday. He has not been walking in the cast without the boot portion on. With that being said he states that he is unsure of exactly what happened with the crack. Nonetheless this does seem to have caused a little bit of rubbing on the lateral aspect I think due to the integrity of the cast being compromised. Nonetheless I advised him that if this happens in the future he needs to let us know soon as possible. Nonetheless I do feel like the cast has been of benefit some of the area on the distal portion of his great toe amputation site actually appears to be doing better. 07/12/18 on evaluation today patient appears to be doing very well in regard to his right great toe invitation site. This seems to be showing signs of excellent improvement which is great news. There does not appear to be any evidence of infection at this time. With that being said he has been tolerating the dressing changes without complication. There appears to be no evidence of infection and again this is barely open at this point. The total contact cast has been of excellent benefit for him. I believe this will likely be completely closed and ready next week that we can switch to a cast on the left foot. He states he may want to take a week's break. 07/19/18 on evaluation today patient appears to be doing better in regard to both ulceration areas. The right foot actually appears to be completely healed he still has some  callous but no open wound. The left foot/great toe actually seems to be better after last week's debridement overall the appearance of the wound is greatly improved. 08/09/18 on evaluation today patient actually appears to be doing a little bit worse in regard to the right metatarsal invitation site. He's been tolerating the dressing changes without complication. With that being said he does note that the area actually cracked open as of today when he was in the shower trying to scrub it. Nonetheless it does appear that the  Fisher that previously was open and draining has subsequently reopened and is draining again. Nonetheless as I was looking at him and performing the debridement today I did inquire about whether or not we had ever biopsy the area as I was concerned about the possibility for something along the lines of a work type virus/infection causing the excessive callous buildup. Especially since he seems to grow much more than what would just be traditionally expected by friction alone. He subsequently told me that he did have this biopsied somewhere around 1-2 years ago by Dr. Jacqualyn Posey and that it showed that he had a wart infection at that point. Nonetheless he was never referred to dermatology better Jacqualyn Posey attempted to cut it out according to the patient. Nonetheless that may be part of the big issue with what we're dealing with here and without treating the wart infection we may not actually get this to heal and stay closed. READMISSION 12/07/2018 The patient is back in clinic today essentially with wounds in the same area and condition is that I think the last 2 times he has been here. This includes the right first metatarsal head in the setting of a previous right great toe amputation and a large part of the medial part of his left great toe. The patient states he has been dealing with this for years dating back to 2005 on and off. That he is never found this to be totally healed. When he  was last in this clinic he had this biopsied shave biopsies which showed hyperkeratotic and parakeratotic debris. Epithelium with papillary features negative for malignancy. He has been using silver alginate. He has healing sandals which he uses Felt on the bottom of these. When he was here last time we put him a total contact cast at least for a while and the patient states on the right that helped with the buildup of hyperkeratotic tissue and that seems to be reflective in our notes. He also states that was previously biopsied by Dr. Jacqualyn Posey of podiatry. The patient has a severe idiopathic peripheral neuropathy which he attributes to a brown recluse spider bite in 2002 or so. He is not a diabetic The patient had arterial studies in this showed an ABI in the right of 1.05 on the left was 0.95. TBI's were not done. He had monophasic waveforms at the right posterior tibial biphasic at the dorsalis pedis. On the left he was biphasic at the posterior tibial and triphasic at the dorsalis pedis. 1/17; this is a patient with a very challenging issue was readmitted to the clinic last week. He has a large area over the first right MTP in the setting of a previous right great toe amputation. On presentation this is covered with copious amounts of hyperkeratotic thick callus nonviable tissue. When this is debridement both on this occasion and previous occasions in this clinic he has a very boggy presentation to the subcutaneous tissue which looks somewhat atypical. He has a similar area on the left great toe from the tip of the toe down the medial aspect beyond the interphalangeal joint. He's had previous MRIs of both of his feet. On the right that showed small abscesses and he was taken to the OR by podiatry for debridement and cleaning out of the abscesses. An MRI did not show osteomyelitis of the left first toe. Both the MRIs were done in 2018. He saw Dr. Vallarie Mare at the time who did not think he had  significant macrovascular disease. He  is not a diabetic but he has idiopathic peripheral neuropathy and was a significant smoker. When he was in the clinic earlier in 2019 he had biopsies done of both areas that showed hyperkeratotic material but no malignancy. X-rays I ordered last week did not show osteomyelitis on the left but did suggest osteomyelitis at the tip of the left great toe. I looked at the x-rays myself this is a subtle finding it is only seen on the lateral film. I did a very significant debridement on him last week bilaterally in both the wound areas look better. When he was previously in our clinic the area on the right did improve with a total contact cast. He dropped out of the schedule at that point I'm not exactly sure why 1/24; the biopsy I did of the area over his right first metatarsal head showed the possibility of verruca vulgaris. Superimposed changes of lichen simplex chronicus. If this is a plantar wart this would be extremely large. I think he would need topical destructive therapy and I am going to refer him to dermatology. There was no suggestion of malignancy. Previous biopsies in this clinic on both his areas did not show evidence of malignancy either. Until we get dermatology I am going to continue with silver alginate. X-rays suggested osteomyelitis of the left toe I am going to continue him on trimethoprim sulfamethoxazole 2/3; not much change in this area on the right first met head. Extensive dark eschar formation around the central area of nonviable subcutaneous tissue looking like a boggy subcutaneous tissue. Using a #10 scalpel and pickups I removed all of this. I have tried this before and this man but I plan to put him in a total contact cast next week ooUsing a #5 curette debridement of the left great toe ooHe has an appointment with dermatology at [facility] on the 25th 2/11; right first metatarsal head after last week's extensive debridement is right back  to the way it was with a nonviable subcutaneous tissue and thick surrounding eschar. The left great toe looks about the same. The patient could not have a total contact cast today because he had household problems [leaking roof] but I am not going to put a cast on him until I have information from the dermatologist about this. READMISSION 06/17/2020 Mr. Nickerson is now a 64 year old man that we have had in this clinic on at least 2 occasions previously. When he was last here in February 2020 I referred him to dermatology at [facility] for their review of large wounds on the right first metatarsal head with surrounding severe chronic hyperkeratosis. This was biopsied on at least 2 occasions in our clinic and I think at [facility] as well that did not show an underlying malignancy. I have reviewed the consult from Dr. Sharlette Dense dermatology from 01/23/2019. At that time the patient had wounds on the plantar right first metatarsal head as well as the plantar left great toe. From what he is saying the left great toe healed however he recently had to have a left TMA by Dr. 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 Dermatology at [facility] apparently referred him to Dr. Luetta Nutting of plastic surgery and an operative excision of this area was planned however apparently they ran into the pandemic. The procedure was repetitively cancel and the patient did not keep his final appointment which I think was earlier this year he was also seen in February of  this year at the wound care center in [facility] by Dr. Zigmund Daniel. They were applying Silvadene cream and Xeroform which she is essentially doing now. I see in his notes that there was still some concern about underlying malignancy although would be difficult to believe as long as this is been there that there would not be more extensive damage at this point in any case he is back in our clinic for review of this. He had an x-ray of the  foot on 01/23/2020 that was negative for osteomyelitis acute fracture. Past medical history; idiopathic peripheral neuropathy, right great toe amputation in 2011, lumbar spinal stenosis, left transmetatarsal amputation on 05/30/2020 by Dr. Sharol Given and more recently apparently a slight wound dehiscence. He also had a fracture of his left tibial plateau sometime in 2020. ABI in our clinic was 0.95 8/9-Patient returns after being established in the clinic last time, he has a new open area on the plantar aspect of his right second toe, the right foot plantar ulcer below the great toe on the right looks slightly smaller, using PolyMem. Patient attends to his surgeon for the left foot wound 8/23; this is a patient with a refractory wound over his right first metatarsal head in the setting of severe surrounding hyperkeratosis. I had referred him to dermatology subsequently came to the attention of Dr. Vernona Rieger and Dr. Zigmund Daniel at [facility]. He also had a left TMA by Dr. Sharol Given on the left foot because of osteomyelitis I believe. He is in bilateral surgical shoes. 9/13; refractory wound over the right first metatarsal head with surrounding hyperkeratosis Verrucous skin. He had a left transmetatarsal amputation by Dr. Sharol Given. We have not been following this however this apparently is still not closed. I have looked over his notes from [facility]. This is well summarized by a note from Dr. Zigmund Daniel on February 24. The patient had had several biopsies of the skin of the first toe metatarsal head. As well as the left great toe which has since been amputated. The pathology results showed Stratus corneum with hyperkeratosis and parakeratosis. An underlying verricous carcinoma could not be excluded. I think the patient was given follow-up but I will actually think that he kept his appointments. He was sent back to follow-up with him with them again in February 2021. Dr. Luetta Nutting who is the plastic surgeon recommended wound care rather than  extensive debridement. I am very doubtful that this represents any form of malignancy. This patient has been dealing with this wound for years and I think we would have had a declaration of status well before this if this were any form of malignancy. I also do not believe he has an arterial issue. ABI in the clinic here was 0.95. He has easily palpable peripheral pulses. 10/4; generally gradually better wound area over the right plantar foot first metatarsal head.Marland Kitchen He still has thick surrounding callus around the wound margins and a fibrinous debris over the wound surface although in general a lot of this looks considerably better. He has been following with orthopedics for a left amputation by Dr. Sharol Given. We have not been following this 10/18; first metatarsal head wound is still open however there is less adherent callus and thick skin around the edge of this. I think this largely has to do with aggressive debridement last time and I am going to repeat this today. He is still dealing with a wound on the left transmetatarsal amputation site this is being followed by Dr. Sharol Given. He has a surgical  shoe on the left foot we have not been following this. He is using crutches apparently reliably to offload both his feet. 11/1 right first metatarsal head. Smaller surface area considerable amount of callus around this wound. This is a chronic issue. He has had a left transmetatarsal amputation followed by Dr. Sharol Given there is still wounds in this area he is followed with Dr. Sharol Given has a surgical shoe here 11/15; right first metatarsal head I think the wound is about the same substantial surrounding callus. This is a chronic issue. He apparently has been back to the OR with Dr. Sharol Given with regards to his left foot wound/amputation site he comes in today with a new surgical wound and a wound VAC with a full canister. The relevance of this to [name] as I had really wanted to put this man in a total contact cast but I cannot  do that as long as he is in a surgical boot on the left side Objective Constitutional Sitting or standing Blood Pressure is within target range for patient.. Pulse regular and within target range for patient.Marland Kitchen Respirations regular, non-labored and within target range.. Temperature is normal and within the target range for the patient.Marland Kitchen Appears in no distress. Vitals Time Taken: 10:17 AM, Height: 80 in, Weight: 350 lbs, BMI: 38.4, Temperature: 98.2 F, Pulse: 97 bpm, Respiratory Rate: 18 breaths/min, Blood Pressure: 124/68 mmHg. General Notes: Wound exam; wound surface on the right foot metatarsal head. Circumferential thick callus. I used a #10 scalpel to remove this going into subcutaneous tissue around the wound hemostasis with silver nitrate. The surface of the wound does not look terrible. I went over this with a #10 blade as well not a lot of fibrinous debris on the surface perhaps some. Hemostasis with direct pressure Integumentary (Hair, Skin) Wound #12 status is Open. Original cause of wound was Gradually Appeared. The wound is located on the Right Metatarsal head first. The wound measures 4cm length x 0.9cm width x 0.4cm depth; 2.827cm^2 area and 1.131cm^3 volume. There is Fat Layer (Subcutaneous Tissue) exposed. There is no tunneling or undermining noted. There is a medium amount of serosanguineous drainage noted. The wound margin is thickened. There is large (67-100%) pink, pale granulation within the wound bed. There is no necrotic tissue within the wound bed. Assessment Active Problems ICD-10 Non-pressure chronic ulcer of other part of right foot with other specified severity Other idiopathic peripheral autonomic neuropathy Procedures Wound #12 Pre-procedure diagnosis of Wound #12 is a Neuropathic Ulcer-Non Diabetic located on the Right Metatarsal head first . There was a Excisional Skin/Subcutaneous Tissue Debridement with a total area of 3.6 sq cm performed by [name]., MD. With the following instrument(s): Blade to remove Viable and Non-Viable tissue/material. Material removed includes Callus and Subcutaneous Tissue and. No specimens were taken. A time out was conducted at 10:54, prior to the start of the procedure. A Moderate amount of bleeding was controlled with Silver Nitrate. The procedure was tolerated well with a pain level of 0 throughout and a pain level of 0 following the procedure. Post Debridement Measurements: 4cm length x 0.9cm width x 0.4cm depth; 1.131cm^3 volume. Character of Wound/Ulcer Post Debridement is improved. Post procedure Diagnosis Wound #12: Same as Pre-Procedure Plan Follow-up Appointments: Return Appointment in 2 weeks. Dressing Change Frequency: Wound #12 Right Metatarsal head first: Change Dressing every other day. Skin Barriers/Peri-Wound Care: Moisturizing lotion - to feet daily Wound Cleansing: Wound #12 Right Metatarsal head first: Clean wound with Wound Cleanser -  or normal saline Primary Wound Dressing: Wound #12 Right Metatarsal head first: Polymem Silver Secondary Dressing: Wound #12 Right Metatarsal head first: Kerlix/Rolled Gauze - secure with tape Dry Gauze Other: - felt callous pad or foam donut Off-Loading: Open toe surgical shoe to: - right foot Home Health: Tensed skilled nursing for wound care. - [facility] 1. I continued with the polymen silver 2. Extensive debridement of the predominantly the wound circumference to a lesser extent the wound surface. I am always able to get this to something better however the next time he is here there will probably be thick callus. I had wanted to put him in a total contact cast but the left foot procedures/offloading preclude this Electronic Signature(s) Signed: 10/13/2020 5:39:18 PM By: [name] MD Entered By: [name] on 10/13/2020 11:15:18 -------------------------------------------------------------------------------- SuperBill  Details Patient Name: Date of Service: [name], [name] 10/13/2020 Medical Record Number: [id number] Patient Account Number: [id number] Date of Birth/Sex: Treating RN: [birth year] (64 y.o. [name] Primary Care Provider: [name] Other Clinician: Referring Provider: Treating Provider/Extender: [name] in Treatment: 16 Diagnosis Coding ICD-10 Codes Code Description L97.518 Non-pressure chronic ulcer of other part of right foot with other specified severity G90.09 Other idiopathic peripheral autonomic neuropathy Facility Procedures CPT4 Code: [id number] Description: 24235 - DEB SUBQ TISSUE 20 SQ CM/< ICD-10 Diagnosis Description L97.518 Non-pressure chronic ulcer of other part of right foot with other specified sev Modifier: erity Quantity: 1 Physician Procedures : CPT4 Code Description Modifier [id number] - WC PHYS SUBQ TISS 20 SQ CM ICD-10 Diagnosis Description L97.518 Non-pressure chronic ulcer of other part of right foot with other specified severity Quantity: 1 Electronic Signature(s) Signed: 10/13/2020 5:39:18 PM By: [name] MD Entered By: [name] on 10/13/2020 11:15:33

## 2020-10-14 NOTE — Progress Notes (Signed)
FADEL, CLASON ([id number]) Visit Report for 10/13/2020 Arrival Information Details Patient Name: Date of Service: [name], [name] 10/13/2020 10:00 A M Medical Record Number: [id number] Patient Account Number: [id number] Date of Birth/Sex: Treating RN: 29-Apr-1956 (64 y.o. [name]) [name] Primary Care Nikita Humble: [name] Other Clinician: Referring Cannon Arreola: Treating Anan Dapolito/Extender: [name] in Treatment: 16 Visit Information History Since Last Visit All ordered tests and consults were completed: No Patient Arrived: Crutches Added or deleted any medications: No Arrival Time: 10:06 Any new allergies or adverse reactions: No Accompanied By: self Had a fall or experienced change in No Transfer Assistance: None activities of daily living that may affect Patient Identification Verified: Yes risk of falls: Secondary Verification Process Completed: Yes Signs or symptoms of abuse/neglect since last visito No Patient Requires Transmission-Based Precautions: No Hospitalized since last visit: No Patient Has Alerts: No Implantable device outside of the clinic excluding No cellular tissue based products placed in the center since last visit: Has Dressing in Place as Prescribed: Yes Pain Present Now: No Electronic Signature(s) Signed: 10/13/2020 5:50:45 PM By: [name] RN Entered By: [name] on 10/13/2020 10:08:02 -------------------------------------------------------------------------------- Encounter Discharge Information Details Patient Name: Date of Service: [name], [name] 10/13/2020 10:00 A M Medical Record Number: [id number] Patient Account Number: [id number] Date of Birth/Sex: Treating RN: 08-03-1956 (64 y.o. [name] Primary Care Kiowa Hollar: [name] Other Clinician: Referring Mauri Temkin: Treating Debroah Shuttleworth/Extender: [name] in Treatment: 16 Encounter Discharge Information Items  Post Procedure Vitals Discharge Condition: Stable Temperature (F): 98.2 Ambulatory Status: Crutches Pulse (bpm): 97 Discharge Destination: Home Respiratory Rate (breaths/min): 18 Transportation: Private Auto Blood Pressure (mmHg): 124/68 Accompanied By: self Schedule Follow-up Appointment: Yes Clinical Summary of Care: Electronic Signature(s) Signed: 10/13/2020 6:05:18 PM By: Deon Pilling Entered By: Deon Pilling on 10/13/2020 11:57:28 -------------------------------------------------------------------------------- Lower Extremity Assessment Details Patient Name: Date of Service: [name], [name] 10/13/2020 10:00 A M Medical Record Number: [id number] Patient Account Number: [id number] Date of Birth/Sex: Treating RN: 25-Jul-1956 (64 y.o. [name]) [name] Primary Care Jaime Grizzell: [name] Other Clinician: Referring Angeleigh Chiasson: Treating Goddess Gebbia/Extender: [name] in Treatment: 16 Edema Assessment Assessed: [Left: No] [Right: No] Edema: [Left: N] [Right: o] Calf Left: Right: Point of Measurement: From Medial Instep 42 cm Ankle Left: Right: Point of Measurement: From Medial Instep 26.5 cm Electronic Signature(s) Signed: 10/13/2020 5:50:45 PM By: [name] RN Entered By: [name] on 10/13/2020 10:18:47 -------------------------------------------------------------------------------- Multi Wound Chart Details Patient Name: Date of Service: [name], [name] 10/13/2020 10:00 A M Medical Record Number: [id number] Patient Account Number: [id number] Date of Birth/Sex: Treating RN: 1956-11-22 (64 y.o. [name] Primary Care Derius Ghosh: [name] Other Clinician: Referring Laura Caldas: Treating Marysa Wessner/Extender: [name] in Treatment: 16 Vital Signs Height(in): 80 Pulse(bpm): 38 Weight(lbs): 350 Blood Pressure(mmHg): 124/68 Body Mass Index(BMI): 38 Temperature(F): 98.2 Respiratory  Rate(breaths/min): 18 Photos: [12:No Photos Right Metatarsal head first] [N/A:N/A N/A] Wound Location: [12:Gradually Appeared] [N/A:N/A] Wounding Event: [12:Neuropathic Ulcer-Non Diabetic] [N/A:N/A] Primary Etiology: [12:Anemia, Chronic Obstructive] [N/A:N/A] Comorbid History: [12:Pulmonary Disease (COPD), Hypertension, Osteoarthritis, Osteomyelitis, Neuropathy 11/29/2018] [N/A:N/A] Date Acquired: [12:16] [N/A:N/A] Weeks of Treatment: [12:Open] [N/A:N/A] Wound Status: [12:4x0.9x0.4] [N/A:N/A] Measurements L x W x D (cm) [12:2.827] [N/A:N/A] A (cm) : rea [12:1.131] [N/A:N/A] Volume (cm) : [12:69.70%] [N/A:N/A] % Reduction in Area: [12:39.40%] [N/A:N/A] % Reduction in Volume: [12:Full Thickness Without Exposed] [N/A:N/A] Classification: [12:Support Structures Medium] [N/A:N/A] Exudate Amount: [12:Serosanguineous] [  N/A:N/A] Exudate Type: [12:red, brown] [N/A:N/A] Exudate Color: [12:Thickened] [N/A:N/A] Wound Margin: [12:Large (67-100%)] [N/A:N/A] Granulation Amount: [12:Pink, Pale] [N/A:N/A] Granulation Quality: [12:None Present (0%)] [N/A:N/A] Necrotic Amount: [12:Fat Layer (Subcutaneous Tissue): Yes N/A] Exposed Structures: [12:Fascia: No Tendon: No Muscle: No Joint: No Bone: No Small (1-33%)] [N/A:N/A] Epithelialization: [12:Debridement - Excisional] [N/A:N/A] Debridement: Pre-procedure Verification/Time Out 10:54 [N/A:N/A] Taken: [12:Callus, Subcutaneous] [N/A:N/A] Tissue Debrided: [12:Skin/Subcutaneous Tissue] [N/A:N/A] Level: [12:3.6] [N/A:N/A] Debridement A (sq cm): [12:rea Blade] [N/A:N/A] Instrument: [12:Moderate] [N/A:N/A] Bleeding: [12:Silver Nitrate] [N/A:N/A] Hemostasis A chieved: [12:0] [N/A:N/A] Procedural Pain: [12:0] [N/A:N/A] Post Procedural Pain: [12:Procedure was tolerated well] [N/A:N/A] Debridement Treatment Response: [12:4x0.9x0.4] [N/A:N/A] Post Debridement Measurements L x W x D (cm) [12:1.131] [N/A:N/A] Post Debridement Volume: (cm) [12:Debridement]  [N/A:N/A] Treatment Notes Electronic Signature(s) Signed: 10/13/2020 5:39:18 PM By: [name] MD Signed: 10/13/2020 5:51:53 PM By: Levan Hurst RN, BSN Entered By: [name] on 10/13/2020 11:11:51 -------------------------------------------------------------------------------- Multi-Disciplinary Care Plan Details Patient Name: Date of Service: [name], [name] 10/13/2020 10:00 A M Medical Record Number: [id number] Patient Account Number: [id number] Date of Birth/Sex: Treating RN: [birth year] (64 y.o. [name] Primary Care Mykeria Garman: [name] Other Clinician: Referring Hibo Blasdell: Treating Cassy Sprowl/Extender: [name] in Treatment: 16 Active Inactive Wound/Skin Impairment Nursing Diagnoses: Impaired tissue integrity Knowledge deficit related to ulceration/compromised skin integrity Goals: Patient/caregiver will verbalize understanding of skin care regimen Date Initiated: 06/17/2020 Target Resolution Date: 10/24/2020 Goal Status: Active Ulcer/skin breakdown will have a volume reduction of 30% by week 4 Date Initiated: 06/17/2020 Date Inactivated: 07/21/2020 Target Resolution Date: 07/18/2020 Goal Status: Met Interventions: Assess patient/caregiver ability to obtain necessary supplies Assess patient/caregiver ability to perform ulcer/skin care regimen upon admission and as needed Assess ulceration(s) every visit Provide education on ulcer and skin care Notes: Electronic Signature(s) Signed: 10/13/2020 5:51:53 PM By: Levan Hurst RN, BSN Entered By: Levan Hurst on 10/13/2020 12:35:54 -------------------------------------------------------------------------------- Pain Assessment Details Patient Name: Date of Service: [name], [name] 10/13/2020 10:00 A M Medical Record Number: [id number] Patient Account Number: [id number] Date of Birth/Sex: Treating RN: [birth year] (64 y.o. [name]) [name] Primary Care  Taeler Winning: [name] Other Clinician: Referring Camora Tremain: Treating Zyair Rhein/Extender: [name] in Treatment: 16 Active Problems Location of Pain Severity and Description of Pain Patient Has Paino No Site Locations Pain Management and Medication Current Pain Management: Medication: Yes Cold Application: No Rest: Yes Massage: No Activity: No T.E.N.S.: No Heat Application: No Leg drop or elevation: No Is the Current Pain Management Adequate: Inadequate How does your wound impact your activities of daily livingo Sleep: Yes Bathing: No Appetite: Yes Relationship With Others: No Bladder Continence: No Emotions: No Bowel Continence: No Work: No Toileting: No Drive: No Dressing: No Hobbies: No Electronic Signature(s) Signed: 10/13/2020 5:50:45 PM By: [name] RN Entered By: [name] on 10/13/2020 10:18:33 -------------------------------------------------------------------------------- Patient/Caregiver Education Details Patient Name: Date of Service: [name], [name] 11/15/2021andnbsp10:00 A M Medical Record Number: [id number] Patient Account Number: [id number] Date of Birth/Gender: Treating RN: [birth year] (64 y.o. [name] Primary Care Physician: [name] Other Clinician: Referring Physician: Treating Physician/Extender: [name] in Treatment: 16 Education Assessment Education Provided To: Patient Education Topics Provided Wound/Skin Impairment: Methods: Explain/Verbal Responses: State content correctly [employer]) Signed: 10/13/2020 5:51:53 PM By: Levan Hurst RN, BSN Entered By: Levan Hurst on 10/13/2020 12:36:07 -------------------------------------------------------------------------------- Wound Assessment Details Patient Name: Date of Service: [name], [name] 10/13/2020 10:00 A M Medical Record Number:  [id number] Patient Account Number:  [id number] Date of Birth/Sex: Treating RN: September 01, 1956 (64 y.o. [name]) [name] Primary Care Makaylah Oddo: [name] Other Clinician: Referring Davarius Ridener: Treating Aniya Jolicoeur/Extender: [name] in Treatment: 16 Wound Status Wound Number: 12 Primary Neuropathic Ulcer-Non Diabetic Etiology: Wound Location: Right Metatarsal head first Wound Open Wounding Event: Gradually Appeared Status: Date Acquired: 11/29/2018 Comorbid Anemia, Chronic Obstructive Pulmonary Disease (COPD), Weeks Of Treatment: 16 History: Hypertension, Osteoarthritis, Osteomyelitis, Neuropathy Clustered Wound: No Wound Measurements Length: (cm) 4 Width: (cm) 0.9 Depth: (cm) 0.4 Area: (cm) 2.827 Volume: (cm) 1.131 % Reduction in Area: 69.7% % Reduction in Volume: 39.4% Epithelialization: Small (1-33%) Tunneling: No Undermining: No Wound Description Classification: Full Thickness Without Exposed Support Structures Wound Margin: Thickened Exudate Amount: Medium Exudate Type: Serosanguineous Exudate Color: red, brown Foul Odor After Cleansing: No Slough/Fibrino No Wound Bed Granulation Amount: Large (67-100%) Exposed Structure Granulation Quality: Pink, Pale Fascia Exposed: No Necrotic Amount: None Present (0%) Fat Layer (Subcutaneous Tissue) Exposed: Yes Tendon Exposed: No Muscle Exposed: No Joint Exposed: No Bone Exposed: No Treatment Notes Wound #12 (Right Metatarsal head first) 1. Cleanse With Wound Cleanser 2. Periwound Care Skin Prep 3. Primary Dressing Applied Polymem Ag 4. Secondary Dressing Dry Gauze Roll Gauze 5. Secured With Medipore tape Notes felt dressing medipore tape used to secure down along with medipore taped gauze over the felt and primary dressing to hold all in place. Electronic Signature(s) Signed: 10/13/2020 5:50:45 PM By: [name] RN Entered By: [name] on 10/13/2020  10:19:42 -------------------------------------------------------------------------------- Vitals Details Patient Name: Date of Service: [name], [name] 10/13/2020 10:00 A M Medical Record Number: [id number] Patient Account Number: [id number] Date of Birth/Sex: Treating RN: [birth year] (64 y.o. [name]) [name] Primary Care Kassi Esteve: [name] Other Clinician: Referring Chaniqua Brisby: Treating Breanna Shorkey/Extender: [name] in Treatment: 16 Vital Signs Time Taken: 10:17 Temperature (F): 98.2 Height (in): 80 Pulse (bpm): 97 Weight (lbs): 350 Respiratory Rate (breaths/min): 18 Body Mass Index (BMI): 38.4 Blood Pressure (mmHg): 124/68 Reference Range: 80 - 120 mg / dl Electronic Signature(s) Signed: 10/13/2020 5:50:45 PM By: [name] RN Entered By: [name] on 10/13/2020 10:18:06

## 2020-10-14 NOTE — Progress Notes (Signed)
Office Visit Note   Patient: [name].           Date of Birth: 1956/07/07           MRN: [id number] Visit Date: 10/14/2020              Requested by: [name], MD [address] [place],  [place] [address] PCP: [name], MD  Chief Complaint  Patient presents with  . Left Foot - Routine Post Op    10/08/20 revision left transmet amputation       HPI: This is a pleasant gentleman who is 6 days status post revision transmetatarsal amputation.  He presents today because his wound VAC started beeping.  Assessment & Plan: Visit Diagnoses: No diagnosis found.  Plan: Patient was given a refill of his pain medication.  Should begin to do daily dry dressing changes.  May cleanse with Dial soap and water.  He will start with using his nitroglycerin patch again and his Trental.  Emphasized the importance of elevating his foot to healthy swelling  Follow-Up Instructions: No follow-ups on file.   Ortho Exam  Patient is alert, oriented, no adenopathy, well-dressed, normal affect, normal respiratory effort. Moderate amount of soft tissue swelling skin maceration on the plantar aspect of the wound wound is well opposed wound edges.  No cellulitis mild foul odor after removing wound VAC  Imaging: No results found. No images are attached to the encounter.  Labs: Lab Results  Component Value Date   HGBA1C 6.0 (H) 08/23/2017   HGBA1C 5.3 03/12/2014   HGBA1C 5.4 07/04/2012   ESRSEDRATE 14 06/14/2017   ESRSEDRATE 30 05/17/2017   ESRSEDRATE 23 (H) 01/31/2015   CRP 23.4 (H) 06/14/2017   CRP 37.4 (H) 05/17/2017   CRP 55.8 (H) 03/29/2017   REPTSTATUS 09/17/2010 FINAL 08/03/2010   GRAMSTAIN  08/02/2010    FEW WBC PRESENT, PREDOMINANTLY PMN NO SQUAMOUS EPITHELIAL CELLS SEEN RARE GRAM NEGATIVE RODS   CULT NO ACID FAST BACILLI ISOLATED IN 6 WEEKS 08/03/2010     Lab Results  Component Value Date   ALBUMIN 4.0 03/12/2014   ALBUMIN 3.0 (L) 07/04/2012   ALBUMIN  1.7 (L) 08/02/2010   PREALBUMIN 4.3 (L) 08/02/2010    Lab Results  Component Value Date   MG 2.4 07/04/2012   MG 2.2 08/01/2010   No results found for: VD25OH  Lab Results  Component Value Date   PREALBUMIN 4.3 (L) 08/02/2010   CBC EXTENDED Latest Ref Rng & Units 10/08/2020 05/30/2020 01/02/2018  WBC 4.0 - 10.5 K/uL 8.7 9.0 12.4(H)  RBC 4.22 - 5.81 MIL/uL 4.84 4.67 4.16(L)  HGB 13.0 - 17.0 g/dL 14.4 13.6 12.5(L)  HCT 39 - 52 % 46.9 43.3 38.4(L)  PLT 150 - 400 K/uL 323 445(H) 223  NEUTROABS 1.40 - 7.00 x10E3/uL - - -  LYMPHSABS 0 - 3 x10E3/uL - - -     There is no height or weight on file to calculate BMI.  Orders:  No orders of the defined types were placed in this encounter.  Meds ordered this encounter  Medications  . oxyCODONE (OXY IR/ROXICODONE) 5 MG immediate release tablet    Sig: Take 1 tablet (5 mg total) by mouth every 4 (four) hours as needed for moderate pain (pain score 4-6).    Dispense:  30 tablet    Refill:  0     Procedures: No procedures performed  Clinical Data: No additional findings.  ROS:  All other systems  negative, except as noted in the HPI. Review of Systems  Objective: Vital Signs: There were no vitals taken for this visit.  Specialty Comments:  No specialty comments available.  PMFS History: Patient Active Problem List   Diagnosis Date Noted  . Dehiscence of amputation stump ([facility])   . Abscess of left foot 05/30/2020  . Subacute osteomyelitis, left ankle and foot ([facility])   . Skin ulcers of both feet ([facility]) 10/24/2017  . Colovesical fistula   . Benign neoplasm of cecum   . Chronic venous insufficiency 07/20/2017  . Chronic left-sided low back pain without sciatica 11/08/2016  . Toe ulcer ([place]) 03/08/2016  . Verruca 12/28/2015  . Pre-ulcerative calluses 12/28/2015  . History of adenomatous polyp of colon 06/09/2015  . Lipoma 03/13/2014  . Plantar warts 07/09/2012  . Cellulitis 07/04/2012  . Neuropathy 07/04/2012  .  Hyperglycemia 07/04/2012  . HYPERTENSION, BENIGN 10/20/2010  . EDEMA 08/20/2010  . WISDOM TEETH EXTRACTION, HX OF 08/20/2010  . HYPOALBUMINEMIA 08/19/2010  . ANEMIA 08/19/2010  . NICOTINE ADDICTION 08/19/2010  . NECROTIZING PNEUMONIA 08/19/2010  . OSTEOARTHRITIS 08/19/2010   Past Medical History:  Diagnosis Date  . Arthritis   . Cancer ([facility])    kidney  . Chronic kidney disease    mass r kidney - partial nephrectomy  . COPD (chronic obstructive pulmonary disease) ([facility])   . Diverticulitis   . History of kidney stones   . Hx of adenomatous colonic polyps 06/09/2015  . Hyperlipidemia   . Hypertension    has previously been on medication but then taken off - 05/29/20 pt states he's never been treated for HTN  . Neuromuscular disorder ([facility])    neuropathy lower legs and feet  . Neuropathy    in both feet  . Pneumonia 2011    Family History  Problem Relation Age of Onset  . Heart disease Mother   . Esophageal cancer Mother   . Diabetes Father   . Pancreatic cancer Father   . Breast cancer Sister   . Colon cancer Neg Hx   . Rectal cancer Neg Hx   . Stomach cancer Neg Hx   . Colon polyps Neg Hx     Past Surgical History:  Procedure Laterality Date  . AMPUTATION Bilateral 06/05/2013   Procedure: RIGHT GREAT TOE AMPUTATION/LEFT GREAT TOE DEBRIDEMENT;  Surgeon: Wylene Simmer, MD;  Location: [facility];  Service: Orthopedics;  Laterality: Bilateral;  . AMPUTATION Left 05/30/2020   Procedure: LEFT TRANSMETATARSAL AMPUTATION;  Surgeon: Newt Minion, MD;  Location: [facility];  Service: Orthopedics;  Laterality: Left;  . APPLICATION OF WOUND VAC Left 05/30/2020   Procedure: APPLICATION OF WOUND VAC;  Surgeon: Newt Minion, MD;  Location: [facility];  Service: Orthopedics;  Laterality: Left;  . COLON SURGERY     robotic partial colectomy Dr. Marcello Moores 12-30-17  . COLONOSCOPY     x 2 last date 09/21/2017 bladder attached to colon eval  . COLONOSCOPY WITH PROPOFOL N/A 09/21/2017   Procedure: COLONOSCOPY WITH  PROPOFOL;  Surgeon: [name], MD;  [facility];  Service: Endoscopy;  Laterality: N/A;  . MOUTH SURGERY    . PARTIAL NEPHRECTOMY     right Dr. Leighton Ruff 06-04-27  . ROBOTIC ASSITED PARTIAL NEPHRECTOMY Right 12/30/2017   Procedure: XI ROBOTIC ASSITED RIGHT PARTIAL NEPHRECTOMY;  Surgeon: Alexis Frock, MD;  [facility];  Service: Urology;  Laterality: Right;  . STUMP REVISION Left 10/08/2020   Procedure: REVISION LEFT TRANSMETATARSAL AMPUTATION;  Surgeon: Sharol Given,  Illene Regulus, MD;  Location: [facility];  Service: Orthopedics;  Laterality: Left;  . TOE AMPUTATION Right 06/05/2013   RIGHT GREAT TOE PARTIAL AMPUTATION    . TOOTH EXTRACTION  2011   multiple teeth extracted   Social History   Occupational History  . Occupation: retired  Tobacco Use  . Smoking status: Current Some Day Smoker    Packs/day: 0.20    Years: 43.00    Pack years: 8.60    Types: Cigarettes    Start date: 11/29/1973  . Smokeless tobacco: Never Used  . Tobacco comment: occasional smoker - ~1 pack/week. smoked ~1.5ppd until 5 yr ago  Vaping Use  . Vaping Use: Never used  Substance and Sexual Activity  . Alcohol use: No    Alcohol/week: 0.0 standard drinks  . Drug use: No  . Sexual activity: Not Currently    Partners: Female

## 2020-10-17 ENCOUNTER — Ambulatory Visit: Payer: Medicare PPO | Admitting: Family

## 2020-10-17 DIAGNOSIS — G9009 Other idiopathic peripheral autonomic neuropathy: Secondary | ICD-10-CM | POA: Diagnosis not present

## 2020-10-17 DIAGNOSIS — L97518 Non-pressure chronic ulcer of other part of right foot with other specified severity: Secondary | ICD-10-CM | POA: Diagnosis not present

## 2020-10-17 DIAGNOSIS — I129 Hypertensive chronic kidney disease with stage 1 through stage 4 chronic kidney disease, or unspecified chronic kidney disease: Secondary | ICD-10-CM | POA: Diagnosis not present

## 2020-10-17 DIAGNOSIS — K5792 Diverticulitis of intestine, part unspecified, without perforation or abscess without bleeding: Secondary | ICD-10-CM | POA: Diagnosis not present

## 2020-10-17 DIAGNOSIS — M86672 Other chronic osteomyelitis, left ankle and foot: Secondary | ICD-10-CM | POA: Diagnosis not present

## 2020-10-17 DIAGNOSIS — N189 Chronic kidney disease, unspecified: Secondary | ICD-10-CM | POA: Diagnosis not present

## 2020-10-17 DIAGNOSIS — T8781 Dehiscence of amputation stump: Secondary | ICD-10-CM | POA: Diagnosis not present

## 2020-10-17 DIAGNOSIS — T8744 Infection of amputation stump, left lower extremity: Secondary | ICD-10-CM | POA: Diagnosis not present

## 2020-10-17 DIAGNOSIS — J449 Chronic obstructive pulmonary disease, unspecified: Secondary | ICD-10-CM | POA: Diagnosis not present

## 2020-10-21 ENCOUNTER — Encounter: Payer: Self-pay | Admitting: Physician Assistant

## 2020-10-21 ENCOUNTER — Telehealth: Payer: Self-pay

## 2020-10-21 ENCOUNTER — Ambulatory Visit (INDEPENDENT_AMBULATORY_CARE_PROVIDER_SITE_OTHER): Payer: Medicare PPO | Admitting: Orthopedic Surgery

## 2020-10-21 VITALS — Ht >= 80 in | Wt 345.0 lb

## 2020-10-21 DIAGNOSIS — Z89432 Acquired absence of left foot: Secondary | ICD-10-CM

## 2020-10-21 MED ORDER — OXYCODONE HCL 5 MG PO TABS: 5.0000 mg | ORAL_TABLET | ORAL | 0 refills | Status: DC | PRN

## 2020-10-21 NOTE — Telephone Encounter (Signed)
Received vm from [facility] stating pt recently had surgery and was supposed to have a shower chair and wanting to know if one was ordered for him. [phone]

## 2020-10-21 NOTE — Progress Notes (Signed)
Office Visit Note   Patient: [name].           Date of Birth: 07-18-1956           MRN: [id number] Visit Date: 10/21/2020              Requested by: [name], MD [address] [place],  [place] [address] PCP: [name], MD  Chief Complaint  Patient presents with  . Left Foot - Routine Post Op    10/08/20 revision left transmet amputation       HPI: Patient is a 64 year old gentleman who is 2 weeks status post revision left transmetatarsal amputation.  Patient states he has had wound dehiscence with a small amount of serosanguineous drainage she complains of swelling he is weightbearing in a fracture boot and crutches and using a nitroglycerin patch and dry dressing.  Assessment & Plan: Visit Diagnoses:  1. History of transmetatarsal amputation of left foot ([facility])     Plan: We will have patient use a full nitroglycerin patch change daily, change the dressing 2 times a day and minimize weightbearing.  Follow-Up Instructions: Return in about 2 weeks (around 11/04/2020).   Ortho Exam  Patient is alert, oriented, no adenopathy, well-dressed, normal affect, normal respiratory effort. Examination patient has a strong dorsalis pedis pulse examination there is wound dehiscence but no gangrenous changes the wound is debrided and there is no good granulation tissue.  There is no purulent drainage there is minimal serosanguineous drainage.  Imaging: No results found. No images are attached to the encounter.  Labs: Lab Results  Component Value Date   HGBA1C 6.0 (H) 08/23/2017   HGBA1C 5.3 03/12/2014   HGBA1C 5.4 07/04/2012   ESRSEDRATE 14 06/14/2017   ESRSEDRATE 30 05/17/2017   ESRSEDRATE 23 (H) 01/31/2015   CRP 23.4 (H) 06/14/2017   CRP 37.4 (H) 05/17/2017   CRP 55.8 (H) 03/29/2017   REPTSTATUS 09/17/2010 FINAL 08/03/2010   GRAMSTAIN  08/02/2010    FEW WBC PRESENT, PREDOMINANTLY PMN NO SQUAMOUS EPITHELIAL CELLS SEEN RARE GRAM NEGATIVE RODS   CULT  NO ACID FAST BACILLI ISOLATED IN 6 WEEKS 08/03/2010     Lab Results  Component Value Date   ALBUMIN 4.0 03/12/2014   ALBUMIN 3.0 (L) 07/04/2012   ALBUMIN 1.7 (L) 08/02/2010   PREALBUMIN 4.3 (L) 08/02/2010    Lab Results  Component Value Date   MG 2.4 07/04/2012   MG 2.2 08/01/2010   No results found for: VD25OH  Lab Results  Component Value Date   PREALBUMIN 4.3 (L) 08/02/2010   CBC EXTENDED Latest Ref Rng & Units 10/08/2020 05/30/2020 01/02/2018  WBC 4.0 - 10.5 K/uL 8.7 9.0 12.4(H)  RBC 4.22 - 5.81 MIL/uL 4.84 4.67 4.16(L)  HGB 13.0 - 17.0 g/dL 14.4 13.6 12.5(L)  HCT 39 - 52 % 46.9 43.3 38.4(L)  PLT 150 - 400 K/uL 323 445(H) 223  NEUTROABS 1.40 - 7.00 x10E3/uL - - -  LYMPHSABS 0 - 3 x10E3/uL - - -     Body mass index is 37.9 kg/m.  Orders:  No orders of the defined types were placed in this encounter.  Meds ordered this encounter  Medications  . oxyCODONE (OXY IR/ROXICODONE) 5 MG immediate release tablet    Sig: Take 1 tablet (5 mg total) by mouth every 4 (four) hours as needed for moderate pain (pain score 4-6).    Dispense:  30 tablet    Refill:  0     Procedures: No  procedures performed  Clinical Data: No additional findings.  ROS:  All other systems negative, except as noted in the HPI. Review of Systems  Objective: Vital Signs: Ht 6\' 8"  (2.032 m)   Wt (!) 345 lb (156.5 kg)   BMI 37.90 kg/m   Specialty Comments:  No specialty comments available.  PMFS History: Patient Active Problem List   Diagnosis Date Noted  . Dehiscence of amputation stump ([facility])   . Abscess of left foot 05/30/2020  . Subacute osteomyelitis, left ankle and foot ([facility])   . Skin ulcers of both feet ([facility]) 10/24/2017  . Colovesical fistula   . Benign neoplasm of cecum   . Chronic venous insufficiency 07/20/2017  . Chronic left-sided low back pain without sciatica 11/08/2016  . Toe ulcer ([facility]) 03/08/2016  . Verruca 12/28/2015  . Pre-ulcerative calluses 12/28/2015  .  History of adenomatous polyp of colon 06/09/2015  . Lipoma 03/13/2014  . Plantar warts 07/09/2012  . Cellulitis 07/04/2012  . Neuropathy 07/04/2012  . Hyperglycemia 07/04/2012  . HYPERTENSION, BENIGN 10/20/2010  . EDEMA 08/20/2010  . WISDOM TEETH EXTRACTION, HX OF 08/20/2010  . HYPOALBUMINEMIA 08/19/2010  . ANEMIA 08/19/2010  . NICOTINE ADDICTION 08/19/2010  . NECROTIZING PNEUMONIA 08/19/2010  . OSTEOARTHRITIS 08/19/2010   Past Medical History:  Diagnosis Date  . Arthritis   . Cancer ([facility])    kidney  . Chronic kidney disease    mass r kidney - partial nephrectomy  . COPD (chronic obstructive pulmonary disease) ([facility])   . Diverticulitis   . History of kidney stones   . Hx of adenomatous colonic polyps 06/09/2015  . Hyperlipidemia   . Hypertension    has previously been on medication but then taken off - 05/29/20 pt states he's never been treated for HTN  . Neuromuscular disorder ([facility])    neuropathy lower legs and feet  . Neuropathy    in both feet  . Pneumonia 2011    Family History  Problem Relation Age of Onset  . Heart disease Mother   . Esophageal cancer Mother   . Diabetes Father   . Pancreatic cancer Father   . Breast cancer Sister   . Colon cancer Neg Hx   . Rectal cancer Neg Hx   . Stomach cancer Neg Hx   . Colon polyps Neg Hx     Past Surgical History:  Procedure Laterality Date  . AMPUTATION Bilateral 06/05/2013   Procedure: RIGHT GREAT TOE AMPUTATION/LEFT GREAT TOE DEBRIDEMENT;  Surgeon: Wylene Simmer, MD;  Location: [facility];  Service: Orthopedics;  Laterality: Bilateral;  . AMPUTATION Left 05/30/2020   Procedure: LEFT TRANSMETATARSAL AMPUTATION;  Surgeon: Newt Minion, MD;  Location: [facility];  Service: Orthopedics;  Laterality: Left;  . APPLICATION OF WOUND VAC Left 05/30/2020   Procedure: APPLICATION OF WOUND VAC;  Surgeon: Newt Minion, MD;  Location: [facility];  Service: Orthopedics;  Laterality: Left;  . COLON SURGERY     robotic partial colectomy Dr. Marcello Moores  12-30-17  . COLONOSCOPY     x 2 last date 09/21/2017 bladder attached to colon eval  . COLONOSCOPY WITH PROPOFOL N/A 09/21/2017   Procedure: COLONOSCOPY WITH PROPOFOL;  Surgeon: [name], MD;  [facility];  Service: Endoscopy;  Laterality: N/A;  . MOUTH SURGERY    . PARTIAL NEPHRECTOMY     right Dr. Leighton Ruff 03-02-00  . ROBOTIC ASSITED PARTIAL NEPHRECTOMY Right 12/30/2017   Procedure: XI ROBOTIC ASSITED RIGHT PARTIAL NEPHRECTOMY;  Surgeon: Alexis Frock, MD;  [facility];  Service: Urology;  Laterality: Right;  . STUMP REVISION Left 10/08/2020   Procedure: REVISION LEFT TRANSMETATARSAL AMPUTATION;  Surgeon: Newt Minion, MD;  Location: [facility];  Service: Orthopedics;  Laterality: Left;  . TOE AMPUTATION Right 06/05/2013   RIGHT GREAT TOE PARTIAL AMPUTATION    . TOOTH EXTRACTION  2011   multiple teeth extracted   Social History   Occupational History  . Occupation: retired  Tobacco Use  . Smoking status: Current Some Day Smoker    Packs/day: 0.20    Years: 43.00    Pack years: 8.60    Types: Cigarettes    Start date: 11/29/1973  . Smokeless tobacco: Never Used  . Tobacco comment: occasional smoker - ~1 pack/week. smoked ~1.5ppd until 5 yr ago  Vaping Use  . Vaping Use: Never used  Substance and Sexual Activity  . Alcohol use: No    Alcohol/week: 0.0 standard drinks  . Drug use: No  . Sexual activity: Not Currently    Partners: Female

## 2020-10-21 NOTE — Telephone Encounter (Signed)
I called and [facility] to advise that I was not aware that the pt needed a shower chair and that I put in the order just now through [facility] and that they will contact the pt to discuss any out of pocket expense there may be and delivery options. To call with any other questions.

## 2020-10-24 DIAGNOSIS — T8781 Dehiscence of amputation stump: Secondary | ICD-10-CM | POA: Diagnosis not present

## 2020-10-24 DIAGNOSIS — N189 Chronic kidney disease, unspecified: Secondary | ICD-10-CM | POA: Diagnosis not present

## 2020-10-24 DIAGNOSIS — T8744 Infection of amputation stump, left lower extremity: Secondary | ICD-10-CM | POA: Diagnosis not present

## 2020-10-24 DIAGNOSIS — L97518 Non-pressure chronic ulcer of other part of right foot with other specified severity: Secondary | ICD-10-CM | POA: Diagnosis not present

## 2020-10-24 DIAGNOSIS — J449 Chronic obstructive pulmonary disease, unspecified: Secondary | ICD-10-CM | POA: Diagnosis not present

## 2020-10-24 DIAGNOSIS — G9009 Other idiopathic peripheral autonomic neuropathy: Secondary | ICD-10-CM | POA: Diagnosis not present

## 2020-10-24 DIAGNOSIS — M86672 Other chronic osteomyelitis, left ankle and foot: Secondary | ICD-10-CM | POA: Diagnosis not present

## 2020-10-24 DIAGNOSIS — I129 Hypertensive chronic kidney disease with stage 1 through stage 4 chronic kidney disease, or unspecified chronic kidney disease: Secondary | ICD-10-CM | POA: Diagnosis not present

## 2020-10-24 DIAGNOSIS — K5792 Diverticulitis of intestine, part unspecified, without perforation or abscess without bleeding: Secondary | ICD-10-CM | POA: Diagnosis not present

## 2020-10-27 ENCOUNTER — Telehealth: Payer: Self-pay | Admitting: Physician Assistant

## 2020-10-27 ENCOUNTER — Other Ambulatory Visit: Payer: Self-pay

## 2020-10-27 ENCOUNTER — Encounter (HOSPITAL_BASED_OUTPATIENT_CLINIC_OR_DEPARTMENT_OTHER): Payer: Medicare PPO | Admitting: Internal Medicine

## 2020-10-27 ENCOUNTER — Other Ambulatory Visit: Payer: Self-pay | Admitting: Physician Assistant

## 2020-10-27 DIAGNOSIS — G9009 Other idiopathic peripheral autonomic neuropathy: Secondary | ICD-10-CM | POA: Diagnosis not present

## 2020-10-27 DIAGNOSIS — L97518 Non-pressure chronic ulcer of other part of right foot with other specified severity: Secondary | ICD-10-CM | POA: Diagnosis not present

## 2020-10-27 MED ORDER — OXYCODONE HCL 5 MG PO TABS: 5.0000 mg | ORAL_TABLET | ORAL | 0 refills | Status: DC | PRN

## 2020-10-27 NOTE — Telephone Encounter (Signed)
done

## 2020-10-27 NOTE — Telephone Encounter (Signed)
Patient called requesting a refill of oxycodone. Please send to pharmacy on file. Patient phone number is [phone].

## 2020-10-27 NOTE — Telephone Encounter (Signed)
10/08/20 revision transmet amputation pt is requesting refill on pain medication. Please advise.

## 2020-10-27 NOTE — Progress Notes (Signed)
Cory, [name] ([id number]) Visit Report for 10/27/2020 HPI Details Patient Name: Date of Service: [name], [name] 10/27/2020 10:30 A M Medical Record Number: [id number] Patient Account Number: [id number] Date of Birth/Sex: Treating RN: 03-Apr-1956 (64 y.o. [name] Primary Care Provider: [name] Other Clinician: Referring Provider: Treating Provider/Extender: [name] in Treatment: 18 History of Present Illness Location: right first metatarsal head on the plantar aspect and the left big toe and the right fifth toe Quality: Patient reports No Pain. Severity: Patient states wound(s) are getting worse. Duration: Patient has had the wound for > 24 months prior to seeking treatment at the wound center Context: The wound would happen gradually Modifying Factors: Wound improving due to current treatment. he has been under podiatry care for over a year and a half ssociated Signs and Symptoms: Patient reports having: heaviness in both legs A HPI Description: This 64 year old gentleman who has had a long-standing neuropathy of both feet without history of diabetes mellitus has never been worked up for his neurological problem in the last 5 years or so. Most recently he has been seen by the podiatrist Dr. [name] who was been seeing them for about a year and a half and from what I understand has been treating him for bilateral ulceration on the feet, the right being in the region of the first metatarsal head plantar aspect and the left being in the area of the left big toe. He's had various antibiotics including Levaquin recently and this was then switched to Cipro and clindamycin. Due to the nature of this wound which has been there for a long while he has been recently referred to [name] for an opinion. An MRI was done in the middle of June which showed several small abscesses near the amputation site of the right great toe with draining and  open wounds with surrounding cellulitis but no evidence of septic arthritis, osteomyelitis or pyomyositis. I understand at this stage he was taken to the operating room by [name] who debrided the wound and cleaned out the abscess. I understand ABIs were done bilaterally and they were within normal limits at Dr. [name] office. The patient is a smoker and is working on quitting smoking. 07/13/2017 -- had a lower extremity arterial duplex evaluation which showed patent bilateral lower extremity arteries without evidence of hemodynamically significant stenosis. His right ABI was 1.05 and the left was 0.95 He had a lower extremity venous duplex reflux evaluation done which showed significant venous incompetence in the bilateral common femoral, saphenofemoral junction, small saphenous vein and in the right great saphenous vein. With these findings I believe he will benefit from a consultation with the vascular surgeon regarding the endovenous ablation procedure. the patient's neurology appointment and dermatology appointment is still pending. He has had a x-ray done by his PCP and the results are not available. However he did see [name] who I understand has ordered a bilateral MRI of his feet. 07/26/2017 -- was seen by [name] on 07/20/2017. His impression was that he presented with minimal bilateral lower extremity peripheral arterial disease, bilateral lower extremity chronic venous insufficiency (C4) and radiculopathy versus neuropathy bilateral lower extremity. No arterial intervention was needed at the present time. He recommended maximal medical management at this stage for his atherosclerotic disease . He recommended compression to be continued and no venous intervention at the present time. He was offered follow-up in the Vein clinic in 3 months time. MR  of the right foot done with and without contrast -- IMPRESSION:Wound on the plantar surface of the foot at approximately the  level of first MTP joint with a tiny underlying soft tissue abscess. Large area of soft tissue edema subjacent to the wound is most compatible with granulation tissue. Negative for osteomyelitis about the first MTP joint. Mild edema and enhancement in the proximal phalanx of the little toe is stable compared to the prior examination and may be due to stress change. Infection is possible but thought highly unlikely. Subcutaneous edema over the dorsum of the foot compatible with dependent change/cellulitis. the patient also has had a abdominal CT which shows a kidney mass and is going to have her MRI for this. He also has a urology opinion pending and a general surgeon's opinion regarding a colonic mass. 08/02/2017 -- his appointments with his other urology and surgical consultants is still later. He has an MRI of the left foot later this week. 08/09/2017 --MR of the left foot with and without contrast -- IMPRESSION:Large appearing skin wound on the medial aspect of the great toe without underlying abscess or osteomyelitis.First MTP osteoarthritis. Marked marrow edema in the medial sesamoid bone is likely related to osteoarthritis but could be due to sesamoiditis. 08/16/2017 -- the patient has had a surgical opinion and a urological opinion and a extensive surgery including possible colon resection, bladder surgery and kidney surgery is being planned. No date has been set for his procedure He continues to be off smoking 08/31/2017 -- he was seen by the neurologist for his multiple problems of neuropathic and radicular symptoms in both legs with a thorough workup done. MRIs were reviewed lab work was reviewed and the assessment was that he had severe neuropathy due to multifactorial reasons and a neuropathy panel of lab work was ordered and he would be reviewed back. Patient is also due for colonoscopy to be repeated before his bladder and kidney surgery. At the present time he would like [name] to  continue with conservative and symptomatic wound care for both his feet. 10/05/2017 -- since I have seen him last time he has had a colonoscopy which was fairly normal except for a polyp and he has a urology appointment pending later today. He still continues to stay off smoking. 10/26/2017-- he returns after about 3 weeks and in the meanwhile he was seen by Dr. Kellie Simmering, who reviewed his venous studies and noted that there was very minimal enlargement of the bilateral great saphenous vein on the right and somewhat larger vein on the left but no consistent reflux throughout the great saphenous veins and there was also some right small saphenous veins which were enlarged with some reflux. However overall he thought that venous insufficiency was not significantly the cause of his ulceration and it was more of trophic ulcers and hence he recommended aggressive wound care, and at some stage he may need amputation of the left first toe and the right fifth toe. 11/09/2017 -- due to the inclement weather he has missed his urology appointment and is still awaiting the rescheduled one. Other than that he's been doing fairly well. 11/23/2017 -- he has had his cystoscopy done by his urologist and they found multiple areas of concern and he is going to be set up for a procedure sometime later in January. He is on complex care as far as his wounds go and seizures every 2-3 weeks 12/07/17 patient presents today for evaluation concerning his right great toe amputation site as  well as his left great toe ulcer. He has been tolerating the dressing changes fairly well. With that being said there does not appear to be any significant discomfort at this point that he is experiencing although he has quite significant callous especially in regard to the amputation site of the right great toe. Nonetheless he has no evidence of infection which is good news No fevers, chills, nausea, or vomiting noted at this time. He has no  pain. READMISSION 05/09/18; is a patient who hasn't been here in quite some time. He is not a diabetic but he has severe idiopathic peripheral neuropathy. He was here for review of a nonhealing wound on the right great toe amputation site and a large portion of the tip of the left great toe. Sometime after his last visit here he became ill he had abdominal issues infections and required hospitalizations. He largely dropped off the map. He is putting silver alginate on the wounds. I note that he had arterial studies and saw Dr. Bridgett Larsson of vascular surgery was not felt to have an arterial issue. He also had reflux studies and he wears compression stockings and he is faithful with these. He had MRI of both of his feet that not show osteomyelitis question small abscess on the right. He tells me that the nonhealing wound on his right great toe amputation site is been there since the actual amputation which was in 2014 I note the area was aggressively debrided by Dr. Jacqualyn Posey of podiatry before he started coming to this clinic apparently there were abscess is present at that time. His last formal arterial studies were done in August 2018 which time his ABI was 1.05 on the right and 0.95 on the left he had biphasic and triphasic waveforms on the left and monophasic and biphasic waveforms on the right. He did not have TBI's. ABIs in our clinic today were 0.8 bilaterally. The patient states he is not a diabetic. He does have idiopathic peripheral neuropathy. He is a smoker but is trying to quit with Chantix. 05/16/18; x-rays of his bilateral feet were negative for osteomyelitis. He was noted to have a chronic deformity of the head of the second metatarsal which may be due to previous osteonecrosis there was no definite underlying bone destruction to suggest osteomyelitis on either side. He came in with the extensive callus over his right great toe amputation site and the left great toe did extensive debridement last  week and another extensive debridement today.patient is changing his dressing himself with silver alginate 05/30/18; still buildup of thick callus covering thick subcutaneous tissue around both of these areas. This requires an extensive debridement on both sides. I have discussed offloading this area with the patient. I think he inverts when he walks. His foot sizes beyond what we can put in a Darco forefoot off loader or probably what we can put in a total contact cast. 06/13/18; still a remarkable buildup of callus over the right first toe amputation site. The left first toe has 2 open wounds one medially and 1 at the tip of the toe. Surrounding callus is also not back here. We've been using silver alginate. He has a darco forefoot off loader now on the right foot.his own shoe on the left foot. Would like to try a total contact cast on the right. He is making arrangements for someone to drive him here in 2 weeks. 06/29/18; still a buildup of callus over the right first toe amputation site although not  as significant as previously. The left first toe has 2 open wounds one medially and one at the tip surrounding callus as well. We've been using silver alginate. He comes in today with the right for a total contact cast which I'll use on the right 07/04/18 on evaluation today patient appears to be doing decently well in regard to the cast. Unfortunately he did have a little area that rubbed on the lateral aspect of his leg the cast actually cracked on the medial aspect this happened he states just yesterday. He has not been walking in the cast without the boot portion on. With that being said he states that he is unsure of exactly what happened with the crack. Nonetheless this does seem to have caused a little bit of rubbing on the lateral aspect I think due to the integrity of the cast being compromised. Nonetheless I advised him that if this happens in the future he needs to let us know soon as possible.  Nonetheless I do feel like the cast has been of benefit some of the area on the distal portion of his great toe amputation site actually appears to be doing better. 07/12/18 on evaluation today patient appears to be doing very well in regard to his right great toe invitation site. This seems to be showing signs of excellent improvement which is great news. There does not appear to be any evidence of infection at this time. With that being said he has been tolerating the dressing changes without complication. There appears to be no evidence of infection and again this is barely open at this point. The total contact cast has been of excellent benefit for him. I believe this will likely be completely closed and ready next week that we can switch to a cast on the left foot. He states he may want to take a week's break. 07/19/18 on evaluation today patient appears to be doing better in regard to both ulceration areas. The right foot actually appears to be completely healed he still has some callous but no open wound. The left foot/great toe actually seems to be better after last week's debridement overall the appearance of the wound is greatly improved. 08/09/18 on evaluation today patient actually appears to be doing a little bit worse in regard to the right metatarsal invitation site. He's been tolerating the dressing changes without complication. With that being said he does note that the area actually cracked open as of today when he was in the shower trying to scrub it. Nonetheless it does appear that the Caryn Section that previously was open and draining has subsequently reopened and is draining again. Nonetheless as I was looking at him and performing the debridement today I did inquire about whether or not we had ever biopsy the area as I was concerned about the possibility for something along the lines of a work type virus/infection causing the excessive callous buildup. Especially since he seems to grow  much more than what would just be traditionally expected by friction alone. He subsequently told me that he did have this biopsied somewhere around 1-2 years ago by Dr. Jacqualyn Posey and that it showed that he had a wart infection at that point. Nonetheless he was never referred to dermatology better Jacqualyn Posey attempted to cut it out according to the patient. Nonetheless that may be part of the big issue with what we're dealing with here and without treating the wart infection we may not actually get this to heal and stay closed.  READMISSION 12/07/2018 The patient is back in clinic today essentially with wounds in the same area and condition is that I think the last 2 times he has been here. This includes the right first metatarsal head in the setting of a previous right great toe amputation and a large part of the medial part of his left great toe. The patient states he has been dealing with this for years dating back to 2005 on and off. That he is never found this to be totally healed. When he was last in this clinic he had this biopsied shave biopsies which showed hyperkeratotic and parakeratotic debris. Epithelium with papillary features negative for malignancy. He has been using silver alginate. He has healing sandals which he uses Felt on the bottom of these. When he was here last time we put him a total contact cast at least for a while and the patient states on the right that helped with the buildup of hyperkeratotic tissue and that seems to be reflective in our notes. He also states that was previously biopsied by Dr. Jacqualyn Posey of podiatry. The patient has a severe idiopathic peripheral neuropathy which he attributes to a brown recluse spider bite in 2002 or so. He is not a diabetic The patient had arterial studies in this showed an ABI in the right of 1.05 on the left was 0.95. TBI's were not done. He had monophasic waveforms at the right posterior tibial biphasic at the dorsalis pedis. On the left he  was biphasic at the posterior tibial and triphasic at the dorsalis pedis. 1/17; this is a patient with a very challenging issue was readmitted to the clinic last week. He has a large area over the first right MTP in the setting of a previous right great toe amputation. On presentation this is covered with copious amounts of hyperkeratotic thick callus nonviable tissue. When this is debridement both on this occasion and previous occasions in this clinic he has a very boggy presentation to the subcutaneous tissue which looks somewhat atypical. He has a similar area on the left great toe from the tip of the toe down the medial aspect beyond the interphalangeal joint. He's had previous MRIs of both of his feet. On the right that showed small abscesses and he was taken to the OR by podiatry for debridement and cleaning out of the abscesses. An MRI did not show osteomyelitis of the left first toe. Both the MRIs were done in 2018. He saw Dr. Vallarie Mare at the time who did not think he had significant macrovascular disease. He is not a diabetic but he has idiopathic peripheral neuropathy and was a significant smoker. When he was in the clinic earlier in 2019 he had biopsies done of both areas that showed hyperkeratotic material but no malignancy. X-rays I ordered last week did not show osteomyelitis on the left but did suggest osteomyelitis at the tip of the left great toe. I looked at the x-rays myself this is a subtle finding it is only seen on the lateral film. I did a very significant debridement on him last week bilaterally in both the wound areas look better. When he was previously in our clinic the area on the right did improve with a total contact cast. He dropped out of the schedule at that point I'm not exactly sure why 1/24; the biopsy I did of the area over his right first metatarsal head showed the possibility of verruca vulgaris. Superimposed changes of lichen simplex chronicus. If this is  a plantar  wart this would be extremely large. I think he would need topical destructive therapy and I am going to refer him to dermatology. There was no suggestion of malignancy. Previous biopsies in this clinic on both his areas did not show evidence of malignancy either. Until we get dermatology I am going to continue with silver alginate. X-rays suggested osteomyelitis of the left toe I am going to continue him on trimethoprim sulfamethoxazole 2/3; not much change in this area on the right first met head. Extensive dark eschar formation around the central area of nonviable subcutaneous tissue looking like a boggy subcutaneous tissue. Using a #10 scalpel and pickups I removed all of this. I have tried this before and this man but I plan to put him in a total contact cast next week Using a #5 curette debridement of the left great toe He has an appointment with dermatology at [facility] on the 25th 2/11; right first metatarsal head after last week's extensive debridement is right back to the way it was with a nonviable subcutaneous tissue and thick surrounding eschar. The left great toe looks about the same. The patient could not have a total contact cast today because he had household problems [leaking roof] but I am not going to put a cast on him until I have information from the dermatologist about this. READMISSION 06/17/2020 Mr. Mcclune is now a 64 year old man that we have had in this clinic on at least 2 occasions previously. When he was last here in February 2020 I referred him to dermatology at [facility] for their review of large wounds on the right first metatarsal head with surrounding severe chronic hyperkeratosis. This was biopsied on at least 2 occasions in our clinic and I think at [facility] as well that did not show an underlying malignancy. I have reviewed the consult from Dr. Sharlette Dense dermatology from 01/23/2019. At that time the patient had wounds on the plantar right first metatarsal head as well as the  plantar left great toe. From what he is saying the left great toe healed however he recently had to have a left TMA by Dr. 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 Dermatology at [facility] apparently referred him to Dr. Luetta Nutting of plastic surgery and an operative excision of this area was planned however apparently they ran into the pandemic. The procedure was repetitively cancel and the patient did not keep his final appointment which I think was earlier this year he was also seen in February of this year at the wound care center in [facility] by Dr. Zigmund Daniel. They were applying Silvadene cream and Xeroform which she is essentially doing now. I see in his notes that there was still some concern about underlying malignancy although would be difficult to believe as long as this is been there that there would not be more extensive damage at this point in any case he is back in our clinic for review of this. He had an x-ray of the foot on 01/23/2020 that was negative for osteomyelitis acute fracture. Past medical history; idiopathic peripheral neuropathy, right great toe amputation in 2011, lumbar spinal stenosis, left transmetatarsal amputation on 05/30/2020 by Dr. Sharol Given and more recently apparently a slight wound dehiscence. He also had a fracture of his left tibial plateau sometime in 2020. ABI in our clinic was 0.95 8/9-Patient returns after being established in the clinic last time, he has a new open area on the  plantar aspect of his right second toe, the right foot plantar ulcer below the great toe on the right looks slightly smaller, using PolyMem. Patient attends to his surgeon for the left foot wound 8/23; this is a patient with a refractory wound over his right first metatarsal head in the setting of severe surrounding hyperkeratosis. I had referred him to dermatology subsequently came to the attention of Dr. Vernona Rieger and Dr. Zigmund Daniel at [facility]. He also had a  left TMA by Dr. Sharol Given on the left foot because of osteomyelitis I believe. He is in bilateral surgical shoes. 9/13; refractory wound over the right first metatarsal head with surrounding hyperkeratosis Verrucous skin. He had a left transmetatarsal amputation by Dr. Sharol Given. We have not been following this however this apparently is still not closed. I have looked over his notes from [facility]. This is well summarized by a note from Dr. Zigmund Daniel on February 24. The patient had had several biopsies of the skin of the first toe metatarsal head. As well as the left great toe which has since been amputated. The pathology results showed Stratus corneum with hyperkeratosis and parakeratosis. An underlying verricous carcinoma could not be excluded. I think the patient was given follow-up but I will actually think that he kept his appointments. He was sent back to follow-up with him with them again in February 2021. Dr. Luetta Nutting who is the plastic surgeon recommended wound care rather than extensive debridement. I am very doubtful that this represents any form of malignancy. This patient has been dealing with this wound for years and I think we would have had a declaration of status well before this if this were any form of malignancy. I also do not believe he has an arterial issue. ABI in the clinic here was 0.95. He has easily palpable peripheral pulses. 10/4; generally gradually better wound area over the right plantar foot first metatarsal head.Marland Kitchen He still has thick surrounding callus around the wound margins and a fibrinous debris over the wound surface although in general a lot of this looks considerably better. He has been following with orthopedics for a left amputation by Dr. Sharol Given. We have not been following this 10/18; first metatarsal head wound is still open however there is less adherent callus and thick skin around the edge of this. I think this largely has to do with aggressive debridement last time and I am  going to repeat this today. He is still dealing with a wound on the left transmetatarsal amputation site this is being followed by Dr. Sharol Given. He has a surgical shoe on the left foot we have not been following this. He is using crutches apparently reliably to offload both his feet. 11/1 right first metatarsal head. Smaller surface area considerable amount of callus around this wound. This is a chronic issue. He has had a left transmetatarsal amputation followed by Dr. Sharol Given there is still wounds in this area he is followed with Dr. Sharol Given has a surgical shoe here 11/15; right first metatarsal head I think the wound is about the same substantial surrounding callus. This is a chronic issue. He apparently has been back to the OR with Dr. Sharol Given with regards to his left foot wound/amputation site he comes in today with a new surgical wound and a wound VAC with a full canister. The relevance of this to [name] as I had really wanted to put this man in a total contact cast but I cannot do that as long as he is in a  surgical boot on the left side 11/29; right first metatarsal head about the same. Substantial amount of surrounding callus. This is easily removed however a lot of bleeding of underlying tissue. This is been this way for a long time. Apparently he has had more surgery on the left foot we are not following this this is been followed by his surgeon Dr. Sharol Given. We have not looked at this we have been using polymen over the Electronic Signature(s) Signed: 10/27/2020 4:47:57 PM By: [name] MD Entered By: [name] on 10/27/2020 11:58:59 -------------------------------------------------------------------------------- Physical Exam Details Patient Name: Date of Service: [name], [name] 10/27/2020 10:30 A M Medical Record Number: [id number] Patient Account Number: [id number] Date of Birth/Sex: Treating RN: [birth year] (64 y.o. [name] Primary Care Provider: [name] Other  Clinician: Referring Provider: Treating Provider/Extender: [name] in Treatment: 18 Constitutional Patient is hypertensive.. Pulse regular and within target range for patient.Marland Kitchen Respirations regular, non-labored and within target range.. Temperature is normal and within the target range for the patient.Marland Kitchen Appears in no distress. Notes Wound exam; right first metatarsal head. Previous first toe amputation. Cervical torrential thick nonviable callus and subcutaneous tissue. This is easily removed. Hemostasis with silver nitrate. However the wound does not look any different. No change in dimensions. Electronic Signature(s) Signed: 10/27/2020 4:47:57 PM By: [name] MD Entered By: [name] on 10/27/2020 11:59:55 -------------------------------------------------------------------------------- Physician Orders Details Patient Name: Date of Service: [name], [name] 10/27/2020 10:30 A M Medical Record Number: [id number] Patient Account Number: [id number] Date of Birth/Sex: Treating RN: 10/20/1956 (64 y.o. [name] Primary Care Provider: [name] Other Clinician: Referring Provider: Treating Provider/Extender: [name] in Treatment: 49 Verbal / Phone Orders: No Diagnosis Coding Follow-up Appointments Return Appointment in 2 weeks. Dressing Change Frequency Wound #12 Right Metatarsal head first Change Dressing every other day. Skin Barriers/Peri-Wound Care Moisturizing lotion - to feet daily Wound Cleansing Wound #12 Right Metatarsal head first Clean wound with Wound Cleanser - or normal saline Primary Wound Dressing Wound #12 Right Metatarsal head first Polymem Silver Secondary Dressing Wound #12 Right Metatarsal head first Kerlix/Rolled Gauze - secure with tape Dry Gauze Other: - felt callous pad or foam donut Off-Loading Open toe surgical shoe to: - right foot [place] [facility] for wound care. Alvis Lemmings Electronic Signature(s) Signed: 10/27/2020 4:47:57 PM By: [name] MD Signed: 10/27/2020 5:13:46 PM By: Rhae Hammock RN Entered By: Rhae Hammock on 10/27/2020 11:03:33 -------------------------------------------------------------------------------- Problem List Details Patient Name: Date of Service: [name], [name] 10/27/2020 10:30 A M Medical Record Number: [id number] Patient Account Number: [id number] Date of Birth/Sex: Treating RN: [birth year] (64 y.o. [name] Primary Care Provider: [name] Other Clinician: Referring Provider: Treating Provider/Extender: [name] in Treatment: 18 Active Problems ICD-10 Encounter Code Description Active Date MDM Diagnosis L97.518 Non-pressure chronic ulcer of other part of right foot with other specified 06/17/2020 No Yes severity G90.09 Other idiopathic peripheral autonomic neuropathy 06/17/2020 No Yes Inactive Problems Resolved Problems Electronic Signature(s) Signed: 10/27/2020 4:47:57 PM By: [name] MD Entered By: [name] on 10/27/2020 11:57:05 -------------------------------------------------------------------------------- Progress Note Details Patient Name: Date of Service: [name], [name] 10/27/2020 10:30 A M Medical Record Number: [id number] Patient Account Number: [id number] Date of Birth/Sex: Treating RN: 28-Dec-1955 (64 y.o. [name] Primary Care Provider: [name] Other Clinician: Referring Provider: Treating Provider/Extender: [name]  [place], Scott Weeks in Treatment: 18 Subjective History of Present Illness (HPI) The following HPI elements were documented for the patient's wound: Location: right first metatarsal head on the plantar aspect and the left big toe and the right fifth toe Quality: Patient reports No Pain. Severity: Patient states wound(s) are getting  worse. Duration: Patient has had the wound for > 24 months prior to seeking treatment at the wound center Context: The wound would happen gradually Modifying Factors: Wound improving due to current treatment. he has been under podiatry care for over a year and a half Associated Signs and Symptoms: Patient reports having: heaviness in both legs This 64 year old gentleman who has had a long-standing neuropathy of both feet without history of diabetes mellitus has never been worked up for his neurological problem in the last 5 years or so. Most recently he has been seen by the podiatrist Dr. [name] who was been seeing them for about a year and a half and from what I understand has been treating him for bilateral ulceration on the feet, the right being in the region of the first metatarsal head plantar aspect and the left being in the area of the left big toe. He's had various antibiotics including Levaquin recently and this was then switched to Cipro and clindamycin. Due to the nature of this wound which has been there for a long while he has been recently referred to [name] for an opinion. An MRI was done in the middle of June which showed several small abscesses near the amputation site of the right great toe with draining and open wounds with surrounding cellulitis but no evidence of septic arthritis, osteomyelitis or pyomyositis. I understand at this stage he was taken to the operating room by [name] who debrided the wound and cleaned out the abscess. I understand ABIs were done bilaterally and they were within normal limits at Dr. [name] office. The patient is a smoker and is working on quitting smoking. 07/13/2017 -- had a lower extremity arterial duplex evaluation which showed patent bilateral lower extremity arteries without evidence of hemodynamically significant stenosis. His right ABI was 1.05 and the left was 0.95 He had a lower extremity venous duplex reflux evaluation done which  showed significant venous incompetence in the bilateral common femoral, saphenofemoral junction, small saphenous vein and in the right great saphenous vein. With these findings I believe he will benefit from a consultation with the vascular surgeon regarding the endovenous ablation procedure. the patient's neurology appointment and dermatology appointment is still pending. He has had a x-ray done by his PCP and the results are not available. However he did see [name] who I understand has ordered a bilateral MRI of his feet. 07/26/2017 -- was seen by [name] on 07/20/2017. His impression was that he presented with minimal bilateral lower extremity peripheral arterial disease, bilateral lower extremity chronic venous insufficiency (C4) and radiculopathy versus neuropathy bilateral lower extremity. No arterial intervention was needed at the present time. He recommended maximal medical management at this stage for his atherosclerotic disease . He recommended compression to be continued and no venous intervention at the present time. He was offered follow-up in the Vein clinic in 3 months time. MR of the right foot done with and without contrast -- IMPRESSION:Wound on the plantar surface of the foot at approximately the level of first MTP joint with a tiny underlying soft tissue abscess. Large area of soft tissue edema subjacent to the wound is most compatible with  granulation tissue. Negative for osteomyelitis about the first MTP joint. Mild edema and enhancement in the proximal phalanx of the little toe is stable compared to the prior examination and may be due to stress change. Infection is possible but thought highly unlikely. Subcutaneous edema over the dorsum of the foot compatible with dependent change/cellulitis. the patient also has had a abdominal CT which shows a kidney mass and is going to have her MRI for this. He also has a urology opinion pending and a general surgeon's opinion  regarding a colonic mass. 08/02/2017 -- his appointments with his other urology and surgical consultants is still later. He has an MRI of the left foot later this week. 08/09/2017 --MR of the left foot with and without contrast -- IMPRESSION:Large appearing skin wound on the medial aspect of the great toe without underlying abscess or osteomyelitis.First MTP osteoarthritis. Marked marrow edema in the medial sesamoid bone is likely related to osteoarthritis but could be due to sesamoiditis. 08/16/2017 -- the patient has had a surgical opinion and a urological opinion and a extensive surgery including possible colon resection, bladder surgery and kidney surgery is being planned. No date has been set for his procedure He continues to be off smoking 08/31/2017 -- he was seen by the neurologist for his multiple problems of neuropathic and radicular symptoms in both legs with a thorough workup done. MRIs were reviewed lab work was reviewed and the assessment was that he had severe neuropathy due to multifactorial reasons and a neuropathy panel of lab work was ordered and he would be reviewed back. Patient is also due for colonoscopy to be repeated before his bladder and kidney surgery. At the present time he would like [name] to continue with conservative and symptomatic wound care for both his feet. 10/05/2017 -- since I have seen him last time he has had a colonoscopy which was fairly normal except for a polyp and he has a urology appointment pending later today. He still continues to stay off smoking. 10/26/2017-- he returns after about 3 weeks and in the meanwhile he was seen by Dr. Kellie Simmering, who reviewed his venous studies and noted that there was very minimal enlargement of the bilateral great saphenous vein on the right and somewhat larger vein on the left but no consistent reflux throughout the great saphenous veins and there was also some right small saphenous veins which were enlarged with some  reflux. However overall he thought that venous insufficiency was not significantly the cause of his ulceration and it was more of trophic ulcers and hence he recommended aggressive wound care, and at some stage he may need amputation of the left first toe and the right fifth toe. 11/09/2017 -- due to the inclement weather he has missed his urology appointment and is still awaiting the rescheduled one. Other than that he's been doing fairly well. 11/23/2017 -- he has had his cystoscopy done by his urologist and they found multiple areas of concern and he is going to be set up for a procedure sometime later in January. He is on complex care as far as his wounds go and seizures every 2-3 weeks 12/07/17 patient presents today for evaluation concerning his right great toe amputation site as well as his left great toe ulcer. He has been tolerating the dressing changes fairly well. With that being said there does not appear to be any significant discomfort at this point that he is experiencing although he has quite significant callous especially in regard to the  amputation site of the right great toe. Nonetheless he has no evidence of infection which is good news No fevers, chills, nausea, or vomiting noted at this time. He has no pain. READMISSION 05/09/18; is a patient who hasn't been here in quite some time. He is not a diabetic but he has severe idiopathic peripheral neuropathy. He was here for review of a nonhealing wound on the right great toe amputation site and a large portion of the tip of the left great toe. Sometime after his last visit here he became ill he had abdominal issues infections and required hospitalizations. He largely dropped off the map. He is putting silver alginate on the wounds. I note that he had arterial studies and saw Dr. Bridgett Larsson of vascular surgery was not felt to have an arterial issue. He also had reflux studies and he wears compression stockings and he is faithful with these.  He had MRI of both of his feet that not show osteomyelitis question small abscess on the right. He tells me that the nonhealing wound on his right great toe amputation site is been there since the actual amputation which was in 2014 I note the area was aggressively debrided by Dr. Jacqualyn Posey of podiatry before he started coming to this clinic apparently there were abscess is present at that time. His last formal arterial studies were done in August 2018 which time his ABI was 1.05 on the right and 0.95 on the left he had biphasic and triphasic waveforms on the left and monophasic and biphasic waveforms on the right. He did not have TBI's. ABIs in our clinic today were 0.8 bilaterally. The patient states he is not a diabetic. He does have idiopathic peripheral neuropathy. He is a smoker but is trying to quit with Chantix. 05/16/18; x-rays of his bilateral feet were negative for osteomyelitis. He was noted to have a chronic deformity of the head of the second metatarsal which may be due to previous osteonecrosis there was no definite underlying bone destruction to suggest osteomyelitis on either side. He came in with the extensive callus over his right great toe amputation site and the left great toe did extensive debridement last week and another extensive debridement today.patient is changing his dressing himself with silver alginate 05/30/18; still buildup of thick callus covering thick subcutaneous tissue around both of these areas. This requires an extensive debridement on both sides. I have discussed offloading this area with the patient. I think he inverts when he walks. His foot sizes beyond what we can put in a Darco forefoot off loader or probably what we can put in a total contact cast. 06/13/18; still a remarkable buildup of callus over the right first toe amputation site. The left first toe has 2 open wounds one medially and 1 at the tip of the toe. Surrounding callus is also not back here. We've  been using silver alginate. He has a darco forefoot off loader now on the right foot.his own shoe on the left foot. Would like to try a total contact cast on the right. He is making arrangements for someone to drive him here in 2 weeks. 06/29/18; still a buildup of callus over the right first toe amputation site although not as significant as previously. The left first toe has 2 open wounds one medially and one at the tip surrounding callus as well. We've been using silver alginate. He comes in today with the right for a total contact cast which I'll use on the right 07/04/18  on evaluation today patient appears to be doing decently well in regard to the cast. Unfortunately he did have a little area that rubbed on the lateral aspect of his leg the cast actually cracked on the medial aspect this happened he states just yesterday. He has not been walking in the cast without the boot portion on. With that being said he states that he is unsure of exactly what happened with the crack. Nonetheless this does seem to have caused a little bit of rubbing on the lateral aspect I think due to the integrity of the cast being compromised. Nonetheless I advised him that if this happens in the future he needs to let us know soon as possible. Nonetheless I do feel like the cast has been of benefit some of the area on the distal portion of his great toe amputation site actually appears to be doing better. 07/12/18 on evaluation today patient appears to be doing very well in regard to his right great toe invitation site. This seems to be showing signs of excellent improvement which is great news. There does not appear to be any evidence of infection at this time. With that being said he has been tolerating the dressing changes without complication. There appears to be no evidence of infection and again this is barely open at this point. The total contact cast has been of excellent benefit for him. I believe this will likely  be completely closed and ready next week that we can switch to a cast on the left foot. He states he may want to take a week's break. 07/19/18 on evaluation today patient appears to be doing better in regard to both ulceration areas. The right foot actually appears to be completely healed he still has some callous but no open wound. The left foot/great toe actually seems to be better after last week's debridement overall the appearance of the wound is greatly improved. 08/09/18 on evaluation today patient actually appears to be doing a little bit worse in regard to the right metatarsal invitation site. He's been tolerating the dressing changes without complication. With that being said he does note that the area actually cracked open as of today when he was in the shower trying to scrub it. Nonetheless it does appear that the Caryn Section that previously was open and draining has subsequently reopened and is draining again. Nonetheless as I was looking at him and performing the debridement today I did inquire about whether or not we had ever biopsy the area as I was concerned about the possibility for something along the lines of a work type virus/infection causing the excessive callous buildup. Especially since he seems to grow much more than what would just be traditionally expected by friction alone. He subsequently told me that he did have this biopsied somewhere around 1-2 years ago by Dr. Jacqualyn Posey and that it showed that he had a wart infection at that point. Nonetheless he was never referred to dermatology better Jacqualyn Posey attempted to cut it out according to the patient. Nonetheless that may be part of the big issue with what we're dealing with here and without treating the wart infection we may not actually get this to heal and stay closed. READMISSION 12/07/2018 The patient is back in clinic today essentially with wounds in the same area and condition is that I think the last 2 times he has been here.  This includes the right first metatarsal head in the setting of a previous right great toe amputation  and a large part of the medial part of his left great toe. The patient states he has been dealing with this for years dating back to 2005 on and off. That he is never found this to be totally healed. When he was last in this clinic he had this biopsied shave biopsies which showed hyperkeratotic and parakeratotic debris. Epithelium with papillary features negative for malignancy. He has been using silver alginate. He has healing sandals which he uses Felt on the bottom of these. When he was here last time we put him a total contact cast at least for a while and the patient states on the right that helped with the buildup of hyperkeratotic tissue and that seems to be reflective in our notes. He also states that was previously biopsied by Dr. Jacqualyn Posey of podiatry. The patient has a severe idiopathic peripheral neuropathy which he attributes to a brown recluse spider bite in 2002 or so. He is not a diabetic The patient had arterial studies in this showed an ABI in the right of 1.05 on the left was 0.95. TBI's were not done. He had monophasic waveforms at the right posterior tibial biphasic at the dorsalis pedis. On the left he was biphasic at the posterior tibial and triphasic at the dorsalis pedis. 1/17; this is a patient with a very challenging issue was readmitted to the clinic last week. He has a large area over the first right MTP in the setting of a previous right great toe amputation. On presentation this is covered with copious amounts of hyperkeratotic thick callus nonviable tissue. When this is debridement both on this occasion and previous occasions in this clinic he has a very boggy presentation to the subcutaneous tissue which looks somewhat atypical. He has a similar area on the left great toe from the tip of the toe down the medial aspect beyond the interphalangeal joint. He's had previous  MRIs of both of his feet. On the right that showed small abscesses and he was taken to the OR by podiatry for debridement and cleaning out of the abscesses. An MRI did not show osteomyelitis of the left first toe. Both the MRIs were done in 2018. He saw Dr. Vallarie Mare at the time who did not think he had significant macrovascular disease. He is not a diabetic but he has idiopathic peripheral neuropathy and was a significant smoker. When he was in the clinic earlier in 2019 he had biopsies done of both areas that showed hyperkeratotic material but no malignancy. X-rays I ordered last week did not show osteomyelitis on the left but did suggest osteomyelitis at the tip of the left great toe. I looked at the x-rays myself this is a subtle finding it is only seen on the lateral film. I did a very significant debridement on him last week bilaterally in both the wound areas look better. When he was previously in our clinic the area on the right did improve with a total contact cast. He dropped out of the schedule at that point I'm not exactly sure why 1/24; the biopsy I did of the area over his right first metatarsal head showed the possibility of verruca vulgaris. Superimposed changes of lichen simplex chronicus. If this is a plantar wart this would be extremely large. I think he would need topical destructive therapy and I am going to refer him to dermatology. There was no suggestion of malignancy. Previous biopsies in this clinic on both his areas did not show evidence of malignancy either.  Until we get dermatology I am going to continue with silver alginate. X-rays suggested osteomyelitis of the left toe I am going to continue him on trimethoprim sulfamethoxazole 2/3; not much change in this area on the right first met head. Extensive dark eschar formation around the central area of nonviable subcutaneous tissue looking like a boggy subcutaneous tissue. Using a #10 scalpel and pickups I removed all of this. I  have tried this before and this man but I plan to put him in a total contact cast next week ooUsing a #5 curette debridement of the left great toe ooHe has an appointment with dermatology at [facility] on the 25th 2/11; right first metatarsal head after last week's extensive debridement is right back to the way it was with a nonviable subcutaneous tissue and thick surrounding eschar. The left great toe looks about the same. The patient could not have a total contact cast today because he had household problems [leaking roof] but I am not going to put a cast on him until I have information from the dermatologist about this. READMISSION 06/17/2020 Mr. Duddy is now a 64 year old man that we have had in this clinic on at least 2 occasions previously. When he was last here in February 2020 I referred him to dermatology at [facility] for their review of large wounds on the right first metatarsal head with surrounding severe chronic hyperkeratosis. This was biopsied on at least 2 occasions in our clinic and I think at [facility] as well that did not show an underlying malignancy. I have reviewed the consult from Dr. Sharlette Dense dermatology from 01/23/2019. At that time the patient had wounds on the plantar right first metatarsal head as well as the plantar left great toe. From what he is saying the left great toe healed however he recently had to have a left TMA by Dr. 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 Dermatology at [facility] apparently referred him to Dr. Luetta Nutting of plastic surgery and an operative excision of this area was planned however apparently they ran into the pandemic. The procedure was repetitively cancel and the patient did not keep his final appointment which I think was earlier this year he was also seen in February of this year at the wound care center in [facility] by Dr. Zigmund Daniel. They were applying Silvadene cream and Xeroform which she is essentially  doing now. I see in his notes that there was still some concern about underlying malignancy although would be difficult to believe as long as this is been there that there would not be more extensive damage at this point in any case he is back in our clinic for review of this. He had an x-ray of the foot on 01/23/2020 that was negative for osteomyelitis acute fracture. Past medical history; idiopathic peripheral neuropathy, right great toe amputation in 2011, lumbar spinal stenosis, left transmetatarsal amputation on 05/30/2020 by Dr. Sharol Given and more recently apparently a slight wound dehiscence. He also had a fracture of his left tibial plateau sometime in 2020. ABI in our clinic was 0.95 8/9-Patient returns after being established in the clinic last time, he has a new open area on the plantar aspect of his right second toe, the right foot plantar ulcer below the great toe on the right looks slightly smaller, using PolyMem. Patient attends to his surgeon for the left foot wound 8/23; this is a patient with a refractory wound over his right first  metatarsal head in the setting of severe surrounding hyperkeratosis. I had referred him to dermatology subsequently came to the attention of Dr. Vernona Rieger and Dr. Zigmund Daniel at [facility]. He also had a left TMA by Dr. Sharol Given on the left foot because of osteomyelitis I believe. He is in bilateral surgical shoes. 9/13; refractory wound over the right first metatarsal head with surrounding hyperkeratosis Verrucous skin. He had a left transmetatarsal amputation by Dr. Sharol Given. We have not been following this however this apparently is still not closed. I have looked over his notes from [address] [facility]. This is well summarized by a note from Dr. Zigmund Daniel on February 24. The patient had had several biopsies of the skin of the first toe metatarsal head. As well as the left great toe which has since been amputated. The pathology results showed Stratus corneum with hyperkeratosis and parakeratosis.  An underlying verricous carcinoma could not be excluded. I think the patient was given follow-up but I will actually think that he kept his appointments. He was sent back to follow-up with him with them again in February 2021. Dr. Luetta Nutting who is the plastic surgeon recommended wound care rather than extensive debridement. I am very doubtful that this represents any form of malignancy. This patient has been dealing with this wound for years and I think we would have had a declaration of status well before this if this were any form of malignancy. I also do not believe he has an arterial issue. ABI in the clinic here was 0.95. He has easily palpable peripheral pulses. 10/4; generally gradually better wound area over the right plantar foot first metatarsal head.Marland Kitchen He still has thick surrounding callus around the wound margins and a fibrinous debris over the wound surface although in general a lot of this looks considerably better. He has been following with orthopedics for a left amputation by Dr. Sharol Given. We have not been following this 10/18; first metatarsal head wound is still open however there is less adherent callus and thick skin around the edge of this. I think this largely has to do with aggressive debridement last time and I am going to repeat this today. He is still dealing with a wound on the left transmetatarsal amputation site this is being followed by Dr. Sharol Given. He has a surgical shoe on the left foot we have not been following this. He is using crutches apparently reliably to offload both his feet. 11/1 right first metatarsal head. Smaller surface area considerable amount of callus around this wound. This is a chronic issue. He has had a left transmetatarsal amputation followed by Dr. Sharol Given there is still wounds in this area he is followed with Dr. Sharol Given has a surgical shoe here 11/15; right first metatarsal head I think the wound is about the same substantial surrounding callus. This is a chronic  issue. He apparently has been back to the OR with Dr. Sharol Given with regards to his left foot wound/amputation site he comes in today with a new surgical wound and a wound VAC with a full canister. The relevance of this to [name] as I had really wanted to put this man in a total contact cast but I cannot do that as long as he is in a surgical boot on the left side 11/29; right first metatarsal head about the same. Substantial amount of surrounding callus. This is easily removed however a lot of bleeding of underlying tissue. This is been this way for a long time. Apparently he has had more surgery on  the left foot we are not following this this is been followed by his surgeon Dr. Sharol Given. We have not looked at this we have been using polymen over the Objective Constitutional Patient is hypertensive.. Pulse regular and within target range for patient.Marland Kitchen Respirations regular, non-labored and within target range.. Temperature is normal and within the target range for the patient.Marland Kitchen Appears in no distress. Vitals Time Taken: 10:44 AM, Height: 80 in, Weight: 350 lbs, BMI: 38.4, Temperature: 97.8 F, Pulse: 91 bpm, Respiratory Rate: 18 breaths/min, Blood Pressure: 152/81 mmHg. General Notes: Wound exam; right first metatarsal head. Previous first toe amputation. Cervical torrential thick nonviable callus and subcutaneous tissue. This is easily removed. Hemostasis with silver nitrate. However the wound does not look any different. No change in dimensions. Integumentary (Hair, Skin) Wound #12 status is Open. Original cause of wound was Gradually Appeared. The wound is located on the Right Metatarsal head first. The wound measures 3.4cm length x 1.1cm width x 0.7cm depth; 2.937cm^2 area and 2.056cm^3 volume. Assessment Active Problems ICD-10 Non-pressure chronic ulcer of other part of right foot with other specified severity Other idiopathic peripheral autonomic neuropathy Plan Follow-up Appointments: Return  Appointment in 2 weeks. Dressing Change Frequency: Wound #12 Right Metatarsal head first: Change Dressing every other day. Skin Barriers/Peri-Wound Care: Moisturizing lotion - to feet daily Wound Cleansing: Wound #12 Right Metatarsal head first: Clean wound with Wound Cleanser - or normal saline Primary Wound Dressing: Wound #12 Right Metatarsal head first: Polymem Silver Secondary Dressing: Wound #12 Right Metatarsal head first: Kerlix/Rolled Gauze - secure with tape Dry Gauze Other: - felt callous pad or foam donut Off-Loading: Open toe surgical shoe to: - right foot Home Health: [facility] for wound care. - [place] 1. No real improvement in this wound 2. Continue polymen 3. I am reluctant to put the right leg in a total contact cast because of the force this would put on his surgical foot on the left and I discussed this with him 4. I do not really think that aggressively debriding this wound is really helped. He comes in with it looking the same. He has been to see dermatology, plastic surgery many times over the last several years none of this is really led to a more aggressive and better yielding result. 5. I will see him again in 2 weeks. Other than that I will put him out a month. I cannot cast this and put the force that that would result on the left foot which I gather has just undergone a surgical revision Electronic Signature(s) Signed: 10/27/2020 4:47:57 PM By: [name] MD Entered By: [name] on 10/27/2020 12:01:18 -------------------------------------------------------------------------------- SuperBill Details Patient Name: Date of Service: [name], [name] 10/27/2020 Medical Record Number: [id number] Patient Account Number: [id number] Date of Birth/Sex: Treating RN: [birth year] (64 y.o. [name] Primary Care Provider: [name] Other Clinician: Referring Provider: Treating Provider/Extender: [name] in Treatment: 18 Diagnosis Coding ICD-10 Codes Code Description L97.518 Non-pressure chronic ulcer of other part of right foot with other specified severity G90.09 Other idiopathic peripheral autonomic neuropathy Physician Procedures : CPT4 Code Description Modifier [id number] - WC PHYS LEVEL 3 - EST PT ICD-10 Diagnosis Description L97.518 Non-pressure chronic ulcer of other part of right foot with other specified severity G90.09 Other idiopathic peripheral autonomic neuropathy Quantity: 1 Electronic Signature(s) Signed: 10/27/2020 4:47:57 PM By: [name] MD Entered By: [name] on 10/27/2020 12:01:35

## 2020-10-28 DIAGNOSIS — M86672 Other chronic osteomyelitis, left ankle and foot: Secondary | ICD-10-CM | POA: Diagnosis not present

## 2020-10-28 DIAGNOSIS — J449 Chronic obstructive pulmonary disease, unspecified: Secondary | ICD-10-CM | POA: Diagnosis not present

## 2020-10-28 DIAGNOSIS — I129 Hypertensive chronic kidney disease with stage 1 through stage 4 chronic kidney disease, or unspecified chronic kidney disease: Secondary | ICD-10-CM | POA: Diagnosis not present

## 2020-10-28 DIAGNOSIS — L97518 Non-pressure chronic ulcer of other part of right foot with other specified severity: Secondary | ICD-10-CM | POA: Diagnosis not present

## 2020-10-28 DIAGNOSIS — N189 Chronic kidney disease, unspecified: Secondary | ICD-10-CM | POA: Diagnosis not present

## 2020-10-28 DIAGNOSIS — T8744 Infection of amputation stump, left lower extremity: Secondary | ICD-10-CM | POA: Diagnosis not present

## 2020-10-28 DIAGNOSIS — K5792 Diverticulitis of intestine, part unspecified, without perforation or abscess without bleeding: Secondary | ICD-10-CM | POA: Diagnosis not present

## 2020-10-28 DIAGNOSIS — G9009 Other idiopathic peripheral autonomic neuropathy: Secondary | ICD-10-CM | POA: Diagnosis not present

## 2020-10-28 DIAGNOSIS — T8781 Dehiscence of amputation stump: Secondary | ICD-10-CM | POA: Diagnosis not present

## 2020-10-29 ENCOUNTER — Telehealth: Payer: Self-pay

## 2020-10-29 NOTE — Telephone Encounter (Signed)
Verdene Lennert, RN case manager with [employer] would like a call back concerning durable medical equipment for patient (shower chair).  Cb# [id number].  Please advise.  Thank you.

## 2020-10-29 NOTE — Progress Notes (Signed)
[name], [name] ([id number]) Visit Report for 10/27/2020 Arrival Information Details Patient Name: Date of Service: Cory DA MS, [name] 10/27/2020 10:30 Cory M Medical Record Number: [id number] Patient Account Number: [id number] Date of Birth/Sex: Treating RN: 12/25/1955 (64 y.o. [name] Primary Care [name]: [name] Other Clinician: Referring [name]: Treating [name]/Extender: [name] in Treatment: 18 Visit Information History Since Last Visit Added or deleted any medications: No Patient Arrived: Crutches Any new allergies or adverse reactions: No Arrival Time: 10:44 Had Cory fall or experienced change in No Accompanied By: self activities of daily living that may affect Transfer Assistance: None risk of falls: Patient Identification Verified: Yes Signs or symptoms of abuse/neglect since last visito No Secondary Verification Process Completed: Yes Hospitalized since last visit: No Patient Requires Transmission-Based Precautions: No Implantable device outside of the clinic excluding No Patient Has Alerts: No cellular tissue based products placed in the center since last visit: Has Dressing in Place as Prescribed: Yes Pain Present Now: No Electronic Signature(s) Signed: 10/29/2020 9:59:16 AM By: [name] Entered By: [name] on 10/27/2020 10:44:33 -------------------------------------------------------------------------------- Encounter Discharge Information Details Patient Name: Date of Service: Cory DA MS, [name] 10/27/2020 10:30 Cory M Medical Record Number: [id number] Patient Account Number: [id number] Date of Birth/Sex: Treating RN: [birth year] (64 y.o. [name] Primary Care Aneesh Faller: [name] Other Clinician: Referring Irania Durell: Treating Urias [name]/Extender: [name] in Treatment: 18 Encounter Discharge Information Items Post Procedure Vitals Discharge Condition:  Stable Temperature (F): 97.8 Ambulatory Status: Crutches Pulse (bpm): 91 Discharge Destination: Home Respiratory Rate (breaths/min): 18 Transportation: Private Auto Blood Pressure (mmHg): 152/81 Accompanied By: self Schedule Follow-up Appointment: Yes Clinical Summary of Care: Electronic Signature(s) Signed: 10/27/2020 5:09:36 PM By: [name] Entered By: [name] on 10/27/2020 11:35:44 -------------------------------------------------------------------------------- Multi Wound Chart Details Patient Name: Date of Service: Cory DA MS, [name] 10/27/2020 10:30 Cory M Medical Record Number: [id number] Patient Account Number: [id number] Date of Birth/Sex: Treating RN: 1956-06-10 (64 y.o. [name] Primary Care [name]: [name] Other Clinician: Referring Zanobia Griebel: Treating [name]/Extender: [name] in Treatment: 18 Vital Signs Height(in): 80 Pulse(bpm): 19 Weight(lbs): 350 Blood Pressure(mmHg): 152/81 Body Mass Index(BMI): 38 Temperature(F): 97.8 Respiratory Rate(breaths/min): 18 Photos: [12:No Photos Right Metatarsal head first] [N/Cory:N/Cory N/Cory] Wound Location: [12:Gradually Appeared] [N/Cory:N/Cory] Wounding Event: [12:Neuropathic Ulcer-Non Diabetic] [N/Cory:N/Cory] Primary Etiology: [12:11/29/2018] [N/Cory:N/Cory] Date Acquired: [12:18] [N/Cory:N/Cory] Weeks of Treatment: [12:Open] [N/Cory:N/Cory] Wound Status: [12:3.4x1.1x0.7] [N/Cory:N/Cory] Measurements L x W x D (cm) [12:2.937] [N/Cory:N/Cory] Cory (cm) : rea [12:2.056] [N/Cory:N/Cory] Volume (cm) : [12:68.50%] [N/Cory:N/Cory] % Reduction in Cory rea: [12:-10.20%] [N/Cory:N/Cory] % Reduction in Volume: [12:Full Thickness Without Exposed] [N/Cory:N/Cory] Classification: [12:Support Structures Debridement - Excisional] [N/Cory:N/Cory] Debridement: Pre-procedure Verification/Time Out 11:00 [N/Cory:N/Cory] Taken: [12:Lidocaine 4% T opical Solution] [N/Cory:N/Cory] Pain Control: [12:Callus, Subcutaneous] [N/Cory:N/Cory] Tissue Debrided: [12:Skin/Subcutaneous  Tissue] [N/Cory:N/Cory] Level: [12:9] [N/Cory:N/Cory] Debridement Cory (sq cm): [12:rea Blade, Forceps] [N/Cory:N/Cory] Instrument: [12:Moderate] [N/Cory:N/Cory] Bleeding: [12:Silver Nitrate] [N/Cory:N/Cory] Hemostasis Cory chieved: [12:0] [N/Cory:N/Cory] Procedural Pain: [12:0] [N/Cory:N/Cory] Post Procedural Pain: [12:Procedure was tolerated well] [N/Cory:N/Cory] Debridement Treatment Response: [12:3.4x1.1x0.7] [N/Cory:N/Cory] Post Debridement Measurements L x W x D (cm) [12:2.056] [N/Cory:N/Cory] Post Debridement Volume: (cm) [12:Chemical Cauterization] [N/Cory:N/Cory] Procedures Performed: [12:Debridement] Treatment Notes Wound #12 (Right Metatarsal head first) 1. Cleanse With Wound Cleanser 2. Periwound Care Skin Prep 3. Primary Dressing Applied Polymem Ag 4. Secondary Dressing Dry Gauze Roll Gauze 5. Secured With Medipore tape Notes felt dressing medipore tape used to secure down along with  medipore taped gauze over the felt and primary dressing to hold all in place. Electronic Signature(s) Signed: 10/27/2020 4:47:57 PM By: Cory Ham MD Signed: 10/28/2020 6:12:47 PM By: Cory Hurst RN, BSN Entered By: [name] on 10/27/2020 11:57:12 -------------------------------------------------------------------------------- Multi-Disciplinary Care Plan Details Patient Name: Date of Service: Cory DA MS, [name] 10/27/2020 10:30 Cory M Medical Record Number: [id number] Patient Account Number: [id number] Date of Birth/Sex: Treating RN: [birth year] (64 y.o. [name] Primary Care [name]: [name] Other Clinician: Referring [name]: Treating Sebastion Jun/Extender: [name] in Treatment: 18 Active Inactive Wound/Skin Impairment Nursing Diagnoses: Impaired tissue integrity Knowledge deficit related to ulceration/compromised skin integrity Goals: Patient/caregiver will verbalize understanding of skin care regimen Date Initiated: 06/17/2020 Target Resolution Date: 11/28/2020 Goal Status:  Active Ulcer/skin breakdown will have Cory volume reduction of 30% by week 4 Date Initiated: 06/17/2020 Date Inactivated: 07/21/2020 Target Resolution Date: 07/18/2020 Goal Status: Met Interventions: Assess patient/caregiver ability to obtain necessary supplies Assess patient/caregiver ability to perform ulcer/skin care regimen upon admission and as needed Assess ulceration(s) every visit Provide education on ulcer and skin care Notes: Electronic Signature(s) Signed: 10/27/2020 5:13:46 PM By: Rhae Hammock RN Entered By: Rhae Hammock on 10/27/2020 11:04:01 -------------------------------------------------------------------------------- Pain Assessment Details Patient Name: Date of Service: Cory DA MS, [name] 10/27/2020 10:30 Cory M Medical Record Number: [id number] Patient Account Number: [id number] Date of Birth/Sex: Treating RN: 06-19-1956 (64 y.o. [name] Primary Care Shann Lewellyn: [name] Other Clinician: Referring Roya Gieselman: Treating Juel Ripley/Extender: [name] in Treatment: 18 Active Problems Location of Pain Severity and Description of Pain Patient Has Paino No Site Locations Pain Management and Medication Current Pain Management: Electronic Signature(s) Signed: 10/28/2020 6:12:47 PM By: Cory Hurst RN, BSN Signed: 10/29/2020 9:59:16 AM By: [name] Entered By: [name] on 10/27/2020 10:45:00 -------------------------------------------------------------------------------- Patient/Caregiver Education Details Patient Name: Date of Service: Cory DA MS, [name] 11/29/2021andnbsp10:30 Cory M Medical Record Number: [id number] Patient Account Number: [id number] Date of Birth/Gender: Treating RN: 11-19-1956 (64 y.o. [name] Primary Care Physician: [name] Other Clinician: Referring Physician: Treating Physician/Extender: [name] in Treatment: 18 Education  Assessment Education Provided To: Patient Education Topics Provided Basic Hygiene: Methods: Explain/Verbal Responses: Reinforcements needed Wound/Skin Impairment: Methods: Explain/Verbal Responses: Reinforcements needed Electronic Signature(s) Signed: 10/27/2020 5:13:46 PM By: Rhae Hammock RN Entered By: Rhae Hammock on 10/27/2020 11:04:31 -------------------------------------------------------------------------------- Wound Assessment Details Patient Name: Date of Service: Cory DA MS, [name] 10/27/2020 10:30 Cory M Medical Record Number: [id number] Patient Account Number: [id number] Date of Birth/Sex: Treating RN: [birth year] (64 y.o. [name] Primary Care Necie Wilcoxson: [name] Other Clinician: Referring Adriahna Shearman: Treating Magdelyn Roebuck/Extender: [name] in Treatment: 18 Wound Status Wound Number: 12 Primary Etiology: Neuropathic Ulcer-Non Diabetic Wound Location: Right Metatarsal head first Wound Status: Open Wounding Event: Gradually Appeared Date Acquired: 11/29/2018 Weeks Of Treatment: 18 Clustered Wound: No Photos Photo Uploaded By: Mikeal Hawthorne on 10/28/2020 14:42:41 Wound Measurements Length: (cm) 3.4 Width: (cm) 1.1 Depth: (cm) 0.7 Area: (cm) 2.937 Volume: (cm) 2.056 % Reduction in Area: 68.5% % Reduction in Volume: -10.2% Wound Description Classification: Full Thickness Without Exposed Support Structur es Treatment Notes Wound #12 (Right Metatarsal head first) 1. Cleanse With Wound Cleanser 2. Periwound Care Skin Prep 3. Primary Dressing Applied Polymem Ag 4. Secondary Dressing Dry Gauze Roll Gauze 5. Secured With Medipore tape Notes felt dressing medipore tape used to secure down along with medipore taped gauze over the felt  and primary dressing to hold all in place. Electronic Signature(s) Signed: 10/28/2020 6:12:47 PM By: Cory Hurst RN, BSN Signed: 10/29/2020 9:59:16 AM By: [name] Entered By: [name] on 10/27/2020 10:49:35 -------------------------------------------------------------------------------- Vitals Details Patient Name: Date of Service: Cory DA MS, [name] 10/27/2020 10:30 Cory M Medical Record Number: [id number] Patient Account Number: [id number] Date of Birth/Sex: Treating RN: 11-Aug-1956 (64 y.o. [name] Primary Care Marvell Stavola: [name] Other Clinician: Referring Shanell Aden: Treating Hether Anselmo/Extender: [name] in Treatment: 18 Vital Signs Time Taken: 10:44 Temperature (F): 97.8 Height (in): 80 Pulse (bpm): 91 Weight (lbs): 350 Respiratory Rate (breaths/min): 18 Body Mass Index (BMI): 38.4 Blood Pressure (mmHg): 152/81 Reference Range: 80 - 120 mg / dl Electronic Signature(s) Signed: 10/29/2020 9:59:16 AM By: [name] Entered By: [name] on 10/27/2020 10:44:51

## 2020-10-29 NOTE — Telephone Encounter (Signed)
I called and lm on vm to advise that the order was placed on 10/21/20 and that insurance does not cover this item. The pt was offered to privately purchase the item and he declined so the referral for a shower bench was cancelled. To call with any other questions.

## 2020-10-31 ENCOUNTER — Ambulatory Visit (INDEPENDENT_AMBULATORY_CARE_PROVIDER_SITE_OTHER): Payer: Medicare PPO | Admitting: Family

## 2020-10-31 ENCOUNTER — Other Ambulatory Visit: Payer: Self-pay

## 2020-10-31 ENCOUNTER — Encounter: Payer: Self-pay | Admitting: Family

## 2020-10-31 DIAGNOSIS — K5792 Diverticulitis of intestine, part unspecified, without perforation or abscess without bleeding: Secondary | ICD-10-CM | POA: Diagnosis not present

## 2020-10-31 DIAGNOSIS — I129 Hypertensive chronic kidney disease with stage 1 through stage 4 chronic kidney disease, or unspecified chronic kidney disease: Secondary | ICD-10-CM | POA: Diagnosis not present

## 2020-10-31 DIAGNOSIS — T8744 Infection of amputation stump, left lower extremity: Secondary | ICD-10-CM | POA: Diagnosis not present

## 2020-10-31 DIAGNOSIS — N189 Chronic kidney disease, unspecified: Secondary | ICD-10-CM | POA: Diagnosis not present

## 2020-10-31 DIAGNOSIS — M86672 Other chronic osteomyelitis, left ankle and foot: Secondary | ICD-10-CM | POA: Diagnosis not present

## 2020-10-31 DIAGNOSIS — T8781 Dehiscence of amputation stump: Secondary | ICD-10-CM

## 2020-10-31 DIAGNOSIS — G9009 Other idiopathic peripheral autonomic neuropathy: Secondary | ICD-10-CM | POA: Diagnosis not present

## 2020-10-31 DIAGNOSIS — L97518 Non-pressure chronic ulcer of other part of right foot with other specified severity: Secondary | ICD-10-CM | POA: Diagnosis not present

## 2020-10-31 DIAGNOSIS — J449 Chronic obstructive pulmonary disease, unspecified: Secondary | ICD-10-CM | POA: Diagnosis not present

## 2020-10-31 MED ORDER — OXYCODONE HCL 5 MG PO TABS: 5.0000 mg | ORAL_TABLET | Freq: Four times a day (QID) | ORAL | 0 refills | Status: DC | PRN

## 2020-10-31 MED ORDER — DOXYCYCLINE HYCLATE 100 MG PO TABS: 100.0000 mg | ORAL_TABLET | Freq: Two times a day (BID) | ORAL | 0 refills | Status: DC

## 2020-10-31 NOTE — Progress Notes (Signed)
Post-Op Visit Note   Patient: [name].           Date of Birth: 17-Sep-1956           MRN: [id number] Visit Date: 10/31/2020 PCP: [name], MD  Chief Complaint: No chief complaint on file.   HPI:  HPI The patient is a 64 year old gentleman seen status post revision transmetatarsal amputation on the left.  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  There is scant serous drainage no foul odor  Visit Diagnoses:  1. Dehiscence of amputation stump (HCC)     Plan: Discussed with the patient that his next surgical option is likely below-knee amputation the patient is not in agreement with proceeding with this at this time we will call in some doxycycline he will continue with his daily Dial soap cleansing and dressing changes.  Minimize weightbearing he would like to keep his appointment with Dr. Sharol Given next Tuesday  Discussed with him return precautions.  Strict monitoring of his lower extremity he will call the on-call or proceed to the emergency department with any worsening over the weekend  Follow-Up Instructions: Return as scheduled.   Imaging: No results found.  Orders:  No orders of the defined types were placed in this encounter.  Meds ordered this encounter  Medications   doxycycline (VIBRA-TABS) 100 MG tablet    Sig: Take 1 tablet (100 mg total) by mouth 2 (two) times daily.    Dispense:  60 tablet    Refill:  0   oxyCODONE (OXY IR/ROXICODONE) 5 MG immediate release tablet    Sig: Take 1 tablet (5 mg total) by mouth every 6 (six) hours as needed for moderate pain (pain score 4-6).    Dispense:  30  tablet    Refill:  0     PMFS History: Patient Active Problem List   Diagnosis Date Noted   Dehiscence of amputation stump ([place])    Abscess of left foot 05/30/2020   Subacute osteomyelitis, left ankle and foot ([place])    Skin ulcers of both feet ([place]) 10/24/2017   Colovesical fistula    Benign neoplasm of cecum    Chronic venous insufficiency 07/20/2017   Chronic left-sided low back pain without sciatica 11/08/2016   Toe ulcer ([place]) 03/08/2016   Verruca 12/28/2015   Pre-ulcerative calluses 12/28/2015   History of adenomatous polyp of colon 06/09/2015   Lipoma 03/13/2014   Plantar warts 07/09/2012   Cellulitis 07/04/2012   Neuropathy 07/04/2012   Hyperglycemia 07/04/2012   HYPERTENSION, BENIGN 10/20/2010   EDEMA 08/20/2010   WISDOM TEETH EXTRACTION, HX OF 08/20/2010   HYPOALBUMINEMIA 08/19/2010   ANEMIA 08/19/2010   NICOTINE ADDICTION 08/19/2010   NECROTIZING PNEUMONIA 08/19/2010   OSTEOARTHRITIS 08/19/2010   Past Medical History:  Diagnosis Date   Arthritis    Cancer ([place])    kidney   Chronic kidney disease    mass r kidney - partial nephrectomy   COPD (chronic obstructive pulmonary disease) ([place])    Diverticulitis    History of kidney stones    Hx of adenomatous colonic polyps 06/09/2015   Hyperlipidemia  Hypertension    has previously been on medication but then taken off - 05/29/20 pt states he's never been treated for HTN   Neuromuscular disorder (HCC)    neuropathy lower legs and feet   Neuropathy    in both feet   Pneumonia 2011    Family History  Problem Relation Age of Onset   Heart disease Mother    Esophageal cancer Mother    Diabetes Father    Pancreatic cancer Father    Breast cancer Sister    Colon cancer Neg Hx    Rectal cancer Neg Hx    Stomach cancer Neg Hx    Colon polyps Neg Hx     Past Surgical History:  Procedure Laterality Date   AMPUTATION Bilateral 06/05/2013   Procedure: RIGHT GREAT  TOE AMPUTATION/LEFT GREAT TOE DEBRIDEMENT;  Surgeon: Wylene Simmer, MD;  Location: [facility];  Service: Orthopedics;  Laterality: Bilateral;   AMPUTATION Left 05/30/2020   Procedure: LEFT TRANSMETATARSAL AMPUTATION;  Surgeon: Newt Minion, MD;  Location: [facility];  Service: Orthopedics;  Laterality: Left;   APPLICATION OF WOUND VAC Left 05/30/2020   Procedure: APPLICATION OF WOUND VAC;  Surgeon: Newt Minion, MD;  Location: [facility];  Service: Orthopedics;  Laterality: Left;   COLON SURGERY     robotic partial colectomy Dr. Marcello Moores 12-30-17   COLONOSCOPY     x 2 last date 09/21/2017 bladder attached to colon eval   COLONOSCOPY WITH PROPOFOL N/A 09/21/2017   Procedure: COLONOSCOPY WITH PROPOFOL;  Surgeon: [name], MD;  [facility];  Service: Endoscopy;  Laterality: N/A;   MOUTH SURGERY     PARTIAL NEPHRECTOMY     right Dr. Leighton Ruff 05-05-43   ROBOTIC ASSITED PARTIAL NEPHRECTOMY Right 12/30/2017   Procedure: XI ROBOTIC ASSITED RIGHT PARTIAL NEPHRECTOMY;  Surgeon: Alexis Frock, MD;  [facility];  Service: Urology;  Laterality: Right;   STUMP REVISION Left 10/08/2020   Procedure: REVISION LEFT TRANSMETATARSAL AMPUTATION;  Surgeon: Newt Minion, MD;  Location: [facility];  Service: Orthopedics;  Laterality: Left;   TOE AMPUTATION Right 06/05/2013   RIGHT GREAT TOE PARTIAL AMPUTATION     TOOTH EXTRACTION  2011   multiple teeth extracted   Social History   Occupational History   Occupation: retired  Tobacco Use   Smoking status: Current Some Day Smoker    Packs/day: 0.20    Years: 43.00    Pack years: 8.60    Types: Cigarettes    Start date: 11/29/1973   Smokeless tobacco: Never Used   Tobacco comment: occasional smoker - ~1 pack/week. smoked ~1.5ppd until 5 yr ago  Vaping Use   Vaping Use: Never used  Substance and Sexual Activity   Alcohol use: No    Alcohol/week: 0.0 standard drinks   Drug use: No   Sexual activity: Not Currently    Partners: Female

## 2020-11-04 ENCOUNTER — Ambulatory Visit: Payer: Medicare PPO | Admitting: Physician Assistant

## 2020-11-04 DIAGNOSIS — J449 Chronic obstructive pulmonary disease, unspecified: Secondary | ICD-10-CM | POA: Diagnosis not present

## 2020-11-04 DIAGNOSIS — G9009 Other idiopathic peripheral autonomic neuropathy: Secondary | ICD-10-CM | POA: Diagnosis not present

## 2020-11-04 DIAGNOSIS — N189 Chronic kidney disease, unspecified: Secondary | ICD-10-CM | POA: Diagnosis not present

## 2020-11-04 DIAGNOSIS — K5792 Diverticulitis of intestine, part unspecified, without perforation or abscess without bleeding: Secondary | ICD-10-CM | POA: Diagnosis not present

## 2020-11-04 DIAGNOSIS — L97518 Non-pressure chronic ulcer of other part of right foot with other specified severity: Secondary | ICD-10-CM | POA: Diagnosis not present

## 2020-11-04 DIAGNOSIS — M86672 Other chronic osteomyelitis, left ankle and foot: Secondary | ICD-10-CM | POA: Diagnosis not present

## 2020-11-04 DIAGNOSIS — T8744 Infection of amputation stump, left lower extremity: Secondary | ICD-10-CM | POA: Diagnosis not present

## 2020-11-04 DIAGNOSIS — I129 Hypertensive chronic kidney disease with stage 1 through stage 4 chronic kidney disease, or unspecified chronic kidney disease: Secondary | ICD-10-CM | POA: Diagnosis not present

## 2020-11-04 DIAGNOSIS — T8781 Dehiscence of amputation stump: Secondary | ICD-10-CM | POA: Diagnosis not present

## 2020-11-06 ENCOUNTER — Ambulatory Visit (INDEPENDENT_AMBULATORY_CARE_PROVIDER_SITE_OTHER): Payer: Medicare PPO | Admitting: Orthopedic Surgery

## 2020-11-06 ENCOUNTER — Other Ambulatory Visit: Payer: Self-pay

## 2020-11-06 ENCOUNTER — Telehealth: Payer: Self-pay | Admitting: Orthopedic Surgery

## 2020-11-06 ENCOUNTER — Other Ambulatory Visit: Payer: Self-pay | Admitting: Orthopedic Surgery

## 2020-11-06 ENCOUNTER — Telehealth: Payer: Self-pay

## 2020-11-06 DIAGNOSIS — T8781 Dehiscence of amputation stump: Secondary | ICD-10-CM

## 2020-11-06 MED ORDER — OXYCODONE HCL 5 MG PO TABS: 5.0000 mg | ORAL_TABLET | Freq: Four times a day (QID) | ORAL | 0 refills | Status: DC | PRN

## 2020-11-06 NOTE — Telephone Encounter (Signed)
Patient called he is requesting rx for oxycodone. CB:[phone]

## 2020-11-06 NOTE — Telephone Encounter (Signed)
Pt s/p transmet revision requesting refill on oxycodone you saw him in the office this morning please advise.

## 2020-11-06 NOTE — Telephone Encounter (Signed)
Patient called about update of refill on oxycodone. Please call patient when medication has been called in. Patient phone number is [phone].

## 2020-11-06 NOTE — Telephone Encounter (Signed)
Rx sent, could only send 20 he got 30 1 week ago

## 2020-11-07 ENCOUNTER — Encounter: Payer: Self-pay | Admitting: Orthopedic Surgery

## 2020-11-07 DIAGNOSIS — J449 Chronic obstructive pulmonary disease, unspecified: Secondary | ICD-10-CM | POA: Diagnosis not present

## 2020-11-07 DIAGNOSIS — K5792 Diverticulitis of intestine, part unspecified, without perforation or abscess without bleeding: Secondary | ICD-10-CM | POA: Diagnosis not present

## 2020-11-07 DIAGNOSIS — T8744 Infection of amputation stump, left lower extremity: Secondary | ICD-10-CM | POA: Diagnosis not present

## 2020-11-07 DIAGNOSIS — N189 Chronic kidney disease, unspecified: Secondary | ICD-10-CM | POA: Diagnosis not present

## 2020-11-07 DIAGNOSIS — G9009 Other idiopathic peripheral autonomic neuropathy: Secondary | ICD-10-CM | POA: Diagnosis not present

## 2020-11-07 DIAGNOSIS — L97518 Non-pressure chronic ulcer of other part of right foot with other specified severity: Secondary | ICD-10-CM | POA: Diagnosis not present

## 2020-11-07 DIAGNOSIS — I129 Hypertensive chronic kidney disease with stage 1 through stage 4 chronic kidney disease, or unspecified chronic kidney disease: Secondary | ICD-10-CM | POA: Diagnosis not present

## 2020-11-07 DIAGNOSIS — M86672 Other chronic osteomyelitis, left ankle and foot: Secondary | ICD-10-CM | POA: Diagnosis not present

## 2020-11-07 DIAGNOSIS — T8781 Dehiscence of amputation stump: Secondary | ICD-10-CM | POA: Diagnosis not present

## 2020-11-07 NOTE — Progress Notes (Signed)
Office Visit Note   Patient: [name].           Date of Birth: [birth year]           MRN: [id number] Visit Date: 11/06/2020              Requested by: [name], MD [address] [place],  [place] [address] PCP: [name], MD  Chief Complaint  Patient presents with  . Left Foot - Follow-up      HPI: Patient is a 64 year old gentleman who presents for follow-up status post dehiscence of a transmetatarsal amputation.  Patient states he is currently on doxycycline.  He is using a nitroglycerin patch occasionally using crutches he is still smoking.  Assessment & Plan: Visit Diagnoses:  1. Dehiscence of amputation stump ([place])     Plan: Recommended the importance of smoking cessation strict nonweightbearing with either a kneeling scooter or a wheelchair dressing change daily.  Reevaluate in 2 weeks.  Follow-Up Instructions: Return in about 2 weeks (around 11/20/2020).   Ortho Exam  Patient is alert, oriented, no adenopathy, well-dressed, normal affect, normal respiratory effort. Examination patient has massive dehiscence of the transmetatarsal amputation there is no cellulitis no odor no drainage there is venous stasis swelling with brawny edema in the leg.  Patient states he wants to try everything possible to avoid a below-knee amputation.  Imaging: No results found. No images are attached to the encounter.  Labs: Lab Results  Component Value Date   HGBA1C 6.0 (H) 08/23/2017   HGBA1C 5.3 03/12/2014   HGBA1C 5.4 07/04/2012   ESRSEDRATE 14 06/14/2017   ESRSEDRATE 30 05/17/2017   ESRSEDRATE 23 (H) 01/31/2015   CRP 23.4 (H) 06/14/2017   CRP 37.4 (H) 05/17/2017   CRP 55.8 (H) 03/29/2017   REPTSTATUS 09/17/2010 FINAL 08/03/2010   GRAMSTAIN  08/02/2010    FEW WBC PRESENT, PREDOMINANTLY PMN NO SQUAMOUS EPITHELIAL CELLS SEEN RARE GRAM NEGATIVE RODS   CULT NO ACID FAST BACILLI ISOLATED IN 6 WEEKS 08/03/2010     Lab Results  Component Value Date    ALBUMIN 4.0 03/12/2014   ALBUMIN 3.0 (L) 07/04/2012   ALBUMIN 1.7 (L) 08/02/2010   PREALBUMIN 4.3 (L) 08/02/2010    Lab Results  Component Value Date   MG 2.4 07/04/2012   MG 2.2 08/01/2010   No results found for: VD25OH  Lab Results  Component Value Date   PREALBUMIN 4.3 (L) 08/02/2010   CBC EXTENDED Latest Ref Rng & Units 10/08/2020 05/30/2020 01/02/2018  WBC 4.0 - 10.5 K/uL 8.7 9.0 12.4(H)  RBC 4.22 - 5.81 MIL/uL 4.84 4.67 4.16(L)  HGB 13.0 - 17.0 g/dL 14.4 13.6 12.5(L)  HCT 39.0 - 52.0 % 46.9 43.3 38.4(L)  PLT 150 - 400 K/uL 323 445(H) 223  NEUTROABS 1.4 - 7.0 x10E3/uL - - -  LYMPHSABS 0.7 - 3.1 x10E3/uL - - -     There is no height or weight on file to calculate BMI.  Orders:  No orders of the defined types were placed in this encounter.  No orders of the defined types were placed in this encounter.    Procedures: No procedures performed  Clinical Data: No additional findings.  ROS:  All other systems negative, except as noted in the HPI. Review of Systems  Objective: Vital Signs: There were no vitals taken for this visit.  Specialty Comments:  No specialty comments available.  PMFS History: Patient Active Problem List   Diagnosis Date Noted  . Dehiscence  of amputation stump ([place])   . Abscess of left foot 05/30/2020  . Subacute osteomyelitis, left ankle and foot ([place])   . Skin ulcers of both feet ([place]) 10/24/2017  . Colovesical fistula   . Benign neoplasm of cecum   . Chronic venous insufficiency 07/20/2017  . Chronic left-sided low back pain without sciatica 11/08/2016  . Toe ulcer ([place]) 03/08/2016  . Verruca 12/28/2015  . Pre-ulcerative calluses 12/28/2015  . History of adenomatous polyp of colon 06/09/2015  . Lipoma 03/13/2014  . Plantar warts 07/09/2012  . Cellulitis 07/04/2012  . Neuropathy 07/04/2012  . Hyperglycemia 07/04/2012  . HYPERTENSION, BENIGN 10/20/2010  . EDEMA 08/20/2010  . WISDOM TEETH EXTRACTION, HX OF 08/20/2010  .  HYPOALBUMINEMIA 08/19/2010  . ANEMIA 08/19/2010  . NICOTINE ADDICTION 08/19/2010  . NECROTIZING PNEUMONIA 08/19/2010  . OSTEOARTHRITIS 08/19/2010   Past Medical History:  Diagnosis Date  . Arthritis   . Cancer ([place])    kidney  . Chronic kidney disease    mass r kidney - partial nephrectomy  . COPD (chronic obstructive pulmonary disease) ([place])   . Diverticulitis   . History of kidney stones   . Hx of adenomatous colonic polyps 06/09/2015  . Hyperlipidemia   . Hypertension    has previously been on medication but then taken off - 05/29/20 pt states he's never been treated for HTN  . Neuromuscular disorder (HCC)    neuropathy lower legs and feet  . Neuropathy    in both feet  . Pneumonia 2011    Family History  Problem Relation Age of Onset  . Heart disease Mother   . Esophageal cancer Mother   . Diabetes Father   . Pancreatic cancer Father   . Breast cancer Sister   . Colon cancer Neg Hx   . Rectal cancer Neg Hx   . Stomach cancer Neg Hx   . Colon polyps Neg Hx     Past Surgical History:  Procedure Laterality Date  . AMPUTATION Bilateral 06/05/2013   Procedure: RIGHT GREAT TOE AMPUTATION/LEFT GREAT TOE DEBRIDEMENT;  Surgeon: Wylene Simmer, MD;  Location: [facility];  Service: Orthopedics;  Laterality: Bilateral;  . AMPUTATION Left 05/30/2020   Procedure: LEFT TRANSMETATARSAL AMPUTATION;  Surgeon: Newt Minion, MD;  Location: [facility];  Service: Orthopedics;  Laterality: Left;  . APPLICATION OF WOUND VAC Left 05/30/2020   Procedure: APPLICATION OF WOUND VAC;  Surgeon: Newt Minion, MD;  Location: [facility];  Service: Orthopedics;  Laterality: Left;  . COLON SURGERY     robotic partial colectomy Dr. Marcello Moores 12-30-17  . COLONOSCOPY     x 2 last date 09/21/2017 bladder attached to colon eval  . COLONOSCOPY WITH PROPOFOL N/A 09/21/2017   Procedure: COLONOSCOPY WITH PROPOFOL;  Surgeon: [name], MD;  [facility];  Service: Endoscopy;  Laterality: N/A;  . MOUTH SURGERY    .  PARTIAL NEPHRECTOMY     right Dr. Leighton Ruff 03-01-14  . ROBOTIC ASSITED PARTIAL NEPHRECTOMY Right 12/30/2017   Procedure: XI ROBOTIC ASSITED RIGHT PARTIAL NEPHRECTOMY;  Surgeon: Alexis Frock, MD;  [facility];  Service: Urology;  Laterality: Right;  . STUMP REVISION Left 10/08/2020   Procedure: REVISION LEFT TRANSMETATARSAL AMPUTATION;  Surgeon: Newt Minion, MD;  Location: [facility];  Service: Orthopedics;  Laterality: Left;  . TOE AMPUTATION Right 06/05/2013   RIGHT GREAT TOE PARTIAL AMPUTATION    . TOOTH EXTRACTION  2011   multiple teeth extracted   Social History  Occupational History  . Occupation: retired  Tobacco Use  . Smoking status: Current Some Day Smoker    Packs/day: 0.20    Years: 43.00    Pack years: 8.60    Types: Cigarettes    Start date: 11/29/1973  . Smokeless tobacco: Never Used  . Tobacco comment: occasional smoker - ~1 pack/week. smoked ~1.5ppd until 5 yr ago  Vaping Use  . Vaping Use: Never used  Substance and Sexual Activity  . Alcohol use: No    Alcohol/week: 0.0 standard drinks  . Drug use: No  . Sexual activity: Not Currently    Partners: Female

## 2020-11-07 NOTE — Telephone Encounter (Signed)
Called and sw pt to advise that rx has been sent to pharm.

## 2020-11-10 ENCOUNTER — Other Ambulatory Visit: Payer: Self-pay

## 2020-11-10 ENCOUNTER — Encounter (HOSPITAL_BASED_OUTPATIENT_CLINIC_OR_DEPARTMENT_OTHER): Payer: Medicare PPO | Attending: Internal Medicine | Admitting: Internal Medicine

## 2020-11-10 DIAGNOSIS — G9009 Other idiopathic peripheral autonomic neuropathy: Secondary | ICD-10-CM | POA: Diagnosis not present

## 2020-11-10 DIAGNOSIS — L97518 Non-pressure chronic ulcer of other part of right foot with other specified severity: Secondary | ICD-10-CM | POA: Insufficient documentation

## 2020-11-10 DIAGNOSIS — Z89411 Acquired absence of right great toe: Secondary | ICD-10-CM | POA: Diagnosis not present

## 2020-11-11 DIAGNOSIS — I129 Hypertensive chronic kidney disease with stage 1 through stage 4 chronic kidney disease, or unspecified chronic kidney disease: Secondary | ICD-10-CM | POA: Diagnosis not present

## 2020-11-11 DIAGNOSIS — N189 Chronic kidney disease, unspecified: Secondary | ICD-10-CM | POA: Diagnosis not present

## 2020-11-11 DIAGNOSIS — K5792 Diverticulitis of intestine, part unspecified, without perforation or abscess without bleeding: Secondary | ICD-10-CM | POA: Diagnosis not present

## 2020-11-11 DIAGNOSIS — J449 Chronic obstructive pulmonary disease, unspecified: Secondary | ICD-10-CM | POA: Diagnosis not present

## 2020-11-11 DIAGNOSIS — M86672 Other chronic osteomyelitis, left ankle and foot: Secondary | ICD-10-CM | POA: Diagnosis not present

## 2020-11-11 DIAGNOSIS — T8781 Dehiscence of amputation stump: Secondary | ICD-10-CM | POA: Diagnosis not present

## 2020-11-11 DIAGNOSIS — G9009 Other idiopathic peripheral autonomic neuropathy: Secondary | ICD-10-CM | POA: Diagnosis not present

## 2020-11-11 DIAGNOSIS — T8744 Infection of amputation stump, left lower extremity: Secondary | ICD-10-CM | POA: Diagnosis not present

## 2020-11-11 DIAGNOSIS — L97518 Non-pressure chronic ulcer of other part of right foot with other specified severity: Secondary | ICD-10-CM | POA: Diagnosis not present

## 2020-11-13 ENCOUNTER — Other Ambulatory Visit: Payer: Self-pay | Admitting: Physician Assistant

## 2020-11-13 ENCOUNTER — Telehealth: Payer: Self-pay | Admitting: Orthopedic Surgery

## 2020-11-13 MED ORDER — OXYCODONE HCL 5 MG PO TABS: 5.0000 mg | ORAL_TABLET | Freq: Four times a day (QID) | ORAL | 0 refills | Status: DC | PRN

## 2020-11-13 NOTE — Telephone Encounter (Signed)
I called pt to advise that rx has been sent to pharm to call with any other questions.  

## 2020-11-13 NOTE — Telephone Encounter (Signed)
Pt s/p transmet revision asking for refill on pain medication please advise.

## 2020-11-13 NOTE — Progress Notes (Signed)
[name], [name] ([id number]) Visit Report for 11/10/2020 HPI Details Patient Name: Date of Service: [name], [name] 11/10/2020 10:30 A M Medical Record Number: [id number] Patient Account Number: [id number] Date of Birth/Sex: Treating RN: July 25, 1956 (64 y.o. [name] Primary Care Provider: [name] Other Clinician: Referring Provider: Treating Provider/Extender: [name] in Treatment: 20 History of Present Illness Location: right first metatarsal head on the plantar aspect and the left big toe and the right fifth toe Quality: Patient reports No Pain. Severity: Patient states wound(s) are getting worse. Duration: Patient has had the wound for > 24 months prior to seeking treatment at the wound center Context: The wound would happen gradually Modifying Factors: Wound improving due to current treatment. he has been under podiatry care for over a year and a half ssociated Signs and Symptoms: Patient reports having: heaviness in both legs A HPI Description: This 64 year old gentleman who has had a long-standing neuropathy of both feet without history of diabetes mellitus has never been worked up for his neurological problem in the last 5 years or so. Most recently he has been seen by the podiatrist Dr. [name] who was been seeing them for about a year and a half and from what I understand has been treating him for bilateral ulceration on the feet, the right being in the region of the first metatarsal head plantar aspect and the left being in the area of the left big toe. He's had various antibiotics including Levaquin recently and this was then switched to Cipro and clindamycin. Due to the nature of this wound which has been there for a long while he has been recently referred to [name] for an opinion. An MRI was done in the middle of June which showed several small abscesses near the amputation site of the right great toe with draining and  open wounds with surrounding cellulitis but no evidence of septic arthritis, osteomyelitis or pyomyositis. I understand at this stage he was taken to the operating room by [name] who debrided the wound and cleaned out the abscess. I understand ABIs were done bilaterally and they were within normal limits at Dr. [name] office. The patient is a smoker and is working on quitting smoking. 07/13/2017 -- had a lower extremity arterial duplex evaluation which showed patent bilateral lower extremity arteries without evidence of hemodynamically significant stenosis. His right ABI was 1.05 and the left was 0.95 He had a lower extremity venous duplex reflux evaluation done which showed significant venous incompetence in the bilateral common femoral, saphenofemoral junction, small saphenous vein and in the right great saphenous vein. With these findings I believe he will benefit from a consultation with the vascular surgeon regarding the endovenous ablation procedure. the patient's neurology appointment and dermatology appointment is still pending. He has had a x-ray done by his PCP and the results are not available. However he did see [name] who I understand has ordered a bilateral MRI of his feet. 07/26/2017 -- was seen by [name] on 07/20/2017. His impression was that he presented with minimal bilateral lower extremity peripheral arterial disease, bilateral lower extremity chronic venous insufficiency (C4) and radiculopathy versus neuropathy bilateral lower extremity. No arterial intervention was needed at the present time. He recommended maximal medical management at this stage for his atherosclerotic disease . He recommended compression to be continued and no venous intervention at the present time. He was offered follow-up in the Vein clinic in 3 months time. MR  of the right foot done with and without contrast -- IMPRESSION:Wound on the plantar surface of the foot at approximately the  level of first MTP joint with a tiny underlying soft tissue abscess. Large area of soft tissue edema subjacent to the wound is most compatible with granulation tissue. Negative for osteomyelitis about the first MTP joint. Mild edema and enhancement in the proximal phalanx of the little toe is stable compared to the prior examination and may be due to stress change. Infection is possible but thought highly unlikely. Subcutaneous edema over the dorsum of the foot compatible with dependent change/cellulitis. the patient also has had a abdominal CT which shows a kidney mass and is going to have her MRI for this. He also has a urology opinion pending and a general surgeon's opinion regarding a colonic mass. 08/02/2017 -- his appointments with his other urology and surgical consultants is still later. He has an MRI of the left foot later this week. 08/09/2017 --MR of the left foot with and without contrast -- IMPRESSION:Large appearing skin wound on the medial aspect of the great toe without underlying abscess or osteomyelitis.First MTP osteoarthritis. Marked marrow edema in the medial sesamoid bone is likely related to osteoarthritis but could be due to sesamoiditis. 08/16/2017 -- the patient has had a surgical opinion and a urological opinion and a extensive surgery including possible colon resection, bladder surgery and kidney surgery is being planned. No date has been set for his procedure He continues to be off smoking 08/31/2017 -- he was seen by the neurologist for his multiple problems of neuropathic and radicular symptoms in both legs with a thorough workup done. MRIs were reviewed lab work was reviewed and the assessment was that he had severe neuropathy due to multifactorial reasons and a neuropathy panel of lab work was ordered and he would be reviewed back. Patient is also due for colonoscopy to be repeated before his bladder and kidney surgery. At the present time he would like [name] to  continue with conservative and symptomatic wound care for both his feet. 10/05/2017 -- since I have seen him last time he has had a colonoscopy which was fairly normal except for a polyp and he has a urology appointment pending later today. He still continues to stay off smoking. 10/26/2017-- he returns after about 3 weeks and in the meanwhile he was seen by Dr. Kellie Simmering, who reviewed his venous studies and noted that there was very minimal enlargement of the bilateral great saphenous vein on the right and somewhat larger vein on the left but no consistent reflux throughout the great saphenous veins and there was also some right small saphenous veins which were enlarged with some reflux. However overall he thought that venous insufficiency was not significantly the cause of his ulceration and it was more of trophic ulcers and hence he recommended aggressive wound care, and at some stage he may need amputation of the left first toe and the right fifth toe. 11/09/2017 -- due to the inclement weather he has missed his urology appointment and is still awaiting the rescheduled one. Other than that he's been doing fairly well. 11/23/2017 -- he has had his cystoscopy done by his urologist and they found multiple areas of concern and he is going to be set up for a procedure sometime later in January. He is on complex care as far as his wounds go and seizures every 2-3 weeks 12/07/17 patient presents today for evaluation concerning his right great toe amputation site as  well as his left great toe ulcer. He has been tolerating the dressing changes fairly well. With that being said there does not appear to be any significant discomfort at this point that he is experiencing although he has quite significant callous especially in regard to the amputation site of the right great toe. Nonetheless he has no evidence of infection which is good news No fevers, chills, nausea, or vomiting noted at this time. He has no  pain. READMISSION 05/09/18; is a patient who hasn't been here in quite some time. He is not a diabetic but he has severe idiopathic peripheral neuropathy. He was here for review of a nonhealing wound on the right great toe amputation site and a large portion of the tip of the left great toe. Sometime after his last visit here he became ill he had abdominal issues infections and required hospitalizations. He largely dropped off the map. He is putting silver alginate on the wounds. I note that he had arterial studies and saw Dr. Bridgett Larsson of vascular surgery was not felt to have an arterial issue. He also had reflux studies and he wears compression stockings and he is faithful with these. He had MRI of both of his feet that not show osteomyelitis question small abscess on the right. He tells me that the nonhealing wound on his right great toe amputation site is been there since the actual amputation which was in 2014 I note the area was aggressively debrided by Dr. Jacqualyn Posey of podiatry before he started coming to this clinic apparently there were abscess is present at that time. His last formal arterial studies were done in August 2018 which time his ABI was 1.05 on the right and 0.95 on the left he had biphasic and triphasic waveforms on the left and monophasic and biphasic waveforms on the right. He did not have TBI's. ABIs in our clinic today were 0.8 bilaterally. The patient states he is not a diabetic. He does have idiopathic peripheral neuropathy. He is a smoker but is trying to quit with Chantix. 05/16/18; x-rays of his bilateral feet were negative for osteomyelitis. He was noted to have a chronic deformity of the head of the second metatarsal which may be due to previous osteonecrosis there was no definite underlying bone destruction to suggest osteomyelitis on either side. He came in with the extensive callus over his right great toe amputation site and the left great toe did extensive debridement last  week and another extensive debridement today.patient is changing his dressing himself with silver alginate 05/30/18; still buildup of thick callus covering thick subcutaneous tissue around both of these areas. This requires an extensive debridement on both sides. I have discussed offloading this area with the patient. I think he inverts when he walks. His foot sizes beyond what we can put in a Darco forefoot off loader or probably what we can put in a total contact cast. 06/13/18; still a remarkable buildup of callus over the right first toe amputation site. The left first toe has 2 open wounds one medially and 1 at the tip of the toe. Surrounding callus is also not back here. We've been using silver alginate. He has a darco forefoot off loader now on the right foot.his own shoe on the left foot. Would like to try a total contact cast on the right. He is making arrangements for someone to drive him here in 2 weeks. 06/29/18; still a buildup of callus over the right first toe amputation site although not  as significant as previously. The left first toe has 2 open wounds one medially and one at the tip surrounding callus as well. We've been using silver alginate. He comes in today with the right for a total contact cast which I'll use on the right 07/04/18 on evaluation today patient appears to be doing decently well in regard to the cast. Unfortunately he did have a little area that rubbed on the lateral aspect of his leg the cast actually cracked on the medial aspect this happened he states just yesterday. He has not been walking in the cast without the boot portion on. With that being said he states that he is unsure of exactly what happened with the crack. Nonetheless this does seem to have caused a little bit of rubbing on the lateral aspect I think due to the integrity of the cast being compromised. Nonetheless I advised him that if this happens in the future he needs to let us know soon as possible.  Nonetheless I do feel like the cast has been of benefit some of the area on the distal portion of his great toe amputation site actually appears to be doing better. 07/12/18 on evaluation today patient appears to be doing very well in regard to his right great toe invitation site. This seems to be showing signs of excellent improvement which is great news. There does not appear to be any evidence of infection at this time. With that being said he has been tolerating the dressing changes without complication. There appears to be no evidence of infection and again this is barely open at this point. The total contact cast has been of excellent benefit for him. I believe this will likely be completely closed and ready next week that we can switch to a cast on the left foot. He states he may want to take a week's break. 07/19/18 on evaluation today patient appears to be doing better in regard to both ulceration areas. The right foot actually appears to be completely healed he still has some callous but no open wound. The left foot/great toe actually seems to be better after last week's debridement overall the appearance of the wound is greatly improved. 08/09/18 on evaluation today patient actually appears to be doing a little bit worse in regard to the right metatarsal invitation site. He's been tolerating the dressing changes without complication. With that being said he does note that the area actually cracked open as of today when he was in the shower trying to scrub it. Nonetheless it does appear that the Caryn Section that previously was open and draining has subsequently reopened and is draining again. Nonetheless as I was looking at him and performing the debridement today I did inquire about whether or not we had ever biopsy the area as I was concerned about the possibility for something along the lines of a work type virus/infection causing the excessive callous buildup. Especially since he seems to grow  much more than what would just be traditionally expected by friction alone. He subsequently told me that he did have this biopsied somewhere around 1-2 years ago by Dr. Jacqualyn Posey and that it showed that he had a wart infection at that point. Nonetheless he was never referred to dermatology better Jacqualyn Posey attempted to cut it out according to the patient. Nonetheless that may be part of the big issue with what we're dealing with here and without treating the wart infection we may not actually get this to heal and stay closed.  READMISSION 12/07/2018 The patient is back in clinic today essentially with wounds in the same area and condition is that I think the last 2 times he has been here. This includes the right first metatarsal head in the setting of a previous right great toe amputation and a large part of the medial part of his left great toe. The patient states he has been dealing with this for years dating back to 2005 on and off. That he is never found this to be totally healed. When he was last in this clinic he had this biopsied shave biopsies which showed hyperkeratotic and parakeratotic debris. Epithelium with papillary features negative for malignancy. He has been using silver alginate. He has healing sandals which he uses Felt on the bottom of these. When he was here last time we put him a total contact cast at least for a while and the patient states on the right that helped with the buildup of hyperkeratotic tissue and that seems to be reflective in our notes. He also states that was previously biopsied by Dr. Jacqualyn Posey of podiatry. The patient has a severe idiopathic peripheral neuropathy which he attributes to a brown recluse spider bite in 2002 or so. He is not a diabetic The patient had arterial studies in this showed an ABI in the right of 1.05 on the left was 0.95. TBI's were not done. He had monophasic waveforms at the right posterior tibial biphasic at the dorsalis pedis. On the left he  was biphasic at the posterior tibial and triphasic at the dorsalis pedis. 1/17; this is a patient with a very challenging issue was readmitted to the clinic last week. He has a large area over the first right MTP in the setting of a previous right great toe amputation. On presentation this is covered with copious amounts of hyperkeratotic thick callus nonviable tissue. When this is debridement both on this occasion and previous occasions in this clinic he has a very boggy presentation to the subcutaneous tissue which looks somewhat atypical. He has a similar area on the left great toe from the tip of the toe down the medial aspect beyond the interphalangeal joint. He's had previous MRIs of both of his feet. On the right that showed small abscesses and he was taken to the OR by podiatry for debridement and cleaning out of the abscesses. An MRI did not show osteomyelitis of the left first toe. Both the MRIs were done in 2018. He saw Dr. Vallarie Mare at the time who did not think he had significant macrovascular disease. He is not a diabetic but he has idiopathic peripheral neuropathy and was a significant smoker. When he was in the clinic earlier in 2019 he had biopsies done of both areas that showed hyperkeratotic material but no malignancy. X-rays I ordered last week did not show osteomyelitis on the left but did suggest osteomyelitis at the tip of the left great toe. I looked at the x-rays myself this is a subtle finding it is only seen on the lateral film. I did a very significant debridement on him last week bilaterally in both the wound areas look better. When he was previously in our clinic the area on the right did improve with a total contact cast. He dropped out of the schedule at that point I'm not exactly sure why 1/24; the biopsy I did of the area over his right first metatarsal head showed the possibility of verruca vulgaris. Superimposed changes of lichen simplex chronicus. If this is  a plantar  wart this would be extremely large. I think he would need topical destructive therapy and I am going to refer him to dermatology. There was no suggestion of malignancy. Previous biopsies in this clinic on both his areas did not show evidence of malignancy either. Until we get dermatology I am going to continue with silver alginate. X-rays suggested osteomyelitis of the left toe I am going to continue him on trimethoprim sulfamethoxazole 2/3; not much change in this area on the right first met head. Extensive dark eschar formation around the central area of nonviable subcutaneous tissue looking like a boggy subcutaneous tissue. Using a #10 scalpel and pickups I removed all of this. I have tried this before and this man but I plan to put him in a total contact cast next week Using a #5 curette debridement of the left great toe He has an appointment with dermatology at [facility] on the 25th 2/11; right first metatarsal head after last week's extensive debridement is right back to the way it was with a nonviable subcutaneous tissue and thick surrounding eschar. The left great toe looks about the same. The patient could not have a total contact cast today because he had household problems [leaking roof] but I am not going to put a cast on him until I have information from the dermatologist about this. READMISSION 06/17/2020 Mr. Mcclune is now a 64 year old man that we have had in this clinic on at least 2 occasions previously. When he was last here in February 2020 I referred him to dermatology at [facility] for their review of large wounds on the right first metatarsal head with surrounding severe chronic hyperkeratosis. This was biopsied on at least 2 occasions in our clinic and I think at [facility] as well that did not show an underlying malignancy. I have reviewed the consult from Dr. Sharlette Dense dermatology from 01/23/2019. At that time the patient had wounds on the plantar right first metatarsal head as well as the  plantar left great toe. From what he is saying the left great toe healed however he recently had to have a left TMA by Dr. 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 Dermatology at [facility] apparently referred him to Dr. Luetta Nutting of plastic surgery and an operative excision of this area was planned however apparently they ran into the pandemic. The procedure was repetitively cancel and the patient did not keep his final appointment which I think was earlier this year he was also seen in February of this year at the wound care center in [facility] by Dr. Zigmund Daniel. They were applying Silvadene cream and Xeroform which she is essentially doing now. I see in his notes that there was still some concern about underlying malignancy although would be difficult to believe as long as this is been there that there would not be more extensive damage at this point in any case he is back in our clinic for review of this. He had an x-ray of the foot on 01/23/2020 that was negative for osteomyelitis acute fracture. Past medical history; idiopathic peripheral neuropathy, right great toe amputation in 2011, lumbar spinal stenosis, left transmetatarsal amputation on 05/30/2020 by Dr. Sharol Given and more recently apparently a slight wound dehiscence. He also had a fracture of his left tibial plateau sometime in 2020. ABI in our clinic was 0.95 8/9-Patient returns after being established in the clinic last time, he has a new open area on the  plantar aspect of his right second toe, the right foot plantar ulcer below the great toe on the right looks slightly smaller, using PolyMem. Patient attends to his surgeon for the left foot wound 8/23; this is a patient with a refractory wound over his right first metatarsal head in the setting of severe surrounding hyperkeratosis. I had referred him to dermatology subsequently came to the attention of Dr. Vernona Rieger and Dr. Zigmund Daniel at [facility]. He also had a  left TMA by Dr. Sharol Given on the left foot because of osteomyelitis I believe. He is in bilateral surgical shoes. 9/13; refractory wound over the right first metatarsal head with surrounding hyperkeratosis Verrucous skin. He had a left transmetatarsal amputation by Dr. Sharol Given. We have not been following this however this apparently is still not closed. I have looked over his notes from [facility]. This is well summarized by a note from Dr. Zigmund Daniel on February 24. The patient had had several biopsies of the skin of the first toe metatarsal head. As well as the left great toe which has since been amputated. The pathology results showed Stratus corneum with hyperkeratosis and parakeratosis. An underlying verricous carcinoma could not be excluded. I think the patient was given follow-up but I will actually think that he kept his appointments. He was sent back to follow-up with him with them again in February 2021. Dr. Luetta Nutting who is the plastic surgeon recommended wound care rather than extensive debridement. I am very doubtful that this represents any form of malignancy. This patient has been dealing with this wound for years and I think we would have had a declaration of status well before this if this were any form of malignancy. I also do not believe he has an arterial issue. ABI in the clinic here was 0.95. He has easily palpable peripheral pulses. 10/4; generally gradually better wound area over the right plantar foot first metatarsal head.Marland Kitchen He still has thick surrounding callus around the wound margins and a fibrinous debris over the wound surface although in general a lot of this looks considerably better. He has been following with orthopedics for a left amputation by Dr. Sharol Given. We have not been following this 10/18; first metatarsal head wound is still open however there is less adherent callus and thick skin around the edge of this. I think this largely has to do with aggressive debridement last time and I am  going to repeat this today. He is still dealing with a wound on the left transmetatarsal amputation site this is being followed by Dr. Sharol Given. He has a surgical shoe on the left foot we have not been following this. He is using crutches apparently reliably to offload both his feet. 11/1 right first metatarsal head. Smaller surface area considerable amount of callus around this wound. This is a chronic issue. He has had a left transmetatarsal amputation followed by Dr. Sharol Given there is still wounds in this area he is followed with Dr. Sharol Given has a surgical shoe here 11/15; right first metatarsal head I think the wound is about the same substantial surrounding callus. This is a chronic issue. He apparently has been back to the OR with Dr. Sharol Given with regards to his left foot wound/amputation site he comes in today with a new surgical wound and a wound VAC with a full canister. The relevance of this to [name] as I had really wanted to put this man in a total contact cast but I cannot do that as long as he is in a  surgical boot on the left side 11/29; right first metatarsal head about the same. Substantial amount of surrounding callus. This is easily removed however a lot of bleeding of underlying tissue. This is been this way for a long time. Apparently he has had more surgery on the left foot we are not following this this is been followed by his surgeon Dr. Sharol Given. We have not looked at this we have been using polymen over the 12/13; right first metatarsal head some improvement. Aggressive debridement last time. We have been using polymen Ag. He has been back to see Dr. Sharol Given still with an open wound on the plantar left foot we have not been following this but I have been reassured by the patient that it is still open Electronic Signature(s) Signed: 11/13/2020 8:09:40 AM By: [name] MD Entered By: [name] on 11/10/2020  11:20:43 -------------------------------------------------------------------------------- Physical Exam Details Patient Name: Date of Service: [name], [name] 11/10/2020 10:30 A M Medical Record Number: [id number] Patient Account Number: [id number] Date of Birth/Sex: Treating RN: [birth year] (64 y.o. [name] Primary Care Provider: [name] Other Clinician: Referring Provider: Treating Provider/Extender: [name] in Treatment: 20 Constitutional Patient is hypertensive.. Pulse regular and within target range for patient.Marland Kitchen Respirations regular, non-labored and within target range.. Temperature is normal and within the target range for the patient.Marland Kitchen Appears in no distress. Cardiovascular Does not appear to be an arterial issue. Notes Wound exam; right first metatarsal head. Previous right first toe amputation. Not as much thick callus as usual and the open area is not quite as large still some depth. This does not probe to deep structures muscle or bone. I elected not to go ahead and debride this today as it just does not seem to have made a measurable amount of difference for the amount of debridement that has to be done along with the cauterization. Electronic Signature(s) Signed: 11/13/2020 8:09:40 AM By: [name] MD Entered By: [name] on 11/10/2020 11:22:26 -------------------------------------------------------------------------------- Physician Orders Details Patient Name: Date of Service: [name], [name] 11/10/2020 10:30 A M Medical Record Number: [id number] Patient Account Number: [id number] Date of Birth/Sex: Treating RN: 30-Mar-1956 (64 y.o. [name] Primary Care Provider: [name] Other Clinician: Referring Provider: Treating Provider/Extender: [name] in Treatment: 20 Verbal / Phone Orders: No Diagnosis Coding ICD-10 Coding Code Description L97.518  Non-pressure chronic ulcer of other part of right foot with other specified severity G90.09 Other idiopathic peripheral autonomic neuropathy Follow-up Appointments Return appointment in 1 month. Bathing/ Shower/ Hygiene May shower with protection but do not get wound dressing(s) wet. Off-Loading Open toe surgical shoe to: - right foot Home Health No change in wound care orders this week; continue Home Health for wound care. May utilize formulary equivalent dressing for wound treatment orders unless otherwise specified. Alvis Lemmings Wound Treatment Wound #12 - Metatarsal head first Wound Laterality: Right Cleanser: Wound Cleanser (Home Health) Every Other Day/15 Days Discharge Instructions: Cleanse the wound with wound cleanser or normal saline prior to applying a clean dressing using gauze sponges, not tissue or cotton balls. Peri-Wound Care: Sween Lotion (Moisturizing lotion) (Home Health) Every Other Day/15 Days Discharge Instructions: Apply moisturizing lotion as directed Prim Dressing: PolyMem Silver Non-Adhesive Dressing, 4.25x4.25 in [facility]) Every Other Day/15 Days ary Discharge Instructions: Apply to wound bed as instructed Secondary Dressing: Woven Gauze Sponge, Non-Sterile 4x4 in [facility]) Every Other Day/15 Days Discharge Instructions: Apply over primary  dressing as directed. Secondary Dressing: Felt 2.5 yds x 5.5 in [facility]) Every Other Day/15 Days Discharge Instructions: Apply felt or foam donut over primary dressing as directed. Secured With: [employer], 4.5x3.1 (in/yd) [facility]) Every Other Day/15 Days Discharge Instructions: Secure with Kerlix as directed. Secured With: Paper Tape, 2x10 (in/yd) (Home Health) Every Other Day/15 Days Discharge Instructions: Secure dressing with tape as directed. Electronic Signature(s) Signed: 11/11/2020 5:24:10 PM By: Levan Hurst RN, BSN Signed: 11/13/2020 8:09:40 AM By: [name] MD Entered By: Levan Hurst on 11/10/2020 11:13:01 -------------------------------------------------------------------------------- Problem List Details Patient Name: Date of Service: [name], [name] 11/10/2020 10:30 A M Medical Record Number: [id number] Patient Account Number: [id number] Date of Birth/Sex: Treating RN: Aug 03, 1956 (64 y.o. [name] Primary Care Provider: [name] Other Clinician: Referring Provider: Treating Provider/Extender: [name] in Treatment: 20 Active Problems ICD-10 Encounter Code Description Active Date MDM Diagnosis L97.518 Non-pressure chronic ulcer of other part of right foot with other specified 06/17/2020 No Yes severity G90.09 Other idiopathic peripheral autonomic neuropathy 06/17/2020 No Yes Inactive Problems Resolved Problems Electronic Signature(s) Signed: 11/13/2020 8:09:40 AM By: [name] MD Entered By: [name] on 11/10/2020 11:19:03 -------------------------------------------------------------------------------- Progress Note Details Patient Name: Date of Service: [name], [name] 11/10/2020 10:30 A M Medical Record Number: [id number] Patient Account Number: [id number] Date of Birth/Sex: Treating RN: 07-03-1956 (64 y.o. [name] Primary Care Provider: [name] Other Clinician: Referring Provider: Treating Provider/Extender: [name] in Treatment: 20 Subjective History of Present Illness (HPI) The following HPI elements were documented for the patient's wound: Location: right first metatarsal head on the plantar aspect and the left big toe and the right fifth toe Quality: Patient reports No Pain. Severity: Patient states wound(s) are getting worse. Duration: Patient has had the wound for > 24 months prior to seeking treatment at the wound center Context: The wound would happen gradually Modifying Factors: Wound improving due to current  treatment. he has been under podiatry care for over a year and a half Associated Signs and Symptoms: Patient reports having: heaviness in both legs This 64 year old gentleman who has had a long-standing neuropathy of both feet without history of diabetes mellitus has never been worked up for his neurological problem in the last 5 years or so. Most recently he has been seen by the podiatrist Dr. [name] who was been seeing them for about a year and a half and from what I understand has been treating him for bilateral ulceration on the feet, the right being in the region of the first metatarsal head plantar aspect and the left being in the area of the left big toe. He's had various antibiotics including Levaquin recently and this was then switched to Cipro and clindamycin. Due to the nature of this wound which has been there for a long while he has been recently referred to [name] for an opinion. An MRI was done in the middle of June which showed several small abscesses near the amputation site of the right great toe with draining and open wounds with surrounding cellulitis but no evidence of septic arthritis, osteomyelitis or pyomyositis. I understand at this stage he was taken to the operating room by [name] who debrided the wound and cleaned out the abscess. I understand ABIs were done bilaterally and they were within normal limits at Dr. [name] office. The patient is a smoker and is working on quitting smoking.  07/13/2017 -- had a lower extremity arterial duplex evaluation which showed patent bilateral lower extremity arteries without evidence of hemodynamically significant stenosis. His right ABI was 1.05 and the left was 0.95 He had a lower extremity venous duplex reflux evaluation done which showed significant venous incompetence in the bilateral common femoral, saphenofemoral junction, small saphenous vein and in the right great saphenous vein. With these findings I believe he will  benefit from a consultation with the vascular surgeon regarding the endovenous ablation procedure. the patient's neurology appointment and dermatology appointment is still pending. He has had a x-ray done by his PCP and the results are not available. However he did see [name] who I understand has ordered a bilateral MRI of his feet. 07/26/2017 -- was seen by [name] on 07/20/2017. His impression was that he presented with minimal bilateral lower extremity peripheral arterial disease, bilateral lower extremity chronic venous insufficiency (C4) and radiculopathy versus neuropathy bilateral lower extremity. No arterial intervention was needed at the present time. He recommended maximal medical management at this stage for his atherosclerotic disease . He recommended compression to be continued and no venous intervention at the present time. He was offered follow-up in the Vein clinic in 3 months time. MR of the right foot done with and without contrast -- IMPRESSION:Wound on the plantar surface of the foot at approximately the level of first MTP joint with a tiny underlying soft tissue abscess. Large area of soft tissue edema subjacent to the wound is most compatible with granulation tissue. Negative for osteomyelitis about the first MTP joint. Mild edema and enhancement in the proximal phalanx of the little toe is stable compared to the prior examination and may be due to stress change. Infection is possible but thought highly unlikely. Subcutaneous edema over the dorsum of the foot compatible with dependent change/cellulitis. the patient also has had a abdominal CT which shows a kidney mass and is going to have her MRI for this. He also has a urology opinion pending and a general surgeon's opinion regarding a colonic mass. 08/02/2017 -- his appointments with his other urology and surgical consultants is still later. He has an MRI of the left foot later this week. 08/09/2017 --MR of the left  foot with and without contrast -- IMPRESSION:Large appearing skin wound on the medial aspect of the great toe without underlying abscess or osteomyelitis.First MTP osteoarthritis. Marked marrow edema in the medial sesamoid bone is likely related to osteoarthritis but could be due to sesamoiditis. 08/16/2017 -- the patient has had a surgical opinion and a urological opinion and a extensive surgery including possible colon resection, bladder surgery and kidney surgery is being planned. No date has been set for his procedure He continues to be off smoking 08/31/2017 -- he was seen by the neurologist for his multiple problems of neuropathic and radicular symptoms in both legs with a thorough workup done. MRIs were reviewed lab work was reviewed and the assessment was that he had severe neuropathy due to multifactorial reasons and a neuropathy panel of lab work was ordered and he would be reviewed back. Patient is also due for colonoscopy to be repeated before his bladder and kidney surgery. At the present time he would like [name] to continue with conservative and symptomatic wound care for both his feet. 10/05/2017 -- since I have seen him last time he has had a colonoscopy which was fairly normal except for a polyp and he has a urology appointment pending later today. He still  continues to stay off smoking. 10/26/2017-- he returns after about 3 weeks and in the meanwhile he was seen by Dr. Kellie Simmering, who reviewed his venous studies and noted that there was very minimal enlargement of the bilateral great saphenous vein on the right and somewhat larger vein on the left but no consistent reflux throughout the great saphenous veins and there was also some right small saphenous veins which were enlarged with some reflux. However overall he thought that venous insufficiency was not significantly the cause of his ulceration and it was more of trophic ulcers and hence he recommended aggressive wound care, and  at some stage he may need amputation of the left first toe and the right fifth toe. 11/09/2017 -- due to the inclement weather he has missed his urology appointment and is still awaiting the rescheduled one. Other than that he's been doing fairly well. 11/23/2017 -- he has had his cystoscopy done by his urologist and they found multiple areas of concern and he is going to be set up for a procedure sometime later in January. He is on complex care as far as his wounds go and seizures every 2-3 weeks 12/07/17 patient presents today for evaluation concerning his right great toe amputation site as well as his left great toe ulcer. He has been tolerating the dressing changes fairly well. With that being said there does not appear to be any significant discomfort at this point that he is experiencing although he has quite significant callous especially in regard to the amputation site of the right great toe. Nonetheless he has no evidence of infection which is good news No fevers, chills, nausea, or vomiting noted at this time. He has no pain. READMISSION 05/09/18; is a patient who hasn't been here in quite some time. He is not a diabetic but he has severe idiopathic peripheral neuropathy. He was here for review of a nonhealing wound on the right great toe amputation site and a large portion of the tip of the left great toe. Sometime after his last visit here he became ill he had abdominal issues infections and required hospitalizations. He largely dropped off the map. He is putting silver alginate on the wounds. I note that he had arterial studies and saw Dr. Bridgett Larsson of vascular surgery was not felt to have an arterial issue. He also had reflux studies and he wears compression stockings and he is faithful with these. He had MRI of both of his feet that not show osteomyelitis question small abscess on the right. He tells me that the nonhealing wound on his right great toe amputation site is been there since the  actual amputation which was in 2014 I note the area was aggressively debrided by Dr. Jacqualyn Posey of podiatry before he started coming to this clinic apparently there were abscess is present at that time. His last formal arterial studies were done in August 2018 which time his ABI was 1.05 on the right and 0.95 on the left he had biphasic and triphasic waveforms on the left and monophasic and biphasic waveforms on the right. He did not have TBI's. ABIs in our clinic today were 0.8 bilaterally. The patient states he is not a diabetic. He does have idiopathic peripheral neuropathy. He is a smoker but is trying to quit with Chantix. 05/16/18; x-rays of his bilateral feet were negative for osteomyelitis. He was noted to have a chronic deformity of the head of the second metatarsal which may be due to previous osteonecrosis there was  no definite underlying bone destruction to suggest osteomyelitis on either side. He came in with the extensive callus over his right great toe amputation site and the left great toe did extensive debridement last week and another extensive debridement today.patient is changing his dressing himself with silver alginate 05/30/18; still buildup of thick callus covering thick subcutaneous tissue around both of these areas. This requires an extensive debridement on both sides. I have discussed offloading this area with the patient. I think he inverts when he walks. His foot sizes beyond what we can put in a Darco forefoot off loader or probably what we can put in a total contact cast. 06/13/18; still a remarkable buildup of callus over the right first toe amputation site. The left first toe has 2 open wounds one medially and 1 at the tip of the toe. Surrounding callus is also not back here. We've been using silver alginate. He has a darco forefoot off loader now on the right foot.his own shoe on the left foot. Would like to try a total contact cast on the right. He is making arrangements for  someone to drive him here in 2 weeks. 06/29/18; still a buildup of callus over the right first toe amputation site although not as significant as previously. The left first toe has 2 open wounds one medially and one at the tip surrounding callus as well. We've been using silver alginate. He comes in today with the right for a total contact cast which I'll use on the right 07/04/18 on evaluation today patient appears to be doing decently well in regard to the cast. Unfortunately he did have a little area that rubbed on the lateral aspect of his leg the cast actually cracked on the medial aspect this happened he states just yesterday. He has not been walking in the cast without the boot portion on. With that being said he states that he is unsure of exactly what happened with the crack. Nonetheless this does seem to have caused a little bit of rubbing on the lateral aspect I think due to the integrity of the cast being compromised. Nonetheless I advised him that if this happens in the future he needs to let us know soon as possible. Nonetheless I do feel like the cast has been of benefit some of the area on the distal portion of his great toe amputation site actually appears to be doing better. 07/12/18 on evaluation today patient appears to be doing very well in regard to his right great toe invitation site. This seems to be showing signs of excellent improvement which is great news. There does not appear to be any evidence of infection at this time. With that being said he has been tolerating the dressing changes without complication. There appears to be no evidence of infection and again this is barely open at this point. The total contact cast has been of excellent benefit for him. I believe this will likely be completely closed and ready next week that we can switch to a cast on the left foot. He states he may want to take a week's break. 07/19/18 on evaluation today patient appears to be doing better in  regard to both ulceration areas. The right foot actually appears to be completely healed he still has some callous but no open wound. The left foot/great toe actually seems to be better after last week's debridement overall the appearance of the wound is greatly improved. 08/09/18 on evaluation today patient actually appears to be  doing a little bit worse in regard to the right metatarsal invitation site. He's been tolerating the dressing changes without complication. With that being said he does note that the area actually cracked open as of today when he was in the shower trying to scrub it. Nonetheless it does appear that the Caryn Section that previously was open and draining has subsequently reopened and is draining again. Nonetheless as I was looking at him and performing the debridement today I did inquire about whether or not we had ever biopsy the area as I was concerned about the possibility for something along the lines of a work type virus/infection causing the excessive callous buildup. Especially since he seems to grow much more than what would just be traditionally expected by friction alone. He subsequently told me that he did have this biopsied somewhere around 1-2 years ago by Dr. Jacqualyn Posey and that it showed that he had a wart infection at that point. Nonetheless he was never referred to dermatology better Jacqualyn Posey attempted to cut it out according to the patient. Nonetheless that may be part of the big issue with what we're dealing with here and without treating the wart infection we may not actually get this to heal and stay closed. READMISSION 12/07/2018 The patient is back in clinic today essentially with wounds in the same area and condition is that I think the last 2 times he has been here. This includes the right first metatarsal head in the setting of a previous right great toe amputation and a large part of the medial part of his left great toe. The patient states he has been dealing  with this for years dating back to 2005 on and off. That he is never found this to be totally healed. When he was last in this clinic he had this biopsied shave biopsies which showed hyperkeratotic and parakeratotic debris. Epithelium with papillary features negative for malignancy. He has been using silver alginate. He has healing sandals which he uses Felt on the bottom of these. When he was here last time we put him a total contact cast at least for a while and the patient states on the right that helped with the buildup of hyperkeratotic tissue and that seems to be reflective in our notes. He also states that was previously biopsied by Dr. Jacqualyn Posey of podiatry. The patient has a severe idiopathic peripheral neuropathy which he attributes to a brown recluse spider bite in 2002 or so. He is not a diabetic The patient had arterial studies in this showed an ABI in the right of 1.05 on the left was 0.95. TBI's were not done. He had monophasic waveforms at the right posterior tibial biphasic at the dorsalis pedis. On the left he was biphasic at the posterior tibial and triphasic at the dorsalis pedis. 1/17; this is a patient with a very challenging issue was readmitted to the clinic last week. He has a large area over the first right MTP in the setting of a previous right great toe amputation. On presentation this is covered with copious amounts of hyperkeratotic thick callus nonviable tissue. When this is debridement both on this occasion and previous occasions in this clinic he has a very boggy presentation to the subcutaneous tissue which looks somewhat atypical. He has a similar area on the left great toe from the tip of the toe down the medial aspect beyond the interphalangeal joint. He's had previous MRIs of both of his feet. On the right that showed small abscesses  and he was taken to the OR by podiatry for debridement and cleaning out of the abscesses. An MRI did not show osteomyelitis of the left  first toe. Both the MRIs were done in 2018. He saw Dr. Vallarie Mare at the time who did not think he had significant macrovascular disease. He is not a diabetic but he has idiopathic peripheral neuropathy and was a significant smoker. When he was in the clinic earlier in 2019 he had biopsies done of both areas that showed hyperkeratotic material but no malignancy. X-rays I ordered last week did not show osteomyelitis on the left but did suggest osteomyelitis at the tip of the left great toe. I looked at the x-rays myself this is a subtle finding it is only seen on the lateral film. I did a very significant debridement on him last week bilaterally in both the wound areas look better. When he was previously in our clinic the area on the right did improve with a total contact cast. He dropped out of the schedule at that point I'm not exactly sure why 1/24; the biopsy I did of the area over his right first metatarsal head showed the possibility of verruca vulgaris. Superimposed changes of lichen simplex chronicus. If this is a plantar wart this would be extremely large. I think he would need topical destructive therapy and I am going to refer him to dermatology. There was no suggestion of malignancy. Previous biopsies in this clinic on both his areas did not show evidence of malignancy either. Until we get dermatology I am going to continue with silver alginate. X-rays suggested osteomyelitis of the left toe I am going to continue him on trimethoprim sulfamethoxazole 2/3; not much change in this area on the right first met head. Extensive dark eschar formation around the central area of nonviable subcutaneous tissue looking like a boggy subcutaneous tissue. Using a #10 scalpel and pickups I removed all of this. I have tried this before and this man but I plan to put him in a total contact cast next week ooUsing a #5 curette debridement of the left great toe ooHe has an appointment with dermatology at [facility] on  the 25th 2/11; right first metatarsal head after last week's extensive debridement is right back to the way it was with a nonviable subcutaneous tissue and thick surrounding eschar. The left great toe looks about the same. The patient could not have a total contact cast today because he had household problems [leaking roof] but I am not going to put a cast on him until I have information from the dermatologist about this. READMISSION 06/17/2020 Mr. Hurta is now a 64 year old man that we have had in this clinic on at least 2 occasions previously. When he was last here in February 2020 I referred him to dermatology at [facility] for their review of large wounds on the right first metatarsal head with surrounding severe chronic hyperkeratosis. This was biopsied on at least 2 occasions in our clinic and I think at [facility] as well that did not show an underlying malignancy. I have reviewed the consult from Dr. Sharlette Dense dermatology from 01/23/2019. At that time the patient had wounds on the plantar right first metatarsal head as well as the plantar left great toe. From what he is saying the left great toe healed however he recently had to have a left TMA by Dr. 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 Dermatology at [facility]  apparently referred him to Dr. Luetta Nutting of plastic surgery and an operative excision of this area was planned however apparently they ran into the pandemic. The procedure was repetitively cancel and the patient did not keep his final appointment which I think was earlier this year he was also seen in February of this year at the wound care center in [facility] by Dr. Zigmund Daniel. They were applying Silvadene cream and Xeroform which she is essentially doing now. I see in his notes that there was still some concern about underlying malignancy although would be difficult to believe as long as this is been there that there would not be more extensive damage  at this point in any case he is back in our clinic for review of this. He had an x-ray of the foot on 01/23/2020 that was negative for osteomyelitis acute fracture. Past medical history; idiopathic peripheral neuropathy, right great toe amputation in 2011, lumbar spinal stenosis, left transmetatarsal amputation on 05/30/2020 by Dr. Sharol Given and more recently apparently a slight wound dehiscence. He also had a fracture of his left tibial plateau sometime in 2020. ABI in our clinic was 0.95 8/9-Patient returns after being established in the clinic last time, he has a new open area on the plantar aspect of his right second toe, the right foot plantar ulcer below the great toe on the right looks slightly smaller, using PolyMem. Patient attends to his surgeon for the left foot wound 8/23; this is a patient with a refractory wound over his right first metatarsal head in the setting of severe surrounding hyperkeratosis. I had referred him to dermatology subsequently came to the attention of Dr. Vernona Rieger and Dr. Zigmund Daniel at [facility]. He also had a left TMA by Dr. Sharol Given on the left foot because of osteomyelitis I believe. He is in bilateral surgical shoes. 9/13; refractory wound over the right first metatarsal head with surrounding hyperkeratosis Verrucous skin. He had a left transmetatarsal amputation by Dr. Sharol Given. We have not been following this however this apparently is still not closed. I have looked over his notes from [facility]. This is well summarized by a note from Dr. Zigmund Daniel on February 24. The patient had had several biopsies of the skin of the first toe metatarsal head. As well as the left great toe which has since been amputated. The pathology results showed Stratus corneum with hyperkeratosis and parakeratosis. An underlying verricous carcinoma could not be excluded. I think the patient was given follow-up but I will actually think that he kept his appointments. He was sent back to follow-up with him with them again  in February 2021. Dr. Luetta Nutting who is the plastic surgeon recommended wound care rather than extensive debridement. I am very doubtful that this represents any form of malignancy. This patient has been dealing with this wound for years and I think we would have had a declaration of status well before this if this were any form of malignancy. I also do not believe he has an arterial issue. ABI in the clinic here was 0.95. He has easily palpable peripheral pulses. 10/4; generally gradually better wound area over the right plantar foot first metatarsal head.Marland Kitchen He still has thick surrounding callus around the wound margins and a fibrinous debris over the wound surface although in general a lot of this looks considerably better. He has been following with orthopedics for a left amputation by Dr. Sharol Given. We have not been following this 10/18; first metatarsal head wound is still open however there is less adherent  callus and thick skin around the edge of this. I think this largely has to do with aggressive debridement last time and I am going to repeat this today. He is still dealing with a wound on the left transmetatarsal amputation site this is being followed by Dr. Sharol Given. He has a surgical shoe on the left foot we have not been following this. He is using crutches apparently reliably to offload both his feet. 11/1 right first metatarsal head. Smaller surface area considerable amount of callus around this wound. This is a chronic issue. He has had a left transmetatarsal amputation followed by Dr. Sharol Given there is still wounds in this area he is followed with Dr. Sharol Given has a surgical shoe here 11/15; right first metatarsal head I think the wound is about the same substantial surrounding callus. This is a chronic issue. He apparently has been back to the OR with Dr. Sharol Given with regards to his left foot wound/amputation site he comes in today with a new surgical wound and a wound VAC with a full canister. The relevance  of this to [name] as I had really wanted to put this man in a total contact cast but I cannot do that as long as he is in a surgical boot on the left side 11/29; right first metatarsal head about the same. Substantial amount of surrounding callus. This is easily removed however a lot of bleeding of underlying tissue. This is been this way for a long time. Apparently he has had more surgery on the left foot we are not following this this is been followed by his surgeon Dr. Sharol Given. We have not looked at this we have been using polymen over the 12/13; right first metatarsal head some improvement. Aggressive debridement last time. We have been using polymen Ag. He has been back to see Dr. Sharol Given still with an open wound on the plantar left foot we have not been following this but I have been reassured by the patient that it is still open Objective Constitutional Patient is hypertensive.. Pulse regular and within target range for patient.Marland Kitchen Respirations regular, non-labored and within target range.. Temperature is normal and within the target range for the patient.Marland Kitchen Appears in no distress. Vitals Time Taken: 10:39 AM, Height: 80 in, Weight: 350 lbs, BMI: 38.4, Temperature: 98.0 F, Pulse: 78 bpm, Respiratory Rate: 18 breaths/min, Blood Pressure: 163/75 mmHg. Cardiovascular Does not appear to be an arterial issue. General Notes: Wound exam; right first metatarsal head. Previous right first toe amputation. Not as much thick callus as usual and the open area is not quite as large still some depth. This does not probe to deep structures muscle or bone. I elected not to go ahead and debride this today as it just does not seem to have made a measurable amount of difference for the amount of debridement that has to be done along with the cauterization. Integumentary (Hair, Skin) Wound #12 status is Open. Original cause of wound was Gradually Appeared. The wound is located on the Right Metatarsal head first. The wound  measures 3.3cm length x 1cm width x 0.8cm depth; 2.592cm^2 area and 2.073cm^3 volume. There is Fat Layer (Subcutaneous Tissue) exposed. There is no tunneling or undermining noted. There is a medium amount of serous drainage noted. The wound margin is thickened. There is medium (34-66%) red granulation within the wound bed. There is no necrotic tissue within the wound bed. Assessment Active Problems ICD-10 Non-pressure chronic ulcer of other part of right foot with other  specified severity Other idiopathic peripheral autonomic neuropathy Plan Follow-up Appointments: Return appointment in 1 month. Bathing/ Shower/ Hygiene: May shower with protection but do not get wound dressing(s) wet. Off-Loading: Open toe surgical shoe to: - right foot Home Health: No change in wound care orders this week; continue Home Health for wound care. May utilize formulary equivalent dressing for wound treatment orders unless otherwise specified. Alvis Lemmings WOUND #12: - Metatarsal head first Wound Laterality: Right Cleanser: Wound Cleanser (Home Health) Every Other Day/15 Days Discharge Instructions: Cleanse the wound with wound cleanser or normal saline prior to applying a clean dressing using gauze sponges, not tissue or cotton balls. Peri-Wound Care: Sween Lotion (Moisturizing lotion) (Home Health) Every Other Day/15 Days Discharge Instructions: Apply moisturizing lotion as directed Prim Dressing: PolyMem Silver Non-Adhesive Dressing, 4.25x4.25 in [facility]) Every Other Day/15 Days ary Discharge Instructions: Apply to wound bed as instructed Secondary Dressing: Woven Gauze Sponge, Non-Sterile 4x4 in [facility]) Every Other Day/15 Days Discharge Instructions: Apply over primary dressing as directed. Secondary Dressing: Felt 2.5 yds x 5.5 in [facility]) Every Other Day/15 Days Discharge Instructions: Apply felt or foam donut over primary dressing as directed. Secured With: [employer], 4.5x3.1  (in/yd) [facility]) Every Other Day/15 Days Discharge Instructions: Secure with Kerlix as directed. Secured With: Paper T ape, 2x10 (in/yd) (Home Health) Every Other Day/15 Days Discharge Instructions: Secure dressing with tape as directed. 1. I continue with the polymen Ag. Still with the gauze. 2. He has crutches and makes the best attempt he can to offload this area. I cannot apply a total contact cast on this foot and shift all the way to his surgical foot on the left. Remotely a total contact cast really help this area. The patient has been to see dermatology at [facility] plastic surgery he has had numerous biopsies. Although this is a very unusual looking area a formal diagnosis here and has been elusive 3. I will follow this again in a month Electronic Signature(s) Signed: 11/13/2020 8:09:40 AM By: [name] MD Entered By: [name] on 11/10/2020 11:23:52 -------------------------------------------------------------------------------- SuperBill Details Patient Name: Date of Service: [name], [name] 11/10/2020 Medical Record Number: [id number] Patient Account Number: [id number] Date of Birth/Sex: Treating RN: Jun 01, 1956 (64 y.o. [name] Primary Care Provider: [name] Other Clinician: Referring Provider: Treating Provider/Extender: [name] in Treatment: 20 Diagnosis Coding ICD-10 Codes Code Description [phone] Non-pressure chronic ulcer of other part of right foot with other specified severity G90.09 Other idiopathic peripheral autonomic neuropathy Facility Procedures CPT4 Code: [id number] Description: 99213 - WOUND CARE VISIT-LEV 3 EST PT Modifier: Quantity: 1 Physician Procedures Electronic Signature(s) Signed: 11/11/2020 5:24:10 PM By: Levan Hurst RN, BSN Signed: 11/13/2020 8:09:40 AM By: [name] MD Entered By: Levan Hurst on 11/11/2020 07:32:34

## 2020-11-13 NOTE — Telephone Encounter (Signed)
Patient called needing Rx refilled Oxycodone 5 mg Tab. The number to contact patient is [phone]

## 2020-11-13 NOTE — Progress Notes (Signed)
AMAAR, OSHITA ([id number]) Visit Report for 11/10/2020 Arrival Information Details Patient Name: Date of Service: [name], [name] 11/10/2020 10:30 A M Medical Record Number: [id number] Patient Account Number: [id number] Date of Birth/Sex: Treating RN: 03/23/1956 (64 y.o. [name] Primary Care Zarinah Oviatt: [name] Other Clinician: Referring Macall Mccroskey: Treating Woodie Trusty/Extender: [name] in Treatment: 20 Visit Information History Since Last Visit Added or deleted any medications: No Patient Arrived: Crutches Any new allergies or adverse reactions: No Arrival Time: 10:39 Had a fall or experienced change in No Accompanied By: self activities of daily living that may affect Transfer Assistance: None risk of falls: Patient Identification Verified: Yes Signs or symptoms of abuse/neglect since last visito No Secondary Verification Process Completed: Yes Hospitalized since last visit: No Patient Requires Transmission-Based Precautions: No Implantable device outside of the clinic excluding No Patient Has Alerts: No cellular tissue based products placed in the center since last visit: Has Dressing in Place as Prescribed: Yes Pain Present Now: No Electronic Signature(s) Signed: 11/13/2020 7:52:14 AM By: Sandre Kitty Entered By: Sandre Kitty on 11/10/2020 10:39:47 -------------------------------------------------------------------------------- Clinic Level of Care Assessment Details Patient Name: Date of Service: [name], [name] 11/10/2020 10:30 A M Medical Record Number: [id number] Patient Account Number: [id number] Date of Birth/Sex: Treating RN: 03-Dec-1955 (64 y.o. [name] Primary Care Ardelia Wrede: [name] Other Clinician: Referring Laron Angelini: Treating Lasalle Abee/Extender: [name] in Treatment: 20 Clinic Level of Care Assessment Items TOOL 4 Quantity Score X- 1 0 Use when  only an EandM is performed on FOLLOW-UP visit ASSESSMENTS - Nursing Assessment / Reassessment X- 1 10 Reassessment of Co-morbidities (includes updates in patient status) X- 1 5 Reassessment of Adherence to Treatment Plan ASSESSMENTS - Wound and Skin A ssessment / Reassessment X - Simple Wound Assessment / Reassessment - one wound 1 5 []  - 0 Complex Wound Assessment / Reassessment - multiple wounds []  - 0 Dermatologic / Skin Assessment (not related to wound area) ASSESSMENTS - Focused Assessment []  - 0 Circumferential Edema Measurements - multi extremities []  - 0 Nutritional Assessment / Counseling / Intervention X- 1 5 Lower Extremity Assessment (monofilament, tuning fork, pulses) []  - 0 Peripheral Arterial Disease Assessment (using hand held doppler) ASSESSMENTS - Ostomy and/or Continence Assessment and Care []  - 0 Incontinence Assessment and Management []  - 0 Ostomy Care Assessment and Management (repouching, etc.) PROCESS - Coordination of Care X - Simple Patient / Family Education for ongoing care 1 15 []  - 0 Complex (extensive) Patient / Family Education for ongoing care X- 1 10 Staff obtains Programmer, systems, Records, T Results / Process Orders est X- 1 10 Staff telephones HHA, Nursing Homes / Clarify orders / etc []  - 0 Routine Transfer to another Facility (non-emergent condition) []  - 0 Routine Hospital Admission (non-emergent condition) []  - 0 New Admissions / Biomedical engineer / Ordering NPWT Apligraf, etc. , []  - 0 Emergency Hospital Admission (emergent condition) X- 1 10 Simple Discharge Coordination []  - 0 Complex (extensive) Discharge Coordination PROCESS - Special Needs []  - 0 Pediatric / Minor Patient Management []  - 0 Isolation Patient Management []  - 0 Hearing / Language / Visual special needs []  - 0 Assessment of Community assistance (transportation, D/C planning, etc.) []  - 0 Additional assistance / Altered mentation []  - 0 Support  Surface(s) Assessment (bed, cushion, seat, etc.) INTERVENTIONS - Wound Cleansing / Measurement X - Simple Wound Cleansing - one wound 1 5 []  - 0 Complex Wound  Cleansing - multiple wounds X- 1 5 Wound Imaging (photographs - any number of wounds) $RemoveBe'[]'PgyjGYDGY$  - 0 Wound Tracing (instead of photographs) X- 1 5 Simple Wound Measurement - one wound $RemoveB'[]'vADbDNzi$  - 0 Complex Wound Measurement - multiple wounds INTERVENTIONS - Wound Dressings $RemoveBeforeD'[]'wPIITLyYSNxmzP$  - 0 Small Wound Dressing one or multiple wounds X- 1 15 Medium Wound Dressing one or multiple wounds $RemoveBeforeD'[]'ogIRREhoHavMra$  - 0 Large Wound Dressing one or multiple wounds X- 1 5 Application of Medications - topical $RemoveB'[]'HSjRiUzY$  - 0 Application of Medications - injection INTERVENTIONS - Miscellaneous $RemoveBeforeD'[]'UHsfNaVvLAEVFL$  - 0 External ear exam $Remove'[]'LlCoayn$  - 0 Specimen Collection (cultures, biopsies, blood, body fluids, etc.) $RemoveBefor'[]'vpoPzVMuNpjO$  - 0 Specimen(s) / Culture(s) sent or taken to Lab for analysis $RemoveBefo'[]'LkohUJMEtfF$  - 0 Patient Transfer (multiple staff / Civil Service fast streamer / Similar devices) $RemoveBeforeDE'[]'yDjEQImStOZWhmi$  - 0 Simple Staple / Suture removal (25 or less) $Remove'[]'qHRyZFR$  - 0 Complex Staple / Suture removal (26 or more) $Remove'[]'bKupTro$  - 0 Hypo / Hyperglycemic Management (close monitor of Blood Glucose) $RemoveBefore'[]'YMXowgPURrkEZ$  - 0 Ankle / Brachial Index (ABI) - do not check if billed separately X- 1 5 Vital Signs Has the patient been seen at the hospital within the last three years: Yes Total Score: 110 Level Of Care: New/Established - Level 3 Electronic Signature(s) Signed: 11/11/2020 5:24:10 PM By: Levan Hurst RN, BSN Entered By: Levan Hurst on 11/11/2020 07:32:28 -------------------------------------------------------------------------------- Encounter Discharge Information Details Patient Name: Date of Service: [name], [name] 11/10/2020 10:30 A M Medical Record Number: [id number] Patient Account Number: [id number] Date of Birth/Sex: Treating RN: 07-Mar-1956 (64 y.o. [name] Primary Care Lether Tesch: [name] Other Clinician: Referring Nyari Olsson: Treating  Megean Fabio/Extender: [name] in Treatment: 20 Encounter Discharge Information Items Discharge Condition: Stable Ambulatory Status: Crutches Discharge Destination: Home Transportation: Private Auto Accompanied By: self Schedule Follow-up Appointment: Yes Clinical Summary of Care: Electronic Signature(s) Signed: 11/11/2020 4:43:44 PM By: Deon Pilling Entered By: Deon Pilling on 11/10/2020 11:30:18 -------------------------------------------------------------------------------- Lower Extremity Assessment Details Patient Name: Date of Service: [name], [name] 11/10/2020 10:30 A M Medical Record Number: [id number] Patient Account Number: [id number] Date of Birth/Sex: Treating RN: 09-Feb-1956 (64 y.o. [name] Primary Care Anyae Griffith: [name] Other Clinician: Referring Clearance Chenault: Treating Lajune Perine/Extender: [name] in Treatment: 20 Edema Assessment Assessed: [Left: No] [Right: No] Edema: [Left: N] [Right: o] Calf Left: Right: Point of Measurement: From Medial Instep 45.5 cm Ankle Left: Right: Point of Measurement: From Medial Instep 26.4 cm Vascular Assessment Pulses: Dorsalis Pedis Palpable: [Right:Yes] Electronic Signature(s) Signed: 11/11/2020 4:55:24 PM By: [name] RN, BSN Entered By: [name] on 11/10/2020 10:51:24 -------------------------------------------------------------------------------- Multi Wound Chart Details Patient Name: Date of Service: [name], [name] 11/10/2020 10:30 A M Medical Record Number: [id number] Patient Account Number: [id number] Date of Birth/Sex: Treating RN: 10-02-1956 (64 y.o. [name] Primary Care Braden Cimo: [name] Other Clinician: Referring Regnald Bowens: Treating Delsin Copen/Extender: [name] in Treatment: 20 Vital Signs Height(in): 80 Pulse(bpm): 84 Weight(lbs): 350 Blood Pressure(mmHg):  163/75 Body Mass Index(BMI): 38 Temperature(F): 98.0 Respiratory Rate(breaths/min): 18 Photos: [12:No Photos Right Metatarsal head first] [N/A:N/A N/A] Wound Location: [12:Gradually Appeared] [N/A:N/A] Wounding Event: [12:Neuropathic Ulcer-Non Diabetic] [N/A:N/A] Primary Etiology: [12:Anemia, Chronic Obstructive] [N/A:N/A] Comorbid History: [12:Pulmonary Disease (COPD), Hypertension, Osteoarthritis, Osteomyelitis, Neuropathy 11/29/2018] [N/A:N/A] Date Acquired: [12:20] [N/A:N/A] Weeks of Treatment: [12:Open] [N/A:N/A] Wound Status: [12:3.3x1x0.8] [N/A:N/A] Measurements L x W x D (cm) [12:2.592] [N/A:N/A] A (cm) : rea [12:2.073] [N/A:N/A] Volume (cm) : [12:72.20%] [  N/A:N/A] % Reduction in Area: [12:-11.10%] [N/A:N/A] % Reduction in Volume: [12:Full Thickness Without Exposed] [N/A:N/A] Classification: [12:Support Structures Medium] [N/A:N/A] Exudate Amount: [12:Serous] [N/A:N/A] Exudate Type: [12:amber] [N/A:N/A] Exudate Color: [12:Thickened] [N/A:N/A] Wound Margin: [12:Medium (34-66%)] [N/A:N/A] Granulation Amount: [12:Red] [N/A:N/A] Granulation Quality: [12:None Present (0%)] [N/A:N/A] Necrotic Amount: [12:Fat Layer (Subcutaneous Tissue): Yes N/A] Exposed Structures: [12:Fascia: No Tendon: No Muscle: No Joint: No Bone: No None] [N/A:N/A] Treatment Notes Electronic Signature(s) Signed: 11/11/2020 5:24:10 PM By: Zandra Abts RN, BSN Signed: 11/13/2020 8:09:40 AM By: Baltazar Najjar MD Entered By: Baltazar Najjar on 11/10/2020 11:19:12 -------------------------------------------------------------------------------- Multi-Disciplinary Care Plan Details Patient Name: Date of Service: [name], [name] 11/10/2020 10:30 A M Medical Record Number: [id number] Patient Account Number: [id number] Date of Birth/Sex: Treating RN: [birth year] (64 y.o. Elizebeth Koller Primary Care Denni France: Alysia Penna Other Clinician: Referring Dailey Alberson: Treating Ronika Kelson/Extender: Jerl Mina in Treatment: 20 Active Inactive Wound/Skin Impairment Nursing Diagnoses: Impaired tissue integrity Knowledge deficit related to ulceration/compromised skin integrity Goals: Patient/caregiver will verbalize understanding of skin care regimen Date Initiated: 06/17/2020 Target Resolution Date: 11/28/2020 Goal Status: Active Ulcer/skin breakdown will have a volume reduction of 30% by week 4 Date Initiated: 06/17/2020 Date Inactivated: 07/21/2020 Target Resolution Date: 07/18/2020 Goal Status: Met Interventions: Assess patient/caregiver ability to obtain necessary supplies Assess patient/caregiver ability to perform ulcer/skin care regimen upon admission and as needed Assess ulceration(s) every visit Provide education on ulcer and skin care Notes: Electronic Signature(s) Signed: 11/11/2020 5:24:10 PM By: Zandra Abts RN, BSN Entered By: Zandra Abts on 11/11/2020 07:32:00 -------------------------------------------------------------------------------- Pain Assessment Details Patient Name: Date of Service: [name], [name] 11/10/2020 10:30 A M Medical Record Number: [id number] Patient Account Number: [id number] Date of Birth/Sex: Treating RN: [birth year] (64 y.o. Elizebeth Koller Primary Care Fred Hammes: Alysia Penna Other Clinician: Referring Maytal Mijangos: Treating Khy Pitre/Extender: Jerl Mina in Treatment: 20 Active Problems Location of Pain Severity and Description of Pain Patient Has Paino No Site Locations Pain Management and Medication Current Pain Management: Electronic Signature(s) Signed: 11/11/2020 5:24:10 PM By: Zandra Abts RN, BSN Signed: 11/13/2020 7:52:14 AM By: Karl Ito Entered By: Karl Ito on 11/10/2020 10:40:13 -------------------------------------------------------------------------------- Patient/Caregiver Education Details Patient Name: Date of Service: [name], CHA RLES  E. 12/13/2021andnbsp10:30 A M Medical Record Number: [id number] Patient Account Number: [id number] Date of Birth/Gender: Treating RN: [birth year] (64 y.o. Elizebeth Koller Primary Care Physician: Alysia Penna Other Clinician: Referring Physician: Treating Physician/Extender: Jerl Mina in Treatment: 20 Education Assessment Education Provided To: Patient Education Topics Provided Wound/Skin Impairment: Methods: Explain/Verbal Responses: State content correctly [employer]) Signed: 11/11/2020 5:24:10 PM By: Zandra Abts RN, BSN Entered By: Zandra Abts on 11/11/2020 07:32:07 -------------------------------------------------------------------------------- Wound Assessment Details Patient Name: Date of Service: [name], [name] 11/10/2020 10:30 A M Medical Record Number: [id number] Patient Account Number: [id number] Date of Birth/Sex: Treating RN: 1956/07/04 (64 y.o. Elizebeth Koller Primary Care Cayli Escajeda: Alysia Penna Other Clinician: Referring Rockwell Zentz: Treating Zainah Steven/Extender: Jerl Mina in Treatment: 20 Wound Status Wound Number: 12 Primary Neuropathic Ulcer-Non Diabetic Etiology: Wound Location: Right Metatarsal head first Wound Open Wounding Event: Gradually Appeared Status: Date Acquired: 11/29/2018 Comorbid Anemia, Chronic Obstructive Pulmonary Disease (COPD), Weeks Of Treatment: 20 History: Hypertension, Osteoarthritis, Osteomyelitis, Neuropathy Clustered Wound: No Photos Photo Uploaded By: Benjaman Kindler on 11/12/2020 14:34:20 Wound Measurements Length: (cm) 3.3 Width: (cm) 1 Depth: (cm) 0.8 Area: (cm) 2.592 Volume: (cm) 2.073 % Reduction in Area: 72.2% % Reduction  in Volume: -11.1% Epithelialization: None Tunneling: No Undermining: No Wound Description Classification: Full Thickness Without Exposed Support Structures Wound Margin: Thickened Exudate Amount:  Medium Exudate Type: Serous Exudate Color: amber Foul Odor After Cleansing: No Slough/Fibrino No Wound Bed Granulation Amount: Medium (34-66%) Exposed Structure Granulation Quality: Red Fascia Exposed: No Necrotic Amount: None Present (0%) Fat Layer (Subcutaneous Tissue) Exposed: Yes Tendon Exposed: No Muscle Exposed: No Joint Exposed: No Bone Exposed: No Treatment Notes Wound #12 (Metatarsal head first) Wound Laterality: Right Cleanser Wound Cleanser Discharge Instruction: Cleanse the wound with wound cleanser or normal saline prior to applying a clean dressing using gauze sponges, not tissue or cotton balls. Peri-Wound Care Sween Lotion (Moisturizing lotion) Discharge Instruction: Apply moisturizing lotion as directed Topical Primary Dressing PolyMem Silver Non-Adhesive Dressing, 4.25x4.25 in Discharge Instruction: Apply to wound bed as instructed Secondary Dressing Woven Gauze Sponge, Non-Sterile 4x4 in Discharge Instruction: Apply over primary dressing as directed. Felt 2.5 yds x 5.5 in Discharge Instruction: Apply felt or foam donut over primary dressing as directed. Secured With [employer], 4.5x3.1 (in/yd) Discharge Instruction: Secure with Kerlix as directed. Paper Tape, 2x10 (in/yd) Discharge Instruction: Secure dressing with tape as directed. Compression Wrap Compression Stockings Add-Ons Electronic Signature(s) Signed: 11/11/2020 4:55:24 PM By: [name] RN, BSN Signed: 11/11/2020 5:24:10 PM By: Levan Hurst RN, BSN Entered By: [name] on 11/10/2020 10:52:57 -------------------------------------------------------------------------------- Vitals Details Patient Name: Date of Service: [name], [name] 11/10/2020 10:30 A M Medical Record Number: [id number] Patient Account Number: [id number] Date of Birth/Sex: Treating RN: 1956/05/18 (64 y.o. [name] Primary Care Alandis Bluemel: [name] Other Clinician: Referring  Nelly Scriven: Treating Lenin Kuhnle/Extender: [name] in Treatment: 20 Vital Signs Time Taken: 10:39 Temperature (F): 98.0 Height (in): 80 Pulse (bpm): 78 Weight (lbs): 350 Respiratory Rate (breaths/min): 18 Body Mass Index (BMI): 38.4 Blood Pressure (mmHg): 163/75 Reference Range: 80 - 120 mg / dl Electronic Signature(s) Signed: 11/13/2020 7:52:14 AM By: Sandre Kitty Entered By: Sandre Kitty on 11/10/2020 10:40:06

## 2020-11-13 NOTE — Telephone Encounter (Signed)
done

## 2020-11-14 DIAGNOSIS — J449 Chronic obstructive pulmonary disease, unspecified: Secondary | ICD-10-CM | POA: Diagnosis not present

## 2020-11-14 DIAGNOSIS — T8781 Dehiscence of amputation stump: Secondary | ICD-10-CM | POA: Diagnosis not present

## 2020-11-14 DIAGNOSIS — G9009 Other idiopathic peripheral autonomic neuropathy: Secondary | ICD-10-CM | POA: Diagnosis not present

## 2020-11-14 DIAGNOSIS — K5792 Diverticulitis of intestine, part unspecified, without perforation or abscess without bleeding: Secondary | ICD-10-CM | POA: Diagnosis not present

## 2020-11-14 DIAGNOSIS — L97518 Non-pressure chronic ulcer of other part of right foot with other specified severity: Secondary | ICD-10-CM | POA: Diagnosis not present

## 2020-11-14 DIAGNOSIS — N189 Chronic kidney disease, unspecified: Secondary | ICD-10-CM | POA: Diagnosis not present

## 2020-11-14 DIAGNOSIS — T8744 Infection of amputation stump, left lower extremity: Secondary | ICD-10-CM | POA: Diagnosis not present

## 2020-11-14 DIAGNOSIS — I129 Hypertensive chronic kidney disease with stage 1 through stage 4 chronic kidney disease, or unspecified chronic kidney disease: Secondary | ICD-10-CM | POA: Diagnosis not present

## 2020-11-14 DIAGNOSIS — M86672 Other chronic osteomyelitis, left ankle and foot: Secondary | ICD-10-CM | POA: Diagnosis not present

## 2020-11-18 DIAGNOSIS — N189 Chronic kidney disease, unspecified: Secondary | ICD-10-CM | POA: Diagnosis not present

## 2020-11-18 DIAGNOSIS — I129 Hypertensive chronic kidney disease with stage 1 through stage 4 chronic kidney disease, or unspecified chronic kidney disease: Secondary | ICD-10-CM | POA: Diagnosis not present

## 2020-11-18 DIAGNOSIS — G9009 Other idiopathic peripheral autonomic neuropathy: Secondary | ICD-10-CM | POA: Diagnosis not present

## 2020-11-18 DIAGNOSIS — T8781 Dehiscence of amputation stump: Secondary | ICD-10-CM | POA: Diagnosis not present

## 2020-11-18 DIAGNOSIS — K5792 Diverticulitis of intestine, part unspecified, without perforation or abscess without bleeding: Secondary | ICD-10-CM | POA: Diagnosis not present

## 2020-11-18 DIAGNOSIS — J449 Chronic obstructive pulmonary disease, unspecified: Secondary | ICD-10-CM | POA: Diagnosis not present

## 2020-11-18 DIAGNOSIS — M86672 Other chronic osteomyelitis, left ankle and foot: Secondary | ICD-10-CM | POA: Diagnosis not present

## 2020-11-18 DIAGNOSIS — L97518 Non-pressure chronic ulcer of other part of right foot with other specified severity: Secondary | ICD-10-CM | POA: Diagnosis not present

## 2020-11-18 DIAGNOSIS — T8744 Infection of amputation stump, left lower extremity: Secondary | ICD-10-CM | POA: Diagnosis not present

## 2020-11-19 ENCOUNTER — Ambulatory Visit (INDEPENDENT_AMBULATORY_CARE_PROVIDER_SITE_OTHER): Payer: Medicare PPO | Admitting: Physician Assistant

## 2020-11-19 ENCOUNTER — Other Ambulatory Visit: Payer: Self-pay

## 2020-11-19 DIAGNOSIS — M86272 Subacute osteomyelitis, left ankle and foot: Secondary | ICD-10-CM

## 2020-11-19 MED ORDER — OXYCODONE HCL 5 MG PO TABS: 5.0000 mg | ORAL_TABLET | Freq: Four times a day (QID) | ORAL | 0 refills | Status: DC | PRN

## 2020-11-19 NOTE — Progress Notes (Signed)
Office Visit Note   Patient: [name].           Date of Birth: 1956/11/29           MRN: [id number] Visit Date: 11/19/2020              Requested by: [name], MD [address] [place],  [place] [address] PCP: [name], MD  Chief Complaint  Patient presents with  . Left Foot - Routine Post Op    10/08/20 revision left transmet amputation       HPI: Patient is 6 weeks status post revision left transmetatarsal amputation.  He is struggling with massive wound dehiscence.  He still continues to smoke but he states he is smoking only 4 cigarettes daily.  He realizes he will probably need a below-knee amputation but he is not ready for this yet.  He is on antibiotics.  Assessment & Plan: Visit Diagnoses: No diagnosis found.  Plan: Status post left transmetatarsal amputation revision with necrotic tissue and exposed bone.  Patient should follow-up in 2 weeks as he is not ready to commit to a below-knee amputation.  He understands if he has any increasing pain any increasing fever chills he is to go to the emergency room at [facility] Instructions: No follow-ups on file.   Ortho Exam  Patient is alert, oriented, no adenopathy, well-dressed, normal affect, normal respiratory effort. Examination of his left transmetatarsal amputation stump complete dehiscence.  On the plantar surface he has foul-smelling necrotic tissue.  Some of this was removed there was no bleeding.  There is some exposed bone.  No ascending cellulitis  Imaging: No results found. No images are attached to the encounter.  Labs: Lab Results  Component Value Date   HGBA1C 6.0 (H) 08/23/2017   HGBA1C 5.3 03/12/2014   HGBA1C 5.4 07/04/2012   ESRSEDRATE 14 06/14/2017   ESRSEDRATE 30 05/17/2017   ESRSEDRATE 23 (H) 01/31/2015   CRP 23.4 (H) 06/14/2017   CRP 37.4 (H) 05/17/2017   CRP 55.8 (H) 03/29/2017   REPTSTATUS 09/17/2010 FINAL 08/03/2010   GRAMSTAIN  08/02/2010    FEW  WBC PRESENT, PREDOMINANTLY PMN NO SQUAMOUS EPITHELIAL CELLS SEEN RARE GRAM NEGATIVE RODS   CULT NO ACID FAST BACILLI ISOLATED IN 6 WEEKS 08/03/2010     Lab Results  Component Value Date   ALBUMIN 4.0 03/12/2014   ALBUMIN 3.0 (L) 07/04/2012   ALBUMIN 1.7 (L) 08/02/2010   PREALBUMIN 4.3 (L) 08/02/2010    Lab Results  Component Value Date   MG 2.4 07/04/2012   MG 2.2 08/01/2010   No results found for: VD25OH  Lab Results  Component Value Date   PREALBUMIN 4.3 (L) 08/02/2010   CBC EXTENDED Latest Ref Rng & Units 10/08/2020 05/30/2020 01/02/2018  WBC 4.0 - 10.5 K/uL 8.7 9.0 12.4(H)  RBC 4.22 - 5.81 MIL/uL 4.84 4.67 4.16(L)  HGB 13.0 - 17.0 g/dL 14.4 13.6 12.5(L)  HCT 39.0 - 52.0 % 46.9 43.3 38.4(L)  PLT 150 - 400 K/uL 323 445(H) 223  NEUTROABS 1.4 - 7.0 x10E3/uL - - -  LYMPHSABS 0.7 - 3.1 x10E3/uL - - -     There is no height or weight on file to calculate BMI.  Orders:  No orders of the defined types were placed in this encounter.  No orders of the defined types were placed in this encounter.    Procedures: No procedures performed  Clinical Data: No additional findings.  ROS:  All  other systems negative, except as noted in the HPI. Review of Systems  Objective: Vital Signs: There were no vitals taken for this visit.  Specialty Comments:  No specialty comments available.  PMFS History: Patient Active Problem List   Diagnosis Date Noted  . Dehiscence of amputation stump ([facility])   . Abscess of left foot 05/30/2020  . Subacute osteomyelitis, left ankle and foot ([facility])   . Skin ulcers of both feet ([facility]) 10/24/2017  . Colovesical fistula   . Benign neoplasm of cecum   . Chronic venous insufficiency 07/20/2017  . Chronic left-sided low back pain without sciatica 11/08/2016  . Toe ulcer ([facility]) 03/08/2016  . Verruca 12/28/2015  . Pre-ulcerative calluses 12/28/2015  . History of adenomatous polyp of colon 06/09/2015  . Lipoma 03/13/2014  . Plantar warts  07/09/2012  . Cellulitis 07/04/2012  . Neuropathy 07/04/2012  . Hyperglycemia 07/04/2012  . HYPERTENSION, BENIGN 10/20/2010  . EDEMA 08/20/2010  . WISDOM TEETH EXTRACTION, HX OF 08/20/2010  . HYPOALBUMINEMIA 08/19/2010  . ANEMIA 08/19/2010  . NICOTINE ADDICTION 08/19/2010  . NECROTIZING PNEUMONIA 08/19/2010  . OSTEOARTHRITIS 08/19/2010   Past Medical History:  Diagnosis Date  . Arthritis   . Cancer ([facility])    kidney  . Chronic kidney disease    mass r kidney - partial nephrectomy  . COPD (chronic obstructive pulmonary disease) ([facility])   . Diverticulitis   . History of kidney stones   . Hx of adenomatous colonic polyps 06/09/2015  . Hyperlipidemia   . Hypertension    has previously been on medication but then taken off - 05/29/20 pt states he's never been treated for HTN  . Neuromuscular disorder ([facility])    neuropathy lower legs and feet  . Neuropathy    in both feet  . Pneumonia 2011    Family History  Problem Relation Age of Onset  . Heart disease Mother   . Esophageal cancer Mother   . Diabetes Father   . Pancreatic cancer Father   . Breast cancer Sister   . Colon cancer Neg Hx   . Rectal cancer Neg Hx   . Stomach cancer Neg Hx   . Colon polyps Neg Hx     Past Surgical History:  Procedure Laterality Date  . AMPUTATION Bilateral 06/05/2013   Procedure: RIGHT GREAT TOE AMPUTATION/LEFT GREAT TOE DEBRIDEMENT;  Surgeon: Wylene Simmer, MD;  Location: [facility];  Service: Orthopedics;  Laterality: Bilateral;  . AMPUTATION Left 05/30/2020   Procedure: LEFT TRANSMETATARSAL AMPUTATION;  Surgeon: Newt Minion, MD;  Location: [facility];  Service: Orthopedics;  Laterality: Left;  . APPLICATION OF WOUND VAC Left 05/30/2020   Procedure: APPLICATION OF WOUND VAC;  Surgeon: Newt Minion, MD;  Location: [facility];  Service: Orthopedics;  Laterality: Left;  . COLON SURGERY     robotic partial colectomy Dr. Marcello Moores 12-30-17  . COLONOSCOPY     x 2 last date 09/21/2017 bladder attached to colon eval  .  COLONOSCOPY WITH PROPOFOL N/A 09/21/2017   Procedure: COLONOSCOPY WITH PROPOFOL;  Surgeon: [name], MD;  Location: [facility];  Service: Endoscopy;  Laterality: N/A;  . MOUTH SURGERY    . PARTIAL NEPHRECTOMY     right Dr. Leighton Ruff [phone]  . ROBOTIC ASSITED PARTIAL NEPHRECTOMY Right 12/30/2017   Procedure: XI ROBOTIC ASSITED RIGHT PARTIAL NEPHRECTOMY;  Surgeon: Alexis Frock, MD;  Location: [facility];  Service: Urology;  Laterality: Right;  . STUMP REVISION Left 10/08/2020   Procedure: REVISION LEFT TRANSMETATARSAL AMPUTATION;  Surgeon: Newt Minion, MD;  Location: [facility];  Service: Orthopedics;  Laterality: Left;  . TOE AMPUTATION Right 06/05/2013   RIGHT GREAT TOE PARTIAL AMPUTATION    . TOOTH EXTRACTION  2011   multiple teeth extracted   Social History   Occupational History  . Occupation: retired  Tobacco Use  . Smoking status: Current Some Day Smoker    Packs/day: 0.20    Years: 43.00    Pack years: 8.60    Types: Cigarettes    Start date: 11/29/1973  . Smokeless tobacco: Never Used  . Tobacco comment: occasional smoker - ~1 pack/week. smoked ~1.5ppd until 5 yr ago  Vaping Use  . Vaping Use: Never used  Substance and Sexual Activity  . Alcohol use: No    Alcohol/week: 0.0 standard drinks  . Drug use: No  . Sexual activity: Not Currently    Partners: Female

## 2020-11-27 DIAGNOSIS — J449 Chronic obstructive pulmonary disease, unspecified: Secondary | ICD-10-CM | POA: Diagnosis not present

## 2020-11-27 DIAGNOSIS — T8781 Dehiscence of amputation stump: Secondary | ICD-10-CM | POA: Diagnosis not present

## 2020-11-27 DIAGNOSIS — I129 Hypertensive chronic kidney disease with stage 1 through stage 4 chronic kidney disease, or unspecified chronic kidney disease: Secondary | ICD-10-CM | POA: Diagnosis not present

## 2020-11-27 DIAGNOSIS — K5792 Diverticulitis of intestine, part unspecified, without perforation or abscess without bleeding: Secondary | ICD-10-CM | POA: Diagnosis not present

## 2020-11-27 DIAGNOSIS — N189 Chronic kidney disease, unspecified: Secondary | ICD-10-CM | POA: Diagnosis not present

## 2020-11-27 DIAGNOSIS — M86672 Other chronic osteomyelitis, left ankle and foot: Secondary | ICD-10-CM | POA: Diagnosis not present

## 2020-11-27 DIAGNOSIS — T8744 Infection of amputation stump, left lower extremity: Secondary | ICD-10-CM | POA: Diagnosis not present

## 2020-11-27 DIAGNOSIS — L97518 Non-pressure chronic ulcer of other part of right foot with other specified severity: Secondary | ICD-10-CM | POA: Diagnosis not present

## 2020-11-27 DIAGNOSIS — G9009 Other idiopathic peripheral autonomic neuropathy: Secondary | ICD-10-CM | POA: Diagnosis not present

## 2020-12-02 DIAGNOSIS — K5792 Diverticulitis of intestine, part unspecified, without perforation or abscess without bleeding: Secondary | ICD-10-CM | POA: Diagnosis not present

## 2020-12-02 DIAGNOSIS — T8744 Infection of amputation stump, left lower extremity: Secondary | ICD-10-CM | POA: Diagnosis not present

## 2020-12-02 DIAGNOSIS — G9009 Other idiopathic peripheral autonomic neuropathy: Secondary | ICD-10-CM | POA: Diagnosis not present

## 2020-12-02 DIAGNOSIS — L97518 Non-pressure chronic ulcer of other part of right foot with other specified severity: Secondary | ICD-10-CM | POA: Diagnosis not present

## 2020-12-02 DIAGNOSIS — T8781 Dehiscence of amputation stump: Secondary | ICD-10-CM | POA: Diagnosis not present

## 2020-12-02 DIAGNOSIS — J449 Chronic obstructive pulmonary disease, unspecified: Secondary | ICD-10-CM | POA: Diagnosis not present

## 2020-12-02 DIAGNOSIS — N189 Chronic kidney disease, unspecified: Secondary | ICD-10-CM | POA: Diagnosis not present

## 2020-12-02 DIAGNOSIS — M86672 Other chronic osteomyelitis, left ankle and foot: Secondary | ICD-10-CM | POA: Diagnosis not present

## 2020-12-02 DIAGNOSIS — I129 Hypertensive chronic kidney disease with stage 1 through stage 4 chronic kidney disease, or unspecified chronic kidney disease: Secondary | ICD-10-CM | POA: Diagnosis not present

## 2020-12-04 ENCOUNTER — Ambulatory Visit (INDEPENDENT_AMBULATORY_CARE_PROVIDER_SITE_OTHER): Payer: Medicare PPO | Admitting: Physician Assistant

## 2020-12-04 ENCOUNTER — Other Ambulatory Visit: Payer: Self-pay

## 2020-12-04 ENCOUNTER — Encounter: Payer: Self-pay | Admitting: Orthopedic Surgery

## 2020-12-04 DIAGNOSIS — M86272 Subacute osteomyelitis, left ankle and foot: Secondary | ICD-10-CM

## 2020-12-04 MED ORDER — DOXYCYCLINE HYCLATE 100 MG PO TABS: 100.0000 mg | ORAL_TABLET | Freq: Two times a day (BID) | ORAL | 0 refills | Status: DC

## 2020-12-04 MED ORDER — OXYCODONE HCL 5 MG PO TABS: 5.0000 mg | ORAL_TABLET | Freq: Four times a day (QID) | ORAL | 0 refills | Status: DC | PRN

## 2020-12-04 NOTE — Progress Notes (Signed)
Office Visit Note   Patient: [name].           Date of Birth: 1956/06/09           MRN: [id number] Visit Date: 12/04/2020              Requested by: [name], MD [address] [place],  [place] [address] PCP: [name], MD  Chief Complaint  Patient presents with  . Left Foot - Routine Post Op    10/08/20 revision left transmet amputation       HPI: This is a pleasant 65 year old gentleman who is almost 2 months status post revision left transmetatarsal amputation.  He has had progressive wound breakdown and dehiscence.  He has had some exposed bone on the plantar surface of his foot.  He has failed measures to improve healing including immobilization and using of a nitroglycerin patch.  He realizes himself that he "my foot has to go "but is trying to work on making his home handicap accessible  Assessment & Plan: Visit Diagnoses: No diagnosis found.  Plan: Patient was seen directly by Dr. Sharol Given.  He has explained to the patient that it would be diligent to do surgery as soon as possible.  Patient has agreed to go with surgery in the next 2 weeks.  We will check him back in 1 week.  We will refill his antibiotics and his pain medication.  He understands if he starts developing any systemic symptoms such as fever chills malaise he is to go to the emergency room or call us immediately  Follow-Up Instructions: No follow-ups on file.   Ortho Exam  Patient is alert, oriented, no adenopathy, well-dressed, normal affect, normal respiratory effort. Left foot nitroglycerin patch is in place.  He has had gross dehiscence of his revision left transmetatarsal amputation with progressive necrosis on the plantar aspect of his foot.  There is associated foul odor.  No ascending cellulitis. Heart RRR Lungs Clear  Imaging: No results found. No images are attached to the encounter.  Labs: Lab Results  Component Value Date   HGBA1C 6.0 (H) 08/23/2017   HGBA1C 5.3  03/12/2014   HGBA1C 5.4 07/04/2012   ESRSEDRATE 14 06/14/2017   ESRSEDRATE 30 05/17/2017   ESRSEDRATE 23 (H) 01/31/2015   CRP 23.4 (H) 06/14/2017   CRP 37.4 (H) 05/17/2017   CRP 55.8 (H) 03/29/2017   REPTSTATUS 09/17/2010 FINAL 08/03/2010   GRAMSTAIN  08/02/2010    FEW WBC PRESENT, PREDOMINANTLY PMN NO SQUAMOUS EPITHELIAL CELLS SEEN RARE GRAM NEGATIVE RODS   CULT NO ACID FAST BACILLI ISOLATED IN 6 WEEKS 08/03/2010     Lab Results  Component Value Date   ALBUMIN 4.0 03/12/2014   ALBUMIN 3.0 (L) 07/04/2012   ALBUMIN 1.7 (L) 08/02/2010   PREALBUMIN 4.3 (L) 08/02/2010    Lab Results  Component Value Date   MG 2.4 07/04/2012   MG 2.2 08/01/2010   No results found for: VD25OH  Lab Results  Component Value Date   PREALBUMIN 4.3 (L) 08/02/2010   CBC EXTENDED Latest Ref Rng & Units 10/08/2020 05/30/2020 01/02/2018  WBC 4.0 - 10.5 K/uL 8.7 9.0 12.4(H)  RBC 4.22 - 5.81 MIL/uL 4.84 4.67 4.16(L)  HGB 13.0 - 17.0 g/dL 14.4 13.6 12.5(L)  HCT 39.0 - 52.0 % 46.9 43.3 38.4(L)  PLT 150 - 400 K/uL 323 445(H) 223  NEUTROABS 1.4 - 7.0 x10E3/uL - - -  LYMPHSABS 0.7 - 3.1 x10E3/uL - - -  There is no height or weight on file to calculate BMI.  Orders:  No orders of the defined types were placed in this encounter.  No orders of the defined types were placed in this encounter.    Procedures: No procedures performed  Clinical Data: No additional findings.  ROS:  All other systems negative, except as noted in the HPI. Review of Systems  Objective: Vital Signs: There were no vitals taken for this visit.  Specialty Comments:  No specialty comments available.  PMFS History: Patient Active Problem List   Diagnosis Date Noted  . Dehiscence of amputation stump ([place])   . Abscess of left foot 05/30/2020  . Subacute osteomyelitis, left ankle and foot ([place])   . Skin ulcers of both feet ([place]) 10/24/2017  . Colovesical fistula   . Benign neoplasm of cecum   . Chronic venous  insufficiency 07/20/2017  . Chronic left-sided low back pain without sciatica 11/08/2016  . Toe ulcer ([place]) 03/08/2016  . Verruca 12/28/2015  . Pre-ulcerative calluses 12/28/2015  . History of adenomatous polyp of colon 06/09/2015  . Lipoma 03/13/2014  . Plantar warts 07/09/2012  . Cellulitis 07/04/2012  . Neuropathy 07/04/2012  . Hyperglycemia 07/04/2012  . HYPERTENSION, BENIGN 10/20/2010  . EDEMA 08/20/2010  . WISDOM TEETH EXTRACTION, HX OF 08/20/2010  . HYPOALBUMINEMIA 08/19/2010  . ANEMIA 08/19/2010  . NICOTINE ADDICTION 08/19/2010  . NECROTIZING PNEUMONIA 08/19/2010  . OSTEOARTHRITIS 08/19/2010   Past Medical History:  Diagnosis Date  . Arthritis   . Cancer ([facility])    kidney  . Chronic kidney disease    mass r kidney - partial nephrectomy  . COPD (chronic obstructive pulmonary disease) ([facility])   . Diverticulitis   . History of kidney stones   . Hx of adenomatous colonic polyps 06/09/2015  . Hyperlipidemia   . Hypertension    has previously been on medication but then taken off - 05/29/20 pt states he's never been treated for HTN  . Neuromuscular disorder ([facility])    neuropathy lower legs and feet  . Neuropathy    in both feet  . Pneumonia 2011    Family History  Problem Relation Age of Onset  . Heart disease Mother   . Esophageal cancer Mother   . Diabetes Father   . Pancreatic cancer Father   . Breast cancer Sister   . Colon cancer Neg Hx   . Rectal cancer Neg Hx   . Stomach cancer Neg Hx   . Colon polyps Neg Hx     Past Surgical History:  Procedure Laterality Date  . AMPUTATION Bilateral 06/05/2013   Procedure: RIGHT GREAT TOE AMPUTATION/LEFT GREAT TOE DEBRIDEMENT;  Surgeon: Wylene Simmer, MD;  Location: [facility];  Service: Orthopedics;  Laterality: Bilateral;  . AMPUTATION Left 05/30/2020   Procedure: LEFT TRANSMETATARSAL AMPUTATION;  Surgeon: Newt Minion, MD;  Location: [facility];  Service: Orthopedics;  Laterality: Left;  . APPLICATION OF WOUND VAC Left 05/30/2020    Procedure: APPLICATION OF WOUND VAC;  Surgeon: Newt Minion, MD;  Location: [facility];  Service: Orthopedics;  Laterality: Left;  . COLON SURGERY     robotic partial colectomy Dr. Marcello Moores 12-30-17  . COLONOSCOPY     x 2 last date 09/21/2017 bladder attached to colon eval  . COLONOSCOPY WITH PROPOFOL N/A 09/21/2017   Procedure: COLONOSCOPY WITH PROPOFOL;  Surgeon: [name], MD;  Location: [facility];  Service: Endoscopy;  Laterality: N/A;  . MOUTH SURGERY    . PARTIAL NEPHRECTOMY  right Dr. Romie Levee 12-30-17  . ROBOTIC ASSITED PARTIAL NEPHRECTOMY Right 12/30/2017   Procedure: XI ROBOTIC ASSITED RIGHT PARTIAL NEPHRECTOMY;  Surgeon: Sebastian Ache, MD;  Location: [facility];  Service: Urology;  Laterality: Right;  . STUMP REVISION Left 10/08/2020   Procedure: REVISION LEFT TRANSMETATARSAL AMPUTATION;  Surgeon: Nadara Mustard, MD;  [facility];  Service: Orthopedics;  Laterality: Left;  . TOE AMPUTATION Right 06/05/2013   RIGHT GREAT TOE PARTIAL AMPUTATION    . TOOTH EXTRACTION  2011   multiple teeth extracted   Social History   Occupational History  . Occupation: retired  Tobacco Use  . Smoking status: Current Some Day Smoker    Packs/day: 0.20    Years: 43.00    Pack years: 8.60    Types: Cigarettes    Start date: 11/29/1973  . Smokeless tobacco: Never Used  . Tobacco comment: occasional smoker - ~1 pack/week. smoked ~1.5ppd until 5 yr ago  Vaping Use  . Vaping Use: Never used  Substance and Sexual Activity  . Alcohol use: No    Alcohol/week: 0.0 standard drinks  . Drug use: No  . Sexual activity: Not Currently    Partners: Female

## 2020-12-05 DIAGNOSIS — I10 Essential (primary) hypertension: Secondary | ICD-10-CM | POA: Diagnosis not present

## 2020-12-05 DIAGNOSIS — J449 Chronic obstructive pulmonary disease, unspecified: Secondary | ICD-10-CM | POA: Diagnosis not present

## 2020-12-05 DIAGNOSIS — M86672 Other chronic osteomyelitis, left ankle and foot: Secondary | ICD-10-CM | POA: Diagnosis not present

## 2020-12-05 DIAGNOSIS — Z6838 Body mass index (BMI) 38.0-38.9, adult: Secondary | ICD-10-CM | POA: Diagnosis not present

## 2020-12-05 DIAGNOSIS — E1169 Type 2 diabetes mellitus with other specified complication: Secondary | ICD-10-CM | POA: Diagnosis not present

## 2020-12-05 DIAGNOSIS — K5792 Diverticulitis of intestine, part unspecified, without perforation or abscess without bleeding: Secondary | ICD-10-CM | POA: Diagnosis not present

## 2020-12-05 DIAGNOSIS — N401 Enlarged prostate with lower urinary tract symptoms: Secondary | ICD-10-CM | POA: Diagnosis not present

## 2020-12-05 DIAGNOSIS — T8744 Infection of amputation stump, left lower extremity: Secondary | ICD-10-CM | POA: Diagnosis not present

## 2020-12-05 DIAGNOSIS — I70235 Atherosclerosis of native arteries of right leg with ulceration of other part of foot: Secondary | ICD-10-CM | POA: Diagnosis not present

## 2020-12-05 DIAGNOSIS — I129 Hypertensive chronic kidney disease with stage 1 through stage 4 chronic kidney disease, or unspecified chronic kidney disease: Secondary | ICD-10-CM | POA: Diagnosis not present

## 2020-12-05 DIAGNOSIS — G9009 Other idiopathic peripheral autonomic neuropathy: Secondary | ICD-10-CM | POA: Diagnosis not present

## 2020-12-05 DIAGNOSIS — T8781 Dehiscence of amputation stump: Secondary | ICD-10-CM | POA: Diagnosis not present

## 2020-12-05 DIAGNOSIS — L97518 Non-pressure chronic ulcer of other part of right foot with other specified severity: Secondary | ICD-10-CM | POA: Diagnosis not present

## 2020-12-05 DIAGNOSIS — N189 Chronic kidney disease, unspecified: Secondary | ICD-10-CM | POA: Diagnosis not present

## 2020-12-05 DIAGNOSIS — M869 Osteomyelitis, unspecified: Secondary | ICD-10-CM | POA: Diagnosis not present

## 2020-12-08 ENCOUNTER — Telehealth: Payer: Self-pay | Admitting: Orthopedic Surgery

## 2020-12-08 ENCOUNTER — Encounter (HOSPITAL_BASED_OUTPATIENT_CLINIC_OR_DEPARTMENT_OTHER): Payer: Medicare PPO | Admitting: Internal Medicine

## 2020-12-08 NOTE — Telephone Encounter (Signed)
Patient would like to schedule appt for Friday. (100%). Last ov note says one week. Patients callback [phone]. Thank you

## 2020-12-08 NOTE — Telephone Encounter (Signed)
Received call from patient. Wants records faxed to [facility] for 2nd Op. I accepted verbal authorization and records 08/29/2020- present faxed [phone]

## 2020-12-08 NOTE — Telephone Encounter (Signed)
appt made

## 2020-12-08 NOTE — Telephone Encounter (Signed)
Can you please call pt and make an appt for this Friday? I will open an appt at 1pm. Thanks!

## 2020-12-10 DIAGNOSIS — T8744 Infection of amputation stump, left lower extremity: Secondary | ICD-10-CM | POA: Diagnosis not present

## 2020-12-10 DIAGNOSIS — T8781 Dehiscence of amputation stump: Secondary | ICD-10-CM | POA: Diagnosis not present

## 2020-12-10 DIAGNOSIS — N189 Chronic kidney disease, unspecified: Secondary | ICD-10-CM | POA: Diagnosis not present

## 2020-12-10 DIAGNOSIS — K5792 Diverticulitis of intestine, part unspecified, without perforation or abscess without bleeding: Secondary | ICD-10-CM | POA: Diagnosis not present

## 2020-12-10 DIAGNOSIS — L97518 Non-pressure chronic ulcer of other part of right foot with other specified severity: Secondary | ICD-10-CM | POA: Diagnosis not present

## 2020-12-10 DIAGNOSIS — J449 Chronic obstructive pulmonary disease, unspecified: Secondary | ICD-10-CM | POA: Diagnosis not present

## 2020-12-10 DIAGNOSIS — I129 Hypertensive chronic kidney disease with stage 1 through stage 4 chronic kidney disease, or unspecified chronic kidney disease: Secondary | ICD-10-CM | POA: Diagnosis not present

## 2020-12-10 DIAGNOSIS — M86672 Other chronic osteomyelitis, left ankle and foot: Secondary | ICD-10-CM | POA: Diagnosis not present

## 2020-12-10 DIAGNOSIS — G9009 Other idiopathic peripheral autonomic neuropathy: Secondary | ICD-10-CM | POA: Diagnosis not present

## 2020-12-12 ENCOUNTER — Encounter (HOSPITAL_BASED_OUTPATIENT_CLINIC_OR_DEPARTMENT_OTHER): Payer: Medicare PPO | Admitting: Internal Medicine

## 2020-12-12 ENCOUNTER — Ambulatory Visit (INDEPENDENT_AMBULATORY_CARE_PROVIDER_SITE_OTHER): Payer: Medicare PPO | Admitting: Physician Assistant

## 2020-12-12 ENCOUNTER — Encounter: Payer: Self-pay | Admitting: Physician Assistant

## 2020-12-12 ENCOUNTER — Other Ambulatory Visit: Payer: Self-pay

## 2020-12-12 DIAGNOSIS — M8638 Chronic multifocal osteomyelitis, other site: Secondary | ICD-10-CM | POA: Diagnosis not present

## 2020-12-12 DIAGNOSIS — L97509 Non-pressure chronic ulcer of other part of unspecified foot with unspecified severity: Secondary | ICD-10-CM | POA: Diagnosis not present

## 2020-12-12 DIAGNOSIS — M86272 Subacute osteomyelitis, left ankle and foot: Secondary | ICD-10-CM

## 2020-12-12 DIAGNOSIS — M79672 Pain in left foot: Secondary | ICD-10-CM | POA: Diagnosis not present

## 2020-12-12 MED ORDER — OXYCODONE HCL 5 MG PO TABS: 5.0000 mg | ORAL_TABLET | Freq: Four times a day (QID) | ORAL | 0 refills | Status: DC | PRN

## 2020-12-12 NOTE — Progress Notes (Signed)
Office Visit Note   Patient: [name].           Date of Birth: November 17, 1956           MRN: [id number] Visit Date: 12/12/2020              Requested by: [name], MD [address] [place],  [place] [address] PCP: [name], MD  No chief complaint on file.     HPI: Patient is a pleasant 65 year old who with dehiscence of his left transmetatarsal amputation stump with exposed bone and necrosis.  Last week Dr. Sharol Given had recommended a below-knee amputation.  Patient is not sure when he can do this and Dr. Sharol Given said that he runs a higher risk of infection and further amputation the longer he waits.  Recommended having amputation within the next couple weeks.  Patient did obtain a second opinion with [name] they have recommended as well a left transtibial amputation  Assessment & Plan: Visit Diagnoses: No diagnosis found.  Plan: We will continue to check him weekly.  He says he cannot have surgery before February 4.  He understands he runs a higher risk of infection and further limb loss.  He does understand if he has any increasing symptoms and were not available he is to go to the nearest emergency room  Follow-Up Instructions: No follow-ups on file.   Ortho Exam  Patient is alert, oriented, no adenopathy, well-dressed, normal affect, normal respiratory effort. Left transmetatarsal amputation stump with gross dehiscence exposed bone and necrosis.  Some foul odor.  No ascending cellulitis  Imaging: No results found. No images are attached to the encounter.  Labs: Lab Results  Component Value Date   HGBA1C 6.0 (H) 08/23/2017   HGBA1C 5.3 03/12/2014   HGBA1C 5.4 07/04/2012   ESRSEDRATE 14 06/14/2017   ESRSEDRATE 30 05/17/2017   ESRSEDRATE 23 (H) 01/31/2015   CRP 23.4 (H) 06/14/2017   CRP 37.4 (H) 05/17/2017   CRP 55.8 (H) 03/29/2017   REPTSTATUS 09/17/2010 FINAL 08/03/2010   GRAMSTAIN  08/02/2010    FEW WBC PRESENT, PREDOMINANTLY PMN NO SQUAMOUS  EPITHELIAL CELLS SEEN RARE GRAM NEGATIVE RODS   CULT NO ACID FAST BACILLI ISOLATED IN 6 WEEKS 08/03/2010     Lab Results  Component Value Date   ALBUMIN 4.0 03/12/2014   ALBUMIN 3.0 (L) 07/04/2012   ALBUMIN 1.7 (L) 08/02/2010   PREALBUMIN 4.3 (L) 08/02/2010    Lab Results  Component Value Date   MG 2.4 07/04/2012   MG 2.2 08/01/2010   No results found for: VD25OH  Lab Results  Component Value Date   PREALBUMIN 4.3 (L) 08/02/2010   CBC EXTENDED Latest Ref Rng & Units 10/08/2020 05/30/2020 01/02/2018  WBC 4.0 - 10.5 K/uL 8.7 9.0 12.4(H)  RBC 4.22 - 5.81 MIL/uL 4.84 4.67 4.16(L)  HGB 13.0 - 17.0 g/dL 14.4 13.6 12.5(L)  HCT 39.0 - 52.0 % 46.9 43.3 38.4(L)  PLT 150 - 400 K/uL 323 445(H) 223  NEUTROABS 1.4 - 7.0 x10E3/uL - - -  LYMPHSABS 0.7 - 3.1 x10E3/uL - - -     There is no height or weight on file to calculate BMI.  Orders:  No orders of the defined types were placed in this encounter.  No orders of the defined types were placed in this encounter.    Procedures: No procedures performed  Clinical Data: No additional findings.  ROS:  All other systems negative, except as noted in the HPI. Review  of Systems  Objective: Vital Signs: There were no vitals taken for this visit.  Specialty Comments:  No specialty comments available.  PMFS History: Patient Active Problem List   Diagnosis Date Noted  . Dehiscence of amputation stump ([place])   . Abscess of left foot 05/30/2020  . Subacute osteomyelitis, left ankle and foot ([place])   . Skin ulcers of both feet ([place]) 10/24/2017  . Colovesical fistula   . Benign neoplasm of cecum   . Chronic venous insufficiency 07/20/2017  . Chronic left-sided low back pain without sciatica 11/08/2016  . Toe ulcer ([place]) 03/08/2016  . Verruca 12/28/2015  . Pre-ulcerative calluses 12/28/2015  . History of adenomatous polyp of colon 06/09/2015  . Lipoma 03/13/2014  . Plantar warts 07/09/2012  . Cellulitis 07/04/2012  . Neuropathy  07/04/2012  . Hyperglycemia 07/04/2012  . HYPERTENSION, BENIGN 10/20/2010  . EDEMA 08/20/2010  . WISDOM TEETH EXTRACTION, HX OF 08/20/2010  . HYPOALBUMINEMIA 08/19/2010  . ANEMIA 08/19/2010  . NICOTINE ADDICTION 08/19/2010  . NECROTIZING PNEUMONIA 08/19/2010  . OSTEOARTHRITIS 08/19/2010   Past Medical History:  Diagnosis Date  . Arthritis   . Cancer ([facility])    kidney  . Chronic kidney disease    mass r kidney - partial nephrectomy  . COPD (chronic obstructive pulmonary disease) ([place])   . Diverticulitis   . History of kidney stones   . Hx of adenomatous colonic polyps 06/09/2015  . Hyperlipidemia   . Hypertension    has previously been on medication but then taken off - 05/29/20 pt states he's never been treated for HTN  . Neuromuscular disorder (H[facility])    neuropathy lower legs and feet  . Neuropathy    in both feet  . Pneumonia 2011    Family History  Problem Relation Age of Onset  . Heart disease Mother   . Esophageal cancer Mother   . Diabetes Father   . Pancreatic cancer Father   . Breast cancer Sister   . Colon cancer Neg Hx   . Rectal cancer Neg Hx   . Stomach cancer Neg Hx   . Colon polyps Neg Hx     Past Surgical History:  Procedure Laterality Date  . AMPUTATION Bilateral 06/05/2013   Procedure: RIGHT GREAT TOE AMPUTATION/LEFT GREAT TOE DEBRIDEMENT;  Surgeon: Wylene Simmer, MD;  Location: [facility];  Service: Orthopedics;  Laterality: Bilateral;  . AMPUTATION Left 05/30/2020   Procedure: LEFT TRANSMETATARSAL AMPUTATION;  Surgeon: Newt Minion, MD;  Location: [facility];  Service: Orthopedics;  Laterality: Left;  . APPLICATION OF WOUND VAC Left 05/30/2020   Procedure: APPLICATION OF WOUND VAC;  Surgeon: Newt Minion, MD;  Location: [facility];  Service: Orthopedics;  Laterality: Left;  . COLON SURGERY     robotic partial colectomy Dr. Marcello Moores 12-30-17  . COLONOSCOPY     x 2 last date 09/21/2017 bladder attached to colon eval  . COLONOSCOPY WITH PROPOFOL N/A 09/21/2017   Procedure:  COLONOSCOPY WITH PROPOFOL;  Surgeon: [name], MD;  [facility];  Service: Endoscopy;  Laterality: N/A;  . MOUTH SURGERY    . PARTIAL NEPHRECTOMY     right Dr. Leighton Ruff 06-01-24  . ROBOTIC ASSITED PARTIAL NEPHRECTOMY Right 12/30/2017   Procedure: XI ROBOTIC ASSITED RIGHT PARTIAL NEPHRECTOMY;  Surgeon: Alexis Frock, MD;  [facility];  Service: Urology;  Laterality: Right;  . STUMP REVISION Left 10/08/2020   Procedure: REVISION LEFT TRANSMETATARSAL AMPUTATION;  Surgeon: Newt Minion, MD;  Location: [facility];  Service: Orthopedics;  Laterality: Left;  . TOE AMPUTATION Right 06/05/2013   RIGHT GREAT TOE PARTIAL AMPUTATION    . TOOTH EXTRACTION  2011   multiple teeth extracted   Social History   Occupational History  . Occupation: retired  Tobacco Use  . Smoking status: Current Some Day Smoker    Packs/day: 0.20    Years: 43.00    Pack years: 8.60    Types: Cigarettes    Start date: 11/29/1973  . Smokeless tobacco: Never Used  . Tobacco comment: occasional smoker - ~1 pack/week. smoked ~1.5ppd until 5 yr ago  Vaping Use  . Vaping Use: Never used  Substance and Sexual Activity  . Alcohol use: No    Alcohol/week: 0.0 standard drinks  . Drug use: No  . Sexual activity: Not Currently    Partners: Female

## 2020-12-16 DIAGNOSIS — T8781 Dehiscence of amputation stump: Secondary | ICD-10-CM | POA: Diagnosis not present

## 2020-12-16 DIAGNOSIS — L97518 Non-pressure chronic ulcer of other part of right foot with other specified severity: Secondary | ICD-10-CM | POA: Diagnosis not present

## 2020-12-16 DIAGNOSIS — M86672 Other chronic osteomyelitis, left ankle and foot: Secondary | ICD-10-CM | POA: Diagnosis not present

## 2020-12-16 DIAGNOSIS — G9009 Other idiopathic peripheral autonomic neuropathy: Secondary | ICD-10-CM | POA: Diagnosis not present

## 2020-12-16 DIAGNOSIS — N189 Chronic kidney disease, unspecified: Secondary | ICD-10-CM | POA: Diagnosis not present

## 2020-12-16 DIAGNOSIS — T8744 Infection of amputation stump, left lower extremity: Secondary | ICD-10-CM | POA: Diagnosis not present

## 2020-12-16 DIAGNOSIS — K5792 Diverticulitis of intestine, part unspecified, without perforation or abscess without bleeding: Secondary | ICD-10-CM | POA: Diagnosis not present

## 2020-12-16 DIAGNOSIS — I129 Hypertensive chronic kidney disease with stage 1 through stage 4 chronic kidney disease, or unspecified chronic kidney disease: Secondary | ICD-10-CM | POA: Diagnosis not present

## 2020-12-16 DIAGNOSIS — J449 Chronic obstructive pulmonary disease, unspecified: Secondary | ICD-10-CM | POA: Diagnosis not present

## 2020-12-18 ENCOUNTER — Other Ambulatory Visit: Payer: Self-pay

## 2020-12-18 ENCOUNTER — Encounter (HOSPITAL_BASED_OUTPATIENT_CLINIC_OR_DEPARTMENT_OTHER): Payer: Medicare PPO | Attending: Internal Medicine | Admitting: Internal Medicine

## 2020-12-18 DIAGNOSIS — G9009 Other idiopathic peripheral autonomic neuropathy: Secondary | ICD-10-CM | POA: Diagnosis not present

## 2020-12-18 DIAGNOSIS — L97512 Non-pressure chronic ulcer of other part of right foot with fat layer exposed: Secondary | ICD-10-CM | POA: Diagnosis not present

## 2020-12-18 DIAGNOSIS — L97518 Non-pressure chronic ulcer of other part of right foot with other specified severity: Secondary | ICD-10-CM | POA: Diagnosis not present

## 2020-12-18 DIAGNOSIS — G629 Polyneuropathy, unspecified: Secondary | ICD-10-CM | POA: Diagnosis not present

## 2020-12-18 DIAGNOSIS — R569 Unspecified convulsions: Secondary | ICD-10-CM | POA: Diagnosis not present

## 2020-12-18 DIAGNOSIS — Z89411 Acquired absence of right great toe: Secondary | ICD-10-CM | POA: Insufficient documentation

## 2020-12-18 NOTE — Progress Notes (Signed)
[name], [name] ([id number]) Visit Report for 12/18/2020 Debridement Details Patient Name: Date of Service: Cory DA MS, [name] 12/18/2020 11:00 Cory M Medical Record Number: [id number] Patient Account Number: [id number] Date of Birth/Sex: Treating RN: [birth year] (64 y.o. [name] Primary Care Provider: [name] Other Clinician: Referring Provider: Treating Provider/Extender: [name] in Treatment: 26 Debridement Performed for Assessment: Wound #12 Right Metatarsal head first Performed By: Physician [name]., MD Debridement Type: Debridement Level of Consciousness (Pre-procedure): Awake and Alert Pre-procedure Verification/Time Out Yes - 12:05 Taken: Start Time: 12:06 Pain Control: Lidocaine 4% Topical Solution T Area Debrided (L x W): otal 4 (cm) x 3 (cm) = 12 (cm) Tissue and other material debrided: Non-Viable, Callus Level: Non-Viable Tissue Debridement Description: Selective/Open Wound Instrument: Blade Bleeding: Minimum End Time: 12:08 Procedural Pain: 0 Post Procedural Pain: 0 Response to Treatment: Procedure was tolerated well Level of Consciousness (Post- Awake and Alert procedure): Post Debridement Measurements of Total Wound Length: (cm) 3.7 Width: (cm) 2.5 Depth: (cm) 0.8 Volume: (cm) 5.812 Character of Wound/Ulcer Post Debridement: Improved Post Procedure Diagnosis Same as Pre-procedure Electronic Signature(s) Signed: 12/18/2020 4:36:20 PM By: [name] MD Signed: 12/18/2020 4:56:08 PM By: Deon Pilling Entered By: [name] on 12/18/2020 13:17:13 -------------------------------------------------------------------------------- HPI Details Patient Name: Date of Service: Cory DA MS, [name] 12/18/2020 11:00 Cory M Medical Record Number: [id number] Patient Account Number: [id number] Date of Birth/Sex: Treating RN: February 22, 1956 (65 y.o. [name] Primary Care Provider: [name] Other  Clinician: Referring Provider: Treating Provider/Extender: [name] in Treatment: 26 History of Present Illness Location: right first metatarsal head on the plantar aspect and the left big toe and the right fifth toe Quality: Patient reports No Pain. Severity: Patient states wound(s) are getting worse. Duration: Patient has had the wound for > 24 months prior to seeking treatment at the wound center Context: The wound would happen gradually Modifying Factors: Wound improving due to current treatment. he has been under podiatry care for over Cory year and Cory half ssociated Signs and Symptoms: Patient reports having: heaviness in both legs Cory HPI Description: This 65 year old gentleman who has had Cory long-standing neuropathy of both feet without history of diabetes mellitus has never been worked up for his neurological problem in the last 5 years or so. Most recently he has been seen by the podiatrist Dr. [name] who was been seeing them for about Cory year and Cory half and from what I understand has been treating him for bilateral ulceration on the feet, the right being in the region of the first metatarsal head plantar aspect and the left being in the area of the left big toe. He's had various antibiotics including Levaquin recently and this was then switched to Cipro and clindamycin. Due to the nature of this wound which has been there for Cory long while he has been recently referred to [name] for an opinion. An MRI was done in the middle of June which showed several small abscesses near the amputation site of the right great toe with draining and open wounds with surrounding cellulitis but no evidence of septic arthritis, osteomyelitis or pyomyositis. I understand at this stage he was taken to the operating room by [name] who debrided the wound and cleaned out the abscess. I understand ABIs were done bilaterally and they were within normal limits at Dr. [name]  office. The patient is Cory smoker and is working on quitting smoking. 07/13/2017 --  had Cory lower extremity arterial duplex evaluation which showed patent bilateral lower extremity arteries without evidence of hemodynamically significant stenosis. His right ABI was 1.05 and the left was 0.95 He had Cory lower extremity venous duplex reflux evaluation done which showed significant venous incompetence in the bilateral common femoral, saphenofemoral junction, small saphenous vein and in the right great saphenous vein. With these findings I believe he will benefit from Cory consultation with the vascular surgeon regarding the endovenous ablation procedure. the patient's neurology appointment and dermatology appointment is still pending. He has had Cory x-ray done by his PCP and the results are not available. However he did see [name] who I understand has ordered Cory bilateral MRI of his feet. 07/26/2017 -- was seen by [name] on 07/20/2017. His impression was that he presented with minimal bilateral lower extremity peripheral arterial disease, bilateral lower extremity chronic venous insufficiency (C4) and radiculopathy versus neuropathy bilateral lower extremity. No arterial intervention was needed at the present time. He recommended maximal medical management at this stage for his atherosclerotic disease . He recommended compression to be continued and no venous intervention at the present time. He was offered follow-up in the Vein clinic in 3 months time. MR of the right foot done with and without contrast -- IMPRESSION:Wound on the plantar surface of the foot at approximately the level of first MTP joint with Cory tiny underlying soft tissue abscess. Large area of soft tissue edema subjacent to the wound is most compatible with granulation tissue. Negative for osteomyelitis about the first MTP joint. Mild edema and enhancement in the proximal phalanx of the little toe is stable compared to the  prior examination and may be due to stress change. Infection is possible but thought highly unlikely. Subcutaneous edema over the dorsum of the foot compatible with dependent change/cellulitis. the patient also has had Cory abdominal CT which shows Cory kidney mass and is going to have her MRI for this. He also has Cory urology opinion pending and Cory general surgeon's opinion regarding Cory colonic mass. 08/02/2017 -- his appointments with his other urology and surgical consultants is still later. He has an MRI of the left foot later this week. 08/09/2017 --MR of the left foot with and without contrast -- IMPRESSION:Large appearing skin wound on the medial aspect of the great toe without underlying abscess or osteomyelitis.First MTP osteoarthritis. Marked marrow edema in the medial sesamoid bone is likely related to osteoarthritis but could be due to sesamoiditis. 08/16/2017 -- the patient has had Cory surgical opinion and Cory urological opinion and Cory extensive surgery including possible colon resection, bladder surgery and kidney surgery is being planned. No date has been set for his procedure He continues to be off smoking 08/31/2017 -- he was seen by the neurologist for his multiple problems of neuropathic and radicular symptoms in both legs with Cory thorough workup done. MRIs were reviewed lab work was reviewed and the assessment was that he had severe neuropathy due to multifactorial reasons and Cory neuropathy panel of lab work was ordered and he would be reviewed back. Patient is also due for colonoscopy to be repeated before his bladder and kidney surgery. At the present time he would like [name] to continue with conservative and symptomatic wound care for both his feet. 10/05/2017 -- since I have seen him last time he has had Cory colonoscopy which was fairly normal except for Cory polyp and he has Cory urology appointment pending later today. He still continues to stay  off smoking. 10/26/2017-- he returns after about 3  weeks and in the meanwhile he was seen by Dr. Kellie Simmering, who reviewed his venous studies and noted that there was very minimal enlargement of the bilateral great saphenous vein on the right and somewhat larger vein on the left but no consistent reflux throughout the great saphenous veins and there was also some right small saphenous veins which were enlarged with some reflux. However overall he thought that venous insufficiency was not significantly the cause of his ulceration and it was more of trophic ulcers and hence he recommended aggressive wound care, and at some stage he may need amputation of the left first toe and the right fifth toe. 11/09/2017 -- due to the inclement weather he has missed his urology appointment and is still awaiting the rescheduled one. Other than that he's been doing fairly well. 11/23/2017 -- he has had his cystoscopy done by his urologist and they found multiple areas of concern and he is going to be set up for Cory procedure sometime later in January. He is on complex care as far as his wounds go and seizures every 2-3 weeks 12/07/17 patient presents today for evaluation concerning his right great toe amputation site as well as his left great toe ulcer. He has been tolerating the dressing changes fairly well. With that being said there does not appear to be any significant discomfort at this point that he is experiencing although he has quite significant callous especially in regard to the amputation site of the right great toe. Nonetheless he has no evidence of infection which is good news No fevers, chills, nausea, or vomiting noted at this time. He has no pain. READMISSION 05/09/18; is Cory patient who hasn't been here in quite some time. He is not Cory diabetic but he has severe idiopathic peripheral neuropathy. He was here for review of Cory nonhealing wound on the right great toe amputation site and Cory large portion of the tip of the left great toe. Sometime after his last visit  here he became ill he had abdominal issues infections and required hospitalizations. He largely dropped off the map. He is putting silver alginate on the wounds. I note that he had arterial studies and saw Dr. Bridgett Larsson of vascular surgery was not felt to have an arterial issue. He also had reflux studies and he wears compression stockings and he is faithful with these. He had MRI of both of his feet that not show osteomyelitis question small abscess on the right. He tells me that the nonhealing wound on his right great toe amputation site is been there since the actual amputation which was in 2014 I note the area was aggressively debrided by Dr. Jacqualyn Posey of podiatry before he started coming to this clinic apparently there were abscess is present at that time. His last formal arterial studies were done in August 2018 which time his ABI was 1.05 on the right and 0.95 on the left he had biphasic and triphasic waveforms on the left and monophasic and biphasic waveforms on the right. He did not have TBI's. ABIs in our clinic today were 0.8 bilaterally. The patient states he is not Cory diabetic. He does have idiopathic peripheral neuropathy. He is Cory smoker but is trying to quit with Chantix. 05/16/18; x-rays of his bilateral feet were negative for osteomyelitis. He was noted to have Cory chronic deformity of the head of the second metatarsal which may be due to previous osteonecrosis there was no definite underlying  bone destruction to suggest osteomyelitis on either side. He came in with the extensive callus over his right great toe amputation site and the left great toe did extensive debridement last week and another extensive debridement today.patient is changing his dressing himself with silver alginate 05/30/18; still buildup of thick callus covering thick subcutaneous tissue around both of these areas. This requires an extensive debridement on both sides. I have discussed offloading this area with the patient. I  think he inverts when he walks. His foot sizes beyond what we can put in Cory Darco forefoot off loader or probably what we can put in Cory total contact cast. 06/13/18; still Cory remarkable buildup of callus over the right first toe amputation site. The left first toe has 2 open wounds one medially and 1 at the tip of the toe. Surrounding callus is also not back here. We've been using silver alginate. He has Cory darco forefoot off loader now on the right foot.his own shoe on the left foot. Would like to try Cory total contact cast on the right. He is making arrangements for someone to drive him here in 2 weeks. 06/29/18; still Cory buildup of callus over the right first toe amputation site although not as significant as previously. The left first toe has 2 open wounds one medially and one at the tip surrounding callus as well. We've been using silver alginate. He comes in today with the right for Cory total contact cast which I'll use on the right 07/04/18 on evaluation today patient appears to be doing decently well in regard to the cast. Unfortunately he did have Cory little area that rubbed on the lateral aspect of his leg the cast actually cracked on the medial aspect this happened he states just yesterday. He has not been walking in the cast without the boot portion on. With that being said he states that he is unsure of exactly what happened with the crack. Nonetheless this does seem to have caused Cory little bit of rubbing on the lateral aspect I think due to the integrity of the cast being compromised. Nonetheless I advised him that if this happens in the future he needs to let us know soon as possible. Nonetheless I do feel like the cast has been of benefit some of the area on the distal portion of his great toe amputation site actually appears to be doing better. 07/12/18 on evaluation today patient appears to be doing very well in regard to his right great toe invitation site. This seems to be showing signs of  excellent improvement which is great news. There does not appear to be any evidence of infection at this time. With that being said he has been tolerating the dressing changes without complication. There appears to be no evidence of infection and again this is barely open at this point. The total contact cast has been of excellent benefit for him. I believe this will likely be completely closed and ready next week that we can switch to Cory cast on the left foot. He states he may want to take Cory week's break. 07/19/18 on evaluation today patient appears to be doing better in regard to both ulceration areas. The right foot actually appears to be completely healed he still has some callous but no open wound. The left foot/great toe actually seems to be better after last week's debridement overall the appearance of the wound is greatly improved. 08/09/18 on evaluation today patient actually appears to be doing Cory little  bit worse in regard to the right metatarsal invitation site. He's been tolerating the dressing changes without complication. With that being said he does note that the area actually cracked open as of today when he was in the shower trying to scrub it. Nonetheless it does appear that the Caryn Section that previously was open and draining has subsequently reopened and is draining again. Nonetheless as I was looking at him and performing the debridement today I did inquire about whether or not we had ever biopsy the area as I was concerned about the possibility for something along the lines of Cory work type virus/infection causing the excessive callous buildup. Especially since he seems to grow much more than what would just be traditionally expected by friction alone. He subsequently told me that he did have this biopsied somewhere around 1-2 years ago by Dr. Jacqualyn Posey and that it showed that he had Cory wart infection at that point. Nonetheless he was never referred to dermatology better Jacqualyn Posey attempted to  cut it out according to the patient. Nonetheless that may be part of the big issue with what we're dealing with here and without treating the wart infection we may not actually get this to heal and stay closed. READMISSION 12/07/2018 The patient is back in clinic today essentially with wounds in the same area and condition is that I think the last 2 times he has been here. This includes the right first metatarsal head in the setting of Cory previous right great toe amputation and Cory large part of the medial part of his left great toe. The patient states he has been dealing with this for years dating back to 2005 on and off. That he is never found this to be totally healed. When he was last in this clinic he had this biopsied shave biopsies which showed hyperkeratotic and parakeratotic debris. Epithelium with papillary features negative for malignancy. He has been using silver alginate. He has healing sandals which he uses Felt on the bottom of these. When he was here last time we put him Cory total contact cast at least for Cory while and the patient states on the right that helped with the buildup of hyperkeratotic tissue and that seems to be reflective in our notes. He also states that was previously biopsied by Dr. Jacqualyn Posey of podiatry. The patient has Cory severe idiopathic peripheral neuropathy which he attributes to Cory brown recluse spider bite in 2002 or so. He is not Cory diabetic The patient had arterial studies in this showed an ABI in the right of 1.05 on the left was 0.95. TBI's were not done. He had monophasic waveforms at the right posterior tibial biphasic at the dorsalis pedis. On the left he was biphasic at the posterior tibial and triphasic at the dorsalis pedis. 1/17; this is Cory patient with Cory very challenging issue was readmitted to the clinic last week. He has Cory large area over the first right MTP in the setting of Cory previous right great toe amputation. On presentation this is covered with copious  amounts of hyperkeratotic thick callus nonviable tissue. When this is debridement both on this occasion and previous occasions in this clinic he has Cory very boggy presentation to the subcutaneous tissue which looks somewhat atypical. He has Cory similar area on the left great toe from the tip of the toe down the medial aspect beyond the interphalangeal joint. He's had previous MRIs of both of his feet. On the right that showed small abscesses and he  was taken to the OR by podiatry for debridement and cleaning out of the abscesses. An MRI did not show osteomyelitis of the left first toe. Both the MRIs were done in 2018. He saw Dr. Vallarie Mare at the time who did not think he had significant macrovascular disease. He is not Cory diabetic but he has idiopathic peripheral neuropathy and was Cory significant smoker. When he was in the clinic earlier in 2019 he had biopsies done of both areas that showed hyperkeratotic material but no malignancy. X-rays I ordered last week did not show osteomyelitis on the left but did suggest osteomyelitis at the tip of the left great toe. I looked at the x-rays myself this is Cory subtle finding it is only seen on the lateral film. I did Cory very significant debridement on him last week bilaterally in both the wound areas look better. When he was previously in our clinic the area on the right did improve with Cory total contact cast. He dropped out of the schedule at that point I'm not exactly sure why 1/24; the biopsy I did of the area over his right first metatarsal head showed the possibility of verruca vulgaris. Superimposed changes of lichen simplex chronicus. If this is Cory plantar wart this would be extremely large. I think he would need topical destructive therapy and I am going to refer him to dermatology. There was no suggestion of malignancy. Previous biopsies in this clinic on both his areas did not show evidence of malignancy either. Until we get dermatology I am going to continue with  silver alginate. X-rays suggested osteomyelitis of the left toe I am going to continue him on trimethoprim sulfamethoxazole 2/3; not much change in this area on the right first met head. Extensive dark eschar formation around the central area of nonviable subcutaneous tissue looking like Cory boggy subcutaneous tissue. Using Cory #10 scalpel and pickups I removed all of this. I have tried this before and this man but I plan to put him in Cory total contact cast next week Using Cory #5 curette debridement of the left great toe He has an appointment with dermatology at [facility] on the 25th 2/11; right first metatarsal head after last week's extensive debridement is right back to the way it was with Cory nonviable subcutaneous tissue and thick surrounding eschar. The left great toe looks about the same. The patient could not have Cory total contact cast today because he had household problems [leaking roof] but I am not going to put Cory cast on him until I have information from the dermatologist about this. READMISSION 06/17/2020 Mr. Nair is now Cory 65 year old man that we have had in this clinic on at least 2 occasions previously. When he was last here in February 2020 I referred him to dermatology at [facility] for their review of large wounds on the right first metatarsal head with surrounding severe chronic hyperkeratosis. This was biopsied on at least 2 occasions in our clinic and I think at [facility] as well that did not show an underlying malignancy. I have reviewed the consult from Dr. Sharlette Dense dermatology from 01/23/2019. At that time the patient had wounds on the plantar right first metatarsal head as well as the plantar left great toe. From what he is saying the left great toe healed however he recently had to have Cory left TMA by Dr. Sharol Given because of osteomyelitis in the forefoot we did not actually get Cory look at his foot today. Dermatology at [facility] apparently referred him  to Dr. Dawna Part of plastic surgery and an operative  excision of this area was planned however apparently they ran into the pandemic. The procedure was repetitively cancel and the patient did not keep his final appointment which I think was earlier this year he was also seen in February of this year at the wound care center in [facility] by Dr. Ronda Fairly. They were applying Silvadene cream and Xeroform which she is essentially doing now. I see in his notes that there was still some concern about underlying malignancy although would be difficult to believe as long as this is been there that there would not be more extensive damage at this point in any case he is back in our clinic for review of this. He had an x-ray of the foot on 01/23/2020 that was negative for osteomyelitis acute fracture. Past medical history; idiopathic peripheral neuropathy, right great toe amputation in 2011, lumbar spinal stenosis, left transmetatarsal amputation on 05/30/2020 by Dr. Lajoyce Corners and more recently apparently Cory slight wound dehiscence. He also had Cory fracture of his left tibial plateau sometime in 2020. ABI in our clinic was 0.95 8/9-Patient returns after being established in the clinic last time, he has Cory new open area on the plantar aspect of his right second toe, the right foot plantar ulcer below the great toe on the right looks slightly smaller, using PolyMem. Patient attends to his surgeon for the left foot wound 8/23; this is Cory patient with Cory refractory wound over his right first metatarsal head in the setting of severe surrounding hyperkeratosis. I had referred him to dermatology subsequently came to the attention of Dr. Mardene Speak and Dr. Ronda Fairly at [facility]. He also had Cory left TMA by Dr. Lajoyce Corners on the left foot because of osteomyelitis I believe. He is in bilateral surgical shoes. 9/13; refractory wound over the right first metatarsal head with surrounding hyperkeratosis Verrucous skin. He had Cory left transmetatarsal amputation by Dr. Lajoyce Corners. We have not been following this however this  apparently is still not closed. I have looked over his notes from [facility]. This is well summarized by Cory note from Dr. Ronda Fairly on February 24. The patient had had several biopsies of the skin of the first toe metatarsal head. As well as the left great toe which has since been amputated. The pathology results showed Stratus corneum with hyperkeratosis and parakeratosis. An underlying verricous carcinoma could not be excluded. I think the patient was given follow-up but I will actually think that he kept his appointments. He was sent back to follow-up with him with them again in February 2021. Dr. Dawna Part who is the plastic surgeon recommended wound care rather than extensive debridement. I am very doubtful that this represents any form of malignancy. This patient has been dealing with this wound for years and I think we would have had Cory declaration of status well before this if this were any form of malignancy. I also do not believe he has an arterial issue. ABI in the clinic here was 0.95. He has easily palpable peripheral pulses. 10/4; generally gradually better wound area over the right plantar foot first metatarsal head.Marland Kitchen He still has thick surrounding callus around the wound margins and Cory fibrinous debris over the wound surface although in general Cory lot of this looks considerably better. He has been following with orthopedics for Cory left amputation by Dr. Lajoyce Corners. We have not been following this 10/18; first metatarsal head wound is still open however there is less adherent callus and thick  skin around the edge of this. I think this largely has to do with aggressive debridement last time and I am going to repeat this today. He is still dealing with Cory wound on the left transmetatarsal amputation site this is being followed by Dr. Sharol Given. He has Cory surgical shoe on the left foot we have not been following this. He is using crutches apparently reliably to offload both his feet. 11/1 right first metatarsal head.  Smaller surface area considerable amount of callus around this wound. This is Cory chronic issue. He has had Cory left transmetatarsal amputation followed by Dr. Sharol Given there is still wounds in this area he is followed with Dr. Sharol Given has Cory surgical shoe here 11/15; right first metatarsal head I think the wound is about the same substantial surrounding callus. This is Cory chronic issue. He apparently has been back to the OR with Dr. Sharol Given with regards to his left foot wound/amputation site he comes in today with Cory new surgical wound and Cory wound VAC with Cory full canister. The relevance of this to [name] as I had really wanted to put this man in Cory total contact cast but I cannot do that as long as he is in Cory surgical boot on the left side 11/29; right first metatarsal head about the same. Substantial amount of surrounding callus. This is easily removed however Cory lot of bleeding of underlying tissue. This is been this way for Cory long time. Apparently he has had more surgery on the left foot we are not following this this is been followed by his surgeon Dr. Sharol Given. We have not looked at this we have been using polymen over the 12/13; right first metatarsal head some improvement. Aggressive debridement last time. We have been using polymen Ag. He has been back to see Dr. Sharol Given still with an open wound on the plantar left foot we have not been following this but I have been reassured by the patient that it is still open 1/20; right first metatarsal head about in the same condition as usual. Thick black callus nonviable subcutaneous surface. Bleeds very easily. This has been biopsied multiple times in the past and I do not believe that it was ever diagnostic however the wound has not changed that much in the long period of time I have followed him. He had called last week asking if we would look at his nonhealing transmit site on the left foot. This was done by Dr. Sharol Given I have not actually looked at the wound. The patient is  concerned because Dr. Sharol Given was recommending Cory below-knee amputation. He went for Cory second opinion to Dr. Doran Durand who concurred with this. He is scheduled for Cory below-knee amputation on 01/02/2021. He is not running Cory fever and he is neuropathic therefore insensate therefore not complaining of Cory lot of pain. If there is infection in this area the patient is not aware of it. Electronic Signature(s) Signed: 12/18/2020 4:36:20 PM By: [name] MD Entered By: [name] on 12/18/2020 13:18:42 -------------------------------------------------------------------------------- Physical Exam Details Patient Name: Date of Service: Cory DA MS, [name] 12/18/2020 11:00 Cory M Medical Record Number: [id number] Patient Account Number: [id number] Date of Birth/Sex: Treating RN: [birth year] (65 y.o. [name] Primary Care Provider: [name] Other Clinician: Referring Provider: Treating Provider/Extender: [name] in Treatment: 42 Constitutional Patient is hypertensive.. Pulse regular and within target range for patient.Marland Kitchen Respirations regular, non-labored and within target range.. Temperature is normal and within  the target range for the patient.Marland Kitchen Appears in no distress. Cardiovascular Pedal pulses are palpable. Notes Wound exam; right first metatarsal head raised areas of thick black irregular callus nonviable subcutaneous tissue protruding around the circumference I removed all of this with Cory #10 scalpel. This makes things more presentable but certainly has not resulted in any healing there is no exposed bone. Electronic Signature(s) Signed: 12/18/2020 4:36:20 PM By: [name] MD Entered By: [name] on 12/18/2020 13:19:46 -------------------------------------------------------------------------------- Physician Orders Details Patient Name: Date of Service: Cory DA MS, [name] 12/18/2020 11:00 Cory M Medical Record Number: [id number] Patient  Account Number: [id number] Date of Birth/Sex: Treating RN: [birth year] (65 y.o. [name] Primary Care Provider: [name] Other Clinician: Referring Provider: Treating Provider/Extender: [name] in Treatment: 70 Verbal / Phone Orders: No Diagnosis Coding ICD-10 Coding Code Description L97.518 Non-pressure chronic ulcer of other part of right foot with other specified severity G90.09 Other idiopathic peripheral autonomic neuropathy Follow-up Appointments Return appointment in 1 month. Bathing/ Shower/ Hygiene May shower with protection but do not get wound dressing(s) wet. Off-Loading Open toe surgical shoe to: - right foot Home Health No change in wound care orders this week; continue Home Health for wound care. May utilize formulary equivalent dressing for wound treatment orders unless otherwise specified. Alvis Lemmings Wound Treatment Wound #12 - Metatarsal head first Wound Laterality: Right Cleanser: Wound Cleanser (Home Health) Every Other Day/15 Days Discharge Instructions: Cleanse the wound with wound cleanser or normal saline prior to applying Cory clean dressing using gauze sponges, not tissue or cotton balls. Peri-Wound Care: Sween Lotion (Moisturizing lotion) (Home Health) Every Other Day/15 Days Discharge Instructions: Apply moisturizing lotion as directed Prim Dressing: PolyMem Silver Non-Adhesive Dressing, 4.25x4.25 in [facility]) Every Other Day/15 Days ary Discharge Instructions: Apply to wound bed as instructed Secondary Dressing: Woven Gauze Sponge, Non-Sterile 4x4 in [facility]) Every Other Day/15 Days Discharge Instructions: Apply over primary dressing as directed. Secondary Dressing: Felt 2.5 yds x 5.5 in [facility]) Every Other Day/15 Days Discharge Instructions: Apply felt or foam donut over primary dressing as directed. Secured With: [employer], 4.5x3.1 (in/yd) [facility]) Every Other Day/15  Days Discharge Instructions: Secure with Kerlix as directed. Secured With: Paper Tape, 2x10 (in/yd) (Home Health) Every Other Day/15 Days Discharge Instructions: Secure dressing with tape as directed. [facility] - Referral to [facility] wound center Dr. Zigmund Daniel related to second opinion to left transmet surgical wound with possibility to left below the knee amputation. Wound evaluation of right foot wound as well. - (ICD10 L97.518 - Non-pressure chronic ulcer of other part of right foot with other specified severity) Electronic Signature(s) Signed: 12/18/2020 4:36:20 PM By: [name] MD Signed: 12/18/2020 4:56:08 PM By: Deon Pilling Entered By: Deon Pilling on 12/18/2020 12:14:20 Prescription 12/18/2020 -------------------------------------------------------------------------------- Alden Server MD Patient Name: Provider: 04-13-1956 [id number] Date of Birth: NPI#: [name] [id number] Sex: DEA #: [phone] Phone #: License #: [employer] Patient Address: [address] [place], [place] [address] [place], [place] [address] [phone] Allergies No Known Drug Allergies Provider's Orders [facility] - ICD10: L97.518 - Referral to [facility] wound center Dr. Zigmund Daniel related to second opinion to left transmet surgical wound with possibility to left below the knee amputation. Wound evaluation of right foot wound as well. Hand Signature: Date(s): Electronic Signature(s) Signed: 12/18/2020 4:36:20 PM By: [name] MD  Signed: 12/18/2020 4:56:08 PM By: Deon Pilling Entered By: Deon Pilling on 12/18/2020 12:14:20 -------------------------------------------------------------------------------- Problem List Details Patient Name: Date of Service: Cory DA MS, [name] 12/18/2020 11:00 Cory M Medical Record Number: [id number] Patient Account Number: [id number] Date of  Birth/Sex: Treating RN: 01-Aug-1956 (65 y.o. [name] Primary Care Provider: [name] Other Clinician: Referring Provider: Treating Provider/Extender: [name] in Treatment: 26 Active Problems ICD-10 Encounter Code Description Active Date MDM Diagnosis L97.518 Non-pressure chronic ulcer of other part of right foot with other specified 06/17/2020 No Yes severity G90.09 Other idiopathic peripheral autonomic neuropathy 06/17/2020 No Yes Inactive Problems Resolved Problems Electronic Signature(s) Signed: 12/18/2020 4:36:20 PM By: [name] MD Entered By: [name] on 12/18/2020 13:16:57 -------------------------------------------------------------------------------- Progress Note Details Patient Name: Date of Service: Cory DA MS, [name] 12/18/2020 11:00 Cory M Medical Record Number: [id number] Patient Account Number: [id number] Date of Birth/Sex: Treating RN: 1956/03/19 (65 y.o. [name] Primary Care Provider: [name] Other Clinician: Referring Provider: Treating Provider/Extender: [name] in Treatment: 26 Subjective History of Present Illness (HPI) The following HPI elements were documented for the patient's wound: Location: right first metatarsal head on the plantar aspect and the left big toe and the right fifth toe Quality: Patient reports No Pain. Severity: Patient states wound(s) are getting worse. Duration: Patient has had the wound for > 24 months prior to seeking treatment at the wound center Context: The wound would happen gradually Modifying Factors: Wound improving due to current treatment. he has been under podiatry care for over Cory year and Cory half Associated Signs and Symptoms: Patient reports having: heaviness in both legs This 65 year old gentleman who has had Cory long-standing neuropathy of both feet without history of diabetes mellitus has never been worked up for  his neurological problem in the last 5 years or so. Most recently he has been seen by the podiatrist Dr. [name] who was been seeing them for about Cory year and Cory half and from what I understand has been treating him for bilateral ulceration on the feet, the right being in the region of the first metatarsal head plantar aspect and the left being in the area of the left big toe. He's had various antibiotics including Levaquin recently and this was then switched to Cipro and clindamycin. Due to the nature of this wound which has been there for Cory long while he has been recently referred to [name] for an opinion. An MRI was done in the middle of June which showed several small abscesses near the amputation site of the right great toe with draining and open wounds with surrounding cellulitis but no evidence of septic arthritis, osteomyelitis or pyomyositis. I understand at this stage he was taken to the operating room by [name] who debrided the wound and cleaned out the abscess. I understand ABIs were done bilaterally and they were within normal limits at Dr. [name] office. The patient is Cory smoker and is working on quitting smoking. 07/13/2017 -- had Cory lower extremity arterial duplex evaluation which showed patent bilateral lower extremity arteries without evidence of hemodynamically significant stenosis. His right ABI was 1.05 and the left was 0.95 He had Cory lower extremity venous duplex reflux evaluation done which showed significant venous incompetence in the bilateral common femoral, saphenofemoral junction, small saphenous vein and in the right great saphenous vein. With these findings I believe he will benefit from Cory consultation with the vascular surgeon regarding the  endovenous ablation procedure. the patient's neurology appointment and dermatology appointment is still pending. He has had Cory x-ray done by his PCP and the results are not available. However he did see [name] who I  understand has ordered Cory bilateral MRI of his feet. 07/26/2017 -- was seen by [name] on 07/20/2017. His impression was that he presented with minimal bilateral lower extremity peripheral arterial disease, bilateral lower extremity chronic venous insufficiency (C4) and radiculopathy versus neuropathy bilateral lower extremity. No arterial intervention was needed at the present time. He recommended maximal medical management at this stage for his atherosclerotic disease . He recommended compression to be continued and no venous intervention at the present time. He was offered follow-up in the Vein clinic in 3 months time. MR of the right foot done with and without contrast -- IMPRESSION:Wound on the plantar surface of the foot at approximately the level of first MTP joint with Cory tiny underlying soft tissue abscess. Large area of soft tissue edema subjacent to the wound is most compatible with granulation tissue. Negative for osteomyelitis about the first MTP joint. Mild edema and enhancement in the proximal phalanx of the little toe is stable compared to the prior examination and may be due to stress change. Infection is possible but thought highly unlikely. Subcutaneous edema over the dorsum of the foot compatible with dependent change/cellulitis. the patient also has had Cory abdominal CT which shows Cory kidney mass and is going to have her MRI for this. He also has Cory urology opinion pending and Cory general surgeon's opinion regarding Cory colonic mass. 08/02/2017 -- his appointments with his other urology and surgical consultants is still later. He has an MRI of the left foot later this week. 08/09/2017 --MR of the left foot with and without contrast -- IMPRESSION:Large appearing skin wound on the medial aspect of the great toe without underlying abscess or osteomyelitis.First MTP osteoarthritis. Marked marrow edema in the medial sesamoid bone is likely related to osteoarthritis but could be due to  sesamoiditis. 08/16/2017 -- the patient has had Cory surgical opinion and Cory urological opinion and Cory extensive surgery including possible colon resection, bladder surgery and kidney surgery is being planned. No date has been set for his procedure He continues to be off smoking 08/31/2017 -- he was seen by the neurologist for his multiple problems of neuropathic and radicular symptoms in both legs with Cory thorough workup done. MRIs were reviewed lab work was reviewed and the assessment was that he had severe neuropathy due to multifactorial reasons and Cory neuropathy panel of lab work was ordered and he would be reviewed back. Patient is also due for colonoscopy to be repeated before his bladder and kidney surgery. At the present time he would like [name] to continue with conservative and symptomatic wound care for both his feet. 10/05/2017 -- since I have seen him last time he has had Cory colonoscopy which was fairly normal except for Cory polyp and he has Cory urology appointment pending later today. He still continues to stay off smoking. 10/26/2017-- he returns after about 3 weeks and in the meanwhile he was seen by Dr. Kellie Simmering, who reviewed his venous studies and noted that there was very minimal enlargement of the bilateral great saphenous vein on the right and somewhat larger vein on the left but no consistent reflux throughout the great saphenous veins and there was also some right small saphenous veins which were enlarged with some reflux. However overall he thought that venous  insufficiency was not significantly the cause of his ulceration and it was more of trophic ulcers and hence he recommended aggressive wound care, and at some stage he may need amputation of the left first toe and the right fifth toe. 11/09/2017 -- due to the inclement weather he has missed his urology appointment and is still awaiting the rescheduled one. Other than that he's been doing fairly well. 11/23/2017 -- he has had his  cystoscopy done by his urologist and they found multiple areas of concern and he is going to be set up for Cory procedure sometime later in January. He is on complex care as far as his wounds go and seizures every 2-3 weeks 12/07/17 patient presents today for evaluation concerning his right great toe amputation site as well as his left great toe ulcer. He has been tolerating the dressing changes fairly well. With that being said there does not appear to be any significant discomfort at this point that he is experiencing although he has quite significant callous especially in regard to the amputation site of the right great toe. Nonetheless he has no evidence of infection which is good news No fevers, chills, nausea, or vomiting noted at this time. He has no pain. READMISSION 05/09/18; is Cory patient who hasn't been here in quite some time. He is not Cory diabetic but he has severe idiopathic peripheral neuropathy. He was here for review of Cory nonhealing wound on the right great toe amputation site and Cory large portion of the tip of the left great toe. Sometime after his last visit here he became ill he had abdominal issues infections and required hospitalizations. He largely dropped off the map. He is putting silver alginate on the wounds. I note that he had arterial studies and saw Dr. Bridgett Larsson of vascular surgery was not felt to have an arterial issue. He also had reflux studies and he wears compression stockings and he is faithful with these. He had MRI of both of his feet that not show osteomyelitis question small abscess on the right. He tells me that the nonhealing wound on his right great toe amputation site is been there since the actual amputation which was in 2014 I note the area was aggressively debrided by Dr. Jacqualyn Posey of podiatry before he started coming to this clinic apparently there were abscess is present at that time. His last formal arterial studies were done in August 2018 which time his ABI was 1.05  on the right and 0.95 on the left he had biphasic and triphasic waveforms on the left and monophasic and biphasic waveforms on the right. He did not have TBI's. ABIs in our clinic today were 0.8 bilaterally. The patient states he is not Cory diabetic. He does have idiopathic peripheral neuropathy. He is Cory smoker but is trying to quit with Chantix. 05/16/18; x-rays of his bilateral feet were negative for osteomyelitis. He was noted to have Cory chronic deformity of the head of the second metatarsal which may be due to previous osteonecrosis there was no definite underlying bone destruction to suggest osteomyelitis on either side. He came in with the extensive callus over his right great toe amputation site and the left great toe did extensive debridement last week and another extensive debridement today.patient is changing his dressing himself with silver alginate 05/30/18; still buildup of thick callus covering thick subcutaneous tissue around both of these areas. This requires an extensive debridement on both sides. I have discussed offloading this area with the patient. I  think he inverts when he walks. His foot sizes beyond what we can put in Cory Darco forefoot off loader or probably what we can put in Cory total contact cast. 06/13/18; still Cory remarkable buildup of callus over the right first toe amputation site. The left first toe has 2 open wounds one medially and 1 at the tip of the toe. Surrounding callus is also not back here. We've been using silver alginate. He has Cory darco forefoot off loader now on the right foot.his own shoe on the left foot. Would like to try Cory total contact cast on the right. He is making arrangements for someone to drive him here in 2 weeks. 06/29/18; still Cory buildup of callus over the right first toe amputation site although not as significant as previously. The left first toe has 2 open wounds one medially and one at the tip surrounding callus as well. We've been using silver  alginate. He comes in today with the right for Cory total contact cast which I'll use on the right 07/04/18 on evaluation today patient appears to be doing decently well in regard to the cast. Unfortunately he did have Cory little area that rubbed on the lateral aspect of his leg the cast actually cracked on the medial aspect this happened he states just yesterday. He has not been walking in the cast without the boot portion on. With that being said he states that he is unsure of exactly what happened with the crack. Nonetheless this does seem to have caused Cory little bit of rubbing on the lateral aspect I think due to the integrity of the cast being compromised. Nonetheless I advised him that if this happens in the future he needs to let us know soon as possible. Nonetheless I do feel like the cast has been of benefit some of the area on the distal portion of his great toe amputation site actually appears to be doing better. 07/12/18 on evaluation today patient appears to be doing very well in regard to his right great toe invitation site. This seems to be showing signs of excellent improvement which is great news. There does not appear to be any evidence of infection at this time. With that being said he has been tolerating the dressing changes without complication. There appears to be no evidence of infection and again this is barely open at this point. The total contact cast has been of excellent benefit for him. I believe this will likely be completely closed and ready next week that we can switch to Cory cast on the left foot. He states he may want to take Cory week's break. 07/19/18 on evaluation today patient appears to be doing better in regard to both ulceration areas. The right foot actually appears to be completely healed he still has some callous but no open wound. The left foot/great toe actually seems to be better after last week's debridement overall the appearance of the wound is greatly  improved. 08/09/18 on evaluation today patient actually appears to be doing Cory little bit worse in regard to the right metatarsal invitation site. He's been tolerating the dressing changes without complication. With that being said he does note that the area actually cracked open as of today when he was in the shower trying to scrub it. Nonetheless it does appear that the Caryn Section that previously was open and draining has subsequently reopened and is draining again. Nonetheless as I was looking at him and performing the debridement today I did  inquire about whether or not we had ever biopsy the area as I was concerned about the possibility for something along the lines of Cory work type virus/infection causing the excessive callous buildup. Especially since he seems to grow much more than what would just be traditionally expected by friction alone. He subsequently told me that he did have this biopsied somewhere around 1-2 years ago by Dr. Jacqualyn Posey and that it showed that he had Cory wart infection at that point. Nonetheless he was never referred to dermatology better Jacqualyn Posey attempted to cut it out according to the patient. Nonetheless that may be part of the big issue with what we're dealing with here and without treating the wart infection we may not actually get this to heal and stay closed. READMISSION 12/07/2018 The patient is back in clinic today essentially with wounds in the same area and condition is that I think the last 2 times he has been here. This includes the right first metatarsal head in the setting of Cory previous right great toe amputation and Cory large part of the medial part of his left great toe. The patient states he has been dealing with this for years dating back to 2005 on and off. That he is never found this to be totally healed. When he was last in this clinic he had this biopsied shave biopsies which showed hyperkeratotic and parakeratotic debris. Epithelium with papillary features  negative for malignancy. He has been using silver alginate. He has healing sandals which he uses Felt on the bottom of these. When he was here last time we put him Cory total contact cast at least for Cory while and the patient states on the right that helped with the buildup of hyperkeratotic tissue and that seems to be reflective in our notes. He also states that was previously biopsied by Dr. Jacqualyn Posey of podiatry. The patient has Cory severe idiopathic peripheral neuropathy which he attributes to Cory brown recluse spider bite in 2002 or so. He is not Cory diabetic The patient had arterial studies in this showed an ABI in the right of 1.05 on the left was 0.95. TBI's were not done. He had monophasic waveforms at the right posterior tibial biphasic at the dorsalis pedis. On the left he was biphasic at the posterior tibial and triphasic at the dorsalis pedis. 1/17; this is Cory patient with Cory very challenging issue was readmitted to the clinic last week. He has Cory large area over the first right MTP in the setting of Cory previous right great toe amputation. On presentation this is covered with copious amounts of hyperkeratotic thick callus nonviable tissue. When this is debridement both on this occasion and previous occasions in this clinic he has Cory very boggy presentation to the subcutaneous tissue which looks somewhat atypical. He has Cory similar area on the left great toe from the tip of the toe down the medial aspect beyond the interphalangeal joint. He's had previous MRIs of both of his feet. On the right that showed small abscesses and he was taken to the OR by podiatry for debridement and cleaning out of the abscesses. An MRI did not show osteomyelitis of the left first toe. Both the MRIs were done in 2018. He saw Dr. Vallarie Mare at the time who did not think he had significant macrovascular disease. He is not Cory diabetic but he has idiopathic peripheral neuropathy and was Cory significant smoker. When he was in the clinic  earlier in 2019 he had biopsies done  of both areas that showed hyperkeratotic material but no malignancy. X-rays I ordered last week did not show osteomyelitis on the left but did suggest osteomyelitis at the tip of the left great toe. I looked at the x-rays myself this is Cory subtle finding it is only seen on the lateral film. I did Cory very significant debridement on him last week bilaterally in both the wound areas look better. When he was previously in our clinic the area on the right did improve with Cory total contact cast. He dropped out of the schedule at that point I'm not exactly sure why 1/24; the biopsy I did of the area over his right first metatarsal head showed the possibility of verruca vulgaris. Superimposed changes of lichen simplex chronicus. If this is Cory plantar wart this would be extremely large. I think he would need topical destructive therapy and I am going to refer him to dermatology. There was no suggestion of malignancy. Previous biopsies in this clinic on both his areas did not show evidence of malignancy either. Until we get dermatology I am going to continue with silver alginate. X-rays suggested osteomyelitis of the left toe I am going to continue him on trimethoprim sulfamethoxazole 2/3; not much change in this area on the right first met head. Extensive dark eschar formation around the central area of nonviable subcutaneous tissue looking like Cory boggy subcutaneous tissue. Using Cory #10 scalpel and pickups I removed all of this. I have tried this before and this man but I plan to put him in Cory total contact cast next week ooUsing Cory #5 curette debridement of the left great toe ooHe has an appointment with dermatology at [facility] on the 25th 2/11; right first metatarsal head after last week's extensive debridement is right back to the way it was with Cory nonviable subcutaneous tissue and thick surrounding eschar. The left great toe looks about the same. The patient could not have Cory  total contact cast today because he had household problems [leaking roof] but I am not going to put Cory cast on him until I have information from the dermatologist about this. READMISSION 06/17/2020 Mr. Wessinger is now Cory 65 year old man that we have had in this clinic on at least 2 occasions previously. When he was last here in February 2020 I referred him to dermatology at [facility] for their review of large wounds on the right first metatarsal head with surrounding severe chronic hyperkeratosis. This was biopsied on at least 2 occasions in our clinic and I think at [facility] as well that did not show an underlying malignancy. I have reviewed the consult from Dr. Sharlette Dense dermatology from 01/23/2019. At that time the patient had wounds on the plantar right first metatarsal head as well as the plantar left great toe. From what he is saying the left great toe healed however he recently had to have Cory left TMA by Dr. Sharol Given because of osteomyelitis in the forefoot we did not actually get Cory look at his foot today. Dermatology at [facility] apparently referred him to Dr. Luetta Nutting of plastic surgery and an operative excision of this area was planned however apparently they ran into the pandemic. The procedure was repetitively cancel and the patient did not keep his final appointment which I think was earlier this year he was also seen in February of this year at the wound care center in [facility] by Dr. Zigmund Daniel. They were applying Silvadene cream and Xeroform which she is essentially doing now. I see in  his notes that there was still some concern about underlying malignancy although would be difficult to believe as long as this is been there that there would not be more extensive damage at this point in any case he is back in our clinic for review of this. He had an x-ray of the foot on 01/23/2020 that was negative for osteomyelitis acute fracture. Past medical history; idiopathic peripheral neuropathy, right great toe amputation in  2011, lumbar spinal stenosis, left transmetatarsal amputation on 05/30/2020 by Dr. Lajoyce Corners and more recently apparently Cory slight wound dehiscence. He also had Cory fracture of his left tibial plateau sometime in 2020. ABI in our clinic was 0.95 8/9-Patient returns after being established in the clinic last time, he has Cory new open area on the plantar aspect of his right second toe, the right foot plantar ulcer below the great toe on the right looks slightly smaller, using PolyMem. Patient attends to his surgeon for the left foot wound 8/23; this is Cory patient with Cory refractory wound over his right first metatarsal head in the setting of severe surrounding hyperkeratosis. I had referred him to dermatology subsequently came to the attention of Dr. Mardene Speak and Dr. Ronda Fairly at [facility]. He also had Cory left TMA by Dr. Lajoyce Corners on the left foot because of osteomyelitis I believe. He is in bilateral surgical shoes. 9/13; refractory wound over the right first metatarsal head with surrounding hyperkeratosis Verrucous skin. He had Cory left transmetatarsal amputation by Dr. Lajoyce Corners. We have not been following this however this apparently is still not closed. I have looked over his notes from [facility]. This is well summarized by Cory note from Dr. Ronda Fairly on February 24. The patient had had several biopsies of the skin of the first toe metatarsal head. As well as the left great toe which has since been amputated. The pathology results showed Stratus corneum with hyperkeratosis and parakeratosis. An underlying verricous carcinoma could not be excluded. I think the patient was given follow-up but I will actually think that he kept his appointments. He was sent back to follow-up with him with them again in February 2021. Dr. Dawna Part who is the plastic surgeon recommended wound care rather than extensive debridement. I am very doubtful that this represents any form of malignancy. This patient has been dealing with this wound for years and I think we  would have had Cory declaration of status well before this if this were any form of malignancy. I also do not believe he has an arterial issue. ABI in the clinic here was 0.95. He has easily palpable peripheral pulses. 10/4; generally gradually better wound area over the right plantar foot first metatarsal head.Marland Kitchen He still has thick surrounding callus around the wound margins and Cory fibrinous debris over the wound surface although in general Cory lot of this looks considerably better. He has been following with orthopedics for Cory left amputation by Dr. Lajoyce Corners. We have not been following this 10/18; first metatarsal head wound is still open however there is less adherent callus and thick skin around the edge of this. I think this largely has to do with aggressive debridement last time and I am going to repeat this today. He is still dealing with Cory wound on the left transmetatarsal amputation site this is being followed by Dr. Lajoyce Corners. He has Cory surgical shoe on the left foot we have not been following this. He is using crutches apparently reliably to offload both his feet. 11/1 right first metatarsal head. Smaller  surface area considerable amount of callus around this wound. This is Cory chronic issue. He has had Cory left transmetatarsal amputation followed by Dr. Sharol Given there is still wounds in this area he is followed with Dr. Sharol Given has Cory surgical shoe here 11/15; right first metatarsal head I think the wound is about the same substantial surrounding callus. This is Cory chronic issue. He apparently has been back to the OR with Dr. Sharol Given with regards to his left foot wound/amputation site he comes in today with Cory new surgical wound and Cory wound VAC with Cory full canister. The relevance of this to [name] as I had really wanted to put this man in Cory total contact cast but I cannot do that as long as he is in Cory surgical boot on the left side 11/29; right first metatarsal head about the same. Substantial amount of surrounding callus. This  is easily removed however Cory lot of bleeding of underlying tissue. This is been this way for Cory long time. Apparently he has had more surgery on the left foot we are not following this this is been followed by his surgeon Dr. Sharol Given. We have not looked at this we have been using polymen over the 12/13; right first metatarsal head some improvement. Aggressive debridement last time. We have been using polymen Ag. He has been back to see Dr. Sharol Given still with an open wound on the plantar left foot we have not been following this but I have been reassured by the patient that it is still open 1/20; right first metatarsal head about in the same condition as usual. Thick black callus nonviable subcutaneous surface. Bleeds very easily. This has been biopsied multiple times in the past and I do not believe that it was ever diagnostic however the wound has not changed that much in the long period of time I have followed him. He had called last week asking if we would look at his nonhealing transmit site on the left foot. This was done by Dr. Sharol Given I have not actually looked at the wound. The patient is concerned because Dr. Sharol Given was recommending Cory below-knee amputation. He went for Cory second opinion to Dr. Doran Durand who concurred with this. He is scheduled for Cory below-knee amputation on 01/02/2021. He is not running Cory fever and he is neuropathic therefore insensate therefore not complaining of Cory lot of pain. If there is infection in this area the patient is not aware of it. Objective Constitutional Patient is hypertensive.. Pulse regular and within target range for patient.Marland Kitchen Respirations regular, non-labored and within target range.. Temperature is normal and within the target range for the patient.Marland Kitchen Appears in no distress. Vitals Time Taken: 11:26 AM, Height: 80 in, Weight: 350 lbs, BMI: 38.4, Temperature: 98.3 F, Pulse: 94 bpm, Respiratory Rate: 18 breaths/min, Blood Pressure: 157/95 mmHg. Cardiovascular Pedal  pulses are palpable. General Notes: Wound exam; right first metatarsal head raised areas of thick black irregular callus nonviable subcutaneous tissue protruding around the circumference I removed all of this with Cory #10 scalpel. This makes things more presentable but certainly has not resulted in any healing there is no exposed bone. Integumentary (Hair, Skin) Wound #12 status is Open. Original cause of wound was Gradually Appeared. The wound is located on the Right Metatarsal head first. The wound measures 3.7cm length x 2.5cm width x 1cm depth; 7.265cm^2 area and 7.265cm^3 volume. There is Fat Layer (Subcutaneous Tissue) exposed. There is no tunneling or undermining noted. There is Cory medium amount  of serosanguineous drainage noted. The wound margin is thickened. There is medium (34-66%) red, pink granulation within the wound bed. There is Cory medium (34-66%) amount of necrotic tissue within the wound bed including Eschar and Adherent Slough. Assessment Active Problems ICD-10 Non-pressure chronic ulcer of other part of right foot with other specified severity Other idiopathic peripheral autonomic neuropathy Procedures Wound #12 Pre-procedure diagnosis of Wound #12 is Cory Neuropathic Ulcer-Non Diabetic located on the Right Metatarsal head first . There was Cory Selective/Open Wound Non-Viable Tissue Debridement with Cory total area of 12 sq cm performed by [name]., MD. With the following instrument(s): Blade to remove Non- Viable tissue/material. Material removed includes Callus after achieving pain control using Lidocaine 4% Topical Solution. Cory time out was conducted at 12:05, prior to the start of the procedure. Cory Minimum amount of bleeding was controlled with N/Cory. The procedure was tolerated well with Cory pain level of 0 throughout and Cory pain level of 0 following the procedure. Post Debridement Measurements: 3.7cm length x 2.5cm width x 0.8cm depth; 5.812cm^3 volume. Character of Wound/Ulcer  Post Debridement is improved. Post procedure Diagnosis Wound #12: Same as Pre-Procedure Plan Follow-up Appointments: Return appointment in 1 month. Bathing/ Shower/ Hygiene: May shower with protection but do not get wound dressing(s) wet. Off-Loading: Open toe surgical shoe to: - right foot Home Health: No change in wound care orders this week; continue Home Health for wound care. May utilize formulary equivalent dressing for wound treatment orders unless otherwise specified. Alvis Lemmings ordered were: [facility] wound center - Referral to [facility] wound center Dr. Zigmund Daniel related to second opinion to left transmet surgical wound with possibility to left below the knee amputation. Wound evaluation of right foot wound as well. WOUND #12: - Metatarsal head first Wound Laterality: Right Cleanser: Wound Cleanser (Home Health) Every Other Day/15 Days Discharge Instructions: Cleanse the wound with wound cleanser or normal saline prior to applying Cory clean dressing using gauze sponges, not tissue or cotton balls. Peri-Wound Care: Sween Lotion (Moisturizing lotion) (Home Health) Every Other Day/15 Days Discharge Instructions: Apply moisturizing lotion as directed Prim Dressing: PolyMem Silver Non-Adhesive Dressing, 4.25x4.25 in [facility]) Every Other Day/15 Days ary Discharge Instructions: Apply to wound bed as instructed Secondary Dressing: Woven Gauze Sponge, Non-Sterile 4x4 in [facility]) Every Other Day/15 Days Discharge Instructions: Apply over primary dressing as directed. Secondary Dressing: Felt 2.5 yds x 5.5 in [facility]) Every Other Day/15 Days Discharge Instructions: Apply felt or foam donut over primary dressing as directed. Secured With: [employer], 4.5x3.1 (in/yd) [facility]) Every Other Day/15 Days Discharge Instructions: Secure with Kerlix as directed. Secured With: Paper T ape, 2x10 (in/yd) (Home Health) Every Other Day/15 Days Discharge Instructions: Secure dressing  with tape as directed. #1 I continued with polymen Ag on the right foot this is not resulted in any healing and the there has not been any contraction of this wound in years. 2. With regards to the transmetatarsal amputation site areas on the left I did not look at this however I advised him that if he really wants Cory second opinion he should go to [facility]. Probably the best way to get into this system is go back to the wound care clinic there they can certainly get orthopedics to look at this. 3. The area on the right foot that we have been dealing with was also present on the left. Never really got Cory firm diagnosis of this. The only thing that made this somewhat better  in the long period of time I have known him was Cory total contact cast however we have not been able to do this because of wounds on the left 4. I have referred him to [facility] for second opinions about the wound on the left transmetatarsal amputation site at his request. I would not be at all offended if they look at the area on the right foot as well. There is Cory lot of data done at [facility] dermatology and I believe plastic surgery with regards to this type of wound. He has had multiple biopsies which have not been diagnostic. Nevertheless the area on the right foot has not changed that much in years Electronic Signature(s) Signed: 12/18/2020 4:36:20 PM By: [name] MD Entered By: [name] on 12/18/2020 13:22:35 -------------------------------------------------------------------------------- SuperBill Details Patient Name: Date of Service: Cory DA MS, [name] 12/18/2020 Medical Record Number: [id number] Patient Account Number: [id number] Date of Birth/Sex: Treating RN: 26-Mar-1956 (65 y.o. 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

## 2020-12-19 DIAGNOSIS — J449 Chronic obstructive pulmonary disease, unspecified: Secondary | ICD-10-CM | POA: Diagnosis not present

## 2020-12-19 DIAGNOSIS — M86672 Other chronic osteomyelitis, left ankle and foot: Secondary | ICD-10-CM | POA: Diagnosis not present

## 2020-12-19 DIAGNOSIS — K5792 Diverticulitis of intestine, part unspecified, without perforation or abscess without bleeding: Secondary | ICD-10-CM | POA: Diagnosis not present

## 2020-12-19 DIAGNOSIS — G9009 Other idiopathic peripheral autonomic neuropathy: Secondary | ICD-10-CM | POA: Diagnosis not present

## 2020-12-19 DIAGNOSIS — T8781 Dehiscence of amputation stump: Secondary | ICD-10-CM | POA: Diagnosis not present

## 2020-12-19 DIAGNOSIS — I129 Hypertensive chronic kidney disease with stage 1 through stage 4 chronic kidney disease, or unspecified chronic kidney disease: Secondary | ICD-10-CM | POA: Diagnosis not present

## 2020-12-19 DIAGNOSIS — N189 Chronic kidney disease, unspecified: Secondary | ICD-10-CM | POA: Diagnosis not present

## 2020-12-19 DIAGNOSIS — L97518 Non-pressure chronic ulcer of other part of right foot with other specified severity: Secondary | ICD-10-CM | POA: Diagnosis not present

## 2020-12-19 DIAGNOSIS — T8744 Infection of amputation stump, left lower extremity: Secondary | ICD-10-CM | POA: Diagnosis not present

## 2020-12-22 ENCOUNTER — Encounter: Payer: Self-pay | Admitting: Physician Assistant

## 2020-12-22 ENCOUNTER — Ambulatory Visit (INDEPENDENT_AMBULATORY_CARE_PROVIDER_SITE_OTHER): Payer: Medicare PPO | Admitting: Physician Assistant

## 2020-12-22 DIAGNOSIS — T8781 Dehiscence of amputation stump: Secondary | ICD-10-CM

## 2020-12-22 MED ORDER — OXYCODONE HCL 5 MG PO TABS: 5.0000 mg | ORAL_TABLET | Freq: Four times a day (QID) | ORAL | 0 refills | Status: DC | PRN

## 2020-12-22 NOTE — Progress Notes (Signed)
Office Visit Note   Patient: [name].           Date of Birth: [birth year]           MRN: [id number] Visit Date: 12/22/2020              Requested by: [name], MD [address] [place],  [place] [address] PCP: [name], MD  Chief Complaint  Patient presents with  . Left Foot - Routine Post Op    10/08/20 revision left trasnsmet amputation       HPI: Patient is status post left transmetatarsal amputation and revision. He has had gross dehiscence and exposed bone to the amputation site. At his last visit last week he is agreed to go forward with a left transtibial amputation but cannot do this until February 4. In the meantime he is on doxycycline and doing daily dressing changes. He feels it has been about the same he otherwise feels well  Assessment & Plan: Visit Diagnoses: No diagnosis found.  Plan: Continue with dressing changes and doxycycline. We will plan for surgery on February 4. He understands if he has any increasing drainage pain fever chills malaise he is to go to the emergency room and we would do a emergent amputation. Reviewed the risks and surgical outcomes and expectations he will go forward on the fourth  Follow-Up Instructions: No follow-ups on file.   Ortho Exam  Patient is alert, oriented, no adenopathy, well-dressed, normal affect, normal respiratory effort. Left metatarsal amputation is grossly dehisced from the dorsal to the plantar surface he does have mixed fibrinous tissue with some healthy granular tissue. However there is probes to bone and much of the plantar surface has been destroyed and dehisced. He does have some drainage. No ascending cellulitis. Heart regular rate and rhythm lungs clear  Imaging: No results found. No images are attached to the encounter.  Labs: Lab Results  Component Value Date   HGBA1C 6.0 (H) 08/23/2017   HGBA1C 5.3 03/12/2014   HGBA1C 5.4 07/04/2012   ESRSEDRATE 14 06/14/2017   ESRSEDRATE 30  05/17/2017   ESRSEDRATE 23 (H) 01/31/2015   CRP 23.4 (H) 06/14/2017   CRP 37.4 (H) 05/17/2017   CRP 55.8 (H) 03/29/2017   REPTSTATUS 09/17/2010 FINAL 08/03/2010   GRAMSTAIN  08/02/2010    FEW WBC PRESENT, PREDOMINANTLY PMN NO SQUAMOUS EPITHELIAL CELLS SEEN RARE GRAM NEGATIVE RODS   CULT NO ACID FAST BACILLI ISOLATED IN 6 WEEKS 08/03/2010     Lab Results  Component Value Date   ALBUMIN 4.0 03/12/2014   ALBUMIN 3.0 (L) 07/04/2012   ALBUMIN 1.7 (L) 08/02/2010   PREALBUMIN 4.3 (L) 08/02/2010    Lab Results  Component Value Date   MG 2.4 07/04/2012   MG 2.2 08/01/2010   No results found for: VD25OH  Lab Results  Component Value Date   PREALBUMIN 4.3 (L) 08/02/2010   CBC EXTENDED Latest Ref Rng & Units 10/08/2020 05/30/2020 01/02/2018  WBC 4.0 - 10.5 K/uL 8.7 9.0 12.4(H)  RBC 4.22 - 5.81 MIL/uL 4.84 4.67 4.16(L)  HGB 13.0 - 17.0 g/dL 14.4 13.6 12.5(L)  HCT 39.0 - 52.0 % 46.9 43.3 38.4(L)  PLT 150 - 400 K/uL 323 445(H) 223  NEUTROABS 1.4 - 7.0 x10E3/uL - - -  LYMPHSABS 0.7 - 3.1 x10E3/uL - - -     There is no height or weight on file to calculate BMI.  Orders:  No orders of the defined types were placed in  this encounter.  Meds ordered this encounter  Medications  . oxyCODONE (OXY IR/ROXICODONE) 5 MG immediate release tablet    Sig: Take 1 tablet (5 mg total) by mouth every 6 (six) hours as needed for moderate pain (pain score 4-6).    Dispense:  20 tablet    Refill:  0     Procedures: No procedures performed  Clinical Data: No additional findings.  ROS:  All other systems negative, except as noted in the HPI. Review of Systems  Objective: Vital Signs: There were no vitals taken for this visit.  Specialty Comments:  No specialty comments available.  PMFS History: Patient Active Problem List   Diagnosis Date Noted  . Dehiscence of amputation stump ([facility])   . Abscess of left foot 05/30/2020  . Subacute osteomyelitis, left ankle and foot ([facility])   . Skin  ulcers of both feet ([facility]) 10/24/2017  . Colovesical fistula   . Benign neoplasm of cecum   . Chronic venous insufficiency 07/20/2017  . Chronic left-sided low back pain without sciatica 11/08/2016  . Toe ulcer ([facility]) 03/08/2016  . Verruca 12/28/2015  . Pre-ulcerative calluses 12/28/2015  . History of adenomatous polyp of colon 06/09/2015  . Lipoma 03/13/2014  . Plantar warts 07/09/2012  . Cellulitis 07/04/2012  . Neuropathy 07/04/2012  . Hyperglycemia 07/04/2012  . HYPERTENSION, BENIGN 10/20/2010  . EDEMA 08/20/2010  . WISDOM TEETH EXTRACTION, HX OF 08/20/2010  . HYPOALBUMINEMIA 08/19/2010  . ANEMIA 08/19/2010  . NICOTINE ADDICTION 08/19/2010  . NECROTIZING PNEUMONIA 08/19/2010  . OSTEOARTHRITIS 08/19/2010   Past Medical History:  Diagnosis Date  . Arthritis   . Cancer ([facility])    kidney  . Chronic kidney disease    mass r kidney - partial nephrectomy  . COPD (chronic obstructive pulmonary disease) ([facility])   . Diverticulitis   . History of kidney stones   . Hx of adenomatous colonic polyps 06/09/2015  . Hyperlipidemia   . Hypertension    has previously been on medication but then taken off - 05/29/20 pt states he's never been treated for HTN  . Neuromuscular disorder ([facility])    neuropathy lower legs and feet  . Neuropathy    in both feet  . Pneumonia 2011    Family History  Problem Relation Age of Onset  . Heart disease Mother   . Esophageal cancer Mother   . Diabetes Father   . Pancreatic cancer Father   . Breast cancer Sister   . Colon cancer Neg Hx   . Rectal cancer Neg Hx   . Stomach cancer Neg Hx   . Colon polyps Neg Hx     Past Surgical History:  Procedure Laterality Date  . AMPUTATION Bilateral 06/05/2013   Procedure: RIGHT GREAT TOE AMPUTATION/LEFT GREAT TOE DEBRIDEMENT;  Surgeon: Wylene Simmer, MD;  Location: [facility];  Service: Orthopedics;  Laterality: Bilateral;  . AMPUTATION Left 05/30/2020   Procedure: LEFT TRANSMETATARSAL AMPUTATION;  Surgeon: Newt Minion,  MD;  Location: [facility];  Service: Orthopedics;  Laterality: Left;  . APPLICATION OF WOUND VAC Left 05/30/2020   Procedure: APPLICATION OF WOUND VAC;  Surgeon: Newt Minion, MD;  Location: [facility];  Service: Orthopedics;  Laterality: Left;  . COLON SURGERY     robotic partial colectomy Dr. Marcello Moores 12-30-17  . COLONOSCOPY     x 2 last date 09/21/2017 bladder attached to colon eval  . COLONOSCOPY WITH PROPOFOL N/A 09/21/2017   Procedure: COLONOSCOPY WITH PROPOFOL;  Surgeon: [name], MD;  Location: [facility];  Service: Endoscopy;  Laterality: N/A;  . MOUTH SURGERY    . PARTIAL NEPHRECTOMY     right Dr. Leighton Ruff [id number]  . ROBOTIC ASSITED PARTIAL NEPHRECTOMY Right 12/30/2017   Procedure: XI ROBOTIC ASSITED RIGHT PARTIAL NEPHRECTOMY;  Surgeon: Alexis Frock, MD;  Location: [facility];  Service: Urology;  Laterality: Right;  . STUMP REVISION Left 10/08/2020   Procedure: REVISION LEFT TRANSMETATARSAL AMPUTATION;  Surgeon: Newt Minion, MD;  Location: [facility];  Service: Orthopedics;  Laterality: Left;  . TOE AMPUTATION Right 06/05/2013   RIGHT GREAT TOE PARTIAL AMPUTATION    . TOOTH EXTRACTION  2011   multiple teeth extracted   Social History   Occupational History  . Occupation: retired  Tobacco Use  . Smoking status: Current Some Day Smoker    Packs/day: 0.20    Years: 43.00    Pack years: 8.60    Types: Cigarettes    Start date: 11/29/1973  . Smokeless tobacco: Never Used  . Tobacco comment: occasional smoker - ~1 pack/week. smoked ~1.5ppd until 5 yr ago  Vaping Use  . Vaping Use: Never used  Substance and Sexual Activity  . Alcohol use: No    Alcohol/week: 0.0 standard drinks  . Drug use: No  . Sexual activity: Not Currently    Partners: Female

## 2020-12-23 DIAGNOSIS — E11621 Type 2 diabetes mellitus with foot ulcer: Secondary | ICD-10-CM | POA: Diagnosis not present

## 2020-12-23 DIAGNOSIS — Z89432 Acquired absence of left foot: Secondary | ICD-10-CM | POA: Diagnosis not present

## 2020-12-23 DIAGNOSIS — L97423 Non-pressure chronic ulcer of left heel and midfoot with necrosis of muscle: Secondary | ICD-10-CM | POA: Diagnosis not present

## 2020-12-23 DIAGNOSIS — L97518 Non-pressure chronic ulcer of other part of right foot with other specified severity: Secondary | ICD-10-CM | POA: Diagnosis not present

## 2020-12-24 DIAGNOSIS — T8744 Infection of amputation stump, left lower extremity: Secondary | ICD-10-CM | POA: Diagnosis not present

## 2020-12-24 DIAGNOSIS — I129 Hypertensive chronic kidney disease with stage 1 through stage 4 chronic kidney disease, or unspecified chronic kidney disease: Secondary | ICD-10-CM | POA: Diagnosis not present

## 2020-12-24 DIAGNOSIS — S91302A Unspecified open wound, left foot, initial encounter: Secondary | ICD-10-CM | POA: Diagnosis not present

## 2020-12-24 DIAGNOSIS — J449 Chronic obstructive pulmonary disease, unspecified: Secondary | ICD-10-CM | POA: Diagnosis not present

## 2020-12-24 DIAGNOSIS — L97518 Non-pressure chronic ulcer of other part of right foot with other specified severity: Secondary | ICD-10-CM | POA: Diagnosis not present

## 2020-12-24 DIAGNOSIS — G9009 Other idiopathic peripheral autonomic neuropathy: Secondary | ICD-10-CM | POA: Diagnosis not present

## 2020-12-24 DIAGNOSIS — T8781 Dehiscence of amputation stump: Secondary | ICD-10-CM | POA: Diagnosis not present

## 2020-12-24 DIAGNOSIS — T8789 Other complications of amputation stump: Secondary | ICD-10-CM | POA: Diagnosis not present

## 2020-12-24 DIAGNOSIS — M86672 Other chronic osteomyelitis, left ankle and foot: Secondary | ICD-10-CM | POA: Diagnosis not present

## 2020-12-24 DIAGNOSIS — K5792 Diverticulitis of intestine, part unspecified, without perforation or abscess without bleeding: Secondary | ICD-10-CM | POA: Diagnosis not present

## 2020-12-24 DIAGNOSIS — N189 Chronic kidney disease, unspecified: Secondary | ICD-10-CM | POA: Diagnosis not present

## 2020-12-26 DIAGNOSIS — T8744 Infection of amputation stump, left lower extremity: Secondary | ICD-10-CM | POA: Diagnosis not present

## 2020-12-26 DIAGNOSIS — G9009 Other idiopathic peripheral autonomic neuropathy: Secondary | ICD-10-CM | POA: Diagnosis not present

## 2020-12-26 DIAGNOSIS — L97423 Non-pressure chronic ulcer of left heel and midfoot with necrosis of muscle: Secondary | ICD-10-CM | POA: Diagnosis not present

## 2020-12-26 DIAGNOSIS — M86672 Other chronic osteomyelitis, left ankle and foot: Secondary | ICD-10-CM | POA: Diagnosis not present

## 2020-12-26 DIAGNOSIS — I129 Hypertensive chronic kidney disease with stage 1 through stage 4 chronic kidney disease, or unspecified chronic kidney disease: Secondary | ICD-10-CM | POA: Diagnosis not present

## 2020-12-26 DIAGNOSIS — Z89432 Acquired absence of left foot: Secondary | ICD-10-CM | POA: Diagnosis not present

## 2020-12-26 DIAGNOSIS — T8781 Dehiscence of amputation stump: Secondary | ICD-10-CM | POA: Diagnosis not present

## 2020-12-26 DIAGNOSIS — J449 Chronic obstructive pulmonary disease, unspecified: Secondary | ICD-10-CM | POA: Diagnosis not present

## 2020-12-26 DIAGNOSIS — S91302A Unspecified open wound, left foot, initial encounter: Secondary | ICD-10-CM | POA: Diagnosis not present

## 2020-12-26 DIAGNOSIS — K5792 Diverticulitis of intestine, part unspecified, without perforation or abscess without bleeding: Secondary | ICD-10-CM | POA: Diagnosis not present

## 2020-12-26 DIAGNOSIS — E11621 Type 2 diabetes mellitus with foot ulcer: Secondary | ICD-10-CM | POA: Diagnosis not present

## 2020-12-26 DIAGNOSIS — E08621 Diabetes mellitus due to underlying condition with foot ulcer: Secondary | ICD-10-CM | POA: Diagnosis not present

## 2020-12-26 DIAGNOSIS — T8789 Other complications of amputation stump: Secondary | ICD-10-CM | POA: Diagnosis not present

## 2020-12-26 DIAGNOSIS — N189 Chronic kidney disease, unspecified: Secondary | ICD-10-CM | POA: Diagnosis not present

## 2020-12-26 DIAGNOSIS — L97518 Non-pressure chronic ulcer of other part of right foot with other specified severity: Secondary | ICD-10-CM | POA: Diagnosis not present

## 2020-12-29 ENCOUNTER — Other Ambulatory Visit: Payer: Self-pay | Admitting: Physician Assistant

## 2020-12-29 DIAGNOSIS — M86672 Other chronic osteomyelitis, left ankle and foot: Secondary | ICD-10-CM | POA: Diagnosis not present

## 2020-12-29 DIAGNOSIS — G9009 Other idiopathic peripheral autonomic neuropathy: Secondary | ICD-10-CM | POA: Diagnosis not present

## 2020-12-29 DIAGNOSIS — N189 Chronic kidney disease, unspecified: Secondary | ICD-10-CM | POA: Diagnosis not present

## 2020-12-29 DIAGNOSIS — I129 Hypertensive chronic kidney disease with stage 1 through stage 4 chronic kidney disease, or unspecified chronic kidney disease: Secondary | ICD-10-CM | POA: Diagnosis not present

## 2020-12-29 DIAGNOSIS — J449 Chronic obstructive pulmonary disease, unspecified: Secondary | ICD-10-CM | POA: Diagnosis not present

## 2020-12-29 DIAGNOSIS — T8781 Dehiscence of amputation stump: Secondary | ICD-10-CM | POA: Diagnosis not present

## 2020-12-29 DIAGNOSIS — T8744 Infection of amputation stump, left lower extremity: Secondary | ICD-10-CM | POA: Diagnosis not present

## 2020-12-29 DIAGNOSIS — L97518 Non-pressure chronic ulcer of other part of right foot with other specified severity: Secondary | ICD-10-CM | POA: Diagnosis not present

## 2020-12-29 DIAGNOSIS — K5792 Diverticulitis of intestine, part unspecified, without perforation or abscess without bleeding: Secondary | ICD-10-CM | POA: Diagnosis not present

## 2020-12-30 ENCOUNTER — Other Ambulatory Visit: Payer: Self-pay

## 2020-12-30 DIAGNOSIS — Z89432 Acquired absence of left foot: Secondary | ICD-10-CM | POA: Diagnosis not present

## 2020-12-30 DIAGNOSIS — E11621 Type 2 diabetes mellitus with foot ulcer: Secondary | ICD-10-CM | POA: Diagnosis not present

## 2020-12-30 DIAGNOSIS — L97423 Non-pressure chronic ulcer of left heel and midfoot with necrosis of muscle: Secondary | ICD-10-CM | POA: Diagnosis not present

## 2020-12-30 DIAGNOSIS — L97522 Non-pressure chronic ulcer of other part of left foot with fat layer exposed: Secondary | ICD-10-CM | POA: Diagnosis not present

## 2021-01-01 MED ORDER — DEXTROSE 5 % IV SOLN
3.0000 g | INTRAVENOUS | Status: AC
  Filled 2021-01-01 (×3): qty 3000

## 2021-01-01 NOTE — Progress Notes (Signed)
[name] has been called numerous times in the 4 days, voice messages have been left.  I called today and the message said : "unable to take thst this time.." I called an emergency contact, [name]., sking her to call me to llet me know how to reach [name].  [name]'s cell phone rang busy, I left a  voice message on her home phone.  I called [name] , Dr. Jess Barters scheduler, I left a voice message informing her that [name] told pharmacy tech that surgery date is not on 01/02/21, I asked [name] to call me back with any information.

## 2021-01-02 ENCOUNTER — Inpatient Hospital Stay (HOSPITAL_COMMUNITY): Admission: RE | Admit: 2021-01-02 | Payer: Medicare PPO | Source: Home / Self Care | Admitting: Orthopedic Surgery

## 2021-01-02 DIAGNOSIS — E08621 Diabetes mellitus due to underlying condition with foot ulcer: Secondary | ICD-10-CM | POA: Diagnosis not present

## 2021-01-02 DIAGNOSIS — T8781 Dehiscence of amputation stump: Secondary | ICD-10-CM | POA: Diagnosis not present

## 2021-01-02 DIAGNOSIS — L97423 Non-pressure chronic ulcer of left heel and midfoot with necrosis of muscle: Secondary | ICD-10-CM | POA: Diagnosis not present

## 2021-01-02 DIAGNOSIS — K5792 Diverticulitis of intestine, part unspecified, without perforation or abscess without bleeding: Secondary | ICD-10-CM | POA: Diagnosis not present

## 2021-01-02 DIAGNOSIS — L97518 Non-pressure chronic ulcer of other part of right foot with other specified severity: Secondary | ICD-10-CM | POA: Diagnosis not present

## 2021-01-02 DIAGNOSIS — I129 Hypertensive chronic kidney disease with stage 1 through stage 4 chronic kidney disease, or unspecified chronic kidney disease: Secondary | ICD-10-CM | POA: Diagnosis not present

## 2021-01-02 DIAGNOSIS — E11621 Type 2 diabetes mellitus with foot ulcer: Secondary | ICD-10-CM | POA: Diagnosis not present

## 2021-01-02 DIAGNOSIS — G9009 Other idiopathic peripheral autonomic neuropathy: Secondary | ICD-10-CM | POA: Diagnosis not present

## 2021-01-02 DIAGNOSIS — Z89432 Acquired absence of left foot: Secondary | ICD-10-CM | POA: Diagnosis not present

## 2021-01-02 DIAGNOSIS — J449 Chronic obstructive pulmonary disease, unspecified: Secondary | ICD-10-CM | POA: Diagnosis not present

## 2021-01-02 DIAGNOSIS — N189 Chronic kidney disease, unspecified: Secondary | ICD-10-CM | POA: Diagnosis not present

## 2021-01-02 DIAGNOSIS — M86672 Other chronic osteomyelitis, left ankle and foot: Secondary | ICD-10-CM | POA: Diagnosis not present

## 2021-01-02 DIAGNOSIS — T8744 Infection of amputation stump, left lower extremity: Secondary | ICD-10-CM | POA: Diagnosis not present

## 2021-01-02 SURGERY — AMPUTATION BELOW KNEE
Anesthesia: Choice | Site: Knee | Laterality: Left

## 2021-01-06 DIAGNOSIS — J449 Chronic obstructive pulmonary disease, unspecified: Secondary | ICD-10-CM | POA: Diagnosis not present

## 2021-01-06 DIAGNOSIS — T8744 Infection of amputation stump, left lower extremity: Secondary | ICD-10-CM | POA: Diagnosis not present

## 2021-01-06 DIAGNOSIS — G9009 Other idiopathic peripheral autonomic neuropathy: Secondary | ICD-10-CM | POA: Diagnosis not present

## 2021-01-06 DIAGNOSIS — K5792 Diverticulitis of intestine, part unspecified, without perforation or abscess without bleeding: Secondary | ICD-10-CM | POA: Diagnosis not present

## 2021-01-06 DIAGNOSIS — M86672 Other chronic osteomyelitis, left ankle and foot: Secondary | ICD-10-CM | POA: Diagnosis not present

## 2021-01-06 DIAGNOSIS — N189 Chronic kidney disease, unspecified: Secondary | ICD-10-CM | POA: Diagnosis not present

## 2021-01-06 DIAGNOSIS — L97518 Non-pressure chronic ulcer of other part of right foot with other specified severity: Secondary | ICD-10-CM | POA: Diagnosis not present

## 2021-01-06 DIAGNOSIS — E11621 Type 2 diabetes mellitus with foot ulcer: Secondary | ICD-10-CM | POA: Diagnosis not present

## 2021-01-06 DIAGNOSIS — T8781 Dehiscence of amputation stump: Secondary | ICD-10-CM | POA: Diagnosis not present

## 2021-01-06 DIAGNOSIS — Z89432 Acquired absence of left foot: Secondary | ICD-10-CM | POA: Diagnosis not present

## 2021-01-06 DIAGNOSIS — L97423 Non-pressure chronic ulcer of left heel and midfoot with necrosis of muscle: Secondary | ICD-10-CM | POA: Diagnosis not present

## 2021-01-06 DIAGNOSIS — I129 Hypertensive chronic kidney disease with stage 1 through stage 4 chronic kidney disease, or unspecified chronic kidney disease: Secondary | ICD-10-CM | POA: Diagnosis not present

## 2021-01-06 NOTE — Progress Notes (Signed)
NASH, BOLLS ([id number]) Visit Report for 12/18/2020 Arrival Information Details Patient Name: Date of Service: [name], [name] 12/18/2020 11:00 A M Medical Record Number: [id number] Patient Account Number: [id number] Date of Birth/Sex: Treating RN: 1956-10-19 (65 y.o. [name] Primary Care Alyannah Sanks: [name] Other Clinician: Referring Netanel Yannuzzi: Treating Lonie Newsham/Extender: [name] in Treatment: 26 Visit Information History Since Last Visit Added or deleted any medications: No Patient Arrived: Crutches Any new allergies or adverse reactions: No Arrival Time: 11:22 Had a fall or experienced change in No Accompanied By: alone activities of daily living that may affect Transfer Assistance: None risk of falls: Patient Identification Verified: Yes Signs or symptoms of abuse/neglect since last visito No Secondary Verification Process Completed: Yes Hospitalized since last visit: No Patient Requires Transmission-Based Precautions: No Implantable device outside of the clinic excluding No Patient Has Alerts: No cellular tissue based products placed in the center since last visit: Has Dressing in Place as Prescribed: Yes Pain Present Now: No Electronic Signature(s) Signed: 01/06/2021 4:02:54 PM By: Levan Hurst RN, BSN Entered By: Levan Hurst on 12/18/2020 11:26:23 -------------------------------------------------------------------------------- Lower Extremity Assessment Details Patient Name: Date of Service: [name], [name] 12/18/2020 11:00 A M Medical Record Number: [id number] Patient Account Number: [id number] Date of Birth/Sex: Treating RN: 1956-04-25 (65 y.o. [name] Primary Care Aram Domzalski: [name] Other Clinician: Referring Laykin Rainone: Treating Octa Uplinger/Extender: [name] in Treatment: 26 Edema Assessment Assessed: [Left: No] [Right: No] Edema: [Left: N] [Right:  o] Calf Left: Right: Point of Measurement: From Medial Instep 43.5 cm Ankle Left: Right: Point of Measurement: From Medial Instep 27 cm Vascular Assessment Pulses: Dorsalis Pedis Palpable: [Right:Yes] Electronic Signature(s) Signed: 01/06/2021 4:02:54 PM By: Levan Hurst RN, BSN Entered By: Levan Hurst on 12/18/2020 11:33:16 -------------------------------------------------------------------------------- Multi Wound Chart Details Patient Name: Date of Service: [name], [name] 12/18/2020 11:00 A M Medical Record Number: [id number] Patient Account Number: [id number] Date of Birth/Sex: Treating RN: [birth year] (65 y.o. [name] Primary Care Olegario Emberson: [name] Other Clinician: Referring Glendene Wyer: Treating Aishah Teffeteller/Extender: [name] in Treatment: 26 Vital Signs Height(in): 56 Pulse(bpm): 32 Weight(lbs): 350 Blood Pressure(mmHg): 157/95 Body Mass Index(BMI): 38 Temperature(F): 98.3 Respiratory Rate(breaths/min): 18 Photos: [12:No Photos Right Metatarsal head first] [N/A:N/A N/A] Wound Location: [12:Gradually Appeared] [N/A:N/A] Wounding Event: [12:Neuropathic Ulcer-Non Diabetic] [N/A:N/A] Primary Etiology: [12:Anemia, Chronic Obstructive] [N/A:N/A] Comorbid History: [12:Pulmonary Disease (COPD), Hypertension, Osteoarthritis, Osteomyelitis, Neuropathy 11/29/2018] [N/A:N/A] Date Acquired: [12:26] [N/A:N/A] Weeks of Treatment: [12:Open] [N/A:N/A] Wound Status: [12:3.7x2.5x1] [N/A:N/A] Measurements L x W x D (cm) [12:7.265] [N/A:N/A] A (cm) : rea [12:7.265] [N/A:N/A] Volume (cm) : [12:22.10%] [N/A:N/A] % Reduction in A [12:rea: -289.30%] [N/A:N/A] % Reduction in Volume: [12:Full Thickness Without Exposed] [N/A:N/A] Classification: [12:Support Structures Medium] [N/A:N/A] Exudate A mount: [12:Serosanguineous] [N/A:N/A] Exudate Type: [12:red, brown] [N/A:N/A] Exudate Color: [12:Thickened] [N/A:N/A] Wound Margin: [12:Medium  (34-66%)] [N/A:N/A] Granulation A mount: [12:Red, Pink] [N/A:N/A] Granulation Quality: [12:Medium (34-66%)] [N/A:N/A] Necrotic A mount: [12:Eschar, Adherent Slough] [N/A:N/A] Necrotic Tissue: [12:Fat Layer (Subcutaneous Tissue): Yes N/A] Exposed Structures: [12:Fascia: No Tendon: No Muscle: No Joint: No Bone: No None] [N/A:N/A] Epithelialization: [12:Debridement - Selective/Open Wound N/A] Debridement: Pre-procedure Verification/Time Out 12:05 [N/A:N/A] Taken: [12:Lidocaine 4% Topical Solution] [N/A:N/A] Pain Control: [12:Callus] [N/A:N/A] Tissue Debrided: [12:Non-Viable Tissue] [N/A:N/A] Level: [12:12] [N/A:N/A] Debridement A (sq cm): [12:rea Blade] [N/A:N/A] Instrument: [12:Minimum] [N/A:N/A] Bleeding: [12:0] [N/A:N/A] Procedural Pain: [12:0] [N/A:N/A] Post Procedural Pain: [12:Procedure was tolerated well] [N/A:N/A] Debridement Treatment Response: [12:3.7x2.5x0.8] [N/A:N/A]  Post Debridement Measurements L x W x D (cm) [12:5.812] [N/A:N/A] Post Debridement Volume: (cm) [12:Debridement] [N/A:N/A] Procedures Performed: Treatment Notes Electronic Signature(s) Signed: 12/18/2020 4:36:20 PM By: [name] MD Signed: 12/18/2020 4:56:08 PM By: Deon Pilling Entered By: [name] on 12/18/2020 13:17:03 -------------------------------------------------------------------------------- Multi-Disciplinary Care Plan Details Patient Name: Date of Service: [name], [name] 12/18/2020 11:00 A M Medical Record Number: [id number] Patient Account Number: [id number] Date of Birth/Sex: Treating RN: 1956/03/23 (65 y.o. [name] Primary Care Jerrico Covello: [name] Other Clinician: Referring Jillian Warth: Treating Hilaria Titsworth/Extender: [name] in Treatment: 26 Active Inactive Wound/Skin Impairment Nursing Diagnoses: Impaired tissue integrity Knowledge deficit related to ulceration/compromised skin integrity Goals: Patient/caregiver will  verbalize understanding of skin care regimen Date Initiated: 06/17/2020 Target Resolution Date: 01/23/2021 Goal Status: Active Ulcer/skin breakdown will have a volume reduction of 30% by week 4 Date Initiated: 06/17/2020 Date Inactivated: 07/21/2020 Target Resolution Date: 07/18/2020 Goal Status: Met Interventions: Assess patient/caregiver ability to obtain necessary supplies Assess patient/caregiver ability to perform ulcer/skin care regimen upon admission and as needed Assess ulceration(s) every visit Provide education on ulcer and skin care Notes: Electronic Signature(s) Signed: 12/18/2020 4:56:08 PM By: Deon Pilling Entered By: Deon Pilling on 12/18/2020 11:45:12 -------------------------------------------------------------------------------- Pain Assessment Details Patient Name: Date of Service: [name], [name] 12/18/2020 11:00 A M Medical Record Number: [id number] Patient Account Number: [id number] Date of Birth/Sex: Treating RN: [birth year] (65 y.o. [name] Primary Care Auda Finfrock: [name] Other Clinician: Referring Latrica Clowers: Treating Essance Gatti/Extender: [name] in Treatment: 26 Active Problems Location of Pain Severity and Description of Pain Patient Has Paino No Site Locations Pain Management and Medication Current Pain Management: Electronic Signature(s) Signed: 01/06/2021 4:02:54 PM By: Levan Hurst RN, BSN Entered By: Levan Hurst on 12/18/2020 11:26:43 -------------------------------------------------------------------------------- Patient/Caregiver Education Details Patient Name: Date of Service: [name], [name] 1/20/2022andnbsp11:00 A M Medical Record Number: [id number] Patient Account Number: [id number] Date of Birth/Gender: Treating RN: 1956-01-11 (65 y.o. [name] Primary Care Physician: [name] Other Clinician: Referring Physician: Treating Physician/Extender: [name] in Treatment: 26 Education Assessment Education Provided To: Patient Education Topics Provided Wound/Skin Impairment: Handouts: Caring for Your Ulcer, Skin Care Do's and Dont's Methods: Explain/Verbal Responses: Reinforcements needed Electronic Signature(s) Signed: 12/18/2020 4:56:08 PM By: Deon Pilling Entered By: Deon Pilling on 12/18/2020 11:46:45 -------------------------------------------------------------------------------- Wound Assessment Details Patient Name: Date of Service: [name], [name] 12/18/2020 11:00 A M Medical Record Number: [id number] Patient Account Number: [id number] Date of Birth/Sex: Treating RN: 07/24/1956 (65 y.o. [name] Primary Care Idelia Caudell: [name] Other Clinician: Referring Jaydien Panepinto: Treating Zephaniah Enyeart/Extender: [name] in Treatment: 26 Wound Status Wound Number: 12 Primary Neuropathic Ulcer-Non Diabetic Etiology: Wound Location: Right Metatarsal head first Wound Open Wounding Event: Gradually Appeared Status: Date Acquired: 11/29/2018 Comorbid Anemia, Chronic Obstructive Pulmonary Disease (COPD), Weeks Of Treatment: 26 History: Hypertension, Osteoarthritis, Osteomyelitis, Neuropathy Clustered Wound: No Photos Photo Uploaded By: Mikeal Hawthorne on 12/24/2020 11:24:16 Wound Measurements Length: (cm) 3.7 Width: (cm) 2.5 Depth: (cm) 1 Area: (cm) 7.265 Volume: (cm) 7.265 % Reduction in Area: 22.1% % Reduction in Volume: -289.3% Epithelialization: None Tunneling: No Undermining: No Wound Description Classification: Full Thickness Without Exposed Support Structures Wound Margin: Thickened Exudate Amount: Medium Exudate Type: Serosanguineous Exudate Color: red, brown Foul Odor After Cleansing: No Slough/Fibrino Yes Wound Bed Granulation Amount: Medium (34-66%) Exposed Structure Granulation Quality: Red, Pink Fascia Exposed: No Necrotic Amount:  Medium  (34-66%) Fat Layer (Subcutaneous Tissue) Exposed: Yes Necrotic Quality: Eschar, Adherent Slough Tendon Exposed: No Muscle Exposed: No Joint Exposed: No Bone Exposed: No Electronic Signature(s) Signed: 01/06/2021 4:02:54 PM By: Levan Hurst RN, BSN Entered By: Levan Hurst on 12/18/2020 11:33:01 -------------------------------------------------------------------------------- Vitals Details Patient Name: Date of Service: [name], [name] 12/18/2020 11:00 A M Medical Record Number: [id number] Patient Account Number: [id number] Date of Birth/Sex: Treating RN: [birth year] (65 y.o. [name] Primary Care Norval Slaven: [name] Other Clinician: Referring Kanyon Seibold: Treating Miguelangel Korn/Extender: [name] in Treatment: 26 Vital Signs Time Taken: 11:26 Temperature (F): 98.3 Height (in): 80 Pulse (bpm): 94 Weight (lbs): 350 Respiratory Rate (breaths/min): 18 Body Mass Index (BMI): 38.4 Blood Pressure (mmHg): 157/95 Reference Range: 80 - 120 mg / dl Electronic Signature(s) Signed: 01/06/2021 4:02:54 PM By: Levan Hurst RN, BSN Entered By: Levan Hurst on 12/18/2020 11:26:38

## 2021-01-09 DIAGNOSIS — M86672 Other chronic osteomyelitis, left ankle and foot: Secondary | ICD-10-CM | POA: Diagnosis not present

## 2021-01-09 DIAGNOSIS — N189 Chronic kidney disease, unspecified: Secondary | ICD-10-CM | POA: Diagnosis not present

## 2021-01-09 DIAGNOSIS — J449 Chronic obstructive pulmonary disease, unspecified: Secondary | ICD-10-CM | POA: Diagnosis not present

## 2021-01-09 DIAGNOSIS — T8781 Dehiscence of amputation stump: Secondary | ICD-10-CM | POA: Diagnosis not present

## 2021-01-09 DIAGNOSIS — I129 Hypertensive chronic kidney disease with stage 1 through stage 4 chronic kidney disease, or unspecified chronic kidney disease: Secondary | ICD-10-CM | POA: Diagnosis not present

## 2021-01-09 DIAGNOSIS — T8744 Infection of amputation stump, left lower extremity: Secondary | ICD-10-CM | POA: Diagnosis not present

## 2021-01-09 DIAGNOSIS — L97518 Non-pressure chronic ulcer of other part of right foot with other specified severity: Secondary | ICD-10-CM | POA: Diagnosis not present

## 2021-01-09 DIAGNOSIS — K5792 Diverticulitis of intestine, part unspecified, without perforation or abscess without bleeding: Secondary | ICD-10-CM | POA: Diagnosis not present

## 2021-01-09 DIAGNOSIS — G9009 Other idiopathic peripheral autonomic neuropathy: Secondary | ICD-10-CM | POA: Diagnosis not present

## 2021-01-12 DIAGNOSIS — G9009 Other idiopathic peripheral autonomic neuropathy: Secondary | ICD-10-CM | POA: Diagnosis not present

## 2021-01-12 DIAGNOSIS — J449 Chronic obstructive pulmonary disease, unspecified: Secondary | ICD-10-CM | POA: Diagnosis not present

## 2021-01-12 DIAGNOSIS — I129 Hypertensive chronic kidney disease with stage 1 through stage 4 chronic kidney disease, or unspecified chronic kidney disease: Secondary | ICD-10-CM | POA: Diagnosis not present

## 2021-01-12 DIAGNOSIS — T8744 Infection of amputation stump, left lower extremity: Secondary | ICD-10-CM | POA: Diagnosis not present

## 2021-01-12 DIAGNOSIS — L97518 Non-pressure chronic ulcer of other part of right foot with other specified severity: Secondary | ICD-10-CM | POA: Diagnosis not present

## 2021-01-12 DIAGNOSIS — K5792 Diverticulitis of intestine, part unspecified, without perforation or abscess without bleeding: Secondary | ICD-10-CM | POA: Diagnosis not present

## 2021-01-12 DIAGNOSIS — N189 Chronic kidney disease, unspecified: Secondary | ICD-10-CM | POA: Diagnosis not present

## 2021-01-12 DIAGNOSIS — M86672 Other chronic osteomyelitis, left ankle and foot: Secondary | ICD-10-CM | POA: Diagnosis not present

## 2021-01-12 DIAGNOSIS — T8781 Dehiscence of amputation stump: Secondary | ICD-10-CM | POA: Diagnosis not present

## 2021-01-13 DIAGNOSIS — L97512 Non-pressure chronic ulcer of other part of right foot with fat layer exposed: Secondary | ICD-10-CM | POA: Diagnosis not present

## 2021-01-13 DIAGNOSIS — E11621 Type 2 diabetes mellitus with foot ulcer: Secondary | ICD-10-CM | POA: Diagnosis not present

## 2021-01-13 DIAGNOSIS — L97423 Non-pressure chronic ulcer of left heel and midfoot with necrosis of muscle: Secondary | ICD-10-CM | POA: Diagnosis not present

## 2021-01-13 DIAGNOSIS — E08621 Diabetes mellitus due to underlying condition with foot ulcer: Secondary | ICD-10-CM | POA: Diagnosis not present

## 2021-01-13 DIAGNOSIS — Z89432 Acquired absence of left foot: Secondary | ICD-10-CM | POA: Diagnosis not present

## 2021-01-15 ENCOUNTER — Encounter (HOSPITAL_BASED_OUTPATIENT_CLINIC_OR_DEPARTMENT_OTHER): Payer: Medicare PPO | Admitting: Internal Medicine

## 2021-01-15 DIAGNOSIS — G9009 Other idiopathic peripheral autonomic neuropathy: Secondary | ICD-10-CM | POA: Diagnosis not present

## 2021-01-15 DIAGNOSIS — J449 Chronic obstructive pulmonary disease, unspecified: Secondary | ICD-10-CM | POA: Diagnosis not present

## 2021-01-15 DIAGNOSIS — M86672 Other chronic osteomyelitis, left ankle and foot: Secondary | ICD-10-CM | POA: Diagnosis not present

## 2021-01-15 DIAGNOSIS — I129 Hypertensive chronic kidney disease with stage 1 through stage 4 chronic kidney disease, or unspecified chronic kidney disease: Secondary | ICD-10-CM | POA: Diagnosis not present

## 2021-01-15 DIAGNOSIS — K5792 Diverticulitis of intestine, part unspecified, without perforation or abscess without bleeding: Secondary | ICD-10-CM | POA: Diagnosis not present

## 2021-01-15 DIAGNOSIS — L97518 Non-pressure chronic ulcer of other part of right foot with other specified severity: Secondary | ICD-10-CM | POA: Diagnosis not present

## 2021-01-15 DIAGNOSIS — N189 Chronic kidney disease, unspecified: Secondary | ICD-10-CM | POA: Diagnosis not present

## 2021-01-15 DIAGNOSIS — T8744 Infection of amputation stump, left lower extremity: Secondary | ICD-10-CM | POA: Diagnosis not present

## 2021-01-15 DIAGNOSIS — T8781 Dehiscence of amputation stump: Secondary | ICD-10-CM | POA: Diagnosis not present

## 2021-01-19 DIAGNOSIS — T8744 Infection of amputation stump, left lower extremity: Secondary | ICD-10-CM | POA: Diagnosis not present

## 2021-01-19 DIAGNOSIS — G9009 Other idiopathic peripheral autonomic neuropathy: Secondary | ICD-10-CM | POA: Diagnosis not present

## 2021-01-19 DIAGNOSIS — N189 Chronic kidney disease, unspecified: Secondary | ICD-10-CM | POA: Diagnosis not present

## 2021-01-19 DIAGNOSIS — M86672 Other chronic osteomyelitis, left ankle and foot: Secondary | ICD-10-CM | POA: Diagnosis not present

## 2021-01-19 DIAGNOSIS — I129 Hypertensive chronic kidney disease with stage 1 through stage 4 chronic kidney disease, or unspecified chronic kidney disease: Secondary | ICD-10-CM | POA: Diagnosis not present

## 2021-01-19 DIAGNOSIS — T8781 Dehiscence of amputation stump: Secondary | ICD-10-CM | POA: Diagnosis not present

## 2021-01-19 DIAGNOSIS — K5792 Diverticulitis of intestine, part unspecified, without perforation or abscess without bleeding: Secondary | ICD-10-CM | POA: Diagnosis not present

## 2021-01-19 DIAGNOSIS — L97518 Non-pressure chronic ulcer of other part of right foot with other specified severity: Secondary | ICD-10-CM | POA: Diagnosis not present

## 2021-01-19 DIAGNOSIS — J449 Chronic obstructive pulmonary disease, unspecified: Secondary | ICD-10-CM | POA: Diagnosis not present

## 2021-01-20 DIAGNOSIS — Z89432 Acquired absence of left foot: Secondary | ICD-10-CM | POA: Diagnosis not present

## 2021-01-20 DIAGNOSIS — L97423 Non-pressure chronic ulcer of left heel and midfoot with necrosis of muscle: Secondary | ICD-10-CM | POA: Diagnosis not present

## 2021-01-20 DIAGNOSIS — L97512 Non-pressure chronic ulcer of other part of right foot with fat layer exposed: Secondary | ICD-10-CM | POA: Diagnosis not present

## 2021-01-20 DIAGNOSIS — L97422 Non-pressure chronic ulcer of left heel and midfoot with fat layer exposed: Secondary | ICD-10-CM | POA: Diagnosis not present

## 2021-01-20 DIAGNOSIS — E08621 Diabetes mellitus due to underlying condition with foot ulcer: Secondary | ICD-10-CM | POA: Diagnosis not present

## 2021-01-20 DIAGNOSIS — E11621 Type 2 diabetes mellitus with foot ulcer: Secondary | ICD-10-CM | POA: Diagnosis not present

## 2021-01-23 DIAGNOSIS — J449 Chronic obstructive pulmonary disease, unspecified: Secondary | ICD-10-CM | POA: Diagnosis not present

## 2021-01-23 DIAGNOSIS — T8744 Infection of amputation stump, left lower extremity: Secondary | ICD-10-CM | POA: Diagnosis not present

## 2021-01-23 DIAGNOSIS — M86672 Other chronic osteomyelitis, left ankle and foot: Secondary | ICD-10-CM | POA: Diagnosis not present

## 2021-01-23 DIAGNOSIS — G9009 Other idiopathic peripheral autonomic neuropathy: Secondary | ICD-10-CM | POA: Diagnosis not present

## 2021-01-23 DIAGNOSIS — L97518 Non-pressure chronic ulcer of other part of right foot with other specified severity: Secondary | ICD-10-CM | POA: Diagnosis not present

## 2021-01-23 DIAGNOSIS — T8781 Dehiscence of amputation stump: Secondary | ICD-10-CM | POA: Diagnosis not present

## 2021-01-23 DIAGNOSIS — I129 Hypertensive chronic kidney disease with stage 1 through stage 4 chronic kidney disease, or unspecified chronic kidney disease: Secondary | ICD-10-CM | POA: Diagnosis not present

## 2021-01-23 DIAGNOSIS — N189 Chronic kidney disease, unspecified: Secondary | ICD-10-CM | POA: Diagnosis not present

## 2021-01-23 DIAGNOSIS — K5792 Diverticulitis of intestine, part unspecified, without perforation or abscess without bleeding: Secondary | ICD-10-CM | POA: Diagnosis not present

## 2021-01-26 DIAGNOSIS — N189 Chronic kidney disease, unspecified: Secondary | ICD-10-CM | POA: Diagnosis not present

## 2021-01-26 DIAGNOSIS — J449 Chronic obstructive pulmonary disease, unspecified: Secondary | ICD-10-CM | POA: Diagnosis not present

## 2021-01-26 DIAGNOSIS — L97518 Non-pressure chronic ulcer of other part of right foot with other specified severity: Secondary | ICD-10-CM | POA: Diagnosis not present

## 2021-01-26 DIAGNOSIS — T8744 Infection of amputation stump, left lower extremity: Secondary | ICD-10-CM | POA: Diagnosis not present

## 2021-01-26 DIAGNOSIS — T8781 Dehiscence of amputation stump: Secondary | ICD-10-CM | POA: Diagnosis not present

## 2021-01-26 DIAGNOSIS — M86672 Other chronic osteomyelitis, left ankle and foot: Secondary | ICD-10-CM | POA: Diagnosis not present

## 2021-01-26 DIAGNOSIS — G9009 Other idiopathic peripheral autonomic neuropathy: Secondary | ICD-10-CM | POA: Diagnosis not present

## 2021-01-26 DIAGNOSIS — I129 Hypertensive chronic kidney disease with stage 1 through stage 4 chronic kidney disease, or unspecified chronic kidney disease: Secondary | ICD-10-CM | POA: Diagnosis not present

## 2021-01-26 DIAGNOSIS — K5792 Diverticulitis of intestine, part unspecified, without perforation or abscess without bleeding: Secondary | ICD-10-CM | POA: Diagnosis not present

## 2021-01-27 DIAGNOSIS — E08621 Diabetes mellitus due to underlying condition with foot ulcer: Secondary | ICD-10-CM | POA: Diagnosis not present

## 2021-01-27 DIAGNOSIS — Z89422 Acquired absence of other left toe(s): Secondary | ICD-10-CM | POA: Diagnosis not present

## 2021-01-27 DIAGNOSIS — E11621 Type 2 diabetes mellitus with foot ulcer: Secondary | ICD-10-CM | POA: Diagnosis not present

## 2021-01-27 DIAGNOSIS — Z89411 Acquired absence of right great toe: Secondary | ICD-10-CM | POA: Diagnosis not present

## 2021-01-27 DIAGNOSIS — Z89432 Acquired absence of left foot: Secondary | ICD-10-CM | POA: Diagnosis not present

## 2021-01-27 DIAGNOSIS — L97412 Non-pressure chronic ulcer of right heel and midfoot with fat layer exposed: Secondary | ICD-10-CM | POA: Diagnosis not present

## 2021-01-27 DIAGNOSIS — Z89412 Acquired absence of left great toe: Secondary | ICD-10-CM | POA: Diagnosis not present

## 2021-01-27 DIAGNOSIS — L97422 Non-pressure chronic ulcer of left heel and midfoot with fat layer exposed: Secondary | ICD-10-CM | POA: Diagnosis not present

## 2021-01-27 DIAGNOSIS — L97423 Non-pressure chronic ulcer of left heel and midfoot with necrosis of muscle: Secondary | ICD-10-CM | POA: Diagnosis not present

## 2021-01-27 DIAGNOSIS — L97512 Non-pressure chronic ulcer of other part of right foot with fat layer exposed: Secondary | ICD-10-CM | POA: Diagnosis not present

## 2021-01-30 DIAGNOSIS — M86672 Other chronic osteomyelitis, left ankle and foot: Secondary | ICD-10-CM | POA: Diagnosis not present

## 2021-01-30 DIAGNOSIS — K5792 Diverticulitis of intestine, part unspecified, without perforation or abscess without bleeding: Secondary | ICD-10-CM | POA: Diagnosis not present

## 2021-01-30 DIAGNOSIS — T8781 Dehiscence of amputation stump: Secondary | ICD-10-CM | POA: Diagnosis not present

## 2021-01-30 DIAGNOSIS — T8744 Infection of amputation stump, left lower extremity: Secondary | ICD-10-CM | POA: Diagnosis not present

## 2021-01-30 DIAGNOSIS — J449 Chronic obstructive pulmonary disease, unspecified: Secondary | ICD-10-CM | POA: Diagnosis not present

## 2021-01-30 DIAGNOSIS — I129 Hypertensive chronic kidney disease with stage 1 through stage 4 chronic kidney disease, or unspecified chronic kidney disease: Secondary | ICD-10-CM | POA: Diagnosis not present

## 2021-01-30 DIAGNOSIS — G9009 Other idiopathic peripheral autonomic neuropathy: Secondary | ICD-10-CM | POA: Diagnosis not present

## 2021-01-30 DIAGNOSIS — N189 Chronic kidney disease, unspecified: Secondary | ICD-10-CM | POA: Diagnosis not present

## 2021-01-30 DIAGNOSIS — L97518 Non-pressure chronic ulcer of other part of right foot with other specified severity: Secondary | ICD-10-CM | POA: Diagnosis not present

## 2021-02-02 DIAGNOSIS — M86672 Other chronic osteomyelitis, left ankle and foot: Secondary | ICD-10-CM | POA: Diagnosis not present

## 2021-02-02 DIAGNOSIS — I129 Hypertensive chronic kidney disease with stage 1 through stage 4 chronic kidney disease, or unspecified chronic kidney disease: Secondary | ICD-10-CM | POA: Diagnosis not present

## 2021-02-02 DIAGNOSIS — T8744 Infection of amputation stump, left lower extremity: Secondary | ICD-10-CM | POA: Diagnosis not present

## 2021-02-02 DIAGNOSIS — T8781 Dehiscence of amputation stump: Secondary | ICD-10-CM | POA: Diagnosis not present

## 2021-02-02 DIAGNOSIS — K5792 Diverticulitis of intestine, part unspecified, without perforation or abscess without bleeding: Secondary | ICD-10-CM | POA: Diagnosis not present

## 2021-02-02 DIAGNOSIS — G9009 Other idiopathic peripheral autonomic neuropathy: Secondary | ICD-10-CM | POA: Diagnosis not present

## 2021-02-02 DIAGNOSIS — J449 Chronic obstructive pulmonary disease, unspecified: Secondary | ICD-10-CM | POA: Diagnosis not present

## 2021-02-02 DIAGNOSIS — L97518 Non-pressure chronic ulcer of other part of right foot with other specified severity: Secondary | ICD-10-CM | POA: Diagnosis not present

## 2021-02-02 DIAGNOSIS — N189 Chronic kidney disease, unspecified: Secondary | ICD-10-CM | POA: Diagnosis not present

## 2021-02-03 DIAGNOSIS — L97423 Non-pressure chronic ulcer of left heel and midfoot with necrosis of muscle: Secondary | ICD-10-CM | POA: Diagnosis not present

## 2021-02-03 DIAGNOSIS — E08621 Diabetes mellitus due to underlying condition with foot ulcer: Secondary | ICD-10-CM | POA: Diagnosis not present

## 2021-02-03 DIAGNOSIS — L97512 Non-pressure chronic ulcer of other part of right foot with fat layer exposed: Secondary | ICD-10-CM | POA: Diagnosis not present

## 2021-02-06 DIAGNOSIS — N189 Chronic kidney disease, unspecified: Secondary | ICD-10-CM | POA: Diagnosis not present

## 2021-02-06 DIAGNOSIS — J449 Chronic obstructive pulmonary disease, unspecified: Secondary | ICD-10-CM | POA: Diagnosis not present

## 2021-02-06 DIAGNOSIS — T8744 Infection of amputation stump, left lower extremity: Secondary | ICD-10-CM | POA: Diagnosis not present

## 2021-02-06 DIAGNOSIS — T8781 Dehiscence of amputation stump: Secondary | ICD-10-CM | POA: Diagnosis not present

## 2021-02-06 DIAGNOSIS — G9009 Other idiopathic peripheral autonomic neuropathy: Secondary | ICD-10-CM | POA: Diagnosis not present

## 2021-02-06 DIAGNOSIS — L97518 Non-pressure chronic ulcer of other part of right foot with other specified severity: Secondary | ICD-10-CM | POA: Diagnosis not present

## 2021-02-06 DIAGNOSIS — K5792 Diverticulitis of intestine, part unspecified, without perforation or abscess without bleeding: Secondary | ICD-10-CM | POA: Diagnosis not present

## 2021-02-06 DIAGNOSIS — M86672 Other chronic osteomyelitis, left ankle and foot: Secondary | ICD-10-CM | POA: Diagnosis not present

## 2021-02-06 DIAGNOSIS — I129 Hypertensive chronic kidney disease with stage 1 through stage 4 chronic kidney disease, or unspecified chronic kidney disease: Secondary | ICD-10-CM | POA: Diagnosis not present

## 2021-02-09 DIAGNOSIS — T8744 Infection of amputation stump, left lower extremity: Secondary | ICD-10-CM | POA: Diagnosis not present

## 2021-02-09 DIAGNOSIS — I129 Hypertensive chronic kidney disease with stage 1 through stage 4 chronic kidney disease, or unspecified chronic kidney disease: Secondary | ICD-10-CM | POA: Diagnosis not present

## 2021-02-09 DIAGNOSIS — K5792 Diverticulitis of intestine, part unspecified, without perforation or abscess without bleeding: Secondary | ICD-10-CM | POA: Diagnosis not present

## 2021-02-09 DIAGNOSIS — T8781 Dehiscence of amputation stump: Secondary | ICD-10-CM | POA: Diagnosis not present

## 2021-02-09 DIAGNOSIS — N189 Chronic kidney disease, unspecified: Secondary | ICD-10-CM | POA: Diagnosis not present

## 2021-02-09 DIAGNOSIS — G9009 Other idiopathic peripheral autonomic neuropathy: Secondary | ICD-10-CM | POA: Diagnosis not present

## 2021-02-09 DIAGNOSIS — L97518 Non-pressure chronic ulcer of other part of right foot with other specified severity: Secondary | ICD-10-CM | POA: Diagnosis not present

## 2021-02-09 DIAGNOSIS — J449 Chronic obstructive pulmonary disease, unspecified: Secondary | ICD-10-CM | POA: Diagnosis not present

## 2021-02-09 DIAGNOSIS — M86672 Other chronic osteomyelitis, left ankle and foot: Secondary | ICD-10-CM | POA: Diagnosis not present

## 2021-02-10 DIAGNOSIS — Z89412 Acquired absence of left great toe: Secondary | ICD-10-CM | POA: Diagnosis not present

## 2021-02-10 DIAGNOSIS — Z89422 Acquired absence of other left toe(s): Secondary | ICD-10-CM | POA: Diagnosis not present

## 2021-02-10 DIAGNOSIS — L97422 Non-pressure chronic ulcer of left heel and midfoot with fat layer exposed: Secondary | ICD-10-CM | POA: Diagnosis not present

## 2021-02-10 DIAGNOSIS — L97423 Non-pressure chronic ulcer of left heel and midfoot with necrosis of muscle: Secondary | ICD-10-CM | POA: Diagnosis not present

## 2021-02-10 DIAGNOSIS — Z89432 Acquired absence of left foot: Secondary | ICD-10-CM | POA: Diagnosis not present

## 2021-02-10 DIAGNOSIS — E11621 Type 2 diabetes mellitus with foot ulcer: Secondary | ICD-10-CM | POA: Diagnosis not present

## 2021-02-10 DIAGNOSIS — L97512 Non-pressure chronic ulcer of other part of right foot with fat layer exposed: Secondary | ICD-10-CM | POA: Diagnosis not present

## 2021-02-10 DIAGNOSIS — E08621 Diabetes mellitus due to underlying condition with foot ulcer: Secondary | ICD-10-CM | POA: Diagnosis not present

## 2021-02-13 DIAGNOSIS — M86672 Other chronic osteomyelitis, left ankle and foot: Secondary | ICD-10-CM | POA: Diagnosis not present

## 2021-02-13 DIAGNOSIS — T8781 Dehiscence of amputation stump: Secondary | ICD-10-CM | POA: Diagnosis not present

## 2021-02-13 DIAGNOSIS — K5792 Diverticulitis of intestine, part unspecified, without perforation or abscess without bleeding: Secondary | ICD-10-CM | POA: Diagnosis not present

## 2021-02-13 DIAGNOSIS — L97518 Non-pressure chronic ulcer of other part of right foot with other specified severity: Secondary | ICD-10-CM | POA: Diagnosis not present

## 2021-02-13 DIAGNOSIS — N189 Chronic kidney disease, unspecified: Secondary | ICD-10-CM | POA: Diagnosis not present

## 2021-02-13 DIAGNOSIS — I129 Hypertensive chronic kidney disease with stage 1 through stage 4 chronic kidney disease, or unspecified chronic kidney disease: Secondary | ICD-10-CM | POA: Diagnosis not present

## 2021-02-13 DIAGNOSIS — G9009 Other idiopathic peripheral autonomic neuropathy: Secondary | ICD-10-CM | POA: Diagnosis not present

## 2021-02-13 DIAGNOSIS — T8744 Infection of amputation stump, left lower extremity: Secondary | ICD-10-CM | POA: Diagnosis not present

## 2021-02-13 DIAGNOSIS — J449 Chronic obstructive pulmonary disease, unspecified: Secondary | ICD-10-CM | POA: Diagnosis not present

## 2021-02-16 DIAGNOSIS — N189 Chronic kidney disease, unspecified: Secondary | ICD-10-CM | POA: Diagnosis not present

## 2021-02-16 DIAGNOSIS — I129 Hypertensive chronic kidney disease with stage 1 through stage 4 chronic kidney disease, or unspecified chronic kidney disease: Secondary | ICD-10-CM | POA: Diagnosis not present

## 2021-02-16 DIAGNOSIS — T8781 Dehiscence of amputation stump: Secondary | ICD-10-CM | POA: Diagnosis not present

## 2021-02-16 DIAGNOSIS — M86672 Other chronic osteomyelitis, left ankle and foot: Secondary | ICD-10-CM | POA: Diagnosis not present

## 2021-02-16 DIAGNOSIS — L97518 Non-pressure chronic ulcer of other part of right foot with other specified severity: Secondary | ICD-10-CM | POA: Diagnosis not present

## 2021-02-16 DIAGNOSIS — K5792 Diverticulitis of intestine, part unspecified, without perforation or abscess without bleeding: Secondary | ICD-10-CM | POA: Diagnosis not present

## 2021-02-16 DIAGNOSIS — J449 Chronic obstructive pulmonary disease, unspecified: Secondary | ICD-10-CM | POA: Diagnosis not present

## 2021-02-16 DIAGNOSIS — T8744 Infection of amputation stump, left lower extremity: Secondary | ICD-10-CM | POA: Diagnosis not present

## 2021-02-16 DIAGNOSIS — G9009 Other idiopathic peripheral autonomic neuropathy: Secondary | ICD-10-CM | POA: Diagnosis not present

## 2021-02-17 DIAGNOSIS — E11621 Type 2 diabetes mellitus with foot ulcer: Secondary | ICD-10-CM | POA: Diagnosis not present

## 2021-02-17 DIAGNOSIS — E08621 Diabetes mellitus due to underlying condition with foot ulcer: Secondary | ICD-10-CM | POA: Diagnosis not present

## 2021-02-17 DIAGNOSIS — L97423 Non-pressure chronic ulcer of left heel and midfoot with necrosis of muscle: Secondary | ICD-10-CM | POA: Diagnosis not present

## 2021-02-17 DIAGNOSIS — L97422 Non-pressure chronic ulcer of left heel and midfoot with fat layer exposed: Secondary | ICD-10-CM | POA: Diagnosis not present

## 2021-02-17 DIAGNOSIS — L97512 Non-pressure chronic ulcer of other part of right foot with fat layer exposed: Secondary | ICD-10-CM | POA: Diagnosis not present

## 2021-02-20 DIAGNOSIS — G9009 Other idiopathic peripheral autonomic neuropathy: Secondary | ICD-10-CM | POA: Diagnosis not present

## 2021-02-20 DIAGNOSIS — T8781 Dehiscence of amputation stump: Secondary | ICD-10-CM | POA: Diagnosis not present

## 2021-02-20 DIAGNOSIS — I129 Hypertensive chronic kidney disease with stage 1 through stage 4 chronic kidney disease, or unspecified chronic kidney disease: Secondary | ICD-10-CM | POA: Diagnosis not present

## 2021-02-20 DIAGNOSIS — M86672 Other chronic osteomyelitis, left ankle and foot: Secondary | ICD-10-CM | POA: Diagnosis not present

## 2021-02-20 DIAGNOSIS — N189 Chronic kidney disease, unspecified: Secondary | ICD-10-CM | POA: Diagnosis not present

## 2021-02-20 DIAGNOSIS — T8744 Infection of amputation stump, left lower extremity: Secondary | ICD-10-CM | POA: Diagnosis not present

## 2021-02-20 DIAGNOSIS — J449 Chronic obstructive pulmonary disease, unspecified: Secondary | ICD-10-CM | POA: Diagnosis not present

## 2021-02-20 DIAGNOSIS — K5792 Diverticulitis of intestine, part unspecified, without perforation or abscess without bleeding: Secondary | ICD-10-CM | POA: Diagnosis not present

## 2021-02-20 DIAGNOSIS — L97518 Non-pressure chronic ulcer of other part of right foot with other specified severity: Secondary | ICD-10-CM | POA: Diagnosis not present

## 2021-02-24 DIAGNOSIS — E11621 Type 2 diabetes mellitus with foot ulcer: Secondary | ICD-10-CM | POA: Diagnosis not present

## 2021-02-24 DIAGNOSIS — Z89432 Acquired absence of left foot: Secondary | ICD-10-CM | POA: Diagnosis not present

## 2021-02-24 DIAGNOSIS — L97512 Non-pressure chronic ulcer of other part of right foot with fat layer exposed: Secondary | ICD-10-CM | POA: Diagnosis not present

## 2021-02-24 DIAGNOSIS — L97423 Non-pressure chronic ulcer of left heel and midfoot with necrosis of muscle: Secondary | ICD-10-CM | POA: Diagnosis not present

## 2021-02-24 DIAGNOSIS — E08621 Diabetes mellitus due to underlying condition with foot ulcer: Secondary | ICD-10-CM | POA: Diagnosis not present

## 2021-02-27 DIAGNOSIS — T8781 Dehiscence of amputation stump: Secondary | ICD-10-CM | POA: Diagnosis not present

## 2021-02-27 DIAGNOSIS — T8744 Infection of amputation stump, left lower extremity: Secondary | ICD-10-CM | POA: Diagnosis not present

## 2021-02-27 DIAGNOSIS — N189 Chronic kidney disease, unspecified: Secondary | ICD-10-CM | POA: Diagnosis not present

## 2021-02-27 DIAGNOSIS — J449 Chronic obstructive pulmonary disease, unspecified: Secondary | ICD-10-CM | POA: Diagnosis not present

## 2021-02-27 DIAGNOSIS — L97518 Non-pressure chronic ulcer of other part of right foot with other specified severity: Secondary | ICD-10-CM | POA: Diagnosis not present

## 2021-02-27 DIAGNOSIS — G9009 Other idiopathic peripheral autonomic neuropathy: Secondary | ICD-10-CM | POA: Diagnosis not present

## 2021-02-27 DIAGNOSIS — K5792 Diverticulitis of intestine, part unspecified, without perforation or abscess without bleeding: Secondary | ICD-10-CM | POA: Diagnosis not present

## 2021-02-27 DIAGNOSIS — M86672 Other chronic osteomyelitis, left ankle and foot: Secondary | ICD-10-CM | POA: Diagnosis not present

## 2021-02-27 DIAGNOSIS — I129 Hypertensive chronic kidney disease with stage 1 through stage 4 chronic kidney disease, or unspecified chronic kidney disease: Secondary | ICD-10-CM | POA: Diagnosis not present

## 2021-03-02 DIAGNOSIS — N189 Chronic kidney disease, unspecified: Secondary | ICD-10-CM | POA: Diagnosis not present

## 2021-03-02 DIAGNOSIS — G9009 Other idiopathic peripheral autonomic neuropathy: Secondary | ICD-10-CM | POA: Diagnosis not present

## 2021-03-02 DIAGNOSIS — J449 Chronic obstructive pulmonary disease, unspecified: Secondary | ICD-10-CM | POA: Diagnosis not present

## 2021-03-02 DIAGNOSIS — T8781 Dehiscence of amputation stump: Secondary | ICD-10-CM | POA: Diagnosis not present

## 2021-03-02 DIAGNOSIS — T8744 Infection of amputation stump, left lower extremity: Secondary | ICD-10-CM | POA: Diagnosis not present

## 2021-03-02 DIAGNOSIS — I129 Hypertensive chronic kidney disease with stage 1 through stage 4 chronic kidney disease, or unspecified chronic kidney disease: Secondary | ICD-10-CM | POA: Diagnosis not present

## 2021-03-02 DIAGNOSIS — L97518 Non-pressure chronic ulcer of other part of right foot with other specified severity: Secondary | ICD-10-CM | POA: Diagnosis not present

## 2021-03-02 DIAGNOSIS — K5792 Diverticulitis of intestine, part unspecified, without perforation or abscess without bleeding: Secondary | ICD-10-CM | POA: Diagnosis not present

## 2021-03-02 DIAGNOSIS — M86672 Other chronic osteomyelitis, left ankle and foot: Secondary | ICD-10-CM | POA: Diagnosis not present

## 2021-03-03 DIAGNOSIS — E11621 Type 2 diabetes mellitus with foot ulcer: Secondary | ICD-10-CM | POA: Diagnosis not present

## 2021-03-03 DIAGNOSIS — Z89432 Acquired absence of left foot: Secondary | ICD-10-CM | POA: Diagnosis not present

## 2021-03-03 DIAGNOSIS — L97423 Non-pressure chronic ulcer of left heel and midfoot with necrosis of muscle: Secondary | ICD-10-CM | POA: Diagnosis not present

## 2021-03-03 DIAGNOSIS — L97512 Non-pressure chronic ulcer of other part of right foot with fat layer exposed: Secondary | ICD-10-CM | POA: Diagnosis not present

## 2021-03-03 DIAGNOSIS — L97522 Non-pressure chronic ulcer of other part of left foot with fat layer exposed: Secondary | ICD-10-CM | POA: Diagnosis not present

## 2021-03-03 DIAGNOSIS — Z89411 Acquired absence of right great toe: Secondary | ICD-10-CM | POA: Diagnosis not present

## 2021-03-03 DIAGNOSIS — L97412 Non-pressure chronic ulcer of right heel and midfoot with fat layer exposed: Secondary | ICD-10-CM | POA: Diagnosis not present

## 2021-03-06 DIAGNOSIS — T8781 Dehiscence of amputation stump: Secondary | ICD-10-CM | POA: Diagnosis not present

## 2021-03-06 DIAGNOSIS — L97518 Non-pressure chronic ulcer of other part of right foot with other specified severity: Secondary | ICD-10-CM | POA: Diagnosis not present

## 2021-03-06 DIAGNOSIS — I129 Hypertensive chronic kidney disease with stage 1 through stage 4 chronic kidney disease, or unspecified chronic kidney disease: Secondary | ICD-10-CM | POA: Diagnosis not present

## 2021-03-06 DIAGNOSIS — N189 Chronic kidney disease, unspecified: Secondary | ICD-10-CM | POA: Diagnosis not present

## 2021-03-06 DIAGNOSIS — J449 Chronic obstructive pulmonary disease, unspecified: Secondary | ICD-10-CM | POA: Diagnosis not present

## 2021-03-06 DIAGNOSIS — M86672 Other chronic osteomyelitis, left ankle and foot: Secondary | ICD-10-CM | POA: Diagnosis not present

## 2021-03-06 DIAGNOSIS — K5792 Diverticulitis of intestine, part unspecified, without perforation or abscess without bleeding: Secondary | ICD-10-CM | POA: Diagnosis not present

## 2021-03-06 DIAGNOSIS — G9009 Other idiopathic peripheral autonomic neuropathy: Secondary | ICD-10-CM | POA: Diagnosis not present

## 2021-03-06 DIAGNOSIS — T8744 Infection of amputation stump, left lower extremity: Secondary | ICD-10-CM | POA: Diagnosis not present

## 2021-03-09 DIAGNOSIS — M86672 Other chronic osteomyelitis, left ankle and foot: Secondary | ICD-10-CM | POA: Diagnosis not present

## 2021-03-09 DIAGNOSIS — G9009 Other idiopathic peripheral autonomic neuropathy: Secondary | ICD-10-CM | POA: Diagnosis not present

## 2021-03-09 DIAGNOSIS — I129 Hypertensive chronic kidney disease with stage 1 through stage 4 chronic kidney disease, or unspecified chronic kidney disease: Secondary | ICD-10-CM | POA: Diagnosis not present

## 2021-03-09 DIAGNOSIS — T8781 Dehiscence of amputation stump: Secondary | ICD-10-CM | POA: Diagnosis not present

## 2021-03-09 DIAGNOSIS — N189 Chronic kidney disease, unspecified: Secondary | ICD-10-CM | POA: Diagnosis not present

## 2021-03-09 DIAGNOSIS — K5792 Diverticulitis of intestine, part unspecified, without perforation or abscess without bleeding: Secondary | ICD-10-CM | POA: Diagnosis not present

## 2021-03-09 DIAGNOSIS — J449 Chronic obstructive pulmonary disease, unspecified: Secondary | ICD-10-CM | POA: Diagnosis not present

## 2021-03-09 DIAGNOSIS — T8744 Infection of amputation stump, left lower extremity: Secondary | ICD-10-CM | POA: Diagnosis not present

## 2021-03-09 DIAGNOSIS — L97518 Non-pressure chronic ulcer of other part of right foot with other specified severity: Secondary | ICD-10-CM | POA: Diagnosis not present

## 2021-03-10 DIAGNOSIS — E11621 Type 2 diabetes mellitus with foot ulcer: Secondary | ICD-10-CM | POA: Diagnosis not present

## 2021-03-10 DIAGNOSIS — L97423 Non-pressure chronic ulcer of left heel and midfoot with necrosis of muscle: Secondary | ICD-10-CM | POA: Diagnosis not present

## 2021-03-10 DIAGNOSIS — L97522 Non-pressure chronic ulcer of other part of left foot with fat layer exposed: Secondary | ICD-10-CM | POA: Diagnosis not present

## 2021-03-10 DIAGNOSIS — E08621 Diabetes mellitus due to underlying condition with foot ulcer: Secondary | ICD-10-CM | POA: Diagnosis not present

## 2021-03-10 DIAGNOSIS — L97512 Non-pressure chronic ulcer of other part of right foot with fat layer exposed: Secondary | ICD-10-CM | POA: Diagnosis not present

## 2021-03-11 ENCOUNTER — Telehealth: Payer: Self-pay

## 2021-03-11 NOTE — Telephone Encounter (Signed)
[employer] called to inquire if pts 01/02/21 surgery was cancelled or being rescheduled. I advised surgery cancelled at this time

## 2021-03-17 DIAGNOSIS — E11621 Type 2 diabetes mellitus with foot ulcer: Secondary | ICD-10-CM | POA: Diagnosis not present

## 2021-03-17 DIAGNOSIS — L97512 Non-pressure chronic ulcer of other part of right foot with fat layer exposed: Secondary | ICD-10-CM | POA: Diagnosis not present

## 2021-03-17 DIAGNOSIS — Z89432 Acquired absence of left foot: Secondary | ICD-10-CM | POA: Diagnosis not present

## 2021-03-17 DIAGNOSIS — L97422 Non-pressure chronic ulcer of left heel and midfoot with fat layer exposed: Secondary | ICD-10-CM | POA: Diagnosis not present

## 2021-03-17 DIAGNOSIS — L97423 Non-pressure chronic ulcer of left heel and midfoot with necrosis of muscle: Secondary | ICD-10-CM | POA: Diagnosis not present

## 2021-03-23 DIAGNOSIS — L97518 Non-pressure chronic ulcer of other part of right foot with other specified severity: Secondary | ICD-10-CM | POA: Diagnosis not present

## 2021-03-23 DIAGNOSIS — N189 Chronic kidney disease, unspecified: Secondary | ICD-10-CM | POA: Diagnosis not present

## 2021-03-23 DIAGNOSIS — J449 Chronic obstructive pulmonary disease, unspecified: Secondary | ICD-10-CM | POA: Diagnosis not present

## 2021-03-23 DIAGNOSIS — T8744 Infection of amputation stump, left lower extremity: Secondary | ICD-10-CM | POA: Diagnosis not present

## 2021-03-23 DIAGNOSIS — G9009 Other idiopathic peripheral autonomic neuropathy: Secondary | ICD-10-CM | POA: Diagnosis not present

## 2021-03-23 DIAGNOSIS — M86672 Other chronic osteomyelitis, left ankle and foot: Secondary | ICD-10-CM | POA: Diagnosis not present

## 2021-03-23 DIAGNOSIS — I129 Hypertensive chronic kidney disease with stage 1 through stage 4 chronic kidney disease, or unspecified chronic kidney disease: Secondary | ICD-10-CM | POA: Diagnosis not present

## 2021-03-23 DIAGNOSIS — K5792 Diverticulitis of intestine, part unspecified, without perforation or abscess without bleeding: Secondary | ICD-10-CM | POA: Diagnosis not present

## 2021-03-23 DIAGNOSIS — T8781 Dehiscence of amputation stump: Secondary | ICD-10-CM | POA: Diagnosis not present

## 2021-03-24 DIAGNOSIS — E1142 Type 2 diabetes mellitus with diabetic polyneuropathy: Secondary | ICD-10-CM | POA: Diagnosis not present

## 2021-03-24 DIAGNOSIS — L97423 Non-pressure chronic ulcer of left heel and midfoot with necrosis of muscle: Secondary | ICD-10-CM | POA: Diagnosis not present

## 2021-03-24 DIAGNOSIS — E08621 Diabetes mellitus due to underlying condition with foot ulcer: Secondary | ICD-10-CM | POA: Diagnosis not present

## 2021-03-24 DIAGNOSIS — E11621 Type 2 diabetes mellitus with foot ulcer: Secondary | ICD-10-CM | POA: Diagnosis not present

## 2021-03-24 DIAGNOSIS — L97424 Non-pressure chronic ulcer of left heel and midfoot with necrosis of bone: Secondary | ICD-10-CM | POA: Diagnosis not present

## 2021-03-27 DIAGNOSIS — I129 Hypertensive chronic kidney disease with stage 1 through stage 4 chronic kidney disease, or unspecified chronic kidney disease: Secondary | ICD-10-CM | POA: Diagnosis not present

## 2021-03-27 DIAGNOSIS — N189 Chronic kidney disease, unspecified: Secondary | ICD-10-CM | POA: Diagnosis not present

## 2021-03-27 DIAGNOSIS — T8744 Infection of amputation stump, left lower extremity: Secondary | ICD-10-CM | POA: Diagnosis not present

## 2021-03-27 DIAGNOSIS — J449 Chronic obstructive pulmonary disease, unspecified: Secondary | ICD-10-CM | POA: Diagnosis not present

## 2021-03-27 DIAGNOSIS — T8781 Dehiscence of amputation stump: Secondary | ICD-10-CM | POA: Diagnosis not present

## 2021-03-27 DIAGNOSIS — M86672 Other chronic osteomyelitis, left ankle and foot: Secondary | ICD-10-CM | POA: Diagnosis not present

## 2021-03-27 DIAGNOSIS — K5792 Diverticulitis of intestine, part unspecified, without perforation or abscess without bleeding: Secondary | ICD-10-CM | POA: Diagnosis not present

## 2021-03-27 DIAGNOSIS — L97518 Non-pressure chronic ulcer of other part of right foot with other specified severity: Secondary | ICD-10-CM | POA: Diagnosis not present

## 2021-03-27 DIAGNOSIS — G9009 Other idiopathic peripheral autonomic neuropathy: Secondary | ICD-10-CM | POA: Diagnosis not present

## 2021-03-31 DIAGNOSIS — E1142 Type 2 diabetes mellitus with diabetic polyneuropathy: Secondary | ICD-10-CM | POA: Diagnosis not present

## 2021-03-31 DIAGNOSIS — M86672 Other chronic osteomyelitis, left ankle and foot: Secondary | ICD-10-CM | POA: Diagnosis not present

## 2021-03-31 DIAGNOSIS — R7301 Impaired fasting glucose: Secondary | ICD-10-CM | POA: Diagnosis not present

## 2021-03-31 DIAGNOSIS — L97423 Non-pressure chronic ulcer of left heel and midfoot with necrosis of muscle: Secondary | ICD-10-CM | POA: Diagnosis not present

## 2021-04-02 DIAGNOSIS — I129 Hypertensive chronic kidney disease with stage 1 through stage 4 chronic kidney disease, or unspecified chronic kidney disease: Secondary | ICD-10-CM | POA: Diagnosis not present

## 2021-04-02 DIAGNOSIS — N189 Chronic kidney disease, unspecified: Secondary | ICD-10-CM | POA: Diagnosis not present

## 2021-04-02 DIAGNOSIS — G9009 Other idiopathic peripheral autonomic neuropathy: Secondary | ICD-10-CM | POA: Diagnosis not present

## 2021-04-02 DIAGNOSIS — M86672 Other chronic osteomyelitis, left ankle and foot: Secondary | ICD-10-CM | POA: Diagnosis not present

## 2021-04-02 DIAGNOSIS — L97518 Non-pressure chronic ulcer of other part of right foot with other specified severity: Secondary | ICD-10-CM | POA: Diagnosis not present

## 2021-04-02 DIAGNOSIS — T8744 Infection of amputation stump, left lower extremity: Secondary | ICD-10-CM | POA: Diagnosis not present

## 2021-04-02 DIAGNOSIS — J449 Chronic obstructive pulmonary disease, unspecified: Secondary | ICD-10-CM | POA: Diagnosis not present

## 2021-04-02 DIAGNOSIS — K5792 Diverticulitis of intestine, part unspecified, without perforation or abscess without bleeding: Secondary | ICD-10-CM | POA: Diagnosis not present

## 2021-04-02 DIAGNOSIS — T8781 Dehiscence of amputation stump: Secondary | ICD-10-CM | POA: Diagnosis not present

## 2021-04-07 DIAGNOSIS — E08621 Diabetes mellitus due to underlying condition with foot ulcer: Secondary | ICD-10-CM | POA: Diagnosis not present

## 2021-04-07 DIAGNOSIS — E1169 Type 2 diabetes mellitus with other specified complication: Secondary | ICD-10-CM | POA: Diagnosis not present

## 2021-04-07 DIAGNOSIS — E11621 Type 2 diabetes mellitus with foot ulcer: Secondary | ICD-10-CM | POA: Diagnosis not present

## 2021-04-07 DIAGNOSIS — L97423 Non-pressure chronic ulcer of left heel and midfoot with necrosis of muscle: Secondary | ICD-10-CM | POA: Diagnosis not present

## 2021-04-07 DIAGNOSIS — M86672 Other chronic osteomyelitis, left ankle and foot: Secondary | ICD-10-CM | POA: Diagnosis not present

## 2021-04-07 DIAGNOSIS — E1142 Type 2 diabetes mellitus with diabetic polyneuropathy: Secondary | ICD-10-CM | POA: Diagnosis not present

## 2021-04-09 DIAGNOSIS — T8744 Infection of amputation stump, left lower extremity: Secondary | ICD-10-CM | POA: Diagnosis not present

## 2021-04-09 DIAGNOSIS — G9009 Other idiopathic peripheral autonomic neuropathy: Secondary | ICD-10-CM | POA: Diagnosis not present

## 2021-04-09 DIAGNOSIS — K5792 Diverticulitis of intestine, part unspecified, without perforation or abscess without bleeding: Secondary | ICD-10-CM | POA: Diagnosis not present

## 2021-04-09 DIAGNOSIS — M86672 Other chronic osteomyelitis, left ankle and foot: Secondary | ICD-10-CM | POA: Diagnosis not present

## 2021-04-09 DIAGNOSIS — T8781 Dehiscence of amputation stump: Secondary | ICD-10-CM | POA: Diagnosis not present

## 2021-04-09 DIAGNOSIS — J449 Chronic obstructive pulmonary disease, unspecified: Secondary | ICD-10-CM | POA: Diagnosis not present

## 2021-04-09 DIAGNOSIS — I129 Hypertensive chronic kidney disease with stage 1 through stage 4 chronic kidney disease, or unspecified chronic kidney disease: Secondary | ICD-10-CM | POA: Diagnosis not present

## 2021-04-09 DIAGNOSIS — L97518 Non-pressure chronic ulcer of other part of right foot with other specified severity: Secondary | ICD-10-CM | POA: Diagnosis not present

## 2021-04-09 DIAGNOSIS — N189 Chronic kidney disease, unspecified: Secondary | ICD-10-CM | POA: Diagnosis not present

## 2021-04-14 DIAGNOSIS — E1142 Type 2 diabetes mellitus with diabetic polyneuropathy: Secondary | ICD-10-CM | POA: Diagnosis not present

## 2021-04-14 DIAGNOSIS — L97423 Non-pressure chronic ulcer of left heel and midfoot with necrosis of muscle: Secondary | ICD-10-CM | POA: Diagnosis not present

## 2021-04-14 DIAGNOSIS — M86672 Other chronic osteomyelitis, left ankle and foot: Secondary | ICD-10-CM | POA: Diagnosis not present

## 2021-04-21 DIAGNOSIS — L97522 Non-pressure chronic ulcer of other part of left foot with fat layer exposed: Secondary | ICD-10-CM | POA: Diagnosis not present

## 2021-04-21 DIAGNOSIS — L97423 Non-pressure chronic ulcer of left heel and midfoot with necrosis of muscle: Secondary | ICD-10-CM | POA: Diagnosis not present

## 2021-04-21 DIAGNOSIS — L97512 Non-pressure chronic ulcer of other part of right foot with fat layer exposed: Secondary | ICD-10-CM | POA: Diagnosis not present

## 2021-04-21 DIAGNOSIS — E1142 Type 2 diabetes mellitus with diabetic polyneuropathy: Secondary | ICD-10-CM | POA: Diagnosis not present

## 2021-04-21 DIAGNOSIS — M86672 Other chronic osteomyelitis, left ankle and foot: Secondary | ICD-10-CM | POA: Diagnosis not present

## 2021-04-21 DIAGNOSIS — E08621 Diabetes mellitus due to underlying condition with foot ulcer: Secondary | ICD-10-CM | POA: Diagnosis not present

## 2021-04-21 DIAGNOSIS — Z89432 Acquired absence of left foot: Secondary | ICD-10-CM | POA: Diagnosis not present

## 2021-04-21 DIAGNOSIS — E11621 Type 2 diabetes mellitus with foot ulcer: Secondary | ICD-10-CM | POA: Diagnosis not present

## 2021-04-28 DIAGNOSIS — Z89432 Acquired absence of left foot: Secondary | ICD-10-CM | POA: Diagnosis not present

## 2021-04-28 DIAGNOSIS — E08621 Diabetes mellitus due to underlying condition with foot ulcer: Secondary | ICD-10-CM | POA: Diagnosis not present

## 2021-04-28 DIAGNOSIS — E11621 Type 2 diabetes mellitus with foot ulcer: Secondary | ICD-10-CM | POA: Diagnosis not present

## 2021-04-28 DIAGNOSIS — L97423 Non-pressure chronic ulcer of left heel and midfoot with necrosis of muscle: Secondary | ICD-10-CM | POA: Diagnosis not present

## 2021-04-28 DIAGNOSIS — L97522 Non-pressure chronic ulcer of other part of left foot with fat layer exposed: Secondary | ICD-10-CM | POA: Diagnosis not present

## 2021-04-28 DIAGNOSIS — L97512 Non-pressure chronic ulcer of other part of right foot with fat layer exposed: Secondary | ICD-10-CM | POA: Diagnosis not present

## 2021-05-01 DIAGNOSIS — L97423 Non-pressure chronic ulcer of left heel and midfoot with necrosis of muscle: Secondary | ICD-10-CM | POA: Diagnosis not present

## 2021-05-01 DIAGNOSIS — E11621 Type 2 diabetes mellitus with foot ulcer: Secondary | ICD-10-CM | POA: Diagnosis not present

## 2021-05-01 DIAGNOSIS — M86672 Other chronic osteomyelitis, left ankle and foot: Secondary | ICD-10-CM | POA: Diagnosis not present

## 2021-05-01 DIAGNOSIS — E1142 Type 2 diabetes mellitus with diabetic polyneuropathy: Secondary | ICD-10-CM | POA: Diagnosis not present

## 2021-05-05 DIAGNOSIS — E11621 Type 2 diabetes mellitus with foot ulcer: Secondary | ICD-10-CM | POA: Diagnosis not present

## 2021-05-05 DIAGNOSIS — L97423 Non-pressure chronic ulcer of left heel and midfoot with necrosis of muscle: Secondary | ICD-10-CM | POA: Diagnosis not present

## 2021-05-05 DIAGNOSIS — Z89412 Acquired absence of left great toe: Secondary | ICD-10-CM | POA: Diagnosis not present

## 2021-05-05 DIAGNOSIS — L97522 Non-pressure chronic ulcer of other part of left foot with fat layer exposed: Secondary | ICD-10-CM | POA: Diagnosis not present

## 2021-05-05 DIAGNOSIS — E1142 Type 2 diabetes mellitus with diabetic polyneuropathy: Secondary | ICD-10-CM | POA: Diagnosis not present

## 2021-05-05 DIAGNOSIS — L97512 Non-pressure chronic ulcer of other part of right foot with fat layer exposed: Secondary | ICD-10-CM | POA: Diagnosis not present

## 2021-05-05 DIAGNOSIS — Z89422 Acquired absence of other left toe(s): Secondary | ICD-10-CM | POA: Diagnosis not present

## 2021-05-05 DIAGNOSIS — M86672 Other chronic osteomyelitis, left ankle and foot: Secondary | ICD-10-CM | POA: Diagnosis not present

## 2021-05-05 DIAGNOSIS — Z89411 Acquired absence of right great toe: Secondary | ICD-10-CM | POA: Diagnosis not present

## 2021-05-12 DIAGNOSIS — E1142 Type 2 diabetes mellitus with diabetic polyneuropathy: Secondary | ICD-10-CM | POA: Diagnosis not present

## 2021-05-12 DIAGNOSIS — E1169 Type 2 diabetes mellitus with other specified complication: Secondary | ICD-10-CM | POA: Diagnosis not present

## 2021-05-12 DIAGNOSIS — L97423 Non-pressure chronic ulcer of left heel and midfoot with necrosis of muscle: Secondary | ICD-10-CM | POA: Diagnosis not present

## 2021-05-12 DIAGNOSIS — Z89432 Acquired absence of left foot: Secondary | ICD-10-CM | POA: Diagnosis not present

## 2021-05-12 DIAGNOSIS — E11621 Type 2 diabetes mellitus with foot ulcer: Secondary | ICD-10-CM | POA: Diagnosis not present

## 2021-05-12 DIAGNOSIS — M86672 Other chronic osteomyelitis, left ankle and foot: Secondary | ICD-10-CM | POA: Diagnosis not present

## 2021-05-12 DIAGNOSIS — L97512 Non-pressure chronic ulcer of other part of right foot with fat layer exposed: Secondary | ICD-10-CM | POA: Diagnosis not present

## 2021-05-22 DIAGNOSIS — Z7984 Long term (current) use of oral hypoglycemic drugs: Secondary | ICD-10-CM | POA: Diagnosis not present

## 2021-05-22 DIAGNOSIS — E08621 Diabetes mellitus due to underlying condition with foot ulcer: Secondary | ICD-10-CM | POA: Diagnosis not present

## 2021-05-22 DIAGNOSIS — E11621 Type 2 diabetes mellitus with foot ulcer: Secondary | ICD-10-CM | POA: Diagnosis not present

## 2021-05-22 DIAGNOSIS — L97423 Non-pressure chronic ulcer of left heel and midfoot with necrosis of muscle: Secondary | ICD-10-CM | POA: Diagnosis not present

## 2021-05-22 DIAGNOSIS — L97512 Non-pressure chronic ulcer of other part of right foot with fat layer exposed: Secondary | ICD-10-CM | POA: Diagnosis not present

## 2021-05-22 DIAGNOSIS — M86672 Other chronic osteomyelitis, left ankle and foot: Secondary | ICD-10-CM | POA: Diagnosis not present

## 2021-05-28 DIAGNOSIS — Z89411 Acquired absence of right great toe: Secondary | ICD-10-CM | POA: Diagnosis not present

## 2021-05-28 DIAGNOSIS — Z89432 Acquired absence of left foot: Secondary | ICD-10-CM | POA: Diagnosis not present

## 2021-05-28 DIAGNOSIS — M86672 Other chronic osteomyelitis, left ankle and foot: Secondary | ICD-10-CM | POA: Diagnosis not present

## 2021-05-28 DIAGNOSIS — L97423 Non-pressure chronic ulcer of left heel and midfoot with necrosis of muscle: Secondary | ICD-10-CM | POA: Diagnosis not present

## 2021-05-28 DIAGNOSIS — L97512 Non-pressure chronic ulcer of other part of right foot with fat layer exposed: Secondary | ICD-10-CM | POA: Diagnosis not present

## 2021-05-28 DIAGNOSIS — E1169 Type 2 diabetes mellitus with other specified complication: Secondary | ICD-10-CM | POA: Diagnosis not present

## 2021-05-28 DIAGNOSIS — E1142 Type 2 diabetes mellitus with diabetic polyneuropathy: Secondary | ICD-10-CM | POA: Diagnosis not present

## 2021-05-28 DIAGNOSIS — E11621 Type 2 diabetes mellitus with foot ulcer: Secondary | ICD-10-CM | POA: Diagnosis not present

## 2021-06-04 DIAGNOSIS — Z89432 Acquired absence of left foot: Secondary | ICD-10-CM | POA: Diagnosis not present

## 2021-06-04 DIAGNOSIS — L97512 Non-pressure chronic ulcer of other part of right foot with fat layer exposed: Secondary | ICD-10-CM | POA: Diagnosis not present

## 2021-06-04 DIAGNOSIS — Z89411 Acquired absence of right great toe: Secondary | ICD-10-CM | POA: Diagnosis not present

## 2021-06-04 DIAGNOSIS — L97422 Non-pressure chronic ulcer of left heel and midfoot with fat layer exposed: Secondary | ICD-10-CM | POA: Diagnosis not present

## 2021-06-04 DIAGNOSIS — E1142 Type 2 diabetes mellitus with diabetic polyneuropathy: Secondary | ICD-10-CM | POA: Diagnosis not present

## 2021-06-04 DIAGNOSIS — E11621 Type 2 diabetes mellitus with foot ulcer: Secondary | ICD-10-CM | POA: Diagnosis not present

## 2021-06-04 DIAGNOSIS — L97423 Non-pressure chronic ulcer of left heel and midfoot with necrosis of muscle: Secondary | ICD-10-CM | POA: Diagnosis not present

## 2021-06-09 DIAGNOSIS — E11621 Type 2 diabetes mellitus with foot ulcer: Secondary | ICD-10-CM | POA: Diagnosis not present

## 2021-06-09 DIAGNOSIS — L97512 Non-pressure chronic ulcer of other part of right foot with fat layer exposed: Secondary | ICD-10-CM | POA: Diagnosis not present

## 2021-06-09 DIAGNOSIS — E1142 Type 2 diabetes mellitus with diabetic polyneuropathy: Secondary | ICD-10-CM | POA: Diagnosis not present

## 2021-06-09 DIAGNOSIS — L97423 Non-pressure chronic ulcer of left heel and midfoot with necrosis of muscle: Secondary | ICD-10-CM | POA: Diagnosis not present

## 2021-06-09 DIAGNOSIS — L97422 Non-pressure chronic ulcer of left heel and midfoot with fat layer exposed: Secondary | ICD-10-CM | POA: Diagnosis not present

## 2021-06-10 DIAGNOSIS — E785 Hyperlipidemia, unspecified: Secondary | ICD-10-CM | POA: Diagnosis not present

## 2021-06-10 DIAGNOSIS — Z125 Encounter for screening for malignant neoplasm of prostate: Secondary | ICD-10-CM | POA: Diagnosis not present

## 2021-06-16 DIAGNOSIS — E785 Hyperlipidemia, unspecified: Secondary | ICD-10-CM | POA: Diagnosis not present

## 2021-06-16 DIAGNOSIS — I700 Atherosclerosis of aorta: Secondary | ICD-10-CM | POA: Diagnosis not present

## 2021-06-16 DIAGNOSIS — Z0000 Encounter for general adult medical examination without abnormal findings: Secondary | ICD-10-CM | POA: Diagnosis not present

## 2021-06-16 DIAGNOSIS — I2510 Atherosclerotic heart disease of native coronary artery without angina pectoris: Secondary | ICD-10-CM | POA: Diagnosis not present

## 2021-06-16 DIAGNOSIS — I10 Essential (primary) hypertension: Secondary | ICD-10-CM | POA: Diagnosis not present

## 2021-06-16 DIAGNOSIS — R82998 Other abnormal findings in urine: Secondary | ICD-10-CM | POA: Diagnosis not present

## 2021-06-16 DIAGNOSIS — M86672 Other chronic osteomyelitis, left ankle and foot: Secondary | ICD-10-CM | POA: Diagnosis not present

## 2021-06-16 DIAGNOSIS — E1142 Type 2 diabetes mellitus with diabetic polyneuropathy: Secondary | ICD-10-CM | POA: Diagnosis not present

## 2021-06-16 DIAGNOSIS — Z1331 Encounter for screening for depression: Secondary | ICD-10-CM | POA: Diagnosis not present

## 2021-06-16 DIAGNOSIS — Z89432 Acquired absence of left foot: Secondary | ICD-10-CM | POA: Diagnosis not present

## 2021-06-16 DIAGNOSIS — Z1339 Encounter for screening examination for other mental health and behavioral disorders: Secondary | ICD-10-CM | POA: Diagnosis not present

## 2021-06-16 DIAGNOSIS — E1169 Type 2 diabetes mellitus with other specified complication: Secondary | ICD-10-CM | POA: Diagnosis not present

## 2021-06-16 DIAGNOSIS — E11621 Type 2 diabetes mellitus with foot ulcer: Secondary | ICD-10-CM | POA: Diagnosis not present

## 2021-06-16 DIAGNOSIS — Z1212 Encounter for screening for malignant neoplasm of rectum: Secondary | ICD-10-CM | POA: Diagnosis not present

## 2021-06-16 DIAGNOSIS — L97512 Non-pressure chronic ulcer of other part of right foot with fat layer exposed: Secondary | ICD-10-CM | POA: Diagnosis not present

## 2021-06-16 DIAGNOSIS — J449 Chronic obstructive pulmonary disease, unspecified: Secondary | ICD-10-CM | POA: Diagnosis not present

## 2021-06-16 DIAGNOSIS — L97423 Non-pressure chronic ulcer of left heel and midfoot with necrosis of muscle: Secondary | ICD-10-CM | POA: Diagnosis not present

## 2021-06-16 DIAGNOSIS — M869 Osteomyelitis, unspecified: Secondary | ICD-10-CM | POA: Diagnosis not present

## 2021-06-29 DIAGNOSIS — L97422 Non-pressure chronic ulcer of left heel and midfoot with fat layer exposed: Secondary | ICD-10-CM | POA: Diagnosis not present

## 2021-06-29 DIAGNOSIS — L97423 Non-pressure chronic ulcer of left heel and midfoot with necrosis of muscle: Secondary | ICD-10-CM | POA: Diagnosis not present

## 2021-06-29 DIAGNOSIS — E11621 Type 2 diabetes mellitus with foot ulcer: Secondary | ICD-10-CM | POA: Diagnosis not present

## 2021-06-29 DIAGNOSIS — E1142 Type 2 diabetes mellitus with diabetic polyneuropathy: Secondary | ICD-10-CM | POA: Diagnosis not present

## 2021-06-29 DIAGNOSIS — L97512 Non-pressure chronic ulcer of other part of right foot with fat layer exposed: Secondary | ICD-10-CM | POA: Diagnosis not present

## 2021-06-29 DIAGNOSIS — Z89432 Acquired absence of left foot: Secondary | ICD-10-CM | POA: Diagnosis not present

## 2021-06-29 DIAGNOSIS — M86672 Other chronic osteomyelitis, left ankle and foot: Secondary | ICD-10-CM | POA: Diagnosis not present

## 2021-07-03 DIAGNOSIS — L97423 Non-pressure chronic ulcer of left heel and midfoot with necrosis of muscle: Secondary | ICD-10-CM | POA: Diagnosis not present

## 2021-07-03 DIAGNOSIS — E1142 Type 2 diabetes mellitus with diabetic polyneuropathy: Secondary | ICD-10-CM | POA: Diagnosis not present

## 2021-07-03 DIAGNOSIS — M86672 Other chronic osteomyelitis, left ankle and foot: Secondary | ICD-10-CM | POA: Diagnosis not present

## 2021-07-10 DIAGNOSIS — L97423 Non-pressure chronic ulcer of left heel and midfoot with necrosis of muscle: Secondary | ICD-10-CM | POA: Diagnosis not present

## 2021-07-10 DIAGNOSIS — E11621 Type 2 diabetes mellitus with foot ulcer: Secondary | ICD-10-CM | POA: Diagnosis not present

## 2021-07-10 DIAGNOSIS — E1142 Type 2 diabetes mellitus with diabetic polyneuropathy: Secondary | ICD-10-CM | POA: Diagnosis not present

## 2021-07-10 DIAGNOSIS — Z89411 Acquired absence of right great toe: Secondary | ICD-10-CM | POA: Diagnosis not present

## 2021-07-10 DIAGNOSIS — L97512 Non-pressure chronic ulcer of other part of right foot with fat layer exposed: Secondary | ICD-10-CM | POA: Diagnosis not present

## 2021-07-10 DIAGNOSIS — M86672 Other chronic osteomyelitis, left ankle and foot: Secondary | ICD-10-CM | POA: Diagnosis not present

## 2021-07-10 DIAGNOSIS — Z89432 Acquired absence of left foot: Secondary | ICD-10-CM | POA: Diagnosis not present

## 2021-07-23 DIAGNOSIS — E11621 Type 2 diabetes mellitus with foot ulcer: Secondary | ICD-10-CM | POA: Diagnosis not present

## 2021-07-23 DIAGNOSIS — L97512 Non-pressure chronic ulcer of other part of right foot with fat layer exposed: Secondary | ICD-10-CM | POA: Diagnosis not present

## 2021-07-23 DIAGNOSIS — L97422 Non-pressure chronic ulcer of left heel and midfoot with fat layer exposed: Secondary | ICD-10-CM | POA: Diagnosis not present

## 2021-07-23 DIAGNOSIS — Z89432 Acquired absence of left foot: Secondary | ICD-10-CM | POA: Diagnosis not present

## 2021-07-23 DIAGNOSIS — L97423 Non-pressure chronic ulcer of left heel and midfoot with necrosis of muscle: Secondary | ICD-10-CM | POA: Diagnosis not present

## 2021-07-23 DIAGNOSIS — E1142 Type 2 diabetes mellitus with diabetic polyneuropathy: Secondary | ICD-10-CM | POA: Diagnosis not present

## 2021-07-23 DIAGNOSIS — Z89411 Acquired absence of right great toe: Secondary | ICD-10-CM | POA: Diagnosis not present

## 2021-07-31 DIAGNOSIS — L97512 Non-pressure chronic ulcer of other part of right foot with fat layer exposed: Secondary | ICD-10-CM | POA: Diagnosis not present

## 2021-07-31 DIAGNOSIS — Z89411 Acquired absence of right great toe: Secondary | ICD-10-CM | POA: Diagnosis not present

## 2021-07-31 DIAGNOSIS — E11621 Type 2 diabetes mellitus with foot ulcer: Secondary | ICD-10-CM | POA: Diagnosis not present

## 2021-07-31 DIAGNOSIS — L97423 Non-pressure chronic ulcer of left heel and midfoot with necrosis of muscle: Secondary | ICD-10-CM | POA: Diagnosis not present

## 2021-07-31 DIAGNOSIS — E08621 Diabetes mellitus due to underlying condition with foot ulcer: Secondary | ICD-10-CM | POA: Diagnosis not present

## 2021-07-31 DIAGNOSIS — L97422 Non-pressure chronic ulcer of left heel and midfoot with fat layer exposed: Secondary | ICD-10-CM | POA: Diagnosis not present

## 2021-07-31 DIAGNOSIS — Z89432 Acquired absence of left foot: Secondary | ICD-10-CM | POA: Diagnosis not present

## 2021-07-31 DIAGNOSIS — E1142 Type 2 diabetes mellitus with diabetic polyneuropathy: Secondary | ICD-10-CM | POA: Diagnosis not present

## 2021-08-07 DIAGNOSIS — L97422 Non-pressure chronic ulcer of left heel and midfoot with fat layer exposed: Secondary | ICD-10-CM | POA: Diagnosis not present

## 2021-08-07 DIAGNOSIS — E1142 Type 2 diabetes mellitus with diabetic polyneuropathy: Secondary | ICD-10-CM | POA: Diagnosis not present

## 2021-08-07 DIAGNOSIS — L97512 Non-pressure chronic ulcer of other part of right foot with fat layer exposed: Secondary | ICD-10-CM | POA: Diagnosis not present

## 2021-08-07 DIAGNOSIS — E11621 Type 2 diabetes mellitus with foot ulcer: Secondary | ICD-10-CM | POA: Diagnosis not present

## 2021-08-07 DIAGNOSIS — L97423 Non-pressure chronic ulcer of left heel and midfoot with necrosis of muscle: Secondary | ICD-10-CM | POA: Diagnosis not present

## 2021-08-14 DIAGNOSIS — E1142 Type 2 diabetes mellitus with diabetic polyneuropathy: Secondary | ICD-10-CM | POA: Diagnosis not present

## 2021-08-14 DIAGNOSIS — Z89432 Acquired absence of left foot: Secondary | ICD-10-CM | POA: Diagnosis not present

## 2021-08-14 DIAGNOSIS — E08621 Diabetes mellitus due to underlying condition with foot ulcer: Secondary | ICD-10-CM | POA: Diagnosis not present

## 2021-08-14 DIAGNOSIS — E11621 Type 2 diabetes mellitus with foot ulcer: Secondary | ICD-10-CM | POA: Diagnosis not present

## 2021-08-14 DIAGNOSIS — L97512 Non-pressure chronic ulcer of other part of right foot with fat layer exposed: Secondary | ICD-10-CM | POA: Diagnosis not present

## 2021-08-14 DIAGNOSIS — L97423 Non-pressure chronic ulcer of left heel and midfoot with necrosis of muscle: Secondary | ICD-10-CM | POA: Diagnosis not present

## 2021-08-20 ENCOUNTER — Other Ambulatory Visit: Payer: Self-pay | Admitting: Internal Medicine

## 2021-08-20 DIAGNOSIS — F17200 Nicotine dependence, unspecified, uncomplicated: Secondary | ICD-10-CM

## 2021-08-21 ENCOUNTER — Other Ambulatory Visit: Payer: Self-pay | Admitting: *Deleted

## 2021-08-21 DIAGNOSIS — F17210 Nicotine dependence, cigarettes, uncomplicated: Secondary | ICD-10-CM

## 2021-08-21 DIAGNOSIS — Z87891 Personal history of nicotine dependence: Secondary | ICD-10-CM

## 2021-08-26 ENCOUNTER — Ambulatory Visit (INDEPENDENT_AMBULATORY_CARE_PROVIDER_SITE_OTHER)
Admission: RE | Admit: 2021-08-26 | Discharge: 2021-08-26 | Disposition: A | Discharge status: Home / Self Care | Payer: Medicare PPO | Source: Ambulatory Visit | Attending: Internal Medicine | Admitting: Internal Medicine

## 2021-08-26 ENCOUNTER — Other Ambulatory Visit: Payer: Self-pay

## 2021-08-26 DIAGNOSIS — F17210 Nicotine dependence, cigarettes, uncomplicated: Secondary | ICD-10-CM | POA: Diagnosis not present

## 2021-08-26 DIAGNOSIS — Z87891 Personal history of nicotine dependence: Secondary | ICD-10-CM | POA: Diagnosis not present

## 2021-08-28 DIAGNOSIS — L97522 Non-pressure chronic ulcer of other part of left foot with fat layer exposed: Secondary | ICD-10-CM | POA: Diagnosis not present

## 2021-08-28 DIAGNOSIS — L97423 Non-pressure chronic ulcer of left heel and midfoot with necrosis of muscle: Secondary | ICD-10-CM | POA: Diagnosis not present

## 2021-08-28 DIAGNOSIS — E11621 Type 2 diabetes mellitus with foot ulcer: Secondary | ICD-10-CM | POA: Diagnosis not present

## 2021-08-28 DIAGNOSIS — L97412 Non-pressure chronic ulcer of right heel and midfoot with fat layer exposed: Secondary | ICD-10-CM | POA: Diagnosis not present

## 2021-08-28 DIAGNOSIS — L97512 Non-pressure chronic ulcer of other part of right foot with fat layer exposed: Secondary | ICD-10-CM | POA: Diagnosis not present

## 2021-09-04 DIAGNOSIS — Z89432 Acquired absence of left foot: Secondary | ICD-10-CM | POA: Diagnosis not present

## 2021-09-04 DIAGNOSIS — E11621 Type 2 diabetes mellitus with foot ulcer: Secondary | ICD-10-CM | POA: Diagnosis not present

## 2021-09-04 DIAGNOSIS — L97522 Non-pressure chronic ulcer of other part of left foot with fat layer exposed: Secondary | ICD-10-CM | POA: Diagnosis not present

## 2021-09-04 DIAGNOSIS — L97512 Non-pressure chronic ulcer of other part of right foot with fat layer exposed: Secondary | ICD-10-CM | POA: Diagnosis not present

## 2021-09-04 DIAGNOSIS — Z89411 Acquired absence of right great toe: Secondary | ICD-10-CM | POA: Diagnosis not present

## 2021-09-04 DIAGNOSIS — E1142 Type 2 diabetes mellitus with diabetic polyneuropathy: Secondary | ICD-10-CM | POA: Diagnosis not present

## 2021-09-04 DIAGNOSIS — L97423 Non-pressure chronic ulcer of left heel and midfoot with necrosis of muscle: Secondary | ICD-10-CM | POA: Diagnosis not present

## 2021-09-10 DIAGNOSIS — L97512 Non-pressure chronic ulcer of other part of right foot with fat layer exposed: Secondary | ICD-10-CM | POA: Diagnosis not present

## 2021-09-10 DIAGNOSIS — L97423 Non-pressure chronic ulcer of left heel and midfoot with necrosis of muscle: Secondary | ICD-10-CM | POA: Diagnosis not present

## 2021-09-10 DIAGNOSIS — E11621 Type 2 diabetes mellitus with foot ulcer: Secondary | ICD-10-CM | POA: Diagnosis not present

## 2021-09-10 DIAGNOSIS — E1142 Type 2 diabetes mellitus with diabetic polyneuropathy: Secondary | ICD-10-CM | POA: Diagnosis not present

## 2021-09-11 ENCOUNTER — Encounter: Payer: Self-pay | Admitting: *Deleted

## 2021-09-11 DIAGNOSIS — F17210 Nicotine dependence, cigarettes, uncomplicated: Secondary | ICD-10-CM

## 2021-09-11 DIAGNOSIS — Z87891 Personal history of nicotine dependence: Secondary | ICD-10-CM

## 2021-09-11 DIAGNOSIS — L84 Corns and callosities: Secondary | ICD-10-CM | POA: Diagnosis not present

## 2021-09-11 DIAGNOSIS — Z89432 Acquired absence of left foot: Secondary | ICD-10-CM | POA: Diagnosis not present

## 2021-09-11 DIAGNOSIS — E1140 Type 2 diabetes mellitus with diabetic neuropathy, unspecified: Secondary | ICD-10-CM | POA: Diagnosis not present

## 2021-09-17 DIAGNOSIS — L97512 Non-pressure chronic ulcer of other part of right foot with fat layer exposed: Secondary | ICD-10-CM | POA: Diagnosis not present

## 2021-09-17 DIAGNOSIS — Z89432 Acquired absence of left foot: Secondary | ICD-10-CM | POA: Diagnosis not present

## 2021-09-17 DIAGNOSIS — L97423 Non-pressure chronic ulcer of left heel and midfoot with necrosis of muscle: Secondary | ICD-10-CM | POA: Diagnosis not present

## 2021-09-17 DIAGNOSIS — E11621 Type 2 diabetes mellitus with foot ulcer: Secondary | ICD-10-CM | POA: Diagnosis not present

## 2021-09-17 DIAGNOSIS — L97522 Non-pressure chronic ulcer of other part of left foot with fat layer exposed: Secondary | ICD-10-CM | POA: Diagnosis not present

## 2021-09-17 DIAGNOSIS — E1142 Type 2 diabetes mellitus with diabetic polyneuropathy: Secondary | ICD-10-CM | POA: Diagnosis not present

## 2021-09-24 DIAGNOSIS — L97512 Non-pressure chronic ulcer of other part of right foot with fat layer exposed: Secondary | ICD-10-CM | POA: Diagnosis not present

## 2021-09-24 DIAGNOSIS — E1169 Type 2 diabetes mellitus with other specified complication: Secondary | ICD-10-CM | POA: Diagnosis not present

## 2021-09-24 DIAGNOSIS — I10 Essential (primary) hypertension: Secondary | ICD-10-CM | POA: Diagnosis not present

## 2021-09-24 DIAGNOSIS — E785 Hyperlipidemia, unspecified: Secondary | ICD-10-CM | POA: Diagnosis not present

## 2021-09-24 DIAGNOSIS — J449 Chronic obstructive pulmonary disease, unspecified: Secondary | ICD-10-CM | POA: Diagnosis not present

## 2021-09-24 DIAGNOSIS — E1142 Type 2 diabetes mellitus with diabetic polyneuropathy: Secondary | ICD-10-CM | POA: Diagnosis not present

## 2021-09-24 DIAGNOSIS — E11621 Type 2 diabetes mellitus with foot ulcer: Secondary | ICD-10-CM | POA: Diagnosis not present

## 2021-09-24 DIAGNOSIS — L97522 Non-pressure chronic ulcer of other part of left foot with fat layer exposed: Secondary | ICD-10-CM | POA: Diagnosis not present

## 2021-09-24 DIAGNOSIS — L97423 Non-pressure chronic ulcer of left heel and midfoot with necrosis of muscle: Secondary | ICD-10-CM | POA: Diagnosis not present

## 2021-10-01 DIAGNOSIS — L97422 Non-pressure chronic ulcer of left heel and midfoot with fat layer exposed: Secondary | ICD-10-CM | POA: Diagnosis not present

## 2021-10-01 DIAGNOSIS — Z89432 Acquired absence of left foot: Secondary | ICD-10-CM | POA: Diagnosis not present

## 2021-10-01 DIAGNOSIS — L97512 Non-pressure chronic ulcer of other part of right foot with fat layer exposed: Secondary | ICD-10-CM | POA: Diagnosis not present

## 2021-10-01 DIAGNOSIS — E11621 Type 2 diabetes mellitus with foot ulcer: Secondary | ICD-10-CM | POA: Diagnosis not present

## 2021-10-01 DIAGNOSIS — E1142 Type 2 diabetes mellitus with diabetic polyneuropathy: Secondary | ICD-10-CM | POA: Diagnosis not present

## 2021-10-01 DIAGNOSIS — L97423 Non-pressure chronic ulcer of left heel and midfoot with necrosis of muscle: Secondary | ICD-10-CM | POA: Diagnosis not present

## 2021-10-08 DIAGNOSIS — E1142 Type 2 diabetes mellitus with diabetic polyneuropathy: Secondary | ICD-10-CM | POA: Diagnosis not present

## 2021-10-08 DIAGNOSIS — L97512 Non-pressure chronic ulcer of other part of right foot with fat layer exposed: Secondary | ICD-10-CM | POA: Diagnosis not present

## 2021-10-08 DIAGNOSIS — L97423 Non-pressure chronic ulcer of left heel and midfoot with necrosis of muscle: Secondary | ICD-10-CM | POA: Diagnosis not present

## 2021-10-08 DIAGNOSIS — L97422 Non-pressure chronic ulcer of left heel and midfoot with fat layer exposed: Secondary | ICD-10-CM | POA: Diagnosis not present

## 2021-10-08 DIAGNOSIS — Z89411 Acquired absence of right great toe: Secondary | ICD-10-CM | POA: Diagnosis not present

## 2021-10-08 DIAGNOSIS — Z89432 Acquired absence of left foot: Secondary | ICD-10-CM | POA: Diagnosis not present

## 2021-10-08 DIAGNOSIS — E11621 Type 2 diabetes mellitus with foot ulcer: Secondary | ICD-10-CM | POA: Diagnosis not present

## 2021-10-15 DIAGNOSIS — L97512 Non-pressure chronic ulcer of other part of right foot with fat layer exposed: Secondary | ICD-10-CM | POA: Diagnosis not present

## 2021-10-15 DIAGNOSIS — E11621 Type 2 diabetes mellitus with foot ulcer: Secondary | ICD-10-CM | POA: Diagnosis not present

## 2021-10-15 DIAGNOSIS — Z89432 Acquired absence of left foot: Secondary | ICD-10-CM | POA: Diagnosis not present

## 2021-10-15 DIAGNOSIS — E1142 Type 2 diabetes mellitus with diabetic polyneuropathy: Secondary | ICD-10-CM | POA: Diagnosis not present

## 2021-10-15 DIAGNOSIS — L97423 Non-pressure chronic ulcer of left heel and midfoot with necrosis of muscle: Secondary | ICD-10-CM | POA: Diagnosis not present

## 2021-10-20 DIAGNOSIS — L97516 Non-pressure chronic ulcer of other part of right foot with bone involvement without evidence of necrosis: Secondary | ICD-10-CM | POA: Diagnosis not present

## 2021-10-20 DIAGNOSIS — I10 Essential (primary) hypertension: Secondary | ICD-10-CM | POA: Diagnosis not present

## 2021-10-20 DIAGNOSIS — E08621 Diabetes mellitus due to underlying condition with foot ulcer: Secondary | ICD-10-CM | POA: Diagnosis not present

## 2021-10-20 DIAGNOSIS — J449 Chronic obstructive pulmonary disease, unspecified: Secondary | ICD-10-CM | POA: Diagnosis not present

## 2021-10-20 DIAGNOSIS — Z89411 Acquired absence of right great toe: Secondary | ICD-10-CM | POA: Diagnosis not present

## 2021-10-20 DIAGNOSIS — E785 Hyperlipidemia, unspecified: Secondary | ICD-10-CM | POA: Diagnosis not present

## 2021-10-20 DIAGNOSIS — L97423 Non-pressure chronic ulcer of left heel and midfoot with necrosis of muscle: Secondary | ICD-10-CM | POA: Diagnosis not present

## 2021-10-20 DIAGNOSIS — L97412 Non-pressure chronic ulcer of right heel and midfoot with fat layer exposed: Secondary | ICD-10-CM | POA: Diagnosis not present

## 2021-10-20 DIAGNOSIS — E11621 Type 2 diabetes mellitus with foot ulcer: Secondary | ICD-10-CM | POA: Diagnosis not present

## 2021-10-20 DIAGNOSIS — F17200 Nicotine dependence, unspecified, uncomplicated: Secondary | ICD-10-CM | POA: Diagnosis not present

## 2021-10-29 DIAGNOSIS — L97512 Non-pressure chronic ulcer of other part of right foot with fat layer exposed: Secondary | ICD-10-CM | POA: Diagnosis not present

## 2021-10-29 DIAGNOSIS — L97422 Non-pressure chronic ulcer of left heel and midfoot with fat layer exposed: Secondary | ICD-10-CM | POA: Diagnosis not present

## 2021-10-29 DIAGNOSIS — E1142 Type 2 diabetes mellitus with diabetic polyneuropathy: Secondary | ICD-10-CM | POA: Diagnosis not present

## 2021-10-29 DIAGNOSIS — E11621 Type 2 diabetes mellitus with foot ulcer: Secondary | ICD-10-CM | POA: Diagnosis not present

## 2021-10-29 DIAGNOSIS — Z89411 Acquired absence of right great toe: Secondary | ICD-10-CM | POA: Diagnosis not present

## 2021-10-29 DIAGNOSIS — L97412 Non-pressure chronic ulcer of right heel and midfoot with fat layer exposed: Secondary | ICD-10-CM | POA: Diagnosis not present

## 2021-10-29 DIAGNOSIS — F17200 Nicotine dependence, unspecified, uncomplicated: Secondary | ICD-10-CM | POA: Diagnosis not present

## 2021-10-29 DIAGNOSIS — L97423 Non-pressure chronic ulcer of left heel and midfoot with necrosis of muscle: Secondary | ICD-10-CM | POA: Diagnosis not present

## 2021-10-29 DIAGNOSIS — Z89412 Acquired absence of left great toe: Secondary | ICD-10-CM | POA: Diagnosis not present

## 2021-10-29 DIAGNOSIS — Z89422 Acquired absence of other left toe(s): Secondary | ICD-10-CM | POA: Diagnosis not present

## 2021-11-05 DIAGNOSIS — L97522 Non-pressure chronic ulcer of other part of left foot with fat layer exposed: Secondary | ICD-10-CM | POA: Diagnosis not present

## 2021-11-05 DIAGNOSIS — E11621 Type 2 diabetes mellitus with foot ulcer: Secondary | ICD-10-CM | POA: Diagnosis not present

## 2021-11-05 DIAGNOSIS — E1169 Type 2 diabetes mellitus with other specified complication: Secondary | ICD-10-CM | POA: Diagnosis not present

## 2021-11-05 DIAGNOSIS — L97512 Non-pressure chronic ulcer of other part of right foot with fat layer exposed: Secondary | ICD-10-CM | POA: Diagnosis not present

## 2021-11-05 DIAGNOSIS — E785 Hyperlipidemia, unspecified: Secondary | ICD-10-CM | POA: Diagnosis not present

## 2021-11-05 DIAGNOSIS — Z89411 Acquired absence of right great toe: Secondary | ICD-10-CM | POA: Diagnosis not present

## 2021-11-05 DIAGNOSIS — M86672 Other chronic osteomyelitis, left ankle and foot: Secondary | ICD-10-CM | POA: Diagnosis not present

## 2021-11-05 DIAGNOSIS — L97412 Non-pressure chronic ulcer of right heel and midfoot with fat layer exposed: Secondary | ICD-10-CM | POA: Diagnosis not present

## 2021-11-05 DIAGNOSIS — E1142 Type 2 diabetes mellitus with diabetic polyneuropathy: Secondary | ICD-10-CM | POA: Diagnosis not present

## 2021-11-05 DIAGNOSIS — Z89432 Acquired absence of left foot: Secondary | ICD-10-CM | POA: Diagnosis not present

## 2021-11-05 DIAGNOSIS — M25375 Other instability, left foot: Secondary | ICD-10-CM | POA: Diagnosis not present

## 2021-11-05 DIAGNOSIS — I10 Essential (primary) hypertension: Secondary | ICD-10-CM | POA: Diagnosis not present

## 2021-11-05 DIAGNOSIS — L97421 Non-pressure chronic ulcer of left heel and midfoot limited to breakdown of skin: Secondary | ICD-10-CM | POA: Diagnosis not present

## 2021-11-07 DIAGNOSIS — M25376 Other instability, unspecified foot: Secondary | ICD-10-CM | POA: Diagnosis not present

## 2021-11-12 DIAGNOSIS — L97512 Non-pressure chronic ulcer of other part of right foot with fat layer exposed: Secondary | ICD-10-CM | POA: Diagnosis not present

## 2021-11-12 DIAGNOSIS — Z89411 Acquired absence of right great toe: Secondary | ICD-10-CM | POA: Diagnosis not present

## 2021-11-12 DIAGNOSIS — L97412 Non-pressure chronic ulcer of right heel and midfoot with fat layer exposed: Secondary | ICD-10-CM | POA: Diagnosis not present

## 2021-11-12 DIAGNOSIS — M86672 Other chronic osteomyelitis, left ankle and foot: Secondary | ICD-10-CM | POA: Diagnosis not present

## 2021-11-12 DIAGNOSIS — E11621 Type 2 diabetes mellitus with foot ulcer: Secondary | ICD-10-CM | POA: Diagnosis not present

## 2021-11-12 DIAGNOSIS — E1142 Type 2 diabetes mellitus with diabetic polyneuropathy: Secondary | ICD-10-CM | POA: Diagnosis not present

## 2021-11-26 DIAGNOSIS — L97512 Non-pressure chronic ulcer of other part of right foot with fat layer exposed: Secondary | ICD-10-CM | POA: Diagnosis not present

## 2021-11-26 DIAGNOSIS — E11621 Type 2 diabetes mellitus with foot ulcer: Secondary | ICD-10-CM | POA: Diagnosis not present

## 2021-11-26 DIAGNOSIS — E1142 Type 2 diabetes mellitus with diabetic polyneuropathy: Secondary | ICD-10-CM | POA: Diagnosis not present

## 2021-11-26 DIAGNOSIS — Z89411 Acquired absence of right great toe: Secondary | ICD-10-CM | POA: Diagnosis not present

## 2021-12-04 DIAGNOSIS — L97512 Non-pressure chronic ulcer of other part of right foot with fat layer exposed: Secondary | ICD-10-CM | POA: Diagnosis not present

## 2021-12-04 DIAGNOSIS — E11621 Type 2 diabetes mellitus with foot ulcer: Secondary | ICD-10-CM | POA: Diagnosis not present

## 2021-12-04 DIAGNOSIS — E1142 Type 2 diabetes mellitus with diabetic polyneuropathy: Secondary | ICD-10-CM | POA: Diagnosis not present

## 2021-12-04 DIAGNOSIS — Z89411 Acquired absence of right great toe: Secondary | ICD-10-CM | POA: Diagnosis not present

## 2021-12-10 DIAGNOSIS — L97512 Non-pressure chronic ulcer of other part of right foot with fat layer exposed: Secondary | ICD-10-CM | POA: Diagnosis not present

## 2021-12-10 DIAGNOSIS — E1142 Type 2 diabetes mellitus with diabetic polyneuropathy: Secondary | ICD-10-CM | POA: Diagnosis not present

## 2021-12-10 DIAGNOSIS — Z89432 Acquired absence of left foot: Secondary | ICD-10-CM | POA: Diagnosis not present

## 2021-12-10 DIAGNOSIS — M25375 Other instability, left foot: Secondary | ICD-10-CM | POA: Diagnosis not present

## 2021-12-10 DIAGNOSIS — Z89411 Acquired absence of right great toe: Secondary | ICD-10-CM | POA: Diagnosis not present

## 2021-12-10 DIAGNOSIS — L97423 Non-pressure chronic ulcer of left heel and midfoot with necrosis of muscle: Secondary | ICD-10-CM | POA: Diagnosis not present

## 2021-12-10 DIAGNOSIS — E11621 Type 2 diabetes mellitus with foot ulcer: Secondary | ICD-10-CM | POA: Diagnosis not present

## 2021-12-10 DIAGNOSIS — L97422 Non-pressure chronic ulcer of left heel and midfoot with fat layer exposed: Secondary | ICD-10-CM | POA: Diagnosis not present

## 2021-12-17 DIAGNOSIS — L97422 Non-pressure chronic ulcer of left heel and midfoot with fat layer exposed: Secondary | ICD-10-CM | POA: Diagnosis not present

## 2021-12-17 DIAGNOSIS — Z89432 Acquired absence of left foot: Secondary | ICD-10-CM | POA: Diagnosis not present

## 2021-12-17 DIAGNOSIS — L97423 Non-pressure chronic ulcer of left heel and midfoot with necrosis of muscle: Secondary | ICD-10-CM | POA: Diagnosis not present

## 2021-12-17 DIAGNOSIS — E11621 Type 2 diabetes mellitus with foot ulcer: Secondary | ICD-10-CM | POA: Diagnosis not present

## 2021-12-17 DIAGNOSIS — E1142 Type 2 diabetes mellitus with diabetic polyneuropathy: Secondary | ICD-10-CM | POA: Diagnosis not present

## 2021-12-17 DIAGNOSIS — L97512 Non-pressure chronic ulcer of other part of right foot with fat layer exposed: Secondary | ICD-10-CM | POA: Diagnosis not present

## 2021-12-17 DIAGNOSIS — Z89411 Acquired absence of right great toe: Secondary | ICD-10-CM | POA: Diagnosis not present

## 2021-12-17 DIAGNOSIS — Z89422 Acquired absence of other left toe(s): Secondary | ICD-10-CM | POA: Diagnosis not present

## 2021-12-18 DIAGNOSIS — I10 Essential (primary) hypertension: Secondary | ICD-10-CM | POA: Diagnosis not present

## 2021-12-18 DIAGNOSIS — I700 Atherosclerosis of aorta: Secondary | ICD-10-CM | POA: Diagnosis not present

## 2021-12-18 DIAGNOSIS — M869 Osteomyelitis, unspecified: Secondary | ICD-10-CM | POA: Diagnosis not present

## 2021-12-18 DIAGNOSIS — E1169 Type 2 diabetes mellitus with other specified complication: Secondary | ICD-10-CM | POA: Diagnosis not present

## 2021-12-18 DIAGNOSIS — E1140 Type 2 diabetes mellitus with diabetic neuropathy, unspecified: Secondary | ICD-10-CM | POA: Diagnosis not present

## 2021-12-18 DIAGNOSIS — I739 Peripheral vascular disease, unspecified: Secondary | ICD-10-CM | POA: Diagnosis not present

## 2021-12-18 DIAGNOSIS — J449 Chronic obstructive pulmonary disease, unspecified: Secondary | ICD-10-CM | POA: Diagnosis not present

## 2021-12-24 DIAGNOSIS — Z89432 Acquired absence of left foot: Secondary | ICD-10-CM | POA: Diagnosis not present

## 2021-12-24 DIAGNOSIS — L97423 Non-pressure chronic ulcer of left heel and midfoot with necrosis of muscle: Secondary | ICD-10-CM | POA: Diagnosis not present

## 2021-12-24 DIAGNOSIS — L97422 Non-pressure chronic ulcer of left heel and midfoot with fat layer exposed: Secondary | ICD-10-CM | POA: Diagnosis not present

## 2021-12-24 DIAGNOSIS — E1142 Type 2 diabetes mellitus with diabetic polyneuropathy: Secondary | ICD-10-CM | POA: Diagnosis not present

## 2021-12-24 DIAGNOSIS — L97512 Non-pressure chronic ulcer of other part of right foot with fat layer exposed: Secondary | ICD-10-CM | POA: Diagnosis not present

## 2021-12-24 DIAGNOSIS — Z89411 Acquired absence of right great toe: Secondary | ICD-10-CM | POA: Diagnosis not present

## 2021-12-24 DIAGNOSIS — Z7984 Long term (current) use of oral hypoglycemic drugs: Secondary | ICD-10-CM | POA: Diagnosis not present

## 2021-12-24 DIAGNOSIS — E11621 Type 2 diabetes mellitus with foot ulcer: Secondary | ICD-10-CM | POA: Diagnosis not present

## 2021-12-31 DIAGNOSIS — L97522 Non-pressure chronic ulcer of other part of left foot with fat layer exposed: Secondary | ICD-10-CM | POA: Diagnosis not present

## 2021-12-31 DIAGNOSIS — Z89432 Acquired absence of left foot: Secondary | ICD-10-CM | POA: Diagnosis not present

## 2021-12-31 DIAGNOSIS — Z89411 Acquired absence of right great toe: Secondary | ICD-10-CM | POA: Diagnosis not present

## 2021-12-31 DIAGNOSIS — L89892 Pressure ulcer of other site, stage 2: Secondary | ICD-10-CM | POA: Diagnosis not present

## 2021-12-31 DIAGNOSIS — L97512 Non-pressure chronic ulcer of other part of right foot with fat layer exposed: Secondary | ICD-10-CM | POA: Diagnosis not present

## 2021-12-31 DIAGNOSIS — E1142 Type 2 diabetes mellitus with diabetic polyneuropathy: Secondary | ICD-10-CM | POA: Diagnosis not present

## 2021-12-31 DIAGNOSIS — E11621 Type 2 diabetes mellitus with foot ulcer: Secondary | ICD-10-CM | POA: Diagnosis not present

## 2022-01-07 DIAGNOSIS — L97512 Non-pressure chronic ulcer of other part of right foot with fat layer exposed: Secondary | ICD-10-CM | POA: Diagnosis not present

## 2022-01-07 DIAGNOSIS — E11621 Type 2 diabetes mellitus with foot ulcer: Secondary | ICD-10-CM | POA: Diagnosis not present

## 2022-01-07 DIAGNOSIS — L97521 Non-pressure chronic ulcer of other part of left foot limited to breakdown of skin: Secondary | ICD-10-CM | POA: Diagnosis not present

## 2022-01-07 DIAGNOSIS — L97423 Non-pressure chronic ulcer of left heel and midfoot with necrosis of muscle: Secondary | ICD-10-CM | POA: Diagnosis not present

## 2022-01-07 DIAGNOSIS — Z89411 Acquired absence of right great toe: Secondary | ICD-10-CM | POA: Diagnosis not present

## 2022-01-07 DIAGNOSIS — Z89432 Acquired absence of left foot: Secondary | ICD-10-CM | POA: Diagnosis not present

## 2022-01-07 DIAGNOSIS — E1142 Type 2 diabetes mellitus with diabetic polyneuropathy: Secondary | ICD-10-CM | POA: Diagnosis not present

## 2022-01-07 DIAGNOSIS — L97511 Non-pressure chronic ulcer of other part of right foot limited to breakdown of skin: Secondary | ICD-10-CM | POA: Diagnosis not present

## 2022-01-14 DIAGNOSIS — L97512 Non-pressure chronic ulcer of other part of right foot with fat layer exposed: Secondary | ICD-10-CM | POA: Diagnosis not present

## 2022-01-14 DIAGNOSIS — E08621 Diabetes mellitus due to underlying condition with foot ulcer: Secondary | ICD-10-CM | POA: Diagnosis not present

## 2022-01-14 DIAGNOSIS — E11621 Type 2 diabetes mellitus with foot ulcer: Secondary | ICD-10-CM | POA: Diagnosis not present

## 2022-01-14 DIAGNOSIS — L97421 Non-pressure chronic ulcer of left heel and midfoot limited to breakdown of skin: Secondary | ICD-10-CM | POA: Diagnosis not present

## 2022-01-14 DIAGNOSIS — L97423 Non-pressure chronic ulcer of left heel and midfoot with necrosis of muscle: Secondary | ICD-10-CM | POA: Diagnosis not present

## 2022-01-14 DIAGNOSIS — E1142 Type 2 diabetes mellitus with diabetic polyneuropathy: Secondary | ICD-10-CM | POA: Diagnosis not present

## 2022-01-14 DIAGNOSIS — Z89411 Acquired absence of right great toe: Secondary | ICD-10-CM | POA: Diagnosis not present

## 2022-01-14 DIAGNOSIS — Z89432 Acquired absence of left foot: Secondary | ICD-10-CM | POA: Diagnosis not present

## 2022-01-21 DIAGNOSIS — L97423 Non-pressure chronic ulcer of left heel and midfoot with necrosis of muscle: Secondary | ICD-10-CM | POA: Diagnosis not present

## 2022-01-21 DIAGNOSIS — E11621 Type 2 diabetes mellitus with foot ulcer: Secondary | ICD-10-CM | POA: Diagnosis not present

## 2022-01-21 DIAGNOSIS — L97421 Non-pressure chronic ulcer of left heel and midfoot limited to breakdown of skin: Secondary | ICD-10-CM | POA: Diagnosis not present

## 2022-01-21 DIAGNOSIS — Z89432 Acquired absence of left foot: Secondary | ICD-10-CM | POA: Diagnosis not present

## 2022-01-21 DIAGNOSIS — Z89411 Acquired absence of right great toe: Secondary | ICD-10-CM | POA: Diagnosis not present

## 2022-01-21 DIAGNOSIS — E08621 Diabetes mellitus due to underlying condition with foot ulcer: Secondary | ICD-10-CM | POA: Diagnosis not present

## 2022-01-21 DIAGNOSIS — L97512 Non-pressure chronic ulcer of other part of right foot with fat layer exposed: Secondary | ICD-10-CM | POA: Diagnosis not present

## 2022-08-26 ENCOUNTER — Ambulatory Visit (HOSPITAL_COMMUNITY): Payer: Medicare PPO

## 2022-09-10 ENCOUNTER — Ambulatory Visit (HOSPITAL_COMMUNITY): Payer: Medicare PPO

## 2022-09-10 ENCOUNTER — Encounter (HOSPITAL_COMMUNITY): Payer: Self-pay

## 2022-10-01 ENCOUNTER — Encounter: Payer: Self-pay | Admitting: Internal Medicine

## 2022-11-03 ENCOUNTER — Encounter: Payer: Self-pay | Admitting: *Deleted

## 2023-01-31 ENCOUNTER — Other Ambulatory Visit: Payer: Self-pay | Admitting: Internal Medicine

## 2023-01-31 DIAGNOSIS — Z720 Tobacco use: Secondary | ICD-10-CM

## 2023-02-28 ENCOUNTER — Ambulatory Visit
Admission: RE | Admit: 2023-02-28 | Discharge: 2023-02-28 | Disposition: A | Discharge status: Home / Self Care | Payer: Medicare PPO | Source: Ambulatory Visit | Attending: Internal Medicine | Admitting: Internal Medicine

## 2023-02-28 DIAGNOSIS — Z720 Tobacco use: Secondary | ICD-10-CM

## 2023-12-09 ENCOUNTER — Other Ambulatory Visit: Payer: Self-pay | Admitting: *Deleted

## 2023-12-09 DIAGNOSIS — Z122 Encounter for screening for malignant neoplasm of respiratory organs: Secondary | ICD-10-CM

## 2023-12-09 DIAGNOSIS — F17210 Nicotine dependence, cigarettes, uncomplicated: Secondary | ICD-10-CM

## 2023-12-09 DIAGNOSIS — Z87891 Personal history of nicotine dependence: Secondary | ICD-10-CM

## 2024-02-08 ENCOUNTER — Encounter: Payer: Self-pay | Admitting: Physician Assistant

## 2024-02-08 ENCOUNTER — Telehealth: Payer: Self-pay | Admitting: Internal Medicine

## 2024-02-08 NOTE — Telephone Encounter (Signed)
 Shanda Bumps this pt needs an office visit. He was sent a letter a few weeks ago to call and set up. Can you please make an appt for him with Dr Marvell Fuller next available?

## 2024-02-08 NOTE — Telephone Encounter (Signed)
 Patient called to schedule hospital recall.

## 2024-03-22 ENCOUNTER — Encounter: Payer: Self-pay | Admitting: Acute Care

## 2024-03-29 ENCOUNTER — Ambulatory Visit
Admission: RE | Admit: 2024-03-29 | Discharge: 2024-03-29 | Disposition: A | Discharge status: Home / Self Care | Source: Ambulatory Visit | Attending: Acute Care | Admitting: Acute Care

## 2024-03-29 DIAGNOSIS — Z122 Encounter for screening for malignant neoplasm of respiratory organs: Secondary | ICD-10-CM

## 2024-03-29 DIAGNOSIS — Z87891 Personal history of nicotine dependence: Secondary | ICD-10-CM

## 2024-03-29 DIAGNOSIS — F17210 Nicotine dependence, cigarettes, uncomplicated: Secondary | ICD-10-CM

## 2024-04-02 ENCOUNTER — Ambulatory Visit: Admitting: Physician Assistant

## 2024-04-26 ENCOUNTER — Telehealth: Payer: Self-pay | Admitting: Acute Care

## 2024-04-26 ENCOUNTER — Other Ambulatory Visit: Payer: Self-pay

## 2024-04-26 DIAGNOSIS — Z122 Encounter for screening for malignant neoplasm of respiratory organs: Secondary | ICD-10-CM

## 2024-04-26 DIAGNOSIS — R911 Solitary pulmonary nodule: Secondary | ICD-10-CM

## 2024-04-26 DIAGNOSIS — Z87891 Personal history of nicotine dependence: Secondary | ICD-10-CM

## 2024-04-26 NOTE — Telephone Encounter (Signed)
 Patient has been scheduled for 05/10/2024 at 11:40 am at [facility] per patient request. Results and plan sent to PCP.

## 2024-04-26 NOTE — Telephone Encounter (Signed)
 I have called the patient with the results of his low dose CT Chest. The scan was read as a 4B. There is a new 8.5 mm right upper lobe lung nodule. Pt. States he has not been sick that he is aware of. Per the radiology guidelines, we will do a 1 month folow up scan ( Please schedule in the next 1-2 weeks since it has been a month since the scan was done >>  Scanned 03/29/2024/ Read date 04/25/2024). Thanks so much. Please fax results to PCP and let them know plan for follow up. Thanks so much

## 2024-05-10 ENCOUNTER — Ambulatory Visit
Admission: RE | Admit: 2024-05-10 | Discharge: 2024-05-10 | Disposition: A | Discharge status: Home / Self Care | Source: Ambulatory Visit | Attending: Acute Care | Admitting: Acute Care

## 2024-05-10 DIAGNOSIS — R911 Solitary pulmonary nodule: Secondary | ICD-10-CM

## 2024-05-10 DIAGNOSIS — Z87891 Personal history of nicotine dependence: Secondary | ICD-10-CM

## 2024-05-10 DIAGNOSIS — Z122 Encounter for screening for malignant neoplasm of respiratory organs: Secondary | ICD-10-CM

## 2024-05-28 ENCOUNTER — Telehealth: Payer: Self-pay | Admitting: Acute Care

## 2024-05-28 DIAGNOSIS — R911 Solitary pulmonary nodule: Secondary | ICD-10-CM

## 2024-05-28 NOTE — Telephone Encounter (Signed)
 Called and spoke to pt. Informed him of the results of his LDCT. We discussed the previous 4B nodule has reduced in size but is still warranting a short term follow up to assure stability. Patient is also aware of emphysema, he is taking Symbicort BID and has albuterol  prn. Patient already aware of CAD and is taking a statin and has routine follow ups with his PCP regarding cholesterol labs and med dosage. We also discussed the notation of PAH. Patient aware we will forward these results to PCP and they can decide to more follow up for [facility] eval. Patient verbalized understanding of results and agreeable with plan. Order has been placed for 6 month follow up scan.   IMPRESSION: 1. 6.2 mm anterior segment right upper lobe nodule, decreased in size from 8.5 mm. Lung-RADS 3, probably benign findings. Short-term follow-up in 6 months is recommended with repeat low-dose chest CT without contrast (please use the following order, CT CHEST LCS NODULE FOLLOW-UP W/O CM). 2. Aortic atherosclerosis (ICD10-I70.0). Coronary artery calcification. 3. Enlarged pulmonic trunk, indicative of pulmonary arterial hypertension. 4.  Emphysema (ICD10-J43.9).     Electronically Signed   By: Newell Eke M.D.   On: 05/27/2024 15:20

## 2024-06-07 ENCOUNTER — Ambulatory Visit: Admitting: Physician Assistant

## 2024-06-07 ENCOUNTER — Encounter: Payer: Self-pay | Admitting: Physician Assistant

## 2024-06-07 ENCOUNTER — Other Ambulatory Visit

## 2024-06-07 VITALS — BP 120/68 | HR 92 | Ht >= 80 in | Wt 360.0 lb

## 2024-06-07 DIAGNOSIS — K5900 Constipation, unspecified: Secondary | ICD-10-CM | POA: Diagnosis not present

## 2024-06-07 DIAGNOSIS — Z09 Encounter for follow-up examination after completed treatment for conditions other than malignant neoplasm: Secondary | ICD-10-CM

## 2024-06-07 DIAGNOSIS — Z860101 Personal history of adenomatous and serrated colon polyps: Secondary | ICD-10-CM | POA: Diagnosis not present

## 2024-06-07 LAB — COMPREHENSIVE METABOLIC PANEL WITH GFR
ALT: 10 U/L (ref 0–53)
AST: 17 U/L (ref 0–37)
Albumin: 3.8 g/dL (ref 3.5–5.2)
Alkaline Phosphatase: 181 U/L — ABNORMAL HIGH (ref 39–117)
BUN: 7 mg/dL (ref 6–23)
CO2: 29 meq/L (ref 19–32)
Calcium: 9.2 mg/dL (ref 8.4–10.5)
Chloride: 102 meq/L (ref 96–112)
Creatinine, Ser: 1.15 mg/dL (ref 0.40–1.50)
GFR: 66.11 mL/min (ref >60.00)
Glucose, Bld: 95 mg/dL (ref 70–99)
Potassium: 4.6 meq/L (ref 3.5–5.1)
Sodium: 141 meq/L (ref 135–145)
Total Bilirubin: 0.4 mg/dL (ref 0.2–1.2)
Total Protein: 8.4 g/dL — ABNORMAL HIGH (ref 6.0–8.3)

## 2024-06-07 LAB — CBC WITH DIFFERENTIAL/PLATELET
Basophils Absolute: 0.1 K/uL (ref 0.0–0.1)
Basophils Relative: 0.5 % (ref 0.0–3.0)
Eosinophils Absolute: 0.2 K/uL (ref 0.0–0.7)
Eosinophils Relative: 2.1 % (ref 0.0–5.0)
HCT: 48.2 % (ref 39.0–52.0)
Hemoglobin: 15.7 g/dL (ref 13.0–17.0)
Lymphocytes Relative: 27.4 % (ref 12.0–46.0)
Lymphs Abs: 2.7 K/uL (ref 0.7–4.0)
MCHC: 32.6 g/dL (ref 30.0–36.0)
MCV: 98.9 fl (ref 78.0–100.0)
Monocytes Absolute: 0.8 K/uL (ref 0.1–1.0)
Monocytes Relative: 8.5 % (ref 3.0–12.0)
Neutro Abs: 6 K/uL (ref 1.4–7.7)
Neutrophils Relative %: 61.5 % (ref 43.0–77.0)
Platelets: 323 K/uL (ref 150.0–400.0)
RBC: 4.87 Mil/uL (ref 4.22–5.81)
RDW: 16 % — ABNORMAL HIGH (ref 11.5–15.5)
WBC: 9.8 K/uL (ref 4.0–10.5)

## 2024-06-07 MED ORDER — NA SULFATE-K SULFATE-MG SULF 17.5-3.13-1.6 GM/177ML PO SOLN
1.0000 | Freq: Once | ORAL | 0 refills | Status: AC
Start: 2024-06-07 — End: 2024-06-07

## 2024-06-07 NOTE — Progress Notes (Addendum)
 Chief Complaint: Discuss colonoscopy  HPI:    [name] is a 68 year old Caucasian male with a past medical history as listed below including CKD, COPD, emphysema, hypertension and multiple others, known to Dr. Avram, who was referred to me by Larnell Hamilton, MD for question of a colonoscopy.    09/21/2017 colonoscopy done at [facility] for a colovesicular fistula with 1 diminutive polyp in the cecum, moderate diverticulosis in the sigmoid colon and internal hemorrhoids.  Pathology showed adenomatous polyp and patient told to repeat colonoscopy in 5 years.    Today, patient presents to clinic and tells me that he is due for a colonoscopy.  Does discuss some mild constipation for which he uses, it sounds like a half a capful of MiraLAX daily.  This tends to keep things moving usually.  Denies any abdominal pain or GI complaints.    Does tell me that his PCP tends to draw labs but he cannot remember when the last time was.  Tells me he is able to ambulate with just a cane.    Denies fever, chills or weight loss.  Past Medical History:  Diagnosis Date   Arthritis    Cancer (HCC)    kidney   Chronic kidney disease    mass r kidney - partial nephrectomy   COPD (chronic obstructive pulmonary disease) (HCC)    Diverticulitis    History of kidney stones    Hx of adenomatous colonic polyps 06/09/2015   Hyperlipidemia    Hypertension    has previously been on medication but then taken off - 05/29/20 pt states he's never been treated for HTN   Neuromuscular disorder (HCC)    neuropathy lower legs and feet   Neuropathy    in both feet   Pneumonia 2011    Past Surgical History:  Procedure Laterality Date   AMPUTATION Bilateral 06/05/2013   Procedure: RIGHT GREAT TOE AMPUTATION/LEFT GREAT TOE DEBRIDEMENT;  Surgeon: Norleen Armor, MD;  Location: [facility] OR;  Service: Orthopedics;  Laterality: Bilateral;   AMPUTATION Left 05/30/2020   Procedure: LEFT TRANSMETATARSAL AMPUTATION;   Surgeon: Harden Jerona GAILS, MD;  Location: [facility] OR;  Service: Orthopedics;  Laterality: Left;   APPLICATION OF WOUND VAC Left 05/30/2020   Procedure: APPLICATION OF WOUND VAC;  Surgeon: Harden Jerona GAILS, MD;  Location: [facility] OR;  Service: Orthopedics;  Laterality: Left;   COLON SURGERY     robotic partial colectomy Dr. Debby 12-30-17   COLONOSCOPY     x 2 last date 09/21/2017 bladder attached to colon eval   COLONOSCOPY WITH PROPOFOL  N/A 09/21/2017   Procedure: COLONOSCOPY WITH PROPOFOL ;  Surgeon: Avram Lupita BRAVO, MD;  Location: [facility];  Service: Endoscopy;  Laterality: N/A;   MOUTH SURGERY     PARTIAL NEPHRECTOMY     right Dr. Bernarda Debby 12-30-17   ROBOTIC ASSITED PARTIAL NEPHRECTOMY Right 12/30/2017   Procedure: XI ROBOTIC ASSITED RIGHT PARTIAL NEPHRECTOMY;  Surgeon: Alvaro Hummer, MD;  Location: [facility] ORS;  Service: Urology;  Laterality: Right;   STUMP REVISION Left 10/08/2020   Procedure: REVISION LEFT TRANSMETATARSAL AMPUTATION;  Surgeon: Harden Jerona GAILS, MD;  Location: [facility] OR;  Service: Orthopedics;  Laterality: Left;   TOE AMPUTATION Right 06/05/2013   RIGHT GREAT TOE PARTIAL AMPUTATION     TOOTH EXTRACTION  2011   multiple teeth extracted    Current Outpatient Medications  Medication Sig Dispense Refill   atorvastatin  (LIPITOR) 10 MG tablet Take 10 mg by mouth daily.  5   diclofenac Sodium (VOLTAREN) 1 % GEL Apply 1 application topically 4 (four) times daily as needed (pain.).     docusate sodium  (COLACE) 50 MG capsule Take 50 mg by mouth daily as needed for mild constipation.     doxycycline  (VIBRA -TABS) 100 MG tablet Take 1 tablet (100 mg total) by mouth 2 (two) times daily. 60 tablet 0   DULoxetine  (CYMBALTA ) 60 MG capsule Take 60 mg by mouth daily.      oxyCODONE  (OXY IR/ROXICODONE ) 5 MG immediate release tablet Take 1 tablet (5 mg total) by mouth every 6 (six) hours as needed for moderate pain (pain score 4-6). 20 tablet 0   pregabalin  (LYRICA ) 150 MG capsule Take 1 capsule (150 mg  total) by mouth 3 (three) times daily. (Patient taking differently: Take 300 mg by mouth in the morning and at bedtime. ) 90 capsule 5   PROAIR  HFA 108 (90 Base) MCG/ACT inhaler Inhale 2 puffs into the lungs every 6 (six) hours as needed (for wheezing/shortness of breath.).      SYMBICORT 160-4.5 MCG/ACT inhaler Inhale 2 puffs into the lungs 2 (two) times daily.      tamsulosin  (FLOMAX ) 0.4 MG CAPS capsule Take 0.4 mg by mouth 2 (two) times daily.      varenicline  (CHANTIX ) 1 MG tablet Take 1 mg by mouth 2 (two) times daily.     No current facility-administered medications for this visit.    Allergies as of 06/07/2024   (No Known Allergies)    Family History  Problem Relation Age of Onset   Heart disease Mother    Esophageal cancer Mother    Diabetes Father    Pancreatic cancer Father    Breast cancer Sister    Liver disease Neg Hx     Social History   Socioeconomic History   Marital status: Single    Spouse name: Not on file   Number of children: 1   Years of education: Not on file   Highest education level: Not on file  Occupational History   Occupation: retired  Tobacco Use   Smoking status: Some Days    Current packs/day: 0.20    Average packs/day: 0.2 packs/day for 50.5 years (10.1 ttl pk-yrs)    Types: Cigarettes    Start date: 11/29/1973   Smokeless tobacco: Never   Tobacco comments:    occasional smoker - ~1 pack/week. smoked ~1.5ppd until 5 yr ago  Vaping Use   Vaping status: Never Used  Substance and Sexual Activity   Alcohol  use: No    Alcohol /week: 0.0 standard drinks of alcohol    Drug use: No   Sexual activity: Not Currently    Partners: Female  Other Topics Concern   Not on file  Social History Narrative   Not on file   Social Drivers of Health   Financial Resource Strain: Not on file  Food Insecurity: Not on file  Transportation Needs: Not on file  Physical Activity: Not on file  Stress: Not on file  Social Connections: Not on file  Intimate  Partner Violence: Not on file    Review of Systems:    Constitutional: No weight loss, fever or chills Skin: No rash  Cardiovascular: No chest pain   Respiratory: No SOB Gastrointestinal: See HPI and otherwise negative Genitourinary: No dysuria Neurological: No headache, dizziness or syncope Musculoskeletal: No new muscle or joint pain Hematologic: No bleeding  Psychiatric: No history of depression or anxiety   Physical Exam:  Vital signs:  BP 120/68   Pulse 92   Ht 6' 8 (2.032 m)   Wt (!) 360 lb (163.3 kg)   BMI 39.55 kg/m    Constitutional:   Pleasant overweight Caucasian male appears to be in NAD, Well developed, Well nourished, alert and cooperative Head:  Normocephalic and atraumatic. Eyes:   PEERL, EOMI. No icterus. Conjunctiva pink. Ears:  Normal auditory acuity. Neck:  Supple Throat: Oral cavity and pharynx without inflammation, swelling or lesion.  Respiratory: Respirations even and unlabored. Lungs clear to auscultation bilaterally.   No wheezes, crackles, or rhonchi.  Cardiovascular: Normal S1, S2. No MRG. Regular rate and rhythm. No peripheral edema, cyanosis or pallor.  Gastrointestinal:  Soft, nondistended, nontender. No rebound or guarding. Normal bowel sounds. No appreciable masses or hepatomegaly. Rectal:  Not performed.  Msk:  Symmetrical without gross deformities. Without edema, no deformity or joint abnormality. +ambulates with cane Neurologic:  Alert and  oriented x4;  grossly normal neurologically.  Skin:   Dry and intact without significant lesions or rashes. Psychiatric: Demonstrates good judgement and reason without abnormal affect or behaviors.  RELEVANT LABS AND IMAGING: CBC    Component Value Date/Time   WBC 8.7 10/08/2020 0831   RBC 4.84 10/08/2020 0831   HGB 14.4 10/08/2020 0831   HGB 13.7 06/14/2017 0717   HCT 46.9 10/08/2020 0831   HCT 41.1 06/14/2017 0717   PLT 323 10/08/2020 0831   PLT 297 06/14/2017 0717   MCV 96.9 10/08/2020 0831    MCV 92 06/14/2017 0717   MCH 29.8 10/08/2020 0831   MCHC 30.7 10/08/2020 0831   RDW 14.3 10/08/2020 0831   RDW 13.7 06/14/2017 0717   LYMPHSABS 3.8 (H) 06/14/2017 0717   MONOABS 0.6 01/31/2015 1156   EOSABS 0.3 06/14/2017 0717   BASOSABS 0.0 06/14/2017 0717    CMP     Component Value Date/Time   NA 139 10/08/2020 0831   NA 141 05/17/2017 0731   K 4.1 10/08/2020 0831   CL 105 10/08/2020 0831   CO2 24 10/08/2020 0831   GLUCOSE 103 (H) 10/08/2020 0831   BUN 12 10/08/2020 0831   BUN 13 05/17/2017 0731   CREATININE 1.16 10/08/2020 0831   CREATININE 1.01 03/12/2014 1458   CALCIUM  8.8 (L) 10/08/2020 0831   PROT 7.2 08/23/2017 0813   ALBUMIN 4.0 03/12/2014 1458   AST 38 (H) 03/12/2014 1458   ALT 21 03/12/2014 1458   ALKPHOS 101 03/12/2014 1458   BILITOT 0.4 03/12/2014 1458   GFRNONAA >60 10/08/2020 0831   GFRAA >60 05/30/2020 0903    Assessment: 1.  History of adenomatous polyps: Last colonoscopy in 2018 and repeat recommended in 5 years, patient is overdue 2.  Constipation: Typically uses a half a dose of MiraLAX daily which works well for him  Plan: 1.  It looks like last procedure was done in the hospital given patient's BMI greater than 50 but he has lost some weight and his BMI is now 39.55.  We can do these procedures here in the [facility].  Did provide the patient a detailed list of risks for the procedure and he agrees to proceed.  Scheduled colonoscopy with Dr. Avram. 2.  Patient will have a 2-day bowel prep given history of constipation. 3.  Ordered some labs today as I cannot see any that have been done in the recent past.  Ordered CBC and CMP. 4.  Patient to follow in clinic with us  per recommendations after time of procedure.  Delon Failing, PA-C  [facility] 06/07/2024, 9:42 AM  Cc: Larnell Hamilton, MD   06/08/2024 9:32 AM  Norleen Schillings reviewed chart and approved patient for [facility].  Delon Failing, PA-C

## 2024-06-07 NOTE — Patient Instructions (Addendum)
 Your provider has requested that you go to the basement level for lab work before leaving today. Press B on the elevator. The lab is located at the first door on the left as you exit the elevator.   You have been scheduled for a colonoscopy. Please follow written instructions given to you at your visit today.   If you use inhalers (even only as needed), please bring them with you on the day of your procedure.  DO NOT TAKE 7 DAYS PRIOR TO TEST- Trulicity (dulaglutide) Ozempic, Wegovy (semaglutide) Mounjaro (tirzepatide) Bydureon Bcise (exanatide extended release)  DO NOT TAKE 1 DAY PRIOR TO YOUR TEST Rybelsus (semaglutide) Adlyxin (lixisenatide) Victoza (liraglutide) Byetta (exanatide) ___________________________________________________________________________  _______________________________________________________  If your blood pressure at your visit was 140/90 or greater, please contact your primary care physician to follow up on this.  _______________________________________________________  If you are age 58 or older, your body mass index should be between 23-30. Your Body mass index is 39.55 kg/m. If this is out of the aforementioned range listed, please consider follow up with your Primary Care Provider.  If you are age 61 or younger, your body mass index should be between 19-25. Your Body mass index is 39.55 kg/m. If this is out of the aformentioned range listed, please consider follow up with your Primary Care Provider.   ________________________________________________________  The [facility] providers would like to encourage you to use MYCHART to communicate with providers for non-urgent requests or questions.  Due to long hold times on the telephone, sending your provider a message by [facility] may be a faster and more efficient way to get a response.  Please allow 48 business hours for a response.  Please remember that this is for non-urgent requests.   _______________________________________________________

## 2024-06-08 ENCOUNTER — Other Ambulatory Visit: Payer: Self-pay | Admitting: *Deleted

## 2024-06-08 ENCOUNTER — Ambulatory Visit (INDEPENDENT_AMBULATORY_CARE_PROVIDER_SITE_OTHER)

## 2024-06-08 ENCOUNTER — Ambulatory Visit: Payer: Self-pay | Admitting: Physician Assistant

## 2024-06-08 DIAGNOSIS — R748 Abnormal levels of other serum enzymes: Secondary | ICD-10-CM

## 2024-06-08 LAB — GAMMA GT: GGT: 30 U/L (ref 7–51)

## 2024-06-11 ENCOUNTER — Telehealth (INDEPENDENT_AMBULATORY_CARE_PROVIDER_SITE_OTHER): Payer: Self-pay | Admitting: Physician Assistant

## 2024-06-11 ENCOUNTER — Ambulatory Visit: Payer: Self-pay | Admitting: Physician Assistant

## 2024-06-11 NOTE — Telephone Encounter (Signed)
 error

## 2024-07-09 ENCOUNTER — Telehealth: Payer: Self-pay | Admitting: Internal Medicine

## 2024-07-09 MED ORDER — NA SULFATE-K SULFATE-MG SULF 17.5-3.13-1.6 GM/177ML PO SOLN: 1.0000 | Freq: Once | ORAL | 0 refills | Status: AC

## 2024-07-09 NOTE — Telephone Encounter (Signed)
 Patient came in wrong day for colonoscopy requesting new prep medication   Please advise  Thank you

## 2024-07-09 NOTE — Telephone Encounter (Signed)
 The pt has been advised that a new prep has been sent to the pharmacy

## 2024-08-03 ENCOUNTER — Encounter: Payer: Self-pay | Admitting: Internal Medicine

## 2024-08-08 NOTE — Progress Notes (Unsigned)
 [facility] History and Physical   Primary Care Physician:  Larnell Hamilton, MD   Reason for Procedure:    Encounter Diagnosis  Name Primary?   History of adenomatous polyp of colon Yes     Plan:    Colonoscopy     HPI: Finnean Cerami. is a 68 y.o. male with a history of removal of a diminutive adenoma at colonoscopy in 2018.  Also had diverticulosis on that exam, was having problems with colovesical fistula and subsequently had a colon resection.    Past Medical History:  Diagnosis Date   Arthritis    Cancer (HCC)    kidney   Chronic kidney disease    mass r kidney - partial nephrectomy   COPD (chronic obstructive pulmonary disease) (HCC)    Diverticulitis    History of kidney stones    Hx of adenomatous colonic polyps 06/09/2015   Hyperlipidemia    Hypertension    has previously been on medication but then taken off - 05/29/20 pt states he's never been treated for HTN   Neuromuscular disorder (HCC)    neuropathy lower legs and feet   Neuropathy    in both feet   Pneumonia 2011    Past Surgical History:  Procedure Laterality Date   AMPUTATION Bilateral 06/05/2013   Procedure: RIGHT GREAT TOE AMPUTATION/LEFT GREAT TOE DEBRIDEMENT;  Surgeon: Norleen Armor, MD;  Location: [facility] OR;  Service: Orthopedics;  Laterality: Bilateral;   AMPUTATION Left 05/30/2020   Procedure: LEFT TRANSMETATARSAL AMPUTATION;  Surgeon: Harden Jerona GAILS, MD;  Location: [facility] OR;  Service: Orthopedics;  Laterality: Left;   APPLICATION OF WOUND VAC Left 05/30/2020   Procedure: APPLICATION OF WOUND VAC;  Surgeon: Harden Jerona GAILS, MD;  Location: [facility] OR;  Service: Orthopedics;  Laterality: Left;   COLON SURGERY     robotic partial colectomy Dr. Debby 12-30-17   COLONOSCOPY     x 2 last date 09/21/2017 bladder attached to colon eval   COLONOSCOPY WITH PROPOFOL  N/A 09/21/2017   Procedure: COLONOSCOPY WITH PROPOFOL ;  Surgeon: Avram Lupita FORBES, MD;  Location: [facility];  Service: Endoscopy;   Laterality: N/A;   MOUTH SURGERY     PARTIAL NEPHRECTOMY     right Dr. Bernarda Debby 12-30-17   ROBOTIC ASSITED PARTIAL NEPHRECTOMY Right 12/30/2017   Procedure: XI ROBOTIC ASSITED RIGHT PARTIAL NEPHRECTOMY;  Surgeon: Alvaro Hummer, MD;  Location: [facility];  Service: Urology;  Laterality: Right;   STUMP REVISION Left 10/08/2020   Procedure: REVISION LEFT TRANSMETATARSAL AMPUTATION;  Surgeon: Harden Jerona GAILS, MD;  Location: [facility] OR;  Service: Orthopedics;  Laterality: Left;   TOE AMPUTATION Right 06/05/2013   RIGHT GREAT TOE PARTIAL AMPUTATION     TOOTH EXTRACTION  2011   multiple teeth extracted     Current Outpatient Medications  Medication Sig Dispense Refill   atorvastatin  (LIPITOR) 10 MG tablet Take 10 mg by mouth daily.   5   diclofenac Sodium (VOLTAREN) 1 % GEL Apply 1 application topically 4 (four) times daily as needed (pain.).     docusate sodium  (COLACE) 50 MG capsule Take 50 mg by mouth daily as needed for mild constipation.     doxycycline  (VIBRA -TABS) 100 MG tablet Take 1 tablet (100 mg total) by mouth 2 (two) times daily. 60 tablet 0   DULoxetine  (CYMBALTA ) 60 MG capsule Take 60 mg by mouth daily.      oxyCODONE  (OXY IR/ROXICODONE ) 5 MG immediate release tablet Take 1 tablet (5 mg total) by mouth  every 6 (six) hours as needed for moderate pain (pain score 4-6). 20 tablet 0   pregabalin  (LYRICA ) 150 MG capsule Take 1 capsule (150 mg total) by mouth 3 (three) times daily. (Patient taking differently: Take 300 mg by mouth in the morning and at bedtime. ) 90 capsule 5   PROAIR  HFA 108 (90 Base) MCG/ACT inhaler Inhale 2 puffs into the lungs every 6 (six) hours as needed (for wheezing/shortness of breath.).      SYMBICORT 160-4.5 MCG/ACT inhaler Inhale 2 puffs into the lungs 2 (two) times daily.      tamsulosin  (FLOMAX ) 0.4 MG CAPS capsule Take 0.4 mg by mouth 2 (two) times daily.      varenicline  (CHANTIX ) 1 MG tablet Take 1 mg by mouth 2 (two) times daily.     No current  facility-administered medications for this visit.    Allergies as of 08/09/2024   (No Known Allergies)    Family History  Problem Relation Age of Onset   Heart disease Mother    Esophageal cancer Mother    Diabetes Father    Pancreatic cancer Father    Breast cancer Sister    Liver disease Neg Hx     Social History   Socioeconomic History   Marital status: Single    Spouse name: Not on file   Number of children: 1   Years of education: Not on file   Highest education level: Not on file  Occupational History   Occupation: retired  Tobacco Use   Smoking status: Some Days    Current packs/day: 0.20    Average packs/day: 0.2 packs/day for 50.7 years (10.1 ttl pk-yrs)    Types: Cigarettes    Start date: 11/29/1973   Smokeless tobacco: Never   Tobacco comments:    occasional smoker - ~1 pack/week. smoked ~1.5ppd until 5 yr ago  Vaping Use   Vaping status: Never Used  Substance and Sexual Activity   Alcohol  use: No    Alcohol /week: 0.0 standard drinks of alcohol    Drug use: No   Sexual activity: Not Currently    Partners: Female  Other Topics Concern   Not on file  Social History Narrative   Not on file   Social Drivers of Health   Financial Resource Strain: Not on file  Food Insecurity: Not on file  Transportation Needs: Not on file  Physical Activity: Not on file  Stress: Not on file  Social Connections: Not on file  Intimate Partner Violence: Not on file    Review of Systems: Positive for *** All other review of systems negative except as mentioned in the HPI.  Physical Exam: Vital signs There were no vitals taken for this visit.  General:   Alert,  Well-developed, well-nourished, pleasant and cooperative in NAD Lungs:  Clear throughout to auscultation.   Heart:  Regular rate and rhythm; no murmurs, clicks, rubs,  or gallops. Abdomen:  Soft, nontender and nondistended. Normal bowel sounds.   Neuro/Psych:  Alert and cooperative. Normal mood and affect.  A and O x 3   @Penni Penado  CHARLENA Commander, MD, [facility] [phone] (pager) 08/08/2024 4:56 PM@

## 2024-08-09 ENCOUNTER — Ambulatory Visit: Admitting: Internal Medicine

## 2024-08-09 ENCOUNTER — Encounter: Admitting: Internal Medicine

## 2024-08-09 ENCOUNTER — Encounter: Payer: Self-pay | Admitting: Internal Medicine

## 2024-08-09 VITALS — BP 110/67 | HR 90 | Temp 96.7°F | Resp 13 | Ht >= 80 in | Wt 360.0 lb

## 2024-08-09 DIAGNOSIS — D123 Benign neoplasm of transverse colon: Secondary | ICD-10-CM

## 2024-08-09 DIAGNOSIS — Z1211 Encounter for screening for malignant neoplasm of colon: Secondary | ICD-10-CM | POA: Diagnosis not present

## 2024-08-09 DIAGNOSIS — K621 Rectal polyp: Secondary | ICD-10-CM

## 2024-08-09 DIAGNOSIS — K648 Other hemorrhoids: Secondary | ICD-10-CM

## 2024-08-09 DIAGNOSIS — D120 Benign neoplasm of cecum: Secondary | ICD-10-CM

## 2024-08-09 DIAGNOSIS — Z860101 Personal history of adenomatous and serrated colon polyps: Secondary | ICD-10-CM

## 2024-08-09 DIAGNOSIS — D128 Benign neoplasm of rectum: Secondary | ICD-10-CM

## 2024-08-09 DIAGNOSIS — K5730 Diverticulosis of large intestine without perforation or abscess without bleeding: Secondary | ICD-10-CM

## 2024-08-09 MED ORDER — SODIUM CHLORIDE 0.9 % IV SOLN
500.0000 mL | Freq: Once | INTRAVENOUS | Status: DC
Start: 2024-08-09 — End: 2024-08-09

## 2024-08-09 NOTE — Op Note (Signed)
 [facility] Patient Name: [name] Procedure Date: 08/09/2024 10:48 AM MRN: [id number] Endoscopist: Lupita FORBES Commander , MD, [id number] Age: 68 Referring MD:  Date of Birth: 03-Jan-1956 Gender: Male Account #: [id number] Procedure:                Colonoscopy Indications:              Surveillance: Personal history of adenomatous                            polyps on last colonoscopy > 5 years ago, Last                            colonoscopy: 2018 Medicines:                Monitored Anesthesia Care Procedure:                Pre-Anesthesia Assessment:                           - Prior to the procedure, a History and Physical                            was performed, and patient medications and                            allergies were reviewed. The patient's tolerance of                            previous anesthesia was also reviewed. The risks                            and benefits of the procedure and the sedation                            options and risks were discussed with the patient.                            All questions were answered, and informed consent                            was obtained. Prior Anticoagulants: The patient has                            taken no anticoagulant or antiplatelet agents. ASA                            Grade Assessment: III - A patient with severe                            systemic disease. After reviewing the risks and                            benefits, the patient was deemed in satisfactory  condition to undergo the procedure.                           After obtaining informed consent, the colonoscope                            was passed under direct vision. Throughout the                            procedure, the patient's blood pressure, pulse, and                            oxygen  saturations were monitored continuously. The                            Olympus Scope SN: [id number] was introduced  through                            the anus and advanced to the the cecum, identified                            by appendiceal orifice and ileocecal valve. The                            colonoscopy was performed with moderate difficulty                            due to inadequate bowel prep. The patient tolerated                            the procedure well. The quality of the bowel                            preparation was fair. The ileocecal valve,                            appendiceal orifice, and rectum were photographed.                            The bowel preparation used was MiraLAx and SUPREP                            via extended prep with split dose instruction. Scope In: 11:08:03 AM Scope Out: 11:44:48 AM Scope Withdrawal Time: 0 hours 33 minutes 2 seconds  Total Procedure Duration: 0 hours 36 minutes 45 seconds  Findings:                 The perianal and digital rectal examinations were                            normal.                           A 20 mm polyp was found in the transverse colon.  The polyp was sessile. The polyp was removed with a                            piecemeal technique using a cold snare. Resection                            and retrieval were complete. Verification of                            patient identification for the specimen was done.                            Estimated blood loss was minimal. Area was tattooed                            with an injection of 2 mL of Spot (carbon black).                           A 2 mm polyp was found in the cecum. The polyp was                            sessile. The polyp was removed with a cold snare.                            Resection and retrieval were complete. Verification                            of patient identification for the specimen was                            done. Estimated blood loss was minimal.                           A 6 mm polyp was found in the  distal rectum. The                            polyp was sessile. The polyp was removed with a                            cold snare. Resection and retrieval were complete.                            Verification of patient identification for the                            specimen was done. I treated site with soft tip                            coagulation due to persistent ooze of blood.                            Estimated blood loss was minimal.  Multiple diverticula were found in the sigmoid                            colon.                           Internal hemorrhoids were found.                           The exam was otherwise without abnormality on                            direct and retroflexion views. Complications:            No immediate complications. Estimated Blood Loss:     Estimated blood loss was minimal. Impression:               - Preparation of the colon was fair.                           - One 20 mm polyp in the transverse colon, removed                            piecemeal using a cold snare. Resected and                            retrieved. Tattooed.                           - One 2 mm polyp in the cecum, removed with a cold                            snare. Resected and retrieved.                           - One 6 mm polyp in the distal rectum, removed with                            a cold snare. Resected and retrieved. Site treated                            w/ soft tip coagulation.                           - Diverticulosis in the sigmoid colon.                           - Internal hemorrhoids.                           - The examination was otherwise normal on direct                            and retroflexion views. I did see site of prior  segmental colectomy anastomosis in distal sigmoid                            also - healthy end-end.                           - Personal history of colonic  polyps. Recommendation:           - Patient has a contact number available for                            emergencies. The signs and symptoms of potential                            delayed complications were discussed with the                            patient. Return to normal activities tomorrow.                            Written discharge instructions were provided to the                            patient.                           - Resume previous diet.                           - Continue present medications.                           - Await pathology results.                           - - schedule office visit with me November so we                            can address constipation and plan for colonoscopy                            again due to fair prep + large polyp follow-up - I                            think will need something like Linzess daily a week                            before and double prep                           - increase MiraLax to full dose dfaily                           - No aspirin, ibuprofen, naproxen, or other  non-steroidal anti-inflammatory drugs for 2 weeks                            after polyp removal. Lupita FORBES Commander, MD 08/09/2024 12:06:06 PM This report has been signed electronically.

## 2024-08-09 NOTE — Progress Notes (Signed)
 A/o x 3, VSS, gd SR's, pleased with anesthesia, report to RN

## 2024-08-09 NOTE — Patient Instructions (Addendum)
   I removed 3 polyps. You had Diverticulosis and Internal Hemorrhoids.   Need to repeat the colonoscopy in 6 months to check that large polyp removal site the clean out wasn't so good.   Increase Miralax to full dose daily.  No aspirin products for 2 weeks.      Lupita CHARLENA Commander, MD, [facility]  Please see handouts given to you on Polyp, Diverticulosis and Hemorrhoids.  YOU HAD AN ENDOSCOPIC PROCEDURE TODAY AT THE [facility]:   Refer to the procedure report that was given to you for any specific questions about what was found during the examination.  If the procedure report does not answer your questions, please call your gastroenterologist to clarify.  If you requested that your care partner not be given the details of your procedure findings, then the procedure report has been included in a sealed envelope for you to review at your convenience later.  YOU SHOULD EXPECT: Some feelings of bloating in the abdomen. Passage of more gas than usual.  Walking can help get rid of the air that was put into your GI tract during the procedure and reduce the bloating. If you had a lower endoscopy (such as a colonoscopy or flexible sigmoidoscopy) you may notice spotting of blood in your stool or on the toilet paper. If you underwent a bowel prep for your procedure, you may not have a normal bowel movement for a few days.  Please Note:  You might notice some irritation and congestion in your nose or some drainage.  This is from the oxygen  used during your procedure.  There is no need for concern and it should clear up in a day or so.  SYMPTOMS TO REPORT IMMEDIATELY:  Following lower endoscopy (colonoscopy or flexible sigmoidoscopy):  Excessive amounts of blood in the stool  Significant tenderness or worsening of abdominal pains  Swelling of the abdomen that is new, acute  Fever of 100F or higher   For urgent or emergent issues, a gastroenterologist can be reached at any hour by calling  [phone]. Do not use MyChart messaging for urgent concerns.    DIET:  We do recommend a small meal at first, but then you may proceed to your regular diet.  Drink plenty of fluids but you should avoid alcoholic beverages for 24 hours.  ACTIVITY:  You should plan to take it easy for the rest of today and you should NOT DRIVE or use heavy machinery until tomorrow (because of the sedation medicines used during the test).    FOLLOW UP: Our staff will call the number listed on your records the next business day following your procedure.  We will call around 7:15- 8:00 am to check on you and address any questions or concerns that you may have regarding the information given to you following your procedure. If we do not reach you, we will leave a message.     If any biopsies were taken you will be contacted by phone or by letter within the next 1-3 weeks.  Please call us  at [phone] if you have not heard about the biopsies in 3 weeks.    SIGNATURES/CONFIDENTIALITY: You and/or your care partner have signed paperwork which will be entered into your electronic medical record.  These signatures attest to the fact that that the information above on your After Visit Summary has been reviewed and is understood.  Full responsibility of the confidentiality of this discharge information lies with you and/or your care-partner.

## 2024-08-09 NOTE — Progress Notes (Signed)
 Called to room to assist during endoscopic procedure.  Patient ID and intended procedure confirmed with present staff. Received instructions for my participation in the procedure from the performing physician.

## 2024-08-10 ENCOUNTER — Telehealth: Payer: Self-pay

## 2024-08-10 NOTE — Telephone Encounter (Signed)
 LMOM

## 2024-08-14 LAB — SURGICAL PATHOLOGY

## 2024-08-16 ENCOUNTER — Ambulatory Visit: Payer: Self-pay | Admitting: Internal Medicine

## 2024-10-19 ENCOUNTER — Ambulatory Visit: Admitting: Internal Medicine

## 2024-11-09 ENCOUNTER — Ambulatory Visit
Admission: RE | Admit: 2024-11-09 | Discharge: 2024-11-09 | Disposition: A | Source: Ambulatory Visit | Attending: Acute Care | Admitting: Acute Care

## 2024-11-09 DIAGNOSIS — R911 Solitary pulmonary nodule: Secondary | ICD-10-CM

## 2024-11-15 ENCOUNTER — Other Ambulatory Visit: Payer: Self-pay | Admitting: Acute Care

## 2024-11-15 DIAGNOSIS — F17210 Nicotine dependence, cigarettes, uncomplicated: Secondary | ICD-10-CM

## 2024-11-15 DIAGNOSIS — Z87891 Personal history of nicotine dependence: Secondary | ICD-10-CM

## 2024-11-15 DIAGNOSIS — Z122 Encounter for screening for malignant neoplasm of respiratory organs: Secondary | ICD-10-CM

## 2024-12-05 ENCOUNTER — Ambulatory Visit: Admitting: Internal Medicine

## 2024-12-05 ENCOUNTER — Encounter: Payer: Self-pay | Admitting: Internal Medicine

## 2024-12-05 VITALS — BP 144/76 | HR 85 | Ht >= 80 in | Wt 355.0 lb

## 2024-12-05 DIAGNOSIS — K5909 Other constipation: Secondary | ICD-10-CM

## 2024-12-05 DIAGNOSIS — Z860101 Personal history of adenomatous and serrated colon polyps: Secondary | ICD-10-CM

## 2024-12-05 MED ORDER — NA SULFATE-K SULFATE-MG SULF 17.5-3.13-1.6 GM/177ML PO SOLN: 1.0000 | ORAL | 0 refills | Status: AC

## 2024-12-05 NOTE — Patient Instructions (Addendum)
 You have been scheduled for a colonoscopy. Please follow written instructions given to you at your visit today.   If you use inhalers (even only as needed), please bring them with you on the day of your procedure.  DO NOT TAKE 7 DAYS PRIOR TO TEST- Trulicity (dulaglutide) Ozempic, Wegovy (semaglutide) Mounjaro, Zepbound (tirzepatide) Bydureon Bcise (exanatide extended release)  DO NOT TAKE 1 DAY PRIOR TO YOUR TEST Rybelsus (semaglutide) Adlyxin (lixisenatide) Victoza (liraglutide) Byetta (exanatide) ___________________________________________________________________________  Take one  Linzess 290 mcg on Feb 22, Feb 23, Feb 24th. Take first thing in the AM on an empty stomach. Samples provided for this.   I appreciate the opportunity to care for you. Lupita Commander, MD, [facility]

## 2024-12-05 NOTE — Progress Notes (Signed)
 "       [name]. 69 y.o. Jul 20, 1956 [id number]  Assessment & Plan:   Encounter Diagnoses  Name Primary?   Hx of adenomatous colonic polyps Yes   Chronic constipation        Increased risk for residual or missed polyps due to prior suboptimal bowel preparation and previously removed large adenomatous polyp. Repeat colonoscopy indicated within a year for optimal surveillance, and fdor closer follow-up after large polypectomy. - Schedule colonoscopy - Plan to use MiraLAx and SuPrep double prep,+ 3 days of Linzess, 290ug - Instructed use of Linaclotide (Linzess) for several days prior to colonoscopy to enhance bowel movements and improve preparation quality. - Provided education on the importance of adequate bowel preparation for optimal visualization and polyp detection.   Chronic constipation with recent worsening possibly related to increased semaglutide (Ozempic) dose, with persistent hard stools and suboptimal frequency despite intermittent use of polyethylene glycol (Miralax) and bisacodyl  (Dulcolax). - Advised resuming daily polyethylene glycol (Miralax), increasing to two doses per day if hard stools persist. - Recommended bisacodyl  (Dulcolax) every two days if no bowel movement occurs for two days. - Instructed to increase daily water intake to at least 48 ounces. - Provided anticipatory guidance regarding potential for significant bowel movements with Linaclotide and advised administration on an empty stomach in the morning.  - Discussed dietary modifications for better health, specifically reducing sugar intake in tea and other beverages  The risks and benefits as well as alternatives of endoscopic procedure(s) have been discussed and reviewed. All questions answered. The patient agrees to proceed.   Subjective:   Chief Complaint: history of colon polyps and   HPI Discussed the use of AI scribe software for clinical note transcription with the patient, who gave  verbal consent to proceed.  [name] is a 69 year old male with adenomatous colonic polyps and chronic constipation who presents for evaluation regarding repeat colonoscopy due to suboptimal bowel preparation and concern for residual polyp.  Last colonoscopy 08/09/2024  - Preparation of the colon was fair. - One 20 mm polyp in the transverse colon, removed piecemeal using a cold snare. Resected and retrieved. ADENOMA Tattooed. - One 2 mm polyp in the cecum, removed with a cold snare.ADENOMA Resected and retrieved. - One 6 mm polyp in the distal rectum, removed with a cold snare. HYPERPLASTIC Resected and retrieved. Site treated w/ soft tip coagulation. - Diverticulosis in the sigmoid colon. - Internal hemorrhoids. - The examination was otherwise normal on direct and retroflexion views. I did see site of prior segmental colectomy anastomosis in distal sigmoid also - healthy end-end. - Personal history of colonic polyps.    Colonic polyps and colonoscopy preparation - Underwent colonoscopy within the past year with removal of a large polyp and others. - Most recent colonoscopy utilized Miralax and Suprep for bowel preparation. - Suprep was ingested despite unpleasant taste, described as terrible. - Bowel preparation results were inferior compared to prior cleanout, stating it didn't work and didn't give the results that the last one did. - Previously used Suprep in 2018 but does not recall the experience. - Concern for missed polyps due to suboptimal bowel preparation.  Chronic constipation and bowel habits - Bowel movements generally occur once daily, recently decreased to once every other day. - Intermittent use of Ozempic for the past year with recent dose increase, believed to contribute to decreased bowel frequency. - Uses Miralax daily, but has not taken it for the past  2-3 days due to running out. - Uses Dulcolax every other day as needed, which is effective in  inducing bowel movement late at night. - Does not currently use Colace but is familiar with it. - Stools remain hard in consistency. - Suboptimal fluid intake, estimated at probably half of what I need to drink, with tea (with sugar and lemon) as preferred beverage.  Abdominal symptoms - No sensation of being backed up. - No other significant gastrointestinal symptoms. - Mild abdominal aching occurs prior to bowel movement and resolves after defecation.      Allergies[1] Active Medications[2] Past Medical History:  Diagnosis Date   Arthritis    Cancer (HCC)    kidney   Chronic kidney disease    mass r kidney - partial nephrectomy   COPD (chronic obstructive pulmonary disease) (HCC)    Diverticulitis    History of kidney stones    Hx of adenomatous colonic polyps 06/09/2015   Hyperlipidemia    Hypertension    has previously been on medication but then taken off - 05/29/20 pt states he's never been treated for HTN   Neuromuscular disorder (HCC)    neuropathy lower legs and feet   Neuropathy    in both feet   Pneumonia 2011   Past Surgical History:  Procedure Laterality Date   AMPUTATION Bilateral 06/05/2013   Procedure: RIGHT GREAT TOE AMPUTATION/LEFT GREAT TOE DEBRIDEMENT;  Surgeon: Norleen Armor, MD;  Location: [facility] OR;  Service: Orthopedics;  Laterality: Bilateral;   AMPUTATION Left 05/30/2020   Procedure: LEFT TRANSMETATARSAL AMPUTATION;  Surgeon: Harden Jerona GAILS, MD;  Location: [facility] OR;  Service: Orthopedics;  Laterality: Left;   APPLICATION OF WOUND VAC Left 05/30/2020   Procedure: APPLICATION OF WOUND VAC;  Surgeon: Harden Jerona GAILS, MD;  Location: [facility] OR;  Service: Orthopedics;  Laterality: Left;   COLON SURGERY     robotic partial colectomy Dr. Debby 12-30-17   COLONOSCOPY     x 2 last date 09/21/2017 bladder attached to colon eval   COLONOSCOPY WITH PROPOFOL  N/A 09/21/2017   Procedure: COLONOSCOPY WITH PROPOFOL ;  Surgeon: Avram Lupita BRAVO, MD;  Location: [facility];  Service:  Endoscopy;  Laterality: N/A;   MOUTH SURGERY     PARTIAL NEPHRECTOMY     right Dr. Bernarda Debby 12-30-17   ROBOTIC ASSITED PARTIAL NEPHRECTOMY Right 12/30/2017   Procedure: XI ROBOTIC ASSITED RIGHT PARTIAL NEPHRECTOMY;  Surgeon: Alvaro Hummer, MD;  Location: [facility] ORS;  Service: Urology;  Laterality: Right;   STUMP REVISION Left 10/08/2020   Procedure: REVISION LEFT TRANSMETATARSAL AMPUTATION;  Surgeon: Harden Jerona GAILS, MD;  Location: [facility] OR;  Service: Orthopedics;  Laterality: Left;   TOE AMPUTATION Right 06/05/2013   RIGHT GREAT TOE PARTIAL AMPUTATION     TOOTH EXTRACTION  2011   multiple teeth extracted   Social History   Social History Narrative   Single   Lives alone   Retired software engineer   Occasional smoker, no alcohol  or drugs   family history includes Breast cancer in his sister; Diabetes in his father; Esophageal cancer in his mother; Heart disease in his mother; Pancreatic cancer in his father.   Review of Systems As per HPI  Objective:   Physical Exam BP (!) 144/76   Pulse 85   Ht 6' 8 (2.032 m)   Wt (!) 355 lb (161 kg)   BMI 39.00 kg/m      [1] No Known Allergies [2]  Current Meds  Medication Sig   aspirin EC  81 MG tablet Take 81 mg by mouth daily.   atorvastatin  (LIPITOR) 10 MG tablet Take 10 mg by mouth daily.    bisacodyl  (DULCOLAX) 5 MG EC tablet Take 5 mg by mouth daily as needed for moderate constipation.   diclofenac Sodium (VOLTAREN) 1 % GEL Apply 1 application topically 4 (four) times daily as needed (pain.).   docusate sodium  (COLACE) 50 MG capsule Take 50 mg by mouth daily as needed for mild constipation.   DULoxetine  (CYMBALTA ) 60 MG capsule Take 60 mg by mouth daily.    OZEMPIC, 1 MG/DOSE, 4 MG/3ML SOPN once a week.   Polyethylene Glycol 3350 (MIRALAX PO) Take 17 g by mouth daily.   pregabalin  (LYRICA ) 150 MG capsule Take 1 capsule (150 mg total) by mouth 3 (three) times daily. (Patient taking differently: Take 300 mg by mouth in the morning  and at bedtime.)   PROAIR  HFA 108 (90 Base) MCG/ACT inhaler Inhale 2 puffs into the lungs every 6 (six) hours as needed (for wheezing/shortness of breath.).    SYMBICORT 160-4.5 MCG/ACT inhaler Inhale 2 puffs into the lungs 2 (two) times daily.    "

## 2025-01-25 ENCOUNTER — Encounter: Admitting: Internal Medicine
